# Patient Record
Sex: Male | Born: 1935
Health system: Southern US, Community
[De-identification: ages and names within clinical notes are randomized; demographics above are authoritative.]

## PROBLEM LIST (undated history)

## (undated) DIAGNOSIS — E785 Hyperlipidemia, unspecified: Secondary | ICD-10-CM

## (undated) DIAGNOSIS — I10 Essential (primary) hypertension: Secondary | ICD-10-CM

## (undated) DIAGNOSIS — N183 Chronic kidney disease, stage 3 unspecified: Secondary | ICD-10-CM

## (undated) DIAGNOSIS — N390 Urinary tract infection, site not specified: Secondary | ICD-10-CM

## (undated) DIAGNOSIS — C49A4 Gastrointestinal stromal tumor of large intestine: Secondary | ICD-10-CM

## (undated) DIAGNOSIS — R578 Other shock: Secondary | ICD-10-CM

## (undated) DIAGNOSIS — J9601 Acute respiratory failure with hypoxia: Secondary | ICD-10-CM

## (undated) DIAGNOSIS — R0602 Shortness of breath: Secondary | ICD-10-CM

## (undated) DIAGNOSIS — R1900 Intra-abdominal and pelvic swelling, mass and lump, unspecified site: Secondary | ICD-10-CM

## (undated) DIAGNOSIS — I2510 Atherosclerotic heart disease of native coronary artery without angina pectoris: Secondary | ICD-10-CM

## (undated) DIAGNOSIS — I214 Non-ST elevation (NSTEMI) myocardial infarction: Secondary | ICD-10-CM

## (undated) DIAGNOSIS — C49A0 Gastrointestinal stromal tumor, unspecified site: Secondary | ICD-10-CM

## (undated) DIAGNOSIS — K661 Hemoperitoneum: Secondary | ICD-10-CM

## (undated) DIAGNOSIS — J189 Pneumonia, unspecified organism: Secondary | ICD-10-CM

## (undated) HISTORY — DX: Gastrointestinal stromal tumor, unspecified site: C49.A0

## (undated) HISTORY — DX: Intra-abdominal and pelvic swelling, mass and lump, unspecified site: R19.00

## (undated) HISTORY — DX: Pneumonia, unspecified organism: J18.9

## (undated) HISTORY — DX: Atherosclerotic heart disease of native coronary artery without angina pectoris: I25.10

## (undated) HISTORY — DX: Non-ST elevation (NSTEMI) myocardial infarction: I21.4

## (undated) HISTORY — DX: Urinary tract infection, site not specified: N39.0

---

## 1960-12-19 HISTORY — PX: CYST EXCISION: SHX5701

## 2001-03-01 ENCOUNTER — Emergency Department (HOSPITAL_COMMUNITY): Admission: EM | Admit: 2001-03-01 | Discharge: 2001-03-01 | Payer: Self-pay | Admitting: Emergency Medicine

## 2001-03-26 ENCOUNTER — Encounter: Payer: Self-pay | Admitting: Pediatrics

## 2001-03-26 ENCOUNTER — Encounter: Payer: Self-pay | Admitting: Emergency Medicine

## 2001-03-26 ENCOUNTER — Inpatient Hospital Stay (HOSPITAL_COMMUNITY): Admission: EM | Admit: 2001-03-26 | Discharge: 2001-03-28 | Payer: Self-pay | Admitting: Emergency Medicine

## 2001-03-27 ENCOUNTER — Encounter: Payer: Self-pay | Admitting: Pediatrics

## 2012-02-15 DIAGNOSIS — Z1331 Encounter for screening for depression: Secondary | ICD-10-CM | POA: Diagnosis not present

## 2012-02-15 DIAGNOSIS — N529 Male erectile dysfunction, unspecified: Secondary | ICD-10-CM | POA: Diagnosis not present

## 2012-02-15 DIAGNOSIS — I10 Essential (primary) hypertension: Secondary | ICD-10-CM | POA: Diagnosis not present

## 2012-02-15 DIAGNOSIS — E782 Mixed hyperlipidemia: Secondary | ICD-10-CM | POA: Diagnosis not present

## 2012-05-31 DIAGNOSIS — H2510 Age-related nuclear cataract, unspecified eye: Secondary | ICD-10-CM | POA: Diagnosis not present

## 2012-06-13 DIAGNOSIS — H2510 Age-related nuclear cataract, unspecified eye: Secondary | ICD-10-CM | POA: Diagnosis not present

## 2012-06-20 DIAGNOSIS — H2510 Age-related nuclear cataract, unspecified eye: Secondary | ICD-10-CM | POA: Diagnosis not present

## 2012-06-20 DIAGNOSIS — H52209 Unspecified astigmatism, unspecified eye: Secondary | ICD-10-CM | POA: Diagnosis not present

## 2012-07-04 DIAGNOSIS — H2510 Age-related nuclear cataract, unspecified eye: Secondary | ICD-10-CM | POA: Diagnosis not present

## 2012-08-06 DIAGNOSIS — E782 Mixed hyperlipidemia: Secondary | ICD-10-CM | POA: Diagnosis not present

## 2012-08-06 DIAGNOSIS — I10 Essential (primary) hypertension: Secondary | ICD-10-CM | POA: Diagnosis not present

## 2012-11-05 DIAGNOSIS — Z961 Presence of intraocular lens: Secondary | ICD-10-CM | POA: Diagnosis not present

## 2012-11-21 DIAGNOSIS — L280 Lichen simplex chronicus: Secondary | ICD-10-CM | POA: Diagnosis not present

## 2012-11-21 DIAGNOSIS — D235 Other benign neoplasm of skin of trunk: Secondary | ICD-10-CM | POA: Diagnosis not present

## 2012-11-21 DIAGNOSIS — C44519 Basal cell carcinoma of skin of other part of trunk: Secondary | ICD-10-CM | POA: Diagnosis not present

## 2012-11-21 DIAGNOSIS — L570 Actinic keratosis: Secondary | ICD-10-CM | POA: Diagnosis not present

## 2013-01-01 DIAGNOSIS — Z85828 Personal history of other malignant neoplasm of skin: Secondary | ICD-10-CM | POA: Diagnosis not present

## 2013-02-04 DIAGNOSIS — I10 Essential (primary) hypertension: Secondary | ICD-10-CM | POA: Diagnosis not present

## 2013-02-04 DIAGNOSIS — E782 Mixed hyperlipidemia: Secondary | ICD-10-CM | POA: Diagnosis not present

## 2013-02-16 DIAGNOSIS — I214 Non-ST elevation (NSTEMI) myocardial infarction: Secondary | ICD-10-CM

## 2013-02-16 DIAGNOSIS — R1900 Intra-abdominal and pelvic swelling, mass and lump, unspecified site: Secondary | ICD-10-CM

## 2013-02-16 HISTORY — DX: Non-ST elevation (NSTEMI) myocardial infarction: I21.4

## 2013-02-16 HISTORY — DX: Intra-abdominal and pelvic swelling, mass and lump, unspecified site: R19.00

## 2013-02-27 ENCOUNTER — Emergency Department (HOSPITAL_COMMUNITY): Payer: Medicare Other

## 2013-02-27 ENCOUNTER — Inpatient Hospital Stay (HOSPITAL_COMMUNITY)
Admission: EM | Admit: 2013-02-27 | Discharge: 2013-03-06 | DRG: 280 | Disposition: A | Discharge status: Home / Self Care | Payer: Medicare Other | Attending: Internal Medicine | Admitting: Internal Medicine

## 2013-02-27 ENCOUNTER — Encounter (HOSPITAL_COMMUNITY): Payer: Self-pay | Admitting: Emergency Medicine

## 2013-02-27 DIAGNOSIS — I959 Hypotension, unspecified: Secondary | ICD-10-CM | POA: Diagnosis present

## 2013-02-27 DIAGNOSIS — Z23 Encounter for immunization: Secondary | ICD-10-CM

## 2013-02-27 DIAGNOSIS — R509 Fever, unspecified: Secondary | ICD-10-CM | POA: Diagnosis not present

## 2013-02-27 DIAGNOSIS — N289 Disorder of kidney and ureter, unspecified: Secondary | ICD-10-CM | POA: Diagnosis present

## 2013-02-27 DIAGNOSIS — N390 Urinary tract infection, site not specified: Secondary | ICD-10-CM | POA: Diagnosis not present

## 2013-02-27 DIAGNOSIS — E785 Hyperlipidemia, unspecified: Secondary | ICD-10-CM | POA: Diagnosis present

## 2013-02-27 DIAGNOSIS — R1903 Right lower quadrant abdominal swelling, mass and lump: Secondary | ICD-10-CM | POA: Diagnosis present

## 2013-02-27 DIAGNOSIS — N179 Acute kidney failure, unspecified: Secondary | ICD-10-CM | POA: Diagnosis present

## 2013-02-27 DIAGNOSIS — I214 Non-ST elevation (NSTEMI) myocardial infarction: Principal | ICD-10-CM | POA: Diagnosis present

## 2013-02-27 DIAGNOSIS — K319 Disease of stomach and duodenum, unspecified: Secondary | ICD-10-CM | POA: Diagnosis not present

## 2013-02-27 DIAGNOSIS — I2510 Atherosclerotic heart disease of native coronary artery without angina pectoris: Secondary | ICD-10-CM | POA: Diagnosis present

## 2013-02-27 DIAGNOSIS — I10 Essential (primary) hypertension: Secondary | ICD-10-CM | POA: Diagnosis present

## 2013-02-27 DIAGNOSIS — S298XXA Other specified injuries of thorax, initial encounter: Secondary | ICD-10-CM | POA: Diagnosis not present

## 2013-02-27 DIAGNOSIS — R55 Syncope and collapse: Secondary | ICD-10-CM | POA: Diagnosis not present

## 2013-02-27 DIAGNOSIS — R1909 Other intra-abdominal and pelvic swelling, mass and lump: Secondary | ICD-10-CM | POA: Diagnosis not present

## 2013-02-27 DIAGNOSIS — I517 Cardiomegaly: Secondary | ICD-10-CM | POA: Diagnosis not present

## 2013-02-27 DIAGNOSIS — R42 Dizziness and giddiness: Secondary | ICD-10-CM | POA: Diagnosis not present

## 2013-02-27 DIAGNOSIS — Z79899 Other long term (current) drug therapy: Secondary | ICD-10-CM

## 2013-02-27 DIAGNOSIS — Z7982 Long term (current) use of aspirin: Secondary | ICD-10-CM | POA: Diagnosis not present

## 2013-02-27 DIAGNOSIS — R1900 Intra-abdominal and pelvic swelling, mass and lump, unspecified site: Secondary | ICD-10-CM | POA: Diagnosis not present

## 2013-02-27 DIAGNOSIS — R404 Transient alteration of awareness: Secondary | ICD-10-CM | POA: Diagnosis not present

## 2013-02-27 DIAGNOSIS — J189 Pneumonia, unspecified organism: Secondary | ICD-10-CM | POA: Diagnosis present

## 2013-02-27 DIAGNOSIS — D649 Anemia, unspecified: Secondary | ICD-10-CM | POA: Diagnosis present

## 2013-02-27 DIAGNOSIS — R7989 Other specified abnormal findings of blood chemistry: Secondary | ICD-10-CM | POA: Diagnosis not present

## 2013-02-27 DIAGNOSIS — R5381 Other malaise: Secondary | ICD-10-CM | POA: Diagnosis not present

## 2013-02-27 DIAGNOSIS — D487 Neoplasm of uncertain behavior of other specified sites: Secondary | ICD-10-CM | POA: Diagnosis not present

## 2013-02-27 HISTORY — DX: Hyperlipidemia, unspecified: E78.5

## 2013-02-27 HISTORY — DX: Essential (primary) hypertension: I10

## 2013-02-27 LAB — CBC WITH DIFFERENTIAL/PLATELET
Eosinophils Absolute: 0 10*3/uL (ref 0.0–0.7)
Eosinophils Relative: 0 % (ref 0–5)
Hemoglobin: 8.9 g/dL — ABNORMAL LOW (ref 13.0–17.0)
Lymphocytes Relative: 3 % — ABNORMAL LOW (ref 12–46)
Lymphs Abs: 0.4 10*3/uL — ABNORMAL LOW (ref 0.7–4.0)
MCH: 30.4 pg (ref 26.0–34.0)
MCV: 90.4 fL (ref 78.0–100.0)
Monocytes Relative: 9 % (ref 3–12)
Platelets: 237 10*3/uL (ref 150–400)
RBC: 2.93 MIL/uL — ABNORMAL LOW (ref 4.22–5.81)
WBC: 14 10*3/uL — ABNORMAL HIGH (ref 4.0–10.5)

## 2013-02-27 LAB — URINE MICROSCOPIC-ADD ON

## 2013-02-27 LAB — COMPREHENSIVE METABOLIC PANEL
AST: 23 U/L (ref 0–37)
Albumin: 3.3 g/dL — ABNORMAL LOW (ref 3.5–5.2)
BUN: 25 mg/dL — ABNORMAL HIGH (ref 6–23)
Calcium: 8.6 mg/dL (ref 8.4–10.5)
Creatinine, Ser: 1.51 mg/dL — ABNORMAL HIGH (ref 0.50–1.35)
Total Bilirubin: 0.5 mg/dL (ref 0.3–1.2)
Total Protein: 6.7 g/dL (ref 6.0–8.3)

## 2013-02-27 LAB — URINALYSIS, ROUTINE W REFLEX MICROSCOPIC
Glucose, UA: 100 mg/dL — AB
Hgb urine dipstick: NEGATIVE
Ketones, ur: 15 mg/dL — AB
pH: 5 (ref 5.0–8.0)

## 2013-02-27 LAB — OCCULT BLOOD, POC DEVICE: Fecal Occult Bld: NEGATIVE

## 2013-02-27 LAB — LIPASE, BLOOD: Lipase: 26 U/L (ref 11–59)

## 2013-02-27 MED ORDER — ACETAMINOPHEN 325 MG PO TABS
650.0000 mg | ORAL_TABLET | Freq: Once | ORAL | Status: AC
  Administered 2013-02-27: 650 mg via ORAL
  Filled 2013-02-27: qty 2

## 2013-02-27 MED ORDER — IOHEXOL 300 MG/ML  SOLN
50.0000 mL | Freq: Once | INTRAMUSCULAR | Status: AC | PRN
  Administered 2013-02-27: 50 mL via ORAL

## 2013-02-27 MED ORDER — SODIUM CHLORIDE 0.9 % IV BOLUS (SEPSIS)
1000.0000 mL | Freq: Once | INTRAVENOUS | Status: AC
  Administered 2013-02-27: 1000 mL via INTRAVENOUS

## 2013-02-27 MED ORDER — IOHEXOL 300 MG/ML  SOLN
100.0000 mL | Freq: Once | INTRAMUSCULAR | Status: AC | PRN
  Administered 2013-02-27: 100 mL via INTRAVENOUS

## 2013-02-27 NOTE — ED Notes (Signed)
Radiology outside room for portable CXR

## 2013-02-27 NOTE — H&P (Signed)
PCP:   Lenora Boys, MD   Chief Complaint:  Fever/chills  HPI: 79 yo healthy male comes in today after experiencing some weakness and dizziness earlier today, went home and started having high fevers and chills.  This persisted for several hours so came to ED.  He denies any n/v/d.  No cough.  No dysuria or hematuria.  He has been having some very mild rlq abd pain which is the only thing new.  No cp.  No sob.  No rashes.  No headache.  No sick contacts.  No brbpr or melena.  Review of Systems:  Positive and negative as per HPI otherwise all other systems are negative  Past Medical History: Past Medical History  Diagnosis Date  . Hypertension   . Hyperlipidemia    History reviewed. No pertinent past surgical history.  Medications: Prior to Admission medications   Medication Sig Start Date End Date Taking? Authorizing Provider  amLODipine-benazepril (LOTREL) 10-20 MG per capsule Take 1 capsule by mouth daily.   Yes Historical Provider, MD  aspirin 81 MG chewable tablet Chew 81 mg by mouth daily.   Yes Historical Provider, MD  carvedilol (COREG) 25 MG tablet Take 12.5 mg by mouth 2 (two) times daily with a meal.   Yes Historical Provider, MD  hydrochlorothiazide (HYDRODIURIL) 25 MG tablet Take 25 mg by mouth daily.   Yes Historical Provider, MD  simvastatin (ZOCOR) 20 MG tablet Take 20 mg by mouth every evening.   Yes Historical Provider, MD    Allergies:  No Known Allergies  Social History: Neg x 3.  Lives at home with wife, who is here now along with his son.  Pt is full code but does not want prolonged extreme measures in the future.  Family History: negative  Physical Exam: Filed Vitals:   02/27/13 2215 02/27/13 2230 02/27/13 2245 02/27/13 2335  BP: 100/46 98/48 100/49 113/49  Pulse: 100 90 89 95  Temp:    98.9 F (37.2 C)  TempSrc:    Oral  Resp: 21 21 20 19   SpO2: 95% 95% 96% 96%   General appearance: alert, cooperative and no distress Head: Normocephalic,  without obvious abnormality, atraumatic Eyes: negative Nose: Nares normal. Septum midline. Mucosa normal. No drainage or sinus tenderness. Neck: no JVD and supple, symmetrical, trachea midline Lungs: clear to auscultation bilaterally Heart: regular rate and rhythm, S1, S2 normal, no murmur, click, rub or gallop Abdomen: soft, non-tender; bowel sounds normal; no masses,  no organomegaly Extremities: extremities normal, atraumatic, no cyanosis or edema Pulses: 2+ and symmetric Skin: Skin color, texture, turgor normal. No rashes or lesions Neurologic: Grossly normal    Labs on Admission:   Recent Labs  02/27/13 2022  NA 141  K 3.5  CL 104  CO2 24  GLUCOSE 135*  BUN 25*  CREATININE 1.51*  CALCIUM 8.6    Recent Labs  02/27/13 2022  AST 23  ALT 20  ALKPHOS 70  BILITOT 0.5  PROT 6.7  ALBUMIN 3.3*    Recent Labs  02/27/13 2022  LIPASE 26    Recent Labs  02/27/13 2022  WBC 14.0*  NEUTROABS 12.3*  HGB 8.9*  HCT 26.5*  MCV 90.4  PLT 237    Recent Labs  02/27/13 2022  TROPONINI 0.98*   Radiological Exams on Admission: Ct Abdomen Pelvis W Contrast  02/27/2013  *RADIOLOGY REPORT*  Clinical Data: Fever, right lower quadrant pain.  CT ABDOMEN AND PELVIS WITH CONTRAST  Technique:  Multidetector CT  imaging of the abdomen and pelvis was performed following the standard protocol during bolus administration of intravenous contrast.  Contrast: OMNIPAQUE IOHEXOL 300 MG/ML  SOLN  Comparison: None.  Findings: Minimal scarring or atelectasis within the lower lungs. Heart size upper normal to mildly enlarged.  Coronary artery calcification.  Unremarkable liver, biliary system, spleen, pancreas, adrenal glands.  Symmetric renal enhancement.  No hydronephrosis or hydroureter.  Colonic diverticulosis.  No CT evidence for colitis or diverticulitis.  Normal appendix.  Small hiatal hernia.  6.2 x 5.7 cm heterogeneous attenuation mass within the right anterior lower abdomen,  intimately associated with a proximal ileal loop of small bowel.  There is mild adjacent stranding of the mesenteric fat and thickening of the anterior peritoneum as seen on images 67 - 69 as index.  No free intraperitoneal air or fluid.  No lymphadenopathy.  There is scattered atherosclerotic calcification of the aorta and its branches. No aneurysmal dilatation.  Partially decompressed bladder.  Fat containing left inguinal hernia.  Multilevel degenerative changes of the imaged spine. No acute or aggressive appearing osseous lesion.  IMPRESSION: 6.2 x 5.7 cm heterogeneous attenuation mass within the right lower abdomen, intimately associated with a loop of proximal ileum. Internal areas of low attenuation favored to reflect necrosis. Favored differential includes a small bowel stromal tumor, metastatic disease, or lymphoma. Recommend tissue sampling.  Adjacent mesenteric fat stranding and peritoneal thickening may reflect local spread or superimposed infection.   Original Report Authenticated By: Jearld Lesch, M.D.    Dg Chest Port 1 View  02/27/2013  *RADIOLOGY REPORT*  Clinical Data: Fever, fall  PORTABLE CHEST - 1 VIEW  Comparison: Prior chest x-ray 11/09/2011  Findings: Left retrocardiac opacity.  Given portable frontal technique, cardiac and mediastinal contours remain within normal limits.  Aortic atherosclerosis noted.  No effusion, or pneumothorax.  No edema.  No acute osseous abnormality.  IMPRESSION: Left retrocardiac opacity may reflect atelectasis and / or infiltrate.   Original Report Authenticated By: Malachy Moan, M.D.     Assessment/Plan 79 yo male with fever/chills, new rlq abd pain, new mass rlq ct scan, with nstemi and microcytic anemia and mild renal failure  Principal Problem:   Fever chills Active Problems:   NSTEMI (non-ST elevated myocardial infarction)   Abdominal mass, RLQ (right lower quadrant)   Anemia   Dizzy   Renal insufficiency, mild  Source of infection  unclear.  Place on broad spectrum abx vanc/unasyn.  Anemia w/u. Pt was heme neg on EDP exam.  Blood cultures pending.  Trop elevated likely nstemi from demand ischemia, cardiology called to evaluate pt.  Will serial enzymes and ck 2 d echo in am.  Asa.  Further recs per cards.  Will ck procalcitonin level.  Will need GI call in am for eventual w/u of this bowel mass.  Full code.  DAVID,RACHAL A 02/27/2013, 11:51 PM

## 2013-02-27 NOTE — ED Notes (Signed)
RN called lab, informed to run troponin STAT

## 2013-02-27 NOTE — ED Notes (Signed)
EMT at bedside collecting labs for analysis

## 2013-02-27 NOTE — ED Notes (Signed)
Primary RN noticed EKG changes, RN shot a new EKG to compare with EKG shot upon pt arrival. EKG's given to EDP Pollina. Per Dr. Blinda Leatherwood, Pt has significant EKG changes. MD Pollina at bedside with pt. RN ordered add on troponin for lab values.

## 2013-02-27 NOTE — ED Notes (Signed)
Per EMS, Pt has been reporting chills since this morning, and a tightness to abdomen this morning. Pt denies N/V. Pt denies CP at this time. Earlier this afternoon pt was at [facility] and had a near syncopal episode. Pt reports that he got diizy and weak, and family assisted him to the ground. Pt went to UC and had more episodes of chills. Pt temp at UC was 102 and HR in 130's. Pt 12 lead showed inverted T waves at UC. UC gave pt 324 asa. Pt is 94% on RA, and denies SOB at this time. Pt's only complaint at this time is general weakness. Pt CBG 138

## 2013-02-27 NOTE — ED Notes (Signed)
Phlebotomy at bedside.

## 2013-02-27 NOTE — ED Notes (Signed)
Pt ambulated to restroom for urine specimen.

## 2013-02-27 NOTE — ED Notes (Signed)
Troponin Value 0.98. Dr. Bernette Mayers notified

## 2013-02-27 NOTE — ED Provider Notes (Signed)
History     CSN: [id number]  Arrival date & time 02/27/13  1478   First MD Initiated Contact with Patient 02/27/13 2012      Chief Complaint  Patient presents with  . Near Syncope    (Consider location/radiation/quality/duration/timing/severity/associated sxs/prior treatment) HPI Pt reports he was feeling weak earlier today while at [facility] and was helped to the floor. Denies LOC, but felt like he was going to pass out. He went home after recovering and reports he felt chilled and generally weak all day. He did not have any CP or SOB. He went to local Urgent care where he was found to be febrile and tachycardic, advised to come to the ED for eval. He denies any recent URI symptoms, no cough, SOB. No nausea vomiting or diarrhea, no dark stools, no dysuria or hematuria. He reports some moderate RLQ abdominal pain, he attributes to changing car tires a few days ago.   Past Medical History  Diagnosis Date  . Hypertension   . Hyperlipidemia     History reviewed. No pertinent past surgical history.  No family history on file.  History  Substance Use Topics  . Smoking status: Not on file  . Smokeless tobacco: Not on file  . Alcohol Use: Not on file      Review of Systems All other systems reviewed and are negative except as noted in HPI.   Allergies  Review of patient's allergies indicates no known allergies.  Home Medications   Current Outpatient Rx  Name  Route  Sig  Dispense  Refill  . amLODipine-benazepril (LOTREL) 10-20 MG per capsule   Oral   Take 1 capsule by mouth daily.         [name] aspirin 81 MG chewable tablet   Oral   Chew 81 mg by mouth daily.         . carvedilol (COREG) 25 MG tablet   Oral   Take 12.5 mg by mouth 2 (two) times daily with a meal.         . hydrochlorothiazide (HYDRODIURIL) 25 MG tablet   Oral   Take 25 mg by mouth daily.         . simvastatin (ZOCOR) 20 MG tablet   Oral   Take 20 mg by mouth every evening.            BP 104/52  Pulse 90  Temp(Src) 98.9 F (37.2 C) (Oral)  Resp 22  SpO2 95%  Physical Exam  Nursing note and vitals reviewed. Constitutional: He is oriented to person, place, and time. He appears well-developed and well-nourished.  HENT:  Head: Normocephalic and atraumatic.  Eyes: EOM are normal. Pupils are equal, round, and reactive to light.  Neck: Normal range of motion. Neck supple.  Cardiovascular: Normal rate, normal heart sounds and intact distal pulses.   Pulmonary/Chest: Effort normal and breath sounds normal.  Abdominal: Bowel sounds are normal. He exhibits no distension. There is tenderness (RLQ). There is guarding. There is no rebound.  Musculoskeletal: Normal range of motion. He exhibits no edema and no tenderness.  Neurological: He is alert and oriented to person, place, and time. He has normal strength. No cranial nerve deficit or sensory deficit.  Skin: Skin is warm and dry. No rash noted.  Psychiatric: He has a normal mood and affect.    ED Course  Procedures (including critical care time)  Labs Reviewed  COMPREHENSIVE METABOLIC PANEL - Abnormal; Notable for the following:    Glucose, Bld  135 (*)    BUN 25 (*)    Creatinine, Ser 1.51 (*)    Albumin 3.3 (*)    GFR calc non Af Amer 42 (*)    GFR calc Af Amer 48 (*)    All other components within normal limits  CBC WITH DIFFERENTIAL - Abnormal; Notable for the following:    WBC 14.0 (*)    RBC 2.93 (*)    Hemoglobin 8.9 (*)    HCT 26.5 (*)    Neutrophils Relative 88 (*)    Neutro Abs 12.3 (*)    Lymphocytes Relative 3 (*)    Lymphs Abs 0.4 (*)    Monocytes Absolute 1.2 (*)    All other components within normal limits  URINALYSIS, ROUTINE W REFLEX MICROSCOPIC - Abnormal; Notable for the following:    Glucose, UA 100 (*)    Bilirubin Urine SMALL (*)    Ketones, ur 15 (*)    Leukocytes, UA TRACE (*)    All other components within normal limits  TROPONIN I - Abnormal; Notable for the following:     Troponin I 0.98 (*)    All other components within normal limits  CULTURE, BLOOD (ROUTINE X 2)  CULTURE, BLOOD (ROUTINE X 2)  LIPASE, BLOOD  URINE MICROSCOPIC-ADD ON  CG4 I-STAT (LACTIC ACID)  OCCULT BLOOD, POC DEVICE  TYPE AND SCREEN  ABO/RH   Ct Abdomen Pelvis W Contrast  02/27/2013  *RADIOLOGY REPORT*  Clinical Data: Fever, right lower quadrant pain.  CT ABDOMEN AND PELVIS WITH CONTRAST  Technique:  Multidetector CT imaging of the abdomen and pelvis was performed following the standard protocol during bolus administration of intravenous contrast.  Contrast: OMNIPAQUE IOHEXOL 300 MG/ML  SOLN  Comparison: None.  Findings: Minimal scarring or atelectasis within the lower lungs. Heart size upper normal to mildly enlarged.  Coronary artery calcification.  Unremarkable liver, biliary system, spleen, pancreas, adrenal glands.  Symmetric renal enhancement.  No hydronephrosis or hydroureter.  Colonic diverticulosis.  No CT evidence for colitis or diverticulitis.  Normal appendix.  Small hiatal hernia.  6.2 x 5.7 cm heterogeneous attenuation mass within the right anterior lower abdomen, intimately associated with a proximal ileal loop of small bowel.  There is mild adjacent stranding of the mesenteric fat and thickening of the anterior peritoneum as seen on images 67 - 69 as index.  No free intraperitoneal air or fluid.  No lymphadenopathy.  There is scattered atherosclerotic calcification of the aorta and its branches. No aneurysmal dilatation.  Partially decompressed bladder.  Fat containing left inguinal hernia.  Multilevel degenerative changes of the imaged spine. No acute or aggressive appearing osseous lesion.  IMPRESSION: 6.2 x 5.7 cm heterogeneous attenuation mass within the right lower abdomen, intimately associated with a loop of proximal ileum. Internal areas of low attenuation favored to reflect necrosis. Favored differential includes a small bowel stromal tumor, metastatic disease, or  lymphoma. Recommend tissue sampling.  Adjacent mesenteric fat stranding and peritoneal thickening may reflect local spread or superimposed infection.   Original Report Authenticated By: Jearld Lesch, M.D.    Dg Chest Port 1 View  02/27/2013  *RADIOLOGY REPORT*  Clinical Data: Fever, fall  PORTABLE CHEST - 1 VIEW  Comparison: Prior chest x-ray 11/09/2011  Findings: Left retrocardiac opacity.  Given portable frontal technique, cardiac and mediastinal contours remain within normal limits.  Aortic atherosclerosis noted.  No effusion, or pneumothorax.  No edema.  No acute osseous abnormality.  IMPRESSION: Left retrocardiac opacity may reflect atelectasis  and / or infiltrate.   Original Report Authenticated By: Malachy Moan, M.D.      1. Fever of unknown origin   2. Non-STEMI (non-ST elevated myocardial infarction)   3. Abdominal mass       MDM   Date: 02/27/2013 1940   Rate: 104  Rhythm: sinus tachycardia  QRS Axis: normal  Intervals: normal  ST/T Wave abnormalities: nonspecific T wave changes  Conduction Disutrbances:none  Narrative Interpretation:   Old EKG Reviewed: none available  Pt febrile in the ED and tender in RLQ. Suspect infectious source for his general weakness. Concern for acute appendicitis. Labs and CT ordered.   Nursing reports a change in monitor tracing and did another EKG as below now shows inferolateral ST depressions. Troponin added and positive. Discussed with Cardiologist on call for consult pending the remainder of his workup. Will hold on heparin now until CT is done. He is also anemic with no clear reason. He is heme neg, normal color stool   Date: 02/27/2013 2125  Rate: 100  Rhythm: normal sinus rhythm  QRS Axis: normal  Intervals: normal  ST/T Wave abnormalities: ST depressions inferiorly and ST depressions laterally  Conduction Disutrbances:none  Narrative Interpretation:   Old EKG Reviewed: changes noted   Reviewed CT images with radiologist.  Mass seen of unclear etiology or relation to his fever, weakness and cardiac findings. Discussed with Dr. Onalee Hua who will admit. Vanc/Unasyn ordered to cover fever with unknown source, possibly related to abdominal mass. Cards has not yet seen the patient. He remains pain free.           Charles B. Bernette Mayers, MD 02/27/13 4540

## 2013-02-27 NOTE — ED Notes (Signed)
Pt transported to CT ?

## 2013-02-27 NOTE — ED Notes (Signed)
CT paged. 

## 2013-02-27 NOTE — ED Notes (Signed)
EDP Bernette Mayers at bedside performing rectal exam

## 2013-02-27 NOTE — ED Notes (Signed)
MD at bedside. 

## 2013-02-28 DIAGNOSIS — D649 Anemia, unspecified: Secondary | ICD-10-CM

## 2013-02-28 DIAGNOSIS — R1903 Right lower quadrant abdominal swelling, mass and lump: Secondary | ICD-10-CM

## 2013-02-28 DIAGNOSIS — N179 Acute kidney failure, unspecified: Secondary | ICD-10-CM

## 2013-02-28 DIAGNOSIS — I517 Cardiomegaly: Secondary | ICD-10-CM

## 2013-02-28 DIAGNOSIS — R7989 Other specified abnormal findings of blood chemistry: Secondary | ICD-10-CM

## 2013-02-28 DIAGNOSIS — N390 Urinary tract infection, site not specified: Secondary | ICD-10-CM | POA: Diagnosis present

## 2013-02-28 DIAGNOSIS — J189 Pneumonia, unspecified organism: Secondary | ICD-10-CM | POA: Diagnosis present

## 2013-02-28 DIAGNOSIS — R55 Syncope and collapse: Secondary | ICD-10-CM | POA: Diagnosis present

## 2013-02-28 LAB — IRON AND TIBC

## 2013-02-28 LAB — FOLATE: Folate: 12.8 ng/mL

## 2013-02-28 LAB — RETICULOCYTES: RBC.: 2.68 MIL/uL — ABNORMAL LOW (ref 4.22–5.81)

## 2013-02-28 LAB — ABO/RH: ABO/RH(D): O NEG

## 2013-02-28 LAB — COMPREHENSIVE METABOLIC PANEL
CO2: 23 mEq/L (ref 19–32)
Calcium: 8 mg/dL — ABNORMAL LOW (ref 8.4–10.5)
Creatinine, Ser: 1.49 mg/dL — ABNORMAL HIGH (ref 0.50–1.35)
GFR calc Af Amer: 49 mL/min — ABNORMAL LOW (ref >90)
GFR calc non Af Amer: 42 mL/min — ABNORMAL LOW (ref >90)
Glucose, Bld: 124 mg/dL — ABNORMAL HIGH (ref 70–99)

## 2013-02-28 LAB — PROCALCITONIN: Procalcitonin: 1.26 ng/mL

## 2013-02-28 LAB — VITAMIN B12: Vitamin B-12: 240 pg/mL (ref 211–911)

## 2013-02-28 LAB — CBC
Hemoglobin: 8.3 g/dL — ABNORMAL LOW (ref 13.0–17.0)
RBC: 2.67 MIL/uL — ABNORMAL LOW (ref 4.22–5.81)
WBC: 8.9 10*3/uL (ref 4.0–10.5)

## 2013-02-28 MED ORDER — DIPHENHYDRAMINE HCL 12.5 MG/5ML PO ELIX
12.5000 mg | ORAL_SOLUTION | Freq: Every evening | ORAL | Status: DC | PRN
  Administered 2013-02-28: 12.5 mg via ORAL
  Filled 2013-02-28: qty 5

## 2013-02-28 MED ORDER — MORPHINE SULFATE 2 MG/ML IJ SOLN: 1.0000 mg | INTRAMUSCULAR | Status: DC | PRN

## 2013-02-28 MED ORDER — SIMVASTATIN 20 MG PO TABS
20.0000 mg | ORAL_TABLET | Freq: Every evening | ORAL | Status: DC
  Administered 2013-02-28 – 2013-03-05 (×6): 20 mg via ORAL
  Filled 2013-02-28 (×7): qty 1

## 2013-02-28 MED ORDER — SODIUM CHLORIDE 0.9 % IJ SOLN
3.0000 mL | Freq: Two times a day (BID) | INTRAMUSCULAR | Status: DC
  Administered 2013-02-28 – 2013-03-05 (×7): 3 mL via INTRAVENOUS

## 2013-02-28 MED ORDER — PIPERACILLIN-TAZOBACTAM 3.375 G IVPB
3.3750 g | Freq: Three times a day (TID) | INTRAVENOUS | Status: DC
  Administered 2013-02-28 – 2013-03-02 (×6): 3.375 g via INTRAVENOUS
  Filled 2013-02-28 (×8): qty 50

## 2013-02-28 MED ORDER — ONDANSETRON HCL 4 MG/2ML IJ SOLN: 4.0000 mg | Freq: Four times a day (QID) | INTRAMUSCULAR | Status: DC | PRN

## 2013-02-28 MED ORDER — SODIUM CHLORIDE 0.9 % IV SOLN
INTRAVENOUS | Status: AC
  Administered 2013-02-28: 17:00:00 via INTRAVENOUS

## 2013-02-28 MED ORDER — ACETAMINOPHEN 325 MG PO TABS: 650.0000 mg | ORAL_TABLET | Freq: Four times a day (QID) | ORAL | Status: DC | PRN

## 2013-02-28 MED ORDER — ACETAMINOPHEN 325 MG PO TABS
650.0000 mg | ORAL_TABLET | Freq: Once | ORAL | Status: AC
  Administered 2013-02-28: 650 mg via ORAL
  Filled 2013-02-28: qty 2

## 2013-02-28 MED ORDER — ONDANSETRON HCL 4 MG PO TABS: 4.0000 mg | ORAL_TABLET | Freq: Four times a day (QID) | ORAL | Status: DC | PRN

## 2013-02-28 MED ORDER — ASPIRIN 325 MG PO TABS
325.0000 mg | ORAL_TABLET | Freq: Every day | ORAL | Status: DC
  Administered 2013-02-28 – 2013-03-06 (×5): 325 mg via ORAL
  Filled 2013-02-28 (×7): qty 1

## 2013-02-28 MED ORDER — SODIUM CHLORIDE 0.9 % IV SOLN
3.0000 g | Freq: Three times a day (TID) | INTRAVENOUS | Status: DC
  Administered 2013-02-28: 3 g via INTRAVENOUS
  Administered 2013-02-28: 01:00:00 via INTRAVENOUS
  Administered 2013-02-28: 3 g via INTRAVENOUS
  Filled 2013-02-28 (×5): qty 3

## 2013-02-28 MED ORDER — SODIUM CHLORIDE 0.9 % IV SOLN
INTRAVENOUS | Status: AC
  Administered 2013-02-28: 03:00:00 via INTRAVENOUS

## 2013-02-28 MED ORDER — SODIUM CHLORIDE 0.9 % IV SOLN
Freq: Once | INTRAVENOUS | Status: AC
  Administered 2013-02-28: 01:00:00 via INTRAVENOUS

## 2013-02-28 MED ORDER — ASPIRIN 81 MG PO CHEW: 81.0000 mg | CHEWABLE_TABLET | Freq: Every day | ORAL | Status: DC

## 2013-02-28 MED ORDER — VANCOMYCIN HCL 10 G IV SOLR
1500.0000 mg | Freq: Once | INTRAVENOUS | Status: AC
  Administered 2013-02-28: 1500 mg via INTRAVENOUS
  Filled 2013-02-28: qty 1500

## 2013-02-28 MED ORDER — ACETAMINOPHEN 650 MG RE SUPP: 650.0000 mg | Freq: Four times a day (QID) | RECTAL | Status: DC | PRN

## 2013-02-28 MED ORDER — ENOXAPARIN SODIUM 40 MG/0.4ML ~~LOC~~ SOLN
40.0000 mg | SUBCUTANEOUS | Status: DC
  Administered 2013-02-28 – 2013-03-04 (×4): 40 mg via SUBCUTANEOUS
  Filled 2013-02-28 (×6): qty 0.4

## 2013-02-28 MED ORDER — SODIUM CHLORIDE 0.9 % IV SOLN: INTRAVENOUS | Status: AC

## 2013-02-28 MED ORDER — VANCOMYCIN HCL IN DEXTROSE 1-5 GM/200ML-% IV SOLN
1000.0000 mg | INTRAVENOUS | Status: DC
  Administered 2013-03-01 – 2013-03-02 (×2): 1000 mg via INTRAVENOUS
  Filled 2013-02-28 (×2): qty 200

## 2013-02-28 NOTE — ED Notes (Signed)
Admitting physician at bedside

## 2013-02-28 NOTE — Consult Note (Signed)
Cardiology Consult Note [name], [name] Cheadle, [name] [name] No ref. Westen Dinino found  Reason for consult: positive troponin and abnormal ecg  History of Present Illness (and review of medical records): [name] [name] is a 79 y.o. male who presents for evaluation after presyncopal episode.  He has no hx of CAD or prior MI, valvular heart disease, or arrythmia.  He woke up with significant body chills that have persisted all day.  He felt weak and dizzy.  While in [place], he had episode where his family had to lay him down on floor, but had no LOC.  He had no chest pain, shortness of breath, or palpitations.  Upon arrival to ED, he continued to have chills, was febrile 102 with low blood pressures.  Cardiology was consulted due to abnormal ecg and positive troponin.    Review of Systems He reports recent constipation that cleared with OTC meds.  RLQ pain.  No weight loss, no BRBPR. A comprehensive review of systems was negative other than stated in HPI. Patient Active Problem List   Diagnosis Date Noted  . NSTEMI (non-ST elevated myocardial infarction) 02/27/2013  . Fever chills 02/27/2013  . Abdominal mass, RLQ (right lower quadrant) 02/27/2013  . Anemia 02/27/2013  . Dizzy 02/27/2013  . Renal insufficiency, mild 02/27/2013   Past Medical History  Diagnosis Date  . Hypertension   . Hyperlipidemia     History reviewed. No pertinent past surgical history.  Prescriptions prior to admission  Medication Sig Dispense Refill  . amLODipine-benazepril (LOTREL) 10-20 MG per capsule Take 1 capsule by mouth daily.      [name] aspirin 81 MG chewable tablet Chew 81 mg by mouth daily.      . carvedilol (COREG) 25 MG tablet Take 12.5 mg by mouth 2 (two) times daily with a meal.      . hydrochlorothiazide (HYDRODIURIL) 25 MG tablet Take 25 mg by mouth daily.      . simvastatin (ZOCOR) 20 MG tablet Take 20 mg by mouth every evening.       No Known Allergies  History  Substance Use Topics  . Smoking status: Not on file   . Smokeless tobacco: Not on file  . Alcohol Use: Not on file    No family history on file.   Objective: Patient Vitals for the past 8 hrs:  BP Temp Temp src Pulse Resp SpO2 Height Weight  02/28/13 0206 106/46 mmHg 99.1 F (37.3 C) Oral 98 20 94 % 5\' 7"  (1.702 m) 80.604 kg (177 lb 11.2 oz)  02/28/13 0133 103/50 mmHg 99.9 F (37.7 C) Oral 99 23 95 % - -  02/28/13 0110 - 99.6 F (37.6 C) Oral - - - - -  02/28/13 0100 79/29 mmHg - - 99 26 94 % - -  02/28/13 0030 95/36 mmHg - - 99 29 93 % - -  02/28/13 0015 128/99 mmHg - - 120 30 97 % - -  02/28/13 0005 124/107 mmHg - - 119 - - 5\' 7"  (1.702 m) 81.647 kg (180 lb)  02/27/13 2345 101/49 mmHg - - 96 20 93 % - -  02/27/13 2335 113/49 mmHg 98.9 F (37.2 C) Oral 95 19 96 % - -  02/27/13 2245 100/49 mmHg - - 89 20 96 % - -  02/27/13 2230 98/48 mmHg - - 90 21 95 % - -  02/27/13 2215 100/46 mmHg - - 100 21 95 % - -  02/27/13 2200 104/52 mmHg - - 90 22 95 % - -  02/27/13 2145 104/51 mmHg - - 92 20 95 % - -  02/27/13 2120 235/54 mmHg 98.9 F (37.2 C) Oral 108 29 95 % - -  02/27/13 2015 120/54 mmHg - - 103 21 95 % - -  02/27/13 1945 128/48 mmHg - - 104 27 92 % - -  02/27/13 1942 118/48 mmHg 102.8 F (39.3 C) Oral 108 18 98 % - -   General Appearance:    Alert, cooperative, elderly male, appears ill, shivering under several blankets  Head:    Normocephalic, without obvious abnormality, atraumatic  Eyes:     PERRL, EOMI, anicteric sclerae  Neck:   Supple, no carotid bruit or JVD  Lungs:     Clear to auscultation bilaterally, respirations unlabored  Heart:    Regular rate and rhythm, S1 and S2 normal, no murmurs  Abdomen:     Soft, non-tender, normoactive bowel sounds  Extremities:   Extremities normal, atraumatic, no cyanosis or edema  Pulses:   2+ and symmetric all extremities  Skin:   no rashes or lesions, warm  Neurologic:   No focal deficits. AAO x3   Results for orders placed during the hospital encounter of 02/27/13 (from the past 48  hour(s))  COMPREHENSIVE METABOLIC PANEL     Status: Abnormal   Collection Time    02/27/13  8:22 PM      Result Value Range   Sodium 141  135 - 145 mEq/L   Potassium 3.5  3.5 - 5.1 mEq/L   Chloride 104  96 - 112 mEq/L   CO2 24  19 - 32 mEq/L   Glucose, Bld 135 (*) 70 - 99 mg/dL   BUN 25 (*) 6 - 23 mg/dL   Creatinine, Ser 4.54 (*) 0.50 - 1.35 mg/dL   Calcium 8.6  8.4 - 09.8 mg/dL   Total Protein 6.7  6.0 - 8.3 g/dL   Albumin 3.3 (*) 3.5 - 5.2 g/dL   AST 23  0 - 37 U/L   ALT 20  0 - 53 U/L   Alkaline Phosphatase 70  39 - 117 U/L   Total Bilirubin 0.5  0.3 - 1.2 mg/dL   GFR calc non Af Amer 42 (*) >90 mL/min   GFR calc Af Amer 48 (*) >90 mL/min   Comment:            The eGFR has been calculated     using the CKD EPI equation.     This calculation has not been     validated in all clinical     situations.     eGFR's persistently     <90 mL/min signify     possible Chronic Kidney Disease.  CBC WITH DIFFERENTIAL     Status: Abnormal   Collection Time    02/27/13  8:22 PM      Result Value Range   WBC 14.0 (*) 4.0 - 10.5 K/uL   RBC 2.93 (*) 4.22 - 5.81 MIL/uL   Hemoglobin 8.9 (*) 13.0 - 17.0 g/dL   HCT 11.9 (*) 14.7 - 82.9 %   MCV 90.4  78.0 - 100.0 fL   MCH 30.4  26.0 - 34.0 pg   MCHC 33.6  30.0 - 36.0 g/dL   RDW 56.2  13.0 - 86.5 %   Platelets 237  150 - 400 K/uL   Neutrophils Relative 88 (*) 43 - 77 %   Neutro Abs 12.3 (*) 1.7 - 7.7 K/uL   Lymphocytes Relative 3 (*) 12 -  46 %   Lymphs Abs 0.4 (*) 0.7 - 4.0 K/uL   Monocytes Relative 9  3 - 12 %   Monocytes Absolute 1.2 (*) 0.1 - 1.0 K/uL   Eosinophils Relative 0  0 - 5 %   Eosinophils Absolute 0.0  0.0 - 0.7 K/uL   Basophils Relative 0  0 - 1 %   Basophils Absolute 0.0  0.0 - 0.1 K/uL  LIPASE, BLOOD     Status: None   Collection Time    02/27/13  8:22 PM      Result Value Range   Lipase 26  11 - 59 U/L  TROPONIN I     Status: Abnormal   Collection Time    02/27/13  8:22 PM      Result Value Range   Troponin I  0.98 (*) <0.30 ng/mL   Comment:            Due to the release kinetics of cTnI,     a negative result within the first hours     of the onset of symptoms does not rule out     myocardial infarction with certainty.     If myocardial infarction is still suspected,     repeat the test at appropriate intervals.     CRITICAL RESULT CALLED TO, READ BACK BY AND VERIFIED WITH:     J CAMP,RN 02/27/13 WBOND  PROCALCITONIN     Status: None   Collection Time    02/27/13  8:22 PM      Result Value Range   Procalcitonin 1.26     Comment:            Interpretation:     PCT > 0.5 ng/mL and <= 2 ng/mL:     Systemic infection (sepsis) is possible,     but other conditions are known to elevate     PCT as well.     (NOTE)             ICU PCT Algorithm               Non ICU PCT Algorithm        ----------------------------     ------------------------------             PCT < 0.25 ng/mL                 PCT < 0.1 ng/mL         Stopping of antibiotics            Stopping of antibiotics           strongly encouraged.               strongly encouraged.        ----------------------------     ------------------------------           PCT level decrease by               PCT < 0.25 ng/mL           >= 80% from peak PCT           OR PCT 0.25 - 0.5 ng/mL          Stopping of antibiotics  encouraged.         Stopping of antibiotics               encouraged.        ----------------------------     ------------------------------           PCT level decrease by              PCT >= 0.25 ng/mL           < 80% from peak PCT            AND PCT >= 0.5 ng/mL            Continuing antibiotics                                                  encouraged.           Continuing antibiotics                encouraged.        ----------------------------     ------------------------------         PCT level increase compared          PCT > 0.5 ng/mL             with peak PCT AND               PCT >= 0.5 ng/mL             Escalation of antibiotics                                              strongly encouraged.          Escalation of antibiotics            strongly encouraged.  URINALYSIS, ROUTINE W REFLEX MICROSCOPIC     Status: Abnormal   Collection Time    02/27/13  8:23 PM      Result Value Range   Color, Urine YELLOW  YELLOW   APPearance CLEAR  CLEAR   Specific Gravity, Urine 1.027  1.005 - 1.030   pH 5.0  5.0 - 8.0   Glucose, UA 100 (*) NEGATIVE mg/dL   Hgb urine dipstick NEGATIVE  NEGATIVE   Bilirubin Urine SMALL (*) NEGATIVE   Ketones, ur 15 (*) NEGATIVE mg/dL   Protein, ur NEGATIVE  NEGATIVE mg/dL   Urobilinogen, UA 1.0  0.0 - 1.0 mg/dL   Nitrite NEGATIVE  NEGATIVE   Leukocytes, UA TRACE (*) NEGATIVE  URINE MICROSCOPIC-ADD ON     Status: None   Collection Time    02/27/13  8:23 PM      Result Value Range   Squamous Epithelial / LPF RARE  RARE   WBC, UA 0-2  <3 WBC/hpf   RBC / HPF 0-2  <3 RBC/hpf   Bacteria, UA RARE  RARE   Urine-Other MUCOUS PRESENT    CG4 I-STAT (LACTIC ACID)     Status: None   Collection Time    02/27/13  9:11 PM      Result Value Range   Lactic Acid, Venous 0.89  0.5 - 2.2 mmol/L  OCCULT BLOOD, POC DEVICE     Status: None   Collection  Time    02/27/13 10:24 PM      Result Value Range   Fecal Occult Bld NEGATIVE  NEGATIVE  TYPE AND SCREEN     Status: None   Collection Time    02/27/13 10:45 PM      Result Value Range   ABO/RH(D) O NEG     Antibody Screen NEG     Sample Expiration 03/02/2013    ABO/RH     Status: None   Collection Time    02/27/13 10:45 PM      Result Value Range   ABO/RH(D) O NEG     Ct Abdomen Pelvis W Contrast  02/27/2013  *RADIOLOGY REPORT*  Clinical Data: Fever, right lower quadrant pain.  CT ABDOMEN AND PELVIS WITH CONTRAST  Technique:  Multidetector CT imaging of the abdomen and pelvis was performed following the standard protocol during bolus administration of intravenous contrast.   Contrast: OMNIPAQUE IOHEXOL 300 MG/ML  SOLN  Comparison: None.  Findings: Minimal scarring or atelectasis within the lower lungs. Heart size upper normal to mildly enlarged.  Coronary artery calcification.  Unremarkable liver, biliary system, spleen, pancreas, adrenal glands.  Symmetric renal enhancement.  No hydronephrosis or hydroureter.  Colonic diverticulosis.  No CT evidence for colitis or diverticulitis.  Normal appendix.  Small hiatal hernia.  6.2 x 5.7 cm heterogeneous attenuation mass within the right anterior lower abdomen, intimately associated with a proximal ileal loop of small bowel.  There is mild adjacent stranding of the mesenteric fat and thickening of the anterior peritoneum as seen on images 67 - 69 as index.  No free intraperitoneal air or fluid.  No lymphadenopathy.  There is scattered atherosclerotic calcification of the aorta and its branches. No aneurysmal dilatation.  Partially decompressed bladder.  Fat containing left inguinal hernia.  Multilevel degenerative changes of the imaged spine. No acute or aggressive appearing osseous lesion.  IMPRESSION: 6.2 x 5.7 cm heterogeneous attenuation mass within the right lower abdomen, intimately associated with a loop of proximal ileum. Internal areas of low attenuation favored to reflect necrosis. Favored differential includes a small bowel stromal tumor, metastatic disease, or lymphoma. Recommend tissue sampling.  Adjacent mesenteric fat stranding and peritoneal thickening may reflect local spread or superimposed infection.   Original Report Authenticated By: Jearld Lesch, M.D.    Dg Chest Port 1 View  02/27/2013  *RADIOLOGY REPORT*  Clinical Data: Fever, fall  PORTABLE CHEST - 1 VIEW  Comparison: Prior chest x-ray 11/09/2011  Findings: Left retrocardiac opacity.  Given portable frontal technique, cardiac and mediastinal contours remain within normal limits.  Aortic atherosclerosis noted.  No effusion, or pneumothorax.  No edema.  No  acute osseous abnormality.  IMPRESSION: Left retrocardiac opacity may reflect atelectasis and / or infiltrate.   Original Report Authenticated By: Malachy Moan, M.D.     ECG:  Sinus rhythm with inferolateral ST depression suggestive of ischemia, no prior to compare  Impression: 43M no known hx of CAD or prior MI presents after pre-syncopal episode likely related to underlying infection/sepsis.  He was also found to have concerning small bowel mass unclear if primary or metastatic disease.  Cardiology was consulted due to positive tropoin and abnormal ecg.  Suspect that this is like demand ischemia more so than true ACS.  Recommendations: 1.  Medical management with ASA 325mg .  Would hold on dual antiplatelet and/or full dose anticoagulation at this time. 2.  Continue home statin 3.  Check lipids, hgba1c, tsh 4.  Trend cardiac enzymes  5.  Monitor on telemetry, repeat ecg in am or prn if develops chest pain 6.  TTE in am assess LV function, wall motion abnormality. 7.  Agrressive resuscitation and supportive care per primary team. 8.  Work up of unknown new small bowel mass.  Depending on etiology whether primary and/or metastatic, would need to have risk/benefit discussion with patient/family and clinical teams regarding further interventions.  Wife and son at bedside and aware of new findings.  Thank you for this consult.  Please call should you have any questions.

## 2013-02-28 NOTE — Progress Notes (Signed)
ANTIBIOTIC CONSULT NOTE - INITIAL  Pharmacy Consult for Vanco/Zosyn Indication: sepsis  No Known Allergies  Patient Measurements: Height: 5\' 7"  (170.2 cm) Weight: 177 lb 11.2 oz (80.604 kg) IBW/kg (Calculated) : 66.1 Adjusted Body Weight:   Vital Signs: Temp: 98.3 F (36.8 C) (03/13 1335) Temp src: Oral (03/13 1335) BP: 96/57 mmHg (03/13 1335) Pulse Rate: 80 (03/13 1335) Intake/Output from previous day: 03/12 0701 - 03/13 0700 In: 1550 [P.O.:150; I.V.:1300; IV Piggyback:100] Out: 200 [Urine:200] Intake/Output from this shift: Total I/O In: 123 [P.O.:120; I.V.:3] Out: 200 [Urine:200]  Labs:  Recent Labs  02/27/13 2022 02/28/13 0212  WBC 14.0* 8.9  HGB 8.9* 8.3*  PLT 237 201  CREATININE 1.51* 1.49*   Estimated Creatinine Clearance: 39.5 ml/min (by C-G formula based on Cr of 1.49). No results found for this basename: VANCOTROUGH, VANCOPEAK, VANCORANDOM, GENTTROUGH, GENTPEAK, GENTRANDOM, TOBRATROUGH, TOBRAPEAK, TOBRARND, AMIKACINPEAK, AMIKACINTROU, AMIKACIN,  in the last 72 hours   Microbiology: No results found for this or any previous visit (from the past 720 hour(s)).  Medical History: Past Medical History  Diagnosis Date  . Hypertension   . Hyperlipidemia     Assessment: Admit Complaint: fevers, presyncope, chills, hypotension. Abnormal EKG and + troponins. Abdominal mass on CT,   Anticoagulation: Lovenox 40mg /d. Scr 1.49 with CrCl 40. Hgb 8.3, plts 201  Infectious Disease Vancomycin and now Zosyn for FUO/sepsis. BC x 2 sent. Tmax 102.8 with Tc 99.1. WBC down from 14 to 8.9 today. 3/12 CXR=Left retrocardiac opacity may reflect atelectasis and / or infiltrate.   3/13 Unasyn>>3/13 3/13 Vanco>> 3/13 Zosyn>>  Cardiovascular HTN, HLD. Now NSTEMI, 96-57, HR 80. Cards consult says medical management for now. Plan TTE. Echo pending.  Meds: ASA, Zocor  Endocrinology: Check TSH. Check A1C.  Gastrointestinal / Nutrition: Abdominal mass on CT    Neurology  Nephrology: Scr 1.49 with CrCl 40  Pulmonary  Hematology / Oncology: Baseline anemia. Hgb 8.3   PTA Medication Issues  Best Practices: Lovenox   Goal of Therapy:  Vancomycin trough level 15-20 mcg/ml  Plan:  Vanco 1g IV q24h Zosyn 3.375g IV q8h Work-up bowel mass F/u Echo, TSH, Hgb A1c  Crystal S. Merilynn Finland, PharmD, BCPS Clinical Staff Pharmacist Pager [phone]  Misty Stanley Stillinger 02/28/2013,3:23 PM

## 2013-02-28 NOTE — ED Notes (Signed)
Cardiology at bedside.

## 2013-02-28 NOTE — ED Notes (Signed)
Pt too uncomfortable to do full set of orthostatic vitals

## 2013-02-28 NOTE — Progress Notes (Signed)
Spoke to Dr. Tenny Craw regarding Aspirin dosage and to report increased troponin level. New orders given for ASA. Will monitor troponin; likely due to remaining diagnosis. Mamie Levers

## 2013-02-28 NOTE — Progress Notes (Addendum)
[facility] PROGRESS NOTE  Yassir Enis ZOX:[id number] DOB: Jun 04, 1929 DOA: 02/27/2013 PCP: Lenora Boys, MD  Brief narrative: 79 y.o. male who presented to [facility] ED 02/27/13 with complaints of feeling weak and dizzy. Patient reported no loss of consciousness, no chest pain, no palpitations. He did report having fevers and chills started 1 day prior to this admission. In ED, patient was hypotensive with BP 79/29. In addition, evaluation included CXR which showed left retrocardiac opacity. 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 Urinalysis was positive for trace leukocyte esterases. In addition, his BMET revealed creatinine of 1.49 (there was no previous value for comparison). Cardiac enzymes were elevated initially at 0.98. His CBC revealed hemoglobin of 8.3.  Assessment/Plan:  Principal Problem:   *Near syncope  Unclear etiology, possible ACS, sepsis in the setting of elevated cardiac enzymes and procalcitonin respectively.  BP continues to be low at 96/57 but patient still does not require pressor support.  Cardiac enzymes are trending up 0.98 -->2.3 --> 5.27 --> 6.16. Appreciate cardiology following. 2 D ECHO on this admission with EF 55%  Continue current IV fluids, NS @ 125 cc/hr Active Problems:   NSTEMI (non-ST elevated myocardial infarction)  Continue aspirin and simvastatin  Appreciate  cardiology following   Abdominal mass, RLQ (right lower quadrant)  Order placed for biopsy of this mass   Anemia  Unclear etiology  Hemoglobin 8.9 on admission and repeat value stable at 8.3  Appreciate GI consultation   Acute kidney failure  Dehydration from an acute infection, UTI, pneumonia  Continue IV fluids, NS @ 125 cc/hr  Follow up BMP in am   UTI (urinary tract infection)  Urinalysis with small amount of leukocyte  esterases. I will adjust antibiotic regimen to vanco and zosyn and will d/c unasyn   CAP (community acquired pneumonia)  Continue vanco and zosyn for now  Follow up blood culture results   Code Status: full code Family Communication: no family at bedside Disposition Plan: home when stable  Manson Passey, MD  [facility] Pager [phone]  If 7PM-7AM, please contact night-coverage [email or web address] Password [email or web address] 02/28/2013, 3:20 PM   LOS: 1 day   Consultants:  Cardiology (Dr. Tenny Craw, Gunnar Fusi)  Gastroenterology   Procedures:  None - order placed for biopsy of abdominal mass  Antibiotics:  Vanco 02/27/13 -->  Zosyn 02/28/2013 -->  HPI/Subjective: No acute overnight events.  Objective: Filed Vitals:   02/28/13 0110 02/28/13 0133 02/28/13 0206 02/28/13 1335  BP:  103/50 106/46 96/57  Pulse:  99 98 80  Temp: 99.6 F (37.6 C) 99.9 F (37.7 C) 99.1 F (37.3 C) 98.3 F (36.8 C)  TempSrc: Oral Oral Oral Oral  Resp:  23 20 18   Height:   5\' 7"  (1.702 m)   Weight:   80.604 kg (177 lb 11.2 oz)   SpO2:  95% 94% 98%    Intake/Output Summary (Last 24 hours) at 02/28/13 1520 Last data filed at 02/28/13 1335  Gross per 24 hour  Intake   1673 ml  Output    400 ml  Net   1273 ml    Exam:   General:  Pt is alert,  not in acute distress  Cardiovascular: Regular rate and rhythm, S1/S2 apprecaited  Respiratory: Clear to auscultation bilaterally, no wheezing, no crackles, no rhonchi  Abdomen: Soft, tender across mid abdomen on deep palpation, non distended, bowel sounds  present, no guarding  Extremities: No edema, pulses DP and PT palpable bilaterally  Neuro: Grossly nonfocal  Data Reviewed: Basic Metabolic Panel:  Recent Labs Lab 02/27/13 2022 02/28/13 0212  NA 141 138  K 3.5 3.5  CL 104 102  CO2 24 23  GLUCOSE 135* 124*  BUN 25* 24*  CREATININE 1.51* 1.49*  CALCIUM 8.6 8.0*   Liver Function Tests:  Recent Labs Lab 02/27/13 2022 02/28/13 0212  AST 23 28  ALT  20 19  ALKPHOS 70 69  BILITOT 0.5 0.7  PROT 6.7 5.9*  ALBUMIN 3.3* 2.8*    Recent Labs Lab 02/27/13 2022  LIPASE 26   No results found for this basename: AMMONIA,  in the last 168 hours CBC:  Recent Labs Lab 02/27/13 2022 02/28/13 0212  WBC 14.0* 8.9  NEUTROABS 12.3*  --   HGB 8.9* 8.3*  HCT 26.5* 24.3*  MCV 90.4 91.0  PLT 237 201   Cardiac Enzymes:  Recent Labs Lab 02/27/13 2022 02/28/13 0212 02/28/13 1010 02/28/13 1245  TROPONINI 0.98* 2.30* 5.27* 6.16*    Studies: Ct Abdomen Pelvis W Contrast 02/27/2013   IMPRESSION: 6.2 x 5.7 cm heterogeneous attenuation mass within the right lower abdomen, intimately associated with a loop of proximal ileum. Internal areas of low attenuation favored to reflect necrosis. Favored differential includes a small bowel stromal tumor, metastatic disease, or lymphoma. Recommend tissue sampling.  Adjacent mesenteric fat stranding and peritoneal thickening may reflect local spread or superimposed infection.   Original Report Authenticated By: Jearld Lesch, M.D.    Dg Chest Port 1 View 02/27/2013  *  IMPRESSION: Left retrocardiac opacity may reflect atelectasis and / or infiltrate.   Original Report Authenticated By: Malachy Moan, M.D.     Scheduled Meds: . ampicillin-sulbactam   3 g Intravenous Q8H  . aspirin  325 mg Oral Daily  . enoxaparin (LOVENOX)   40 mg Subcutaneous Q24H  . simvastatin  20 mg Oral QPM  .  vancomycin  1,000 mg Intravenous Q24H

## 2013-02-28 NOTE — Progress Notes (Signed)
Subjective: Patient denies CP  Breathing is comfortable at rest. Objective: Filed Vitals:   02/28/13 0100 02/28/13 0110 02/28/13 0133 02/28/13 0206  BP: 79/29  103/50 106/46  Pulse: 99  99 98  Temp:  99.6 F (37.6 C) 99.9 F (37.7 C) 99.1 F (37.3 C)  TempSrc:  Oral Oral Oral  Resp: 26  23 20   Height:    5\' 7"  (1.702 m)  Weight:    177 lb 11.2 oz (80.604 kg)  SpO2: 94%  95% 94%   Weight change:   Intake/Output Summary (Last 24 hours) at 02/28/13 0813 Last data filed at 02/28/13 0526  Gross per 24 hour  Intake   1550 ml  Output    200 ml  Net   1350 ml    General: Alert, awake, oriented x3, in no acute distress Neck:  JVP is normal Heart: Regular rate and rhythm, without murmurs, rubs, gallops.  Lungs: Clear to auscultation.  No rales or wheezes. Exemities:  No edema.   Neuro: Grossly intact, nonfocal. Tele: SR  Lab Results: Results for orders placed during the hospital encounter of 02/27/13 (from the past 24 hour(s))  COMPREHENSIVE METABOLIC PANEL     Status: Abnormal   Collection Time    02/27/13  8:22 PM      Result Value Range   Sodium 141  135 - 145 mEq/L   Potassium 3.5  3.5 - 5.1 mEq/L   Chloride 104  96 - 112 mEq/L   CO2 24  19 - 32 mEq/L   Glucose, Bld 135 (*) 70 - 99 mg/dL   BUN 25 (*) 6 - 23 mg/dL   Creatinine, Ser 2.13 (*) 0.50 - 1.35 mg/dL   Calcium 8.6  8.4 - 08.6 mg/dL   Total Protein 6.7  6.0 - 8.3 g/dL   Albumin 3.3 (*) 3.5 - 5.2 g/dL   AST 23  0 - 37 U/L   ALT 20  0 - 53 U/L   Alkaline Phosphatase 70  39 - 117 U/L   Total Bilirubin 0.5  0.3 - 1.2 mg/dL   GFR calc non Af Amer 42 (*) >90 mL/min   GFR calc Af Amer 48 (*) >90 mL/min  CBC WITH DIFFERENTIAL     Status: Abnormal   Collection Time    02/27/13  8:22 PM      Result Value Range   WBC 14.0 (*) 4.0 - 10.5 K/uL   RBC 2.93 (*) 4.22 - 5.81 MIL/uL   Hemoglobin 8.9 (*) 13.0 - 17.0 g/dL   HCT 57.8 (*) 46.9 - 62.9 %   MCV 90.4  78.0 - 100.0 fL   MCH 30.4  26.0 - 34.0 pg   MCHC 33.6   30.0 - 36.0 g/dL   RDW 52.8  41.3 - 24.4 %   Platelets 237  150 - 400 K/uL   Neutrophils Relative 88 (*) 43 - 77 %   Neutro Abs 12.3 (*) 1.7 - 7.7 K/uL   Lymphocytes Relative 3 (*) 12 - 46 %   Lymphs Abs 0.4 (*) 0.7 - 4.0 K/uL   Monocytes Relative 9  3 - 12 %   Monocytes Absolute 1.2 (*) 0.1 - 1.0 K/uL   Eosinophils Relative 0  0 - 5 %   Eosinophils Absolute 0.0  0.0 - 0.7 K/uL   Basophils Relative 0  0 - 1 %   Basophils Absolute 0.0  0.0 - 0.1 K/uL  LIPASE, BLOOD     Status: None  Collection Time    02/27/13  8:22 PM      Result Value Range   Lipase 26  11 - 59 U/L  TROPONIN I     Status: Abnormal   Collection Time    02/27/13  8:22 PM      Result Value Range   Troponin I 0.98 (*) <0.30 ng/mL  PROCALCITONIN     Status: None   Collection Time    02/27/13  8:22 PM      Result Value Range   Procalcitonin 1.26    URINALYSIS, ROUTINE W REFLEX MICROSCOPIC     Status: Abnormal   Collection Time    02/27/13  8:23 PM      Result Value Range   Color, Urine YELLOW  YELLOW   APPearance CLEAR  CLEAR   Specific Gravity, Urine 1.027  1.005 - 1.030   pH 5.0  5.0 - 8.0   Glucose, UA 100 (*) NEGATIVE mg/dL   Hgb urine dipstick NEGATIVE  NEGATIVE   Bilirubin Urine SMALL (*) NEGATIVE   Ketones, ur 15 (*) NEGATIVE mg/dL   Protein, ur NEGATIVE  NEGATIVE mg/dL   Urobilinogen, UA 1.0  0.0 - 1.0 mg/dL   Nitrite NEGATIVE  NEGATIVE   Leukocytes, UA TRACE (*) NEGATIVE  URINE MICROSCOPIC-ADD ON     Status: None   Collection Time    02/27/13  8:23 PM      Result Value Range   Squamous Epithelial / LPF RARE  RARE   WBC, UA 0-2  <3 WBC/hpf   RBC / HPF 0-2  <3 RBC/hpf   Bacteria, UA RARE  RARE   Urine-Other MUCOUS PRESENT    CG4 I-STAT (LACTIC ACID)     Status: None   Collection Time    02/27/13  9:11 PM      Result Value Range   Lactic Acid, Venous 0.89  0.5 - 2.2 mmol/L  OCCULT BLOOD, POC DEVICE     Status: None   Collection Time    02/27/13 10:24 PM      Result Value Range   Fecal  Occult Bld NEGATIVE  NEGATIVE  TYPE AND SCREEN     Status: None   Collection Time    02/27/13 10:45 PM      Result Value Range   ABO/RH(D) O NEG     Antibody Screen NEG     Sample Expiration 03/02/2013    ABO/RH     Status: None   Collection Time    02/27/13 10:45 PM      Result Value Range   ABO/RH(D) O NEG    TROPONIN I     Status: Abnormal   Collection Time    02/28/13  2:12 AM      Result Value Range   Troponin I 2.30 (*) <0.30 ng/mL  COMPREHENSIVE METABOLIC PANEL     Status: Abnormal   Collection Time    02/28/13  2:12 AM      Result Value Range   Sodium 138  135 - 145 mEq/L   Potassium 3.5  3.5 - 5.1 mEq/L   Chloride 102  96 - 112 mEq/L   CO2 23  19 - 32 mEq/L   Glucose, Bld 124 (*) 70 - 99 mg/dL   BUN 24 (*) 6 - 23 mg/dL   Creatinine, Ser 7.82 (*) 0.50 - 1.35 mg/dL   Calcium 8.0 (*) 8.4 - 10.5 mg/dL   Total Protein 5.9 (*) 6.0 - 8.3 g/dL  Albumin 2.8 (*) 3.5 - 5.2 g/dL   AST 28  0 - 37 U/L   ALT 19  0 - 53 U/L   Alkaline Phosphatase 69  39 - 117 U/L   Total Bilirubin 0.7  0.3 - 1.2 mg/dL   GFR calc non Af Amer 42 (*) >90 mL/min   GFR calc Af Amer 49 (*) >90 mL/min  CBC     Status: Abnormal   Collection Time    02/28/13  2:12 AM      Result Value Range   WBC 8.9  4.0 - 10.5 K/uL   RBC 2.67 (*) 4.22 - 5.81 MIL/uL   Hemoglobin 8.3 (*) 13.0 - 17.0 g/dL   HCT 65.7 (*) 84.6 - 96.2 %   MCV 91.0  78.0 - 100.0 fL   MCH 31.1  26.0 - 34.0 pg   MCHC 34.2  30.0 - 36.0 g/dL   RDW 95.2  84.1 - 32.4 %   Platelets 201  150 - 400 K/uL  RETICULOCYTES     Status: Abnormal   Collection Time    02/28/13  2:12 AM      Result Value Range   Retic Ct Pct 2.3  0.4 - 3.1 %   RBC. 2.68 (*) 4.22 - 5.81 MIL/uL   Retic Count, Manual 61.6  19.0 - 186.0 K/uL    Studies/Results: @RISRSLT24 @  Medications: Reviewed.  Echo:  Pending  Impression:  1.  Abnormal troponin  Continue to track  Probably represents demand ischemia in setting of other medical issues.  Echo pending Would  repeat CXR  W/U will depend in echo results as well as plans for work up of other medical problems.  (GI)  Dietrich Pates   LOS: 1 day   Dietrich Pates 02/28/2013, 8:13 AM

## 2013-02-28 NOTE — Progress Notes (Signed)
Aware of request for biopsy of RLQ mass concerning for SB stromal tumor. GI is to see but initial recommendation is tissue sampling.  Review of chart including Abnormal Cardiac enzymes, presently thought to be due to demand ischemia and not true AMI. Echo completed.  Reviewed images with Dr. Deanne Coffer, who feels mass is approachable for percutaneous biopsy. Risks of bleeding and bowel injury present.  Can move forward with procedure as early as tomorrow if primary and Cardiology services feel pt is stable. Will make NPO p MN and check am coags. If pt remains stable and cleared for procedure, can set up for tomorrow. Alternative would be allowing any acute issues to resolve and schedule elective biopsy on outpt basis.  Will follow up in am.

## 2013-02-28 NOTE — Progress Notes (Signed)
ANTIBIOTIC CONSULT NOTE - INITIAL  Pharmacy Consult for Vancomycin and Unasyn Indication: fevers/chills  No Known Allergies  Patient Measurements: Height: 5\' 7"  (170.2 cm) Weight: 180 lb (81.647 kg) IBW/kg (Calculated) : 66.1  Vital Signs: Temp: 98.9 F (37.2 C) (03/12 2335) Temp src: Oral (03/12 2335) BP: 124/107 mmHg (03/13 0005) Pulse Rate: 119 (03/13 0005)  Labs:  Recent Labs  02/27/13 2022  WBC 14.0*  HGB 8.9*  PLT 237  CREATININE 1.51*   Estimated Creatinine Clearance: 39.2 ml/min (by C-G formula based on Cr of 1.51). No results found for this basename: VANCOTROUGH, VANCOPEAK, VANCORANDOM, GENTTROUGH, GENTPEAK, GENTRANDOM, TOBRATROUGH, TOBRAPEAK, TOBRARND, AMIKACINPEAK, AMIKACINTROU, AMIKACIN,  in the last 72 hours   Microbiology: No results found for this or any previous visit (from the past 720 hour(s)).  Medical History: Past Medical History  Diagnosis Date  . Hypertension   . Hyperlipidemia     Medications:  Lotrel  ASA  Coreg  HCTZ  Zocor  Assessment: 79 yo male with FUO for empiric anitbiotics  Goal of Therapy:  Vancomycin trough 15-20  Plan:  Vancomycin 1500 mg IV now, then 750 mg IV q24h Unasyn 3 g IV q8h    Abbott, Gary Fleet 02/28/2013,12:12 AM

## 2013-02-28 NOTE — Progress Notes (Signed)
  Echocardiogram 2D Echocardiogram has been performed.  Cathie Beams 02/28/2013, 8:55 AM

## 2013-03-01 ENCOUNTER — Inpatient Hospital Stay (HOSPITAL_COMMUNITY): Payer: Medicare Other

## 2013-03-01 ENCOUNTER — Encounter (HOSPITAL_COMMUNITY): Payer: Self-pay | Admitting: Internal Medicine

## 2013-03-01 DIAGNOSIS — R55 Syncope and collapse: Secondary | ICD-10-CM

## 2013-03-01 DIAGNOSIS — I214 Non-ST elevation (NSTEMI) myocardial infarction: Secondary | ICD-10-CM

## 2013-03-01 DIAGNOSIS — N390 Urinary tract infection, site not specified: Secondary | ICD-10-CM

## 2013-03-01 DIAGNOSIS — I959 Hypotension, unspecified: Secondary | ICD-10-CM | POA: Diagnosis present

## 2013-03-01 DIAGNOSIS — R509 Fever, unspecified: Secondary | ICD-10-CM

## 2013-03-01 LAB — CBC
HCT: 25.1 % — ABNORMAL LOW (ref 39.0–52.0)
MCV: 91.3 fL (ref 78.0–100.0)
RDW: 13.1 % (ref 11.5–15.5)
WBC: 12.1 10*3/uL — ABNORMAL HIGH (ref 4.0–10.5)

## 2013-03-01 LAB — BASIC METABOLIC PANEL
BUN: 23 mg/dL (ref 6–23)
CO2: 24 mEq/L (ref 19–32)
Chloride: 104 mEq/L (ref 96–112)
Creatinine, Ser: 1.42 mg/dL — ABNORMAL HIGH (ref 0.50–1.35)
GFR calc Af Amer: 52 mL/min — ABNORMAL LOW (ref >90)
Glucose, Bld: 133 mg/dL — ABNORMAL HIGH (ref 70–99)

## 2013-03-01 MED ORDER — ZOLPIDEM TARTRATE 5 MG PO TABS
5.0000 mg | ORAL_TABLET | Freq: Every day | ORAL | Status: DC
  Administered 2013-03-01 – 2013-03-05 (×5): 5 mg via ORAL
  Filled 2013-03-01 (×5): qty 1

## 2013-03-01 MED ORDER — FENTANYL CITRATE 0.05 MG/ML IJ SOLN
INTRAMUSCULAR | Status: AC
  Filled 2013-03-01: qty 2

## 2013-03-01 MED ORDER — FENTANYL CITRATE 0.05 MG/ML IJ SOLN
INTRAMUSCULAR | Status: AC | PRN
  Administered 2013-03-01: 25 ug via INTRAVENOUS
  Administered 2013-03-01: 50 ug via INTRAVENOUS

## 2013-03-01 MED ORDER — MIDAZOLAM HCL 2 MG/2ML IJ SOLN
INTRAMUSCULAR | Status: AC | PRN
  Administered 2013-03-01 (×2): 1 mg via INTRAVENOUS

## 2013-03-01 MED ORDER — MIDAZOLAM HCL 2 MG/2ML IJ SOLN
INTRAMUSCULAR | Status: AC
  Filled 2013-03-01: qty 4

## 2013-03-01 NOTE — Progress Notes (Signed)
Patient ID: [name] [name], male   DOB: December 17, 1929, 79 y.o.   MRN: [id number]   I have reviewed record completely. More complete note to follow later today. The patient is cleared for percutaneous biopsy in radiology today. I feel it is most prudent to proceed with this.  Jerral Bonito, MD

## 2013-03-01 NOTE — Progress Notes (Signed)
O2 sats RA 89%; O2 2L [place] applied at this time; will cont. To monitor.

## 2013-03-01 NOTE — Progress Notes (Signed)
Pt HR got up into the 130's and had 16 beats of SVT. Pt asymptomatic, VS stable, MD notified, no new orders given. Will continue to monitor.

## 2013-03-01 NOTE — Progress Notes (Signed)
Pt back in room s/p CT guided biopsy; pt denies pain at this time; VSS; R abdomen bandai D&I; wife at bedside; will cont. To monitor.

## 2013-03-01 NOTE — Progress Notes (Signed)
[facility] PROGRESS NOTE  [name] [name] WUJ:[id number] DOB: 11-Aug-1929 DOA: 02/27/2013 PCP: Lenora Boys, MD  Brief narrative: 79 y.o. male who presented to [facility] ED 02/27/13 with complaints of feeling weak and dizzy. Patient reported no loss of consciousness, no chest pain, no palpitations. He did report having fevers and chills started 1 day prior to this admission.  In ED, patient was hypotensive with BP 79/29. Further evaluation included CXR which showed left retrocardiac opacity. 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 Urinalysis was positive for trace leukocyte esterases. In addition, his BMET revealed creatinine of 1.49 (there was no previous value for comparison). Cardiac enzymes were elevated initially at 0.98. His CBC revealed hemoglobin of 8.3.   Assessment/Plan:   Principal Problem:  *Near syncope  Unclear etiology, possible sepsis in the setting of elevated procalcitonin. BP stable at  110/53.  Cardiac enzymes  0.98 -->2.3 --> 5.27 --> 6.16. Appreciate cardiology following. 2 D ECHO on this admission with EF 55%  Continue  IV fluids, NS @ 125 cc/hr Active Problems:  Abdominal mass, RLQ (right lower quadrant)   Follow up biopsy of RLQ abdominal mass; appreciate radiology consultation. NSTEMI (non-ST elevated myocardial infarction)  Continue aspirin and simvastatin  Appreciate cardiology following Anemia  Unclear etiology  Hemoglobin 8.9 on admission and repeat value stable at 8.4 Will wait for results of biopsy before GI consulted Acute kidney failure  Dehydration from an acute infection, UTI, pneumonia  Continue IV fluids, NS @ 125 cc/hr  Creatinine trending down 1.51 --> 1.49 --> 1.42 UTI (urinary tract infection)  Urinalysis with small amount of leukocyte esterases.  Continue vanco and zosyn for now CAP  (community acquired pneumonia)  Continue vanco and zosyn. Follow up blood culture results - no growth to date   Code Status: full code  Family Communication: family (son) at bedside  Disposition Plan: home when stable   Manson Passey, MD  [facility]  Pager [phone]   Consultants:  Cardiology (Dr. Tenny Craw, Gunnar Fusi)  Radiology for biopsy Procedures:  None - order placed for biopsy of abdominal mass Antibiotics:  Vanco 02/27/13 -->  Zosyn 02/28/2013 -->   If 7PM-7AM, please contact night-coverage [email or web address] Password [facility] 03/01/2013, 11:37 AM   LOS: 2 days    HPI/Subjective: No acute overnight events. No chest pain, no shortness of breath.   Objective: Filed Vitals:   02/28/13 0206 02/28/13 1335 02/28/13 2026 03/01/13 0458  BP: 106/46 96/57 109/50 110/53  Pulse: 98 80 91 93  Temp: 99.1 F (37.3 C) 98.3 F (36.8 C) 98.9 F (37.2 C) 98.3 F (36.8 C)  TempSrc: Oral Oral Oral Oral  Resp: 20 18 18 18   Height: 5\' 7"  (1.702 m)     Weight: 80.604 kg (177 lb 11.2 oz)     SpO2: 94% 98% 99% 91%    Intake/Output Summary (Last 24 hours) at 03/01/13 1137 Last data filed at 03/01/13 0753  Gross per 24 hour  Intake    363 ml  Output    450 ml  Net    -87 ml    Exam:   General:  Pt is alert, follows commands appropriately, not in acute distress  Cardiovascular: Regular rate and rhythm, S1/S2, no murmurs, no rubs, no gallops  Respiratory: Clear to auscultation bilaterally, no wheezing, no crackles, no rhonchi  Abdomen: Soft, non tender, distended, bowel sounds present, no guarding  Extremities: No edema, pulses DP and PT palpable bilaterally  Neuro: Grossly nonfocal  Data Reviewed: Basic Metabolic Panel:  Recent Labs Lab 02/27/13 2022 02/28/13 0212 03/01/13 0512  NA 141 138 140  K 3.5 3.5 3.6  CL 104 102 104  CO2 24 23 24   GLUCOSE 135* 124* 133*  BUN 25* 24* 23  CREATININE 1.51* 1.49* 1.42*  CALCIUM 8.6 8.0* 8.1*   Liver Function Tests:  Recent Labs Lab  02/27/13 2022 02/28/13 0212  AST 23 28  ALT 20 19  ALKPHOS 70 69  BILITOT 0.5 0.7  PROT 6.7 5.9*  ALBUMIN 3.3* 2.8*    Recent Labs Lab 02/27/13 2022  LIPASE 26   No results found for this basename: AMMONIA,  in the last 168 hours CBC:  Recent Labs Lab 02/27/13 2022 02/28/13 0212 03/01/13 0512  WBC 14.0* 8.9 12.1*  NEUTROABS 12.3*  --   --   HGB 8.9* 8.3* 8.4*  HCT 26.5* 24.3* 25.1*  MCV 90.4 91.0 91.3  PLT 237 201 195   Cardiac Enzymes:  Recent Labs Lab 02/27/13 2022 02/28/13 0212 02/28/13 1010 02/28/13 1245  TROPONINI 0.98* 2.30* 5.27* 6.16*    CULTURE, BLOOD (ROUTINE X 2)     Status: None   Collection Time    02/27/13  9:30 PM      Result Value Range Status   Culture     Final   Value:        BLOOD CULTURE RECEIVED NO GROWTH TO DATE    Report Status PENDING   Incomplete  CULTURE, BLOOD (ROUTINE X 2)     Status: None   Collection Time    02/27/13  9:35 PM      Result Value Range Status   Culture     Final   Value:        BLOOD CULTURE RECEIVED NO GROWTH TO DATE    Report Status PENDING   Incomplete     Studies: Ct Abdomen Pelvis W Contrast 02/27/2013  * IMPRESSION: 6.2 x 5.7 cm heterogeneous attenuation mass within the right lower abdomen, intimately associated with a loop of proximal ileum. Internal areas of low attenuation favored to reflect necrosis. Favored differential includes a small bowel stromal tumor, metastatic disease, or lymphoma. Recommend tissue sampling.  Adjacent mesenteric fat stranding and peritoneal thickening may reflect local spread or superimposed infection.   Original Report Authenticated By: Jearld Lesch, M.D.    Dg Chest Port 1 View 02/27/2013    IMPRESSION: Left retrocardiac opacity may reflect atelectasis and / or infiltrate.   Original Report Authenticated By: Malachy Moan, M.D.     Scheduled Meds: . aspirin  325 mg Oral Daily  . enoxaparin (LOVENOX)   40 mg Subcutaneous Q24H  . piperacillin-tazobactam   3.375 g  Intravenous Q8H  . simvastatin  20 mg Oral QPM  . vancomycin  1,000 mg Intravenous Q24H  . zolpidem  5 mg Oral QHS

## 2013-03-01 NOTE — Procedures (Signed)
CT guided biopsy/aspiration of the lower abdominal lesion.  60 ml of bloody fluid was aspirated, minimal soft tissue obtained from core biopsy.  Fluid sent for gram stain, culture and cytology.  Findings are suggestive for a hematoma.  No immediate complication.

## 2013-03-01 NOTE — ED Notes (Signed)
In nurses station awaiting transport. Report was given to [place] on 2000.

## 2013-03-01 NOTE — Progress Notes (Signed)
Patient ID: [name] [name], male   DOB: 1929-03-31, 79 y.o.   MRN: [id number]   SUBJECTIVE: The patient is stable today. He's not having any chest pain or shortness of breath. I have cleared him to have his percutaneous biopsy. I spoke with the patient and his family. He is very active at home. He does significant physical activity and does not have any significant symptoms.   Filed Vitals:   02/28/13 0206 02/28/13 1335 02/28/13 2026 03/01/13 0458  BP: 106/46 96/57 109/50 110/53  Pulse: 98 80 91 93  Temp: 99.1 F (37.3 C) 98.3 F (36.8 C) 98.9 F (37.2 C) 98.3 F (36.8 C)  TempSrc: Oral Oral Oral Oral  Resp: 20 18 18 18   Height: 5\' 7"  (1.702 m)     Weight: 177 lb 11.2 oz (80.604 kg)     SpO2: 94% 98% 99% 91%    Intake/Output Summary (Last 24 hours) at 03/01/13 1119 Last data filed at 03/01/13 0753  Gross per 24 hour  Intake    363 ml  Output    450 ml  Net    -87 ml    LABS: Basic Metabolic Panel:  Recent Labs  14/78/29 0212 03/01/13 0512  NA 138 140  K 3.5 3.6  CL 102 104  CO2 23 24  GLUCOSE 124* 133*  BUN 24* 23  CREATININE 1.49* 1.42*  CALCIUM 8.0* 8.1*   Liver Function Tests:  Recent Labs  02/27/13 2022 02/28/13 0212  AST 23 28  ALT 20 19  ALKPHOS 70 69  BILITOT 0.5 0.7  PROT 6.7 5.9*  ALBUMIN 3.3* 2.8*    Recent Labs  02/27/13 2022  LIPASE 26   CBC:  Recent Labs  02/27/13 2022 02/28/13 0212 03/01/13 0512  WBC 14.0* 8.9 12.1*  NEUTROABS 12.3*  --   --   HGB 8.9* 8.3* 8.4*  HCT 26.5* 24.3* 25.1*  MCV 90.4 91.0 91.3  PLT 237 201 195   Cardiac Enzymes:  Recent Labs  02/28/13 0212 02/28/13 1010 02/28/13 1245  TROPONINI 2.30* 5.27* 6.16*   BNP: No components found with this basename: POCBNP,  D-Dimer: No results found for this basename: DDIMER,  in the last 72 hours Hemoglobin A1C: No results found for this basename: HGBA1C,  in the last 72 hours Fasting Lipid Panel: No results found for this basename: CHOL, HDL, LDLCALC, TRIG,  CHOLHDL, LDLDIRECT,  in the last 72 hours Thyroid Function Tests: No results found for this basename: TSH, T4TOTAL, FREET3, T3FREE, THYROIDAB,  in the last 72 hours  RADIOLOGY: Ct Abdomen Pelvis W Contrast  02/27/2013  *RADIOLOGY REPORT*  Clinical Data: Fever, right lower quadrant pain.  CT ABDOMEN AND PELVIS WITH CONTRAST  Technique:  Multidetector CT imaging of the abdomen and pelvis was performed following the standard protocol during bolus administration of intravenous contrast.  Contrast: OMNIPAQUE IOHEXOL 300 MG/ML  SOLN  Comparison: None.  Findings: Minimal scarring or atelectasis within the lower lungs. Heart size upper normal to mildly enlarged.  Coronary artery calcification.  Unremarkable liver, biliary system, spleen, pancreas, adrenal glands.  Symmetric renal enhancement.  No hydronephrosis or hydroureter.  Colonic diverticulosis.  No CT evidence for colitis or diverticulitis.  Normal appendix.  Small hiatal hernia.  6.2 x 5.7 cm heterogeneous attenuation mass within the right anterior lower abdomen, intimately associated with a proximal ileal loop of small bowel.  There is mild adjacent stranding of the mesenteric fat and thickening of the anterior peritoneum as seen on images 67 -  69 as index.  No free intraperitoneal air or fluid.  No lymphadenopathy.  There is scattered atherosclerotic calcification of the aorta and its branches. No aneurysmal dilatation.  Partially decompressed bladder.  Fat containing left inguinal hernia.  Multilevel degenerative changes of the imaged spine. No acute or aggressive appearing osseous lesion.  IMPRESSION: 6.2 x 5.7 cm heterogeneous attenuation mass within the right lower abdomen, intimately associated with a loop of proximal ileum. Internal areas of low attenuation favored to reflect necrosis. Favored differential includes a small bowel stromal tumor, metastatic disease, or lymphoma. Recommend tissue sampling.  Adjacent mesenteric fat stranding and  peritoneal thickening may reflect local spread or superimposed infection.   Original Report Authenticated By: Jearld Lesch, M.D.    Dg Chest Port 1 View  02/27/2013  *RADIOLOGY REPORT*  Clinical Data: Fever, fall  PORTABLE CHEST - 1 VIEW  Comparison: Prior chest x-ray 11/09/2011  Findings: Left retrocardiac opacity.  Given portable frontal technique, cardiac and mediastinal contours remain within normal limits.  Aortic atherosclerosis noted.  No effusion, or pneumothorax.  No edema.  No acute osseous abnormality.  IMPRESSION: Left retrocardiac opacity may reflect atelectasis and / or infiltrate.   Original Report Authenticated By: Malachy Moan, M.D.     PHYSICAL EXAM  Patient is oriented to person time and place. Affect is normal. 2 family members are in the room. There is no jugulovenous distention. Lungs are clear. Respiratory effort is nonlabored. Cardiac exam reveals S1 and S2. There no clicks or significant murmurs. The abdomen is soft but protuberant. There is no peripheral edema.   TELEMETRY: I have reviewed telemetry today March 01, 2013. There is normal sinus rhythm.   ASSESSMENT AND PLAN:    Near syncope     This event occurred on the morning that he was later admitted to the hospital later in the day. He was not having chest pain or shortness of breath. He did not have complete syncope. I suspect that this event was related to hypotension that was developing with his acute illness.    NSTEMI (non-ST elevated myocardial infarction)      The patient has had significant troponin elevation on admission which peaked and then came back down. His initial EKG showed bothersome diffuse ST depression. The second EKG showed some ST depression but it was less marked. As I outlined above, the patient is very active. He has not had significant symptoms when being active. However considering all of the issues, I feel that aggressive cardiac workup should be done during this admission. It is  important that we obtain information about the abnormality in his abdomen. In his case however, I would favor cardiac catheterization before any aggressive therapy is done for his abdomen if it is needed. I feel that it would be most prudent to completely no his coronary anatomy before any further steps.    Abdominal mass, RLQ (right lower quadrant)      Workup is ongoing    Anemia   Acute kidney failure   UTI (urinary tract infection)   CAP (community acquired pneumonia)   Hypotension   Willa Rough 03/01/2013 11:19 AM

## 2013-03-01 NOTE — Progress Notes (Signed)
New orders entered for regular diet.

## 2013-03-01 NOTE — Progress Notes (Signed)
Report received from CT department; pt on his way back to room s/p biopsy; no diet order at this time; MD paged regarding pt's diet; will await callback.

## 2013-03-01 NOTE — H&P (Signed)
[name] [name] is an 79 y.o. male.   Chief Complaint: admitted with weakness and dizziness NSTEMI Work up revealed abnormal CT: RLQ mass Scheduled now for biopsy in IR Dr Myrtis Ser has cleared pt for bx HPI: HTN; HLD; NSTEMI  Past Medical History  Diagnosis Date  . Hypertension   . Hyperlipidemia     History reviewed. No pertinent past surgical history.  No family history on file. Social History:  reports that he has never smoked. He has never used smokeless tobacco. His alcohol and drug histories are not on file.  Allergies: No Known Allergies  Medications Prior to Admission  Medication Sig Dispense Refill  . amLODipine-benazepril (LOTREL) 10-20 MG per capsule Take 1 capsule by mouth daily.      [name] aspirin 81 MG chewable tablet Chew 81 mg by mouth daily.      . carvedilol (COREG) 25 MG tablet Take 12.5 mg by mouth 2 (two) times daily with a meal.      . hydrochlorothiazide (HYDRODIURIL) 25 MG tablet Take 25 mg by mouth daily.      . simvastatin (ZOCOR) 20 MG tablet Take 20 mg by mouth every evening.        Results for orders placed during the hospital encounter of 02/27/13 (from the past 48 hour(s))  COMPREHENSIVE METABOLIC PANEL     Status: Abnormal   Collection Time    02/27/13  8:22 PM      Result Value Range   Sodium 141  135 - 145 mEq/L   Potassium 3.5  3.5 - 5.1 mEq/L   Chloride 104  96 - 112 mEq/L   CO2 24  19 - 32 mEq/L   Glucose, Bld 135 (*) 70 - 99 mg/dL   BUN 25 (*) 6 - 23 mg/dL   Creatinine, Ser 1.47 (*) 0.50 - 1.35 mg/dL   Calcium 8.6  8.4 - 82.9 mg/dL   Total Protein 6.7  6.0 - 8.3 g/dL   Albumin 3.3 (*) 3.5 - 5.2 g/dL   AST 23  0 - 37 U/L   ALT 20  0 - 53 U/L   Alkaline Phosphatase 70  39 - 117 U/L   Total Bilirubin 0.5  0.3 - 1.2 mg/dL   GFR calc non Af Amer 42 (*) >90 mL/min   GFR calc Af Amer 48 (*) >90 mL/min   Comment:            The eGFR has been calculated     using the CKD EPI equation.     This calculation has not been     validated in all  clinical     situations.     eGFR's persistently     <90 mL/min signify     possible Chronic Kidney Disease.  CBC WITH DIFFERENTIAL     Status: Abnormal   Collection Time    02/27/13  8:22 PM      Result Value Range   WBC 14.0 (*) 4.0 - 10.5 K/uL   RBC 2.93 (*) 4.22 - 5.81 MIL/uL   Hemoglobin 8.9 (*) 13.0 - 17.0 g/dL   HCT 56.2 (*) 13.0 - 86.5 %   MCV 90.4  78.0 - 100.0 fL   MCH 30.4  26.0 - 34.0 pg   MCHC 33.6  30.0 - 36.0 g/dL   RDW 78.4  69.6 - 29.5 %   Platelets 237  150 - 400 K/uL   Neutrophils Relative 88 (*) 43 - 77 %   Neutro Abs 12.3 (*)  1.7 - 7.7 K/uL   Lymphocytes Relative 3 (*) 12 - 46 %   Lymphs Abs 0.4 (*) 0.7 - 4.0 K/uL   Monocytes Relative 9  3 - 12 %   Monocytes Absolute 1.2 (*) 0.1 - 1.0 K/uL   Eosinophils Relative 0  0 - 5 %   Eosinophils Absolute 0.0  0.0 - 0.7 K/uL   Basophils Relative 0  0 - 1 %   Basophils Absolute 0.0  0.0 - 0.1 K/uL  LIPASE, BLOOD     Status: None   Collection Time    02/27/13  8:22 PM      Result Value Range   Lipase 26  11 - 59 U/L  TROPONIN I     Status: Abnormal   Collection Time    02/27/13  8:22 PM      Result Value Range   Troponin I 0.98 (*) <0.30 ng/mL   Comment:            Due to the release kinetics of cTnI,     a negative result within the first hours     of the onset of symptoms does not rule out     myocardial infarction with certainty.     If myocardial infarction is still suspected,     repeat the test at appropriate intervals.     CRITICAL RESULT CALLED TO, READ BACK BY AND VERIFIED WITH:     J CAMP,RN 02/27/13 WBOND  PROCALCITONIN     Status: None   Collection Time    02/27/13  8:22 PM      Result Value Range   Procalcitonin 1.26     Comment:            Interpretation:     PCT > 0.5 ng/mL and <= 2 ng/mL:     Systemic infection (sepsis) is possible,     but other conditions are known to elevate     PCT as well.     (NOTE)             ICU PCT Algorithm               Non ICU PCT Algorithm         ----------------------------     ------------------------------             PCT < 0.25 ng/mL                 PCT < 0.1 ng/mL         Stopping of antibiotics            Stopping of antibiotics           strongly encouraged.               strongly encouraged.        ----------------------------     ------------------------------           PCT level decrease by               PCT < 0.25 ng/mL           >= 80% from peak PCT           OR PCT 0.25 - 0.5 ng/mL          Stopping of antibiotics  encouraged.         Stopping of antibiotics               encouraged.        ----------------------------     ------------------------------           PCT level decrease by              PCT >= 0.25 ng/mL           < 80% from peak PCT            AND PCT >= 0.5 ng/mL            Continuing antibiotics                                                  encouraged.           Continuing antibiotics                encouraged.        ----------------------------     ------------------------------         PCT level increase compared          PCT > 0.5 ng/mL             with peak PCT AND              PCT >= 0.5 ng/mL             Escalation of antibiotics                                              strongly encouraged.          Escalation of antibiotics            strongly encouraged.  URINALYSIS, ROUTINE W REFLEX MICROSCOPIC     Status: Abnormal   Collection Time    02/27/13  8:23 PM      Result Value Range   Color, Urine YELLOW  YELLOW   APPearance CLEAR  CLEAR   Specific Gravity, Urine 1.027  1.005 - 1.030   pH 5.0  5.0 - 8.0   Glucose, UA 100 (*) NEGATIVE mg/dL   Hgb urine dipstick NEGATIVE  NEGATIVE   Bilirubin Urine SMALL (*) NEGATIVE   Ketones, ur 15 (*) NEGATIVE mg/dL   Protein, ur NEGATIVE  NEGATIVE mg/dL   Urobilinogen, UA 1.0  0.0 - 1.0 mg/dL   Nitrite NEGATIVE  NEGATIVE   Leukocytes, UA TRACE (*) NEGATIVE  URINE MICROSCOPIC-ADD ON     Status: None    Collection Time    02/27/13  8:23 PM      Result Value Range   Squamous Epithelial / LPF RARE  RARE   WBC, UA 0-2  <3 WBC/hpf   RBC / HPF 0-2  <3 RBC/hpf   Bacteria, UA RARE  RARE   Urine-Other MUCOUS PRESENT    CG4 I-STAT (LACTIC ACID)     Status: None   Collection Time    02/27/13  9:11 PM      Result Value Range   Lactic Acid, Venous 0.89  0.5 - 2.2 mmol/L  CULTURE, BLOOD (ROUTINE X 2)     Status: None  Collection Time    02/27/13  9:30 PM      Result Value Range   Specimen Description BLOOD RIGHT HAND     Special Requests BOTTLES DRAWN AEROBIC AND ANAEROBIC 10CC EACH     Culture  Setup Time 02/28/2013 02:38     Culture       Value:        BLOOD CULTURE RECEIVED NO GROWTH TO DATE CULTURE WILL BE HELD FOR 5 DAYS BEFORE ISSUING A FINAL NEGATIVE REPORT   Report Status PENDING    CULTURE, BLOOD (ROUTINE X 2)     Status: None   Collection Time    02/27/13  9:35 PM      Result Value Range   Specimen Description BLOOD RIGHT ARM     Special Requests BOTTLES DRAWN AEROBIC AND ANAEROBIC 10CC EACH     Culture  Setup Time 02/28/2013 02:41     Culture       Value:        BLOOD CULTURE RECEIVED NO GROWTH TO DATE CULTURE WILL BE HELD FOR 5 DAYS BEFORE ISSUING A FINAL NEGATIVE REPORT   Report Status PENDING    OCCULT BLOOD, POC DEVICE     Status: None   Collection Time    02/27/13 10:24 PM      Result Value Range   Fecal Occult Bld NEGATIVE  NEGATIVE  TYPE AND SCREEN     Status: None   Collection Time    02/27/13 10:45 PM      Result Value Range   ABO/RH(D) O NEG     Antibody Screen NEG     Sample Expiration 03/02/2013    ABO/RH     Status: None   Collection Time    02/27/13 10:45 PM      Result Value Range   ABO/RH(D) O NEG    TROPONIN I     Status: Abnormal   Collection Time    02/28/13  2:12 AM      Result Value Range   Troponin I 2.30 (*) <0.30 ng/mL   Comment:            Due to the release kinetics of cTnI,     a negative result within the first hours     of the  onset of symptoms does not rule out     myocardial infarction with certainty.     If myocardial infarction is still suspected,     repeat the test at appropriate intervals.     CRITICAL VALUE NOTED.  VALUE IS CONSISTENT WITH PREVIOUSLY REPORTED AND CALLED VALUE.  COMPREHENSIVE METABOLIC PANEL     Status: Abnormal   Collection Time    02/28/13  2:12 AM      Result Value Range   Sodium 138  135 - 145 mEq/L   Potassium 3.5  3.5 - 5.1 mEq/L   Chloride 102  96 - 112 mEq/L   CO2 23  19 - 32 mEq/L   Glucose, Bld 124 (*) 70 - 99 mg/dL   BUN 24 (*) 6 - 23 mg/dL   Creatinine, Ser 1.61 (*) 0.50 - 1.35 mg/dL   Calcium 8.0 (*) 8.4 - 10.5 mg/dL   Total Protein 5.9 (*) 6.0 - 8.3 g/dL   Albumin 2.8 (*) 3.5 - 5.2 g/dL   AST 28  0 - 37 U/L   ALT 19  0 - 53 U/L   Alkaline Phosphatase 69  39 - 117 U/L   Total Bilirubin  0.7  0.3 - 1.2 mg/dL   GFR calc non Af Amer 42 (*) >90 mL/min   GFR calc Af Amer 49 (*) >90 mL/min   Comment:            The eGFR has been calculated     using the CKD EPI equation.     This calculation has not been     validated in all clinical     situations.     eGFR's persistently     <90 mL/min signify     possible Chronic Kidney Disease.  CBC     Status: Abnormal   Collection Time    02/28/13  2:12 AM      Result Value Range   WBC 8.9  4.0 - 10.5 K/uL   RBC 2.67 (*) 4.22 - 5.81 MIL/uL   Hemoglobin 8.3 (*) 13.0 - 17.0 g/dL   HCT 16.1 (*) 09.6 - 04.5 %   MCV 91.0  78.0 - 100.0 fL   MCH 31.1  26.0 - 34.0 pg   MCHC 34.2  30.0 - 36.0 g/dL   RDW 40.9  81.1 - 91.4 %   Platelets 201  150 - 400 K/uL  VITAMIN B12     Status: None   Collection Time    02/28/13  2:12 AM      Result Value Range   Vitamin B-12 240  211 - 911 pg/mL  FOLATE     Status: None   Collection Time    02/28/13  2:12 AM      Result Value Range   Folate 12.8     Comment: (NOTE)     Reference Ranges            Deficient:       0.4 - 3.3 ng/mL            Indeterminate:   3.4 - 5.4 ng/mL             Normal:              > 5.4 ng/mL  IRON AND TIBC     Status: Abnormal   Collection Time    02/28/13  2:12 AM      Result Value Range   Iron <10 (*) 42 - 135 ug/dL   TIBC Not calculated due to Iron <10.  215 - 435 ug/dL   Saturation Ratios Not calculated due to Iron <10.  20 - 55 %   UIBC 340  125 - 400 ug/dL  FERRITIN     Status: None   Collection Time    02/28/13  2:12 AM      Result Value Range   Ferritin 55  22 - 322 ng/mL  RETICULOCYTES     Status: Abnormal   Collection Time    02/28/13  2:12 AM      Result Value Range   Retic Ct Pct 2.3  0.4 - 3.1 %   RBC. 2.68 (*) 4.22 - 5.81 MIL/uL   Retic Count, Manual 61.6  19.0 - 186.0 K/uL  TROPONIN I     Status: Abnormal   Collection Time    02/28/13 10:10 AM      Result Value Range   Troponin I 5.27 (*) <0.30 ng/mL   Comment:            Due to the release kinetics of cTnI,     a negative result within the first hours     of the onset  of symptoms does not rule out     myocardial infarction with certainty.     If myocardial infarction is still suspected,     repeat the test at appropriate intervals.     CRITICAL VALUE NOTED.  VALUE IS CONSISTENT WITH PREVIOUSLY REPORTED AND CALLED VALUE.  TROPONIN I     Status: Abnormal   Collection Time    02/28/13 12:45 PM      Result Value Range   Troponin I 6.16 (*) <0.30 ng/mL   Comment:            Due to the release kinetics of cTnI,     a negative result within the first hours     of the onset of symptoms does not rule out     myocardial infarction with certainty.     If myocardial infarction is still suspected,     repeat the test at appropriate intervals.     CRITICAL VALUE NOTED.  VALUE IS CONSISTENT WITH PREVIOUSLY REPORTED AND CALLED VALUE.  PROTIME-INR     Status: None   Collection Time    03/01/13  5:12 AM      Result Value Range   Prothrombin Time 14.7  11.6 - 15.2 seconds   INR 1.17  0.00 - 1.49  APTT     Status: Abnormal   Collection Time    03/01/13  5:12 AM       Result Value Range   aPTT 41 (*) 24 - 37 seconds   Comment:            IF BASELINE aPTT IS ELEVATED,     SUGGEST PATIENT RISK ASSESSMENT     BE USED TO DETERMINE APPROPRIATE     ANTICOAGULANT THERAPY.  CBC     Status: Abnormal   Collection Time    03/01/13  5:12 AM      Result Value Range   WBC 12.1 (*) 4.0 - 10.5 K/uL   RBC 2.75 (*) 4.22 - 5.81 MIL/uL   Hemoglobin 8.4 (*) 13.0 - 17.0 g/dL   HCT 19.1 (*) 47.8 - 29.5 %   MCV 91.3  78.0 - 100.0 fL   MCH 30.5  26.0 - 34.0 pg   MCHC 33.5  30.0 - 36.0 g/dL   RDW 62.1  30.8 - 65.7 %   Platelets 195  150 - 400 K/uL  BASIC METABOLIC PANEL     Status: Abnormal   Collection Time    03/01/13  5:12 AM      Result Value Range   Sodium 140  135 - 145 mEq/L   Potassium 3.6  3.5 - 5.1 mEq/L   Chloride 104  96 - 112 mEq/L   CO2 24  19 - 32 mEq/L   Glucose, Bld 133 (*) 70 - 99 mg/dL   BUN 23  6 - 23 mg/dL   Creatinine, Ser 8.46 (*) 0.50 - 1.35 mg/dL   Calcium 8.1 (*) 8.4 - 10.5 mg/dL   GFR calc non Af Amer 45 (*) >90 mL/min   GFR calc Af Amer 52 (*) >90 mL/min   Comment:            The eGFR has been calculated     using the CKD EPI equation.     This calculation has not been     validated in all clinical     situations.     eGFR's persistently     <90 mL/min signify     possible  Chronic Kidney Disease.   Ct Abdomen Pelvis W Contrast  02/27/2013  *RADIOLOGY REPORT*  Clinical Data: Fever, right lower quadrant pain.  CT ABDOMEN AND PELVIS WITH CONTRAST  Technique:  Multidetector CT imaging of the abdomen and pelvis was performed following the standard protocol during bolus administration of intravenous contrast.  Contrast: OMNIPAQUE IOHEXOL 300 MG/ML  SOLN  Comparison: None.  Findings: Minimal scarring or atelectasis within the lower lungs. Heart size upper normal to mildly enlarged.  Coronary artery calcification.  Unremarkable liver, biliary system, spleen, pancreas, adrenal glands.  Symmetric renal enhancement.  No hydronephrosis or  hydroureter.  Colonic diverticulosis.  No CT evidence for colitis or diverticulitis.  Normal appendix.  Small hiatal hernia.  6.2 x 5.7 cm heterogeneous attenuation mass within the right anterior lower abdomen, intimately associated with a proximal ileal loop of small bowel.  There is mild adjacent stranding of the mesenteric fat and thickening of the anterior peritoneum as seen on images 67 - 69 as index.  No free intraperitoneal air or fluid.  No lymphadenopathy.  There is scattered atherosclerotic calcification of the aorta and its branches. No aneurysmal dilatation.  Partially decompressed bladder.  Fat containing left inguinal hernia.  Multilevel degenerative changes of the imaged spine. No acute or aggressive appearing osseous lesion.  IMPRESSION: 6.2 x 5.7 cm heterogeneous attenuation mass within the right lower abdomen, intimately associated with a loop of proximal ileum. Internal areas of low attenuation favored to reflect necrosis. Favored differential includes a small bowel stromal tumor, metastatic disease, or lymphoma. Recommend tissue sampling.  Adjacent mesenteric fat stranding and peritoneal thickening may reflect local spread or superimposed infection.   Original Report Authenticated By: Jearld Lesch, M.D.    Dg Chest Port 1 View  02/27/2013  *RADIOLOGY REPORT*  Clinical Data: Fever, fall  PORTABLE CHEST - 1 VIEW  Comparison: Prior chest x-ray 11/09/2011  Findings: Left retrocardiac opacity.  Given portable frontal technique, cardiac and mediastinal contours remain within normal limits.  Aortic atherosclerosis noted.  No effusion, or pneumothorax.  No edema.  No acute osseous abnormality.  IMPRESSION: Left retrocardiac opacity may reflect atelectasis and / or infiltrate.   Original Report Authenticated By: Malachy Moan, M.D.     Review of Systems  Constitutional: Negative for fever and chills.  Respiratory: Negative for cough and shortness of breath.   Cardiovascular: Negative for  chest pain.  Gastrointestinal: Positive for abdominal pain. Negative for nausea and vomiting.  Neurological: Positive for dizziness and weakness. Negative for headaches.    Blood pressure 110/53, pulse 93, temperature 98.3 F (36.8 C), temperature source Oral, resp. rate 18, height 5\' 7"  (1.702 m), weight 177 lb 11.2 oz (80.604 kg), SpO2 91.00%. Physical Exam  Constitutional: He is oriented to person, place, and time.  Cardiovascular: Normal rate, regular rhythm and normal heart sounds.   No murmur heard. Respiratory: Effort normal and breath sounds normal. He has no wheezes.  GI: Soft. Bowel sounds are normal. There is no tenderness.  Musculoskeletal: Normal range of motion.  Neurological: He is alert and oriented to person, place, and time.  Psychiatric: He has a normal mood and affect. His behavior is normal. Judgment and thought content normal.     Assessment/Plan RLQ mass Scheduled for bx today Pt aware of procedure benefits and risks and agreeable to proceed Consent signed and in chart  TURPIN,PAMELA A 03/01/2013, 10:31 AM

## 2013-03-02 DIAGNOSIS — J189 Pneumonia, unspecified organism: Secondary | ICD-10-CM

## 2013-03-02 LAB — URINE CULTURE

## 2013-03-02 MED ORDER — LEVOFLOXACIN IN D5W 750 MG/150ML IV SOLN
750.0000 mg | INTRAVENOUS | Status: DC
  Administered 2013-03-02 – 2013-03-04 (×2): 750 mg via INTRAVENOUS
  Filled 2013-03-02 (×2): qty 150

## 2013-03-02 NOTE — Progress Notes (Signed)
Pt O2 sats in the upper 80's this am while resting in bed. Pt placed on 2L of oxygen. O2 sat now 91, will continue to monitor. No complaints of SOB.

## 2013-03-02 NOTE — Progress Notes (Signed)
79yo male admitted yesterday for weakness and dizziness, found with RLQ mass, now s/p Bx, this am O2 sats in 80s, to begin IV ABX for possible PNA.  Will start Levaquin 750mg  IV Q48H for CrCl ~40 ml/min.  Vernard Gambles, PharmD, BCPS 03/02/2013 6:36 AM

## 2013-03-02 NOTE — Progress Notes (Signed)
Patient ID: [name] [name], male   DOB: 1929-05-05, 79 y.o.   MRN: [id number] Chart reviewed. Status post biopsy. Echocardiogram demonstrates normal left ventricular systolic function with apical hypokinesia. No change in recommendations.

## 2013-03-02 NOTE — Progress Notes (Signed)
[facility] PROGRESS NOTE  Maico Mulvehill NGE:[id number] DOB: 1929/04/24 DOA: 02/27/2013 PCP: Lenora Boys, MD  Brief narrative: 79 y.o. male who presented to [facility] ED 02/27/13 with complaints of feeling weak and dizzy. Patient reported no loss of consciousness, no chest pain, no palpitations. He did report having fevers and chills started 1 day prior to this admission.  In ED, patient was hypotensive with BP 79/29. Further evaluation included CXR which showed left retrocardiac opacity. 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 Biopsy performed, awaiting results. Urinalysis was positive for trace leukocyte esterases. In addition, his BMET revealed creatinine of 1.49 (there was no previous value for comparison). Cardiac enzymes were elevated initially at 0.98. His CBC revealed hemoglobin of 8.3.   Assessment/Plan:   Principal Problem:  *Near syncope  Unclear etiology, possible sepsis in the setting of elevated procalcitonin.  BP stable at 110/53.  Cardiac enzymes 0.98 -->2.3 --> 5.27 --> 6.16. Appreciate cardiology following. 2 D ECHO on this admission with EF 55%  Active Problems:  Abdominal mass, RLQ (right lower quadrant)  Follow up biopsy of RLQ abdominal mass; appreciate radiology consultation. NSTEMI (non-ST elevated myocardial infarction)  Continue aspirin and simvastatin  Appreciate cardiology following Anemia  Unclear etiology  Hemoglobin 8.9 on admission and repeat value stable at 8.4  Will wait for results of biopsy before GI consulted Acute kidney failure  Dehydration from an acute infection, UTI, pneumonia  Lower the rate of IV fluids, KVO Creatinine trending down 1.51 --> 1.49 --> 1.42 UTI (urinary tract infection)  Urinalysis with small amount of leukocyte esterases.  Urine  culture no growth Switch to Levaquin  today CAP (community acquired pneumonia)  Follow up blood culture results - no growth to date Switch to Levaquin today  Code Status: full code  Family Communication: family (son) at bedside  Disposition Plan: home when stable   Manson Passey, MD  [facility]  Pager [phone]   Consultants:  Cardiology (Dr. Tenny Craw, Gunnar Fusi)  Radiology for biopsy Procedures:  Biopsy of RLQ mass done 03/01/13 Antibiotics:  Vanco 02/27/13 --> 03/02/13 Zosyn 02/28/2013 --> 03/02/13 Levaquin 03/02/13 -->   If 7PM-7AM, please contact night-coverage [email or web address] Password [email or web address] 03/02/2013, 6:15 AM   LOS: 3 days   HPI/Subjective: No acute overnight events.  Objective: Filed Vitals:   03/01/13 1637 03/01/13 1700 03/01/13 2036 03/02/13 0556  BP: 130/69 124/72 107/44 124/56  Pulse: 97  95 95  Temp:   99 F (37.2 C) 100 F (37.8 C)  TempSrc:   Oral Oral  Resp: 20  20 16   Height:      Weight:      SpO2: 96% 96% 90% 91%    Intake/Output Summary (Last 24 hours) at 03/02/13 0615 Last data filed at 03/02/13 0600  Gross per 24 hour  Intake    350 ml  Output    651 ml  Net   -301 ml    Exam:   General:  Pt is alert, follows commands appropriately, not in acute distress  Cardiovascular: Regular rate and rhythm, S1/S2, no murmurs, no rubs, no gallops  Respiratory: Clear to auscultation bilaterally, no wheezing, no crackles, no rhonchi  Abdomen: Soft, non tender,  distended, bowel sounds present, no guarding  Extremities: No edema, pulses DP and PT palpable bilaterally  Neuro: Grossly nonfocal  Data Reviewed: Basic Metabolic Panel:  Recent Labs Lab 02/27/13 2022 02/28/13  1610 03/01/13 0512  NA 141 138 140  K 3.5 3.5 3.6  CL 104 102 104  CO2 24 23 24   GLUCOSE 135* 124* 133*  BUN 25* 24* 23  CREATININE 1.51* 1.49* 1.42*  CALCIUM 8.6 8.0* 8.1*   Liver Function Tests:  Recent Labs Lab 02/27/13 2022 02/28/13 0212  AST 23 28  ALT 20 19  ALKPHOS 70 69  BILITOT 0.5 0.7  PROT 6.7 5.9*   ALBUMIN 3.3* 2.8*    Recent Labs Lab 02/27/13 2022  LIPASE 26   No results found for this basename: AMMONIA,  in the last 168 hours CBC:  Recent Labs Lab 02/27/13 2022 02/28/13 0212 03/01/13 0512  WBC 14.0* 8.9 12.1*  NEUTROABS 12.3*  --   --   HGB 8.9* 8.3* 8.4*  HCT 26.5* 24.3* 25.1*  MCV 90.4 91.0 91.3  PLT 237 201 195   Cardiac Enzymes:  Recent Labs Lab 02/27/13 2022 02/28/13 0212 02/28/13 1010 02/28/13 1245  TROPONINI 0.98* 2.30* 5.27* 6.16*   BNP: No components found with this basename: POCBNP,  CBG: No results found for this basename: GLUCAP,  in the last 168 hours  Recent Results (from the past 240 hour(s))  CULTURE, BLOOD (ROUTINE X 2)     Status: None   Collection Time    02/27/13  9:30 PM      Result Value Range Status   Specimen Description BLOOD RIGHT HAND   Final   Special Requests BOTTLES DRAWN AEROBIC AND ANAEROBIC 10CC EACH   Final   Culture  Setup Time 02/28/2013 02:38   Final   Culture     Final   Value:        BLOOD CULTURE RECEIVED NO GROWTH TO DATE CULTURE WILL BE HELD FOR 5 DAYS BEFORE ISSUING A FINAL NEGATIVE REPORT   Report Status PENDING   Incomplete  CULTURE, BLOOD (ROUTINE X 2)     Status: None   Collection Time    02/27/13  9:35 PM      Result Value Range Status   Specimen Description BLOOD RIGHT ARM   Final   Special Requests BOTTLES DRAWN AEROBIC AND ANAEROBIC 10CC EACH   Final   Culture  Setup Time 02/28/2013 02:41   Final   Culture     Final   Value:        BLOOD CULTURE RECEIVED NO GROWTH TO DATE CULTURE WILL BE HELD FOR 5 DAYS BEFORE ISSUING A FINAL NEGATIVE REPORT   Report Status PENDING   Incomplete  URINE CULTURE     Status: None   Collection Time    02/28/13  4:51 PM      Result Value Range Status   Specimen Description URINE, CLEAN CATCH   Final   Special Requests NONE   Final   Culture  Setup Time 03/01/2013 03:29   Final   Colony Count NO GROWTH   Final   Culture NO GROWTH   Final   Report Status  03/02/2013 FINAL   Final     Studies: Ct Biopsy  03/01/2013  *RADIOLOGY REPORT*  Clinical history:79 year old with a right lower quadrant abdominal lesion.  PROCEDURE(S): CT GUIDED BIOPSY/ASPIRATION OF THE RIGHT LOWER QUADRANT LESION  Physician: Rachelle Hora. Henn, MD  Medications:Versed 2 mg, Fentanyl 75 mcg. A radiology nurse monitored the patient for moderate sedation.  Moderate sedation time:35 minutes  Fluoroscopy time: 15 seconds of CT fluoroscopy  Procedure:The procedure was explained to the patient.  The risks and benefits of  the procedure were discussed and the patient's questions were addressed.  Informed consent was obtained from the patient.  The patient was placed supine.  CT images of the lower abdomen were obtained.  Percutaneous window was identified along the anterior lower abdomen.  The skin was prepped and draped in a sterile fashion.  Skin was anesthetized with 1% lidocaine.  17 gauge needle was directed into this hyperdense lesion with CT fluoroscopy.  Bloody fluid was sent immediately drained from the needle once the stylet was removed.  20 ml of bloody fluid was initially removed.  A core biopsy was performed with an 18 gauge device but minimal tissue was obtained.  Subsequently, additional bloody fluid was aspirated.  A total of 60 ml of bloody fluid was removed.  17 gauge needle was removed without complication.  Findings:Large hyperdense lesion in the anterior right lower abdomen measuring up to 6.6 cm. Needle was directed into the lesion with CT fluoroscopy.  Bloody fluid was immediately drained from the needle.  60 ml of bloody fluid was removed.  Small amount of gas was within the lesion following removal of the needle. This finding suggests that the lesion may be a multiloculated collection.  The lesion was slightly smaller in size after aspiration.  Complications: None  Impression:CT guided aspiration and biopsy of the abdominal lesion in the right lower abdomen.  A large amount of  bloody fluid was aspirated from this lesion.  The lesion is hyperdense and heterogeneous.  This could represent a complex hematoma.  Samples were sent for pathology, cytology and culture.   Original Report Authenticated By: Richarda Overlie, M.D.     Scheduled Meds: . aspirin  325 mg Oral Daily  . enoxaparin (LOVENOX) injection  40 mg Subcutaneous Q24H  . piperacillin-tazobactam (ZOSYN)  IV  3.375 g Intravenous Q8H  . simvastatin  20 mg Oral QPM  . sodium chloride  3 mL Intravenous Q12H  . vancomycin  1,000 mg Intravenous Q24H  . zolpidem  5 mg Oral QHS

## 2013-03-03 NOTE — Progress Notes (Signed)
Pt up ambulating in hallway pushing wheelchair at this time; son and wife ambulating with pt; no c/o dizziness or SOB; will cont. To monitor.

## 2013-03-03 NOTE — Progress Notes (Signed)
[facility] PROGRESS NOTE  Boen Sterbenz OZH:[id number] DOB: [birth year] DOA: 02/27/2013 PCP: Lenora Boys, MD  Brief narrative: 79 y.o. male who presented to [facility] ED 02/27/13 with complaints of feeling weak and dizzy. Patient reported no loss of consciousness, no chest pain, no palpitations. He did report having fevers and chills started 1 day prior to this admission.  In ED, patient was hypotensive with BP 79/29. Further evaluation included CXR which showed left retrocardiac opacity. 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 Biopsy performed, awaiting results. Urinalysis was positive for trace leukocyte esterases. In addition, his BMET revealed creatinine of 1.49 (there was no previous value for comparison). Cardiac enzymes were elevated initially at 0.98 and trended up 6.16. His CBC revealed hemoglobin of 8.3.  At this time we are awaiting final results of RLQ abdominal mass biopsy. The troponin level today down to 2.73.  Assessment/Plan:   Principal Problem:  *Near syncope  Unclear etiology, possible sepsis in the setting of elevated procalcitonin (5.88).  BP stable at 130/68 Cardiac enzymes 0.98 -->2.3 --> 5.27 --> 6.16 -->2.73. Appreciate cardiology following. 2 D ECHO on this admission with EF 55%. Spoke with Dr. Elease Hashimoto on call and wasn't sure if plan would be for Myoview study while inpatient. Active Problems:  Abdominal mass, RLQ (right lower quadrant)  Follow up biopsy of RLQ abdominal mass; appreciate radiology consultation. 60 cc of bloody fluid aspirated. NSTEMI (non-ST elevated myocardial infarction)  Continue aspirin and simvastatin  Appreciate cardiology following Anemia  Unclear etiology  Hemoglobin 8.9 on admission and repeat value stable at 8.4  Acute kidney failure  Dehydration from an acute infection, UTI, pneumonia   Creatinine trending down 1.51 --> 1.49 --> 1.42 Follow up BMP in am UTI (urinary tract infection)  Urinalysis with small amount of leukocyte esterases.  Urine culture no growth  Switched to Levaquin 03/02/13 CAP (community acquired pneumonia)  Follow up blood culture results - no growth to date  Switched to Levaquin 03/02/13  Code Status: full code  Family Communication: family (son) at bedside  Disposition Plan: home when stable   Manson Passey, MD  [facility]  Pager [phone]   Consultants:  Cardiology (Dr. Tenny Craw, Gunnar Fusi)  Radiology for biopsy Procedures:  Biopsy of RLQ mass done 03/01/13 Antibiotics:  Vanco 02/27/13 --> 03/02/13  Zosyn 02/28/2013 --> 03/02/13  Levaquin 03/02/13 -->     If 7PM-7AM, please contact night-coverage [email or web address] Password [facility] 03/03/2013, 6:14 AM   LOS: 4 days    HPI/Subjective: No acute overnight events.  Objective: Filed Vitals:   03/02/13 0738 03/02/13 1352 03/02/13 2048 03/03/13 0431  BP:  136/67 144/60 130/68  Pulse:  84 87 88  Temp:  97.9 F (36.6 C) 97.9 F (36.6 C) 98.5 F (36.9 C)  TempSrc:  Oral Oral Oral  Resp:  17 18 18   Height:      Weight:      SpO2: 93% 96% 98% 97%    Intake/Output Summary (Last 24 hours) at 03/03/13 1324 Last data filed at 03/03/13 0433  Gross per 24 hour  Intake    630 ml  Output    854 ml  Net   -224 ml    Exam:   General:  Pt is alert, follows commands appropriately, not in acute distress  Cardiovascular: Regular rate and rhythm, S1/S2, no murmurs, no rubs, no gallops  Respiratory: Clear to auscultation  bilaterally, no wheezing, no crackles, no rhonchi  Abdomen: Soft, non tender,  distended, bowel sounds present, no guarding  Extremities: No edema, pulses DP and PT palpable bilaterally  Neuro: Grossly nonfocal  Data Reviewed: Basic Metabolic Panel:  Recent Labs Lab 02/27/13 2022 02/28/13 0212 03/01/13 0512  NA 141 138 140  K 3.5 3.5 3.6  CL 104 102 104  CO2 24 23 24   GLUCOSE  135* 124* 133*  BUN 25* 24* 23  CREATININE 1.51* 1.49* 1.42*  CALCIUM 8.6 8.0* 8.1*   Liver Function Tests:  Recent Labs Lab 02/27/13 2022 02/28/13 0212  AST 23 28  ALT 20 19  ALKPHOS 70 69  BILITOT 0.5 0.7  PROT 6.7 5.9*  ALBUMIN 3.3* 2.8*    Recent Labs Lab 02/27/13 2022  LIPASE 26   No results found for this basename: AMMONIA,  in the last 168 hours CBC:  Recent Labs Lab 02/27/13 2022 02/28/13 0212 03/01/13 0512  WBC 14.0* 8.9 12.1*  NEUTROABS 12.3*  --   --   HGB 8.9* 8.3* 8.4*  HCT 26.5* 24.3* 25.1*  MCV 90.4 91.0 91.3  PLT 237 201 195   Cardiac Enzymes:  Recent Labs Lab 02/27/13 2022 02/28/13 0212 02/28/13 1010 02/28/13 1245  TROPONINI 0.98* 2.30* 5.27* 6.16*   BNP: No components found with this basename: POCBNP,  CBG: No results found for this basename: GLUCAP,  in the last 168 hours  Recent Results (from the past 240 hour(s))  CULTURE, BLOOD (ROUTINE X 2)     Status: None   Collection Time    02/27/13  9:30 PM      Result Value Range Status   Specimen Description BLOOD RIGHT HAND   Final   Special Requests BOTTLES DRAWN AEROBIC AND ANAEROBIC 10CC EACH   Final   Culture  Setup Time 02/28/2013 02:38   Final   Culture     Final   Value:        BLOOD CULTURE RECEIVED NO GROWTH TO DATE CULTURE WILL BE HELD FOR 5 DAYS BEFORE ISSUING A FINAL NEGATIVE REPORT   Report Status PENDING   Incomplete  CULTURE, BLOOD (ROUTINE X 2)     Status: None   Collection Time    02/27/13  9:35 PM      Result Value Range Status   Specimen Description BLOOD RIGHT ARM   Final   Special Requests BOTTLES DRAWN AEROBIC AND ANAEROBIC 10CC EACH   Final   Culture  Setup Time 02/28/2013 02:41   Final   Culture     Final   Value:        BLOOD CULTURE RECEIVED NO GROWTH TO DATE CULTURE WILL BE HELD FOR 5 DAYS BEFORE ISSUING A FINAL NEGATIVE REPORT   Report Status PENDING   Incomplete  URINE CULTURE     Status: None   Collection Time    02/28/13  4:51 PM      Result  Value Range Status   Specimen Description URINE, CLEAN CATCH   Final   Special Requests NONE   Final   Culture  Setup Time 03/01/2013 03:29   Final   Colony Count NO GROWTH   Final   Culture NO GROWTH   Final   Report Status 03/02/2013 FINAL   Final  CULTURE, ROUTINE-ABSCESS     Status: None   Collection Time    03/01/13  2:32 PM      Result Value Range Status   Specimen Description ABSCESS ABDOMEN  Final   Special Requests ABDOMEN MASS   Final   Gram Stain     Final   Value: ABUNDANT WBC PRESENT,BOTH PMN AND MONONUCLEAR     NO SQUAMOUS EPITHELIAL CELLS SEEN     NO ORGANISMS SEEN   Culture NO GROWTH 1 DAY   Final   Report Status PENDING   Incomplete     Studies: Ct Biopsy  03/01/2013  *RADIOLOGY REPORT*  Clinical history:79 year old with a right lower quadrant abdominal lesion.  PROCEDURE(S): CT GUIDED BIOPSY/ASPIRATION OF THE RIGHT LOWER QUADRANT LESION  Physician: Rachelle Hora. Henn, MD  Medications:Versed 2 mg, Fentanyl 75 mcg. A radiology nurse monitored the patient for moderate sedation.  Moderate sedation time:35 minutes  Fluoroscopy time: 15 seconds of CT fluoroscopy  Procedure:The procedure was explained to the patient.  The risks and benefits of the procedure were discussed and the patient's questions were addressed.  Informed consent was obtained from the patient.  The patient was placed supine.  CT images of the lower abdomen were obtained.  Percutaneous window was identified along the anterior lower abdomen.  The skin was prepped and draped in a sterile fashion.  Skin was anesthetized with 1% lidocaine.  17 gauge needle was directed into this hyperdense lesion with CT fluoroscopy.  Bloody fluid was sent immediately drained from the needle once the stylet was removed.  20 ml of bloody fluid was initially removed.  A core biopsy was performed with an 18 gauge device but minimal tissue was obtained.  Subsequently, additional bloody fluid was aspirated.  A total of 60 ml of bloody fluid was  removed.  17 gauge needle was removed without complication.  Findings:Large hyperdense lesion in the anterior right lower abdomen measuring up to 6.6 cm. Needle was directed into the lesion with CT fluoroscopy.  Bloody fluid was immediately drained from the needle.  60 ml of bloody fluid was removed.  Small amount of gas was within the lesion following removal of the needle. This finding suggests that the lesion may be a multiloculated collection.  The lesion was slightly smaller in size after aspiration.  Complications: None  Impression:CT guided aspiration and biopsy of the abdominal lesion in the right lower abdomen.  A large amount of bloody fluid was aspirated from this lesion.  The lesion is hyperdense and heterogeneous.  This could represent a complex hematoma.  Samples were sent for pathology, cytology and culture.   Original Report Authenticated By: Richarda Overlie, M.D.     Scheduled Meds: . aspirin  325 mg Oral Daily  . enoxaparin (LOVENOX) injection  40 mg Subcutaneous Q24H  . levofloxacin (LEVAQUIN) IV  750 mg Intravenous Q48H  . simvastatin  20 mg Oral QPM  . sodium chloride  3 mL Intravenous Q12H  . zolpidem  5 mg Oral QHS   Continuous Infusions:

## 2013-03-03 NOTE — Progress Notes (Signed)
Pt sitting up in chair at this time; wife in room; will cont. To monitor.

## 2013-03-04 LAB — CBC
HCT: 26 % — ABNORMAL LOW (ref 39.0–52.0)
Hemoglobin: 8.7 g/dL — ABNORMAL LOW (ref 13.0–17.0)
MCHC: 33.5 g/dL (ref 30.0–36.0)
MCV: 89 fL (ref 78.0–100.0)
RDW: 13.2 % (ref 11.5–15.5)
WBC: 5.8 10*3/uL (ref 4.0–10.5)

## 2013-03-04 LAB — CULTURE, ROUTINE-ABSCESS: Culture: NO GROWTH

## 2013-03-04 LAB — BASIC METABOLIC PANEL
BUN: 18 mg/dL (ref 6–23)
BUN: 18 mg/dL (ref 6–23)
CO2: 25 mEq/L (ref 19–32)
Calcium: 8.6 mg/dL (ref 8.4–10.5)
Chloride: 109 mEq/L (ref 96–112)
Creatinine, Ser: 0.97 mg/dL (ref 0.50–1.35)
Creatinine, Ser: 0.98 mg/dL (ref 0.50–1.35)
Glucose, Bld: 191 mg/dL — ABNORMAL HIGH (ref 70–99)
Glucose, Bld: 170 mg/dL — ABNORMAL HIGH (ref 70–99)
Potassium: 3 mEq/L — ABNORMAL LOW (ref 3.5–5.1)
Sodium: 143 mEq/L (ref 135–145)

## 2013-03-04 LAB — PROCALCITONIN: Procalcitonin: 1.12 ng/mL

## 2013-03-04 MED ORDER — POTASSIUM CHLORIDE CRYS ER 20 MEQ PO TBCR
40.0000 meq | EXTENDED_RELEASE_TABLET | Freq: Once | ORAL | Status: AC
  Administered 2013-03-04: 40 meq via ORAL
  Filled 2013-03-04: qty 2

## 2013-03-04 MED ORDER — SODIUM CHLORIDE 0.9 % IJ SOLN: 3.0000 mL | INTRAMUSCULAR | Status: DC | PRN

## 2013-03-04 MED ORDER — SODIUM CHLORIDE 0.9 % IV SOLN
1.0000 mL/kg/h | INTRAVENOUS | Status: DC
  Administered 2013-03-05: 1 mL/kg/h via INTRAVENOUS

## 2013-03-04 MED ORDER — PNEUMOCOCCAL VAC POLYVALENT 25 MCG/0.5ML IJ INJ
0.5000 mL | INJECTION | INTRAMUSCULAR | Status: AC
  Filled 2013-03-04: qty 0.5

## 2013-03-04 MED ORDER — LEVOFLOXACIN 750 MG PO TABS
750.0000 mg | ORAL_TABLET | Freq: Every day | ORAL | Status: AC
  Administered 2013-03-05: 750 mg via ORAL
  Filled 2013-03-04: qty 1

## 2013-03-04 MED ORDER — ASPIRIN 81 MG PO CHEW
324.0000 mg | CHEWABLE_TABLET | ORAL | Status: AC
  Administered 2013-03-05: 324 mg via ORAL
  Filled 2013-03-04: qty 4

## 2013-03-04 MED ORDER — SODIUM CHLORIDE 0.9 % IV SOLN: 250.0000 mL | INTRAVENOUS | Status: DC | PRN

## 2013-03-04 MED ORDER — SODIUM CHLORIDE 0.9 % IJ SOLN
3.0000 mL | Freq: Two times a day (BID) | INTRAMUSCULAR | Status: DC
  Administered 2013-03-04: 3 mL via INTRAVENOUS

## 2013-03-04 NOTE — Progress Notes (Signed)
Subjective: Patient denies CP  No SOB Objective: Filed Vitals:   03/03/13 0431 03/03/13 1348 03/03/13 2019 03/04/13 0452  BP: 130/68 136/69 133/66 113/57  Pulse: 88 83 82 74  Temp: 98.5 F (36.9 C) 98.7 F (37.1 C) 99.2 F (37.3 C) 98.6 F (37 C)  TempSrc: Oral Oral Oral Oral  Resp: 18 18 18 18   Height:      Weight:      SpO2: 97% 95% 96% 93%   Weight change:   Intake/Output Summary (Last 24 hours) at 03/04/13 0747 Last data filed at 03/03/13 1817  Gross per 24 hour  Intake    360 ml  Output      0 ml  Net    360 ml    General: Alert, awake, oriented x3, in no acute distress Neck:  JVP is normal Heart: Regular rate and rhythm, without murmurs, rubs, gallops.  Lungs: Clear to auscultation.  No rales or wheezes. Exemities:  No edema.   Neuro: Grossly intact, nonfocal.  Tele:  SR. Lab Results: Results for orders placed during the hospital encounter of 02/27/13 (from the past 24 hour(s))  TROPONIN I     Status: Abnormal   Collection Time    03/03/13 11:43 AM      Result Value Range   Troponin I 2.73 (*) <0.30 ng/mL  PROCALCITONIN     Status: None   Collection Time    03/04/13  5:57 AM      Result Value Range   Procalcitonin 1.12      Studies/Results: @[id number] @  Medications: REviewed   Imp 1.  NSTEMI  I have discussed with patient troponin elevation  Echo shows overall normal LVEF with mild anteroapical hypokinesis The patient will need a L heart cath before any surgery or before D/C  QUestion is timing He remains asymptomatic.  Bx pending Will check labs and hydrate today.    2.  Abdomenal mass  Awating Bx results.  LOS: 5 days   Dietrich Pates 03/04/2013, 7:47 AM

## 2013-03-04 NOTE — Progress Notes (Addendum)
[facility] PROGRESS NOTE  [name] [name] UEA:[id number] DOB: Feb 08, 1929 DOA: 02/27/2013 PCP: Lenora Boys, MD  Brief narrative: 79 y.o. male who presented to [facility] ED 02/27/13 with complaints of feeling weak and dizzy. Patient reported no loss of consciousness, no chest pain, no palpitations. He did report having fevers and chills started 1 day prior to this admission.  In ED, patient was hypotensive with BP 79/29. Further evaluation included CXR which showed left retrocardiac opacity. 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 Biopsy performed, awaiting results. Urinalysis was positive for trace leukocyte esterases. In addition, his BMET revealed creatinine of 1.49 (there was no previous value for comparison). Cardiac enzymes were elevated initially at 0.98 and trended up 6.16. His CBC revealed hemoglobin of 8.3.  At this time we are awaiting final results of RLQ abdominal mass biopsy. The troponin level down to 2.73 (03/03/2013).  Assessment/Plan:   Principal Problem:  *Near syncope  Unclear etiology, possible sepsis in the setting of elevated procalcitonin (5.88). All cultures neg to date. No influenza panel sent. Improved BP stable- was hypotensive on admission Cardiac enzymes 0.98 -->2.3 --> 5.27 --> 6.16 -->2.73. Appreciate cardiology following. 2 D ECHO on this admission with EF 55%. Cardiology is considering diagnostic cath.    Active Problems:  Abdominal mass, RLQ (right lower quadrant)  Follow up biopsy of RLQ abdominal mass; appreciate radiology consultation. 60 cc of bloody fluid aspirated. Discussed with pathology, Dr. Raynald Blend on 3/17: indicated that the specimen was non diagnostic- ? Inadequate sample & recommended re biopsy- discussed with IR. Cytology results: pending. Discussed with Dr. Lowella Dandy, IR who indicated that previous  biopsy only yielded bloody fluid suggestive of hematoma. He recommends repeat CT Abdomen in a couple of weeks to follow up of possible hematoma. Also follow up cytology results.  NSTEMI (non-ST elevated myocardial infarction)  Continue aspirin and simvastatin  Appreciate cardiology following  Echo: EF 55%. Mild apical anterior hypokinesis, grade 1 diastolic dysfunction. Cards planning hydration and possible cath  Anemia  Unclear etiology  Hemoglobin 8.9 on admission and repeat value stable at 8.4 (03/01/13) Repeat CBC today. FOBT neg on 02/27/13 Anemia panel suggestive of Iron deficiency & ? B12 deficiency. Consider starting Iron supplements on d/c. Will need GI work up at some point, if not already done.  Acute kidney failure  Dehydration from an acute infection, UTI, pneumonia  Creatinine trending down 1.51 --> 1.49 --> 1.42 Resolved (creat 0.97 on 3/17)   UTI (urinary tract infection)  Urinalysis with small amount of leukocyte esterases.  Urine culture no growth  UA quite unimpressive. D/C Abx for this indication.  CAP (community acquired pneumonia)  Follow up blood culture results - no growth to date  Switched to Levaquin 03/02/13 - would d/c after 3/18 doses.  Code Status: Full code  Family Communication: Discussed with patient. Disposition Plan: home when stable    Consultants:  Cardiology (Dr. Tenny Craw, [facility])  Radiology for biopsy  Procedures:  Biopsy of RLQ mass done 03/01/13  Antibiotics:  Vanco 02/27/13 --> 03/02/13  Zosyn 02/28/2013 --> 03/02/13  Levaquin 03/02/13 -->     HPI/Subjective: No acute overnight events. Ambulating and eating well without pain, dyspnea, dizziness or lightheadedness.  Objective: Filed Vitals:   03/03/13 0431 03/03/13 1348 03/03/13 2019 03/04/13 0452  BP: 130/68 136/69 133/66 113/57  Pulse: 88 83 82 74  Temp: 98.5 F (36.9 C)  98.7 F (37.1 C) 99.2 F (37.3 C) 98.6 F (37 C)  TempSrc: Oral Oral Oral Oral  Resp: 18 18 18 18    Height:      Weight:      SpO2: 97% 95% 96% 93%    Intake/Output Summary (Last 24 hours) at 03/04/13 0811 Last data filed at 03/03/13 1817  Gross per 24 hour  Intake    360 ml  Output      0 ml  Net    360 ml    Exam:   General:  Lying comfortably supine in bed.  Cardiovascular: Regular rate and rhythm, S1/S2, no murmurs, no rubs, no gallops. No pedal edema.  Respiratory: Clear to auscultation bilaterally, no wheezing, no crackles, no rhonchi. No increased work of breathing.  Abdomen: Soft, non tender,  distended, bowel sounds present, no guarding  Extremities: No edema, pulses DP and PT palpable bilaterally  Neuro: Grossly non focal. Alert and oriented.  Data Reviewed: Basic Metabolic Panel:  Recent Labs Lab 02/27/13 2022 02/28/13 0212 03/01/13 0512  NA 141 138 140  K 3.5 3.5 3.6  CL 104 102 104  CO2 24 23 24   GLUCOSE 135* 124* 133*  BUN 25* 24* 23  CREATININE 1.51* 1.49* 1.42*  CALCIUM 8.6 8.0* 8.1*   Liver Function Tests:  Recent Labs Lab 02/27/13 2022 02/28/13 0212  AST 23 28  ALT 20 19  ALKPHOS 70 69  BILITOT 0.5 0.7  PROT 6.7 5.9*  ALBUMIN 3.3* 2.8*    Recent Labs Lab 02/27/13 2022  LIPASE 26   No results found for this basename: AMMONIA,  in the last 168 hours CBC:  Recent Labs Lab 02/27/13 2022 02/28/13 0212 03/01/13 0512  WBC 14.0* 8.9 12.1*  NEUTROABS 12.3*  --   --   HGB 8.9* 8.3* 8.4*  HCT 26.5* 24.3* 25.1*  MCV 90.4 91.0 91.3  PLT 237 201 195   Cardiac Enzymes:  Recent Labs Lab 02/27/13 2022 02/28/13 0212 02/28/13 1010 02/28/13 1245 03/03/13 1143  TROPONINI 0.98* 2.30* 5.27* 6.16* 2.73*   BNP: No components found with this basename: POCBNP,  CBG: No results found for this basename: GLUCAP,  in the last 168 hours  Recent Results (from the past 240 hour(s))  CULTURE, BLOOD (ROUTINE X 2)     Status: None   Collection Time    02/27/13  9:30 PM      Result Value Range Status   Specimen Description BLOOD  RIGHT HAND   Final   Special Requests BOTTLES DRAWN AEROBIC AND ANAEROBIC 10CC EACH   Final   Culture  Setup Time 02/28/2013 02:38   Final   Culture     Final   Value:        BLOOD CULTURE RECEIVED NO GROWTH TO DATE CULTURE WILL BE HELD FOR 5 DAYS BEFORE ISSUING A FINAL NEGATIVE REPORT   Report Status PENDING   Incomplete  CULTURE, BLOOD (ROUTINE X 2)     Status: None   Collection Time    02/27/13  9:35 PM      Result Value Range Status   Specimen Description BLOOD RIGHT ARM   Final   Special Requests BOTTLES DRAWN AEROBIC AND ANAEROBIC 10CC EACH   Final   Culture  Setup Time 02/28/2013 02:41   Final   Culture     Final   Value:        BLOOD CULTURE RECEIVED NO GROWTH TO DATE CULTURE WILL BE HELD FOR 5 DAYS BEFORE  ISSUING A FINAL NEGATIVE REPORT   Report Status PENDING   Incomplete  URINE CULTURE     Status: None   Collection Time    02/28/13  4:51 PM      Result Value Range Status   Specimen Description URINE, CLEAN CATCH   Final   Special Requests NONE   Final   Culture  Setup Time 03/01/2013 03:29   Final   Colony Count NO GROWTH   Final   Culture NO GROWTH   Final   Report Status 03/02/2013 FINAL   Final  CULTURE, ROUTINE-ABSCESS     Status: None   Collection Time    03/01/13  2:32 PM      Result Value Range Status   Specimen Description ABSCESS ABDOMEN   Final   Special Requests ABDOMEN MASS   Final   Gram Stain     Final   Value: ABUNDANT WBC PRESENT,BOTH PMN AND MONONUCLEAR     NO SQUAMOUS EPITHELIAL CELLS SEEN     NO ORGANISMS SEEN   Culture NO GROWTH 2 DAYS   Final   Report Status PENDING   Incomplete     Studies: No results found.  Scheduled Meds: . aspirin  325 mg Oral Daily  . enoxaparin (LOVENOX) injection  40 mg Subcutaneous Q24H  . levofloxacin (LEVAQUIN) IV  750 mg Intravenous Q48H  . simvastatin  20 mg Oral QPM  . sodium chloride  3 mL Intravenous Q12H  . zolpidem  5 mg Oral QHS   Continuous Infusions:    Lejla Moeser Pager: [phone]  If  7PM-7AM, please contact night-coverage [email or web address] Password [email or web address] 03/04/2013, 8:11 AM   LOS: 5 days

## 2013-03-04 NOTE — Progress Notes (Signed)
Pharmacy: [name] was previously on vanc/zosyn/levaquin and is now on levaquin monotherapy for CAP.  Abx total day # 5.  AF.  WBC down to 5.8 from 12.1.  Per MD notes, levaquin to stop after 3/18 dose. Creat has improved to 0.97 from 1.42 and creat cl ~ 60 ml/min.  3/12 BC x 2 NG to date 3/13 urine NG F 3/14 abd mass NG F  Plan: change levaquin 750 IV q48 to levaquin 750 PO q24 with next and last dose 3/18.  If longer course desired, please order Thanks Herby Abraham, Pharm.D. [phone] 03/04/2013 2:24 PM

## 2013-03-05 ENCOUNTER — Encounter (HOSPITAL_COMMUNITY)
Admission: EM | Disposition: A | Discharge status: Home / Self Care | Payer: Self-pay | Source: Home / Self Care | Attending: Internal Medicine

## 2013-03-05 DIAGNOSIS — I2510 Atherosclerotic heart disease of native coronary artery without angina pectoris: Secondary | ICD-10-CM

## 2013-03-05 HISTORY — PX: LEFT HEART CATHETERIZATION WITH CORONARY ANGIOGRAM: SHX5451

## 2013-03-05 HISTORY — PX: CARDIAC CATHETERIZATION: SHX172

## 2013-03-05 LAB — CBC
MCH: 29.5 pg (ref 26.0–34.0)
MCV: 90.5 fL (ref 78.0–100.0)
Platelets: 298 10*3/uL (ref 150–400)
RBC: 2.85 MIL/uL — ABNORMAL LOW (ref 4.22–5.81)

## 2013-03-05 LAB — BASIC METABOLIC PANEL
CO2: 24 mEq/L (ref 19–32)
Calcium: 8.5 mg/dL (ref 8.4–10.5)
Creatinine, Ser: 1.08 mg/dL (ref 0.50–1.35)
Glucose, Bld: 100 mg/dL — ABNORMAL HIGH (ref 70–99)

## 2013-03-05 SURGERY — LEFT HEART CATHETERIZATION WITH CORONARY ANGIOGRAM
Anesthesia: LOCAL

## 2013-03-05 MED ORDER — HEPARIN SODIUM (PORCINE) 1000 UNIT/ML IJ SOLN
INTRAMUSCULAR | Status: AC
  Filled 2013-03-05: qty 1

## 2013-03-05 MED ORDER — CIPROFLOXACIN HCL 500 MG PO TABS
500.0000 mg | ORAL_TABLET | Freq: Two times a day (BID) | ORAL | Status: DC
  Administered 2013-03-05 – 2013-03-06 (×2): 500 mg via ORAL
  Filled 2013-03-05 (×5): qty 1

## 2013-03-05 MED ORDER — SODIUM CHLORIDE 0.9 % IV SOLN
1.0000 mL/kg/h | INTRAVENOUS | Status: AC
  Administered 2013-03-05 (×2): 1 mL/kg/h via INTRAVENOUS

## 2013-03-05 MED ORDER — LIDOCAINE HCL (PF) 1 % IJ SOLN
INTRAMUSCULAR | Status: AC
  Filled 2013-03-05: qty 30

## 2013-03-05 MED ORDER — FENTANYL CITRATE 0.05 MG/ML IJ SOLN
INTRAMUSCULAR | Status: AC
  Filled 2013-03-05: qty 2

## 2013-03-05 MED ORDER — METRONIDAZOLE 500 MG PO TABS
500.0000 mg | ORAL_TABLET | Freq: Three times a day (TID) | ORAL | Status: DC
  Administered 2013-03-05 – 2013-03-06 (×2): 500 mg via ORAL
  Filled 2013-03-05 (×7): qty 1

## 2013-03-05 MED ORDER — HEPARIN (PORCINE) IN NACL 2-0.9 UNIT/ML-% IJ SOLN
INTRAMUSCULAR | Status: AC
  Filled 2013-03-05: qty 1000

## 2013-03-05 MED ORDER — VERAPAMIL HCL 2.5 MG/ML IV SOLN
INTRAVENOUS | Status: AC
  Filled 2013-03-05: qty 2

## 2013-03-05 MED ORDER — METOPROLOL TARTRATE 25 MG PO TABS
25.0000 mg | ORAL_TABLET | Freq: Two times a day (BID) | ORAL | Status: DC
  Administered 2013-03-05 – 2013-03-06 (×3): 25 mg via ORAL
  Filled 2013-03-05 (×4): qty 1

## 2013-03-05 MED ORDER — POTASSIUM CHLORIDE CRYS ER 20 MEQ PO TBCR
20.0000 meq | EXTENDED_RELEASE_TABLET | Freq: Once | ORAL | Status: AC
  Administered 2013-03-05: 20 meq via ORAL
  Filled 2013-03-05: qty 1

## 2013-03-05 MED ORDER — MIDAZOLAM HCL 2 MG/2ML IJ SOLN
INTRAMUSCULAR | Status: AC
  Filled 2013-03-05: qty 2

## 2013-03-05 NOTE — CV Procedure (Signed)
   Cardiac Catheterization Procedure Note  Name: [name] [name] MRN: [id number] DOB: 08-30-1929  Procedure: Left Heart Cath, Selective Coronary Angiography, LV angiography  Indication: 79 year old white male who presents with near-syncope and a non-ST elevation myocardial infarction. He also was found to have a large abdominal mass measuring 6 cm. Aspiration was performed of this lesion and pathology was nondiagnostic. Cytology is pending. There was concern that this was a complex hematoma.   Procedural Details: The right wrist was prepped, draped, and anesthetized with 1% lidocaine. Using the modified Seldinger technique, a 5 French sheath was introduced into the right radial artery. 3 mg of verapamil was administered through the sheath, weight-based unfractionated heparin was administered intravenously. Standard Judkins catheters were used for selective coronary angiography and left ventriculography. Catheter exchanges were performed over an exchange length guidewire. There were no immediate procedural complications. A TR band was used for radial hemostasis at the completion of the procedure.  The patient was transferred to the post catheterization recovery area for further monitoring.  Procedural Findings: Hemodynamics: AO 134/66 with a mean of 96 mmHg LV 131/21 mmHg  Coronary angiography: Coronary dominance: right  Left mainstem: The left main coronary is short with 30% stenosis.  Left anterior descending (LAD): The LAD is a large vessel that is occluded after the takeoff of a large first diagonal branch. There are excellent right to left collaterals. The first diagonal is without significant disease.  Left circumflex (LCx): The left circumflex coronary gives rise to 2 marginal branches before terminating in the AV groove. It has mild irregularities less than 20% proximally.  Right coronary artery (RCA): The right coronary is a large dominant vessel. It has 30-40% disease in the mid vessel.  It gives excellent collaterals through septal perforators to the LAD.  Left ventriculography: Left ventricular systolic function is overall well preserved. Ejection fraction is estimated at 50%. There is mild to moderate hypokinesis of the anterior wall. There is no significant mitral insufficiency.  Final Conclusions:   1. Single vessel obstructive coronary disease. 2. Well-preserved LV function with anterior wall motion abnormality.  Recommendations: The LAD supplies a large territory. There is clearly viable myocardium in this distribution. There are good collaterals from the RCA. The LAD has the appearance of a recent occlusion. The LAD appears suitable for PCI. Given the uncertainty regarding his abdominal mass I would recommend medical therapy initially with consideration of PCI in the near future once the issue of his abdominal mass has been resolved and the patient can safely be anticoagulated and placed on dual antiplatelet therapy.  [name] [facility] 03/05/2013, 8:49 AM

## 2013-03-05 NOTE — Progress Notes (Signed)
Pt to cath lab at this time

## 2013-03-05 NOTE — Interval H&P Note (Signed)
History and Physical Interval Note:  03/05/2013 8:03 AM  [name] [name]  has presented today for surgery, with the diagnosis of cp  The various methods of treatment have been discussed with the patient and family. After consideration of risks, benefits and other options for treatment, the patient has consented to  Procedure(s): LEFT HEART CATHETERIZATION WITH CORONARY ANGIOGRAM (N/A) as a surgical intervention .  The patient's history has been reviewed, patient examined, no change in status, stable for surgery.  I have reviewed the patient's chart and labs.  Questions were answered to the patient's satisfaction.     [name] [facility] 03/05/2013 8:03 AM

## 2013-03-05 NOTE — Progress Notes (Signed)
[facility] PROGRESS NOTE  [name] [name] WUJ:[id number] DOB: 12/21/1928 DOA: 02/27/2013 PCP: Lenora Boys, MD  Brief narrative: 79 y.o. male who presented to [facility] ED 02/27/13 with complaints of feeling weak and dizzy. Patient reported no loss of consciousness, no chest pain, no palpitations. He did report having fevers and chills started 1 day prior to this admission. In ED, patient was hypotensive with BP 79/29. Further evaluation included CXR which showed left retrocardiac opacity. 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 Biopsy performed, awaiting results. Urinalysis was positive for trace leukocyte esterases. In addition, his BMET revealed creatinine of 1.49 (there was no previous value for comparison). Cardiac enzymes were elevated initially at 0.98 and trended up 6.16. His CBC revealed hemoglobin of 8.3.   Assessment/Plan:   NSTEMI (non-ST elevated myocardial infarction)  Continue aspirin and simvastatin  Appreciate cardiology following  Echo: EF 55%. Mild apical anterior hypokinesis, grade 1 diastolic dysfunction. Cath 3/18, LAD lesion, but no PCI due to abdominal mass / hematoma  Near syncope  Likely due to #1 Cardiac enzymes 0.98 -->2.3 --> 5.27 --> 6.16 -->2.73. Appreciate cardiology following. 2 D ECHO on this admission with EF 55%. Cardiology is considering diagnostic cath.   Abdominal mass, RLQ (right lower quadrant)  Follow up biopsy of RLQ abdominal mass; appreciate radiology consultation. 60 cc of bloody fluid aspirated. Discussed with pathology, Dr. Raynald Blend on 3/17: indicated that the specimen was non diagnostic- ? Inadequate sample & recommended re biopsy- discussed with IR. Cytology results: pending. Discussed with Dr. Lowella Dandy, IR who indicated that previous biopsy only yielded bloody fluid suggestive of hematoma. He  recommends repeat CT Abdomen in a couple of weeks to follow up of possible hematoma. Also follow up cytology results. Patient will need close follow up for this as an outpatient for repeat imaging in 2 weeks and further decisions at that time. He is being followed by [facility].   Anemia  Unclear etiology  Hemoglobin 8.9 on admission and repeat value stable at 8.4 (03/01/13) Repeat CBC today. FOBT neg on 02/27/13 Anemia panel suggestive of Iron deficiency & ? B12 deficiency. Consider starting Iron supplements on d/c. Will need GI work up at some point, if not already done.  Acute kidney failure  Dehydration from an acute infection, UTI, pneumonia  Creatinine trending down 1.51 --> 1.49 --> 1.42 Resolved  Monitor post cardiac cath  UTI (urinary tract infection)  Urinalysis with small amount of leukocyte esterases.  Urine culture no growth  UA quite unimpressive. D/C Abx for this indication.  CAP (community acquired pneumonia)  Follow up blood culture results - no growth to date  Switched to Levaquin 03/02/13 - would d/c after 3/18 doses.  Code Status: Full code  Family Communication: Discussed with patient and wife at bedside. Disposition Plan: home when stable   Consultants:  Cardiology (Dr. Tenny Craw, [facility])  Radiology for biopsy  Procedures:  Biopsy of RLQ mass done 03/01/13  Antibiotics:  Vanco 02/27/13 --> 03/02/13  Zosyn 02/28/2013 --> 03/02/13  Levaquin 03/02/13 -->03/05/13  HPI/Subjective: - feels well after cath, no chest pain  Objective: Filed Vitals:   03/04/13 2040 03/05/13 0455 03/05/13 0748 03/05/13 1059  BP: 151/68 129/55  152/68  Pulse: 86 83 87 78  Temp: 97.4 F (36.3 C) 98.4 F (36.9 C)  98.4 F (36.9 C)  TempSrc: Oral Oral  Oral  Resp: 18  18  18  Height:      Weight:      SpO2: 99% 95%  99%    Intake/Output Summary (Last 24 hours) at 03/05/13 1104 Last data filed at 03/05/13 0900  Gross per 24 hour  Intake  841.8 ml  Output      0 ml  Net   841.8 ml   Exam:  General:  Lying comfortably supine in bed.  Cardiovascular: Regular rate and rhythm, S1/S2, no murmurs, no rubs, no gallops. No pedal edema.  Respiratory: Clear to auscultation bilaterally, no wheezing, no crackles, no rhonchi. No increased work of breathing.  Abdomen: Soft, non tender,  distended, bowel sounds present, no guarding  Extremities: No edema, pulses DP and PT palpable bilaterally  Neuro: Grossly non focal. Alert and oriented.  Data Reviewed: Basic Metabolic Panel:  Recent Labs Lab 02/28/13 0212 03/01/13 0512 03/04/13 0806 03/04/13 1413 03/05/13 0531  NA 138 140 144 143 144  K 3.5 3.6 3.0* 3.5 3.4*  CL 102 104 109 108 111  CO2 23 24 26 25 24   GLUCOSE 124* 133* 191* 170* 100*  BUN 24* 23 18 18 18   CREATININE 1.49* 1.42* 0.97 0.98 1.08  CALCIUM 8.0* 8.1* 8.6 8.6 8.5   Liver Function Tests:  Recent Labs Lab 02/27/13 2022 02/28/13 0212  AST 23 28  ALT 20 19  ALKPHOS 70 69  BILITOT 0.5 0.7  PROT 6.7 5.9*  ALBUMIN 3.3* 2.8*    Recent Labs Lab 02/27/13 2022  LIPASE 26   CBC:  Recent Labs Lab 02/27/13 2022 02/28/13 0212 03/01/13 0512 03/04/13 0806 03/05/13 0531  WBC 14.0* 8.9 12.1* 5.8 7.4  NEUTROABS 12.3*  --   --   --   --   HGB 8.9* 8.3* 8.4* 8.7* 8.4*  HCT 26.5* 24.3* 25.1* 26.0* 25.8*  MCV 90.4 91.0 91.3 89.0 90.5  PLT 237 201 195 270 298   Cardiac Enzymes:  Recent Labs Lab 02/27/13 2022 02/28/13 0212 02/28/13 1010 02/28/13 1245 03/03/13 1143  TROPONINI 0.98* 2.30* 5.27* 6.16* 2.73*    Recent Results (from the past 240 hour(s))  CULTURE, BLOOD (ROUTINE X 2)     Status: None   Collection Time    02/27/13  9:30 PM      Result Value Range Status   Specimen Description BLOOD RIGHT HAND   Final   Special Requests BOTTLES DRAWN AEROBIC AND ANAEROBIC 10CC EACH   Final   Culture  Setup Time 02/28/2013 02:38   Final   Culture     Final   Value:        BLOOD CULTURE RECEIVED NO GROWTH TO DATE CULTURE WILL BE  HELD FOR 5 DAYS BEFORE ISSUING A FINAL NEGATIVE REPORT   Report Status PENDING   Incomplete  CULTURE, BLOOD (ROUTINE X 2)     Status: None   Collection Time    02/27/13  9:35 PM      Result Value Range Status   Specimen Description BLOOD RIGHT ARM   Final   Special Requests BOTTLES DRAWN AEROBIC AND ANAEROBIC 10CC EACH   Final   Culture  Setup Time 02/28/2013 02:41   Final   Culture     Final   Value:        BLOOD CULTURE RECEIVED NO GROWTH TO DATE CULTURE WILL BE HELD FOR 5 DAYS BEFORE ISSUING A FINAL NEGATIVE REPORT   Report Status PENDING   Incomplete  URINE CULTURE     Status: None  Collection Time    02/28/13  4:51 PM      Result Value Range Status   Specimen Description URINE, CLEAN CATCH   Final   Special Requests NONE   Final   Culture  Setup Time 03/01/2013 03:29   Final   Colony Count NO GROWTH   Final   Culture NO GROWTH   Final   Report Status 03/02/2013 FINAL   Final  CULTURE, ROUTINE-ABSCESS     Status: None   Collection Time    03/01/13  2:32 PM      Result Value Range Status   Specimen Description ABSCESS ABDOMEN   Final   Special Requests ABDOMEN MASS   Final   Gram Stain     Final   Value: ABUNDANT WBC PRESENT,BOTH PMN AND MONONUCLEAR     NO SQUAMOUS EPITHELIAL CELLS SEEN     NO ORGANISMS SEEN   Culture NO GROWTH 3 DAYS   Final   Report Status 03/04/2013 FINAL   Final     Studies: No results found.  Scheduled Meds: . aspirin  325 mg Oral Daily  . levofloxacin  750 mg Oral Daily  . metoprolol tartrate  25 mg Oral BID  . pneumococcal 23 valent vaccine  0.5 mL Intramuscular Tomorrow-1000  . potassium chloride  20 mEq Oral Once  . simvastatin  20 mg Oral QPM  . sodium chloride  3 mL Intravenous Q12H  . zolpidem  5 mg Oral QHS   Continuous Infusions: . sodium chloride 1 mL/kg/hr (03/05/13 1101)     [name] [name] Pager: [phone]  If 7PM-7AM, please contact night-coverage [email or web address] Password [email or web address] 03/04/2013, 8:11 AM   LOS: 6 days

## 2013-03-05 NOTE — H&P (View-Only) (Signed)
Subjective: Patient denies CP  No SOB Objective: Filed Vitals:   03/03/13 0431 03/03/13 1348 03/03/13 2019 03/04/13 0452  BP: 130/68 136/69 133/66 113/57  Pulse: 88 83 82 74  Temp: 98.5 F (36.9 C) 98.7 F (37.1 C) 99.2 F (37.3 C) 98.6 F (37 C)  TempSrc: Oral Oral Oral Oral  Resp: 18 18 18 18  Height:      Weight:      SpO2: 97% 95% 96% 93%   Weight change:   Intake/Output Summary (Last 24 hours) at 03/04/13 0747 Last data filed at 03/03/13 1817  Gross per 24 hour  Intake    360 ml  Output      0 ml  Net    360 ml    General: Alert, awake, oriented x3, in no acute distress Neck:  JVP is normal Heart: Regular rate and rhythm, without murmurs, rubs, gallops.  Lungs: Clear to auscultation.  No rales or wheezes. Exemities:  No edema.   Neuro: Grossly intact, nonfocal.  Tele:  SR. Lab Results: Results for orders placed during the hospital encounter of 02/27/13 (from the past 24 hour(s))  TROPONIN I     Status: Abnormal   Collection Time    03/03/13 11:43 AM      Result Value Range   Troponin I 2.73 (*) <0.30 ng/mL  PROCALCITONIN     Status: None   Collection Time    03/04/13  5:57 AM      Result Value Range   Procalcitonin 1.12      Studies/Results: @[id number]@  Medications: REviewed   Imp 1.  NSTEMI  I have discussed with patient troponin elevation  Echo shows overall normal LVEF with mild anteroapical hypokinesis The patient will need a L heart cath before any surgery or before D/C  QUestion is timing He remains asymptomatic.  Bx pending Will check labs and hydrate today.    2.  Abdomenal mass  Awating Bx results.  LOS: 5 days   Azaylia Fong 03/04/2013, 7:47 AM   

## 2013-03-05 NOTE — Progress Notes (Signed)
Pt back to room s/p cardiac cath via R radial; dressing D&I; no bleeding noted;R arm elevated on pillow; pt given education regarding R radial site; wife at bedside; no c/o pain; IV fluids to run X8 more hours; pt sitting up eating lunch at this time; will cont. To monitor.

## 2013-03-06 DIAGNOSIS — R1900 Intra-abdominal and pelvic swelling, mass and lump, unspecified site: Secondary | ICD-10-CM

## 2013-03-06 LAB — CBC
MCH: 29.3 pg (ref 26.0–34.0)
Platelets: 304 10*3/uL (ref 150–400)
RBC: 2.7 MIL/uL — ABNORMAL LOW (ref 4.22–5.81)
RDW: 13.2 % (ref 11.5–15.5)
WBC: 7.9 10*3/uL (ref 4.0–10.5)

## 2013-03-06 LAB — BASIC METABOLIC PANEL
CO2: 25 mEq/L (ref 19–32)
Calcium: 8.5 mg/dL (ref 8.4–10.5)
Chloride: 112 mEq/L (ref 96–112)
GFR calc Af Amer: 74 mL/min — ABNORMAL LOW (ref >90)
Sodium: 146 mEq/L — ABNORMAL HIGH (ref 135–145)

## 2013-03-06 LAB — CULTURE, BLOOD (ROUTINE X 2): Culture: NO GROWTH

## 2013-03-06 MED ORDER — METRONIDAZOLE 500 MG PO TABS: 500.0000 mg | ORAL_TABLET | Freq: Three times a day (TID) | ORAL | Status: DC

## 2013-03-06 MED ORDER — CIPROFLOXACIN HCL 500 MG PO TABS: 500.0000 mg | ORAL_TABLET | Freq: Two times a day (BID) | ORAL | Status: DC

## 2013-03-06 MED ORDER — FERROUS SULFATE 325 (65 FE) MG PO TABS: 325.0000 mg | ORAL_TABLET | Freq: Every day | ORAL | Status: DC

## 2013-03-06 MED ORDER — METOPROLOL TARTRATE 25 MG PO TABS: 25.0000 mg | ORAL_TABLET | Freq: Two times a day (BID) | ORAL | Status: DC

## 2013-03-06 NOTE — Progress Notes (Signed)
Pt and wife educated and informed of DC information. IV's removed, pt verbalizes understanding of medications and f/u appts. Pt has no further questions or concerns. Ready for DC with wife.

## 2013-03-06 NOTE — Progress Notes (Signed)
Pt refused pneumonia vaccine, states he wants to receive it from his PCP.

## 2013-03-06 NOTE — Progress Notes (Signed)
[facility] PROGRESS NOTE  [name] [name] NWG:[id number] DOB: Aug 14, 1929 DOA: 02/27/2013 PCP: Lenora Boys, MD  Brief narrative: 79 y.o. male who presented to [facility] ED 02/27/13 with complaints of feeling weak and dizzy. Patient reported no loss of consciousness, no chest pain, no palpitations. He did report having fevers and chills started 1 day prior to this admission. In ED, patient was hypotensive with BP 79/29. Further evaluation included CXR which showed left retrocardiac opacity. 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 Biopsy performed, awaiting results. Urinalysis was positive for trace leukocyte esterases. In addition, his BMET revealed creatinine of 1.49 (there was no previous value for comparison). Cardiac enzymes were elevated initially at 0.98 and trended up 6.16. His CBC revealed hemoglobin of 8.3.   Assessment/Plan:   NSTEMI (non-ST elevated myocardial infarction)  Continue aspirin and simvastatin  Appreciate cardiology following  Echo: EF 55%. Mild apical anterior hypokinesis, grade 1 diastolic dysfunction. Cath 3/18, LAD lesion, but no PCI due to abdominal mass / hematoma  Near syncope  Likely due to #1 Cardiac enzymes 0.98 -->2.3 --> 5.27 --> 6.16 -->2.73. Appreciate cardiology following. 2 D ECHO on this admission with EF 55%. S/p cath  Abdominal mass, RLQ (right lower quadrant)  Follow up biopsy of RLQ abdominal mass; appreciate radiology consultation. 60 cc of bloody fluid aspirated. Pathology is inconclusive Discussed with pathology, Dr. Raynald Blend on 3/17: indicated that the specimen was non diagnostic- ? Inadequate sample & recommended re biopsy- discussed with IR Discussed with Dr. Lowella Dandy, IR who indicated that previous biopsy only yielded bloody fluid suggestive of hematoma. He recommends repeat CT Abdomen in a couple  of weeks to follow up of possible hematoma. Also follow up cytology results. Patient will need close follow up for this as an outpatient for repeat imaging in 2 weeks and further decisions at that time. He is being followed by [facility] primary care.    Patient had fevers (103 at home)/chills/RLQ pain for few days before admission. He was febrile to 100 here and had leukocytosis on presentation. He was treated for UTI and possible PNA. Urinalysis was not impressive and patient had no cough or breathing difficulties, however CXR showed atelectasis or infiltrate. He had the hematoma aspirated after several doses of antibiotics which can sterilize any previous infection. I discussed the case with ID over the phone last night and at this point it is reasonable to believe that the RLQ mass was infected given pain/fever/WBC. Aspirate did show "abundant WBC present" but no organisms. I will treat with po Ciprofloxacin and Metronidazole for 10 days and after that he needs a repeat CT scan to evaluate for recurrence. Will talk to Dr. Verdis Frederickson (PCP) today.   Anemia  Unclear etiology  Hemoglobin 8.9 on admission and repeat value stable at 8.4 (03/01/13) Repeat CBC today. FOBT neg on 02/27/13 Anemia panel suggestive of Iron deficiency & ? B12 deficiency.  Will start iron supplements on discharge  Acute kidney failure  Dehydration from an acute infection, UTI, pneumonia  Creatinine trending down 1.51 --> 1.49 --> 1.42 Resolved   UTI (urinary tract infection)  Urinalysis with small amount of leukocyte esterases.  Urine culture no growth  UA quite unimpressive. D/C Abx for this indication.  CAP (community acquired pneumonia)  Follow up blood culture results - no growth to date  S/p Levaquin  Code Status: Full code  Family Communication:  Discussed with patient and wife at bedside. Disposition Plan: home when stable   Consultants:  Cardiology Radiology for biopsy ID phone only  Procedures:  Biopsy of RLQ  mass done 03/01/13  Antibiotics:  Vanco 02/27/13 --> 03/02/13  Zosyn 02/28/2013 --> 03/02/13  Levaquin 03/02/13 -->03/05/13 Cipro/Flagyl 3/18 >>  HPI/Subjective: - no complaints this morning, he is eager to return home. Walked around the unit last night, denies any chest pain, dyspnea, lightheadedness.   Objective: Filed Vitals:   03/05/13 1059 03/05/13 1330 03/05/13 1959 03/06/13 0443  BP: 152/68 131/55 142/56 134/61  Pulse: 78 69 81 73  Temp: 98.4 F (36.9 C) 98.6 F (37 C) 99 F (37.2 C) 98.2 F (36.8 C)  TempSrc: Oral Oral Oral Oral  Resp: 18 18 18 18   Height:      Weight:      SpO2: 99% 98% 99% 97%    Intake/Output Summary (Last 24 hours) at 03/06/13 0529 Last data filed at 03/06/13 0444  Gross per 24 hour  Intake 1606.6 ml  Output    675 ml  Net  931.6 ml   Exam:  General:  Lying comfortably supine in bed.  Cardiovascular: Regular rate and rhythm, S1/S2, no murmurs, no rubs, no gallops. No pedal edema.  Respiratory: Clear to auscultation bilaterally, no wheezing, no crackles, no rhonchi. No increased work of breathing.  Abdomen: Soft, non tender,  distended, bowel sounds present, no guarding  Extremities: No edema, pulses DP and PT palpable bilaterally  Neuro: Grossly non focal. Alert and oriented.  Data Reviewed: Basic Metabolic Panel:  Recent Labs Lab 02/28/13 0212 03/01/13 0512 03/04/13 0806 03/04/13 1413 03/05/13 0531  NA 138 140 144 143 144  K 3.5 3.6 3.0* 3.5 3.4*  CL 102 104 109 108 111  CO2 23 24 26 25 24   GLUCOSE 124* 133* 191* 170* 100*  BUN 24* 23 18 18 18   CREATININE 1.49* 1.42* 0.97 0.98 1.08  CALCIUM 8.0* 8.1* 8.6 8.6 8.5   Liver Function Tests:  Recent Labs Lab 02/27/13 2022 02/28/13 0212  AST 23 28  ALT 20 19  ALKPHOS 70 69  BILITOT 0.5 0.7  PROT 6.7 5.9*  ALBUMIN 3.3* 2.8*    Recent Labs Lab 02/27/13 2022  LIPASE 26   CBC:  Recent Labs Lab 02/27/13 2022 02/28/13 0212 03/01/13 0512 03/04/13 0806  03/05/13 0531 03/06/13 0436  WBC 14.0* 8.9 12.1* 5.8 7.4 7.9  NEUTROABS 12.3*  --   --   --   --   --   HGB 8.9* 8.3* 8.4* 8.7* 8.4* 7.9*  HCT 26.5* 24.3* 25.1* 26.0* 25.8* 24.5*  MCV 90.4 91.0 91.3 89.0 90.5 90.7  PLT 237 201 195 270 298 304   Cardiac Enzymes:  Recent Labs Lab 02/27/13 2022 02/28/13 0212 02/28/13 1010 02/28/13 1245 03/03/13 1143  TROPONINI 0.98* 2.30* 5.27* 6.16* 2.73*    Recent Results (from the past 240 hour(s))  CULTURE, BLOOD (ROUTINE X 2)     Status: None   Collection Time    02/27/13  9:30 PM      Result Value Range Status   Specimen Description BLOOD RIGHT HAND   Final   Special Requests BOTTLES DRAWN AEROBIC AND ANAEROBIC 10CC EACH   Final   Culture  Setup Time 02/28/2013 02:38   Final   Culture     Final   Value:        BLOOD CULTURE RECEIVED NO GROWTH TO DATE CULTURE WILL BE HELD FOR 5 DAYS  BEFORE ISSUING A FINAL NEGATIVE REPORT   Report Status PENDING   Incomplete  CULTURE, BLOOD (ROUTINE X 2)     Status: None   Collection Time    02/27/13  9:35 PM      Result Value Range Status   Specimen Description BLOOD RIGHT ARM   Final   Special Requests BOTTLES DRAWN AEROBIC AND ANAEROBIC 10CC EACH   Final   Culture  Setup Time 02/28/2013 02:41   Final   Culture     Final   Value:        BLOOD CULTURE RECEIVED NO GROWTH TO DATE CULTURE WILL BE HELD FOR 5 DAYS BEFORE ISSUING A FINAL NEGATIVE REPORT   Report Status PENDING   Incomplete  URINE CULTURE     Status: None   Collection Time    02/28/13  4:51 PM      Result Value Range Status   Specimen Description URINE, CLEAN CATCH   Final   Special Requests NONE   Final   Culture  Setup Time 03/01/2013 03:29   Final   Colony Count NO GROWTH   Final   Culture NO GROWTH   Final   Report Status 03/02/2013 FINAL   Final  CULTURE, ROUTINE-ABSCESS     Status: None   Collection Time    03/01/13  2:32 PM      Result Value Range Status   Specimen Description ABSCESS ABDOMEN   Final   Special Requests  ABDOMEN MASS   Final   Gram Stain     Final   Value: ABUNDANT WBC PRESENT,BOTH PMN AND MONONUCLEAR     NO SQUAMOUS EPITHELIAL CELLS SEEN     NO ORGANISMS SEEN   Culture NO GROWTH 3 DAYS   Final   Report Status 03/04/2013 FINAL   Final     Studies: No results found.  Scheduled Meds: . aspirin  325 mg Oral Daily  . ciprofloxacin  500 mg Oral BID  . metoprolol tartrate  25 mg Oral BID  . metroNIDAZOLE  500 mg Oral Q8H  . pneumococcal 23 valent vaccine  0.5 mL Intramuscular Tomorrow-1000  . simvastatin  20 mg Oral QPM  . sodium chloride  3 mL Intravenous Q12H  . zolpidem  5 mg Oral QHS   Continuous Infusions:     GHERGHE, COSTIN Pager: [phone]  If 7PM-7AM, please contact night-coverage [email or web address] Password [email or web address] 03/04/2013, 8:11 AM   LOS: 7 days

## 2013-03-06 NOTE — Discharge Summary (Addendum)
Physician Discharge Summary  [name] [name] WUJ:[id number] DOB: 1929/10/27 DOA: 02/27/2013  PCP: Lenora Boys, MD  Admit date: 02/27/2013 Discharge date: 03/06/2013  Time spent: 40 minutes  Recommendations for Outpatient Follow-up:  1. Please follow up with your PCP in 10 days for repeat imaging 2. Please follow up with Cardiology for re-evaluation for stent placement  Discharge Diagnoses:  Principal Problem:   Near syncope Active Problems:   NSTEMI (non-ST elevated myocardial infarction)   Abdominal mass, RLQ (right lower quadrant)   Anemia   Acute kidney failure   UTI (urinary tract infection)   CAP (community acquired pneumonia)   Hypotension  Discharge Condition: stable  Diet recommendation: heart healthy, low salt  Filed Weights   02/28/13 0005 02/28/13 0206  Weight: 81.647 kg (180 lb) 80.604 kg (177 lb 11.2 oz)    History of present illness:  79 y.o. male who presented to [facility] ED 02/27/13 with complaints of feeling weak and dizzy. Patient reported no loss of consciousness, no chest pain, no palpitations. He did report having fevers and chills started 1 day prior to this admission. In ED, patient was hypotensive with BP 79/29. Further evaluation included CXR which showed left retrocardiac opacity. 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 Biopsy performed, awaiting results. Urinalysis was positive for trace leukocyte esterases. In addition, his BMET revealed creatinine of 1.49 (there was no previous value for comparison). Cardiac enzymes were elevated initially at 0.98 and trended up 6.16. His CBC revealed hemoglobin of 8.3.    Hospital Course:  NSTEMI (non-ST elevated myocardial infarction)  Cardiology has been consulted while patient in the hospital. 2D echo showed EF 55% with mild apical anterior hypokinesis, grade 1  diastolic dysfunction (full report below).  Cath 3/18, LAD lesion, but no PCI due to abdominal mass / hematoma. Will need close follow up with cardiology in ~2 weeks.  Near syncope  Likely due to #1  Cardiac enzymes 0.98 -->2.3 --> 5.27 --> 6.16 -->2.73.  Abdominal mass, RLQ (right lower quadrant)  RLQ abdominal mass drained by IR 60 cc of bloody fluid aspirated. Pathology is inconclusive. Discussed with pathology, Dr. Raynald Blend on 3/17: indicated that the specimen was non diagnostic- ? Inadequate sample & recommended re biopsy- discussed with IR  Discussed with Dr. Lowella Dandy, IR who indicated that previous biopsy only yielded bloody fluid suggestive of hematoma. He recommends repeat CT Abdomen in a couple of weeks to follow up of possible hematoma.  Patient will need close follow up for this as an outpatient for repeat imaging in 2 weeks and further decisions at that time. He is being followed by [facility] primary care.  Patient had fevers (103 at home)/chills/RLQ pain for few days before admission. He was febrile to 100 here and had leukocytosis on presentation. He was treated for UTI and possible PNA. Urinalysis was not impressive and patient had no cough or breathing difficulties, however CXR showed atelectasis or infiltrate. He had the hematoma aspirated after several doses of antibiotics which can sterilize any previous infection. I discussed the case with ID over the phone 03/05/13 and at this point it is reasonable to believe that the RLQ mass was infected given pain/fever/WBC. Aspirate did show "abundant WBC present" but no organisms. I will treat with po Ciprofloxacin and Metronidazole for 10 days and after that he needs a repeat CT scan to evaluate for recurrence. I have  personally communicated my impressions and discussed the case with Dr. Verdis Frederickson.  Anemia  Unclear etiology, anemia panel suggestive of Iron deficiency.  Will start iron supplements on discharge Acute kidney failure  Dehydration from an  acute infection, UTI, pneumonia  Creatinine trending down to 1.06 on discharge Resolved. Patient had a contrasted CT and a cardiac cath while hospitalized and acute kidney injury.  UTI (urinary tract infection)  Urinalysis with small amount of leukocyte esterases.  Urine culture no growth  UA quite unimpressive. D/C Abx for this indication. CAP (community acquired pneumonia)  Follow up blood culture results - no growth to date  S/p Levaquin  Procedures:  2D echo Study Conclusions  - Left ventricle: The cavity size was normal. Wall thickness was normal. The estimated ejection fraction was 55%. Possible mild apical anterior hypokinesis. Doppler parameters are consistent with abnormal left ventricular relaxation (grade 1 diastolic dysfunction). - Aortic valve: There was no stenosis. - Mitral valve: Trivial regurgitation. - Left atrium: The atrium was mildly dilated. - Right ventricle: The cavity size was normal. Systolic function was normal. - Right atrium: The atrium was mildly dilated. - Tricuspid valve: Peak RV-RA gradient:63mm Hg (S). - Pulmonary arteries: PA peak pressure: 45mm Hg (S). - Inferior vena cava: The vessel was normal in size; the respirophasic diameter changes were in the normal range (= 50%); findings are consistent with normal central venous pressure.  Cardiac cath 3/18.  1. Single vessel obstructive coronary disease.  2. Well-preserved LV function with anterior wall motion abnormality.  Recommendations: The LAD supplies a large territory. There is clearly viable myocardium in this distribution. There are good collaterals from the RCA. The LAD has the appearance of a recent occlusion. The LAD appears suitable for PCI. Given the uncertainty regarding his abdominal mass I would recommend medical therapy initially with consideration of PCI in the near future once the issue of his abdominal mass has been resolved and the patient can safely be anticoagulated and  placed on dual antiplatelet therapy.  Consultations:  Cardiology  IR  Discharge Exam: Filed Vitals:   03/05/13 1330 03/05/13 1959 03/06/13 0443 03/06/13 1048  BP: 131/55 142/56 134/61 136/60  Pulse: 69 81 73 71  Temp: 98.6 F (37 C) 99 F (37.2 C) 98.2 F (36.8 C)   TempSrc: Oral Oral Oral   Resp: 18 18 18    Height:      Weight:      SpO2: 98% 99% 97%    General: NAD Cardiovascular: RRR without MRG Respiratory: CTA biL  Discharge Instructions   Future Appointments Provider Department Dept Phone   03/20/2013 9:30 AM Rosalio Macadamia, NP [facility] Main Office [place]) [phone]       Medication List    STOP taking these medications       carvedilol 25 MG tablet  Commonly known as:  COREG     hydrochlorothiazide 25 MG tablet  Commonly known as:  HYDRODIURIL      TAKE these medications       amLODipine-benazepril 10-20 MG per capsule  Commonly known as:  LOTREL  Take 1 capsule by mouth daily.     aspirin 81 MG chewable tablet  Chew 81 mg by mouth daily.     ciprofloxacin 500 MG tablet  Commonly known as:  CIPRO  Take 1 tablet (500 mg total) by mouth 2 (two) times daily.     ferrous sulfate 325 (65 FE) MG tablet  Commonly known as:  FERROUSUL  Take 1 tablet (  325 mg total) by mouth daily with breakfast.     metoprolol tartrate 25 MG tablet  Commonly known as:  LOPRESSOR  Take 1 tablet (25 mg total) by mouth 2 (two) times daily.     metroNIDAZOLE 500 MG tablet  Commonly known as:  FLAGYL  Take 1 tablet (500 mg total) by mouth every 8 (eight) hours.     simvastatin 20 MG tablet  Commonly known as:  ZOCOR  Take 20 mg by mouth every evening.           Follow-up Information   Follow up with FRIED, ROBERT L, MD In 10 days.   Contact information:   [address] [place] [address] [phone]       The results of significant diagnostics from this hospitalization (including imaging, microbiology, ancillary and laboratory) are listed below for  reference.    Significant Diagnostic Studies: Ct Abdomen Pelvis W Contrast  02/27/2013  *RADIOLOGY REPORT*  Clinical Data: Fever, right lower quadrant pain.  CT ABDOMEN AND PELVIS WITH CONTRAST  Technique:  Multidetector CT imaging of the abdomen and pelvis was performed following the standard protocol during bolus administration of intravenous contrast.  Contrast: OMNIPAQUE IOHEXOL 300 MG/ML  SOLN  Comparison: None.  Findings: Minimal scarring or atelectasis within the lower lungs. Heart size upper normal to mildly enlarged.  Coronary artery calcification.  Unremarkable liver, biliary system, spleen, pancreas, adrenal glands.  Symmetric renal enhancement.  No hydronephrosis or hydroureter.  Colonic diverticulosis.  No CT evidence for colitis or diverticulitis.  Normal appendix.  Small hiatal hernia.  6.2 x 5.7 cm heterogeneous attenuation mass within the right anterior lower abdomen, intimately associated with a proximal ileal loop of small bowel.  There is mild adjacent stranding of the mesenteric fat and thickening of the anterior peritoneum as seen on images 67 - 69 as index.  No free intraperitoneal air or fluid.  No lymphadenopathy.  There is scattered atherosclerotic calcification of the aorta and its branches. No aneurysmal dilatation.  Partially decompressed bladder.  Fat containing left inguinal hernia.  Multilevel degenerative changes of the imaged spine. No acute or aggressive appearing osseous lesion.  IMPRESSION: 6.2 x 5.7 cm heterogeneous attenuation mass within the right lower abdomen, intimately associated with a loop of proximal ileum. Internal areas of low attenuation favored to reflect necrosis. Favored differential includes a small bowel stromal tumor, metastatic disease, or lymphoma. Recommend tissue sampling.  Adjacent mesenteric fat stranding and peritoneal thickening may reflect local spread or superimposed infection.   Original Report Authenticated By: Jearld Lesch, M.D.     Ct Biopsy  03/01/2013  *RADIOLOGY REPORT*  Clinical history:79 year old with a right lower quadrant abdominal lesion.  PROCEDURE(S): CT GUIDED BIOPSY/ASPIRATION OF THE RIGHT LOWER QUADRANT LESION  Physician: Rachelle Hora. Henn, MD  Medications:Versed 2 mg, Fentanyl 75 mcg. A radiology nurse monitored the patient for moderate sedation.  Moderate sedation time:35 minutes  Fluoroscopy time: 15 seconds of CT fluoroscopy  Procedure:The procedure was explained to the patient.  The risks and benefits of the procedure were discussed and the patient's questions were addressed.  Informed consent was obtained from the patient.  The patient was placed supine.  CT images of the lower abdomen were obtained.  Percutaneous window was identified along the anterior lower abdomen.  The skin was prepped and draped in a sterile fashion.  Skin was anesthetized with 1% lidocaine.  17 gauge needle was directed into this hyperdense lesion with CT fluoroscopy.  Bloody fluid was sent  immediately drained from the needle once the stylet was removed.  20 ml of bloody fluid was initially removed.  A core biopsy was performed with an 18 gauge device but minimal tissue was obtained.  Subsequently, additional bloody fluid was aspirated.  A total of 60 ml of bloody fluid was removed.  17 gauge needle was removed without complication.  Findings:Large hyperdense lesion in the anterior right lower abdomen measuring up to 6.6 cm. Needle was directed into the lesion with CT fluoroscopy.  Bloody fluid was immediately drained from the needle.  60 ml of bloody fluid was removed.  Small amount of gas was within the lesion following removal of the needle. This finding suggests that the lesion may be a multiloculated collection.  The lesion was slightly smaller in size after aspiration.  Complications: None  Impression:CT guided aspiration and biopsy of the abdominal lesion in the right lower abdomen.  A large amount of bloody fluid was aspirated from this  lesion.  The lesion is hyperdense and heterogeneous.  This could represent a complex hematoma.  Samples were sent for pathology, cytology and culture.   Original Report Authenticated By: Richarda Overlie, M.D.    Dg Chest Port 1 View  02/27/2013  *RADIOLOGY REPORT*  Clinical Data: Fever, fall  PORTABLE CHEST - 1 VIEW  Comparison: Prior chest x-ray 11/09/2011  Findings: Left retrocardiac opacity.  Given portable frontal technique, cardiac and mediastinal contours remain within normal limits.  Aortic atherosclerosis noted.  No effusion, or pneumothorax.  No edema.  No acute osseous abnormality.  IMPRESSION: Left retrocardiac opacity may reflect atelectasis and / or infiltrate.   Original Report Authenticated By: Malachy Moan, M.D.     Microbiology: Recent Results (from the past 240 hour(s))  CULTURE, BLOOD (ROUTINE X 2)     Status: None   Collection Time    02/27/13  9:30 PM      Result Value Range Status   Specimen Description BLOOD RIGHT HAND   Final   Special Requests BOTTLES DRAWN AEROBIC AND ANAEROBIC 10CC EACH   Final   Culture  Setup Time 02/28/2013 02:38   Final   Culture NO GROWTH 5 DAYS   Final   Report Status 03/06/2013 FINAL   Final  CULTURE, BLOOD (ROUTINE X 2)     Status: None   Collection Time    02/27/13  9:35 PM      Result Value Range Status   Specimen Description BLOOD RIGHT ARM   Final   Special Requests BOTTLES DRAWN AEROBIC AND ANAEROBIC 10CC EACH   Final   Culture  Setup Time 02/28/2013 02:41   Final   Culture NO GROWTH 5 DAYS   Final   Report Status 03/06/2013 FINAL   Final  URINE CULTURE     Status: None   Collection Time    02/28/13  4:51 PM      Result Value Range Status   Specimen Description URINE, CLEAN CATCH   Final   Special Requests NONE   Final   Culture  Setup Time 03/01/2013 03:29   Final   Colony Count NO GROWTH   Final   Culture NO GROWTH   Final   Report Status 03/02/2013 FINAL   Final  CULTURE, ROUTINE-ABSCESS     Status: None   Collection Time     03/01/13  2:32 PM      Result Value Range Status   Specimen Description ABSCESS ABDOMEN   Final   Special Requests ABDOMEN MASS  Final   Gram Stain     Final   Value: ABUNDANT WBC PRESENT,BOTH PMN AND MONONUCLEAR     NO SQUAMOUS EPITHELIAL CELLS SEEN     NO ORGANISMS SEEN   Culture NO GROWTH 3 DAYS   Final   Report Status 03/04/2013 FINAL   Final     Labs: Basic Metabolic Panel:  Recent Labs Lab 03/01/13 0512 03/04/13 0806 03/04/13 1413 03/05/13 0531 03/06/13 0436  NA 140 144 143 144 146*  K 3.6 3.0* 3.5 3.4* 3.8  CL 104 109 108 111 112  CO2 24 26 25 24 25   GLUCOSE 133* 191* 170* 100* 100*  BUN 23 18 18 18 18   CREATININE 1.42* 0.97 0.98 1.08 1.06  CALCIUM 8.1* 8.6 8.6 8.5 8.5   Liver Function Tests:  Recent Labs Lab 02/27/13 2022 02/28/13 0212  AST 23 28  ALT 20 19  ALKPHOS 70 69  BILITOT 0.5 0.7  PROT 6.7 5.9*  ALBUMIN 3.3* 2.8*    Recent Labs Lab 02/27/13 2022  LIPASE 26   CBC:  Recent Labs Lab 02/27/13 2022 02/28/13 0212 03/01/13 0512 03/04/13 0806 03/05/13 0531 03/06/13 0436  WBC 14.0* 8.9 12.1* 5.8 7.4 7.9  NEUTROABS 12.3*  --   --   --   --   --   HGB 8.9* 8.3* 8.4* 8.7* 8.4* 7.9*  HCT 26.5* 24.3* 25.1* 26.0* 25.8* 24.5*  MCV 90.4 91.0 91.3 89.0 90.5 90.7  PLT 237 201 195 270 298 304   Cardiac Enzymes:  Recent Labs Lab 02/27/13 2022 02/28/13 0212 02/28/13 1010 02/28/13 1245 03/03/13 1143  TROPONINI 0.98* 2.30* 5.27* 6.16* 2.73*    Signed:  Guilford Shannahan  [facility] 03/06/2013, 11:40 AM

## 2013-03-06 NOTE — Progress Notes (Signed)
TELEMETRY: Reviewed telemetry pt in NSR rate 70: Filed Vitals:   03/05/13 1059 03/05/13 1330 03/05/13 1959 03/06/13 0443  BP: 152/68 131/55 142/56 134/61  Pulse: 78 69 81 73  Temp: 98.4 F (36.9 C) 98.6 F (37 C) 99 F (37.2 C) 98.2 F (36.8 C)  TempSrc: Oral Oral Oral Oral  Resp: 18 18 18 18   Height:      Weight:      SpO2: 99% 98% 99% 97%    Intake/Output Summary (Last 24 hours) at 03/06/13 0849 Last data filed at 03/06/13 0444  Gross per 24 hour  Intake 1364.8 ml  Output    675 ml  Net  689.8 ml    SUBJECTIVE No chest pain or SOB. Feels well. No fever.  LABS: Basic Metabolic Panel:  Recent Labs  40/98/11 0531 03/06/13 0436  NA 144 146*  K 3.4* 3.8  CL 111 112  CO2 24 25  GLUCOSE 100* 100*  BUN 18 18  CREATININE 1.08 1.06  CALCIUM 8.5 8.5   CBC:  Recent Labs  03/05/13 0531 03/06/13 0436  WBC 7.4 7.9  HGB 8.4* 7.9*  HCT 25.8* 24.5*  MCV 90.5 90.7  PLT 298 304   Cardiac Enzymes:  Recent Labs  03/03/13 1143  TROPONINI 2.73*   Radiology/Studies:  Ct Abdomen Pelvis W Contrast  02/27/2013  *RADIOLOGY REPORT*  Clinical Data: Fever, right lower quadrant pain.  CT ABDOMEN AND PELVIS WITH CONTRAST  Technique:  Multidetector CT imaging of the abdomen and pelvis was performed following the standard protocol during bolus administration of intravenous contrast.  Contrast: OMNIPAQUE IOHEXOL 300 MG/ML  SOLN  Comparison: None.  Findings: Minimal scarring or atelectasis within the lower lungs. Heart size upper normal to mildly enlarged.  Coronary artery calcification.  Unremarkable liver, biliary system, spleen, pancreas, adrenal glands.  Symmetric renal enhancement.  No hydronephrosis or hydroureter.  Colonic diverticulosis.  No CT evidence for colitis or diverticulitis.  Normal appendix.  Small hiatal hernia.  6.2 x 5.7 cm heterogeneous attenuation mass within the right anterior lower abdomen, intimately associated with a proximal ileal loop of small  bowel.  There is mild adjacent stranding of the mesenteric fat and thickening of the anterior peritoneum as seen on images 67 - 69 as index.  No free intraperitoneal air or fluid.  No lymphadenopathy.  There is scattered atherosclerotic calcification of the aorta and its branches. No aneurysmal dilatation.  Partially decompressed bladder.  Fat containing left inguinal hernia.  Multilevel degenerative changes of the imaged spine. No acute or aggressive appearing osseous lesion.  IMPRESSION: 6.2 x 5.7 cm heterogeneous attenuation mass within the right lower abdomen, intimately associated with a loop of proximal ileum. Internal areas of low attenuation favored to reflect necrosis. Favored differential includes a small bowel stromal tumor, metastatic disease, or lymphoma. Recommend tissue sampling.  Adjacent mesenteric fat stranding and peritoneal thickening may reflect local spread or superimposed infection.   Original Report Authenticated By: Jearld Lesch, M.D.    Ct Biopsy  03/01/2013  *RADIOLOGY REPORT*  Clinical history:79 year old with a right lower quadrant abdominal lesion.  PROCEDURE(S): CT GUIDED BIOPSY/ASPIRATION OF THE RIGHT LOWER QUADRANT LESION  Physician: Rachelle Hora. Henn, MD  Medications:Versed 2 mg, Fentanyl 75 mcg. A radiology nurse monitored the patient for moderate sedation.  Moderate sedation time:35 minutes  Fluoroscopy time: 15 seconds of CT fluoroscopy  Procedure:The procedure was explained to the patient.  The risks and benefits of the procedure were discussed and the patient's  questions were addressed.  Informed consent was obtained from the patient.  The patient was placed supine.  CT images of the lower abdomen were obtained.  Percutaneous window was identified along the anterior lower abdomen.  The skin was prepped and draped in a sterile fashion.  Skin was anesthetized with 1% lidocaine.  17 gauge needle was directed into this hyperdense lesion with CT fluoroscopy.  Bloody fluid was sent  immediately drained from the needle once the stylet was removed.  20 ml of bloody fluid was initially removed.  A core biopsy was performed with an 18 gauge device but minimal tissue was obtained.  Subsequently, additional bloody fluid was aspirated.  A total of 60 ml of bloody fluid was removed.  17 gauge needle was removed without complication.  Findings:Large hyperdense lesion in the anterior right lower abdomen measuring up to 6.6 cm. Needle was directed into the lesion with CT fluoroscopy.  Bloody fluid was immediately drained from the needle.  60 ml of bloody fluid was removed.  Small amount of gas was within the lesion following removal of the needle. This finding suggests that the lesion may be a multiloculated collection.  The lesion was slightly smaller in size after aspiration.  Complications: None  Impression:CT guided aspiration and biopsy of the abdominal lesion in the right lower abdomen.  A large amount of bloody fluid was aspirated from this lesion.  The lesion is hyperdense and heterogeneous.  This could represent a complex hematoma.  Samples were sent for pathology, cytology and culture.   Original Report Authenticated By: Richarda Overlie, M.D.    Dg Chest Port 1 View  02/27/2013  *RADIOLOGY REPORT*  Clinical Data: Fever, fall  PORTABLE CHEST - 1 VIEW  Comparison: Prior chest x-ray 11/09/2011  Findings: Left retrocardiac opacity.  Given portable frontal technique, cardiac and mediastinal contours remain within normal limits.  Aortic atherosclerosis noted.  No effusion, or pneumothorax.  No edema.  No acute osseous abnormality.  IMPRESSION: Left retrocardiac opacity may reflect atelectasis and / or infiltrate.   Original Report Authenticated By: Malachy Moan, M.D.     PHYSICAL EXAM General: Well developed, well nourished, in no acute distress. Head: Normocephalic, atraumatic, sclera non-icteric, no xanthomas, nares are without discharge. Neck: Negative for carotid bruits. JVD not  elevated. Lungs: Clear bilaterally to auscultation without wheezes, rales, or rhonchi. Breathing is unlabored. Heart: RRR S1 S2 without murmurs, rubs, or gallops.  Abdomen: Soft, non-tender, non-distended with normoactive bowel sounds. No hepatomegaly. No rebound/guarding. No obvious abdominal masses. Msk:  Strength and tone appears normal for age. Extremities: No clubbing, cyanosis or edema.  Distal pedal pulses are 2+ and equal bilaterally. No radial hematoma. Neuro: Alert and oriented X 3. Moves all extremities spontaneously. Psych:  Responds to questions appropriately with a normal affect.  ASSESSMENT AND PLAN: 1. NSTEMI due to recent LAD occlusion. Infarct limited by collaterals from RCA. LAD supplies a large territory and there is clearly viable myocardium in this area. Currently asymptomatic on medication. I think he will need PCI of the LAD as soon as issue with abdominal mass is resolved.  Continue ASA, metoprolol, and statin for now. OK for discharge and we will follow up in 2 weeks after repeat abdominal CT to discuss timing of PCI. 2. Abdominal mass. Cytology shows atypical cells with inflammatory cells. This suggest possible abscess. Patient may have hemorrhaged into mass. Plan antibiotics for 10 days then repeat CT. If mass resolves could safely proceed with PCI. 3. Anemia- replete iron.  4. 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   Principal Problem:   Near syncope Active Problems:   NSTEMI (non-ST elevated myocardial infarction)   Abdominal mass, RLQ (right lower quadrant)   Anemia   Acute kidney failure   UTI (urinary tract infection)   CAP (community acquired pneumonia)   Hypotension    Signed, Peter Swaziland MD,FACC 03/06/2013 8:56 AM

## 2013-03-11 DIAGNOSIS — G4701 Insomnia due to medical condition: Secondary | ICD-10-CM | POA: Diagnosis not present

## 2013-03-11 DIAGNOSIS — N179 Acute kidney failure, unspecified: Secondary | ICD-10-CM | POA: Diagnosis not present

## 2013-03-11 DIAGNOSIS — I10 Essential (primary) hypertension: Secondary | ICD-10-CM | POA: Diagnosis not present

## 2013-03-11 DIAGNOSIS — D649 Anemia, unspecified: Secondary | ICD-10-CM | POA: Diagnosis not present

## 2013-03-11 DIAGNOSIS — I2510 Atherosclerotic heart disease of native coronary artery without angina pectoris: Secondary | ICD-10-CM | POA: Diagnosis not present

## 2013-03-11 DIAGNOSIS — S3690XA Unspecified injury of unspecified intra-abdominal organ, initial encounter: Secondary | ICD-10-CM | POA: Diagnosis not present

## 2013-03-11 DIAGNOSIS — D539 Nutritional anemia, unspecified: Secondary | ICD-10-CM | POA: Diagnosis not present

## 2013-03-19 ENCOUNTER — Other Ambulatory Visit: Payer: Self-pay | Admitting: Family Medicine

## 2013-03-19 DIAGNOSIS — S3690XA Unspecified injury of unspecified intra-abdominal organ, initial encounter: Secondary | ICD-10-CM

## 2013-03-20 ENCOUNTER — Encounter: Payer: Self-pay | Admitting: Nurse Practitioner

## 2013-03-20 ENCOUNTER — Ambulatory Visit (INDEPENDENT_AMBULATORY_CARE_PROVIDER_SITE_OTHER): Payer: Medicare Other | Admitting: Nurse Practitioner

## 2013-03-20 VITALS — BP 130/58 | HR 84 | Ht 67.0 in | Wt 174.4 lb

## 2013-03-20 DIAGNOSIS — I214 Non-ST elevation (NSTEMI) myocardial infarction: Secondary | ICD-10-CM | POA: Diagnosis not present

## 2013-03-20 NOTE — Patient Instructions (Addendum)
Stay on your current medicines  I will see you in 3 weeks - hope to schedule your angioplasty at that visit  Please ask Dr. Foy Guadalajara to send [place] a copy of your CT scan and your labs  Call the [facility] office at [phone] if you have any questions, problems or concerns.

## 2013-03-20 NOTE — Progress Notes (Signed)
Randel Pigg Date of Birth: 08/29/1929 Medical Record #[id number]  History of Present Illness: Mr. Woessner is seen back today for a post hospital visit. He is seen for Dr. Swaziland. He presented with a near syncopal spell and NSTEMI almost 3 weeks ago. Also found to have a large abdominal mass measuring 6 cm. Pathology non diagnostic. Possible complex hematoma. Cardiac cath was performed - those results are as below. Has an occluded LAD with excellent right to left collaterals. EF was 50%. The LAD appeared suitable for PCI but given the possible hematoma, medical therapy was recommended initially due to the need for DAPT.   His other issues include HLD, HTN and anemia.   He comes in today. He is here with his wife. He is doing much better. Gaining energy. No chest pain or shortness of breath (but never had). No more dizzy spells or severe weakness. Belly is feeling much better. Has finished his Cipro and Flagyl. For repeat CT this Friday per Dr. Foy Guadalajara. Tolerating his medicines.   Current Outpatient Prescriptions on File Prior to Visit  Medication Sig Dispense Refill  . amLODipine-benazepril (LOTREL) 10-20 MG per capsule Take 1 capsule by mouth daily.      [name] aspirin 81 MG chewable tablet Chew 81 mg by mouth daily.      . ferrous sulfate (FERROUSUL) 325 (65 FE) MG tablet Take 1 tablet (325 mg total) by mouth daily with breakfast.  30 tablet  3  . metoprolol tartrate (LOPRESSOR) 25 MG tablet Take 1 tablet (25 mg total) by mouth 2 (two) times daily.  60 tablet  2  . simvastatin (ZOCOR) 20 MG tablet Take 20 mg by mouth every evening.       No current facility-administered medications on file prior to visit.    No Known Allergies  Past Medical History  Diagnosis Date  . Hypertension   . Hyperlipidemia   . NSTEMI (non-ST elevated myocardial infarction) 02/2013  . Abdominal mass 02/2013    History reviewed. No pertinent past surgical history.  History  Smoking status  . Never Smoker   Smokeless  tobacco  . Never Used    History  Alcohol Use No    History reviewed. No pertinent family history.  Review of Systems: The review of systems is per the HPI.  All other systems were reviewed and are negative.  Physical Exam: BP 130/58  Pulse 84  Ht 5\' 7"  (1.702 m)  Wt 174 lb 6.4 oz (79.107 kg)  BMI 27.31 kg/m2 Patient is very pleasant and in no acute distress. Skin is warm and dry. Color is normal.  HEENT is unremarkable. Normocephalic/atraumatic. PERRL. Sclera are nonicteric. Neck is supple. No masses. No JVD. Lungs are clear. Cardiac exam shows a regular rate and rhythm. Abdomen is soft. Extremities are without edema. Gait and ROM are intact. No gross neurologic deficits noted.  LABORATORY DATA:  Coronary angiography:   Left mainstem: The left main coronary is short with 30% stenosis.  Left anterior descending (LAD): The LAD is a large vessel that is occluded after the takeoff of a large first diagonal branch. There are excellent right to left collaterals. The first diagonal is without significant disease.  Left circumflex (LCx): The left circumflex coronary gives rise to 2 marginal branches before terminating in the AV groove. It has mild irregularities less than 20% proximally.  Right coronary artery (RCA): The right coronary is a large dominant vessel. It has 30-40% disease in the mid vessel. It gives excellent  collaterals through septal perforators to the LAD.  Left ventriculography: Left ventricular systolic function is overall well preserved. Ejection fraction is estimated at 50%. There is mild to moderate hypokinesis of the anterior wall. There is no significant mitral insufficiency.   Final Conclusions:  1. Single vessel obstructive coronary disease.  2. Well-preserved LV function with anterior wall motion abnormality.   Recommendations: The LAD supplies a large territory. There is clearly viable myocardium in this distribution. There are good collaterals from the RCA. The  LAD has the appearance of a recent occlusion. The LAD appears suitable for PCI. Given the uncertainty regarding his abdominal mass I would recommend medical therapy initially with consideration of PCI in the near future once the issue of his abdominal mass has been resolved and the patient can safely be anticoagulated and placed on dual antiplatelet therapy.  [name] [facility]  03/05/2013, 8:49 AM   Lab Results  Component Value Date   WBC 7.9 03/06/2013   HGB 7.9* 03/06/2013   HCT 24.5* 03/06/2013   PLT 304 03/06/2013   GLUCOSE 100* 03/06/2013   ALT 19 02/28/2013   AST 28 02/28/2013   NA 146* 03/06/2013   K 3.8 03/06/2013   CL 112 03/06/2013   CREATININE 1.06 03/06/2013   BUN 18 03/06/2013   CO2 25 03/06/2013   INR 1.17 03/01/2013    Echo Study Conclusions  - Left ventricle: The cavity size was normal. Wall thickness was normal. The estimated ejection fraction was 55%. Possible mild apical anterior hypokinesis. Doppler parameters are consistent with abnormal left ventricular relaxation (grade 1 diastolic dysfunction). - Aortic valve: There was no stenosis. - Mitral valve: Trivial regurgitation. - Left atrium: The atrium was mildly dilated. - Right ventricle: The cavity size was normal. Systolic function was normal. - Right atrium: The atrium was mildly dilated. - Tricuspid valve: Peak RV-RA gradient:43mm Hg (S). - Pulmonary arteries: PA peak pressure: 45mm Hg (S). - Inferior vena cava: The vessel was normal in size; the respirophasic diameter changes were in the normal range (= 50%); findings are consistent with normal central venous pressure.  Assessment / Plan: 1. Recent NSTEMI - with cath results as noted above - doing well clinically. With the uncertain etiology of the abdominal mass will need to hold on PCI until further defined due to the need for DAPT. May need surgery referral if not resolved.  I will see him back in 3 weeks.   2. Abdominal mass - for repeat CT scan later this  week. Has already had labs rechecked per his PCP which were reportedly ok.   3. Anemia - rechecked by Dr. Foy Guadalajara.   4. HLD - on statin  Patient is agreeable to this plan and will call if any problems develop in the interim.   Rosalio Macadamia, RN, ANP-C [facility] [address] [place], [place]  [address]

## 2013-03-22 ENCOUNTER — Ambulatory Visit
Admission: RE | Admit: 2013-03-22 | Discharge: 2013-03-22 | Disposition: A | Discharge status: Home / Self Care | Payer: Medicare Other | Source: Ambulatory Visit | Attending: Family Medicine | Admitting: Family Medicine

## 2013-03-22 DIAGNOSIS — R1903 Right lower quadrant abdominal swelling, mass and lump: Secondary | ICD-10-CM | POA: Diagnosis not present

## 2013-03-22 DIAGNOSIS — S3690XA Unspecified injury of unspecified intra-abdominal organ, initial encounter: Secondary | ICD-10-CM

## 2013-03-22 MED ORDER — IOHEXOL 300 MG/ML  SOLN
100.0000 mL | Freq: Once | INTRAMUSCULAR | Status: AC | PRN
  Administered 2013-03-22: 100 mL via INTRAVENOUS

## 2013-04-03 DIAGNOSIS — I2510 Atherosclerotic heart disease of native coronary artery without angina pectoris: Secondary | ICD-10-CM | POA: Diagnosis not present

## 2013-04-03 DIAGNOSIS — S3690XA Unspecified injury of unspecified intra-abdominal organ, initial encounter: Secondary | ICD-10-CM | POA: Diagnosis not present

## 2013-04-03 DIAGNOSIS — D649 Anemia, unspecified: Secondary | ICD-10-CM | POA: Diagnosis not present

## 2013-04-03 DIAGNOSIS — I10 Essential (primary) hypertension: Secondary | ICD-10-CM | POA: Diagnosis not present

## 2013-04-09 ENCOUNTER — Ambulatory Visit (INDEPENDENT_AMBULATORY_CARE_PROVIDER_SITE_OTHER): Payer: Medicare Other | Admitting: General Surgery

## 2013-04-09 ENCOUNTER — Encounter (INDEPENDENT_AMBULATORY_CARE_PROVIDER_SITE_OTHER): Payer: Self-pay | Admitting: General Surgery

## 2013-04-09 VITALS — BP 140/70 | HR 90 | Temp 98.7°F | Resp 18 | Ht 67.0 in | Wt 176.4 lb

## 2013-04-09 DIAGNOSIS — R1903 Right lower quadrant abdominal swelling, mass and lump: Secondary | ICD-10-CM

## 2013-04-09 NOTE — Progress Notes (Signed)
Patient ID: [name] [name], male   DOB: [birth year], 79 y.o.   MRN: [id number]  Chief Complaint  Patient presents with  . Other    Hematoma    HPI [name] [name] is a 79 y.o. male.  He is referred by Dr. Marinda Elk for evaluation of right lower quadrant intra-abdominal mass.   The patient states that last month he rotated the tarsal his car but really did not have any trauma or fall or injury. The next day he started having pain in his right lower quadrant. The following day he had a syncopal episode and had some chills and riders and still had tenderness in his right lower quadrant. He was taken to the emergency room and was admitted to the hospital. Mild anemia and hypotension  was noted that  did not require transfusion. Denies nausea or vomiting or change in his bowel habits.  Shortly after admission he had a CT scan of the abdomen and pelvis which showed a heterogeneous density, 6.2 x 5.7 cm mass within the right lower abdomen, intimately associated with a loop of proximal ileum. Some internal necrosis was suggested. Differential diagnosis included stromal tumor, metastatic disease, or lymphoma. 2 days later, March 14, he underwent CT-guided biopsy and aspiration of this mass. Again a total of 80 cc of bloody fluid out. Pathology showed minimal cellular material, blood elements. No neoplasia. Followup CT scan on April 4 showed this mass to be present but now down to 4.8 x 4.8 cm with some air, probably related to the biopsy. The patient was told he had a hematoma.  During his hospital stay he underwent cardiac catheterization on 03/05/2013 by Dr. Peter Swaziland. He was told he may have had NSTEMI. He was told that he probably needed a stent at some point but did not think that can be done because he might have had bleeding his abdomen and he could not be anticoagulated. He has a followup with Dr. Swaziland this Friday.  Last blood work showed a hemoglobin of 10.1 on April 16.  Last colonoscopy was 2 years  ago, Dr. Bosie Clos, [facility].[name] Nothing unusual according to the patient.  The patient states that his right lower quadrant pain has resolved. He is feeling fine. He is here with his wife. He is very alert and oriented and cooperative. He looks very fit for his age. HPI  Past Medical History  Diagnosis Date  . Hypertension   . Hyperlipidemia   . NSTEMI (non-ST elevated myocardial infarction) 02/2013  . Abdominal mass 02/2013  . Pneumonia   . UTI (urinary tract infection)     Past Surgical History  Procedure Laterality Date  . Cyst excision  1962    wrist    History reviewed. No pertinent family history.  Social History History  Substance Use Topics  . Smoking status: Never Smoker   . Smokeless tobacco: Never Used  . Alcohol Use: No    No Known Allergies  Current Outpatient Prescriptions  Medication Sig Dispense Refill  . amLODipine-benazepril (LOTREL) 10-20 MG per capsule Take 1 capsule by mouth daily.      [name] aspirin 81 MG chewable tablet Chew 81 mg by mouth daily.      . ferrous sulfate (FERROUSUL) 325 (65 FE) MG tablet Take 1 tablet (325 mg total) by mouth daily with breakfast.  30 tablet  3  . metoprolol tartrate (LOPRESSOR) 25 MG tablet Take 1 tablet (25 mg total) by mouth 2 (two) times daily.  60 tablet  2  . simvastatin (ZOCOR) 20 MG tablet Take 20 mg by mouth every evening.      . zolpidem (AMBIEN) 5 MG tablet Take 5 mg by mouth at bedtime as needed.        No current facility-administered medications for this visit.    Review of Systems Review of Systems  Constitutional: Negative for fever, chills and unexpected weight change.  HENT: Negative for hearing loss, congestion, sore throat, trouble swallowing and voice change.   Eyes: Negative for visual disturbance.  Respiratory: Negative for cough and wheezing.   Cardiovascular: Negative for chest pain, palpitations and leg swelling.  Gastrointestinal: Positive for abdominal pain. Negative for nausea, vomiting,  diarrhea, constipation, blood in stool, abdominal distention, anal bleeding and rectal pain.  Genitourinary: Negative for hematuria and difficulty urinating.  Musculoskeletal: Negative for arthralgias.  Skin: Negative for rash and wound.  Neurological: Negative for seizures, syncope, weakness and headaches.  Hematological: Negative for adenopathy. Does not bruise/bleed easily.  Psychiatric/Behavioral: Negative for confusion.    Blood pressure 140/70, pulse 90, temperature 98.7 F (37.1 C), temperature source Oral, resp. rate 18, height 5\' 7"  (1.702 m), weight 176 lb 6 oz (80.003 kg).  Physical Exam Physical Exam  Constitutional: He is oriented to person, place, and time. He appears well-developed and well-nourished. No distress.  HENT:  Head: Normocephalic.  Nose: Nose normal.  Mouth/Throat: No oropharyngeal exudate.  Eyes: Conjunctivae and EOM are normal. Pupils are equal, round, and reactive to light. Right eye exhibits no discharge. Left eye exhibits no discharge. No scleral icterus.  Neck: Normal range of motion. Neck supple. No JVD present. No tracheal deviation present. No thyromegaly present.  Cardiovascular: Normal rate, regular rhythm, normal heart sounds and intact distal pulses.   No murmur heard. Pulmonary/Chest: Effort normal and breath sounds normal. No stridor. No respiratory distress. He has no wheezes. He has no rales. He exhibits no tenderness.  Abdominal: Soft. Bowel sounds are normal. He exhibits no distension and no mass. There is no tenderness. There is no rebound and no guarding.  No tenderness, specifically no right lower quadrant tenderness. No palpable mass. No hernia.  Musculoskeletal: Normal range of motion. He exhibits no edema and no tenderness.  Lymphadenopathy:    He has no cervical adenopathy.  Neurological: He is alert and oriented to person, place, and time. He has normal reflexes. Coordination normal.  Skin: Skin is warm and dry. No rash noted. He is  not diaphoretic. No erythema. No pallor.  Psychiatric: He has a normal mood and affect. His behavior is normal. Judgment and thought content normal.    Data Reviewed Extensive hospital records, imaging studies, cytopathology report, [facility] physician's records. I have reviewed his CT scans with Dr. Hulan Amato of radiology.  Assessment    Right lower quadrant abdominal mass. This may be a mesenteric hematoma in the small bowel mesentery. It is also possible that this is a neoplastic process. The reduction in size and the resolution of symptoms suggest the possibility of a self-limited process.  If this was a spontaneous hemorrhage, the etiology is unclear. Mesenteric aneurysm is a rare but possible problem that could cause this.  There is no immediate need for surgical intervention. If the mass resolves and is a self-limited process that is almost certainly was a hematoma. If the mass enlarges again, then he will need to have an operation to define the diagnosis and to rule out neoplasia   Hypertension  Probable recent NSTEMI.    Plan  No immediate plans for surgical intervention  Schedule CT angiogram of abdomen and pelvis in May, and followup with me after that is done. This will let us know whether the hematoma is resolving and it will help screening for mesenteric aneurysm.  He will see Dr. Peter Swaziland later this week for cardiac followup.  Dr. Foy Guadalajara  his primary care physician.        Angelia Mould. Derrell Lolling, M.D., [facility] General and Minimally invasive Surgery Breast and Colorectal Surgery Office:   [phone] Pager:   [phone]  04/09/2013, 5:21 PM

## 2013-04-09 NOTE — Patient Instructions (Addendum)
I suspect that the pain in your abdomen and the lump that was seen on the CT scan is due to a spontaneous bleeding. It is possible that there is a malignancy, but this seems less likely considering the biopsy results.  For now, I do not think that you need an operation. Hopefully this will resolved without any further intervention.  It is important that you followup to be sure that this completely resolves. If this lump in your abdomen were to enlarge, then we would have to consider an operation.  Avoid sports or heavy lifting for now.  You will be scheduled for a CT angiogram of the abdomen and pelvis sometime in May, and you'll be scheduled to see Dr. Derrell Lolling after that x-ray is done.

## 2013-04-12 ENCOUNTER — Ambulatory Visit (INDEPENDENT_AMBULATORY_CARE_PROVIDER_SITE_OTHER): Payer: Medicare Other | Admitting: Nurse Practitioner

## 2013-04-12 ENCOUNTER — Encounter: Payer: Self-pay | Admitting: Nurse Practitioner

## 2013-04-12 ENCOUNTER — Telehealth: Payer: Self-pay

## 2013-04-12 VITALS — BP 118/72 | HR 66 | Ht 67.0 in | Wt 174.0 lb

## 2013-04-12 DIAGNOSIS — I214 Non-ST elevation (NSTEMI) myocardial infarction: Secondary | ICD-10-CM | POA: Diagnosis not present

## 2013-04-12 NOTE — Progress Notes (Signed)
[name] [name] Date of Birth: 29-Dec-1928 Medical Record #[id number]  History of Present Illness: [name] [name] is seen back today for a 3 week check. He is seen for Dr. Swaziland. He had a near syncopal spell with an NSTEMI in [place]. Also found to have a large abdominal mass at that time with non diagnostic pathology (possible complex hematoma). He was cathed and has an occluded LAD with excellent right to left collaterals. EF is 50%. LAD appears suitable for PCI but given the possible abdominal hematoma, medical therapy was instituted.   His other issues include HTN, HLD and anemia.  Seen 3 weeks ago and was doing well.   He comes back today. He is here alone. Doing well. No complaints. He had his repeat CT scan and was referred to Dr. Derrell Lolling. His note was reviewed. Still unclear as to the etiology. His symptoms have improved and the size had decreased apparently. Dr. Derrell Lolling is planning CT angio next month with follow up. From our standpoint, he is doing ok. No chest pain. Not short of breath. Not dizzy or lightheaded. Did have some swelling in his feet and called Dr. Foy Guadalajara who put him back on HCTZ with resolution. Dr. Foy Guadalajara is following all his labs. Very minimal right sided abdominal pain.  Current Outpatient Prescriptions on File Prior to Visit  Medication Sig Dispense Refill  . amLODipine-benazepril (LOTREL) 10-20 MG per capsule Take 1 capsule by mouth daily.      [name] aspirin 81 MG chewable tablet Chew 81 mg by mouth daily.      . ferrous sulfate (FERROUSUL) 325 (65 FE) MG tablet Take 1 tablet (325 mg total) by mouth daily with breakfast.  30 tablet  3  . metoprolol tartrate (LOPRESSOR) 25 MG tablet Take 1 tablet (25 mg total) by mouth 2 (two) times daily.  60 tablet  2  . simvastatin (ZOCOR) 20 MG tablet Take 20 mg by mouth every evening.      . zolpidem (AMBIEN) 5 MG tablet Take 5 mg by mouth at bedtime as needed.        No current facility-administered medications on file prior to visit.    No  Known Allergies  Past Medical History  Diagnosis Date  . Hypertension   . Hyperlipidemia   . NSTEMI (non-ST elevated myocardial infarction) 02/2013  . Abdominal mass 02/2013  . Pneumonia   . UTI (urinary tract infection)     Past Surgical History  Procedure Laterality Date  . Cyst excision  1962    wrist    History  Smoking status  . Never Smoker   Smokeless tobacco  . Never Used    History  Alcohol Use No    History reviewed. No pertinent family history.  Review of Systems: The review of systems is per the HPI.  All other systems were reviewed and are negative.  Physical Exam: BP 118/72  Pulse 66  Ht 5\' 7"  (1.702 m)  Wt 174 lb (78.926 kg)  BMI 27.25 kg/m2 Patient is very pleasant and in no acute distress. Skin is warm and dry. Color is normal.  HEENT is unremarkable. Normocephalic/atraumatic. PERRL. Sclera are nonicteric. Neck is supple. No masses. No JVD. Lungs are clear. Cardiac exam shows a regular rate and rhythm. Abdomen is soft. Extremities are without edema. Gait and ROM are intact. No gross neurologic deficits noted.  LABORATORY DATA: Lab Results  Component Value Date   WBC 7.9 03/06/2013   HGB 7.9* 03/06/2013   HCT 24.5*  03/06/2013   PLT 304 03/06/2013   GLUCOSE 100* 03/06/2013   ALT 19 02/28/2013   AST 28 02/28/2013   NA 146* 03/06/2013   K 3.8 03/06/2013   CL 112 03/06/2013   CREATININE 1.06 03/06/2013   BUN 18 03/06/2013   CO2 25 03/06/2013   INR 1.17 03/01/2013    Assessment / Plan: 1. Recent NSTEMI with single vessel CAD of the LAD - EF at 50% - with good collaterals from the RCA - will continue with medical management in light of the abdominal abnormality. He is currently without symptoms and doing well.   2. HTN - good BP control.  3. Pedal edema - resolved with restart of his HCTZ - he has his labs with Dr. Foy Guadalajara.  4. Abdominal mass - to see Dr. Derrell Lolling again in May following CTA  Patient is agreeable to this plan and will call if any problems  develop in the interim.   Rosalio Macadamia, RN, ANP-C  [facility] [address] [place], [place]  [address]

## 2013-04-12 NOTE — Telephone Encounter (Signed)
Per pt request, I left a detailed message on pts VM that his f/u appt with Dr Swaziland is scheduled for June 13 at 3:30.

## 2013-04-12 NOTE — Patient Instructions (Addendum)
Stay on your current medicines  Dr. Swaziland will see you back in 6 weeks for further discussion and hope to set up your stent procedure at that time  Call the [facility] office at [phone] if you have any questions, problems or concerns.

## 2013-04-25 ENCOUNTER — Telehealth (INDEPENDENT_AMBULATORY_CARE_PROVIDER_SITE_OTHER): Payer: Self-pay

## 2013-04-25 DIAGNOSIS — R1900 Intra-abdominal and pelvic swelling, mass and lump, unspecified site: Secondary | ICD-10-CM | POA: Diagnosis not present

## 2013-04-25 LAB — CREATININE, SERUM: Creat: 1.18 mg/dL (ref 0.50–1.35)

## 2013-04-25 LAB — BUN: BUN: 18 mg/dL (ref 6–23)

## 2013-04-25 NOTE — Telephone Encounter (Signed)
[facility] labs calling b/c pt there for labwork. I placed the order for BUN/Creatinine b/c pt sch. For CT angio ABD/Pelvis on 04/26/13.

## 2013-04-26 ENCOUNTER — Ambulatory Visit
Admission: RE | Admit: 2013-04-26 | Discharge: 2013-04-26 | Disposition: A | Discharge status: Home / Self Care | Payer: Medicare Other | Source: Ambulatory Visit | Attending: General Surgery | Admitting: General Surgery

## 2013-04-26 DIAGNOSIS — S36899A Unspecified injury of other intra-abdominal organs, initial encounter: Secondary | ICD-10-CM | POA: Diagnosis not present

## 2013-04-26 DIAGNOSIS — R1903 Right lower quadrant abdominal swelling, mass and lump: Secondary | ICD-10-CM

## 2013-04-26 MED ORDER — IOHEXOL 350 MG/ML SOLN
80.0000 mL | Freq: Once | INTRAVENOUS | Status: AC | PRN
  Administered 2013-04-26: 80 mL via INTRAVENOUS

## 2013-05-01 DIAGNOSIS — R1903 Right lower quadrant abdominal swelling, mass and lump: Secondary | ICD-10-CM | POA: Diagnosis not present

## 2013-05-01 DIAGNOSIS — I10 Essential (primary) hypertension: Secondary | ICD-10-CM | POA: Diagnosis not present

## 2013-05-01 DIAGNOSIS — D649 Anemia, unspecified: Secondary | ICD-10-CM | POA: Diagnosis not present

## 2013-05-01 DIAGNOSIS — I2510 Atherosclerotic heart disease of native coronary artery without angina pectoris: Secondary | ICD-10-CM | POA: Diagnosis not present

## 2013-05-07 ENCOUNTER — Encounter (INDEPENDENT_AMBULATORY_CARE_PROVIDER_SITE_OTHER): Payer: Self-pay | Admitting: General Surgery

## 2013-05-07 ENCOUNTER — Ambulatory Visit (INDEPENDENT_AMBULATORY_CARE_PROVIDER_SITE_OTHER): Payer: Medicare Other | Admitting: General Surgery

## 2013-05-07 VITALS — BP 150/70 | HR 76 | Temp 98.9°F | Resp 18 | Ht 67.0 in | Wt 172.0 lb

## 2013-05-07 DIAGNOSIS — R1903 Right lower quadrant abdominal swelling, mass and lump: Secondary | ICD-10-CM | POA: Diagnosis not present

## 2013-05-07 NOTE — Patient Instructions (Addendum)
Your recent CT scan suggests that the mass in the right lower abdomen is a neoplastic growth, possibly a lymphoma. He will need to have an operation to remove this mass and any intestine that is attached to it.  We will discuss this with Dr. Peter Swaziland before doing the surgery to make sure that we do whatever is necessary to protect your heart.  You will undergo a bowel prep the day before which will include a a strong laxative as well as antibiotic pills.  You will probably be hospitalized for 5 days or so.

## 2013-05-07 NOTE — Progress Notes (Signed)
Patient ID: [name] [name], male   DOB: [birth year], 79 y.o.   MRN: [id number]  Chief Complaint  Patient presents with  . Follow-up    f/u post ct angio    HPI [name] [name] is a 79 y.o. male.  He returns with his wife for further discussion regarding management of the mesenteric mass noted in his right lower quadrant.  CT angiogram was performed I have discussed this with Dr. Fredia Sorrow. There is no aneurysm. This was not typical for a resolving hematoma. There does appear to be some blood vessels entering the soft tissue abnormality. Dr. 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 There may also prior small bowel loops and a fistula cannot be excluded.  The patient is essentially asymptomatic now, although back in March around the time he had his NSTEMI  he was having right lower quadrant pain. His appetite is normal. His bowel function is normal.  He has been seen by [facility] heart care on May 5. They are holding off on cardiac catheter with stent until we decide what to do about his abdominal mass. I will need to discuss his care with Dr. Swaziland before going ahead with surgery.  I told the patient that I did not think that any further testing needed to be done. He had a colonoscopy 2 years ago with Dr. Bosie Clos which was unremarkable, according to the patient. We have requested this report. Biopsy of this mass does reveal some bloody fluid and minimal cellular material. I did not think that repeating that percutaneous biopsy would be helpful.  I discussed the differential diagnosis with the patient and his wife. He is strongly motivated to have an operation to remove this and would like to go ahead if Dr. Swaziland and I feel this is safe.  HPI  Past Medical History  Diagnosis Date  . Hypertension   . Hyperlipidemia   . NSTEMI (non-ST elevated myocardial infarction) 02/2013  . Abdominal mass 02/2013  . Pneumonia   . UTI (urinary tract  infection)     Past Surgical History  Procedure Laterality Date  . Cyst excision  1962    wrist    History reviewed. No pertinent family history.  Social History History  Substance Use Topics  . Smoking status: Never Smoker   . Smokeless tobacco: Never Used  . Alcohol Use: No    No Known Allergies  Current Outpatient Prescriptions  Medication Sig Dispense Refill  . amLODipine-benazepril (LOTREL) 10-20 MG per capsule Take 1 capsule by mouth daily.      [name] aspirin 81 MG chewable tablet Chew 81 mg by mouth daily.      . ferrous sulfate (FERROUSUL) 325 (65 FE) MG tablet Take 1 tablet (325 mg total) by mouth daily with breakfast.  30 tablet  3  . hydrochlorothiazide (HYDRODIURIL) 25 MG tablet Take 25 mg by mouth daily.      . metoprolol tartrate (LOPRESSOR) 25 MG tablet Take 1 tablet (25 mg total) by mouth 2 (two) times daily.  60 tablet  2  . simvastatin (ZOCOR) 20 MG tablet Take 20 mg by mouth every evening.       No current facility-administered medications for this visit.    Review of Systems Review of Systems  Constitutional: Negative for fever, chills and unexpected weight change.  HENT: Negative for hearing loss, congestion, sore throat, trouble swallowing and voice change.   Eyes: Negative for visual disturbance.  Respiratory: Negative  for cough and wheezing.   Cardiovascular: Negative for chest pain, palpitations and leg swelling.  Gastrointestinal: Negative for nausea, vomiting, abdominal pain, diarrhea, constipation, blood in stool, abdominal distention, anal bleeding and rectal pain.  Genitourinary: Negative for hematuria and difficulty urinating.  Musculoskeletal: Negative for arthralgias.  Skin: Negative for rash and wound.  Neurological: Negative for seizures, syncope, weakness and headaches.  Hematological: Negative for adenopathy. Does not bruise/bleed easily.  Psychiatric/Behavioral: Negative for confusion.    Blood pressure 150/70, pulse 76, temperature  98.9 F (37.2 C), temperature source Temporal, resp. rate 18, height 5\' 7"  (1.702 m), weight 172 lb (78.019 kg).  Physical Exam Physical Exam  Constitutional: He is oriented to person, place, and time. He appears well-developed and well-nourished. No distress.  HENT:  Head: Normocephalic.  Nose: Nose normal.  Mouth/Throat: No oropharyngeal exudate.  Eyes: Conjunctivae and EOM are normal. Pupils are equal, round, and reactive to light. Right eye exhibits no discharge. Left eye exhibits no discharge. No scleral icterus.  Neck: Normal range of motion. Neck supple. No JVD present. No tracheal deviation present. No thyromegaly present.  Cardiovascular: Normal rate, regular rhythm, normal heart sounds and intact distal pulses.   No murmur heard. Pulmonary/Chest: Effort normal and breath sounds normal. No stridor. No respiratory distress. He has no wheezes. He has no rales. He exhibits no tenderness.  Abdominal: Soft. Bowel sounds are normal. He exhibits no distension and no mass. There is no tenderness. There is no rebound and no guarding.  No mass. No tenderness. No bruit. No hernia.  Musculoskeletal: Normal range of motion. He exhibits no edema and no tenderness.  Lymphadenopathy:    He has no cervical adenopathy.  Neurological: He is alert and oriented to person, place, and time. He has normal reflexes. Coordination normal.  Skin: Skin is warm and dry. No rash noted. He is not diaphoretic. No erythema. No pallor.  Psychiatric: He has a normal mood and affect. His behavior is normal. Judgment and thought content normal.    Data Reviewed [place] cardiology notes. CT angiogram. My old records.  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 Imaging findings worrisome for a neoplastic process and possible lymphoma. Exploration and resection is indicated, and the patient strongly agrees.  Hypertension  Recent NSTEMI  Very good  performance status     Plan    Scheduled for exploraatory laparotomy, resection of mesenteric mass and involved intestine. This may require a modified right colectomy.  One-day bowel prep preop  Cardiac clearance with Dr. Peter Swaziland. We will discuss logistics of his coronary disease preop  I discussed the indications, details, techniques, and numerous risks of this with the patient. He is aware there is a risk of bleeding, infection, wound healing problems, hernia, injury to adjacent organs, colostomy, cardiac, pulmonary, thromboembolic problems, as well as other unanticipated problems. He understands all these issues and all his questions were answered. Both he and his wife agree with this plan.        Angelia Mould. Derrell Lolling, M.D., [facility] General and Minimally invasive Surgery Breast and Colorectal Surgery Office:   [phone] Pager:   [phone]  05/07/2013, 5:17 PM

## 2013-05-10 ENCOUNTER — Telehealth: Payer: Self-pay

## 2013-05-10 NOTE — Telephone Encounter (Signed)
Received fax form for surgical clearance from Dr.Haywood Ingram's office.Dr.Jordan advised ok to hold aspirin 5 days prior to surgery.Form faxed back to fax # [phone].

## 2013-05-16 ENCOUNTER — Encounter (INDEPENDENT_AMBULATORY_CARE_PROVIDER_SITE_OTHER): Payer: Self-pay

## 2013-05-28 ENCOUNTER — Ambulatory Visit (HOSPITAL_COMMUNITY)
Admission: RE | Admit: 2013-05-28 | Discharge: 2013-05-28 | Disposition: A | Discharge status: Home / Self Care | Payer: Medicare Other | Source: Ambulatory Visit | Attending: General Surgery | Admitting: General Surgery

## 2013-05-28 ENCOUNTER — Encounter (HOSPITAL_COMMUNITY): Payer: Self-pay

## 2013-05-28 ENCOUNTER — Encounter (HOSPITAL_COMMUNITY)
Admission: RE | Admit: 2013-05-28 | Discharge: 2013-05-28 | Disposition: A | Discharge status: Home / Self Care | Payer: Medicare Other | Source: Ambulatory Visit | Attending: General Surgery | Admitting: General Surgery

## 2013-05-28 ENCOUNTER — Telehealth (INDEPENDENT_AMBULATORY_CARE_PROVIDER_SITE_OTHER): Payer: Self-pay

## 2013-05-28 ENCOUNTER — Telehealth: Payer: Self-pay

## 2013-05-28 DIAGNOSIS — Z01818 Encounter for other preprocedural examination: Secondary | ICD-10-CM | POA: Insufficient documentation

## 2013-05-28 DIAGNOSIS — I517 Cardiomegaly: Secondary | ICD-10-CM | POA: Diagnosis not present

## 2013-05-28 DIAGNOSIS — Z01812 Encounter for preprocedural laboratory examination: Secondary | ICD-10-CM | POA: Insufficient documentation

## 2013-05-28 LAB — URINALYSIS, ROUTINE W REFLEX MICROSCOPIC
Glucose, UA: NEGATIVE mg/dL
Leukocytes, UA: NEGATIVE
Protein, ur: NEGATIVE mg/dL
pH: 6.5 (ref 5.0–8.0)

## 2013-05-28 LAB — CBC WITH DIFFERENTIAL/PLATELET
Basophils Absolute: 0 10*3/uL (ref 0.0–0.1)
Basophils Relative: 0 % (ref 0–1)
Eosinophils Absolute: 0.2 10*3/uL (ref 0.0–0.7)
Eosinophils Relative: 3 % (ref 0–5)
MCH: 28.9 pg (ref 26.0–34.0)
MCV: 89.3 fL (ref 78.0–100.0)
Platelets: 243 10*3/uL (ref 150–400)
RDW: 16.5 % — ABNORMAL HIGH (ref 11.5–15.5)
WBC: 6 10*3/uL (ref 4.0–10.5)

## 2013-05-28 LAB — COMPREHENSIVE METABOLIC PANEL
ALT: 22 U/L (ref 0–53)
AST: 20 U/L (ref 0–37)
Albumin: 3.6 g/dL (ref 3.5–5.2)
Calcium: 9 mg/dL (ref 8.4–10.5)
GFR calc Af Amer: 79 mL/min — ABNORMAL LOW (ref >90)
Sodium: 143 mEq/L (ref 135–145)
Total Protein: 7 g/dL (ref 6.0–8.3)

## 2013-05-28 LAB — SURGICAL PCR SCREEN
MRSA, PCR: NEGATIVE
Staphylococcus aureus: NEGATIVE

## 2013-05-28 MED ORDER — CHLORHEXIDINE GLUCONATE 4 % EX LIQD: 1.0000 "application " | Freq: Once | CUTANEOUS | Status: DC

## 2013-05-28 NOTE — Pre-Procedure Instructions (Signed)
Guillermo Nehring  05/28/2013   Your procedure is scheduled on:  Tues, June 17 @ 1:00 PM  Report to [facility] at 11:00 AM.  Call this number if you have problems the morning of surgery: [phone]   Remember:   Do not eat food or drink liquids after midnight.   Take these medicines the morning of surgery with A SIP OF WATER: Amlodipine(Benazepril(Lotrel) and Metoprolol(Lopressor)              Stop taking your Aspirin 5days before surgery per Dr.Jordan.No Goody's,Aleve,Ibuprofen,Fish Oil,or any Herbal Medications    Do not wear jewelry.  Do not wear lotions, powders, or colognes. You may wear deodorant.  Men may shave face and neck.  Do not bring valuables to the hospital.  [facility] is not responsible                   for any belongings or valuables.  Contacts, dentures or bridgework may not be worn into surgery.  Leave suitcase in the car. After surgery it may be brought to your room.  For patients admitted to the hospital, checkout time is 11:00 AM the day of  discharge.   Patients discharged the day of surgery will not be allowed to drive  home.    Special Instructions: Shower using CHG 2 nights before surgery and the night before surgery.  If you shower the day of surgery use CHG.  Use special wash - you have one bottle of CHG for all showers.  You should use approximately 1/3 of the bottle for each shower.   Please read over the following fact sheets that you were given: Pain Booklet, Coughing and Deep Breathing, Blood Transfusion Information, MRSA Information and Surgical Site Infection Prevention

## 2013-05-28 NOTE — Telephone Encounter (Signed)
Faxed note from Dr. Elvis Coil office that states pre-op clearance has already been sent to Dr. Derrell Lolling.

## 2013-05-28 NOTE — Telephone Encounter (Signed)
I already cleared from surgery before and messaged Dr. Derrell Lolling.  Peter Swaziland MD, [facility]

## 2013-05-28 NOTE — Progress Notes (Signed)
DR.PETER Swaziland IS CARDIOLOGIST  ECHO REPORT IN EPIC FROM 02-28-13  STRESS TEST DONE AROUND 1985  HEART CATH IN EPIC FROM 03-05-13  DR.ROBERT FRIED  EKG REPORT IN EPIC FROM 02-28-13

## 2013-05-28 NOTE — Progress Notes (Signed)
REQUESTED CARDIAC CLEARANCE NOTE FROM DR.INGRAM--TO FAX NOW

## 2013-05-28 NOTE — Telephone Encounter (Signed)
Received a call from [place] at [facility] office, she stated surgery center needs cardiac clearance from Dr.Jordan before surgery.Message sent to Dr.Jordan.

## 2013-05-28 NOTE — Telephone Encounter (Signed)
Kristi from pre-admission called requesting a copy of patient's pre-op cardiac clearance.  I printed a copy of the clearance that we rec'd from Dr. Bethanie Dicker, NP.  However, this is not acceptable per pre-admission testing.  I called Dr. Elvis Coil office to request a more detailed pre-op cardiac clearance to send to pre-admit.  Dr. Swaziland is working out of the [employer] today and will address this request when convenient.  Please fax clearance to [facility] @ Pre-Admission [phone] once rec'd.

## 2013-05-29 NOTE — Telephone Encounter (Signed)
Spoke to [place] at [facility] office. Dr.Jordan cleared patient for upcoming surgery.

## 2013-05-31 ENCOUNTER — Ambulatory Visit: Payer: Medicare Other | Admitting: Cardiology

## 2013-06-02 NOTE — H&P (Signed)
[name] [name]   MRN:  [id number]   Description: 79 year old male  Provider: Ernestene Mention, MD  Department: [facility]       Diagnoses    Abdominal mass, RLQ (right lower quadrant)    -  Primary    789.33      Reason for Visit    Follow-up    f/u post ct angio        Current Vitals - Last Recorded    BP Pulse Temp(Src) Resp Ht Wt    150/70 76 98.9 F (37.2 C) (Temporal) 18 5\' 7"  (1.702 m) 172 lb (78.019 kg)     BMI - 26.93 kg/m2                History and Physical    Ernestene Mention, MD    Status: Signed                          HPI [name] [name] is a 79 y.o. male.  He returns with his wife for further discussion regarding management of the mesenteric mass noted in his right lower quadrant. (see my office note dated 04/09/2013).   CT angiogram was performed I have discussed this with Dr. Fredia Sorrow. There is no aneurysm. This was not typical for a resolving hematoma. There does appear to be some blood vessels entering the soft tissue abnormality. Dr. 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 There may also prior small bowel loops and a fistula cannot be excluded.   The patient is essentially asymptomatic now, although back in March around the time he had his NSTEMI  he was having right lower quadrant pain. His appetite is normal. His bowel function is normal.   He has been seen by [facility] heart care on May 5. They are holding off on cardiac catheter with stent until we decide what to do about his abdominal mass. I will need to discuss his care with Dr. Swaziland before going ahead with surgery.   I told the patient that I did not think that any further testing needed to be done. He had a colonoscopy 2 years ago with Dr. Bosie Clos which was unremarkable, according to the patient. We have requested this report. Biopsy of this mass does reveal some bloody fluid and minimal cellular material. I  did not think that repeating that percutaneous biopsy would be helpful.   I discussed the differential diagnosis with the patient and his wife. He is strongly motivated to have an operation to remove this and would like to go ahead if Dr. Swaziland and I feel this is safe.        Past Medical History   Diagnosis  Date   .  Hypertension     .  Hyperlipidemia     .  NSTEMI (non-ST elevated myocardial infarction)  02/2013   .  Abdominal mass  02/2013   .  Pneumonia     .  UTI (urinary tract infection)           Past Surgical History   Procedure  Laterality  Date   .  Cyst excision    1962       wrist        History reviewed. No pertinent family history.   Social History History   Substance Use Topics   .  Smoking status:  Never Smoker    .  Smokeless tobacco:  Never Used   .  Alcohol Use:  No        No Known Allergies    Current Outpatient Prescriptions   Medication  Sig  Dispense  Refill   .  amLODipine-benazepril (LOTREL) 10-20 MG per capsule  Take 1 capsule by mouth daily.         [name]  aspirin 81 MG chewable tablet  Chew 81 mg by mouth daily.         .  ferrous sulfate (FERROUSUL) 325 (65 FE) MG tablet  Take 1 tablet (325 mg total) by mouth daily with breakfast.   30 tablet   3   .  hydrochlorothiazide (HYDRODIURIL) 25 MG tablet  Take 25 mg by mouth daily.         .  metoprolol tartrate (LOPRESSOR) 25 MG tablet  Take 1 tablet (25 mg total) by mouth 2 (two) times daily.   60 tablet   2   .  simvastatin (ZOCOR) 20 MG tablet  Take 20 mg by mouth every evening.             No current facility-administered medications for this visit.        Review of Systems  Constitutional: Negative for fever, chills and unexpected weight change.  HENT: Negative for hearing loss, congestion, sore throat, trouble swallowing and voice change.   Eyes: Negative for visual disturbance.  Respiratory: Negative for cough and wheezing.   Cardiovascular: Negative for chest pain,  palpitations and leg swelling.  Gastrointestinal: Negative for nausea, vomiting, abdominal pain, diarrhea, constipation, blood in stool, abdominal distention, anal bleeding and rectal pain.  Genitourinary: Negative for hematuria and difficulty urinating.  Musculoskeletal: Negative for arthralgias.  Skin: Negative for rash and wound.  Neurological: Negative for seizures, syncope, weakness and headaches.  Hematological: Negative for adenopathy. Does not bruise/bleed easily.  Psychiatric/Behavioral: Negative for confusion.      Blood pressure 150/70, pulse 76, temperature 98.9 F (37.2 C), temperature source Temporal, resp. rate 18, height 5\' 7"  (1.702 m), weight 172 lb (78.019 kg).   Physical Exam   Constitutional: He is oriented to person, place, and time. He appears well-developed and well-nourished. No distress.  HENT:   Head: Normocephalic.   Nose: Nose normal.   Mouth/Throat: No oropharyngeal exudate.  Eyes: Conjunctivae and EOM are normal. Pupils are equal, round, and reactive to light. Right eye exhibits no discharge. Left eye exhibits no discharge. No scleral icterus.  Neck: Normal range of motion. Neck supple. No JVD present. No tracheal deviation present. No thyromegaly present.  Cardiovascular: Normal rate, regular rhythm, normal heart sounds and intact distal pulses.    No murmur heard. Pulmonary/Chest: Effort normal and breath sounds normal. No stridor. No respiratory distress. He has no wheezes. He has no rales. He exhibits no tenderness.  Abdominal: Soft. Bowel sounds are normal. He exhibits no distension and no mass. There is no tenderness. There is no rebound and no guarding.  No mass. No tenderness. No bruit. No hernia.  Musculoskeletal: Normal range of motion. He exhibits no edema and no tenderness.  Lymphadenopathy:    He has no cervical adenopathy.  Neurological: He is alert and oriented to person, place, and time. He has normal reflexes. Coordination normal.   Skin: Skin is warm and dry. No rash noted. He is not diaphoretic. No erythema. No pallor.  Psychiatric: He has a normal mood and affect. His behavior is normal. Judgment and thought content normal.  Data Reviewed [place] cardiology notes. CT angiogram. My old records.   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 Imaging findings worrisome for a neoplastic process and possible lymphoma. Exploration and resection is indicated, and the patient strongly agrees.   Hypertension   Recent NSTEMI   Very good performance status      Plan    Scheduled for exploratory laparotomy, resection of mesenteric mass and involved intestine. This may require a modified right colectomy.   One-day bowel prep preop   Cardiac clearance has been completed with. Dr. Peter Swaziland.  OK to hold ASA 5 days preop.  We will discuss logistics of his coronary disease preop   I discussed the indications, details, techniques, and numerous risks of this with the patient. He is aware there is a risk of bleeding, infection, wound healing problems, hernia, injury to adjacent organs, colostomy, cardiac, pulmonary, thromboembolic problems, as well as other unanticipated problems. He understands all these issues and all his questions were answered. Both he and his wife agree with this plan.           Angelia Mould. Derrell Lolling, M.D., [facility] General and Minimally invasive Surgery Breast and Colorectal Surgery Office:   [phone] Pager:   [phone]

## 2013-06-03 MED ORDER — DEXTROSE 5 % IV SOLN
2.0000 g | INTRAVENOUS | Status: AC
  Administered 2013-06-04: 2 g via INTRAVENOUS
  Filled 2013-06-03: qty 2

## 2013-06-03 NOTE — Progress Notes (Signed)
Pt called to arrive at 1030.spoke with pt

## 2013-06-04 ENCOUNTER — Inpatient Hospital Stay (HOSPITAL_COMMUNITY)
Admission: RE | Admit: 2013-06-04 | Discharge: 2013-06-08 | DRG: 983 | Disposition: A | Discharge status: Home / Self Care | Payer: Medicare Other | Source: Ambulatory Visit | Attending: General Surgery | Admitting: General Surgery

## 2013-06-04 ENCOUNTER — Encounter (HOSPITAL_COMMUNITY): Payer: Self-pay | Admitting: Anesthesiology

## 2013-06-04 ENCOUNTER — Encounter (HOSPITAL_COMMUNITY): Payer: Self-pay | Admitting: *Deleted

## 2013-06-04 ENCOUNTER — Inpatient Hospital Stay (HOSPITAL_COMMUNITY): Payer: Medicare Other | Admitting: Anesthesiology

## 2013-06-04 ENCOUNTER — Encounter (HOSPITAL_COMMUNITY)
Admission: RE | Disposition: A | Discharge status: Home / Self Care | Payer: Self-pay | Source: Ambulatory Visit | Attending: General Surgery

## 2013-06-04 DIAGNOSIS — I10 Essential (primary) hypertension: Secondary | ICD-10-CM | POA: Diagnosis present

## 2013-06-04 DIAGNOSIS — Z7982 Long term (current) use of aspirin: Secondary | ICD-10-CM | POA: Diagnosis not present

## 2013-06-04 DIAGNOSIS — H1089 Other conjunctivitis: Secondary | ICD-10-CM | POA: Diagnosis not present

## 2013-06-04 DIAGNOSIS — D481 Neoplasm of uncertain behavior of connective and other soft tissue: Principal | ICD-10-CM | POA: Diagnosis present

## 2013-06-04 DIAGNOSIS — I252 Old myocardial infarction: Secondary | ICD-10-CM

## 2013-06-04 DIAGNOSIS — D4819 Other specified neoplasm of uncertain behavior of connective and other soft tissue: Secondary | ICD-10-CM | POA: Diagnosis not present

## 2013-06-04 DIAGNOSIS — Z79899 Other long term (current) drug therapy: Secondary | ICD-10-CM | POA: Diagnosis not present

## 2013-06-04 DIAGNOSIS — C494 Malignant neoplasm of connective and soft tissue of abdomen: Secondary | ICD-10-CM | POA: Diagnosis not present

## 2013-06-04 DIAGNOSIS — E785 Hyperlipidemia, unspecified: Secondary | ICD-10-CM | POA: Diagnosis present

## 2013-06-04 DIAGNOSIS — R339 Retention of urine, unspecified: Secondary | ICD-10-CM | POA: Diagnosis not present

## 2013-06-04 DIAGNOSIS — R1909 Other intra-abdominal and pelvic swelling, mass and lump: Secondary | ICD-10-CM | POA: Diagnosis not present

## 2013-06-04 DIAGNOSIS — D483 Neoplasm of uncertain behavior of retroperitoneum: Secondary | ICD-10-CM | POA: Diagnosis not present

## 2013-06-04 DIAGNOSIS — B9689 Other specified bacterial agents as the cause of diseases classified elsewhere: Secondary | ICD-10-CM | POA: Diagnosis not present

## 2013-06-04 DIAGNOSIS — A499 Bacterial infection, unspecified: Secondary | ICD-10-CM | POA: Diagnosis not present

## 2013-06-04 DIAGNOSIS — I2510 Atherosclerotic heart disease of native coronary artery without angina pectoris: Secondary | ICD-10-CM | POA: Diagnosis present

## 2013-06-04 HISTORY — PX: LAPAROTOMY: SHX154

## 2013-06-04 SURGERY — LAPAROTOMY, EXPLORATORY
Anesthesia: General | Site: Abdomen | Wound class: Contaminated

## 2013-06-04 MED ORDER — NEOSTIGMINE METHYLSULFATE 1 MG/ML IJ SOLN
INTRAMUSCULAR | Status: DC | PRN
  Administered 2013-06-04: 4 mg via INTRAVENOUS

## 2013-06-04 MED ORDER — OXYCODONE-ACETAMINOPHEN 5-325 MG PO TABS
1.0000 | ORAL_TABLET | ORAL | Status: DC | PRN
  Administered 2013-06-04: 1 via ORAL
  Filled 2013-06-04: qty 1

## 2013-06-04 MED ORDER — 0.9 % SODIUM CHLORIDE (POUR BTL) OPTIME
TOPICAL | Status: DC | PRN
  Administered 2013-06-04 (×4): 1000 mL

## 2013-06-04 MED ORDER — HEPARIN SODIUM (PORCINE) 5000 UNIT/ML IJ SOLN
5000.0000 [IU] | Freq: Once | INTRAMUSCULAR | Status: AC
  Administered 2013-06-04: 5000 [IU] via SUBCUTANEOUS

## 2013-06-04 MED ORDER — HEPARIN SODIUM (PORCINE) 5000 UNIT/ML IJ SOLN
INTRAMUSCULAR | Status: AC
  Filled 2013-06-04: qty 1

## 2013-06-04 MED ORDER — ALVIMOPAN 12 MG PO CAPS: 12.0000 mg | ORAL_CAPSULE | Freq: Two times a day (BID) | ORAL | Status: DC

## 2013-06-04 MED ORDER — AMLODIPINE BESY-BENAZEPRIL HCL 10-20 MG PO CAPS
1.0000 | ORAL_CAPSULE | Freq: Every day | ORAL | Status: DC
  Filled 2013-06-04: qty 1

## 2013-06-04 MED ORDER — HEPARIN SODIUM (PORCINE) 5000 UNIT/ML IJ SOLN
5000.0000 [IU] | Freq: Three times a day (TID) | INTRAMUSCULAR | Status: DC
  Administered 2013-06-05 – 2013-06-08 (×9): 5000 [IU] via SUBCUTANEOUS
  Filled 2013-06-04 (×17): qty 1

## 2013-06-04 MED ORDER — ONDANSETRON HCL 4 MG PO TABS: 4.0000 mg | ORAL_TABLET | Freq: Four times a day (QID) | ORAL | Status: DC | PRN

## 2013-06-04 MED ORDER — LACTATED RINGERS IV SOLN
INTRAVENOUS | Status: DC
  Administered 2013-06-04 (×2): via INTRAVENOUS

## 2013-06-04 MED ORDER — LIDOCAINE HCL (CARDIAC) 20 MG/ML IV SOLN
INTRAVENOUS | Status: DC | PRN
  Administered 2013-06-04: 40 mg via INTRAVENOUS

## 2013-06-04 MED ORDER — DEXTROSE 5 % IV SOLN
2.0000 g | Freq: Three times a day (TID) | INTRAVENOUS | Status: AC
  Administered 2013-06-04 – 2013-06-05 (×3): 2 g via INTRAVENOUS
  Filled 2013-06-04 (×3): qty 2

## 2013-06-04 MED ORDER — VECURONIUM BROMIDE 10 MG IV SOLR
INTRAVENOUS | Status: DC | PRN
  Administered 2013-06-04: 1 mg via INTRAVENOUS

## 2013-06-04 MED ORDER — ONDANSETRON HCL 4 MG/2ML IJ SOLN: 4.0000 mg | Freq: Once | INTRAMUSCULAR | Status: DC | PRN

## 2013-06-04 MED ORDER — FENTANYL CITRATE 0.05 MG/ML IJ SOLN
INTRAMUSCULAR | Status: DC | PRN
  Administered 2013-06-04: 100 ug via INTRAVENOUS
  Administered 2013-06-04: 50 ug via INTRAVENOUS
  Administered 2013-06-04: 100 ug via INTRAVENOUS

## 2013-06-04 MED ORDER — HYDROCHLOROTHIAZIDE 25 MG PO TABS
25.0000 mg | ORAL_TABLET | Freq: Every day | ORAL | Status: DC
  Administered 2013-06-04 – 2013-06-08 (×5): 25 mg via ORAL
  Filled 2013-06-04 (×5): qty 1

## 2013-06-04 MED ORDER — POTASSIUM CHLORIDE IN NACL 20-0.9 MEQ/L-% IV SOLN
INTRAVENOUS | Status: DC
  Administered 2013-06-04 – 2013-06-08 (×8): via INTRAVENOUS
  Filled 2013-06-04 (×11): qty 1000

## 2013-06-04 MED ORDER — HYDROMORPHONE HCL PF 1 MG/ML IJ SOLN
0.5000 mg | INTRAMUSCULAR | Status: DC | PRN
Start: 2013-06-04 — End: 2013-06-08
  Administered 2013-06-05 – 2013-06-06 (×4): 1 mg via INTRAVENOUS
  Filled 2013-06-04 (×5): qty 1

## 2013-06-04 MED ORDER — HYDROMORPHONE HCL PF 1 MG/ML IJ SOLN
INTRAMUSCULAR | Status: AC
  Administered 2013-06-05: 1 mg via INTRAVENOUS
  Filled 2013-06-04: qty 1

## 2013-06-04 MED ORDER — ROCURONIUM BROMIDE 100 MG/10ML IV SOLN
INTRAVENOUS | Status: DC | PRN
  Administered 2013-06-04: 50 mg via INTRAVENOUS

## 2013-06-04 MED ORDER — ONDANSETRON HCL 4 MG/2ML IJ SOLN
4.0000 mg | Freq: Four times a day (QID) | INTRAMUSCULAR | Status: DC | PRN
  Administered 2013-06-04 – 2013-06-07 (×2): 4 mg via INTRAVENOUS
  Filled 2013-06-04 (×2): qty 2

## 2013-06-04 MED ORDER — ONDANSETRON HCL 4 MG/2ML IJ SOLN
INTRAMUSCULAR | Status: DC | PRN
  Administered 2013-06-04: 4 mg via INTRAVENOUS

## 2013-06-04 MED ORDER — GLYCOPYRROLATE 0.2 MG/ML IJ SOLN
INTRAMUSCULAR | Status: DC | PRN
  Administered 2013-06-04: 0.6 mg via INTRAVENOUS

## 2013-06-04 MED ORDER — PROPOFOL 10 MG/ML IV BOLUS
INTRAVENOUS | Status: DC | PRN
  Administered 2013-06-04: 130 mg via INTRAVENOUS

## 2013-06-04 MED ORDER — HYDROMORPHONE HCL PF 1 MG/ML IJ SOLN
0.2500 mg | INTRAMUSCULAR | Status: DC | PRN
  Administered 2013-06-04 (×2): 0.25 mg via INTRAVENOUS
  Administered 2013-06-04: 0.5 mg via INTRAVENOUS

## 2013-06-04 MED ORDER — SIMVASTATIN 20 MG PO TABS
20.0000 mg | ORAL_TABLET | Freq: Every evening | ORAL | Status: DC
  Administered 2013-06-04 – 2013-06-07 (×4): 20 mg via ORAL
  Filled 2013-06-04 (×5): qty 1

## 2013-06-04 MED ORDER — METOPROLOL TARTRATE 25 MG PO TABS
25.0000 mg | ORAL_TABLET | Freq: Two times a day (BID) | ORAL | Status: DC
  Administered 2013-06-04 – 2013-06-08 (×8): 25 mg via ORAL
  Filled 2013-06-04 (×11): qty 1

## 2013-06-04 SURGICAL SUPPLY — 49 items
BLADE SURG ROTATE 9660 (MISCELLANEOUS) ×1 IMPLANT
CANISTER SUCTION 2500CC (MISCELLANEOUS) ×2 IMPLANT
CHLORAPREP W/TINT 26ML (MISCELLANEOUS) ×2 IMPLANT
CLOTH BEACON ORANGE TIMEOUT ST (SAFETY) ×2 IMPLANT
COVER SURGICAL LIGHT HANDLE (MISCELLANEOUS) ×2 IMPLANT
DRAPE LAPAROSCOPIC ABDOMINAL (DRAPES) ×2 IMPLANT
DRAPE UTILITY 15X26 W/TAPE STR (DRAPE) ×4 IMPLANT
DRAPE WARM FLUID 44X44 (DRAPE) ×2 IMPLANT
ELECT BLADE 6.5 EXT (BLADE) ×2 IMPLANT
ELECT CAUTERY BLADE 6.4 (BLADE) ×2 IMPLANT
ELECT REM PT RETURN 9FT ADLT (ELECTROSURGICAL) ×2
ELECTRODE REM PT RTRN 9FT ADLT (ELECTROSURGICAL) ×1 IMPLANT
GLOVE BIOGEL PI IND STRL 6.5 (GLOVE) IMPLANT
GLOVE BIOGEL PI IND STRL 7.0 (GLOVE) IMPLANT
GLOVE BIOGEL PI INDICATOR 6.5 (GLOVE) ×1
GLOVE BIOGEL PI INDICATOR 7.0 (GLOVE) ×2
GLOVE EUDERMIC 7 POWDERFREE (GLOVE) ×3 IMPLANT
GLOVE SKINSENSE NS SZ7.0 (GLOVE) ×1
GLOVE SKINSENSE STRL SZ7.0 (GLOVE) IMPLANT
GLOVE SURG SS PI 7.0 STRL IVOR (GLOVE) ×2 IMPLANT
GOWN STRL NON-REIN LRG LVL3 (GOWN DISPOSABLE) ×4 IMPLANT
GOWN STRL REIN XL XLG (GOWN DISPOSABLE) ×4 IMPLANT
KIT BASIN OR (CUSTOM PROCEDURE TRAY) ×2 IMPLANT
KIT ROOM TURNOVER OR (KITS) ×2 IMPLANT
LIGASURE IMPACT 36 18CM CVD LR (INSTRUMENTS) ×1 IMPLANT
NS IRRIG 1000ML POUR BTL (IV SOLUTION) ×4 IMPLANT
PACK GENERAL/GYN (CUSTOM PROCEDURE TRAY) ×2 IMPLANT
PAD ARMBOARD 7.5X6 YLW CONV (MISCELLANEOUS) ×2 IMPLANT
RELOAD PROXIMATE 75MM BLUE (ENDOMECHANICALS) ×4 IMPLANT
RELOAD STAPLE 75 3.8 BLU REG (ENDOMECHANICALS) IMPLANT
SPECIMEN JAR LARGE (MISCELLANEOUS) IMPLANT
SPONGE GAUZE 4X4 12PLY (GAUZE/BANDAGES/DRESSINGS) ×1 IMPLANT
SPONGE LAP 18X18 X RAY DECT (DISPOSABLE) ×2 IMPLANT
STAPLER GUN LINEAR PROX 60 (STAPLE) ×1 IMPLANT
STAPLER PROXIMATE 75MM BLUE (STAPLE) ×1 IMPLANT
STAPLER VISISTAT 35W (STAPLE) ×2 IMPLANT
SUCTION POOLE TIP (SUCTIONS) ×2 IMPLANT
SUT PDS AB 1 TP1 96 (SUTURE) ×4 IMPLANT
SUT SILK 2 0 SH CR/8 (SUTURE) ×2 IMPLANT
SUT SILK 2 0 TIES 10X30 (SUTURE) ×2 IMPLANT
SUT SILK 3 0 SH CR/8 (SUTURE) ×2 IMPLANT
SUT SILK 3 0 TIES 10X30 (SUTURE) ×2 IMPLANT
SUT VIC AB 2-0 SH 18 (SUTURE) ×1 IMPLANT
TAPE CLOTH SURG 4X10 WHT LF (GAUZE/BANDAGES/DRESSINGS) ×1 IMPLANT
TOWEL OR 17X24 6PK STRL BLUE (TOWEL DISPOSABLE) ×2 IMPLANT
TOWEL OR 17X26 10 PK STRL BLUE (TOWEL DISPOSABLE) ×2 IMPLANT
TRAY FOLEY CATH 14FRSI W/METER (CATHETERS) ×2 IMPLANT
WATER STERILE IRR 1000ML POUR (IV SOLUTION) IMPLANT
YANKAUER SUCT BULB TIP NO VENT (SUCTIONS) ×1 IMPLANT

## 2013-06-04 NOTE — Transfer of Care (Signed)
Immediate Anesthesia Transfer of Care Note  Patient: [name] [name]  Procedure(s) Performed: Procedure(s) with comments: EXPLORATORY LAPAROTOMY, OPEN RESECTION OF MESENTERIC AND INTESTINAL MASS (N/A) - Small Bowel Resection  Patient Location: PACU  Anesthesia Type:General  Level of Consciousness: awake, alert  and oriented  Airway & Oxygen Therapy: Patient Spontanous Breathing and Patient connected to nasal cannula oxygen  Post-op Assessment: Report given to PACU RN  Post vital signs: Reviewed and stable  Complications: No apparent anesthesia complications

## 2013-06-04 NOTE — Anesthesia Preprocedure Evaluation (Signed)
Anesthesia Evaluation  Patient identified by MRN, date of birth, ID band Patient awake    Reviewed: Allergy & Precautions, H&P , NPO status , Patient's Chart, lab work & pertinent test results  Airway Mallampati: I TM Distance: >3 FB Neck ROM: full    Dental   Pulmonary          Cardiovascular hypertension, + CAD and + Past MI Rhythm:regular Rate:Normal     Neuro/Psych    GI/Hepatic   Endo/Other    Renal/GU Renal disease     Musculoskeletal   Abdominal   Peds  Hematology  (+) anemia ,   Anesthesia Other Findings   Reproductive/Obstetrics                           Anesthesia Physical Anesthesia Plan  ASA: III  Anesthesia Plan: General   Post-op Pain Management:    Induction: Intravenous  Airway Management Planned: Oral ETT  Additional Equipment:   Intra-op Plan:   Post-operative Plan: Extubation in OR  Informed Consent: I have reviewed the patients History and Physical, chart, labs and discussed the procedure including the risks, benefits and alternatives for the proposed anesthesia with the patient or authorized representative who has indicated his/her understanding and acceptance.     Plan Discussed with: CRNA, Surgeon and Anesthesiologist  Anesthesia Plan Comments:         Anesthesia Quick Evaluation

## 2013-06-04 NOTE — Anesthesia Postprocedure Evaluation (Signed)
  Anesthesia Post-op Note  Patient: [name] [name]  Procedure(s) Performed: Procedure(s) with comments: EXPLORATORY LAPAROTOMY, OPEN RESECTION OF MESENTERIC AND INTESTINAL MASS (N/A) - Small Bowel Resection  Patient Location: PACU  Anesthesia Type:General  Level of Consciousness: awake  Airway and Oxygen Therapy: Patient Spontanous Breathing  Post-op Pain: mild  Post-op Assessment: Post-op Vital signs reviewed, Patient's Cardiovascular Status Stable, Respiratory Function Stable, Patent Airway, No signs of Nausea or vomiting and Pain level controlled  Post-op Vital Signs: stable  Complications: No apparent anesthesia complications

## 2013-06-04 NOTE — Preoperative (Signed)
Beta Blockers   Reason not to administer Beta Blockers:Not Applicable 

## 2013-06-04 NOTE — Interval H&P Note (Signed)
History and Physical Interval Note:  06/04/2013 1:59 PM  [name] [name]  has presented today for surgery, with the diagnosis of mesenteric mass  The goals and the various methods of treatment have been discussed with the patient and family. After consideration of risks, benefits and other options for treatment, the patient has consented to  Procedure(s): EXPLORATORY LAPAROTOMY, OPEN RESECTION OF MESENTERIC AND INTESTINAL MASS (N/A) as a surgical intervention .  The patient's history has been reviewed, patient examined, no change in status, stable for surgery.  I have reviewed the patient's chart and labs.  Questions were answered to the patient's satisfaction.     Ernestene Mention

## 2013-06-04 NOTE — Op Note (Signed)
Patient Name:           [name] [name]   Date of Surgery:        06/04/2013  Pre op Diagnosis:      Small bowel mesenteric mass  Post op Diagnosis:    Small bowel mesenteric mass  Procedure:                 Exploratory laparotomy, small bowel resection  Surgeon:                     Angelia Mould. Derrell Lolling, M.D., FACS  Assistant:                      Glenna Fellows, M.D., FACS  Operative Indications:   [name] [name] is a 79 y.o. male. He was referred by Dr. Marinda Elk for evaluation of right lower quadrant intra-abdominal mass.  The patient states that in March, he twisted his torso and was uncomfortable, but really did not have any trauma or fall or injury. The next day he started having pain in his right lower quadrant. The following day he had a syncopal episode and had some chills and riders and still had tenderness in his right lower quadrant. He was taken to the emergency room and was admitted to the hospital. Mild anemia and hypotension was noted that did not require transfusion. Denies nausea or vomiting or change in his bowel habits.  Shortly after admission he had a CT scan of the abdomen and pelvis which showed a heterogeneous density, 6.2 x 5.7 cm mass within the right lower abdomen, intimately associated with a loop of proximal ileum. Some internal necrosis was suggested. Differential diagnosis included stromal tumor, metastatic disease, or lymphoma. 2 days later, March 14, he underwent CT-guided biopsy and aspiration of this mass. Again a total of 80 cc of bloody fluid out. Pathology showed minimal cellular material, blood elements. No neoplasia. Followup CT scan on April 4 showed this mass to be present but now down to 4.8 x 4.8 cm with some air, probably related to the biopsy. The patient was told he had a hematoma.  During his hospital stay he underwent cardiac catheterization on 03/05/2013 by Dr. Peter [place]. He was told he may have had NSTEMI. He was told that he probably needed a stent at  some point but did not think that can be done because he might have had bleeding his abdomen and he could not be anticoagulated. . . He has a mild anemia with a hemoglobin of 11.1 which has remained stable. CT angiogram shows no mesenteric aneurysm it was not felt to be typical for a resolving hematoma. There were some blood vessels entering the soft tissue abnormality and was this was now felt to be more concerning for neoplasm. I advised the patient undergo expiratory laparotomy and resection of this area and he agreed. This care was discussed with Dr. Peter Sw[place] preop. Last colonoscopy was 2 years ago, Dr. Bosie Clos, [facility][name] Nothing unusual according to the patient.  The patient states that his right lower quadrant pain has resolved. He is feeling fine.    Operative Findings:       There was a baseball-sized mass attached to the mid small bowel, midway between the jejunum and ileum. This was hemorrhagic. It was friable and fractured easily. It bled easily. It had not obstructed the small bowel. It was also adherent to the peritoneum in the lower midline above the symphysis pubis  but was easily resected from that area. There was no other abnormality of the small bowel, small bowel mesentery, or colon. The liver, stomach, pancreas, and spleen felt normal. There was no retroperitoneal mass.  Procedure in Detail:          Following the induction of general endotracheal anesthesia a Foley catheter was inserted, the abdomen was prepped and draped in sterile fashion, intravenous antibiotics were given, and a surgical time out was performed.  A midline laparotomy incision was made, partly above and part of below the umbilicus. The fascia was incised in the midline and the abdominal cavity entered and explored with findings as described above. I mobilized the mass up out of the abdominal cavity. I had to resect some of the peritoneum in the lower midline this was resected cleanly. I oversewed the peritoneum  in the lower midline just above the symphysis pubis with interrupted sutures of 3-0 Vicryl. I did not think that this was involving the dome of the bladder. I divided the small bowel about 6 inches proximal, and 6 inches distal to the hemorrhagic mass using a GIA stapling device. Small bowel mesentery felt normal, there was no adenopathy. I divided the small bowel mesentery with the LigaSure device and sent the specimen to the lab with the history attached. Anastomosis was created between the proximal and distal limbs of the small bowel with a GIA stapling device, and the common defect was closed with a TA 60 stapling device. A few silk sutures were placed to reinforce the staple line at critical points. The mesentery was closed with interrupted sutures of 3-0 silk. The abdomen was and pelvis were thoroughly irrigated with saline until the irrigation fluid became clear. There is no signs of any bleeding anywhere in the operative field. The abdomen was thoroughly explored as described above. The midline fascia was closed with a running suture of #1, double-stranded PDS and skin closed with skin staples. Bandages were placed and the patient taken to recovery in stable condition. EBL 200 cc, counts correct, complications none.     Angelia Mould. Derrell Lolling, M.D., FACS General and Minimally Invasive Surgery Breast and Colorectal Surgery  06/04/2013 3:44 PM

## 2013-06-04 NOTE — Anesthesia Procedure Notes (Signed)
Procedure Name: Intubation Date/Time: 06/04/2013 2:27 PM Performed by: Jerilee Hoh Pre-anesthesia Checklist: Patient identified, Emergency Drugs available, Suction available and Patient being monitored Patient Re-evaluated:Patient Re-evaluated prior to inductionOxygen Delivery Method: Circle system utilized Preoxygenation: Pre-oxygenation with 100% oxygen Intubation Type: IV induction Ventilation: Mask ventilation without difficulty and Oral airway inserted - appropriate to patient size Laryngoscope Size: Mac and 4 Grade View: Grade I Tube type: Oral Tube size: 7.5 mm Number of attempts: 1 Airway Equipment and Method: Stylet Placement Confirmation: ETT inserted through vocal cords under direct vision,  positive ETCO2 and breath sounds checked- equal and bilateral Secured at: 22 cm Tube secured with: Tape Dental Injury: Teeth and Oropharynx as per pre-operative assessment

## 2013-06-05 LAB — BASIC METABOLIC PANEL
BUN: 11 mg/dL (ref 6–23)
Calcium: 8.4 mg/dL (ref 8.4–10.5)
Chloride: 104 mEq/L (ref 96–112)
Creatinine, Ser: 0.98 mg/dL (ref 0.50–1.35)
GFR calc Af Amer: 86 mL/min — ABNORMAL LOW (ref >90)

## 2013-06-05 LAB — CBC
HCT: 31.4 % — ABNORMAL LOW (ref 39.0–52.0)
MCH: 29.9 pg (ref 26.0–34.0)
MCHC: 33.1 g/dL (ref 30.0–36.0)
MCV: 90.2 fL (ref 78.0–100.0)
Platelets: 258 10*3/uL (ref 150–400)
RDW: 16.8 % — ABNORMAL HIGH (ref 11.5–15.5)
WBC: 10.1 10*3/uL (ref 4.0–10.5)

## 2013-06-05 MED ORDER — BENAZEPRIL HCL 20 MG PO TABS
20.0000 mg | ORAL_TABLET | Freq: Every day | ORAL | Status: DC
  Administered 2013-06-05 – 2013-06-08 (×4): 20 mg via ORAL
  Filled 2013-06-05 (×3): qty 1

## 2013-06-05 MED ORDER — TAMSULOSIN HCL 0.4 MG PO CAPS
0.4000 mg | ORAL_CAPSULE | Freq: Every day | ORAL | Status: DC
  Administered 2013-06-05 – 2013-06-08 (×4): 0.4 mg via ORAL
  Filled 2013-06-05 (×4): qty 1

## 2013-06-05 MED ORDER — AMLODIPINE BESYLATE 5 MG PO TABS
5.0000 mg | ORAL_TABLET | Freq: Every day | ORAL | Status: DC
  Administered 2013-06-05 – 2013-06-08 (×4): 5 mg via ORAL
  Filled 2013-06-05 (×3): qty 1

## 2013-06-05 NOTE — Progress Notes (Signed)
Pt's foley catheter was dc'd at 0700. Pt unable to void and feels urge to urinate. Bladder scanner showed in bladder. Notified MD. Orders received for I/O cath now and 0.4mg  Flomax started daily with first dose today. Per MD, if pt requires 2nd catheterization, foley catheter is to be inserted. Will continue to monitor pt closely.  Juliane Lack, RN

## 2013-06-05 NOTE — Progress Notes (Signed)
1 Day Post-Op  Subjective: Stable and alert. Good urine output. Comfortable with minimal pain. Denies nausea. Denies chest pain or dyspnea.  Operative findings discussed with patient. Hopefully pathology out by tomorrow.  Morning lab work pending.`  Objective: Vital signs in last 24 hours: Temp:  [98.1 F (36.7 C)-98.6 F (37 C)] 98.4 F (36.9 C) (06/18 0532) Pulse Rate:  [58-95] 95 (06/17 2156) Resp:  [13-20] 16 (06/18 0532) BP: (120-153)/(53-78) 137/56 mmHg (06/18 0532) SpO2:  [93 %-98 %] 96 % (06/18 0532) Weight:  [184 lb 1.4 oz (83.5 kg)] 184 lb 1.4 oz (83.5 kg) (06/17 1708) Last BM Date: 06/04/13  Intake/Output from previous day: 06/17 0701 - 06/18 0700 In: 1800 [I.V.:1800] Out: 800 [Urine:600; Blood:200] Intake/Output this shift: Total I/O In: -  Out: 400 [Urine:400]  General appearance: alert. Cooperative. Oriented. In no distress. Resp: clear to auscultation bilaterally GI: abdomen soft. Slightly distended. Minimal bowel sounds. Wound looks good.  Lab Results:  Results for orders placed during the hospital encounter of 06/04/13 (from the past 24 hour(s))  PREPARE RBC (CROSSMATCH)     Status: None   Collection Time    06/04/13  2:50 PM      Result Value Range   Order Confirmation ORDER PROCESSED BY BLOOD BANK       Studies/Results: @RISRSLT24 @  . amLODipine-benazepril  1 capsule Oral Daily  . cefOXitin  2 g Intravenous Q8H  . heparin  5,000 Units Subcutaneous Q8H  . hydrochlorothiazide  25 mg Oral Daily  . metoprolol tartrate  25 mg Oral BID  . simvastatin  20 mg Oral QPM     Assessment/Plan: s/p Procedure(s): EXPLORATORY LAPAROTOMY, OPEN RESECTION OF MESENTERIC AND INTESTINAL MASS  POD #1. Small bowel resection for small bowel and mesenteric mass. Stable. Resume oral medications, including beta blocker, Lotrel, HCTZ. N.p.o. Except ice chips. Entereg Discontinue Foley Advance activities to ambulation.  VTE prophylaxis. Again [place] heparin  today.  Coronary artery disease. Presumed NSTEMI in March, followed by Dr. Swaziland.  Stable. Asymptomatic.  @PROBHOSP @  LOS: 1 day    Welda Azzarello M. Derrell Lolling, M.D., [facility] General and Minimally invasive Surgery Breast and Colorectal Surgery Office:   [phone] Pager:   [phone]  06/05/2013  . .prob

## 2013-06-06 ENCOUNTER — Encounter (INDEPENDENT_AMBULATORY_CARE_PROVIDER_SITE_OTHER): Payer: Self-pay

## 2013-06-06 ENCOUNTER — Encounter (HOSPITAL_COMMUNITY): Payer: Self-pay | Admitting: General Surgery

## 2013-06-06 LAB — CBC
HCT: 28.1 % — ABNORMAL LOW (ref 39.0–52.0)
MCHC: 33.1 g/dL (ref 30.0–36.0)
Platelets: 204 10*3/uL (ref 150–400)
RDW: 16.4 % — ABNORMAL HIGH (ref 11.5–15.5)
WBC: 8.8 10*3/uL (ref 4.0–10.5)

## 2013-06-06 LAB — BASIC METABOLIC PANEL
BUN: 9 mg/dL (ref 6–23)
Chloride: 106 mEq/L (ref 96–112)
Creatinine, Ser: 0.79 mg/dL (ref 0.50–1.35)
GFR calc Af Amer: >90 mL/min (ref >90)
GFR calc non Af Amer: 81 mL/min — ABNORMAL LOW (ref >90)
Potassium: 3.9 mEq/L (ref 3.5–5.1)

## 2013-06-06 MED ORDER — BISACODYL 10 MG RE SUPP
10.0000 mg | Freq: Once | RECTAL | Status: AC
  Administered 2013-06-06: 10 mg via RECTAL
  Filled 2013-06-06: qty 1

## 2013-06-06 NOTE — Progress Notes (Signed)
2 Days Post-Op  Subjective: Satisfactory progress. Pain control good. Denies nausea. States he is hungry. No stool or flatus.  Required in and out catheter once yesterday, but able to void since. Started on Flomax.  Hemoglobin 10.4, WBC 10,100, potassium 4.4, creatinine 0.98, BUN 11, glucose 127  Objective: Vital signs in last 24 hours: Temp:  [97.9 F (36.6 C)-99.2 F (37.3 C)] 98.4 F (36.9 C) (06/19 0604) Pulse Rate:  [80-98] 94 (06/19 0604) Resp:  [16-18] 18 (06/19 0604) BP: (134-145)/(57-68) 134/57 mmHg (06/19 0604) SpO2:  [92 %-96 %] 92 % (06/19 0604) Last BM Date: 06/04/13  Intake/Output from previous day: 06/18 0701 - 06/19 0700 In: 4329.2 [I.V.:4329.2] Out: 1150 [Urine:1150] Intake/Output this shift:    General appearance: alert. No distress. Cooperative. Mental status normal. Resp: clear to auscultation bilaterally GI: soft. Minimal tenderness. Bowel sounds present. Wound okay, clean and dry.  Lab Results:  Results for orders placed during the hospital encounter of 06/04/13 (from the past 24 hour(s))  CBC     Status: Abnormal   Collection Time    06/06/13  5:45 AM      Result Value Range   WBC 8.8  4.0 - 10.5 K/uL   RBC 3.12 (*) 4.22 - 5.81 MIL/uL   Hemoglobin 9.3 (*) 13.0 - 17.0 g/dL   HCT 16.1 (*) 09.6 - 04.5 %   MCV 90.1  78.0 - 100.0 fL   MCH 29.8  26.0 - 34.0 pg   MCHC 33.1  30.0 - 36.0 g/dL   RDW 40.9 (*) 81.1 - 91.4 %   Platelets 204  150 - 400 K/uL  BASIC METABOLIC PANEL     Status: Abnormal   Collection Time    06/06/13  5:45 AM      Result Value Range   Sodium 140  135 - 145 mEq/L   Potassium 3.9  3.5 - 5.1 mEq/L   Chloride 106  96 - 112 mEq/L   CO2 27  19 - 32 mEq/L   Glucose, Bld 117 (*) 70 - 99 mg/dL   BUN 9  6 - 23 mg/dL   Creatinine, Ser 7.82  0.50 - 1.35 mg/dL   Calcium 8.7  8.4 - 95.6 mg/dL   GFR calc non Af Amer 81 (*) >90 mL/min   GFR calc Af Amer >90  >90 mL/min     Studies/Results: @RISRSLT24 @  . amLODipine  5 mg Oral  Daily   And  . benazepril  20 mg Oral Daily  . heparin  5,000 Units Subcutaneous Q8H  . hydrochlorothiazide  25 mg Oral Daily  . metoprolol tartrate  25 mg Oral BID  . simvastatin  20 mg Oral QPM  . tamsulosin  0.4 mg Oral Daily     Assessment/Plan: s/p Procedure(s): EXPLORATORY LAPAROTOMY, OPEN RESECTION OF MESENTERIC AND INTESTINAL MASS  POD #2. Small bowel resection for small bowel and mesenteric mass. Stable.  Preliminary pathology suggests GIST, spindle cell tumor. Not finalized. Discussed with patient. Resume oral medications, including beta blocker, Lotrel, HCTZ.  Clear liquid diet, advance to full liquids as tolerated   Urinary retention, transient, seems to be improving, started on Flomax.  Continue  ambulation.   VTE prophylaxis. Again [place] heparin today.   Coronary artery disease. Presumed NSTEMI in March, followed by Dr. Swaziland. Stable. Asymptomatic.Continue Norvasc, HCTZ, Lopressor.   @PROBHOSP @  LOS: 2 days    Izik Bingman M. Derrell Lolling, M.D., [facility] General and Minimally invasive Surgery Breast and Colorectal Surgery  Office:   [phone] Pager:   [phone]  06/06/2013  . .prob

## 2013-06-07 NOTE — Progress Notes (Signed)
3 Days Post-Op  Subjective: Feels a little nauseated this morning but has not vomited. He tolerated the liquid diet well yesterday. Denies pain it has not taken any pain medicine over 12 hours. Passing lots of flatus but no stool. Voiding normally. Ambulating in halls.  Final pathology shows gastrointestinal stromal tumor. Margins negative.Immunophenotype and mitotic rate  Place this in  category 3A, which is associated with a 24% risk of malignant behavior. I spent a very long time discussing this with the patient and his son this morning. I advised medical oncology consultation as outpatient.  Objective: Vital signs in last 24 hours: Temp:  [98.4 F (36.9 C)-98.9 F (37.2 C)] 98.9 F (37.2 C) (06/19 2139) Pulse Rate:  [91-105] 105 (06/19 2139) Resp:  [18-20] 20 (06/19 2139) BP: (134-151)/(57-68) 139/62 mmHg (06/19 2139) SpO2:  [92 %-95 %] 94 % (06/19 2139) Last BM Date: 06/04/13  Intake/Output from previous day: 06/19 0701 - 06/20 0700 In: 1031.3 [I.V.:1031.3] Out: 950 [Urine:950] Intake/Output this shift: Total I/O In: 400 [I.V.:400] Out: 350 [Urine:350]  General appearance: alert. Mental status normal. Stable. In no distress. Resp: clear to auscultation bilaterally GI: abdomen soft. Slightly distended. Excellent bowel sounds. Wound clean.  Lab Results:  No results found for this or any previous visit (from the past 24 hour(s)).   Studies/Results: @RISRSLT24 @  . amLODipine  5 mg Oral Daily   And  . benazepril  20 mg Oral Daily  . heparin  5,000 Units Subcutaneous Q8H  . hydrochlorothiazide  25 mg Oral Daily  . metoprolol tartrate  25 mg Oral BID  . simvastatin  20 mg Oral QPM  . tamsulosin  0.4 mg Oral Daily     Assessment/Plan: s/p Procedure(s): EXPLORATORY LAPAROTOMY, OPEN RESECTION OF MESENTERIC AND INTESTINAL MASS  POD #3. Small bowel resection for small bowel and mesenteric mass. Stable.   pathology confirms GIST, spindle cell tumor, 24% risk of malignant  behavior. Plan medical oncology referral as an outpatient. Resume oral medications, including beta blocker, Lotrel, HCTZ.   Nauseated, but otherwise seems to be doing well. Will stay on clear liquid diet, advance to full liquids as tolerated   Urinary retention, transient, seems to be improving, started on Flomax.  Continue ambulation.   VTE prophylaxis. on [place] heparin    Coronary artery disease. Presumed NSTEMI in March, followed by Dr. Sw[place]iland. Stable. Asymptomatic.Continue Norvasc, HCTZ, Lopressor.   @PROBHOSP @  LOS: 3 days    Lyndi Holbein M. Derrell Lolling, M.D., [facility] General and Minimally invasive Surgery Breast and Colorectal Surgery Office:   [phone] Pager:   [phone]  06/07/2013  . .prob

## 2013-06-08 LAB — TYPE AND SCREEN
ABO/RH(D): O NEG
Unit division: 0

## 2013-06-08 MED ORDER — POLYMYXIN B-TRIMETHOPRIM 10000-0.1 UNIT/ML-% OP SOLN
1.0000 [drp] | OPHTHALMIC | Status: DC
  Administered 2013-06-08: 1 [drp] via OPHTHALMIC
  Filled 2013-06-08: qty 10

## 2013-06-08 MED ORDER — OXYCODONE-ACETAMINOPHEN 5-325 MG PO TABS: 1.0000 | ORAL_TABLET | ORAL | Status: DC | PRN

## 2013-06-08 MED ORDER — POLYMYXIN B-TRIMETHOPRIM 10000-0.1 UNIT/ML-% OP SOLN: 1.0000 [drp] | OPHTHALMIC | Status: AC

## 2013-06-08 NOTE — Discharge Summary (Signed)
Physician Discharge Summary  [name] [name] ZOX:[id number] DOB: October 30, 1929 DOA: 06/04/2013  PCP: [name] Boys, MD  Consultation: none  Admit date: 06/04/2013 Discharge date: 06/08/2013  Recommendations for Outpatient Follow-up:    Follow-up Information   Follow up with [facility] [place] On 06/14/2013.   Contact information:   [address] [place] [address] [phone]      Follow up with [name], [name] Cheadle, MD In 1 week. (be sure to follow up to ensure your pink eye has resolved.)    Contact information:   [address] [facility] [address] [phone]       Follow up with [name] Mention, MD. Schedule an appointment as soon as possible for a visit in 2 weeks. (be sure to call on Monday and schedule a follow up appointment.  I have let Dr. Aura [name] nurse know that you will need 2 follow ups, 1 to have your staples removed on the 27th and then a follow up with Dr. Derrell [name])    Contact information:   [address] [place] [address] [phone]      Discharge Diagnoses:  Abdominal mass, gastrointestinal stromal tumor Urinary retention Bacterial conjunctivitis Coronary Artery Disease Hypertension   Surgical Procedure: Exploratory laparotomy, small bowel resection   Discharge Condition: stable Disposition: home  Diet recommendation: high fiber  Filed Weights   06/04/13 1708  Weight: 184 lb 1.4 oz (83.5 kg)    Filed Vitals:   06/08/13 1121  BP: 141/58  Pulse: 80  Temp:   Resp:     Hospital Course:  [name] [name] is a 79 year old male with a history of CAD, NSTEMI, hypertension who underwent a small bowel resection of mass.  The pathology report revealed it is a gastrointestinal stromal tumor.  No complications during the surgery.  On POD #2 he developed urinary retention which resolved with flomax.  His diet was advanced slowly and the patient was ambulated.  His vital signs remained stable.  He was monitored for acute blood loss, which remained  stable.  His pain was treated with oral medication, but did not require much medication at all.  On POD #4 he was tolerating a regular diet, having regular bowel movement, ambulating in the hallways and had stable vital signs.  He was therefore felt safe for discharge.  I noted bilateral ophthalmic discharge and icterus consistent with bacterial conjunctivitis, he was started on polytrim which he was advised to take for a whole week.  He will follow up with his primary care provider in 1 week to ensure resolution. He will need to follow up with Dr. Jacinto [name] nurse to have the staples removed and then a close follow up with Dr. Derrell [name] and oncology.  He was provided with a prescription for pain medication, alternative therapeutics were reviewed as well as self care measures and activity limitations.  His wife was at bedside.  Verbalized understanding.  They were encouraged to call with any questions or concerns.    Discharge Instructions   Future Appointments Provider Department Dept Phone   06/20/2013 8:45 AM [name] Mention, MD [facility] Surgery, [place] [phone]       Medication List    TAKE these medications       amLODipine-benazepril 10-20 MG per capsule  Commonly known as:  LOTREL  Take 1 capsule by mouth daily.     aspirin 81 MG chewable tablet  Chew 81 mg by mouth daily.     ferrous sulfate  325 (65 FE) MG tablet  Commonly known as:  FERROUSUL  Take 1 tablet (325 mg total) by mouth daily with breakfast.     hydrochlorothiazide 25 MG tablet  Commonly known as:  HYDRODIURIL  Take 25 mg by mouth daily.     metoprolol tartrate 25 MG tablet  Commonly known as:  LOPRESSOR  Take 1 tablet (25 mg total) by mouth 2 (two) times daily.     oxyCODONE-acetaminophen 5-325 MG per tablet  Commonly known as:  PERCOCET/ROXICET  Take 1 tablet by mouth every 4 (four) hours as needed.     simvastatin 20 MG tablet  Commonly known as:  ZOCOR  Take 20 mg by mouth every evening.      trimethoprim-polymyxin b ophthalmic solution  Commonly known as:  POLYTRIM  Place 1 drop into both eyes every 4 (four) hours.           Follow-up Information   Follow up with [facility] [place] On 06/14/2013.   Contact information:   [address] [place] [address] [phone]      Follow up with [name], [name] Cheadle, MD In 1 week. (be sure to follow up to ensure your pink eye has resolved.)    Contact information:   [address] [facility] [address] [phone]       Follow up with [name] Mention, MD. Schedule an appointment as soon as possible for a visit in 2 weeks. (be sure to call on Monday and schedule a follow up appointment.  I have let Dr. Aura [name] nurse know that you will need 2 follow ups, 1 to have your staples removed on the 27th and then a follow up with Dr. Derrell [name])    Contact information:   [address] [place] [address] [phone]        The results of significant diagnostics from this hospitalization (including imaging, microbiology, ancillary and laboratory) are listed below for reference.    Significant Diagnostic Studies: Chest 2 View  05/28/2013   *RADIOLOGY REPORT*  Clinical Data: Preoperative evaluation for exploratory laparotomy, resection of the mesenteric and intestinal mass, history pneumonia, hypertension  CHEST - 2 VIEW  Comparison: 02/27/2013  Findings: Enlargement of cardiac silhouette. Mediastinal contours and pulmonary vascularity normal. Lungs clear. No pleural effusion or pneumothorax. Scattered endplate spur formation thoracic spine.  IMPRESSION: No acute abnormalities. Minimal enlargement of cardiac silhouette.   Original Report Authenticated By: [name] [name], M.D.    Microbiology: No results found for this or any previous visit (from the past 240 hour(s)).   Labs: Basic Metabolic Panel:  Recent Labs Lab 06/05/13 0640 06/06/13 0545  NA 139 140  K 4.4 3.9  CL 104 106  CO2 28 27  GLUCOSE 127* 117*  BUN  11 9  CREATININE 0.98 0.79  CALCIUM 8.4 8.7   Liver Function Tests: No results found for this basename: AST, ALT, ALKPHOS, BILITOT, PROT, ALBUMIN,  in the last 168 hours No results found for this basename: LIPASE, AMYLASE,  in the last 168 hours No results found for this basename: AMMONIA,  in the last 168 hours CBC:  Recent Labs Lab 06/05/13 0640 06/06/13 0545  WBC 10.1 8.8  HGB 10.4* 9.3*  HCT 31.4* 28.1*  MCV 90.2 90.1  PLT 258 204    Time coordinating discharge: 30 mins  Signed:  Jemiah Ellenburg, ANP-BC

## 2013-06-08 NOTE — Progress Notes (Signed)
Patient interviewed and examined, agree with PA note above.  Mariella Saa MD, FACS  06/08/2013 9:56 AM

## 2013-06-08 NOTE — Progress Notes (Signed)
4 Days Post-Op  Subjective: Pt is feeling very good today.  He is sitting up at the side of the bed and eating breakfast, on full liquids now. He had a bm and is urinating without any difficulties.  He is ambulating in the hallways.  He is taking little pain medication.  His vital signs are stable and he is afebrile.  He is complaining of ophthalmic discharge which began yesterday. He believes it began on both sides, denies photophobia or sudden vision changes.    Objective: Vital signs in last 24 hours: Temp:  [98.4 F (36.9 C)-99.4 F (37.4 C)] 98.6 F (37 C) (06/21 0540) Pulse Rate:  [82-89] 82 (06/21 0540) Resp:  [16] 16 (06/21 0540) BP: (135-142)/(55-56) 142/55 mmHg (06/21 0540) SpO2:  [92 %-94 %] 92 % (06/21 0540) Last BM Date: 06/04/13  Intake/Output from previous day: 06/20 0701 - 06/21 0700 In: 240 [P.O.:240] Out: 700 [Urine:700] Intake/Output this shift:    General appearance: alert, cooperative, appears stated age and no distress Eyes: conjunctivae/corneas clear. PERRL, EOM's intact. Fundi benign., bilateral injection, purulent drainage without evidence of foreign body Resp: clear to auscultation bilaterally Cardio: regular rate and rhythm, S1, S2 normal, no murmur, click, rub or gallop GI: soft, non-tender; bowel sounds normal; no masses,  no organomegaly.  Midline incision is intact with approximated edges and staples, no erythema or drainage.  Lab Results:   Recent Labs  06/06/13 0545  WBC 8.8  HGB 9.3*  HCT 28.1*  PLT 204   BMET  Recent Labs  06/06/13 0545  NA 140  K 3.9  CL 106  CO2 27  GLUCOSE 117*  BUN 9  CREATININE 0.79  CALCIUM 8.7   Anti-infectives: Anti-infectives   Start     Dose/Rate Route Frequency Ordered Stop   06/04/13 2000  cefOXitin (MEFOXIN) 2 g in dextrose 5 % 50 mL IVPB     2 g 100 mL/hr over 30 Minutes Intravenous Every 8 hours 06/04/13 1720 06/05/13 1302   06/04/13 0600  cefOXitin (MEFOXIN) 2 g in dextrose 5 % 50 mL IVPB      2 g 100 mL/hr over 30 Minutes Intravenous On call to O.R. 06/03/13 1411 06/04/13 1433      Assessment/Plan: EXPLORATORY LAPAROTOMY, OPEN RESECTION OF MESENTERIC AND INTESTINAL MASS  POD #4. Small bowel resection for small bowel and mesenteric mass. Stable.  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 Plan medical oncology referral as an outpatient.  -Nausea has resolved, advance to regular diet -Possible discharge later today or tomorrow Urinary retention -resolved Bacterial conjunctivitis -start polytrim x7 days -self care measures discussed VTE prophylaxis. on [place] heparin  Coronary artery disease. Presumed NSTEMI    LOS: 4 days   Graison Leinberger ANP-BC  06/08/2013

## 2013-06-10 ENCOUNTER — Telehealth (INDEPENDENT_AMBULATORY_CARE_PROVIDER_SITE_OTHER): Payer: Self-pay

## 2013-06-10 DIAGNOSIS — D214 Benign neoplasm of connective and other soft tissue of abdomen: Secondary | ICD-10-CM

## 2013-06-10 NOTE — Telephone Encounter (Signed)
I called and set a follow up for staple removal on Friday with the nurse.  I confirmed his postop with Dr Derrell Lolling on 7/3.  I will also arrange his consultation with Dr Truett Perna as an outpatient.

## 2013-06-14 ENCOUNTER — Ambulatory Visit (INDEPENDENT_AMBULATORY_CARE_PROVIDER_SITE_OTHER): Payer: Medicare Other | Admitting: General Surgery

## 2013-06-14 ENCOUNTER — Encounter (INDEPENDENT_AMBULATORY_CARE_PROVIDER_SITE_OTHER): Payer: Self-pay | Admitting: General Surgery

## 2013-06-14 VITALS — BP 142/62 | HR 71 | Temp 97.6°F | Ht 67.5 in | Wt 164.0 lb

## 2013-06-14 DIAGNOSIS — Z4802 Encounter for removal of sutures: Secondary | ICD-10-CM

## 2013-06-14 NOTE — Progress Notes (Signed)
Patient comes in today s/p exp lap by Dr.Ingram on 06/04/13 for staple removal..pts incision looked very well will zero redness, drainage, or infection.Marland KitchenMarland KitchenAll staples were removed intact and benzoin and 1/2 steri-strips were placed over incision...patient tolerated very well and was giving instructions on bathing and to call the office anytime should any questions or concerns arise...patient verbalized agreement

## 2013-06-18 DIAGNOSIS — H109 Unspecified conjunctivitis: Secondary | ICD-10-CM | POA: Diagnosis not present

## 2013-06-20 ENCOUNTER — Encounter (INDEPENDENT_AMBULATORY_CARE_PROVIDER_SITE_OTHER): Payer: Self-pay | Admitting: General Surgery

## 2013-06-20 ENCOUNTER — Ambulatory Visit (INDEPENDENT_AMBULATORY_CARE_PROVIDER_SITE_OTHER): Payer: Medicare Other | Admitting: General Surgery

## 2013-06-20 VITALS — BP 128/76 | HR 62 | Temp 97.8°F | Resp 11 | Ht 67.5 in | Wt 168.2 lb

## 2013-06-20 DIAGNOSIS — C49A3 Gastrointestinal stromal tumor of small intestine: Secondary | ICD-10-CM | POA: Insufficient documentation

## 2013-06-20 DIAGNOSIS — C179 Malignant neoplasm of small intestine, unspecified: Secondary | ICD-10-CM

## 2013-06-20 NOTE — Patient Instructions (Signed)
You seem to be recovering from your small bowel resection without any obvious surgical complications.  The final pathology showed a gastrointestinal stromal tumor. It has a 24% chance of being malignant. There is no evidence of cancer anywhere else in your body.  You have an appointment to see Dr. Mancel Bale this coming Monday to discuss whether any further treatment would be indicated.  Go ahead and make an appointment with Dr. Peter Swaziland to discuss your plans for cardiac catheterization.  You are restricted to 20 pounds lifting and no sports or strenuous work until after August 1. After August 1 you may resume normal activities  Return to see Dr. Derrell Lolling in 6 months.

## 2013-06-20 NOTE — Progress Notes (Signed)
Patient ID: [name] [name], male   DOB: 1929/10/05, 79 y.o.   MRN: [id number] History: This patient underwent a small bowel resection for a small bowel mass on 06/04/2013. Final pathology showed a gastrointestinal stromal tumor.Given the size of the tumor and the mitotic rate, it was assigned a 24% risk of being malignant. This area was completely resected with negative margins and there was no other disease within the abdomen. He is recovering uneventfully. No pain. Normal appetite. Normal bowel movements. No wound problems. No fever.  Exam: Patient looks well. Wife is with him. Abdomen soft. Nontender. Midline incision healing uneventfully.  Assessment: GIST tumor of mid small bowel. 24% risk of being malignant by size and mitotic rate. Completely resected recovering uneventfully in the early postop period. Coronary artery disease. Needs cardiac catheterization and stent placement by Dr. Peter Swaziland.  Plan: Diet activities discussed Okay to resume normal physical activities without restriction on August 1 of this year He has an appointment to see Dr. Mancel Bale this coming Monday I told him to go ahead and make an appointment to see Dr. Peter Swaziland. I think it would be reasonable for him to consider cardiac catheterization sometime later in July. Return to see me in 6 months.   Angelia Mould. Derrell Lolling, M.D., [place] [facility] General and Minimally invasive Surgery Breast and Colorectal Surgery Office:   [phone] Pager:   [phone]

## 2013-06-24 ENCOUNTER — Ambulatory Visit (HOSPITAL_BASED_OUTPATIENT_CLINIC_OR_DEPARTMENT_OTHER): Payer: Medicare Other | Admitting: Oncology

## 2013-06-24 ENCOUNTER — Ambulatory Visit: Payer: Medicare Other

## 2013-06-24 ENCOUNTER — Encounter: Payer: Self-pay | Admitting: Oncology

## 2013-06-24 ENCOUNTER — Telehealth: Payer: Self-pay | Admitting: Oncology

## 2013-06-24 VITALS — BP 154/71 | HR 81 | Temp 97.4°F | Resp 18 | Ht 67.5 in | Wt 169.9 lb

## 2013-06-24 DIAGNOSIS — D649 Anemia, unspecified: Secondary | ICD-10-CM

## 2013-06-24 DIAGNOSIS — C49A3 Gastrointestinal stromal tumor of small intestine: Secondary | ICD-10-CM

## 2013-06-24 DIAGNOSIS — C179 Malignant neoplasm of small intestine, unspecified: Secondary | ICD-10-CM | POA: Diagnosis not present

## 2013-06-24 NOTE — Progress Notes (Signed)
Checked in new patient. No financial issues. He has no POA/living will. Wants communication via phone/mail.

## 2013-06-24 NOTE — Progress Notes (Signed)
[facility] New Patient Consult   Referring MD: Tjay Velazquez 79 y.o.  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 The following day he developed pain in the right abdomen and a few days later he had a syncopal event. [facility] CT of the abdomen and pelvis revealed a heterogenous mass in the right lower abdomen associated with a loop of proximal ileum. A biopsy on 03/01/2013 was not diagnostic of malignancy. A repeat CT of the abdomen on 03/22/2013 revealed a slight decrease in the size of the right lower quadrant mass. He reports being told the mass likely represented hematoma.  While hospitalized he underwent a cardiac catheterization on 03/05/2013 by Dr. Swaziland. He was felt to have had a myocardial infarction related to an LAD occlusion. He is planned to undergo a percutaneous intervention of the LAD.  He was referred to Dr. Derrell Lolling. A repeat CT of the abdomen and pelvis on 04/26/2013 confirmed a 5.3 cm mass in the right lower mesentery. The liver, pancreas, spleen, adrenal glands, and kidneys appeared unremarkable. No enlarged lymph nodes. No free fluid. The mass was felt to represent a neoplasm or possibly an inflammatory mass related to a contained bowel perforation or fistula.  He was taken the operating room by Dr. Derrell Lolling on 06/04/2013. A mass was attached to the mid small bowel midway between the jejunum and ileum. The mass was hemorrhagic. The mass was friable and adherent to the peritoneum in the lower midline. No other abnormality of the small bowel, and small bowel mesentery, or colon. The liver felt normal. No retroperitoneal mass. The mass and surrounding small bowel were resected.  The pathology ([id number]) confirmed a spindle cell neoplasm with abundant vasculature and up to 4 mitoses per  50 high-powered fields. The tumor cells were positive for CD117 and CD34. The morphology and immunophenotype were diagnostic of a gastrointestinal stromal tumor.  He reports an uneventful operative recovery.  Past Medical History  Diagnosis Date  . Hyperlipidemia     TAKES zOCOR DAILY  . Abdominal mass-small bowel gastrointestinal stromal tumor  02/2013  . UTI (urinary tract infection) 02/2013  . Hypertension     TAKES LOTREL,HCTZ,AND METOPROLOL DAILY  . NSTEMI (non-ST elevated myocardial infarction) 02/2013    "LIGHT: ONE MD SAID HE DID AND ONE SAID HE DIDN'T  . Pneumonia     MAR 2014    Past Surgical History  Procedure Laterality Date  . Cyst excision  1962    wrist  . Cardiac catheterization  03-05-13  . Laparotomy N/A 06/04/2013    Procedure: EXPLORATORY LAPAROTOMY, OPEN RESECTION OF MESENTERIC AND INTESTINAL MASS;  Surgeon: Ernestene Mention, MD;  Location: [facility] OR;  Service: General;  Laterality: N/A;  Small Bowel Resection    Family history: He had 6 sisters and 2 brothers. One sister had breast cancer, another sister had a "gynecologic cancer ". A brother died at age 63 with melanoma. No other family history of cancer.   Current outpatient prescriptions:amLODipine-benazepril (LOTREL) 10-20 MG per capsule, Take 1 capsule by mouth daily., Disp: , Rfl: ;  aspirin 81 MG tablet, Take 81 mg by mouth daily., Disp: , Rfl: ;  hydrochlorothiazide (HYDRODIURIL) 25 MG tablet, Take 25 mg by mouth daily., Disp: , Rfl: ;  metoprolol tartrate (  LOPRESSOR) 25 MG tablet, Take 1 tablet (25 mg total) by mouth 2 (two) times daily., Disp: 60 tablet, Rfl: 2 simvastatin (ZOCOR) 20 MG tablet, Take 20 mg by mouth every evening., Disp: , Rfl:   Allergies: No Known Allergies  Social History: He is retired from a Insurance claims handler. He does not use cigarettes. No alcohol use. No transfusion history. No risk factor for HIV or hepatitis.  ROS:   Positives include: Right abdominal pain prior to surgery  A complete  ROS was otherwise negative.  Physical Exam:  Blood pressure 154/71, pulse 81, temperature 97.4 F (36.3 C), temperature source Oral, resp. rate 18, height 5' 7.5" (1.715 m), weight 169 lb 14.4 oz (77.066 kg).  HEENT: Upper and lower denture plate, oropharynx without visible mass, neck without mass Lungs: Clear bilaterally Cardiac: Regular rate and rhythm, 2/6 systolic murmur Abdomen: Healed midline incision, no hepatomegaly, no mass GU: Testes without mass Vascular: No leg edema Lymph nodes: No cervical, supra-clavicular, axillary, or inguinal nodes Neurologic: Alert and oriented, the motor exam appears intact in the upper and lower extremities Skin: Faint erythema over the anterior chest and upper abdomen Musculoskeletal: No spine tenderness   LAB:  CBC  Lab Results  Component Value Date   WBC 8.8 06/06/2013   HGB 9.3* 06/06/2013   HCT 28.1* 06/06/2013   MCV 90.1 06/06/2013   PLT 204 06/06/2013     CMP      Component Value Date/Time   NA 140 06/06/2013 0545   K 3.9 06/06/2013 0545   CL 106 06/06/2013 0545   CO2 27 06/06/2013 0545   GLUCOSE 117* 06/06/2013 0545   BUN 9 06/06/2013 0545   CREATININE 0.79 06/06/2013 0545   CREATININE 1.18 04/25/2013 1041   CALCIUM 8.7 06/06/2013 0545   PROT 7.0 05/28/2013 1143   ALBUMIN 3.6 05/28/2013 1143   AST 20 05/28/2013 1143   ALT 22 05/28/2013 1143   ALKPHOS 78 05/28/2013 1143   BILITOT 0.2* 05/28/2013 1143   GFRNONAA 81* 06/06/2013 0545   GFRAA >90 06/06/2013 0545   Hemoglobin 8.9, MCV 90.4 on 02/27/2013  Radiology: As per history of present illness    Assessment/Plan:   1. 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 Anemia-? Related to bleeding from the small bowel tumor  3. NSTEMI-March 2014, cardiac catheterization 03/05/2013 confirmed an LAD occlusion  4. Presyncope event 02/27/2013   Disposition:   He has been diagnosed with a gastrointestinal  stromal tumor of small bowel. I discussed the prognosis and adjuvant treatment options with Mr. Stuck and his family. We reviewed the estimated disease-free survival of approximate 75% following surgery alone. This is based on the tumor location, size, and mitotic rate. I reviewed data that have confirmed a disease-free survival benefit with adjuvant Gleevec.  We discussed the potential toxicities associated with Gleevec including the chance for hematologic toxicity, skin rash, diarrhea, fluid retention, and heart failure.  He would like to discuss adjuvant Gleevec therapy with his family. Mr. Theisen will return for an office visit in approximately 2 weeks.   We will check a CBC when he returns in 2 weeks.  Dalten Ambrosino 06/24/2013, 6:22 PM

## 2013-06-24 NOTE — Telephone Encounter (Signed)
gv and printed appt sched and avs forl pt. °

## 2013-06-24 NOTE — Progress Notes (Signed)
Provided patient with handout on Gleevec to review at home.

## 2013-07-09 ENCOUNTER — Ambulatory Visit (HOSPITAL_BASED_OUTPATIENT_CLINIC_OR_DEPARTMENT_OTHER): Payer: Medicare Other | Admitting: Oncology

## 2013-07-09 VITALS — BP 164/64 | HR 84 | Temp 98.4°F | Resp 18 | Ht 67.0 in | Wt 169.4 lb

## 2013-07-09 DIAGNOSIS — C179 Malignant neoplasm of small intestine, unspecified: Secondary | ICD-10-CM | POA: Diagnosis not present

## 2013-07-09 DIAGNOSIS — C49A3 Gastrointestinal stromal tumor of small intestine: Secondary | ICD-10-CM

## 2013-07-09 NOTE — Progress Notes (Signed)
   [facility]    OFFICE PROGRESS NOTE   INTERVAL HISTORY:   He returns as scheduled. He feels well. No complaint.  Objective: Physical exam-not performed today  Vital signs in last 24 hours:  Blood pressure 164/64, pulse 84, temperature 98.4 F (36.9 C), temperature source Oral, resp. rate 18, height 5\' 7"  (1.702 m), weight 169 lb 6.4 oz (76.839 kg).    Lab Results:  Lab Results  Component Value Date   WBC 8.8 06/06/2013   HGB 9.3* 06/06/2013   HCT 28.1* 06/06/2013   MCV 90.1 06/06/2013   PLT 204 06/06/2013      Medications: I have reviewed the patient's current medications.  Assessment/Plan: 1. 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 Anemia-? Related to bleeding from the small bowel tumor  3. NSTEMI-March 2014, cardiac catheterization 03/05/2013 confirmed an LAD occlusion  4. Presyncope event 02/27/2013   Disposition:  I discussed the prognosis and adjuvant Gleevec therapy with Mr. Rapaport and his family again today. We reviewed the potential toxicities associated with Gleevec. He understands no therapy will be curative if he develops recurrent disease, but there is a chance of achieving a clinical remission with systemic therapy.  Mr. Salamone decided against adjuvant Gleevec therapy.  He plans to proceed with a cardiac evaluation by Dr. Swaziland.  A CBC was not checked today. He will see Dr. Foy Guadalajara next week for followup of the anemia. I will be glad to see him if the anemia does not resolve.  Mr. Escamilla will return for an office visit in 4 months.   Thornton Papas, MD  07/09/2013  4:53 PM

## 2013-07-12 ENCOUNTER — Telehealth: Payer: Self-pay | Admitting: Oncology

## 2013-07-12 NOTE — Telephone Encounter (Signed)
pt called and i gv Nov appt....pt ok and aware

## 2013-07-25 DIAGNOSIS — D62 Acute posthemorrhagic anemia: Secondary | ICD-10-CM | POA: Diagnosis not present

## 2013-07-25 DIAGNOSIS — D214 Benign neoplasm of connective and other soft tissue of abdomen: Secondary | ICD-10-CM | POA: Diagnosis not present

## 2013-07-25 DIAGNOSIS — E782 Mixed hyperlipidemia: Secondary | ICD-10-CM | POA: Diagnosis not present

## 2013-07-25 DIAGNOSIS — I10 Essential (primary) hypertension: Secondary | ICD-10-CM | POA: Diagnosis not present

## 2013-07-25 DIAGNOSIS — I2510 Atherosclerotic heart disease of native coronary artery without angina pectoris: Secondary | ICD-10-CM | POA: Diagnosis not present

## 2013-07-26 ENCOUNTER — Encounter: Payer: Self-pay | Admitting: Nurse Practitioner

## 2013-07-26 ENCOUNTER — Ambulatory Visit (INDEPENDENT_AMBULATORY_CARE_PROVIDER_SITE_OTHER): Payer: Medicare Other | Admitting: Nurse Practitioner

## 2013-07-26 VITALS — BP 160/64 | HR 76 | Ht 67.0 in | Wt 170.4 lb

## 2013-07-26 DIAGNOSIS — I259 Chronic ischemic heart disease, unspecified: Secondary | ICD-10-CM

## 2013-07-26 NOTE — Patient Instructions (Addendum)
Continue with your current medicines  We are going to set up a stress test to look at this blockage  If this turns out ok, we will stay with our current plan of care  If this does not turn out ok, we will proceed on with the stent  Let us know if you have any symptoms of chest pain  Keep a check on your blood pressure for [place]  See Dr. Swaziland in 6 months  Call the [facility] office at [phone] if you have any questions, problems or concerns.

## 2013-07-26 NOTE — Progress Notes (Signed)
[name] [name] Date of Birth: Oct 29, 1929 Medical Record #[id number]  History of Present Illness: [name] [name] is seen back today for a follow up visit. Seen for Dr. Swaziland. Had NSTEMI back in March of 2014. Found to have a large abdominal mass at that time with non diagnostic pathology (possible complex hematoma). He was cathed and had an occluded LAD with excellent right to left collaterals. EF was 50%. LAD appeared suitable for PCI but given the possible abdominal hematoma, medical therapy was instituted. His other issues include HTN, HLD and anemia.   Seen back in April. Was doing well. Had had repeat CT and has since ended up having resection - pathology with a stromal tumor - stage 2. Has seen Dr. Truett Perna. He has decided against chemo.   Comes back today. Here alone. He feels well. No chest pain. Not short of breath. Just getting back to his regular activities. Very little belly discomfort. Basically doing everything he wants to be doing. BP was 136/80 at Dr. Pablo Lawrence office yesterday. He reports good BP control at home.   Current Outpatient Prescriptions  Medication Sig Dispense Refill  . amLODipine-benazepril (LOTREL) 10-20 MG per capsule Take 1 capsule by mouth daily.      [name] aspirin 81 MG tablet Take 81 mg by mouth daily.      . hydrochlorothiazide (HYDRODIURIL) 25 MG tablet Take 25 mg by mouth daily.      . metoprolol tartrate (LOPRESSOR) 25 MG tablet Take 1 tablet (25 mg total) by mouth 2 (two) times daily.  60 tablet  2  . simvastatin (ZOCOR) 20 MG tablet Take 20 mg by mouth every evening.       No current facility-administered medications for this visit.    No Known Allergies  Past Medical History  Diagnosis Date  . Hyperlipidemia     TAKES zOCOR DAILY  . Abdominal mass 02/2013  . UTI (urinary tract infection)   . Hypertension     TAKES LOTREL,HCTZ,AND METOPROLOL DAILY  . NSTEMI (non-ST elevated myocardial infarction) 02/2013    "LIGHT: ONE MD SAID HE DID AND ONE SAID HE DIDN'T    . Pneumonia     MAR 2014    Past Surgical History  Procedure Laterality Date  . Cyst excision  1962    wrist  . Cardiac catheterization  03-05-13  . Laparotomy N/A 06/04/2013    Procedure: EXPLORATORY LAPAROTOMY, OPEN RESECTION OF MESENTERIC AND INTESTINAL MASS;  Surgeon: Ernestene Mention, MD;  Location: [facility];  Service: General;  Laterality: N/A;  Small Bowel Resection    History  Smoking status  . Never Smoker   Smokeless tobacco  . Never Used    History  Alcohol Use No    History reviewed. No pertinent family history.  Review of Systems: The review of systems is per the HPI.  All other systems were reviewed and are negative.  Physical Exam: BP 160/64  Pulse 76  Ht 5\' 7"  (1.702 m)  Wt 170 lb 6.4 oz (77.293 kg)  BMI 26.68 kg/m2 Patient is very pleasant and in no acute distress. Skin is warm and dry. Color is normal.  HEENT is unremarkable. Normocephalic/atraumatic. PERRL. Sclera are nonicteric. Neck is supple. No masses. No JVD. Lungs are clear. Cardiac exam shows a regular rate and rhythm. Abdomen is soft. Extremities are without edema. Gait and ROM are intact. No gross neurologic deficits noted.  LABORATORY DATA:  Lab Results  Component Value Date   WBC 8.8 06/06/2013  HGB 9.3* 06/06/2013   HCT 28.1* 06/06/2013   PLT 204 06/06/2013   GLUCOSE 117* 06/06/2013   ALT 22 05/28/2013   AST 20 05/28/2013   NA 140 06/06/2013   K 3.9 06/06/2013   CL 106 06/06/2013   CREATININE 0.79 06/06/2013   BUN 9 06/06/2013   CO2 27 06/06/2013   INR 0.97 05/28/2013    Assessment / Plan: 1. CAD with NSTEMI back in March of 2014 - EF is 50% - has an occluded LAD with excellent right to left collaterals - he is Class I in regards to symptoms. According to the guidelines - really not feasible to proceed with PCI. We will undertake Myoview testing to make sure he does not have a high risk study. He is already on 2 medicines (CCB and BB) therapy along with his statin. We will tentatively see him  back in 6 months unless the Myoview would dictate otherwise.   2. Stromal tumor - followed by Dr. Derrell Lolling and Dr. Truett Perna.   3. HTN - reports better BP control at home.   Patient is agreeable to this plan and will call if any problems develop in the interim.   Rosalio Macadamia, RN, ANP-C [facility] [address] [place], [place]  [address]

## 2013-07-31 ENCOUNTER — Ambulatory Visit (HOSPITAL_COMMUNITY): Payer: Medicare Other | Attending: Cardiovascular Disease | Admitting: Radiology

## 2013-07-31 VITALS — BP 142/60 | HR 60 | Ht 67.0 in | Wt 170.0 lb

## 2013-07-31 DIAGNOSIS — I2510 Atherosclerotic heart disease of native coronary artery without angina pectoris: Secondary | ICD-10-CM | POA: Diagnosis not present

## 2013-07-31 DIAGNOSIS — R9431 Abnormal electrocardiogram [ECG] [EKG]: Secondary | ICD-10-CM | POA: Diagnosis not present

## 2013-07-31 DIAGNOSIS — I10 Essential (primary) hypertension: Secondary | ICD-10-CM | POA: Insufficient documentation

## 2013-07-31 DIAGNOSIS — I259 Chronic ischemic heart disease, unspecified: Secondary | ICD-10-CM

## 2013-07-31 DIAGNOSIS — R55 Syncope and collapse: Secondary | ICD-10-CM

## 2013-07-31 DIAGNOSIS — I214 Non-ST elevation (NSTEMI) myocardial infarction: Secondary | ICD-10-CM

## 2013-07-31 MED ORDER — TECHNETIUM TC 99M SESTAMIBI GENERIC - CARDIOLITE
11.0000 | Freq: Once | INTRAVENOUS | Status: AC | PRN
  Administered 2013-07-31: 11 via INTRAVENOUS

## 2013-07-31 MED ORDER — TECHNETIUM TC 99M SESTAMIBI GENERIC - CARDIOLITE
33.0000 | Freq: Once | INTRAVENOUS | Status: AC | PRN
  Administered 2013-07-31: 33 via INTRAVENOUS

## 2013-07-31 MED ORDER — REGADENOSON 0.4 MG/5ML IV SOLN
0.4000 mg | Freq: Once | INTRAVENOUS | Status: AC
  Administered 2013-07-31: 0.4 mg via INTRAVENOUS

## 2013-07-31 NOTE — Progress Notes (Signed)
[facility] 3 NUCLEAR MED [address] [place], [place] [address] [phone]    Cardiology Nuclear Med Study  [name] [name] is a 79 y.o. male     MRN : [id number]     DOB: Feb 21, 1929  Procedure Date: 07/31/2013  Nuclear Med Background Indication for Stress Test:  Evaluation for Ischemia and Assess the LAD History:  1960's KGM:WNUUVO, per patient; 3/14 NSTEMI>Cath:occluded LAD with collaterals, EF=50%.  medical tx. Cardiac Risk Factors: Hypertension and Lipids  Symptoms:  No cardiac symptoms at present time.   Nuclear Pre-Procedure Caffeine/Decaff Intake:  None NPO After: 8:00pm   Lungs:  Clear. O2 Sat: 96% on room air. IV 0.9% NS with Angio Cath:  22g  IV Site: R Antecubital  IV Started by:  Bonnita Levan, RN  Chest Size (in):  44 Cup Size: n/a  Height: 5\' 7"  (1.702 m)  Weight:  170 lb (77.111 kg)  BMI:  Body mass index is 26.62 kg/(m^2). Tech Comments:  Patient took Lopressor this AM    Nuclear Med Study 1 or 2 day study: 1 day  Stress Test Type:  Lexiscan  Reading MD: Charlton Haws, MD  Order Authorizing Provider:  Jaeshawn Silvio [place], MD  Resting Radionuclide: Technetium 16m Sestamibi  Resting Radionuclide Dose: 11.0 mCi   Stress Radionuclide:  Technetium 22m Sestamibi  Stress Radionuclide Dose: 33.0 mCi           Stress Protocol Rest HR: 60 Stress HR: 116  Rest BP: 142/60 Stress BP: 150/53  Exercise Time (min): 2:00 METS: n/a   Predicted Max HR: 138 bpm % Max HR: 84.06 bpm Rate Pressure Product: 53664   Dose of Adenosine (mg):  n/a Dose of Lexiscan: 0.4 mg  Dose of Atropine (mg): n/a Dose of Dobutamine: n/a mcg/kg/min (at max HR)  Stress Test Technologist: Smiley Houseman, CMA-N  Nuclear Technologist:  Doyne Keel, CNMT     Rest Procedure:  Myocardial perfusion imaging was performed at rest 45 minutes following the intravenous administration of Technetium 28m Sestamibi.  Rest ECG: NSR - Normal EKG  Stress Procedure:  The patient received IV Lexiscan  0.4 mg over 15-seconds.  Technetium 98m Sestamibi injected at 30-seconds.  Quantitative spect images were obtained after a 45 minute delay.  Stress ECG: Significant ST abnormalities consistent with ischemia.  QPS Raw Data Images:  Normal; no motion artifact; normal heart/lung ratio. Stress Images:  There is decreased uptake in the anterior wall. Rest Images:  Normal homogeneous uptake in all areas of the myocardium. Subtraction (SDS):  These findings are consistent with ischemia. Transient Ischemic Dilatation (Normal <1.22):  n/a Lung/Heart Ratio (Normal <0.45):  0.40  Quantitative Gated Spect Images QGS EDV:  114 ml QGS ESV:  58 ml  Impression Exercise Capacity:  Lexiscan with no exercise. BP Response:  Normal blood pressure response. Clinical Symptoms:  No significant symptoms noted. ECG Impression:  Inferolateral ST segment 1mm with lexiscan and low level exercise Comparison with Prior Nuclear Study: No previous nuclear study performed  Overall Impression:  Low risk stress nuclear study Distal anterior wall apical and septal ischemia.  Low risk scan only because of known LAD occlusion with collaterals.  LV Ejection Fraction: 49%.  LV Wall Motion:  Diffuse hypokinesis with mild reduction in EF  [employer]

## 2013-10-30 ENCOUNTER — Encounter: Payer: Self-pay | Admitting: Cardiology

## 2013-10-30 ENCOUNTER — Ambulatory Visit (INDEPENDENT_AMBULATORY_CARE_PROVIDER_SITE_OTHER): Payer: Medicare Other | Admitting: Cardiology

## 2013-10-30 VITALS — BP 166/84 | HR 89 | Ht 67.0 in | Wt 171.0 lb

## 2013-10-30 DIAGNOSIS — E785 Hyperlipidemia, unspecified: Secondary | ICD-10-CM | POA: Diagnosis not present

## 2013-10-30 DIAGNOSIS — I10 Essential (primary) hypertension: Secondary | ICD-10-CM | POA: Diagnosis not present

## 2013-10-30 DIAGNOSIS — I2510 Atherosclerotic heart disease of native coronary artery without angina pectoris: Secondary | ICD-10-CM

## 2013-10-30 NOTE — Patient Instructions (Signed)
Continue your current therapy  I will see you again in 6 months.   

## 2013-10-30 NOTE — Progress Notes (Signed)
[name] [name] Date of Birth: 1928-12-24 Medical Record #[id number]  History of Present Illness: [name] is seen back today for a follow up visit. He is status post NSTEMI  in March of 2014. Found to have a large abdominal mass at that time with non diagnostic pathology (possible complex hematoma). He was cathed and had an occluded LAD with excellent right to left collaterals. EF was 50%. LAD appeared suitable for PCI but given the possible abdominal hematoma, medical therapy was instituted. His other issues include HTN, HLD and anemia.   He socially underwent resection of his abdominal mass which was a stromal tumor. This was treated with surgery only. 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 He denies any symptoms of chest pain or shortness of breath. His blood pressure has been well-controlled at home. He notices no limitations now. He did have a Myoview study on medical therapy which showed only a small area of apical ischemia. This was felt to be at low risk and.  Current Outpatient Prescriptions  Medication Sig Dispense Refill  . amLODipine-benazepril (LOTREL) 10-20 MG per capsule Take 1 capsule by mouth daily.      [name] aspirin 81 MG tablet Take 81 mg by mouth daily.      . hydrochlorothiazide (HYDRODIURIL) 25 MG tablet Take 25 mg by mouth daily.      . metoprolol tartrate (LOPRESSOR) 25 MG tablet Take 1 tablet (25 mg total) by mouth 2 (two) times daily.  60 tablet  2  . simvastatin (ZOCOR) 20 MG tablet Take 20 mg by mouth every evening.       No current facility-administered medications for this visit.    No Known Allergies  Past Medical History  Diagnosis Date  . Hyperlipidemia     TAKES zOCOR DAILY  . Abdominal mass 02/2013  . UTI (urinary tract infection)   . Hypertension     TAKES LOTREL,HCTZ,AND METOPROLOL DAILY  . NSTEMI (non-ST elevated myocardial infarction) 02/2013    "LIGHT: ONE MD SAID HE DID AND ONE SAID HE DIDN'T  . Pneumonia     MAR  2014  . Stromal tumor of digestive system   . CAD (coronary artery disease)     Past Surgical History  Procedure Laterality Date  . Cyst excision  1962    wrist  . Cardiac catheterization  03-05-13  . Laparotomy N/A 06/04/2013    Procedure: EXPLORATORY LAPAROTOMY, OPEN RESECTION OF MESENTERIC AND INTESTINAL MASS;  Surgeon: Ernestene Mention, MD;  Location: [facility] OR;  Service: General;  Laterality: N/A;  Small Bowel Resection    History  Smoking status  . Never Smoker   Smokeless tobacco  . Never Used    History  Alcohol Use No    History reviewed. No pertinent family history.  Review of Systems: The review of systems is per the HPI.  All other systems were reviewed and are negative.  Physical Exam: BP 166/84  Pulse 89  Ht 5\' 7"  (1.702 m)  Wt 171 lb (77.565 kg)  BMI 26.78 kg/m2 Patient is very pleasant and in no acute distress. Skin is warm and dry. Color is normal.  HEENT is unremarkable. Normocephalic/atraumatic. PERRL. Sclera are nonicteric. Neck is supple. No masses. No JVD. Lungs are clear. Cardiac exam shows a regular rate and rhythm. Abdomen is soft. Extremities are without edema. Gait and ROM are intact. No gross neurologic deficits noted.  LABORATORY DATA:  Lab Results  Component Value Date  WBC 8.8 06/06/2013   HGB 9.3* 06/06/2013   HCT 28.1* 06/06/2013   PLT 204 06/06/2013   GLUCOSE 117* 06/06/2013   ALT 22 05/28/2013   AST 20 05/28/2013   NA 140 06/06/2013   K 3.9 06/06/2013   CL 106 06/06/2013   CREATININE 0.79 06/06/2013   BUN 9 06/06/2013   CO2 27 06/06/2013   INR 0.97 05/28/2013    Assessment / Plan: 1. CAD with NSTEMI back in March of 2014 - EF is 50% - has an occluded LAD with excellent right to left collaterals - he is Class I in regards to symptoms. He had a low risk Myoview study on medical therapy. At this point there is no indication for intervention. We will continue medical management.  2. Stromal tumor - followed by  Dr. Truett Perna.   3. HTN -  reports better BP control at home.

## 2013-10-31 ENCOUNTER — Ambulatory Visit (HOSPITAL_BASED_OUTPATIENT_CLINIC_OR_DEPARTMENT_OTHER): Payer: Medicare Other | Admitting: Oncology

## 2013-10-31 ENCOUNTER — Telehealth: Payer: Self-pay | Admitting: Oncology

## 2013-10-31 VITALS — BP 158/53 | HR 92 | Temp 98.3°F | Resp 18 | Ht 67.0 in | Wt 172.2 lb

## 2013-10-31 DIAGNOSIS — C179 Malignant neoplasm of small intestine, unspecified: Secondary | ICD-10-CM

## 2013-10-31 DIAGNOSIS — C49A3 Gastrointestinal stromal tumor of small intestine: Secondary | ICD-10-CM

## 2013-10-31 DIAGNOSIS — D649 Anemia, unspecified: Secondary | ICD-10-CM

## 2013-10-31 NOTE — Telephone Encounter (Signed)
Called pt and left message regarding appt for MD on May 2015

## 2013-10-31 NOTE — Progress Notes (Signed)
   [facility]    OFFICE PROGRESS NOTE   INTERVAL HISTORY:   He returns for a scheduled visit. No complaint. No abdominal pain. Good appetite. No difficulty with bowel function. He reports having a repeat CBC with Dr. Foy Guadalajara and the hemoglobin was adequate.  Objective:  Vital signs in last 24 hours:  Blood pressure 158/53, pulse 92, temperature 98.3 F (36.8 C), temperature source Oral, resp. rate 18, height 5\' 7"  (1.702 m), weight 172 lb 3.2 oz (78.109 kg), SpO2 98.00%.    HEENT: Neck without mass Lymphatics: No cervical, supraclavicular, axillary, or inguinal nodes Resp: Lungs clear bilaterally Cardio: Regular rate and rhythm GI: No hepatomegaly, nontender, no mass, no apparent ascites Vascular: No leg edema   Lab Results:  Lab Results  Component Value Date   WBC 8.8 06/06/2013   HGB 9.3* 06/06/2013   HCT 28.1* 06/06/2013   MCV 90.1 06/06/2013   PLT 204 06/06/2013      Medications: I have reviewed the patient's current medications.  Assessment/Plan: 1. 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 Anemia-? Related to bleeding from the small bowel tumor , he reports having a followup CBC with Dr. Foy Guadalajara 3. NSTEMI-March 2014, cardiac catheterization 03/05/2013 confirmed an LAD occlusion  4. Presyncope event 02/27/2013     Disposition:  Mr. Gruenhagen remains in clinical remission from the gastrointestinal stromal tumor. He declined adjuvant Gleevec therapy. He will return for an office visit in 4 months.   Thornton Papas, MD  10/31/2013  5:29 PM

## 2013-12-05 DIAGNOSIS — Z961 Presence of intraocular lens: Secondary | ICD-10-CM | POA: Diagnosis not present

## 2013-12-30 ENCOUNTER — Encounter (INDEPENDENT_AMBULATORY_CARE_PROVIDER_SITE_OTHER): Payer: Self-pay

## 2013-12-30 ENCOUNTER — Encounter (INDEPENDENT_AMBULATORY_CARE_PROVIDER_SITE_OTHER): Payer: Self-pay | Admitting: General Surgery

## 2013-12-30 ENCOUNTER — Ambulatory Visit (INDEPENDENT_AMBULATORY_CARE_PROVIDER_SITE_OTHER): Payer: Medicare Other | Admitting: General Surgery

## 2013-12-30 VITALS — BP 140/78 | HR 90 | Temp 97.3°F | Resp 20 | Ht 67.5 in | Wt 175.5 lb

## 2013-12-30 DIAGNOSIS — C179 Malignant neoplasm of small intestine, unspecified: Secondary | ICD-10-CM | POA: Diagnosis not present

## 2013-12-30 DIAGNOSIS — C49A3 Gastrointestinal stromal tumor of small intestine: Secondary | ICD-10-CM

## 2013-12-30 NOTE — Patient Instructions (Signed)
Your physical exam today is normal. There is no evidence of cancer.  Keep your appointment with Dr. [name] in May of this year. It is likely that he will order a CT scan of the abdomen and pelvis at that time  I recommend that you have a colonoscopy and possibly an upper endoscopy with Dr. Michail Sermon in in 2017  Return to see Dr. [name] in 6 months.

## 2013-12-30 NOTE — Progress Notes (Signed)
Patient ID: [name] [name], male   DOB: 29-May-1929, 80 y.o.   MRN: [id number] History:  This patient underwent a small bowel resection for a small bowel mass on 06/04/2013. Final pathology showed a gastrointestinal stromal tumor.Given the size of the tumor and the mitotic rate, it was assigned a 24% risk of being malignant. This area was completely resected with negative margins and there was no other disease within the abdomen.  He has recovered uneventfully. He occasionally has some right-sided abdominal pain when he exerts himself but no other time. This is very intermittent.. Normal appetite. Normal bowel movements. No wound problems. No fever. Weight stable. He was evaluated by Dr. Julieanne Manson. They discussed adjuvant Gleevec but together decided against that. He has also been evaluated and is followed by Dr. Peter Martinique because of his coronary artery disease. He states that he passed a stress test and did not need a stent. He will he doesn't have any chest pain or dyspnea on exertion. Last colonoscopy was in 2012 by Dr. Michail Sermon.   Past history, family history, social history, and risk review of systems are documented on the chart, unchanged, and noncontributory except as described above.  Exam:  Patient looks well. Wife is with him. Neck reveals no adenopathy or mass Lungs clear to auscultation bilaterally Heart regular rate and rhythm. No ectopy. No murmur.  Abdomen soft. Nontender. Midline incision healing uneventfully. No hernia. No organomegaly. No mass no inguinal adenopathy. Good femoral pulses.  Assessment:  GIST tumor of mid small bowel. 24% risk of being malignant by size and mitotic rate. Completely resected. Uneventful recovery Currently being treated by observation only. Coronary artery disease. followed by Dr. Peter Martinique.   Plan: Diet activities discussed  He was reassured that he appears to be doing well clinically. He has an appointment to see Dr. Julieanne Manson In May. I  think it would be reasonable to get a CT scan of the abdomen and pelvis this summer, at the one year mark I told him it would be reasonable to get another colonoscopy, and possibly upper endoscopy in 2017. Return to see me in 6 months.    [name]. [name], M.D., [facility]  General and Minimally invasive Surgery  Breast and Colorectal Surgery  Office: [phone]  Pager: [phone]

## 2014-01-21 DIAGNOSIS — E782 Mixed hyperlipidemia: Secondary | ICD-10-CM | POA: Diagnosis not present

## 2014-01-21 DIAGNOSIS — D62 Acute posthemorrhagic anemia: Secondary | ICD-10-CM | POA: Diagnosis not present

## 2014-01-21 DIAGNOSIS — I10 Essential (primary) hypertension: Secondary | ICD-10-CM | POA: Diagnosis not present

## 2014-01-21 DIAGNOSIS — D214 Benign neoplasm of connective and other soft tissue of abdomen: Secondary | ICD-10-CM | POA: Diagnosis not present

## 2014-04-09 DIAGNOSIS — K3189 Other diseases of stomach and duodenum: Secondary | ICD-10-CM | POA: Diagnosis not present

## 2014-04-09 DIAGNOSIS — R1013 Epigastric pain: Secondary | ICD-10-CM | POA: Diagnosis not present

## 2014-04-29 ENCOUNTER — Ambulatory Visit (HOSPITAL_BASED_OUTPATIENT_CLINIC_OR_DEPARTMENT_OTHER): Payer: Medicare Other | Admitting: Oncology

## 2014-04-29 ENCOUNTER — Telehealth: Payer: Self-pay | Admitting: Oncology

## 2014-04-29 VITALS — BP 168/56 | HR 90 | Temp 99.0°F | Resp 18 | Ht 67.5 in | Wt 172.6 lb

## 2014-04-29 DIAGNOSIS — C49A3 Gastrointestinal stromal tumor of small intestine: Secondary | ICD-10-CM

## 2014-04-29 DIAGNOSIS — C179 Malignant neoplasm of small intestine, unspecified: Secondary | ICD-10-CM

## 2014-04-29 NOTE — Progress Notes (Signed)
  [facility] OFFICE PROGRESS NOTE   Diagnosis: Gastrointestinal stromal tumor  INTERVAL HISTORY:   [name] [name] returns as scheduled. He feels well. Good appetite. No abdominal pain. No difficulty with bowel function.  Objective:  Vital signs in last 24 hours:  Blood pressure 168/56, pulse 90, temperature 99 F (37.2 C), temperature source Oral, resp. rate 18, height 5' 7.5" (1.715 m), weight 172 lb 9.6 oz (78.291 kg).    HEENT: Neck without mass Lymphatics: No cervical, supraclavicular, axillary, or inguinal nodes Resp: Lungs clear bilaterally Cardio: Regular rate and rhythm GI: No hepatosplenomegaly, nontender, no mass, no apparent ascites Vascular: No leg edema  Lab Results:  Lab Results  Component Value Date   WBC 8.8 06/06/2013   HGB 9.3* 06/06/2013   HCT 28.1* 06/06/2013   MCV 90.1 06/06/2013   PLT 204 06/06/2013   NEUTROABS 4.1 05/28/2013     Medications: I have reviewed the patient's current medications.  Assessment/Plan: 1. 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 Anemia-? Related to bleeding from the small bowel tumor , he reports repeat CBCs with Dr. Maceo Pro have been "okay " 3. NSTEMI-March 2014, cardiac catheterization 03/05/2013 confirmed an LAD occlusion  4. Presyncope event 02/27/2013     Disposition:  There is no clinical evidence for progression of the gastrointestinal stromal tumor. [name] [name] will return for an office visit in 6 months. He will see Dr. [name] in the interim.  Ladell Pier, MD  04/29/2014  3:28 PM

## 2014-04-29 NOTE — Telephone Encounter (Signed)
gv adn prnted appt sched and avs for pt for NOV °

## 2014-05-02 ENCOUNTER — Encounter: Payer: Self-pay | Admitting: Cardiology

## 2014-05-02 ENCOUNTER — Ambulatory Visit (INDEPENDENT_AMBULATORY_CARE_PROVIDER_SITE_OTHER): Payer: Medicare Other | Admitting: Cardiology

## 2014-05-02 VITALS — BP 158/68 | HR 83 | Ht 67.0 in | Wt 173.0 lb

## 2014-05-02 DIAGNOSIS — I10 Essential (primary) hypertension: Secondary | ICD-10-CM

## 2014-05-02 DIAGNOSIS — E785 Hyperlipidemia, unspecified: Secondary | ICD-10-CM

## 2014-05-02 DIAGNOSIS — I2510 Atherosclerotic heart disease of native coronary artery without angina pectoris: Secondary | ICD-10-CM | POA: Diagnosis not present

## 2014-05-02 NOTE — Progress Notes (Signed)
[name] [name] Date of Birth: [birth year] Medical Record #[id number]  History of Present Illness: Mr. Biel is seen back today for a follow up visit. He is status post NSTEMI  in March of 2014. Found to have a large abdominal mass at that time with non diagnostic pathology (possible complex hematoma). He was cathed and had an occluded LAD with excellent right to left collaterals. EF was 50%. LAD appeared suitable for PCI but given the possible abdominal hematoma, medical therapy was instituted. His other issues include HTN, HLD and anemia. Subsequent surgery revealed the abdominal mass to be a stromal tumor and it was resected.  He denies any symptoms of chest pain or shortness of breath. His blood pressure has been well-controlled at home. He notices no limitations now. He did have a Myoview study on medical therapy which showed only a small area of apical ischemia. This was felt to be at low risk.  Current Outpatient Prescriptions  Medication Sig Dispense Refill  . amLODipine (NORVASC) 10 MG tablet Take 10 mg by mouth daily.      [name] aspirin 81 MG tablet Take 81 mg by mouth daily.      . benazepril (LOTENSIN) 20 MG tablet Take 20 mg by mouth daily.      . hydrochlorothiazide (HYDRODIURIL) 25 MG tablet Take 25 mg by mouth daily.      . metoprolol tartrate (LOPRESSOR) 25 MG tablet Take 1 tablet (25 mg total) by mouth 2 (two) times daily.  60 tablet  2  . simvastatin (ZOCOR) 20 MG tablet Take 20 mg by mouth every evening.       No current facility-administered medications for this visit.    No Known Allergies  Past Medical History  Diagnosis Date  . Hyperlipidemia     TAKES zOCOR DAILY  . Abdominal mass 02/2013  . UTI (urinary tract infection)   . Hypertension     TAKES LOTREL,HCTZ,AND METOPROLOL DAILY  . NSTEMI (non-ST elevated myocardial infarction) 02/2013    "LIGHT: ONE MD SAID HE DID AND ONE SAID HE DIDN'T  . Pneumonia     MAR 2014  . Stromal tumor of digestive system   . CAD (coronary  artery disease)     Past Surgical History  Procedure Laterality Date  . Cyst excision  1962    wrist  . Cardiac catheterization  03-05-13  . Laparotomy N/A 06/04/2013    Procedure: EXPLORATORY LAPAROTOMY, OPEN RESECTION OF MESENTERIC AND INTESTINAL MASS;  Surgeon: [name], MD;  Location: [facility];  Service: General;  Laterality: N/A;  Small Bowel Resection    History  Smoking status  . Never Smoker   Smokeless tobacco  . Never Used    History  Alcohol Use No    History reviewed. No pertinent family history.  Review of Systems: The review of systems is per the HPI.  All other systems were reviewed and are negative.  Physical Exam: BP 158/68  Pulse 83  Ht 5\' 7"  (1.702 m)  Wt 173 lb (78.472 kg)  BMI 27.09 kg/m2 Patient is very pleasant and in no acute distress. Skin is warm and dry. Color is normal.  HEENT is unremarkable. Normocephalic/atraumatic. PERRL. Sclera are nonicteric. Neck is supple. No masses. No JVD. Lungs are clear. Cardiac exam shows a regular rate and rhythm. Abdomen is soft. Extremities are without edema. Gait and ROM are intact. No gross neurologic deficits noted.  LABORATORY DATA:  Lab Results  Component Value Date   WBC 8.8  06/06/2013   HGB 9.3* 06/06/2013   HCT 28.1* 06/06/2013   PLT 204 06/06/2013   GLUCOSE 117* 06/06/2013   ALT 22 05/28/2013   AST 20 05/28/2013   NA 140 06/06/2013   K 3.9 06/06/2013   CL 106 06/06/2013   CREATININE 0.79 06/06/2013   BUN 9 06/06/2013   CO2 27 06/06/2013   INR 0.97 05/28/2013    Assessment / Plan: 1. CAD with NSTEMI back in March of 2014 - EF is 50% - has an occluded LAD with excellent right to left collaterals - he is Class I in regards to symptoms. He had a low risk Myoview study on medical therapy. At this point there is no indication for intervention. We will continue medical management. I will follow up in one year.  2. Stromal tumor - followed by  Dr. [name].   3. HTN - reports better BP control at home.

## 2014-05-02 NOTE — Patient Instructions (Signed)
Continue your current therapy  I will see you again in one year.   

## 2014-06-30 DIAGNOSIS — H6120 Impacted cerumen, unspecified ear: Secondary | ICD-10-CM | POA: Diagnosis not present

## 2014-07-11 DIAGNOSIS — E782 Mixed hyperlipidemia: Secondary | ICD-10-CM | POA: Diagnosis not present

## 2014-07-11 DIAGNOSIS — D649 Anemia, unspecified: Secondary | ICD-10-CM | POA: Diagnosis not present

## 2014-07-11 DIAGNOSIS — R5383 Other fatigue: Secondary | ICD-10-CM | POA: Diagnosis not present

## 2014-07-11 DIAGNOSIS — R630 Anorexia: Secondary | ICD-10-CM | POA: Diagnosis not present

## 2014-07-11 DIAGNOSIS — R5381 Other malaise: Secondary | ICD-10-CM | POA: Diagnosis not present

## 2014-07-11 DIAGNOSIS — R822 Biliuria: Secondary | ICD-10-CM | POA: Diagnosis not present

## 2014-07-21 DIAGNOSIS — Z1211 Encounter for screening for malignant neoplasm of colon: Secondary | ICD-10-CM | POA: Diagnosis not present

## 2014-07-22 ENCOUNTER — Encounter (INDEPENDENT_AMBULATORY_CARE_PROVIDER_SITE_OTHER): Payer: Self-pay | Admitting: General Surgery

## 2014-07-22 ENCOUNTER — Ambulatory Visit (INDEPENDENT_AMBULATORY_CARE_PROVIDER_SITE_OTHER): Payer: Medicare Other | Admitting: General Surgery

## 2014-07-22 VITALS — BP 130/70 | HR 71 | Temp 97.0°F | Ht 67.0 in | Wt 170.0 lb

## 2014-07-22 DIAGNOSIS — C49A3 Gastrointestinal stromal tumor of small intestine: Secondary | ICD-10-CM

## 2014-07-22 DIAGNOSIS — C179 Malignant neoplasm of small intestine, unspecified: Secondary | ICD-10-CM

## 2014-07-22 NOTE — Patient Instructions (Signed)
Your physical exam is normal, and your weight remains stable.  You stated that your lab work at Dr. Delman Kitten office  has returned to normal and that is good news.  There is no evidence of cancer  Be sure to get a CT scan of your abdomen and pelvis in November of this year and see Dr. [name] at that time  Return to see Dr. [name] in one year  We will consider repeating her upper endoscopy and colonoscopy in 2 years

## 2014-07-22 NOTE — Progress Notes (Signed)
Patient ID: [name] [name], male   DOB: 1929-07-08, 80 y.o.   MRN: [id number]   History:  This patient underwent a small bowel resection for a small bowel mass on 06/04/2013. Final pathology showed a gastrointestinal stromal tumor.Given the size of the tumor and the mitotic rate, it was assigned a 24% risk of being malignant. This area was completely resected with negative margins and there was no other disease within the abdomen.  He has recovered uneventfully. He occasionally has some right-sided abdominal pain when he exerts himself but no other time. This is very intermittent.. Normal appetite. Normal bowel movements. No wound problems. No fever. Weight stable.  He was initially evaluated by Dr. Julieanne Manson. They discussed adjuvant Gleevec but together decided against that.  He has also been evaluated and is followed by Dr. Peter Martinique because of his coronary artery disease. He states that he passed a stress test and did not need a stent. He will he doesn't have any chest pain or dyspnea on exertion.  Last colonoscopy was in 2012 by Dr. Michail Sermon.  Recent lab work with Dr. Maceo Pro was reportedly normal. He last saw Dr. [name] in May of this year.  He plans CT scan in November of this year in followup at that time.  Past history, family history, social history, and risk review of systems are documented on the chart, unchanged, and noncontributory except as described above.   Exam:  Patient looks well. Weight 170  Neck reveals no adenopathy or mass  Lungs clear to auscultation bilaterally  Heart regular rate and rhythm. No ectopy. No murmur.  Abdomen soft. Nontender. Midline incision healing uneventfully. No hernia. No organomegaly. No mass no inguinal adenopathy. Good femoral pulses.   Assessment:  GIST tumor of mid small bowel. 24% risk of being malignant by size and mitotic rate. Completely resected. Uneventful recovery.  No clinical evidence of recurrent cancer. Currently being treated by  observation only.  Coronary artery disease. followed by Dr. Peter Martinique.   Plan: He was reassured that he appears to be doing well clinically.  He has an appointment to see Dr. Julieanne Manson In Nov.,  2015 Plan CT scan of abdomen and pelvis in November 2015  I told him it would be reasonable to get another colonoscopy, and possibly upper endoscopy in 2017.  Return to  see me in one year.    [name]. [name], M.D., [facility]  General and Minimally invasive Surgery  Breast and Colorectal Surgery  Office: [phone]  Pager: [phone]

## 2014-08-11 DIAGNOSIS — D500 Iron deficiency anemia secondary to blood loss (chronic): Secondary | ICD-10-CM | POA: Diagnosis not present

## 2014-08-11 DIAGNOSIS — D214 Benign neoplasm of connective and other soft tissue of abdomen: Secondary | ICD-10-CM | POA: Diagnosis not present

## 2014-08-11 DIAGNOSIS — I10 Essential (primary) hypertension: Secondary | ICD-10-CM | POA: Diagnosis not present

## 2014-08-11 DIAGNOSIS — D539 Nutritional anemia, unspecified: Secondary | ICD-10-CM | POA: Diagnosis not present

## 2014-08-11 DIAGNOSIS — L989 Disorder of the skin and subcutaneous tissue, unspecified: Secondary | ICD-10-CM | POA: Diagnosis not present

## 2014-08-11 DIAGNOSIS — E782 Mixed hyperlipidemia: Secondary | ICD-10-CM | POA: Diagnosis not present

## 2014-08-26 DIAGNOSIS — L570 Actinic keratosis: Secondary | ICD-10-CM | POA: Diagnosis not present

## 2014-09-01 DIAGNOSIS — D509 Iron deficiency anemia, unspecified: Secondary | ICD-10-CM | POA: Diagnosis not present

## 2014-09-08 DIAGNOSIS — K259 Gastric ulcer, unspecified as acute or chronic, without hemorrhage or perforation: Secondary | ICD-10-CM | POA: Diagnosis not present

## 2014-09-08 DIAGNOSIS — K5730 Diverticulosis of large intestine without perforation or abscess without bleeding: Secondary | ICD-10-CM | POA: Diagnosis not present

## 2014-09-08 DIAGNOSIS — D1330 Benign neoplasm of unspecified part of small intestine: Secondary | ICD-10-CM | POA: Diagnosis not present

## 2014-09-08 DIAGNOSIS — K648 Other hemorrhoids: Secondary | ICD-10-CM | POA: Diagnosis not present

## 2014-09-08 DIAGNOSIS — K633 Ulcer of intestine: Secondary | ICD-10-CM | POA: Diagnosis not present

## 2014-09-08 DIAGNOSIS — D126 Benign neoplasm of colon, unspecified: Secondary | ICD-10-CM | POA: Diagnosis not present

## 2014-09-08 DIAGNOSIS — K319 Disease of stomach and duodenum, unspecified: Secondary | ICD-10-CM | POA: Diagnosis not present

## 2014-09-08 DIAGNOSIS — D509 Iron deficiency anemia, unspecified: Secondary | ICD-10-CM | POA: Diagnosis not present

## 2014-09-08 DIAGNOSIS — R195 Other fecal abnormalities: Secondary | ICD-10-CM | POA: Diagnosis not present

## 2014-09-08 HISTORY — PX: COLONOSCOPY: SHX174

## 2014-09-09 DIAGNOSIS — K4090 Unilateral inguinal hernia, without obstruction or gangrene, not specified as recurrent: Secondary | ICD-10-CM | POA: Diagnosis not present

## 2014-09-09 DIAGNOSIS — K439 Ventral hernia without obstruction or gangrene: Secondary | ICD-10-CM | POA: Diagnosis not present

## 2014-09-09 DIAGNOSIS — D509 Iron deficiency anemia, unspecified: Secondary | ICD-10-CM | POA: Diagnosis not present

## 2014-09-09 DIAGNOSIS — R3911 Hesitancy of micturition: Secondary | ICD-10-CM | POA: Diagnosis not present

## 2014-09-09 DIAGNOSIS — R609 Edema, unspecified: Secondary | ICD-10-CM | POA: Diagnosis not present

## 2014-09-12 ENCOUNTER — Inpatient Hospital Stay (HOSPITAL_COMMUNITY)
Admission: EM | Admit: 2014-09-12 | Discharge: 2014-09-20 | DRG: 291 | Disposition: A | Discharge status: Home Health | Payer: Medicare Other | Attending: Internal Medicine | Admitting: Internal Medicine

## 2014-09-12 ENCOUNTER — Encounter (HOSPITAL_COMMUNITY): Payer: Self-pay | Admitting: Emergency Medicine

## 2014-09-12 ENCOUNTER — Emergency Department (HOSPITAL_COMMUNITY): Payer: Medicare Other

## 2014-09-12 DIAGNOSIS — Z9049 Acquired absence of other specified parts of digestive tract: Secondary | ICD-10-CM | POA: Diagnosis present

## 2014-09-12 DIAGNOSIS — R7989 Other specified abnormal findings of blood chemistry: Secondary | ICD-10-CM

## 2014-09-12 DIAGNOSIS — I214 Non-ST elevation (NSTEMI) myocardial infarction: Secondary | ICD-10-CM | POA: Diagnosis not present

## 2014-09-12 DIAGNOSIS — K661 Hemoperitoneum: Secondary | ICD-10-CM | POA: Diagnosis present

## 2014-09-12 DIAGNOSIS — E785 Hyperlipidemia, unspecified: Secondary | ICD-10-CM | POA: Diagnosis present

## 2014-09-12 DIAGNOSIS — D6489 Other specified anemias: Secondary | ICD-10-CM

## 2014-09-12 DIAGNOSIS — J449 Chronic obstructive pulmonary disease, unspecified: Secondary | ICD-10-CM | POA: Diagnosis present

## 2014-09-12 DIAGNOSIS — R109 Unspecified abdominal pain: Secondary | ICD-10-CM | POA: Diagnosis not present

## 2014-09-12 DIAGNOSIS — J810 Acute pulmonary edema: Secondary | ICD-10-CM

## 2014-09-12 DIAGNOSIS — Z87440 Personal history of urinary (tract) infections: Secondary | ICD-10-CM

## 2014-09-12 DIAGNOSIS — I9589 Other hypotension: Secondary | ICD-10-CM

## 2014-09-12 DIAGNOSIS — R7309 Other abnormal glucose: Secondary | ICD-10-CM | POA: Diagnosis not present

## 2014-09-12 DIAGNOSIS — I2589 Other forms of chronic ischemic heart disease: Secondary | ICD-10-CM | POA: Diagnosis not present

## 2014-09-12 DIAGNOSIS — R0602 Shortness of breath: Secondary | ICD-10-CM | POA: Diagnosis not present

## 2014-09-12 DIAGNOSIS — I2582 Chronic total occlusion of coronary artery: Secondary | ICD-10-CM | POA: Diagnosis present

## 2014-09-12 DIAGNOSIS — D68318 Other hemorrhagic disorder due to intrinsic circulating anticoagulants, antibodies, or inhibitors: Secondary | ICD-10-CM | POA: Diagnosis not present

## 2014-09-12 DIAGNOSIS — N179 Acute kidney failure, unspecified: Secondary | ICD-10-CM | POA: Diagnosis present

## 2014-09-12 DIAGNOSIS — D62 Acute posthemorrhagic anemia: Secondary | ICD-10-CM | POA: Diagnosis present

## 2014-09-12 DIAGNOSIS — R578 Other shock: Secondary | ICD-10-CM | POA: Diagnosis present

## 2014-09-12 DIAGNOSIS — J90 Pleural effusion, not elsewhere classified: Secondary | ICD-10-CM | POA: Diagnosis not present

## 2014-09-12 DIAGNOSIS — R710 Precipitous drop in hematocrit: Secondary | ICD-10-CM | POA: Diagnosis not present

## 2014-09-12 DIAGNOSIS — J9600 Acute respiratory failure, unspecified whether with hypoxia or hypercapnia: Secondary | ICD-10-CM | POA: Diagnosis present

## 2014-09-12 DIAGNOSIS — R918 Other nonspecific abnormal finding of lung field: Secondary | ICD-10-CM | POA: Diagnosis not present

## 2014-09-12 DIAGNOSIS — J9601 Acute respiratory failure with hypoxia: Secondary | ICD-10-CM | POA: Diagnosis present

## 2014-09-12 DIAGNOSIS — I2510 Atherosclerotic heart disease of native coronary artery without angina pectoris: Secondary | ICD-10-CM | POA: Diagnosis not present

## 2014-09-12 DIAGNOSIS — Z8701 Personal history of pneumonia (recurrent): Secondary | ICD-10-CM

## 2014-09-12 DIAGNOSIS — J189 Pneumonia, unspecified organism: Secondary | ICD-10-CM | POA: Diagnosis present

## 2014-09-12 DIAGNOSIS — J9819 Other pulmonary collapse: Secondary | ICD-10-CM | POA: Diagnosis not present

## 2014-09-12 DIAGNOSIS — R778 Other specified abnormalities of plasma proteins: Secondary | ICD-10-CM

## 2014-09-12 DIAGNOSIS — R61 Generalized hyperhidrosis: Secondary | ICD-10-CM | POA: Diagnosis not present

## 2014-09-12 DIAGNOSIS — G9340 Encephalopathy, unspecified: Secondary | ICD-10-CM | POA: Diagnosis present

## 2014-09-12 DIAGNOSIS — R0902 Hypoxemia: Secondary | ICD-10-CM | POA: Diagnosis not present

## 2014-09-12 DIAGNOSIS — I248 Other forms of acute ischemic heart disease: Secondary | ICD-10-CM | POA: Diagnosis present

## 2014-09-12 DIAGNOSIS — Z6826 Body mass index (BMI) 26.0-26.9, adult: Secondary | ICD-10-CM

## 2014-09-12 DIAGNOSIS — I5033 Acute on chronic diastolic (congestive) heart failure: Secondary | ICD-10-CM | POA: Diagnosis present

## 2014-09-12 DIAGNOSIS — Z4682 Encounter for fitting and adjustment of non-vascular catheter: Secondary | ICD-10-CM | POA: Diagnosis not present

## 2014-09-12 DIAGNOSIS — R141 Gas pain: Secondary | ICD-10-CM | POA: Diagnosis not present

## 2014-09-12 DIAGNOSIS — I10 Essential (primary) hypertension: Secondary | ICD-10-CM | POA: Diagnosis present

## 2014-09-12 DIAGNOSIS — I509 Heart failure, unspecified: Secondary | ICD-10-CM | POA: Diagnosis not present

## 2014-09-12 DIAGNOSIS — N170 Acute kidney failure with tubular necrosis: Secondary | ICD-10-CM | POA: Diagnosis not present

## 2014-09-12 DIAGNOSIS — E861 Hypovolemia: Secondary | ICD-10-CM | POA: Diagnosis present

## 2014-09-12 DIAGNOSIS — I369 Nonrheumatic tricuspid valve disorder, unspecified: Secondary | ICD-10-CM | POA: Diagnosis not present

## 2014-09-12 DIAGNOSIS — R739 Hyperglycemia, unspecified: Secondary | ICD-10-CM | POA: Diagnosis not present

## 2014-09-12 DIAGNOSIS — J811 Chronic pulmonary edema: Secondary | ICD-10-CM | POA: Diagnosis not present

## 2014-09-12 DIAGNOSIS — K4090 Unilateral inguinal hernia, without obstruction or gangrene, not specified as recurrent: Secondary | ICD-10-CM | POA: Diagnosis not present

## 2014-09-12 HISTORY — DX: Shortness of breath: R06.02

## 2014-09-12 HISTORY — DX: Acute respiratory failure with hypoxia: J96.01

## 2014-09-12 LAB — CBC WITH DIFFERENTIAL/PLATELET
BASOS ABS: 0 10*3/uL (ref 0.0–0.1)
Basophils Absolute: 0 10*3/uL (ref 0.0–0.1)
Basophils Relative: 0 % (ref 0–1)
Basophils Relative: 0 % (ref 0–1)
EOS PCT: 2 % (ref 0–5)
Eosinophils Absolute: 0.2 10*3/uL (ref 0.0–0.7)
Eosinophils Absolute: 0 10*3/uL (ref 0.0–0.7)
Eosinophils Relative: 0 % (ref 0–5)
HCT: 31.9 % — ABNORMAL LOW (ref 39.0–52.0)
HEMATOCRIT: 27.2 % — AB (ref 39.0–52.0)
Hemoglobin: 10.1 g/dL — ABNORMAL LOW (ref 13.0–17.0)
Hemoglobin: 8.8 g/dL — ABNORMAL LOW (ref 13.0–17.0)
LYMPHS ABS: 3.3 10*3/uL (ref 0.7–4.0)
LYMPHS PCT: 25 % (ref 12–46)
LYMPHS PCT: 2 % — AB (ref 12–46)
Lymphs Abs: 0.2 10*3/uL — ABNORMAL LOW (ref 0.7–4.0)
MCH: 33.2 pg (ref 26.0–34.0)
MCH: 32.4 pg (ref 26.0–34.0)
MCHC: 31.7 g/dL (ref 30.0–36.0)
MCHC: 32.4 g/dL (ref 30.0–36.0)
MCV: 104.9 fL — AB (ref 78.0–100.0)
MCV: 100 fL (ref 78.0–100.0)
MONO ABS: 0.1 10*3/uL (ref 0.1–1.0)
Monocytes Absolute: 1.3 10*3/uL — ABNORMAL HIGH (ref 0.1–1.0)
Monocytes Relative: 10 % (ref 3–12)
Monocytes Relative: 1 % — ABNORMAL LOW (ref 3–12)
NEUTROS ABS: 7.3 10*3/uL (ref 1.7–7.7)
NEUTROS PCT: 63 % (ref 43–77)
Neutro Abs: 8.2 10*3/uL — ABNORMAL HIGH (ref 1.7–7.7)
Neutrophils Relative %: 97 % — ABNORMAL HIGH (ref 43–77)
PLATELETS: 406 10*3/uL — AB (ref 150–400)
Platelets: 278 10*3/uL (ref 150–400)
RBC: 3.04 MIL/uL — ABNORMAL LOW (ref 4.22–5.81)
RBC: 2.72 MIL/uL — ABNORMAL LOW (ref 4.22–5.81)
RDW: 15.8 % — ABNORMAL HIGH (ref 11.5–15.5)
RDW: 15.6 % — ABNORMAL HIGH (ref 11.5–15.5)
WBC: 13 10*3/uL — ABNORMAL HIGH (ref 4.0–10.5)
WBC: 7.6 10*3/uL (ref 4.0–10.5)

## 2014-09-12 LAB — I-STAT ARTERIAL BLOOD GAS, ED
Acid-base deficit: 1 mmol/L (ref 0.0–2.0)
BICARBONATE: 26.1 meq/L — AB (ref 20.0–24.0)
BICARBONATE: 25.1 meq/L — AB (ref 20.0–24.0)
O2 Saturation: 100 %
O2 Saturation: 94 %
PCO2 ART: 51.7 mmHg — AB (ref 35.0–45.0)
PO2 ART: 76 mmHg — AB (ref 80.0–100.0)
Patient temperature: 98.6
TCO2: 28 mmol/L (ref 0–100)
TCO2: 26 mmol/L (ref 0–100)
pCO2 arterial: 45.7 mmHg — ABNORMAL HIGH (ref 35.0–45.0)
pH, Arterial: 7.312 — ABNORMAL LOW (ref 7.350–7.450)
pH, Arterial: 7.348 — ABNORMAL LOW (ref 7.350–7.450)
pO2, Arterial: 273 mmHg — ABNORMAL HIGH (ref 80.0–100.0)

## 2014-09-12 LAB — I-STAT TROPONIN, ED: Troponin i, poc: 0.07 ng/mL (ref 0.00–0.08)

## 2014-09-12 LAB — COMPREHENSIVE METABOLIC PANEL
ALK PHOS: 138 U/L — AB (ref 39–117)
ALT: 22 U/L (ref 0–53)
AST: 40 U/L — ABNORMAL HIGH (ref 0–37)
Albumin: 3.5 g/dL (ref 3.5–5.2)
Anion gap: 17 — ABNORMAL HIGH (ref 5–15)
BILIRUBIN TOTAL: 1.3 mg/dL — AB (ref 0.3–1.2)
BUN: 32 mg/dL — ABNORMAL HIGH (ref 6–23)
CHLORIDE: 106 meq/L (ref 96–112)
CO2: 24 mEq/L (ref 19–32)
Calcium: 8.8 mg/dL (ref 8.4–10.5)
Creatinine, Ser: 1.3 mg/dL (ref 0.50–1.35)
GFR calc Af Amer: 57 mL/min — ABNORMAL LOW (ref >90)
GFR, EST NON AFRICAN AMERICAN: 49 mL/min — AB (ref >90)
GLUCOSE: 156 mg/dL — AB (ref 70–99)
POTASSIUM: 4.4 meq/L (ref 3.7–5.3)
SODIUM: 147 meq/L (ref 137–147)
Total Protein: 7.5 g/dL (ref 6.0–8.3)

## 2014-09-12 LAB — I-STAT CG4 LACTIC ACID, ED: Lactic Acid, Venous: 2.69 mmol/L — ABNORMAL HIGH (ref 0.5–2.2)

## 2014-09-12 LAB — TROPONIN I
Troponin I: 2.16 ng/mL (ref <0.30)
Troponin I: 3.74 ng/mL (ref <0.30)
Troponin I: 3.11 ng/mL (ref <0.30)

## 2014-09-12 LAB — URINALYSIS, ROUTINE W REFLEX MICROSCOPIC
GLUCOSE, UA: NEGATIVE mg/dL
Hgb urine dipstick: NEGATIVE
KETONES UR: 15 mg/dL — AB
LEUKOCYTES UA: NEGATIVE
Nitrite: NEGATIVE
PH: 5 (ref 5.0–8.0)
Protein, ur: 30 mg/dL — AB
SPECIFIC GRAVITY, URINE: 1.027 (ref 1.005–1.030)
Urobilinogen, UA: 1 mg/dL (ref 0.0–1.0)

## 2014-09-12 LAB — URINE MICROSCOPIC-ADD ON

## 2014-09-12 LAB — LACTIC ACID, PLASMA
Lactic Acid, Venous: 1.1 mmol/L (ref 0.5–2.2)
Lactic Acid, Venous: 1.3 mmol/L (ref 0.5–2.2)
Lactic Acid, Venous: 1.1 mmol/L (ref 0.5–2.2)

## 2014-09-12 LAB — TRIGLYCERIDES: TRIGLYCERIDES: 102 mg/dL (ref <150)

## 2014-09-12 LAB — HEPARIN LEVEL (UNFRACTIONATED): Heparin Unfractionated: 0.33 IU/mL (ref 0.30–0.70)

## 2014-09-12 LAB — PRO B NATRIURETIC PEPTIDE: Pro B Natriuretic peptide (BNP): 5726 pg/mL — ABNORMAL HIGH (ref 0–450)

## 2014-09-12 LAB — PROTIME-INR
INR: 1.11 (ref 0.00–1.49)
Prothrombin Time: 14.3 seconds (ref 11.6–15.2)

## 2014-09-12 LAB — MRSA PCR SCREENING: MRSA by PCR: NEGATIVE

## 2014-09-12 MED ORDER — PRO-STAT SUGAR FREE PO LIQD
30.0000 mL | Freq: Every day | ORAL | Status: AC
  Administered 2014-09-12: 30 mL via ORAL
  Filled 2014-09-12: qty 30

## 2014-09-12 MED ORDER — PROPOFOL 10 MG/ML IV EMUL
INTRAVENOUS | Status: AC
  Filled 2014-09-12: qty 100

## 2014-09-12 MED ORDER — HEPARIN BOLUS VIA INFUSION
4000.0000 [IU] | Freq: Once | INTRAVENOUS | Status: AC
  Administered 2014-09-12: 4000 [IU] via INTRAVENOUS
  Filled 2014-09-12: qty 4000

## 2014-09-12 MED ORDER — CETYLPYRIDINIUM CHLORIDE 0.05 % MT LIQD
7.0000 mL | Freq: Four times a day (QID) | OROMUCOSAL | Status: DC
  Administered 2014-09-12 – 2014-09-15 (×13): 7 mL via OROMUCOSAL

## 2014-09-12 MED ORDER — FUROSEMIDE 10 MG/ML IJ SOLN
80.0000 mg | Freq: Two times a day (BID) | INTRAMUSCULAR | Status: DC
  Administered 2014-09-12: 80 mg via INTRAVENOUS
  Filled 2014-09-12 (×3): qty 8

## 2014-09-12 MED ORDER — LIDOCAINE HCL (CARDIAC) 20 MG/ML IV SOLN
INTRAVENOUS | Status: AC
  Filled 2014-09-12: qty 5

## 2014-09-12 MED ORDER — ASPIRIN 325 MG PO TABS
325.0000 mg | ORAL_TABLET | Freq: Every day | ORAL | Status: DC
  Administered 2014-09-13: 325 mg via ORAL
  Filled 2014-09-12 (×2): qty 1

## 2014-09-12 MED ORDER — PNEUMOCOCCAL VAC POLYVALENT 25 MCG/0.5ML IJ INJ
0.5000 mL | INJECTION | INTRAMUSCULAR | Status: AC
  Administered 2014-09-13: 0.5 mL via INTRAMUSCULAR
  Filled 2014-09-12: qty 0.5

## 2014-09-12 MED ORDER — ETOMIDATE 2 MG/ML IV SOLN
INTRAVENOUS | Status: AC
  Administered 2014-09-12: 20 mg via INTRAVENOUS
  Filled 2014-09-12: qty 20

## 2014-09-12 MED ORDER — SODIUM CHLORIDE 0.9 % IV SOLN
50.0000 ug/h | INTRAVENOUS | Status: DC
  Administered 2014-09-12 – 2014-09-14 (×2): 50 ug/h via INTRAVENOUS
  Filled 2014-09-12 (×2): qty 50

## 2014-09-12 MED ORDER — PROPOFOL 10 MG/ML IV EMUL
5.0000 ug/kg/min | INTRAVENOUS | Status: DC
  Administered 2014-09-12: 30 ug/kg/min via INTRAVENOUS

## 2014-09-12 MED ORDER — POTASSIUM CHLORIDE ER 10 MEQ PO TBCR
20.0000 meq | EXTENDED_RELEASE_TABLET | Freq: Two times a day (BID) | ORAL | Status: DC
  Administered 2014-09-12 (×2): 20 meq via ORAL
  Filled 2014-09-12 (×5): qty 2

## 2014-09-12 MED ORDER — SODIUM CHLORIDE 0.9 % IJ SOLN: 3.0000 mL | INTRAMUSCULAR | Status: DC | PRN

## 2014-09-12 MED ORDER — SIMVASTATIN 20 MG PO TABS
20.0000 mg | ORAL_TABLET | Freq: Every evening | ORAL | Status: DC
  Administered 2014-09-12 – 2014-09-19 (×7): 20 mg
  Filled 2014-09-12 (×10): qty 1

## 2014-09-12 MED ORDER — HEPARIN SODIUM (PORCINE) 5000 UNIT/ML IJ SOLN: 5000.0000 [IU] | Freq: Three times a day (TID) | INTRAMUSCULAR | Status: DC

## 2014-09-12 MED ORDER — ASPIRIN 81 MG PO CHEW
324.0000 mg | CHEWABLE_TABLET | ORAL | Status: DC
  Administered 2014-09-12: 324 mg via ORAL
  Filled 2014-09-12: qty 4

## 2014-09-12 MED ORDER — SODIUM CHLORIDE 0.9 % IJ SOLN
3.0000 mL | Freq: Two times a day (BID) | INTRAMUSCULAR | Status: DC
  Administered 2014-09-12 (×2): 3 mL via INTRAVENOUS

## 2014-09-12 MED ORDER — CHLORHEXIDINE GLUCONATE 0.12 % MT SOLN
15.0000 mL | Freq: Two times a day (BID) | OROMUCOSAL | Status: DC
  Administered 2014-09-12 – 2014-09-15 (×8): 15 mL via OROMUCOSAL
  Filled 2014-09-12 (×7): qty 15

## 2014-09-12 MED ORDER — FUROSEMIDE 10 MG/ML IJ SOLN
40.0000 mg | Freq: Once | INTRAMUSCULAR | Status: AC
  Administered 2014-09-12: 40 mg via INTRAVENOUS
  Filled 2014-09-12: qty 4

## 2014-09-12 MED ORDER — ASPIRIN 81 MG PO TABS: 81.0000 mg | ORAL_TABLET | Freq: Every day | ORAL | Status: DC

## 2014-09-12 MED ORDER — SUCCINYLCHOLINE CHLORIDE 20 MG/ML IJ SOLN
INTRAMUSCULAR | Status: AC
  Administered 2014-09-12: 100 mg via INTRAVENOUS
  Filled 2014-09-12: qty 1

## 2014-09-12 MED ORDER — VITAL AF 1.2 CAL PO LIQD
1000.0000 mL | ORAL | Status: DC
  Administered 2014-09-12: 17:00:00
  Filled 2014-09-12 (×3): qty 1000

## 2014-09-12 MED ORDER — ASPIRIN 300 MG RE SUPP: 300.0000 mg | RECTAL | Status: DC

## 2014-09-12 MED ORDER — ASPIRIN 81 MG PO CHEW: 81.0000 mg | CHEWABLE_TABLET | Freq: Every day | ORAL | Status: DC

## 2014-09-12 MED ORDER — INFLUENZA VAC SPLIT QUAD 0.5 ML IM SUSY
0.5000 mL | PREFILLED_SYRINGE | INTRAMUSCULAR | Status: AC
  Administered 2014-09-13: 0.5 mL via INTRAMUSCULAR
  Filled 2014-09-12: qty 0.5

## 2014-09-12 MED ORDER — HEPARIN (PORCINE) IN NACL 100-0.45 UNIT/ML-% IJ SOLN
950.0000 [IU]/h | INTRAMUSCULAR | Status: DC
  Administered 2014-09-12: 950 [IU]/h via INTRAVENOUS
  Filled 2014-09-12: qty 250

## 2014-09-12 MED ORDER — SODIUM CHLORIDE 0.9 % IV SOLN: 250.0000 mL | INTRAVENOUS | Status: DC | PRN

## 2014-09-12 MED ORDER — PANTOPRAZOLE SODIUM 40 MG IV SOLR
40.0000 mg | Freq: Every day | INTRAVENOUS | Status: DC
  Administered 2014-09-12 – 2014-09-15 (×4): 40 mg via INTRAVENOUS
  Filled 2014-09-12 (×4): qty 40

## 2014-09-12 MED ORDER — FENTANYL BOLUS VIA INFUSION
25.0000 ug | INTRAVENOUS | Status: DC | PRN
  Filled 2014-09-12: qty 50

## 2014-09-12 MED ORDER — ROCURONIUM BROMIDE 50 MG/5ML IV SOLN
INTRAVENOUS | Status: AC
  Filled 2014-09-12: qty 2

## 2014-09-12 NOTE — Consult Note (Addendum)
Primary Cardiologist: Martinique Reason for Consultation: NSTEMI, respiratory failure   HPI:  [name] is an 80 y/o male with  H/o CAD s/p NSTEMI in March of 2014. Found to have a large abdominal mass at that time with non diagnostic pathology (possible complex hematoma).  He was cathed and had an occluded LAD with excellent right to left collaterals. EF was 50%. LAD appeared suitable for PCI but given the possible abdominal hematoma,medical therapy was instituted. His other issues include HTN, HLD and anemia. 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   In 8/14: Myoview Low risk stress nuclear study Distal anterior wall apical and septal ischemia. Low risk scan only because of known LAD occlusion with collaterals. LV Ejection Fraction: 49%. LV Wall Motion: Diffuse hypokinesis with mild reduction in EF  His wife states that he otherwise been well and had undergone a colonoscopy and endoscopy earlier in the week which was unremarkable. Over past few days he has had some new peripheral edema. Today developed acute respiratory distress. Brought to ER.  On arrival the patient was alert on BiPAP. He had gotten Solu-Medrol, magnesium, DuoNeb in route. He denied any chest pain. His oxygen saturation was in the 50s and he was intubated emergently. CXR with CHF. ECG diffuse ST depression with ST elevation in AVR. Trop 2.1  Echo today: reviewed personally. EF 50% anteroseptal HK. RV ok. RVSP 70.    Cath 03/05/13: Left mainstem: The left main coronary is short with 30% stenosis.  Left anterior descending (LAD): The LAD is a large vessel that is occluded after the takeoff of a large first diagonal branch. There are excellent right to left collaterals. The first diagonal is without significant disease.  Left circumflex (LCx): The left circumflex coronary gives rise to 2 marginal branches before terminating in the AV groove. It has mild irregularities less  than 20% proximally.  Right coronary artery (RCA): The right coronary is a large dominant vessel. It has 30-40% disease in the mid vessel. It gives excellent collaterals through septal perforators to the LAD.  Left ventriculography: Left ventricular systolic function is overall well preserved. Ejection fraction is estimated at 50%. There is mild to moderate hypokinesis of the anterior wall. There is no significant mitral insufficiency.    Review of Systems:     Cardiac Review of Systems: {Y] = yes [ ]  = no  Chest Pain [    ]  Resting SOB [ y  ] Exertional SOB  [  ]  Orthopnea [  ]   Pedal Edema Blue.Reese   ]    Palpitations [  ] Syncope  [  ]   Presyncope [   ]  General Review of Systems: [Y] = yes [  ]=no Constitional: recent weight change [  ]; anorexia [  ]; fatigue [  ]; nausea [  ]; night sweats [  ]; fever [  ]; or chills [  ];                                                                      Eyes : blurred vision [  ]; diplopia [   ]; vision changes [  ];  Amaurosis fugax[  ]; Resp: cough [ y ];  wheezing[ y ];  hemoptysis[  ];  PND [  ];  GI:  gallstones[  ], vomiting[  ];  dysphagia[  ]; melena[  ];  hematochezia [  ]; heartburn[  ];   GU: kidney stones [  ]; hematuria[  ];   dysuria [  ];  nocturia[  ]; incontinence [  ];             Skin: rash, swelling[  ];, hair loss[  ];  peripheral edema[  ];  or itching[  ]; Musculosketetal: myalgias[  ];  joint swelling[  ];  joint erythema[  ];  joint pain[ y ];  back pain[  ];  Heme/Lymph: bruising[  ];  bleeding[  ];  anemia[  ];  Neuro: TIA[  ];  headaches[  ];  stroke[  ];  vertigo[  ];  seizures[  ];   paresthesias[  ];  difficulty walking[  ];  Psych:depression[  ]; anxiety[  ];  Endocrine: diabetes[  ];  thyroid dysfunction[  ];  Other:  Past Medical History  Diagnosis Date  . Hyperlipidemia     TAKES zOCOR DAILY  . Abdominal mass 02/2013  . UTI (urinary tract infection)   . Hypertension     TAKES LOTREL,HCTZ,AND METOPROLOL DAILY    . NSTEMI (non-ST elevated myocardial infarction) 02/2013    "LIGHT: ONE MD SAID HE DID AND ONE SAID HE DIDN'T  . Pneumonia     MAR 2014  . Stromal tumor of digestive system   . CAD (coronary artery disease)     Medications Prior to Admission  Medication Sig Dispense Refill  . amLODipine (NORVASC) 10 MG tablet Take 10 mg by mouth daily.      [name] aspirin 81 MG tablet Take 81 mg by mouth daily.      . benazepril (LOTENSIN) 20 MG tablet Take 20 mg by mouth daily.      . hydrochlorothiazide (HYDRODIURIL) 25 MG tablet Take 25 mg by mouth daily.      . metoprolol tartrate (LOPRESSOR) 25 MG tablet Take 1 tablet (25 mg total) by mouth 2 (two) times daily.  60 tablet  2  . simvastatin (ZOCOR) 20 MG tablet Take 20 mg by mouth every evening.         [name] antiseptic oral rinse  7 mL Mouth Rinse QID  . aspirin  325 mg Oral Daily  . chlorhexidine  15 mL Mouth Rinse BID  . heparin  4,000 Units Intravenous Once  . lidocaine (cardiac) 100 mg/84ml      . pantoprazole (PROTONIX) IV  40 mg Intravenous QHS  . rocuronium      . simvastatin  20 mg Per Tube QPM  . sodium chloride  3 mL Intravenous Q12H    Infusions: . fentaNYL infusion INTRAVENOUS 50 mcg/hr (09/12/14 0549)  . heparin    . propofol Stopped (09/12/14 1006)    No Known Allergies  History   Social History  . Marital Status: Married    Spouse Name: [name]    Number of Children: 2  . Years of Education: N/A   Occupational History  . Retired     Writer   Social History Main Topics  . Smoking status: Never Smoker   . Smokeless tobacco: Never Used  . Alcohol Use: No  . Drug Use: No  . Sexual Activity: No   Other Topics Concern  . Not on file  Social History Narrative   Married, wife [name]   Retired Environmental manager for 41 years   Independent in [place]   #2 grown sons    History reviewed. No pertinent family history.  PHYSICAL EXAM: Filed Vitals:   09/12/14 1214  BP:   Pulse:   Temp: 98.8 F  (37.1 C)  Resp:      Intake/Output Summary (Last 24 hours) at 09/12/14 1305 Last data filed at 09/12/14 1006  Gross per 24 hour  Intake  93.72 ml  Output    126 ml  Net -32.28 ml    General:  Awake on vent HEENT: normal x for ETT Neck: supple. JVP to jaw Carotids 2+ bilat; no bruits. No lymphadenopathy or thryomegaly appreciated. Cor: PMI nondisplaced. Regular rate & rhythm. No rubs, gallops or murmurs. Lungs: + crackles Abdomen: soft, nontender, ++ distended. No hepatosplenomegaly. No bruits or masses. Good bowel sounds. Extremities: no cyanosis, clubbing, rash, 2+ edema Neuro: alert. Follows commands, cranial nerves grossly intact. moves all 4 extremities w/o difficulty. Affect pleasant.  ECG: as per HPI  Results for orders placed during the hospital encounter of 09/12/14 (from the past 24 hour(s))  CBC WITH DIFFERENTIAL     Status: Abnormal   Collection Time    09/12/14  4:45 AM      Result Value Ref Range   WBC 13.0 (*) 4.0 - 10.5 K/uL   RBC 3.04 (*) 4.22 - 5.81 MIL/uL   Hemoglobin 10.1 (*) 13.0 - 17.0 g/dL   HCT 31.9 (*) 39.0 - 52.0 %   MCV 104.9 (*) 78.0 - 100.0 fL   MCH 33.2  26.0 - 34.0 pg   MCHC 31.7  30.0 - 36.0 g/dL   RDW 15.8 (*) 11.5 - 15.5 %   Platelets 406 (*) 150 - 400 K/uL   Neutrophils Relative % 63  43 - 77 %   Neutro Abs 8.2 (*) 1.7 - 7.7 K/uL   Lymphocytes Relative 25  12 - 46 %   Lymphs Abs 3.3  0.7 - 4.0 K/uL   Monocytes Relative 10  3 - 12 %   Monocytes Absolute 1.3 (*) 0.1 - 1.0 K/uL   Eosinophils Relative 2  0 - 5 %   Eosinophils Absolute 0.2  0.0 - 0.7 K/uL   Basophils Relative 0  0 - 1 %   Basophils Absolute 0.0  0.0 - 0.1 K/uL  COMPREHENSIVE METABOLIC PANEL     Status: Abnormal   Collection Time    09/12/14  4:45 AM      Result Value Ref Range   Sodium 147  137 - 147 mEq/L   Potassium 4.4  3.7 - 5.3 mEq/L   Chloride 106  96 - 112 mEq/L   CO2 24  19 - 32 mEq/L   Glucose, Bld 156 (*) 70 - 99 mg/dL   BUN 32 (*) 6 - 23 mg/dL    Creatinine, Ser 1.30  0.50 - 1.35 mg/dL   Calcium 8.8  8.4 - 10.5 mg/dL   Total Protein 7.5  6.0 - 8.3 g/dL   Albumin 3.5  3.5 - 5.2 g/dL   AST 40 (*) 0 - 37 U/L   ALT 22  0 - 53 U/L   Alkaline Phosphatase 138 (*) 39 - 117 U/L   Total Bilirubin 1.3 (*) 0.3 - 1.2 mg/dL   GFR calc non Af Amer 49 (*) >90 mL/min   GFR calc Af Amer 57 (*) >90 mL/min  Anion gap 17 (*) 5 - 15  PROTIME-INR     Status: None   Collection Time    09/12/14  4:45 AM      Result Value Ref Range   Prothrombin Time 14.3  11.6 - 15.2 seconds   INR 1.11  0.00 - 1.49  PRO B NATRIURETIC PEPTIDE     Status: Abnormal   Collection Time    09/12/14  4:45 AM      Result Value Ref Range   Pro B Natriuretic peptide (BNP) 5726.0 (*) 0 - 450 pg/mL  I-STAT TROPOININ, ED     Status: None   Collection Time    09/12/14  5:06 AM      Result Value Ref Range   Troponin i, poc 0.07  0.00 - 0.08 ng/mL   Comment 3           I-STAT CG4 LACTIC ACID, ED     Status: Abnormal   Collection Time    09/12/14  5:08 AM      Result Value Ref Range   Lactic Acid, Venous 2.69 (*) 0.5 - 2.2 mmol/L  URINALYSIS, ROUTINE W REFLEX MICROSCOPIC     Status: Abnormal   Collection Time    09/12/14  5:16 AM      Result Value Ref Range   Color, Urine AMBER (*) YELLOW   APPearance CLOUDY (*) CLEAR   Specific Gravity, Urine 1.027  1.005 - 1.030   pH 5.0  5.0 - 8.0   Glucose, UA NEGATIVE  NEGATIVE mg/dL   Hgb urine dipstick NEGATIVE  NEGATIVE   Bilirubin Urine SMALL (*) NEGATIVE   Ketones, ur 15 (*) NEGATIVE mg/dL   Protein, ur 30 (*) NEGATIVE mg/dL   Urobilinogen, UA 1.0  0.0 - 1.0 mg/dL   Nitrite NEGATIVE  NEGATIVE   Leukocytes, UA NEGATIVE  NEGATIVE  URINE MICROSCOPIC-ADD ON     Status: Abnormal   Collection Time    09/12/14  5:16 AM      Result Value Ref Range   WBC, UA 0-2  <3 WBC/hpf   RBC / HPF 0-2  <3 RBC/hpf   Bacteria, UA RARE  RARE   Casts HYALINE CASTS (*) NEGATIVE  I-STAT ARTERIAL BLOOD GAS, ED     Status: Abnormal   Collection  Time    09/12/14  5:29 AM      Result Value Ref Range   pH, Arterial 7.312 (*) 7.350 - 7.450   pCO2 arterial 51.7 (*) 35.0 - 45.0 mmHg   pO2, Arterial 273.0 (*) 80.0 - 100.0 mmHg   Bicarbonate 26.1 (*) 20.0 - 24.0 mEq/L   TCO2 28  0 - 100 mmol/L   O2 Saturation 100.0     Patient temperature 98.6 F     Sample type ARTERIAL    I-STAT ARTERIAL BLOOD GAS, ED     Status: Abnormal   Collection Time    09/12/14  8:02 AM      Result Value Ref Range   pH, Arterial 7.348 (*) 7.350 - 7.450   pCO2 arterial 45.7 (*) 35.0 - 45.0 mmHg   pO2, Arterial 76.0 (*) 80.0 - 100.0 mmHg   Bicarbonate 25.1 (*) 20.0 - 24.0 mEq/L   TCO2 26  0 - 100 mmol/L   O2 Saturation 94.0     Acid-base deficit 1.0  0.0 - 2.0 mmol/L   Collection site RADIAL, ALLEN'S TEST ACCEPTABLE     Drawn by RT     Sample type ARTERIAL  MRSA PCR SCREENING     Status: None   Collection Time    09/12/14  9:02 AM      Result Value Ref Range   MRSA by PCR NEGATIVE  NEGATIVE  LACTIC ACID, PLASMA     Status: None   Collection Time    09/12/14 10:04 AM      Result Value Ref Range   Lactic Acid, Venous 1.1  0.5 - 2.2 mmol/L  TROPONIN I     Status: Abnormal   Collection Time    09/12/14 10:04 AM      Result Value Ref Range   Troponin I 2.16 (*) <0.30 ng/mL  TRIGLYCERIDES     Status: None   Collection Time    09/12/14 10:04 AM      Result Value Ref Range   Triglycerides 102  <150 mg/dL   Dg Chest Port 1 View  09/12/2014   CLINICAL DATA:  Post intubation.  EXAM: PORTABLE CHEST - 1 VIEW  COMPARISON:  Chest radiograph May 28, 2013  FINDINGS: Endotracheal tube tip projects 2.2 cm above the carina. Nasogastric tube side port projecting at GE junction, distal tip not imaged.  The cardiac silhouette appears upper limits of normal in size, mediastinal silhouette is nonsuspicious, mildly calcified aortic knob. Central interstitial and alveolar airspace opacities. Small LEFT pleural effusion. No pneumothorax. Soft tissue planes and included  osseous structures are nonsuspicious, mild degenerative change of thoracic spine.  IMPRESSION: Endotracheal tube tip projects 2.2 cm above the carina.  Borderline cardiomegaly. Central interstitial and alveolar prominence with small LEFT pleural effusion suggests acute pulmonary edema, less likely infectious process. Nasogastric tube side port projecting at GE junction, distal tip not imaged.   Electronically Signed   By: Elon Alas   On: 09/12/2014 05:50     ASSESSMENT: 1. NSTEMI 2. Acute respiratory failure due to acute pulmonary edema.  3. CAD with known occluded LAD with R->L collaterals by cath 2014 4. Abdominal stromal tumor s/p resection 5. Acute diastolic HF  PLAN/DISCUSSION:  I think what happened is that he developed volume overload in setting of recent prep for colonoscopy. This progressed to acute pulmonary edema and respiratory failure. NSTEMI appears secondary to respiratory failure and is reflective of demand ischemia in setting of known chronic occlusion of  LAD. I do not think NSTEMI is primary event here.   He continues with marked fluid overload on exam. Continue with aggressive diuresis. Once extubated he can review with Dr. Martinique regarding consideration of LAD PCI versus ongoing medical therapy.   Treat with lasix 80 IV bid, ASA, heparin,statin. Careful with b-blocker in setting of acute HF decompensation.   Eniyah Eastmond,MD 1:38 PM

## 2014-09-12 NOTE — ED Notes (Signed)
Patient arrived via EMS on C PAP.  Skin mottled, diaphoretic  Patient states that he is tired of breathing.  Dr. Aline Brochure with patient

## 2014-09-12 NOTE — Progress Notes (Signed)
Utilization review completed. Manuella Blackson, RN, BSN. 

## 2014-09-12 NOTE — Progress Notes (Signed)
ANTICOAGULATION CONSULT NOTE - Initial Consult  Pharmacy Consult for Heparin Indication: chest pain/ACS  No Known Allergies  Patient Measurements: Height: 5' 6.93" (170 cm) Weight: 170 lb 13.7 oz (77.5 kg) IBW/kg (Calculated) : 65.94 Heparin Dosing Weight: 77.5  Vital Signs: Temp: 98.1 F (36.7 C) (09/25 0800) Temp src: Rectal (09/25 0450) BP: 122/56 mmHg (09/25 1140) Pulse Rate: 89 (09/25 1140)  Labs:  Recent Labs  09/12/14 0445 09/12/14 1004  HGB 10.1*  --   HCT 31.9*  --   PLT 406*  --   LABPROT 14.3  --   INR 1.11  --   CREATININE 1.30  --   TROPONINI  --  2.16*    Estimated Creatinine Clearance: 40.1 ml/min (by C-G formula based on Cr of 1.3).   Medical History: Past Medical History  Diagnosis Date  . Hyperlipidemia     TAKES zOCOR DAILY  . Abdominal mass 02/2013  . UTI (urinary tract infection)   . Hypertension     TAKES LOTREL,HCTZ,AND METOPROLOL DAILY  . NSTEMI (non-ST elevated myocardial infarction) 02/2013    "LIGHT: ONE MD SAID HE DID AND ONE SAID HE DIDN'T  . Pneumonia     MAR 2014  . Stromal tumor of digestive system   . CAD (coronary artery disease)     Medications:  Prescriptions prior to admission  Medication Sig Dispense Refill  . amLODipine (NORVASC) 10 MG tablet Take 10 mg by mouth daily.      [name] aspirin 81 MG tablet Take 81 mg by mouth daily.      . benazepril (LOTENSIN) 20 MG tablet Take 20 mg by mouth daily.      . hydrochlorothiazide (HYDRODIURIL) 25 MG tablet Take 25 mg by mouth daily.      . metoprolol tartrate (LOPRESSOR) 25 MG tablet Take 1 tablet (25 mg total) by mouth 2 (two) times daily.  60 tablet  2  . simvastatin (ZOCOR) 20 MG tablet Take 20 mg by mouth every evening.        Assessment: 72 YOM with recent NSTEMI admitted for acute SOB with elevated troponins and concern for ST elevation on EKG to start IV heparin with cardiology consult. Baseline INR is 1.11. H/H 10.1/31.9, Platelets 406. No bleeding reported. Patient  only reports ASA prior to admission for anticoagulation.  Goal of Therapy:  Heparin level 0.3-0.7 units/ml Monitor platelets by anticoagulation protocol: Yes   Plan:  1. Heparin bolus 4000 units x1. 2. Heparin drip 950 units/hr.  3. Heparin level in 8 hours. 4. Daily heparin level and CBC while on therapy.  Sloan Leiter, PharmD, BCPS Clinical Pharmacist [phone] 09/12/2014,12:02 PM

## 2014-09-12 NOTE — Progress Notes (Signed)
  Echocardiogram 2D Echocardiogram has been performed.  Cloyd Ragas FRANCES 09/12/2014, 9:38 AM

## 2014-09-12 NOTE — Progress Notes (Signed)
[facility] PM rounds  Cardiology note seen, appreciated  Plan diuresis overnight Start tube feedings Hopeful extubation in AM  Roselie Awkward, MD [facility] Pager: [phone] Cell: [phone] If no response, call [phone]

## 2014-09-12 NOTE — ED Notes (Signed)
EMS administered Albuterol 15mg , Atrovent 1 mg, Mag 2 grams, Solumedrol 125mg , Zofran 4 mg.

## 2014-09-12 NOTE — ED Provider Notes (Signed)
CSN: [id number]     Arrival date & time 09/12/14  3016 History   First MD Initiated Contact with Patient 09/12/14 [phone]     Chief Complaint  Patient presents with  . Respiratory Distress     (Consider location/radiation/quality/duration/timing/severity/associated sxs/prior Treatment) Patient is a 80 y.o. male presenting with shortness of breath. The history is provided by the patient.  Shortness of Breath Severity:  Severe Onset quality:  Sudden Timing:  Constant Progression:  Worsening Chronicity:  New Context comment:  Awoke from sleep Relieved by:  Nothing Worsened by:  Nothing tried Ineffective treatments:  None tried Associated symptoms: no abdominal pain, no chest pain, no cough, no fever, no headaches, no neck pain and no vomiting     Past Medical History  Diagnosis Date  . Hyperlipidemia     TAKES zOCOR DAILY  . Abdominal mass 02/2013  . UTI (urinary tract infection)   . Hypertension     TAKES LOTREL,HCTZ,AND METOPROLOL DAILY  . NSTEMI (non-ST elevated myocardial infarction) 02/2013    "LIGHT: ONE MD SAID HE DID AND ONE SAID HE DIDN'T  . Pneumonia     MAR 2014  . Stromal tumor of digestive system   . CAD (coronary artery disease)    Past Surgical History  Procedure Laterality Date  . Cyst excision  1962    wrist  . Cardiac catheterization  03-05-13  . Laparotomy N/A 06/04/2013    Procedure: EXPLORATORY LAPAROTOMY, OPEN RESECTION OF MESENTERIC AND INTESTINAL MASS;  Surgeon: [name], MD;  Location: [facility];  Service: General;  Laterality: N/A;  Small Bowel Resection   No family history on file. History  Substance Use Topics  . Smoking status: Never Smoker   . Smokeless tobacco: Never Used  . Alcohol Use: No    Review of Systems  Constitutional: Negative for fever.  HENT: Negative for drooling and rhinorrhea.   Eyes: Negative for pain.  Respiratory: Positive for shortness of breath. Negative for cough.   Cardiovascular: Negative for chest pain and  leg swelling.  Gastrointestinal: Negative for nausea, vomiting, abdominal pain and diarrhea.  Genitourinary: Negative for dysuria and hematuria.  Musculoskeletal: Negative for gait problem and neck pain.  Skin: Negative for color change.  Neurological: Negative for numbness and headaches.  Hematological: Negative for adenopathy.  Psychiatric/Behavioral: Negative for behavioral problems.  All other systems reviewed and are negative.     Allergies  Review of patient's allergies indicates no known allergies.  Home Medications   Prior to Admission medications   Medication Sig Start Date End Date Taking? Authorizing Provider  amLODipine (NORVASC) 10 MG tablet Take 10 mg by mouth daily. 04/08/14   Historical Provider, MD  aspirin 81 MG tablet Take 81 mg by mouth daily.    Historical Provider, MD  benazepril (LOTENSIN) 20 MG tablet Take 20 mg by mouth daily. 04/08/14   Historical Provider, MD  hydrochlorothiazide (HYDRODIURIL) 25 MG tablet Take 25 mg by mouth daily.    Historical Provider, MD  metoprolol tartrate (LOPRESSOR) 25 MG tablet Take 1 tablet (25 mg total) by mouth 2 (two) times daily. 03/06/13   Costin Karlyne Greenspan, MD  simvastatin (ZOCOR) 20 MG tablet Take 20 mg by mouth every evening.    Historical Provider, MD   There were no vitals taken for this visit. Physical Exam  Nursing note and vitals reviewed. Constitutional: He is oriented to person, place, and time. He appears well-developed and well-nourished.  HENT:  Head: Normocephalic and atraumatic.  Right  Ear: External ear normal.  Left Ear: External ear normal.  Nose: Nose normal.  Mouth/Throat: Oropharynx is clear and moist. No oropharyngeal exudate.  Eyes: Conjunctivae and EOM are normal. Pupils are equal, round, and reactive to light.  Neck: Normal range of motion. Neck supple.  Cardiovascular: Regular rhythm, normal heart sounds and intact distal pulses.  Exam reveals no gallop and no friction rub.   No murmur heard. HR  104   Pulmonary/Chest: He is in respiratory distress.   decreased breath sounds bilaterally.  Abdominal: Soft. Bowel sounds are normal. He exhibits no distension. There is no tenderness. There is no rebound and no guarding.  Mild soft periumbilical hernia.  Musculoskeletal: Normal range of motion. He exhibits edema (mild to moderate pitting edema in bilateral lower extremities.). He exhibits no tenderness.  Neurological: He is alert and oriented to person, place, and time.  Skin: Skin is dry.  The patient is cool, diaphoretic, and dusky in appearance.  Psychiatric: He has a normal mood and affect. His behavior is normal.    ED Course  INTUBATION Date/Time: 09/12/2014 7:38 AM Performed by: Pamella Pert Authorized by: Pamella Pert Consent: Verbal consent obtained. written consent not obtained. The procedure was performed in an emergent situation. Consent given by: patient Patient understanding: patient states understanding of the procedure being performed Required items: required blood products, implants, devices, and special equipment available Patient identity confirmed: verbally with patient, arm band, provided demographic data and hospital-assigned identification number Time out: Immediately prior to procedure a "time out" was called to verify the correct patient, procedure, equipment, support staff and site/side marked as required. Indications: respiratory failure,  respiratory distress and  hypoxemia Intubation method: glidescope. Patient status: paralyzed (RSI) Preoxygenation: bipap. Sedatives: etomidate Paralytic: succinylcholine Laryngoscope size: Mac 3 and Mac 4 Tube size: 7.5 mm Tube type: cuffed Number of attempts: 1 Cricoid pressure: no Cords visualized: yes Post-procedure assessment: chest rise,  ETCO2 monitor and CO2 detector Breath sounds: equal Cuff inflated: yes ETT to lip: 23 cm Tube secured with: ETT holder Chest x-ray interpreted by me. Chest  x-ray findings: endotracheal tube in appropriate position Patient tolerance: Patient tolerated the procedure well with no immediate complications. Comments: Copious secretions noted in oropharynx c/w pulmonary edema.    (including critical care time) Labs Review Labs Reviewed  CBC WITH DIFFERENTIAL - Abnormal; Notable for the following:    WBC 13.0 (*)    RBC 3.04 (*)    Hemoglobin 10.1 (*)    HCT 31.9 (*)    MCV 104.9 (*)    RDW 15.8 (*)    Platelets 406 (*)    Neutro Abs 8.2 (*)    Monocytes Absolute 1.3 (*)    All other components within normal limits  COMPREHENSIVE METABOLIC PANEL - Abnormal; Notable for the following:    Glucose, Bld 156 (*)    BUN 32 (*)    AST 40 (*)    Alkaline Phosphatase 138 (*)    Total Bilirubin 1.3 (*)    GFR calc non Af Amer 49 (*)    GFR calc Af Amer 57 (*)    Anion gap 17 (*)    All other components within normal limits  PRO B NATRIURETIC PEPTIDE - Abnormal; Notable for the following:    Pro B Natriuretic peptide (BNP) 5726.0 (*)    All other components within normal limits  URINALYSIS, ROUTINE W REFLEX MICROSCOPIC - Abnormal; Notable for the following:    Color, Urine AMBER (*)  APPearance CLOUDY (*)    Bilirubin Urine SMALL (*)    Ketones, ur 15 (*)    Protein, ur 30 (*)    All other components within normal limits  URINE MICROSCOPIC-ADD ON - Abnormal; Notable for the following:    Casts HYALINE CASTS (*)    All other components within normal limits  I-STAT CG4 LACTIC ACID, ED - Abnormal; Notable for the following:    Lactic Acid, Venous 2.69 (*)    All other components within normal limits  I-STAT ARTERIAL BLOOD GAS, ED - Abnormal; Notable for the following:    pH, Arterial 7.312 (*)    pCO2 arterial 51.7 (*)    pO2, Arterial 273.0 (*)    Bicarbonate 26.1 (*)    All other components within normal limits  CULTURE, BLOOD (ROUTINE X 2)  CULTURE, BLOOD (ROUTINE X 2)  URINE CULTURE  PROTIME-INR  LACTIC ACID, PLASMA  LACTIC ACID,  PLASMA  LACTIC ACID, PLASMA  TROPONIN I  TROPONIN I  TROPONIN I  BLOOD GAS, ARTERIAL  TRIGLYCERIDES  I-STAT TROPOININ, ED    Imaging Review Dg Chest Port 1 View  09/12/2014   CLINICAL DATA:  Post intubation.  EXAM: PORTABLE CHEST - 1 VIEW  COMPARISON:  Chest radiograph May 28, 2013  FINDINGS: Endotracheal tube tip projects 2.2 cm above the carina. Nasogastric tube side port projecting at GE junction, distal tip not imaged.  The cardiac silhouette appears upper limits of normal in size, mediastinal silhouette is nonsuspicious, mildly calcified aortic knob. Central interstitial and alveolar airspace opacities. Small LEFT pleural effusion. No pneumothorax. Soft tissue planes and included osseous structures are nonsuspicious, mild degenerative change of thoracic spine.  IMPRESSION: Endotracheal tube tip projects 2.2 cm above the carina.  Borderline cardiomegaly. Central interstitial and alveolar prominence with small LEFT pleural effusion suggests acute pulmonary edema, less likely infectious process. Nasogastric tube side port projecting at GE junction, distal tip not imaged.   Electronically Signed   By: Elon Alas   On: 09/12/2014 05:50     EKG Interpretation   Date/Time:  Friday September 12 2014 04:36:10 EDT Ventricular Rate:  119 PR Interval:  138 QRS Duration: 100 QT Interval:  298 QTC Calculation: 419 R Axis:   110 Text Interpretation:  Age not entered, assumed to be  80 years old for  purpose of ECG interpretation Sinus tachycardia Multiple premature  complexes, vent  Right axis deviation Low voltage, precordial leads  Anteroseptal infarct, old Repol abnrm suggests ischemia, diffuse leads  Confirmed by Tiaunna Buford  MD, Ansel Ferrall ([id number]) on 09/12/2014 5:56:24 AM     CRITICAL CARE Performed by: Pamella Pert, S Total critical care time: 30 min Critical care time was exclusive of separately billable procedures and treating other patients. Critical care was necessary to  treat or prevent imminent or life-threatening deterioration. Critical care was time spent personally by me on the following activities: development of treatment plan with patient and/or surrogate as well as nursing, discussions with consultants, evaluation of patient's response to treatment, examination of patient, obtaining history from patient or surrogate, ordering and performing treatments and interventions, ordering and review of laboratory studies, ordering and review of radiographic studies, pulse oximetry and re-evaluation of patient's condition.  MDM   Final diagnoses:  Acute respiratory failure with hypoxia  Acute pulmonary edema    4:54 AM 80 y.o. male w hx of UTI, HTN, CAD who presents with sudden onset shortness of breath which occurred prior to arrival. His wife states that he  otherwise been well and had undergone a colonoscopy and endoscopy earlier in the week which was unremarkable. On arrival the patient was alert on BiPAP. He had gotten Solu-Medrol, magnesium, DuoNeb in route. He denied any chest pain or pain anywhere on my initial exam. His oxygen saturation was in the 50s and he agreed to intubation. He was emergently intubated. Afterwards I discussed his EKG with Dr. Burt Knack (on call [facility] doc) who recommended continuing current therapy. Will get labs and imaging.  Consulted CC for admission.       Pamella Pert, MD 09/12/14 1723

## 2014-09-12 NOTE — ED Notes (Signed)
Patient with pitting edema to the lower ext

## 2014-09-12 NOTE — Progress Notes (Signed)
ANTICOAGULATION CONSULT NOTE - Follow Up Consult  Pharmacy Consult for heparin Indication: chest pain/ACS  No Known Allergies  Patient Measurements: Height: 5' 6.93" (170 cm) Weight: 170 lb 13.7 oz (77.5 kg) IBW/kg (Calculated) : 65.94 Heparin Dosing Weight: 77.5 kg  Vital Signs: Temp: 98.8 F (37.1 C) (09/25 1900) Temp src: Oral (09/25 1900) BP: 103/49 mmHg (09/25 2100) Pulse Rate: 84 (09/25 2100)  Labs:  Recent Labs  09/12/14 0445 09/12/14 1004 09/12/14 1340 09/12/14 1618 09/12/14 1915 09/12/14 1956  HGB 10.1*  --  8.8*  --   --   --   HCT 31.9*  --  27.2*  --   --   --   PLT 406*  --  278  --   --   --   LABPROT 14.3  --   --   --   --   --   INR 1.11  --   --   --   --   --   HEPARINUNFRC  --   --   --   --   --  0.33  CREATININE 1.30  --   --   --   --   --   TROPONINI  --  2.16*  --  3.74* 3.11*  --     Estimated Creatinine Clearance: 40.1 ml/min (by C-G formula based on Cr of 1.3).   Medications:  Infusions:  . feeding supplement (VITAL AF 1.2 CAL) 25 mL/hr at 09/12/14 1700  . fentaNYL infusion INTRAVENOUS 50 mcg/hr (09/12/14 0549)  . heparin 950 Units/hr (09/12/14 1422)  . propofol Stopped (09/12/14 1006)    Assessment: 80 y/o male with recent NSTEMI admitted for acute SOB with elevated troponins and concern for ST elevation on EKG to start IV heparin with cardiology consult.  Heparin level is therapeutic at 0.33 on 950 units/hr. No bleeding noted.  Goal of Therapy:  Heparin level 0.3-0.7 units/ml Monitor platelets by anticoagulation protocol: Yes   Plan:  - Continue heparin drip at 950 units/hr - Heparin level and CBC in the morning  [facility], [place].D., BCPS Clinical Pharmacist Pager: [phone] 09/12/2014 9:21 PM

## 2014-09-12 NOTE — Consult Note (Signed)
PULMONARY / CRITICAL CARE MEDICINE   Name: [name] [name] MRN: [id number] DOB: 03/27/1929    ADMISSION DATE:  09/12/2014 CONSULTATION DATE:  09/12/2014  REFERRING MD :  EDP  CHIEF COMPLAINT:  SOB  INITIAL PRESENTATION: 80 year old male presented to [facility] ED 9/25 with acute onset SOB. Profoundly hypoxic in ED with sat 50%. Was intubated in ED. CXR consistent with edema.  PCCM to admit.   STUDIES:  n/a  SIGNIFICANT EVENTS: 9/25 admitted  HISTORY OF PRESENT ILLNESS: 80 y.o. male w hx of HTN, CAD who presents with sudden onset shortness of breath. Recent history of NSTEMI and abdominal mass resection. His wife states that he otherwise been well and had undergone a colonoscopy and endoscopy earlier in the week which was unremarkable. He has noted some new peripheral edema over the past few days. On arrival the patient was alert on BiPAP. He had gotten Solu-Medrol, magnesium, DuoNeb in route. He denied any chest pain. His oxygen saturation was in the 50s and he agreed to intubation. He was emergently intubated. PCCM to see for admission.   PAST MEDICAL HISTORY :  Past Medical History  Diagnosis Date  . Hyperlipidemia     TAKES zOCOR DAILY  . Abdominal mass 02/2013  . UTI (urinary tract infection)   . Hypertension     TAKES LOTREL,HCTZ,AND METOPROLOL DAILY  . NSTEMI (non-ST elevated myocardial infarction) 02/2013    "LIGHT: ONE MD SAID HE DID AND ONE SAID HE DIDN'T  . Pneumonia     MAR 2014  . Stromal tumor of digestive system   . CAD (coronary artery disease)    Past Surgical History  Procedure Laterality Date  . Cyst excision  1962    wrist  . Cardiac catheterization  03-05-13  . Laparotomy N/A 06/04/2013    Procedure: EXPLORATORY LAPAROTOMY, OPEN RESECTION OF MESENTERIC AND INTESTINAL MASS;  Surgeon: [name], MD;  Location: [facility];  Service: General;  Laterality: N/A;  Small Bowel Resection   Prior to Admission medications   Medication Sig Start Date End Date Taking? Authorizing  Provider  amLODipine (NORVASC) 10 MG tablet Take 10 mg by mouth daily. 04/08/14   Historical Provider, MD  aspirin 81 MG tablet Take 81 mg by mouth daily.    Historical Provider, MD  benazepril (LOTENSIN) 20 MG tablet Take 20 mg by mouth daily. 04/08/14   Historical Provider, MD  hydrochlorothiazide (HYDRODIURIL) 25 MG tablet Take 25 mg by mouth daily.    Historical Provider, MD  metoprolol tartrate (LOPRESSOR) 25 MG tablet Take 1 tablet (25 mg total) by mouth 2 (two) times daily. 03/06/13   Costin Karlyne Greenspan, MD  simvastatin (ZOCOR) 20 MG tablet Take 20 mg by mouth every evening.    Historical Provider, MD   No Known Allergies  FAMILY HISTORY:  History reviewed. No pertinent family history. SOCIAL HISTORY:  reports that he has never smoked. He has never used smokeless tobacco. He reports that he does not drink alcohol or use illicit drugs.  REVIEW OF SYSTEMS:  Unable, intubated  SUBJECTIVE:   VITAL SIGNS: Temp:  [97.5 F (36.4 C)-98.1 F (36.7 C)] 97.5 F (36.4 C) (09/25 0515) Pulse Rate:  [98-115] 98 (09/25 0515) Resp:  [20-31] 21 (09/25 0515) BP: (104-135)/(50-65) 104/50 mmHg (09/25 0515) SpO2:  [9 %-100 %] 100 % (09/25 0515) FiO2 (%):  [50 %-100 %] 50 % (09/25 0535) Weight:  [77.1 kg (169 lb 15.6 oz)] 77.1 kg (169 lb 15.6 oz) (09/25 0500) HEMODYNAMICS:  VENTILATOR SETTINGS: Vent Mode:  [-] PRVC FiO2 (%):  [50 %-100 %] 50 % Set Rate:  [14 bmp-16 bmp] 16 bmp Vt Set:  [500 mL] 500 mL PEEP:  [5 cmH20] 5 cmH20 Plateau Pressure:  [21 cmH20] 21 cmH20 INTAKE / OUTPUT:  Intake/Output Summary (Last 24 hours) at 09/12/14 0550 Last data filed at 09/12/14 0520  Gross per 24 hour  Intake      0 ml  Output      1 ml  Net     -1 ml    PHYSICAL EXAMINATION: General:  Elderly male on vent in NAD Neuro:  Sedated on vent HEENT:  [place]/AT, PERRL Cardiovascular:  RRR, 3/6 SEM Lungs:  Coarse rhonchi throughout Abdomen:  Distended, normal appearance per wife, 2 hernias.   Musculoskeletal:  No acute deformity, +2 pitting edema to mid shin  Skin:  Intact  LABS:  CBC  Recent Labs Lab 09/12/14 0445  WBC 13.0*  HGB 10.1*  HCT 31.9*  PLT 406*   Coag's  Recent Labs Lab 09/12/14 0445  INR 1.11   BMET No results found for this basename: NA, K, CL, CO2, BUN, CREATININE, GLUCOSE,  in the last 168 hours Electrolytes No results found for this basename: CALCIUM, MG, PHOS,  in the last 168 hours Sepsis Markers  Recent Labs Lab 09/12/14 0508  LATICACIDVEN 2.69*   ABG  Recent Labs Lab 09/12/14 0529  PHART 7.312*  PCO2ART 51.7*  PO2ART 273.0*   Liver Enzymes No results found for this basename: AST, ALT, ALKPHOS, BILITOT, ALBUMIN,  in the last 168 hours Cardiac Enzymes No results found for this basename: TROPONINI, PROBNP,  in the last 168 hours Glucose No results found for this basename: GLUCAP,  in the last 168 hours  Imaging No results found.   ASSESSMENT / PLAN:  PULMONARY OETT 9/25 >>> A: Acute hypoxemic/hypercarbic respiratory failure in setting of edema Pulmonary edema H/o COPD  P:   Full vent support Follow CXR, ABG VAP prevention Diuresis when BP can tolerate (improving with reduction of propofol) Daily SBT  CARDIOVASCULAR CVL A:  Hypotension - ?propofol related Acute CHF (LVEF 55% with grade 1 DD 02/2013) NSTEMI P:  Titrate down propofol KVO IVF in setting of edema. ? CHF May need CVL and pressors If so, trend CVP Trend lactic acid, troponin Check Echo Hold amlodipine, benazepril, HCTZ, metoprolol Continue preadmission statin, asa  RENAL A:   No acute issues  P:   Follow Bmet KVO IVF  GASTROINTESTINAL A:   No acute issues  P:   SUP ppx: IV protonix NPO  HEMATOLOGIC A:   Anemia  P:  Follow CBC Heparin for VTE ppx  INFECTIOUS A:   Leukocytosis  P:   BCx2 9/25 >>> UC 9/25 >>> Monitor off ABX  ENDOCRINE A:   No acute issues   P:   Follow glucose on  chemistry  NEUROLOGIC A:   Acute encephalopathy   P:   RASS goal: -1 Propofol gtt Fentanyl gtt PAD protocol Daily WUA   Family/patient updated last on: 9/25 By day 3  Interdisciplinary Family Meeting v Palliative Care Meeting on: By day [place], [place] Pulmonology/Critical Care Pager [phone] or [phone]  Attending:  I have seen and examined the patient with nurse practitioner/resident and agree with the note above.   I am concerned about the EKG changes seen this morning, worrisome for ischemia.  Start ASA, Heparin, f/u echo, f/u echocardiogram. Sons updated at bedside Continue  diuresis Plan extubation depending on cardiology consult.  I have personally obtained a history, examined the patient, evaluated laboratory and imaging results, formulated the assessment and plan and placed orders. CRITICAL CARE: The patient is critically ill with multiple organ systems failure and requires high complexity decision making for assessment and support, frequent evaluation and titration of therapies, application of advanced monitoring technologies and extensive interpretation of multiple databases. Critical Care Time devoted to patient care services described in this note is 45 minutes.   Roselie Awkward, MD [place] PCCM Pager: [phone] Cell: [phone] If no response, call [phone]    09/12/2014, 5:50 AM

## 2014-09-12 NOTE — Progress Notes (Signed)
INITIAL NUTRITION ASSESSMENT  DOCUMENTATION CODES Per approved criteria  -Not Applicable   INTERVENTION:  If unable to extubate patient today, recommend initiate TF via OGT with Vital AF 1.2 at 25 ml/h and Prostat 30 ml once daily on day 1; on day 2, increase to goal rate of 50 ml/h (1200 ml per day) to provide 1540 kcals, 105 gm protein, 973 ml free water daily.  NUTRITION DIAGNOSIS: Inadequate oral intake related to inability to eat as evidenced by NPO status.   Goal: Intake to meet >90% of estimated nutrition needs.  Monitor:  TF tolerance/adequacy, weight trend, labs, vent status.  Reason for Assessment: MD Consult for TF initiation and management.  80 y.o. male  Admitting Dx: SOB  ASSESSMENT: 80 year old male presented to [facility] ED 9/25 with acute onset SOB. Profoundly hypoxic in ED with sat 50%. Was intubated in ED. CXR consistent with edema.   Patient's sons report that patient has had a poor appetite for the past month, but weight has been stable. No nutrition issues identified PTA.  Nutrition Focused Physical Exam:  Subcutaneous Fat:  Orbital Region: WNL Upper Arm Region: WNL Thoracic and Lumbar Region: NA  Muscle:  Temple Region: mild depletion Clavicle Bone Region: mild depletion Clavicle and Acromion Bone Region: mild depletion Scapular Bone Region: NA Dorsal Hand: NA Patellar Region: WNL Anterior Thigh Region: WNL Posterior Calf Region: WNL  Edema: pitting edema to LE   Patient is currently intubated on ventilator support MV: 8.3 L/min Temp (24hrs), Avg:97.7 F (36.5 C), Min:97 F (36.1 C), Max:98.1 F (36.7 C)  Propofol: none  Height: Ht Readings from Last 1 Encounters:  09/12/14 5' 6.93" (1.7 m)    Weight: Wt Readings from Last 1 Encounters:  09/12/14 170 lb 13.7 oz (77.5 kg)    Ideal Body Weight: 67.3 kg  % Ideal Body Weight: 115%  Wt Readings from Last 10 Encounters:  09/12/14 170 lb 13.7 oz (77.5 kg)  07/22/14 170 lb (77.111 kg)   05/02/14 173 lb (78.472 kg)  04/29/14 172 lb 9.6 oz (78.291 kg)  12/30/13 175 lb 8 oz (79.606 kg)  10/31/13 172 lb 3.2 oz (78.109 kg)  10/30/13 171 lb (77.565 kg)  07/31/13 170 lb (77.111 kg)  07/26/13 170 lb 6.4 oz (77.293 kg)  07/09/13 169 lb 6.4 oz (76.839 kg)    Usual Body Weight: 170-175 lb  % Usual Body Weight: 100%  BMI:  Body mass index is 26.82 kg/(m^2).  Estimated Nutritional Needs: Kcal: 1550 Protein: 100-120 gm Fluid: 1.6 L  Skin: no issues  Diet Order: NPO  EDUCATION NEEDS: -Education not appropriate at this time   Intake/Output Summary (Last 24 hours) at 09/12/14 1121 Last data filed at 09/12/14 1006  Gross per 24 hour  Intake  93.72 ml  Output    126 ml  Net -32.28 ml    Last BM: None documented since admission   Labs:   Recent Labs Lab 09/12/14 0445  NA 147  K 4.4  CL 106  CO2 24  BUN 32*  CREATININE 1.30  CALCIUM 8.8  GLUCOSE 156*    CBG (last 3)  No results found for this basename: GLUCAP,  in the last 72 hours  Scheduled Meds: . antiseptic oral rinse  7 mL Mouth Rinse QID  . aspirin  324 mg Oral NOW   Or  . aspirin  300 mg Rectal NOW  . [START ON 09/13/2014] aspirin  81 mg Oral Daily  . chlorhexidine  15  mL Mouth Rinse BID  . heparin  5,000 Units Subcutaneous 3 times per day  . lidocaine (cardiac) 100 mg/1ml      . pantoprazole (PROTONIX) IV  40 mg Intravenous QHS  . rocuronium      . simvastatin  20 mg Per Tube QPM  . sodium chloride  3 mL Intravenous Q12H    Continuous Infusions: . fentaNYL infusion INTRAVENOUS 50 mcg/hr (09/12/14 0549)  . propofol Stopped (09/12/14 1006)    Past Medical History  Diagnosis Date  . Hyperlipidemia     TAKES zOCOR DAILY  . Abdominal mass 02/2013  . UTI (urinary tract infection)   . Hypertension     TAKES LOTREL,HCTZ,AND METOPROLOL DAILY  . NSTEMI (non-ST elevated myocardial infarction) 02/2013    "LIGHT: ONE MD SAID HE DID AND ONE SAID HE DIDN'T  . Pneumonia     MAR 2014  .  Stromal tumor of digestive system   . CAD (coronary artery disease)     Past Surgical History  Procedure Laterality Date  . Cyst excision  1962    wrist  . Cardiac catheterization  03-05-13  . Laparotomy N/A 06/04/2013    Procedure: EXPLORATORY LAPAROTOMY, OPEN RESECTION OF MESENTERIC AND INTESTINAL MASS;  Surgeon: [name], MD;  Location: [facility];  Service: General;  Laterality: N/A;  Small Bowel Resection    Molli Barrows, RD, LDN, [place] Pager [phone] After Hours Pager [phone]

## 2014-09-12 NOTE — ED Notes (Signed)
Unable to take report.

## 2014-09-12 NOTE — ED Notes (Signed)
Results of lactic acid given to the primary nurse, Sherryl Manges

## 2014-09-13 ENCOUNTER — Encounter (HOSPITAL_COMMUNITY): Payer: Self-pay | Admitting: Radiology

## 2014-09-13 ENCOUNTER — Inpatient Hospital Stay (HOSPITAL_COMMUNITY): Payer: Medicare Other

## 2014-09-13 DIAGNOSIS — R578 Other shock: Secondary | ICD-10-CM

## 2014-09-13 DIAGNOSIS — J9600 Acute respiratory failure, unspecified whether with hypoxia or hypercapnia: Secondary | ICD-10-CM

## 2014-09-13 DIAGNOSIS — I5033 Acute on chronic diastolic (congestive) heart failure: Secondary | ICD-10-CM | POA: Diagnosis not present

## 2014-09-13 HISTORY — DX: Other shock: R57.8

## 2014-09-13 LAB — CBC
HCT: 18.6 % — ABNORMAL LOW (ref 39.0–52.0)
HEMATOCRIT: 19.9 % — AB (ref 39.0–52.0)
HEMATOCRIT: 26.8 % — AB (ref 39.0–52.0)
HEMOGLOBIN: 6 g/dL — AB (ref 13.0–17.0)
Hemoglobin: 6.3 g/dL — CL (ref 13.0–17.0)
Hemoglobin: 9.1 g/dL — ABNORMAL LOW (ref 13.0–17.0)
MCH: 31.7 pg (ref 26.0–34.0)
MCH: 32.3 pg (ref 26.0–34.0)
MCH: 31.6 pg (ref 26.0–34.0)
MCHC: 31.7 g/dL (ref 30.0–36.0)
MCHC: 32.3 g/dL (ref 30.0–36.0)
MCHC: 34 g/dL (ref 30.0–36.0)
MCV: 100 fL (ref 78.0–100.0)
MCV: 100 fL (ref 78.0–100.0)
MCV: 93.1 fL (ref 78.0–100.0)
PLATELETS: 376 10*3/uL (ref 150–400)
Platelets: 374 10*3/uL (ref 150–400)
Platelets: 361 10*3/uL (ref 150–400)
RBC: 1.99 MIL/uL — ABNORMAL LOW (ref 4.22–5.81)
RBC: 1.86 MIL/uL — AB (ref 4.22–5.81)
RBC: 2.88 MIL/uL — ABNORMAL LOW (ref 4.22–5.81)
RDW: 15.8 % — ABNORMAL HIGH (ref 11.5–15.5)
RDW: 15.7 % — ABNORMAL HIGH (ref 11.5–15.5)
RDW: 17.9 % — AB (ref 11.5–15.5)
WBC: 15.5 10*3/uL — ABNORMAL HIGH (ref 4.0–10.5)
WBC: 16 10*3/uL — ABNORMAL HIGH (ref 4.0–10.5)
WBC: 17.7 10*3/uL — ABNORMAL HIGH (ref 4.0–10.5)

## 2014-09-13 LAB — BASIC METABOLIC PANEL
ANION GAP: 10 (ref 5–15)
BUN: 45 mg/dL — ABNORMAL HIGH (ref 6–23)
CO2: 29 meq/L (ref 19–32)
CREATININE: 1.83 mg/dL — AB (ref 0.50–1.35)
Calcium: 8.1 mg/dL — ABNORMAL LOW (ref 8.4–10.5)
Chloride: 108 mEq/L (ref 96–112)
GFR calc Af Amer: 38 mL/min — ABNORMAL LOW (ref >90)
GFR calc non Af Amer: 32 mL/min — ABNORMAL LOW (ref >90)
Glucose, Bld: 213 mg/dL — ABNORMAL HIGH (ref 70–99)
Potassium: 4.9 mEq/L (ref 3.7–5.3)
Sodium: 147 mEq/L (ref 137–147)

## 2014-09-13 LAB — GLUCOSE, CAPILLARY
Glucose-Capillary: 188 mg/dL — ABNORMAL HIGH (ref 70–99)
Glucose-Capillary: 169 mg/dL — ABNORMAL HIGH (ref 70–99)
Glucose-Capillary: 170 mg/dL — ABNORMAL HIGH (ref 70–99)

## 2014-09-13 LAB — PROTIME-INR
INR: 1.18 (ref 0.00–1.49)
Prothrombin Time: 15 seconds (ref 11.6–15.2)

## 2014-09-13 LAB — URINE CULTURE
Colony Count: NO GROWTH
Culture: NO GROWTH

## 2014-09-13 LAB — TROPONIN I: Troponin I: 1.93 ng/mL (ref <0.30)

## 2014-09-13 LAB — PREPARE RBC (CROSSMATCH)

## 2014-09-13 LAB — LACTIC ACID, PLASMA: Lactic Acid, Venous: 1.3 mmol/L (ref 0.5–2.2)

## 2014-09-13 LAB — HEPARIN LEVEL (UNFRACTIONATED): Heparin Unfractionated: 0.39 IU/mL (ref 0.30–0.70)

## 2014-09-13 MED ORDER — MIDAZOLAM HCL 2 MG/2ML IJ SOLN
INTRAMUSCULAR | Status: AC
  Administered 2014-09-13: 1 mg
  Filled 2014-09-13: qty 2

## 2014-09-13 MED ORDER — SODIUM CHLORIDE 0.9 % IV BOLUS (SEPSIS)
500.0000 mL | INTRAVENOUS | Status: AC
  Administered 2014-09-13: 500 mL via INTRAVENOUS

## 2014-09-13 MED ORDER — SODIUM CHLORIDE 0.9 % IV SOLN: Freq: Once | INTRAVENOUS | Status: DC

## 2014-09-13 MED ORDER — DEXTROSE 5 % IV SOLN
2.0000 ug/min | INTRAVENOUS | Status: DC
  Administered 2014-09-13: 10 ug/min via INTRAVENOUS
  Administered 2014-09-13: 5 ug/min via INTRAVENOUS
  Administered 2014-09-13 (×3): 20 ug/min via INTRAVENOUS
  Administered 2014-09-14: 8 ug/min via INTRAVENOUS
  Administered 2014-09-15: 4 ug/min via INTRAVENOUS
  Filled 2014-09-13 (×7): qty 4

## 2014-09-13 MED ORDER — SODIUM CHLORIDE 0.9 % IV SOLN
Freq: Once | INTRAVENOUS | Status: AC
  Administered 2014-09-13: 07:00:00 via INTRAVENOUS

## 2014-09-13 MED ORDER — INSULIN ASPART 100 UNIT/ML ~~LOC~~ SOLN
2.0000 [IU] | SUBCUTANEOUS | Status: DC
  Administered 2014-09-13 (×4): 4 [IU] via SUBCUTANEOUS
  Administered 2014-09-14 – 2014-09-15 (×7): 2 [IU] via SUBCUTANEOUS

## 2014-09-13 NOTE — Progress Notes (Signed)
[facility] Progress Note Patient Name: [name] [name] DOB: 06-07-1929 MRN: [id number]   Date of Service  09/13/2014  HPI/Events of Note  AM labs back> Hgb 6.8, down significantly Per bedside RN, no evidence of bleeding  eICU Interventions  Stat repeat CBC and type and crossmatch as may need transfusion     Intervention Category Major Interventions: Hemorrhage - evaluation and management  Timo Hartwig 09/13/2014, 3:49 AM

## 2014-09-13 NOTE — Procedures (Signed)
Staff note  - supervised procedure. . A record of image was made and submitted for record filing / A record of image was made but could not be submitted for filing due to malfunction of printing device    - Dr. Brand Males, M.D., [facility].C.P Pulmonary and Critical Care Medicine Staff Physician [facility] Pager: [phone], If no answer or between  15:00h - 7:00h: call [phone]  09/13/2014 6:25 PM

## 2014-09-13 NOTE — Procedures (Signed)
Central Venous Catheter Insertion Procedure Note [name] [name] [id number] October 20, 1929  Procedure: Insertion of Central Venous Catheter Indications: Assessment of intravascular volume, Drug and/or fluid administration and Frequent blood sampling  Procedure Details Consent: Risks of procedure as well as the alternatives and risks of each were explained to the (patient/caregiver).  Consent for procedure obtained. Time Out: Verified patient identification, verified procedure, site/side was marked, verified correct patient position, special equipment/implants available, medications/allergies/relevent history reviewed, required imaging and test results available.  Performed Real time [place] was used to ID and cannulate the vessel Maximum sterile technique was used including antiseptics, cap, gloves, gown, hand hygiene, mask and sheet. Skin prep: Chlorhexidine; local anesthetic administered A antimicrobial bonded/coated triple lumen catheter was placed in the right internal jugular vein using the Seldinger technique.  Evaluation Blood flow good Complications: No apparent complications Patient did tolerate procedure well. Chest X-ray ordered to verify placement.  CXR: pending.  BABCOCK,PETE 09/13/2014, 3:27 PM

## 2014-09-13 NOTE — Progress Notes (Signed)
eLink Physician-Brief Progress Note Patient Name: [name] [name] DOB: Apr 20, 1929 MRN: [id number]   Date of Service  09/13/2014  HPI/Events of Note  Shock> found to have intraperitoneal blood on CT abdomen  eICU Interventions  Surgery consulted Start neo Blood now > keep ahead 2 units     Intervention Category Major Interventions: Hypotension - evaluation and management  MCQUAID, [name] 09/13/2014, 7:17 AM

## 2014-09-13 NOTE — Progress Notes (Addendum)
PULMONARY / CRITICAL CARE MEDICINE   Name: [name] [name] MRN: [id number] DOB: [birth year]    ADMISSION DATE:  09/12/2014 CONSULTATION DATE:  09/12/2014  REFERRING MD :  EDP  CHIEF COMPLAINT:  SOB  INITIAL PRESENTATION:  80 year old male presented to [facility] ED 9/25 with acute onset SOB. Profoundly hypoxic in ED with sat 50%. Was intubated in ED. CXR consistent with edema.  PCCM to admit.   STUDIES:  ECHO 9/25>>>  SIGNIFICANT EVENTS: 9/25 admitted. + NSTEMI vs demand ischemia and pulmonary edema. Felt r/t volume overload from recent prep for colonoscopy.  Treated w/ ASA, beta blocker, heparin and statin.  9/26: acute hgb drop. 8.8-->6.0. CT abd positive for RPB. Heparin stopped. Got 2 units PRBC for  Hemorrhagic shock, required levophed for support. Heparin gtt stopped. Surgery following.     SUBJECTIVE:  Sedated on vent   VITAL SIGNS: Temp:  [97.5 F (36.4 C)-98.9 F (37.2 C)] 98.9 F (37.2 C) (09/26 0845) Pulse Rate:  [73-117] 107 (09/26 0845) Resp:  [16-22] 16 (09/26 0845) BP: (66-125)/(34-58) 88/52 mmHg (09/26 0906) SpO2:  [96 %-100 %] 98 % (09/26 0845) FiO2 (%):  [30 %-50 %] 30 % (09/26 0906) Weight:  [75.5 kg (166 lb 7.2 oz)] 75.5 kg (166 lb 7.2 oz) (09/26 0500) HEMODYNAMICS:   VENTILATOR SETTINGS: Vent Mode:  [-] PRVC FiO2 (%):  [30 %-50 %] 30 % Set Rate:  [16 bmp] 16 bmp Vt Set:  [500 mL] 500 mL PEEP:  [5 cmH20] 5 cmH20 Plateau Pressure:  [17 cmH20-19 cmH20] 18 cmH20 INTAKE / OUTPUT:  Intake/Output Summary (Last 24 hours) at 09/13/14 0908 Last data filed at 09/13/14 0830  Gross per 24 hour  Intake 1396.94 ml  Output   2715 ml  Net -1318.06 ml    PHYSICAL EXAMINATION: General:  Elderly male on vent in NAD Neuro:  Sedated on vent. Follows commands to WUA (CAM-ICU neg for delirum) HEENT:  [place]/AT, PERRL Cardiovascular:  RRR, 3/6 SEM Lungs:  Clear, no accessory muscle use  Abdomen:  Distended, normal appearance per wife, 2 hernias. pender to palp  Musculoskeletal:   No acute deformity, +2 pitting edema to mid shin  Skin:  Intact  LABS: PULMONARY  Recent Labs Lab 09/12/14 0529 09/12/14 0802  PHART 7.312* 7.348*  PCO2ART 51.7* 45.7*  PO2ART 273.0* 76.0*  HCO3 26.1* 25.1*  TCO2 28 26  O2SAT 100.0 94.0    CBC  Recent Labs Lab 09/13/14 0230 09/13/14 0417 09/13/14 1115  HGB 6.3* 6.0* 9.1*  HCT 19.9* 18.6* 26.8*  WBC 15.5* 16.0* 17.7*  PLT 374 361 376    COAGULATION  Recent Labs Lab 09/12/14 0445 09/13/14 1115  INR 1.11 1.18    CARDIAC   Recent Labs Lab 09/12/14 1004 09/12/14 1618 09/12/14 1915 09/13/14 1115  TROPONINI 2.16* 3.74* 3.11* 1.93*    Recent Labs Lab 09/12/14 0445  PROBNP 5726.0*     CHEMISTRY  Recent Labs Lab 09/12/14 0445 09/13/14 0230  NA 147 147  K 4.4 4.9  CL 106 108  CO2 24 29  GLUCOSE 156* 213*  BUN 32* 45*  CREATININE 1.30 1.83*  CALCIUM 8.8 8.1*   Estimated Creatinine Clearance: 28.5 ml/min (by C-G formula based on Cr of 1.83).   LIVER  Recent Labs Lab 09/12/14 0445 09/13/14 1115  AST 40*  --   ALT 22  --   ALKPHOS 138*  --   BILITOT 1.3*  --   PROT 7.5  --   ALBUMIN 3.5  --  INR 1.11 1.18     INFECTIOUS  Recent Labs Lab 09/12/14 1400 09/12/14 1915 09/13/14 1115  LATICACIDVEN 1.3 1.1 1.3     ENDOCRINE CBG (last 3)   Recent Labs  09/13/14 1016 09/13/14 1216  GLUCAP 188* 169*         IMAGING x48h Dg Chest Port 1 View  09/12/2014   CLINICAL DATA:  Post intubation.  EXAM: PORTABLE CHEST - 1 VIEW  COMPARISON:  Chest radiograph May 28, 2013  FINDINGS: Endotracheal tube tip projects 2.2 cm above the carina. Nasogastric tube side port projecting at GE junction, distal tip not imaged.  The cardiac silhouette appears upper limits of normal in size, mediastinal silhouette is nonsuspicious, mildly calcified aortic knob. Central interstitial and alveolar airspace opacities. Small LEFT pleural effusion. No pneumothorax. Soft tissue planes and included osseous  structures are nonsuspicious, mild degenerative change of thoracic spine.  IMPRESSION: Endotracheal tube tip projects 2.2 cm above the carina.  Borderline cardiomegaly. Central interstitial and alveolar prominence with small LEFT pleural effusion suggests acute pulmonary edema, less likely infectious process. Nasogastric tube side port projecting at GE junction, distal tip not imaged.   Electronically Signed   By: Elon Alas   On: 09/12/2014 05:50      ASSESSMENT / PLAN:  PULMONARY OETT 9/25 >>> A: Acute hypoxemic/hypercarbic respiratory failure in setting of edema Pulmonary edema-->improved w/ positive pressure  H/o COPD  P:   Full vent support, initiate weaning efforts when hemodynamically stable  Follow CXR, ABG VAP prevention Daily SBT  CARDIOVASCULAR CVL A:  Hypotension - initially propofol related. On 9/26 precipitated by blood loss and hypovolemic/hemorrhagic shock  Acute CHF (LVEF 55% with grade 1 DD 02/2013) NSTEMI  P:  repeat lactic acid, troponin  Will need CVL and CVP monitoring if can't get him off levophed  Check Echo Hold amlodipine, benazepril, HCTZ, metoprolol Continue preadmission statin, asa Hold lasix   RENAL A:   AKI in setting of hypoperfusion   P:   Avoid hypotension Renal dose meds F/u chemistry   GASTROINTESTINAL A:   Elevated LFTs  P:   SUP ppx: IV protonix NPO F/u lft 9/27 Hold tubefeeds for now. Resume 9/27 if hgb stable   HEMATOLOGIC A:   Anemia Acute blood loss anemia in setting of spontaneous Retroperitoneal hemorrhage   P:  Follow CBC D/c anticoagulation Correct coags Place PAS Trend CBC Transfuse for hgb <8 (in view of recent NSTEMI) Or symptomatic anemia    INFECTIOUS A:   Leukocytosis  P:   BCx2 9/25 >>> UC 9/25 >>> Monitor off ABX  ENDOCRINE A:   No acute issues   P:   Follow glucose on chemistry  NEUROLOGIC A:   Acute encephalopathy    - normal WUA 09/13/14  P:   RASS goal: -1 Fentanyl  gtt PAD protocol Daily WUA  FAMILY -  Updates: pdated last on: 9/26 at the bedside  -  By Day 7:  [place] Meeting on:  N/A     TODAYS STAFF MD [facility] Critically ill. Think sequence of events: colonoscopy-->pulmonary edema/demand ischemia (from colonoscopy prep and vol overload)-->resp failure-->hemorrhagic shock from RPB on heparin gtt.Marland KitchenMarland KitchenHave stopped heparin. Cards and Surgery are following. Getting blood, but remains pressor dependant. If he remains on levophed after this second unit of blood will need central access. Will repeat lactic acid and cardiac enzymes now. No weaning as we don't need to increase cardiac demand at this point.   utes.  Marni Griffon ACNP 09/13/2014, 9:08 AM    I have personally obtained a history, examined the patient, evaluated laboratory and imaging results, formulated the assessment and plan and placed orders. CRITICAL CARE: The patient is critically ill with multiple organ systems failure and requires high complexity decision making for assessment and support, frequent evaluation and titration of therapies, application of advanced monitoring technologies and extensive interpretation of multiple databases. Critical Care Time devoted to patient care services described in this note is 35 min

## 2014-09-13 NOTE — Progress Notes (Signed)
Patient ID: [name] [name], male   DOB: 09/22/1929, 80 y.o.   MRN: [id number] Denies abdominal pain. Abdomen NT. Hb up after transfusion. INR OK. Georganna Skeans, MD, MPH, FACS Trauma: [phone] General Surgery: [phone]

## 2014-09-13 NOTE — Consult Note (Signed)
Reason for Consult:anticoagulation related hemoperitoneum Referring Physician: [name] is an 80 y.o. male.  HPI: [name] [name] is known to our practice status post colectomy and resection of mesenteric tumor by Dr. Dalbert [name] in June, 2014. He recently underwent upper endoscopy and colonoscopy which was reportedly unremarkable. Over the following days, he developed respiratory difficulties and was admitted to the hospital. He was found to have non-STEMI and has been anticoagulated. He was noted this morning to have a decrease in his hemoglobin From 8.82 6.0. Stat CT demonstrates hemoperitoneum without identified source. He is on the ventilator but is able to convey that he has no abdominal pain. He is receiving transfusions.  Past Medical History  Diagnosis Date  . Hyperlipidemia     TAKES zOCOR DAILY  . Abdominal mass 02/2013  . UTI (urinary tract infection)   . Hypertension     TAKES LOTREL,HCTZ,AND METOPROLOL DAILY  . NSTEMI (non-ST elevated myocardial infarction) 02/2013    "LIGHT: ONE MD SAID HE DID AND ONE SAID HE DIDN'T  . Pneumonia     MAR 2014  . Stromal tumor of digestive system   . CAD (coronary artery disease)     Past Surgical History  Procedure Laterality Date  . Cyst excision  1962    wrist  . Cardiac catheterization  03-05-13  . Laparotomy N/A 06/04/2013    Procedure: EXPLORATORY LAPAROTOMY, OPEN RESECTION OF MESENTERIC AND INTESTINAL MASS;  Surgeon: [name] Hector, MD;  Location: [facility];  Service: General;  Laterality: N/A;  Small Bowel Resection    History reviewed. No pertinent family history.  Social History:  reports that he has never smoked. He has never used smokeless tobacco. He reports that he does not drink alcohol or use illicit drugs.  Allergies: No Known Allergies  Medications:  Scheduled: . sodium chloride   Intravenous Once  . antiseptic oral rinse  7 mL Mouth Rinse QID  . aspirin  325 mg Oral Daily  . chlorhexidine  15 mL Mouth Rinse BID   . furosemide  80 mg Intravenous BID  . Influenza vac split quadrivalent PF  0.5 mL Intramuscular Tomorrow-1000  . pantoprazole (PROTONIX) IV  40 mg Intravenous QHS  . pneumococcal 23 valent vaccine  0.5 mL Intramuscular Tomorrow-1000  . potassium chloride  20 mEq Oral BID  . simvastatin  20 mg Per Tube QPM  . sodium chloride  3 mL Intravenous Q12H   Continuous: . feeding supplement (VITAL AF 1.2 CAL) Stopped (09/13/14 0727)  . fentaNYL infusion INTRAVENOUS 50 mcg/hr (09/12/14 0549)  . heparin Stopped (09/13/14 0400)  . norepinephrine (LEVOPHED) Adult infusion 5 mcg/min (09/13/14 0754)  . propofol Stopped (09/12/14 1006)   DPO:EUMPNT chloride, sodium chloride, fentaNYL, sodium chloride  Results for orders placed during the hospital encounter of 09/12/14 (from the past 48 hour(s))  CBC WITH DIFFERENTIAL     Status: Abnormal   Collection Time    09/12/14  4:45 AM      Result Value Ref Range   WBC 13.0 (*) 4.0 - 10.5 K/uL   RBC 3.04 (*) 4.22 - 5.81 MIL/uL   Hemoglobin 10.1 (*) 13.0 - 17.0 g/dL   HCT 31.9 (*) 39.0 - 52.0 %   MCV 104.9 (*) 78.0 - 100.0 fL   MCH 33.2  26.0 - 34.0 pg   MCHC 31.7  30.0 - 36.0 g/dL   RDW 15.8 (*) 11.5 - 15.5 %   Platelets 406 (*) 150 - 400 K/uL   Neutrophils Relative %  63  43 - 77 %   Neutro Abs 8.2 (*) 1.7 - 7.7 K/uL   Lymphocytes Relative 25  12 - 46 %   Lymphs Abs 3.3  0.7 - 4.0 K/uL   Monocytes Relative 10  3 - 12 %   Monocytes Absolute 1.3 (*) 0.1 - 1.0 K/uL   Eosinophils Relative 2  0 - 5 %   Eosinophils Absolute 0.2  0.0 - 0.7 K/uL   Basophils Relative 0  0 - 1 %   Basophils Absolute 0.0  0.0 - 0.1 K/uL  COMPREHENSIVE METABOLIC PANEL     Status: Abnormal   Collection Time    09/12/14  4:45 AM      Result Value Ref Range   Sodium 147  137 - 147 mEq/L   Potassium 4.4  3.7 - 5.3 mEq/L   Comment: HEMOLYSIS AT THIS LEVEL MAY AFFECT RESULT   Chloride 106  96 - 112 mEq/L   CO2 24  19 - 32 mEq/L   Glucose, Bld 156 (*) 70 - 99 mg/dL   BUN 32  (*) 6 - 23 mg/dL   Creatinine, Ser 1.30  0.50 - 1.35 mg/dL   Calcium 8.8  8.4 - 10.5 mg/dL   Total Protein 7.5  6.0 - 8.3 g/dL   Albumin 3.5  3.5 - 5.2 g/dL   AST 40 (*) 0 - 37 U/L   Comment: HEMOLYSIS AT THIS LEVEL MAY AFFECT RESULT   ALT 22  0 - 53 U/L   Comment: HEMOLYSIS AT THIS LEVEL MAY AFFECT RESULT   Alkaline Phosphatase 138 (*) 39 - 117 U/L   Total Bilirubin 1.3 (*) 0.3 - 1.2 mg/dL   GFR calc non Af Amer 49 (*) >90 mL/min   GFR calc Af Amer 57 (*) >90 mL/min   Comment: (NOTE)     The eGFR has been calculated using the CKD EPI equation.     This calculation has not been validated in all clinical situations.     eGFR's persistently <90 mL/min signify possible Chronic Kidney     Disease.   Anion gap 17 (*) 5 - 15  PROTIME-INR     Status: None   Collection Time    09/12/14  4:45 AM      Result Value Ref Range   Prothrombin Time 14.3  11.6 - 15.2 seconds   INR 1.11  0.00 - 1.49  PRO B NATRIURETIC PEPTIDE     Status: Abnormal   Collection Time    09/12/14  4:45 AM      Result Value Ref Range   Pro B Natriuretic peptide (BNP) 5726.0 (*) 0 - 450 pg/mL  I-STAT TROPOININ, ED     Status: None   Collection Time    09/12/14  5:06 AM      Result Value Ref Range   Troponin i, poc 0.07  0.00 - 0.08 ng/mL   Comment 3            Comment: Due to the release kinetics of cTnI,     a negative result within the first hours     of the onset of symptoms does not rule out     myocardial infarction with certainty.     If myocardial infarction is still suspected,     repeat the test at appropriate intervals.  I-STAT CG4 LACTIC ACID, ED     Status: Abnormal   Collection Time    09/12/14  5:08 AM  Result Value Ref Range   Lactic Acid, Venous 2.69 (*) 0.5 - 2.2 mmol/L  URINALYSIS, ROUTINE W REFLEX MICROSCOPIC     Status: Abnormal   Collection Time    09/12/14  5:16 AM      Result Value Ref Range   Color, Urine AMBER (*) YELLOW   Comment: BIOCHEMICALS MAY BE AFFECTED BY COLOR    APPearance CLOUDY (*) CLEAR   Specific Gravity, Urine 1.027  1.005 - 1.030   pH 5.0  5.0 - 8.0   Glucose, UA NEGATIVE  NEGATIVE mg/dL   Hgb urine dipstick NEGATIVE  NEGATIVE   Bilirubin Urine SMALL (*) NEGATIVE   Ketones, ur 15 (*) NEGATIVE mg/dL   Protein, ur 30 (*) NEGATIVE mg/dL   Urobilinogen, UA 1.0  0.0 - 1.0 mg/dL   Nitrite NEGATIVE  NEGATIVE   Leukocytes, UA NEGATIVE  NEGATIVE  URINE MICROSCOPIC-ADD ON     Status: Abnormal   Collection Time    09/12/14  5:16 AM      Result Value Ref Range   WBC, UA 0-2  <3 WBC/hpf   RBC / HPF 0-2  <3 RBC/hpf   Bacteria, UA RARE  RARE   Casts HYALINE CASTS (*) NEGATIVE  I-STAT ARTERIAL BLOOD GAS, ED     Status: Abnormal   Collection Time    09/12/14  5:29 AM      Result Value Ref Range   pH, Arterial 7.312 (*) 7.350 - 7.450   pCO2 arterial 51.7 (*) 35.0 - 45.0 mmHg   pO2, Arterial 273.0 (*) 80.0 - 100.0 mmHg   Bicarbonate 26.1 (*) 20.0 - 24.0 mEq/L   TCO2 28  0 - 100 mmol/L   O2 Saturation 100.0     Patient temperature 98.6 F     Sample type ARTERIAL    I-STAT ARTERIAL BLOOD GAS, ED     Status: Abnormal   Collection Time    09/12/14  8:02 AM      Result Value Ref Range   pH, Arterial 7.348 (*) 7.350 - 7.450   pCO2 arterial 45.7 (*) 35.0 - 45.0 mmHg   pO2, Arterial 76.0 (*) 80.0 - 100.0 mmHg   Bicarbonate 25.1 (*) 20.0 - 24.0 mEq/L   TCO2 26  0 - 100 mmol/L   O2 Saturation 94.0     Acid-base deficit 1.0  0.0 - 2.0 mmol/L   Collection site RADIAL, ALLEN'S TEST ACCEPTABLE     Drawn by RT     Sample type ARTERIAL    MRSA PCR SCREENING     Status: None   Collection Time    09/12/14  9:02 AM      Result Value Ref Range   MRSA by PCR NEGATIVE  NEGATIVE   Comment:            The GeneXpert MRSA Assay (FDA     approved for NASAL specimens     only), is one component of a     comprehensive MRSA colonization     surveillance program. It is not     intended to diagnose MRSA     infection nor to guide or     monitor treatment for      MRSA infections.  LACTIC ACID, PLASMA     Status: None   Collection Time    09/12/14 10:04 AM      Result Value Ref Range   Lactic Acid, Venous 1.1  0.5 - 2.2 mmol/L  TROPONIN I  Status: Abnormal   Collection Time    09/12/14 10:04 AM      Result Value Ref Range   Troponin I 2.16 (*) <0.30 ng/mL   Comment:            Due to the release kinetics of cTnI,     a negative result within the first hours     of the onset of symptoms does not rule out     myocardial infarction with certainty.     If myocardial infarction is still suspected,     repeat the test at appropriate intervals.     CRITICAL VALUE NOTED.  VALUE IS CONSISTENT WITH PREVIOUSLY REPORTED AND CALLED VALUE.  TRIGLYCERIDES     Status: None   Collection Time    09/12/14 10:04 AM      Result Value Ref Range   Triglycerides 102  <150 mg/dL  CBC WITH DIFFERENTIAL     Status: Abnormal   Collection Time    09/12/14  1:40 PM      Result Value Ref Range   WBC 7.6  4.0 - 10.5 K/uL   RBC 2.72 (*) 4.22 - 5.81 MIL/uL   Hemoglobin 8.8 (*) 13.0 - 17.0 g/dL   HCT 27.2 (*) 39.0 - 52.0 %   MCV 100.0  78.0 - 100.0 fL   MCH 32.4  26.0 - 34.0 pg   MCHC 32.4  30.0 - 36.0 g/dL   RDW 15.6 (*) 11.5 - 15.5 %   Platelets 278  150 - 400 K/uL   Comment: RESULT REPEATED AND VERIFIED     DELTA CHECK NOTED   Neutrophils Relative % 97 (*) 43 - 77 %   Neutro Abs 7.3  1.7 - 7.7 K/uL   Lymphocytes Relative 2 (*) 12 - 46 %   Lymphs Abs 0.2 (*) 0.7 - 4.0 K/uL   Monocytes Relative 1 (*) 3 - 12 %   Monocytes Absolute 0.1  0.1 - 1.0 K/uL   Eosinophils Relative 0  0 - 5 %   Eosinophils Absolute 0.0  0.0 - 0.7 K/uL   Basophils Relative 0  0 - 1 %   Basophils Absolute 0.0  0.0 - 0.1 K/uL  LACTIC ACID, PLASMA     Status: None   Collection Time    09/12/14  2:00 PM      Result Value Ref Range   Lactic Acid, Venous 1.3  0.5 - 2.2 mmol/L  TROPONIN I     Status: Abnormal   Collection Time    09/12/14  4:18 PM      Result Value Ref Range    Troponin I 3.74 (*) <0.30 ng/mL   Comment:            Due to the release kinetics of cTnI,     a negative result within the first hours     of the onset of symptoms does not rule out     myocardial infarction with certainty.     If myocardial infarction is still suspected,     repeat the test at appropriate intervals.     CRITICAL VALUE NOTED.  VALUE IS CONSISTENT WITH PREVIOUSLY REPORTED AND CALLED VALUE.  LACTIC ACID, PLASMA     Status: None   Collection Time    09/12/14  7:15 PM      Result Value Ref Range   Lactic Acid, Venous 1.1  0.5 - 2.2 mmol/L  TROPONIN I     Status: Abnormal  Collection Time    09/12/14  7:15 PM      Result Value Ref Range   Troponin I 3.11 (*) <0.30 ng/mL   Comment:            Due to the release kinetics of cTnI,     a negative result within the first hours     of the onset of symptoms does not rule out     myocardial infarction with certainty.     If myocardial infarction is still suspected,     repeat the test at appropriate intervals.     CRITICAL VALUE NOTED.  VALUE IS CONSISTENT WITH PREVIOUSLY REPORTED AND CALLED VALUE.  HEPARIN LEVEL (UNFRACTIONATED)     Status: None   Collection Time    09/12/14  7:56 PM      Result Value Ref Range   Heparin Unfractionated 0.33  0.30 - 0.70 IU/mL   Comment:            IF HEPARIN RESULTS ARE BELOW     EXPECTED VALUES, AND PATIENT     DOSAGE HAS BEEN CONFIRMED,     SUGGEST FOLLOW UP TESTING     OF ANTITHROMBIN III LEVELS.  CBC     Status: Abnormal   Collection Time    09/13/14  2:30 AM      Result Value Ref Range   WBC 15.5 (*) 4.0 - 10.5 K/uL   RBC 1.99 (*) 4.22 - 5.81 MIL/uL   Hemoglobin 6.3 (*) 13.0 - 17.0 g/dL   Comment: REPEATED TO VERIFY     CRITICAL RESULT CALLED TO, READ BACK BY AND VERIFIED WITH:     T. WOODS,RN AT 5465 09/13/14 BY ZBEECH.   HCT 19.9 (*) 39.0 - 52.0 %   MCV 100.0  78.0 - 100.0 fL   MCH 31.7  26.0 - 34.0 pg   MCHC 31.7  30.0 - 36.0 g/dL   RDW 15.8 (*) 11.5 - 15.5 %    Platelets 374  150 - 400 K/uL  BASIC METABOLIC PANEL     Status: Abnormal   Collection Time    09/13/14  2:30 AM      Result Value Ref Range   Sodium 147  137 - 147 mEq/L   Potassium 4.9  3.7 - 5.3 mEq/L   Chloride 108  96 - 112 mEq/L   CO2 29  19 - 32 mEq/L   Glucose, Bld 213 (*) 70 - 99 mg/dL   BUN 45 (*) 6 - 23 mg/dL   Creatinine, Ser 1.83 (*) 0.50 - 1.35 mg/dL   Calcium 8.1 (*) 8.4 - 10.5 mg/dL   GFR calc non Af Amer 32 (*) >90 mL/min   GFR calc Af Amer 38 (*) >90 mL/min   Comment: (NOTE)     The eGFR has been calculated using the CKD EPI equation.     This calculation has not been validated in all clinical situations.     eGFR's persistently <90 mL/min signify possible Chronic Kidney     Disease.   Anion gap 10  5 - 15  HEPARIN LEVEL (UNFRACTIONATED)     Status: None   Collection Time    09/13/14  2:30 AM      Result Value Ref Range   Heparin Unfractionated 0.39  0.30 - 0.70 IU/mL   Comment:            IF HEPARIN RESULTS ARE BELOW     EXPECTED VALUES, AND PATIENT  DOSAGE HAS BEEN CONFIRMED,     SUGGEST FOLLOW UP TESTING     OF ANTITHROMBIN III LEVELS.  CBC     Status: Abnormal   Collection Time    09/13/14  4:17 AM      Result Value Ref Range   WBC 16.0 (*) 4.0 - 10.5 K/uL   RBC 1.86 (*) 4.22 - 5.81 MIL/uL   Hemoglobin 6.0 (*) 13.0 - 17.0 g/dL   Comment: REPEATED TO VERIFY     CRITICAL RESULT CALLED TO, READ BACK BY AND VERIFIED WITH:     T. WOODS,RN AT 0254 09/13/14 BY ZBEECH.   HCT 18.6 (*) 39.0 - 52.0 %   MCV 100.0  78.0 - 100.0 fL   MCH 32.3  26.0 - 34.0 pg   MCHC 32.3  30.0 - 36.0 g/dL   RDW 15.7 (*) 11.5 - 15.5 %   Platelets 361  150 - 400 K/uL  TYPE AND SCREEN     Status: None   Collection Time    09/13/14  4:17 AM      Result Value Ref Range   ABO/RH(D) O NEG     Antibody Screen NEG     Sample Expiration 09/16/2014     Unit Number [id number]     Blood Component Type RED CELLS,LR     Unit division 00     Status of Unit ISSUED     Transfusion  Status OK TO TRANSFUSE     Crossmatch Result Compatible     Unit Number [id number]     Blood Component Type RED CELLS,LR     Unit division 00     Status of Unit ALLOCATED     Transfusion Status OK TO TRANSFUSE     Crossmatch Result Compatible     Unit Number [id number]     Blood Component Type RED CELLS,LR     Unit division 00     Status of Unit ALLOCATED     Transfusion Status OK TO TRANSFUSE     Crossmatch Result Compatible     Unit Number [id number]     Blood Component Type RBC LR PHER1     Unit division 00     Status of Unit ALLOCATED     Transfusion Status OK TO TRANSFUSE     Crossmatch Result Compatible    PREPARE RBC (CROSSMATCH)     Status: None   Collection Time    09/13/14  6:30 AM      Result Value Ref Range   Order Confirmation ORDER PROCESSED BY BLOOD BANK    PREPARE RBC (CROSSMATCH)     Status: None   Collection Time    09/13/14  8:00 AM      Result Value Ref Range   Order Confirmation ORDER PROCESSED BY BLOOD BANK      Ct Abdomen Pelvis Wo Contrast  09/13/2014   CLINICAL DATA:  Rapidly decreasing hemoglobin. Worry for retroperitoneal bleed. On heparin.  EXAM: CT ABDOMEN AND PELVIS WITHOUT CONTRAST  TECHNIQUE: Multidetector CT imaging of the abdomen and pelvis was performed following the standard protocol without IV contrast.  COMPARISON:  04/26/2013  FINDINGS: Small a moderate size bilateral pleural effusions with basilar atelectasis. New since previous study.  Diffuse free fluid throughout the abdomen and pelvis. Increased density of the fluid is compatible with blood in the setting of decreasing hemoglobin. Heterogeneous areas of increased density within the fluid may indicate peritoneal implants or clot. Source of the hemorrhage cannot be ascertained. If  clinically indicated, IV contrast material enhanced scan may help to define a location of active hemorrhage. The mesenteric mass seen on previous study is not definitively identified today but could be  obscured by the diffuse fluid and decompressed bowel.  The unenhanced appearance of the liver, spleen, gallbladder, pancreas, adrenal glands, kidneys, inferior vena cava, and retroperitoneal lymph nodes is unremarkable. Calcification of aorta without aneurysm. No abnormal retroperitoneal fluid collections. Enteric tube with tip in the proximal duodenum. Stomach and small bowel are decompressed. No free air in the abdomen.  Pelvis: Foley catheter decompresses the bladder. There is a a right inguinal hernia containing fluid. No pelvic mass or lymphadenopathy is appreciated. Appendix is not identified. Diverticula in the sigmoid colon without evidence of diverticulitis. Degenerative changes in the spine. No destructive bone lesions appreciated.  IMPRESSION: Large amount of free fluid in the pelvis of increased density consistent with free intraperitoneal hemorrhage. Source of hemorrhage is not demonstrated on noncontrast imaging. Bilateral pleural effusions with basilar atelectasis also present. Right inguinal hernia containing fluid.  These results were called by telephone at the time of interpretation on 09/13/2014 at 7:04 am to Tvedt, the patient's nurse on the ICU, who verbally acknowledged these results.   Electronically Signed   By: Lucienne Capers M.D.   On: 09/13/2014 07:08   Dg Chest Port 1 View  09/12/2014   CLINICAL DATA:  Post intubation.  EXAM: PORTABLE CHEST - 1 VIEW  COMPARISON:  Chest radiograph May 28, 2013  FINDINGS: Endotracheal tube tip projects 2.2 cm above the carina. Nasogastric tube side port projecting at GE junction, distal tip not imaged.  The cardiac silhouette appears upper limits of normal in size, mediastinal silhouette is nonsuspicious, mildly calcified aortic knob. Central interstitial and alveolar airspace opacities. Small LEFT pleural effusion. No pneumothorax. Soft tissue planes and included osseous structures are nonsuspicious, mild degenerative change of thoracic spine.   IMPRESSION: Endotracheal tube tip projects 2.2 cm above the carina.  Borderline cardiomegaly. Central interstitial and alveolar prominence with small LEFT pleural effusion suggests acute pulmonary edema, less likely infectious process. Nasogastric tube side port projecting at GE junction, distal tip not imaged.   Electronically Signed   By: Elon Alas   On: 09/12/2014 05:50    Review of Systems  Unable to perform ROS: intubated   Blood pressure 83/45, pulse 108, temperature 98.9 F (37.2 C), temperature source Oral, resp. rate 16, height 5' 6.93" (1.7 m), weight 166 lb 7.2 oz (75.5 kg), SpO2 99.00%. Physical Exam  Constitutional: He appears well-developed. No distress.  HENT:  Head: Normocephalic.  Nose: Nose normal.  Mouth/Throat: Oropharynx is clear and moist. No oropharyngeal exudate.  ETT  Eyes: EOM are normal. Pupils are equal, round, and reactive to light. Right eye exhibits no discharge. Left eye exhibits no discharge. No scleral icterus.  Neck: Neck supple.  Cardiovascular: Normal rate and normal heart sounds.   Respiratory: No respiratory distress. He has no wheezes. He has no rales.  GI: He exhibits distension. There is no tenderness. There is no rebound and no guarding.  Distended but soft, no significant tenderness, easily reducible incisional hernia near umbilicus  Musculoskeletal:  Minimal edema  Neurological:  Awake on vent, follows commands  Skin: Skin is warm.    Assessment/Plan: Anticoagulation related spontaneous intra-abdominal hemorrhage. No clear identified source. Recommend transfusion and correction of coagulopathy. Such hemorrhages usually stop once anticoagulation is corrected. We will follow closely with you to make sure he stabilizes and improves.  Coleston Dirosa E  09/13/2014, 8:02 AM

## 2014-09-13 NOTE — Progress Notes (Signed)
Subjective:  Currently intubated, No c/o SOB or chest pain.  Transfused overnight with low Hgb and found to have intraperitoneal blood.  Objective:  Vital Signs in the last 24 hours: BP 106/46  Pulse 88  Temp(Src) 99.1 F (37.3 C) (Oral)  Resp 16  Ht 5' 6.93" (1.7 m)  Wt 75.5 kg (166 lb 7.2 oz)  BMI 26.12 kg/m2  SpO2 98%  Physical Exam:  Lungs:  Clear Cardiac:  Regular rhythm, normal S1 and S2, no S3 Abdomen:  Soft, nontender, no masses Extremities:  No edema present  Intake/Output from previous day: 09/25 0701 - 09/26 0700 In: 1156.1 [I.V.:781.1; Blood:50; NG/GT:325] Out: 2715 [Urine:2715]  Weight Filed Weights   09/12/14 0500 09/12/14 0800 09/13/14 0500  Weight: 77.1 kg (169 lb 15.6 oz) 77.5 kg (170 lb 13.7 oz) 75.5 kg (166 lb 7.2 oz)    Lab Results: Basic Metabolic Panel:  Recent Labs  09/12/14 0445 09/13/14 0230  NA 147 147  K 4.4 4.9  CL 106 108  CO2 24 29  GLUCOSE 156* 213*  BUN 32* 45*  CREATININE 1.30 1.83*   CBC:  Recent Labs  09/12/14 0445 09/12/14 1340 09/13/14 0230 09/13/14 0417  WBC 13.0* 7.6 15.5* 16.0*  NEUTROABS 8.2* 7.3  --   --   HGB 10.1* 8.8* 6.3* 6.0*  HCT 31.9* 27.2* 19.9* 18.6*  MCV 104.9* 100.0 100.0 100.0  PLT 406* 278 374 361   Cardiac Enzymes:  Recent Labs  09/12/14 1004 09/12/14 1618 09/12/14 1915  TROPONINI 2.16* 3.74* 3.11*    Telemetry: Sinus tachycardia  Assessment/Plan:  1. Demand ischemia due to hypotension and anemia 2. Recent bleed following biopsy for colonoscopy intraperitoneal ? Source 3. Acute renal failure 4. Pulmonary edema due to volume overload better  Rec:  Major issue is intraperitoneal blood.  Troponin elevation is likely demand due to known occlusion of the LAD.  Watch Hgb, transfuse as needed. Diurese as needed.  Will follow.         Kerry Hough  MD [facility]  09/13/2014, 10:55 AM

## 2014-09-13 NOTE — Progress Notes (Signed)
[facility] Progress Note Patient Name: [name] [name] DOB: 1929-05-06 MRN: [id number]   Date of Service  09/13/2014  HPI/Events of Note  Relative hypotension Aggressive diuresis  eICU Interventions  Bolus saline 500cc     Intervention Category Major Interventions: Hypotension - evaluation and management  MCQUAID, [name] 09/13/2014, 2:24 AM

## 2014-09-14 ENCOUNTER — Inpatient Hospital Stay (HOSPITAL_COMMUNITY): Payer: Medicare Other

## 2014-09-14 DIAGNOSIS — N179 Acute kidney failure, unspecified: Secondary | ICD-10-CM

## 2014-09-14 DIAGNOSIS — I9589 Other hypotension: Secondary | ICD-10-CM

## 2014-09-14 DIAGNOSIS — D6489 Other specified anemias: Secondary | ICD-10-CM

## 2014-09-14 LAB — CBC
HCT: 22 % — ABNORMAL LOW (ref 39.0–52.0)
Hemoglobin: 7.3 g/dL — ABNORMAL LOW (ref 13.0–17.0)
MCH: 30.9 pg (ref 26.0–34.0)
MCHC: 33.2 g/dL (ref 30.0–36.0)
MCV: 93.2 fL (ref 78.0–100.0)
Platelets: 276 10*3/uL (ref 150–400)
RBC: 2.36 MIL/uL — AB (ref 4.22–5.81)
RDW: 18 % — ABNORMAL HIGH (ref 11.5–15.5)
WBC: 14.9 10*3/uL — ABNORMAL HIGH (ref 4.0–10.5)

## 2014-09-14 LAB — GLUCOSE, CAPILLARY
GLUCOSE-CAPILLARY: 145 mg/dL — AB (ref 70–99)
Glucose-Capillary: 120 mg/dL — ABNORMAL HIGH (ref 70–99)
Glucose-Capillary: 129 mg/dL — ABNORMAL HIGH (ref 70–99)
Glucose-Capillary: 144 mg/dL — ABNORMAL HIGH (ref 70–99)

## 2014-09-14 LAB — PROCALCITONIN: Procalcitonin: 6.68 ng/mL

## 2014-09-14 LAB — MAGNESIUM: Magnesium: 2.2 mg/dL (ref 1.5–2.5)

## 2014-09-14 LAB — PHOSPHORUS: PHOSPHORUS: 5.5 mg/dL — AB (ref 2.3–4.6)

## 2014-09-14 LAB — BLOOD GAS, ARTERIAL
ACID-BASE DEFICIT: 0.3 mmol/L (ref 0.0–2.0)
Bicarbonate: 23.8 mEq/L (ref 20.0–24.0)
DRAWN BY: 13898
FIO2: 0.3 %
LHR: 16 {breaths}/min
O2 Saturation: 97 %
PEEP: 5 cmH2O
PH ART: 7.408 (ref 7.350–7.450)
Patient temperature: 98.6
TCO2: 25 mmol/L (ref 0–100)
VT: 500 mL
pCO2 arterial: 38.5 mmHg (ref 35.0–45.0)
pO2, Arterial: 91.8 mmHg (ref 80.0–100.0)

## 2014-09-14 LAB — HEMOGLOBIN AND HEMATOCRIT, BLOOD
HCT: 22.9 % — ABNORMAL LOW (ref 39.0–52.0)
HCT: 25.2 % — ABNORMAL LOW (ref 39.0–52.0)
HEMATOCRIT: 21 % — AB (ref 39.0–52.0)
Hemoglobin: 7.2 g/dL — ABNORMAL LOW (ref 13.0–17.0)
Hemoglobin: 7.7 g/dL — ABNORMAL LOW (ref 13.0–17.0)
Hemoglobin: 8.6 g/dL — ABNORMAL LOW (ref 13.0–17.0)

## 2014-09-14 LAB — COMPREHENSIVE METABOLIC PANEL
ALT: 12 U/L (ref 0–53)
AST: 15 U/L (ref 0–37)
Albumin: 2.4 g/dL — ABNORMAL LOW (ref 3.5–5.2)
Alkaline Phosphatase: 73 U/L (ref 39–117)
Anion gap: 14 (ref 5–15)
BILIRUBIN TOTAL: 1.4 mg/dL — AB (ref 0.3–1.2)
BUN: 61 mg/dL — ABNORMAL HIGH (ref 6–23)
CHLORIDE: 102 meq/L (ref 96–112)
CO2: 25 meq/L (ref 19–32)
CREATININE: 3.26 mg/dL — AB (ref 0.50–1.35)
Calcium: 7.7 mg/dL — ABNORMAL LOW (ref 8.4–10.5)
GFR, EST AFRICAN AMERICAN: 19 mL/min — AB (ref >90)
GFR, EST NON AFRICAN AMERICAN: 16 mL/min — AB (ref >90)
Glucose, Bld: 140 mg/dL — ABNORMAL HIGH (ref 70–99)
Potassium: 4.3 mEq/L (ref 3.7–5.3)
SODIUM: 141 meq/L (ref 137–147)
Total Protein: 5.2 g/dL — ABNORMAL LOW (ref 6.0–8.3)

## 2014-09-14 LAB — PREPARE RBC (CROSSMATCH)

## 2014-09-14 LAB — LACTIC ACID, PLASMA: Lactic Acid, Venous: 0.7 mmol/L (ref 0.5–2.2)

## 2014-09-14 MED ORDER — SODIUM CHLORIDE 0.9 % IV SOLN: Freq: Once | INTRAVENOUS | Status: DC

## 2014-09-14 MED ORDER — SODIUM CHLORIDE 0.9 % IV SOLN
Freq: Once | INTRAVENOUS | Status: AC
  Administered 2014-09-14: 11:00:00 via INTRAVENOUS

## 2014-09-14 MED ORDER — PIPERACILLIN-TAZOBACTAM IN DEX 2-0.25 GM/50ML IV SOLN
2.2500 g | Freq: Four times a day (QID) | INTRAVENOUS | Status: DC
  Administered 2014-09-15 – 2014-09-16 (×6): 2.25 g via INTRAVENOUS
  Filled 2014-09-14 (×7): qty 50

## 2014-09-14 NOTE — Progress Notes (Signed)
ANTIBIOTIC CONSULT NOTE - INITIAL  Pharmacy Consult for zosyn Indication: rule out sepsis  No Known Allergies  Patient Measurements: Height: 5' 6.93" (170 cm) Weight: 170 lb 10.2 oz (77.4 kg) IBW/kg (Calculated) : 65.94 Adjusted Body Weight:   Vital Signs: Temp: 100.6 F (38.1 C) (09/27 1927) BP: 132/51 mmHg (09/27 1927) Pulse Rate: 123 (09/27 1927) Intake/Output from previous day: 09/26 0701 - 09/27 0700 In: 2191.5 [I.V.:1560.2; Blood:620; NG/GT:11.3] Out: 160 [Urine:160] Intake/Output from this shift:    Labs:  Recent Labs  09/12/14 0445  09/13/14 0230 09/13/14 0417 09/13/14 1115 09/14/14 0317 09/14/14 0925 09/14/14 1407  WBC 13.0*  < > 15.5* 16.0* 17.7* 14.9*  --   --   HGB 10.1*  < > 6.3* 6.0* 9.1* 7.3* 7.2* 7.7*  PLT 406*  < > 374 361 376 276  --   --   CREATININE 1.30  --  1.83*  --   --  3.26*  --   --   < > = values in this interval not displayed. Estimated Creatinine Clearance: 16 ml/min (by C-G formula based on Cr of 3.26). No results found for this basename: VANCOTROUGH, VANCOPEAK, VANCORANDOM, [place], [place], Seth Ward, [place], [place], TOBRARND, AMIKACINPEAK, AMIKACINTROU, AMIKACIN,  in the last 72 hours   Microbiology: Recent Results (from the past 720 hour(s))  CULTURE, BLOOD (ROUTINE X 2)     Status: None   Collection Time    09/12/14  4:50 AM      Result Value Ref Range Status   Specimen Description BLOOD WRIST RIGHT   Final   Special Requests BOTTLES DRAWN AEROBIC ONLY 5ML   Final   Culture  Setup Time     Final   Value: 09/12/2014 09:08     Performed at [facility]   Culture     Final   Value:        BLOOD CULTURE RECEIVED NO GROWTH TO DATE CULTURE WILL BE HELD FOR 5 DAYS BEFORE ISSUING A FINAL NEGATIVE REPORT     Performed at [facility]   Report Status PENDING   Incomplete  URINE CULTURE     Status: None   Collection Time    09/12/14  5:16 AM      Result Value Ref Range Status   Specimen Description  URINE, CATHETERIZED   Final   Special Requests NONE   Final   Culture  Setup Time     Final   Value: 09/12/2014 09:28     Performed at [facility]     Final   Value: NO GROWTH     Performed at [facility]   Culture     Final   Value: NO GROWTH     Performed at [facility]   Report Status 09/13/2014 FINAL   Final  CULTURE, BLOOD (ROUTINE X 2)     Status: None   Collection Time    09/12/14  6:20 AM      Result Value Ref Range Status   Specimen Description BLOOD ARM RIGHT   Final   Special Requests BOTTLES DRAWN AEROBIC AND ANAEROBIC 4ML   Final   Culture  Setup Time     Final   Value: 09/12/2014 12:30     Performed at [facility]   Culture     Final   Value:        BLOOD CULTURE RECEIVED NO GROWTH TO DATE CULTURE WILL BE HELD FOR 5  DAYS BEFORE ISSUING A FINAL NEGATIVE REPORT     Performed at [facility]   Report Status PENDING   Incomplete  MRSA PCR SCREENING     Status: None   Collection Time    09/12/14  9:02 AM      Result Value Ref Range Status   MRSA by PCR NEGATIVE  NEGATIVE Final   Comment:            The GeneXpert MRSA Assay (FDA     approved for NASAL specimens     only), is one component of a     comprehensive MRSA colonization     surveillance program. It is not     intended to diagnose MRSA     infection nor to guide or     monitor treatment for     MRSA infections.    Medical History: Past Medical History  Diagnosis Date  . Hyperlipidemia     TAKES zOCOR DAILY  . Abdominal mass 02/2013  . UTI (urinary tract infection)   . Hypertension     TAKES LOTREL,HCTZ,AND METOPROLOL DAILY  . NSTEMI (non-ST elevated myocardial infarction) 02/2013    "LIGHT: ONE MD SAID HE DID AND ONE SAID HE DIDN'T  . Pneumonia     MAR 2014  . Stromal tumor of digestive system   . CAD (coronary artery disease)     Medications:  Scheduled:  . sodium chloride   Intravenous Once  . sodium chloride   Intravenous Once  .  antiseptic oral rinse  7 mL Mouth Rinse QID  . chlorhexidine  15 mL Mouth Rinse BID  . insulin aspart  2-6 Units Subcutaneous 6 times per day  . pantoprazole (PROTONIX) IV  40 mg Intravenous QHS  . simvastatin  20 mg Per Tube QPM   Infusions:  . norepinephrine (LEVOPHED) Adult infusion 4 mcg/min (09/14/14 1805)   Assessment: 80 yo male with sepsis will be started on zosyn.  SCr up to 3.26 (CrCl ~16).   Goal of Therapy:  Resolution of infection  Plan:  - zosyn 2.25 g iv q6h - f/u plan on antibiotic   Brighten Orndoff, Tsz-Yin 09/14/2014,9:38 PM

## 2014-09-14 NOTE — Progress Notes (Signed)
Subjective:  Currently intubated, No c/o SOB or chest pain.  Appears alert and follows commands  Objective:  Vital Signs in the last 24 hours: BP 100/66  Pulse 106  Temp(Src) 99.5 F (37.5 C) (Oral)  Resp 16  Ht 5' 6.93" (1.7 m)  Wt 77.4 kg (170 lb 10.2 oz)  BMI 26.78 kg/m2  SpO2 96%  Physical Exam: Elderly WM in NAD intubated Lungs:  Clear Cardiac:  Rapid, regular rhythm, normal S1 and S2, no S3 Abdomen:  Soft, nontender, no masses, mildly distented Extremities:  No edema present  Intake/Output from previous day: 09/26 0701 - 09/27 0700 In: 2191.5 [I.V.:1560.2; Blood:620; NG/GT:11.3] Out: 160 [Urine:160]  Weight Filed Weights   09/12/14 0800 09/13/14 0500 09/14/14 0500  Weight: 77.5 kg (170 lb 13.7 oz) 75.5 kg (166 lb 7.2 oz) 77.4 kg (170 lb 10.2 oz)    Lab Results: Basic Metabolic Panel:  Recent Labs  09/13/14 0230 09/14/14 0317  NA 147 141  K 4.9 4.3  CL 108 102  CO2 29 25  GLUCOSE 213* 140*  BUN 45* 61*  CREATININE 1.83* 3.26*   CBC:  Recent Labs  09/12/14 0445 09/12/14 1340  09/13/14 1115 09/14/14 0317  WBC 13.0* 7.6  < > 17.7* 14.9*  NEUTROABS 8.2* 7.3  --   --   --   HGB 10.1* 8.8*  < > 9.1* 7.3*  HCT 31.9* 27.2*  < > 26.8* 22.0*  MCV 104.9* 100.0  < > 93.1 93.2  PLT 406* 278  < > 376 276  < > = values in this interval not displayed. Cardiac Enzymes:  Recent Labs  09/12/14 1618 09/12/14 1915 09/13/14 1115  TROPONINI 3.74* 3.11* 1.93*    Telemetry: Sinus tachycardia, occasional PVC's  Assessment/Plan:  1. Demand ischemia due to hypotension and anemia 2. Recent bleed following biopsy for colonoscopy intraperitoneal ? Source 3. Acute renal failure worse today 4. Pulmonary edema due to volume overload better  Rec:  Major issue is intraperitoneal blood.  Troponin elevation is likely demand due to known occlusion of the LAD.  Watch Hgb, transfuse as needed. Diurese as needed.   Hopefully extubate today.  Will need to restart beta  blocker IV if not extubated.         Kerry Hough  MD [facility]  09/14/2014, 8:02 AM

## 2014-09-14 NOTE — Progress Notes (Signed)
[facility] Progress Note Patient Name: [name] [name] DOB: 1929-06-09 MRN: [id number]   Date of Service  09/14/2014  HPI/Events of Note  Leukocytosis, fever, elevated PCT  eICU Interventions  abx started     Intervention Category Intermediate Interventions: OtherSHIVEN, JUNIOUS, Mamie Nick 09/14/2014, 9:37 PM

## 2014-09-14 NOTE — Progress Notes (Signed)
Subjective: Pt con't with no abd pain.  No bowel function.  No blood per rectum  Objective: Vital signs in last 24 hours: Temp:  [98.9 F (37.2 C)-99.7 F (37.6 C)] 99.5 F (37.5 C) (09/27 0730) Pulse Rate:  [88-126] 106 (09/27 0730) Resp:  [15-22] 16 (09/27 0730) BP: (66-125)/(35-91) 100/66 mmHg (09/27 0730) SpO2:  [95 %-100 %] 96 % (09/27 0730) FiO2 (%):  [30 %] 30 % (09/27 0720) Weight:  [170 lb 10.2 oz (77.4 kg)] 170 lb 10.2 oz (77.4 kg) (09/27 0500)    Intake/Output from previous day: 09/26 0701 - 09/27 0700 In: 2191.5 [I.V.:1560.2; Blood:620; NG/GT:11.3] Out: 160 [Urine:160] Intake/Output this shift:    General appearance: alert and cooperative Resp: clear to auscultation bilaterally Cardio: Tachy, RR, on pressors GI: soft, nttp, no rebound/guarding  Lab Results:   Recent Labs  09/13/14 1115 09/14/14 0317  WBC 17.7* 14.9*  HGB 9.1* 7.3*  HCT 26.8* 22.0*  PLT 376 276   BMET  Recent Labs  09/13/14 0230 09/14/14 0317  NA 147 141  K 4.9 4.3  CL 108 102  CO2 29 25  GLUCOSE 213* 140*  BUN 45* 61*  CREATININE 1.83* 3.26*  CALCIUM 8.1* 7.7*   PT/INR  Recent Labs  09/12/14 0445 09/13/14 1115  LABPROT 14.3 15.0  INR 1.11 1.18   ABG  Recent Labs  09/12/14 0802 09/14/14 0426  PHART 7.348* 7.408  HCO3 25.1* 23.8    Studies/Results: Ct Abdomen Pelvis Wo Contrast  09/13/2014   CLINICAL DATA:  Rapidly decreasing hemoglobin. Worry for retroperitoneal bleed. On heparin.  EXAM: CT ABDOMEN AND PELVIS WITHOUT CONTRAST  TECHNIQUE: Multidetector CT imaging of the abdomen and pelvis was performed following the standard protocol without IV contrast.  COMPARISON:  04/26/2013  FINDINGS: Small a moderate size bilateral pleural effusions with basilar atelectasis. New since previous study.  Diffuse free fluid throughout the abdomen and pelvis. Increased density of the fluid is compatible with blood in the setting of decreasing hemoglobin. Heterogeneous areas of  increased density within the fluid may indicate peritoneal implants or clot. Source of the hemorrhage cannot be ascertained. If clinically indicated, IV contrast material enhanced scan may help to define a location of active hemorrhage. The mesenteric mass seen on previous study is not definitively identified today but could be obscured by the diffuse fluid and decompressed bowel.  The unenhanced appearance of the liver, spleen, gallbladder, pancreas, adrenal glands, kidneys, inferior vena cava, and retroperitoneal lymph nodes is unremarkable. Calcification of aorta without aneurysm. No abnormal retroperitoneal fluid collections. Enteric tube with tip in the proximal duodenum. Stomach and small bowel are decompressed. No free air in the abdomen.  Pelvis: Foley catheter decompresses the bladder. There is a a right inguinal hernia containing fluid. No pelvic mass or lymphadenopathy is appreciated. Appendix is not identified. Diverticula in the sigmoid colon without evidence of diverticulitis. Degenerative changes in the spine. No destructive bone lesions appreciated.  IMPRESSION: Large amount of free fluid in the pelvis of increased density consistent with free intraperitoneal hemorrhage. Source of hemorrhage is not demonstrated on noncontrast imaging. Bilateral pleural effusions with basilar atelectasis also present. Right inguinal hernia containing fluid.  These results were called by telephone at the time of interpretation on 09/13/2014 at 7:04 am to Tvedt, the patient's nurse on the ICU, who verbally acknowledged these results.   Electronically Signed   By: Lucienne Capers M.D.   On: 09/13/2014 07:08   Dg Chest Port 1 View  09/14/2014  CLINICAL DATA:  Evaluate endotracheal tube  EXAM: PORTABLE CHEST - 1 VIEW  COMPARISON:  Portable exam 0520 hr compared to 09/13/2014  FINDINGS: Tip of endotracheal tube projects approximately 2.6 cm above carina.  Nasogastric tube extends into stomach.  RIGHT jugular central  venous catheter tip projects over cavoatrial junction.  Borderline enlargement of cardiac silhouette.  Mediastinal contours and pulmonary vascularity normal.  Bibasilar atelectasis.  No definite pulmonary infiltrate, pleural effusion or pneumothorax.  No acute osseous findings.  IMPRESSION: Bibasilar atelectasis slightly greater on LEFT.   Electronically Signed   By: Lavonia Dana M.D.   On: 09/14/2014 07:38   Dg Chest Port 1 View  09/13/2014   CLINICAL DATA:  Right IJ placement .  EXAM: PORTABLE CHEST - 1 VIEW  COMPARISON:  09/13/2014.  FINDINGS: Endotracheal tube and NG tube noted in stable position. Right IJ line noted in good anatomic position. Mediastinum and hilar structures normal. Basilar atelectasis. No pleural effusion or pneumothorax. No acute bony abnormality.  IMPRESSION: 1. Endotracheal tube and NG tube in stable position. Interim placement of a right IJ line, its tip is in good anatomic position. 2. Bibasilar atelectasis and/or mild infiltrates.   Electronically Signed   By: [place]   On: 09/13/2014 16:32   Dg Chest Port 1 View  09/13/2014   CLINICAL DATA:  Intubation.  EXAM: PORTABLE CHEST - 1 VIEW  COMPARISON:  09/12/2014.  FINDINGS: Endotracheal tube 1.6 cm above the carina. NG tube noted with tip below hemidiaphragms. Mediastinum hilar structures are normal. Interim near complete clearing of bilateral pulmonary edema. Bibasilar atelectasis. Small pleural effusions noted. No pneumothorax. Heart size stable. Pulmonary vascularity normal.  IMPRESSION: 1. Endotracheal tube tip 1.6 cm above the carina. NG tube tip below left hemidiaphragm.  2. Interim near complete clearing of bilateral pulmonary edema. Mild bibasilar atelectasis and small pleural effusions noted.   Electronically Signed   By: Marcello Moores  Register   On: 09/13/2014 09:18    Anti-infectives: Anti-infectives   None      Assessment/Plan: Anticoagulation related spontaneous intra-abdominal hemorrhage -Still unsure of  source.  Hgb trending down after transfusion on this AM's labs.  Coag corrected -no abd pain, will hold off on any surgery for now.  If con't to hemorrhage, pt may require ex lap.  LOS: 2 days    Rosario Jacks., [facility] 09/14/2014

## 2014-09-14 NOTE — Progress Notes (Addendum)
PULMONARY / CRITICAL CARE MEDICINE   Name: [name] [name] MRN: [id number] DOB: 1929-03-13    ADMISSION DATE:  09/12/2014 CONSULTATION DATE:  09/12/2014  REFERRING MD :  EDP  CHIEF COMPLAINT:  SOB  INITIAL PRESENTATION:  79 year old male presented to [facility] ED 9/25 with acute onset SOB. Profoundly hypoxic in ED with sat 50%. Was intubated in ED. CXR consistent with edema.  PCCM to admit.   STUDIES:  ECHO 9/25>>>The estimated ejection fraction was 50%. There is hypokinesis of the anteroseptal myocardium.PA peak pressure: 70 mm Hg (S).  SIGNIFICANT EVENTS: 9/25 admitted. + NSTEMI vs demand ischemia and pulmonary edema. Felt r/t volume overload from recent prep for colonoscopy.  Treated w/ ASA, beta blocker, heparin and statin.  9/26: acute hgb drop. 8.8-->6.0. CT abd positive for RPB. Heparin stopped. Got 2 units PRBC for  Hemorrhagic shock, required levophed for support. Heparin gtt stopped. Surgery following.   9/27: weaning pressors. Still on vent. Hgb drifted again from 9.1-->7.3. No abd pain. Worsening renal faiure - creat 3s  SUBJECTIVE:  . Worsening renal failure. Cards suspects demand ischemia. PRBC given in afternoon. Running temps. On prn sedation  VITAL SIGNS: Temp:  [99 F (37.2 C)-99.7 F (37.6 C)] 99.6 F (37.6 C) (09/27 0915) Pulse Rate:  [88-126] 125 (09/27 0915) Resp:  [15-22] 20 (09/27 0915) BP: (66-127)/(35-91) 127/47 mmHg (09/27 0915) SpO2:  [95 %-100 %] 96 % (09/27 0915) FiO2 (%):  [30 %] 30 % (09/27 0720) Weight:  [77.4 kg (170 lb 10.2 oz)] 77.4 kg (170 lb 10.2 oz) (09/27 0500) HEMODYNAMICS:   VENTILATOR SETTINGS: Vent Mode:  [-] PSV;CPAP FiO2 (%):  [30 %] 30 % Set Rate:  [16 bmp] 16 bmp Vt Set:  [500 mL] 500 mL PEEP:  [5 cmH20] 5 cmH20 Pressure Support:  [5 cmH20] 5 cmH20 Plateau Pressure:  [17 [id number] cmH20] 21 cmH20 INTAKE / OUTPUT:  Intake/Output Summary (Last 24 hours) at 09/14/14 1478 Last data filed at 09/14/14 0700  Gross per 24 hour  Intake  1845.01 ml  Output    160 ml  Net 1685.01 ml    PHYSICAL EXAMINATION: General:  Elderly male on vent in NAD Neuro:  Prn sedation : . Follows commands to W[facility] (CAM-ICU neg for delirum). RASS 0 HEENT:  [place]/AT, PERRL Cardiovascular:  RRR, 3/6 SEM Lungs:  occ rhonchi that cl w/ suction  Abdomen:  Distended, normal appearance per wife, 2 hernias. pender to palp  Musculoskeletal:  No acute deformity, +2 pitting edema to mid shin  Skin:  Intact  LABS: PULMONARY  Recent Labs Lab 09/12/14 0529 09/12/14 0802 09/14/14 0426  PHART 7.312* 7.348* 7.408  PCO2ART 51.7* 45.7* 38.5  PO2ART 273.0* 76.0* 91.8  HCO3 26.1* 25.1* 23.8  TCO2 28 26 25.0  O2SAT 100.0 94.0 97.0    CBC  Recent Labs Lab 09/13/14 0417 09/13/14 1115 09/14/14 0317  HGB 6.0* 9.1* 7.3*  HCT 18.6* 26.8* 22.0*  WBC 16.0* 17.7* 14.9*  PLT 361 376 276    COAGULATION  Recent Labs Lab 09/12/14 0445 09/13/14 1115  INR 1.11 1.18    CARDIAC    Recent Labs Lab 09/12/14 1004 09/12/14 1618 09/12/14 1915 09/13/14 1115  TROPONINI 2.16* 3.74* 3.11* 1.93*    Recent Labs Lab 09/12/14 0445  PROBNP 5726.0*     CHEMISTRY  Recent Labs Lab 09/12/14 0445 09/13/14 0230 09/14/14 0317  NA 147 147 141  K 4.4 4.9 4.3  CL 106 108 102  CO2 24 29 25  GLUCOSE 156* 213* 140*  BUN 32* 45* 61*  CREATININE 1.30 1.83* 3.26*  CALCIUM 8.8 8.1* 7.7*  MG  --   --  2.2  PHOS  --   --  5.5*   Estimated Creatinine Clearance: 16 ml/min (by C-G formula based on Cr of 3.26).   LIVER  Recent Labs Lab 09/12/14 0445 09/13/14 1115 09/14/14 0317  AST 40*  --  15  ALT 22  --  12  ALKPHOS 138*  --  73  BILITOT 1.3*  --  1.4*  PROT 7.5  --  5.2*  ALBUMIN 3.5  --  2.4*  INR 1.11 1.18  --      INFECTIOUS  Recent Labs Lab 09/12/14 1915 09/13/14 1115 09/14/14 0317  LATICACIDVEN 1.1 1.3 0.7     ENDOCRINE CBG (last 3)   Recent Labs  09/13/14 2343 09/14/14 0351 09/14/14 0710  GLUCAP 120* 129* 144*     IMAGING x 48h Ct Abdomen Pelvis Wo Contrast  09/13/2014   CLINICAL DATA:  Rapidly decreasing hemoglobin. Worry for retroperitoneal bleed. On heparin.  EXAM: CT ABDOMEN AND PELVIS WITHOUT CONTRAST  TECHNIQUE: Multidetector CT imaging of the abdomen and pelvis was performed following the standard protocol without IV contrast.  COMPARISON:  04/26/2013  FINDINGS: Small a moderate size bilateral pleural effusions with basilar atelectasis. New since previous study.  Diffuse free fluid throughout the abdomen and pelvis. Increased density of the fluid is compatible with blood in the setting of decreasing hemoglobin. Heterogeneous areas of increased density within the fluid may indicate peritoneal implants or clot. Source of the hemorrhage cannot be ascertained. If clinically indicated, IV contrast material enhanced scan may help to define a location of active hemorrhage. The mesenteric mass seen on previous study is not definitively identified today but could be obscured by the diffuse fluid and decompressed bowel.  The unenhanced appearance of the liver, spleen, gallbladder, pancreas, adrenal glands, kidneys, inferior vena cava, and retroperitoneal lymph nodes is unremarkable. Calcification of aorta without aneurysm. No abnormal retroperitoneal fluid collections. Enteric tube with tip in the proximal duodenum. Stomach and small bowel are decompressed. No free air in the abdomen.  Pelvis: Foley catheter decompresses the bladder. There is a a right inguinal hernia containing fluid. No pelvic mass or lymphadenopathy is appreciated. Appendix is not identified. Diverticula in the sigmoid colon without evidence of diverticulitis. Degenerative changes in the spine. No destructive bone lesions appreciated.  IMPRESSION: Large amount of free fluid in the pelvis of increased density consistent with free intraperitoneal hemorrhage. Source of hemorrhage is not demonstrated on noncontrast imaging. Bilateral pleural effusions  with basilar atelectasis also present. Right inguinal hernia containing fluid.  These results were called by telephone at the time of interpretation on 09/13/2014 at 7:04 am to Tvedt, the patient's nurse on the ICU, who verbally acknowledged these results.   Electronically Signed   By: Lucienne Capers M.D.   On: 09/13/2014 07:08   Dg Chest Port 1 View  09/14/2014   CLINICAL DATA:  Evaluate endotracheal tube  EXAM: PORTABLE CHEST - 1 VIEW  COMPARISON:  Portable exam 0520 hr compared to 09/13/2014  FINDINGS: Tip of endotracheal tube projects approximately 2.6 cm above carina.  Nasogastric tube extends into stomach.  RIGHT jugular central venous catheter tip projects over cavoatrial junction.  Borderline enlargement of cardiac silhouette.  Mediastinal contours and pulmonary vascularity normal.  Bibasilar atelectasis.  No definite pulmonary infiltrate, pleural effusion or pneumothorax.  No acute osseous findings.  IMPRESSION: Bibasilar atelectasis  slightly greater on LEFT.   Electronically Signed   By: Lavonia Dana M.D.   On: 09/14/2014 07:38   Dg Chest Port 1 View  09/13/2014   CLINICAL DATA:  Right IJ placement .  EXAM: PORTABLE CHEST - 1 VIEW  COMPARISON:  09/13/2014.  FINDINGS: Endotracheal tube and NG tube noted in stable position. Right IJ line noted in good anatomic position. Mediastinum and hilar structures normal. Basilar atelectasis. No pleural effusion or pneumothorax. No acute bony abnormality.  IMPRESSION: 1. Endotracheal tube and NG tube in stable position. Interim placement of a right IJ line, its tip is in good anatomic position. 2. Bibasilar atelectasis and/or mild infiltrates.   Electronically Signed   By: [place]   On: 09/13/2014 16:32   Dg Chest Port 1 View  09/13/2014   CLINICAL DATA:  Intubation.  EXAM: PORTABLE CHEST - 1 VIEW  COMPARISON:  09/12/2014.  FINDINGS: Endotracheal tube 1.6 cm above the carina. NG tube noted with tip below hemidiaphragms. Mediastinum hilar structures are  normal. Interim near complete clearing of bilateral pulmonary edema. Bibasilar atelectasis. Small pleural effusions noted. No pneumothorax. Heart size stable. Pulmonary vascularity normal.  IMPRESSION: 1. Endotracheal tube tip 1.6 cm above the carina. NG tube tip below left hemidiaphragm.  2. Interim near complete clearing of bilateral pulmonary edema. Mild bibasilar atelectasis and small pleural effusions noted.   Electronically Signed   By: Marcello Moores  Register   On: 09/13/2014 09:18       ASSESSMENT / PLAN:  PULMONARY OETT 9/25 >>> A: Acute hypoxemic/hypercarbic respiratory failure in setting of edema Pulmonary edema-->improved w/ positive pressure  H/o COPD Basilar atx on CXR. Tolerating PSV well. Passed SBT. Reluctant to extubate w/ tachycardia and 2 gm hgb drop from yesterday. Also still on pressors.   P:   Resume full vent support. If hemodynamically stable and CBC holding may re-consider extubation later this afternoon Follow CXR, ABG VAP prevention Daily SBT  CARDIOVASCULAR CVL A:  Hypotension - initially propofol related. On 9/26 precipitated by blood loss and hypovolemic/hemorrhagic shock  Acute CHF (LVEF 55% with grade 1 DD 02/2013) NSTEMI/demand ischemia  Still pressor dependant and tachycardic. His CEs are trending down.  P:  Wean pressors for MAP >65 Hold amlodipine, benazepril, HCTZ, metoprolol Have stopped fentanyl gtt. After he is off levophed will start b-blocker. Need to avoid hypotension given AKI  Continue preadmission statin D/c asa  Hold lasix   RENAL A:   AKIN stage 3 or Acute Renal Failure in setting of hypoperfusion   P:   Avoid hypotension Renal dose meds Avoid nehprotoxics (currently on none) F/u chemistry   GASTROINTESTINAL A:   Elevated LFTs-->improved    - no active bleeding but still hypotensive and hgb not tremondously rresponsive to prbc  P:   SUP ppx: IV protonix NPO  Will hold off for tube feeds  now as surgery considering X-lap  if hgb cont to drop   HEMATOLOGIC A:   Anemia Acute blood loss anemia in setting of spontaneous Retroperitoneal hemorrhage on heparin     - got 1 unit  PRBC 09/14/14 P:  PAS Trend CBC Transfuse for hgb <8 (in view of recent NSTEMI) Or symptomatic anemia    INFECTIOUS A:   Leukocytosis, pressor dependent, spiking fevers  P:   BCx2 9/25 >>> UC 9/25 >>> Check PCT 09/14/14 Start empiric zosyn if PCT high  ENDOCRINE A:   No acute issues   P:   Follow glucose on chemistry  NEUROLOGIC  A:   Acute encephalopathy    - normal [facility] 09/13/14  P:   RASS goal: -1 Fentanyl gtt PAD protocol Daily [facility]  FAMILY -  Updates: pdated last on: 9/27 at the bedside by NP -  By Day 7:  Interdisciplinary Family Meeting v Palliative Care Meeting on:  N/A     TODAYS STAFF MD [facility] Critically ill. Think sequence of events: colonoscopy-->pulmonary edema/demand ischemia (from colonoscopy prep and vol overload)-->resp failure-->hemorrhagic shock from RPB on heparin gtt.Marland KitchenMarland KitchenHave stopped heparin. Cards and Surgery are following. Remains pressor dependant. Still tachycardic w/ worsening creatinine. Will repeat CBC. Likely transfuse to keep hgb > 8gm%. Avoid extubation 09/14/14.    Marni Griffon ACNP 09/14/2014, 9:38 AM    I have personally obtained a history, examined the patient, evaluated laboratory and imaging results, formulated the assessment and plan and placed orders. CRITICAL CARE: The patient is critically ill with multiple organ systems failure and requires high complexity decision making for assessment and support, frequent evaluation and titration of therapies, application of advanced monitoring technologies and extensive interpretation of multiple databases. Critical Care Time devoted to patient care services described in this note is 35 min   Dr. Brand Males, M.D., [facility].C.P Pulmonary and Critical Care Medicine Staff Physician [place] [facility]  [facility] Pager: [phone], If no answer or between  15:00h - 7:00h: call [phone]  09/14/2014 2:11 PM

## 2014-09-15 ENCOUNTER — Inpatient Hospital Stay (HOSPITAL_COMMUNITY): Payer: Medicare Other

## 2014-09-15 DIAGNOSIS — N170 Acute kidney failure with tubular necrosis: Secondary | ICD-10-CM

## 2014-09-15 LAB — CBC
HCT: 24.5 % — ABNORMAL LOW (ref 39.0–52.0)
Hemoglobin: 8.2 g/dL — ABNORMAL LOW (ref 13.0–17.0)
MCH: 30.5 pg (ref 26.0–34.0)
MCHC: 33.5 g/dL (ref 30.0–36.0)
MCV: 91.1 fL (ref 78.0–100.0)
PLATELETS: 242 10*3/uL (ref 150–400)
RBC: 2.69 MIL/uL — AB (ref 4.22–5.81)
RDW: 17.5 % — AB (ref 11.5–15.5)
WBC: 10.1 10*3/uL (ref 4.0–10.5)

## 2014-09-15 LAB — COMPREHENSIVE METABOLIC PANEL
ALBUMIN: 2.2 g/dL — AB (ref 3.5–5.2)
ALT: 11 U/L (ref 0–53)
ANION GAP: 13 (ref 5–15)
AST: 13 U/L (ref 0–37)
Alkaline Phosphatase: 70 U/L (ref 39–117)
BUN: 67 mg/dL — ABNORMAL HIGH (ref 6–23)
CO2: 25 mEq/L (ref 19–32)
Calcium: 7.7 mg/dL — ABNORMAL LOW (ref 8.4–10.5)
Chloride: 103 mEq/L (ref 96–112)
Creatinine, Ser: 2.65 mg/dL — ABNORMAL HIGH (ref 0.50–1.35)
GFR calc Af Amer: 24 mL/min — ABNORMAL LOW (ref >90)
GFR calc non Af Amer: 21 mL/min — ABNORMAL LOW (ref >90)
Glucose, Bld: 118 mg/dL — ABNORMAL HIGH (ref 70–99)
POTASSIUM: 3.9 meq/L (ref 3.7–5.3)
Sodium: 141 mEq/L (ref 137–147)
Total Bilirubin: 3.1 mg/dL — ABNORMAL HIGH (ref 0.3–1.2)
Total Protein: 5.1 g/dL — ABNORMAL LOW (ref 6.0–8.3)

## 2014-09-15 LAB — GLUCOSE, CAPILLARY
GLUCOSE-CAPILLARY: 118 mg/dL — AB (ref 70–99)
GLUCOSE-CAPILLARY: 119 mg/dL — AB (ref 70–99)
GLUCOSE-CAPILLARY: 124 mg/dL — AB (ref 70–99)
GLUCOSE-CAPILLARY: 137 mg/dL — AB (ref 70–99)
GLUCOSE-CAPILLARY: 145 mg/dL — AB (ref 70–99)
Glucose-Capillary: 159 mg/dL — ABNORMAL HIGH (ref 70–99)
Glucose-Capillary: 125 mg/dL — ABNORMAL HIGH (ref 70–99)
Glucose-Capillary: 119 mg/dL — ABNORMAL HIGH (ref 70–99)
Glucose-Capillary: 143 mg/dL — ABNORMAL HIGH (ref 70–99)

## 2014-09-15 LAB — BLOOD GAS, ARTERIAL
Acid-Base Excess: 0 mmol/L (ref 0.0–2.0)
Bicarbonate: 24 mEq/L (ref 20.0–24.0)
Drawn by: 13898
FIO2: 0.3 %
O2 SAT: 97.4 %
PATIENT TEMPERATURE: 99.9
PEEP/CPAP: 5 cmH2O
PH ART: 7.406 (ref 7.350–7.450)
RATE: 16 resp/min
TCO2: 25.2 mmol/L (ref 0–100)
VT: 500 mL
pCO2 arterial: 39.3 mmHg (ref 35.0–45.0)
pO2, Arterial: 101 mmHg — ABNORMAL HIGH (ref 80.0–100.0)

## 2014-09-15 LAB — TRIGLYCERIDES: TRIGLYCERIDES: 123 mg/dL (ref <150)

## 2014-09-15 LAB — MAGNESIUM: MAGNESIUM: 2.4 mg/dL (ref 1.5–2.5)

## 2014-09-15 LAB — PHOSPHORUS: Phosphorus: 4.2 mg/dL (ref 2.3–4.6)

## 2014-09-15 LAB — PROCALCITONIN: PROCALCITONIN: 4.02 ng/mL

## 2014-09-15 MED ORDER — CETYLPYRIDINIUM CHLORIDE 0.05 % MT LIQD
7.0000 mL | Freq: Two times a day (BID) | OROMUCOSAL | Status: DC
  Administered 2014-09-15 – 2014-09-20 (×8): 7 mL via OROMUCOSAL

## 2014-09-15 MED ORDER — ACETAMINOPHEN 325 MG PO TABS
650.0000 mg | ORAL_TABLET | Freq: Four times a day (QID) | ORAL | Status: DC | PRN
  Administered 2014-09-15: 650 mg via ORAL
  Filled 2014-09-15: qty 2

## 2014-09-15 MED ORDER — INSULIN ASPART 100 UNIT/ML ~~LOC~~ SOLN: 0.0000 [IU] | Freq: Every day | SUBCUTANEOUS | Status: DC

## 2014-09-15 MED ORDER — INSULIN ASPART 100 UNIT/ML ~~LOC~~ SOLN
0.0000 [IU] | Freq: Three times a day (TID) | SUBCUTANEOUS | Status: DC
  Administered 2014-09-16: 2 [IU] via SUBCUTANEOUS

## 2014-09-15 NOTE — Progress Notes (Signed)
Medicare Important Message given?  YES (If response is "NO", the following Medicare IM given date fields will be blank) Date Medicare IM given:  09/15/2014 Medicare IM given by:  [facility]

## 2014-09-15 NOTE — Progress Notes (Signed)
Subjective:  Extubated; denies dyspnea or chest pain  Objective:  Vital Signs in the last 24 hours: BP 107/49  Pulse 105  Temp(Src) 100.1 F (37.8 C) (Core (Comment))  Resp 25  Ht 5' 6.93" (1.7 m)  Wt 170 lb 13.7 oz (77.5 kg)  BMI 26.82 kg/m2  SpO2 99%  Physical Exam: Elderly WM in NAD Neck: supple Lungs:  Diminished BS bases Cardiac:  Tachycardic, regular Abdomen:  Distended, not tender, hypoactive BS Extremities:  1-2+ edema present  Intake/Output from previous day: 09/27 0701 - 09/28 0700 In: 1493.9 [I.V.:743.9; Blood:620; NG/GT:30; IV Piggyback:100] Out: 935 [Urine:935]  Weight Filed Weights   09/13/14 0500 09/14/14 0500 09/15/14 0600  Weight: 166 lb 7.2 oz (75.5 kg) 170 lb 10.2 oz (77.4 kg) 170 lb 13.7 oz (77.5 kg)    Lab Results: Basic Metabolic Panel:  Recent Labs  09/14/14 0317 09/15/14 0629  NA 141 141  K 4.3 3.9  CL 102 103  CO2 25 25  GLUCOSE 140* 118*  BUN 61* 67*  CREATININE 3.26* 2.65*   CBC:  Recent Labs  09/12/14 1340  09/14/14 0317  09/14/14 2139 09/15/14 0629  WBC 7.6  < > 14.9*  --   --  10.1  NEUTROABS 7.3  --   --   --   --   --   HGB 8.8*  < > 7.3*  < > 8.6* 8.2*  HCT 27.2*  < > 22.0*  < > 25.2* 24.5*  MCV 100.0  < > 93.2  --   --  91.1  PLT 278  < > 276  --   --  242  < > = values in this interval not displayed. Cardiac Enzymes:  Recent Labs  09/12/14 1618 09/12/14 1915 09/13/14 1115  TROPONINI 3.74* 3.11* 1.93*    Telemetry: Sinus tachycardia, occasional PVC's  Assessment/Plan:  1. Non-ST elevation myocardial infarction-felt to be related to demand ischemia in the setting of respiratory failure/volume excess. Continue statin. Add low-dose metoprolol later if blood pressure allows. Levophed was weaned earlier today. No aspirin given recent bleed. 2. Acute blood loss anemia/recent bleed- felt secondary to heparin and spontaneous peritoneal hemorrhage. Hemoglobin unchanged today. Hold all anticoagulation. 3. Acute  renal failure-Some improvement today. Follow closely.  4. Acute diastolic congestive heart failure-patient is volume overloaded. Renal function improving. Follow I-O 's. May need low-dose diuretic.   Kirk Ruths  MD [facility]  09/15/2014, 11:10 AM

## 2014-09-15 NOTE — Progress Notes (Signed)
Ambulated around unit with walker and on room air. No reports of distress. Vitals stable

## 2014-09-15 NOTE — Procedures (Signed)
Extubation Procedure Note  Patient Details:   Name: [name] [name] DOB: 1929-11-19 MRN: [id number]   Airway Documentation:     Evaluation  O2 sats: stable throughout Complications: No apparent complications Patient did tolerate procedure well. Bilateral Breath Sounds: Rhonchi;Diminished Suctioning: Airway Yes  Patient extubated to 4 LNC at this time per MD order. Patient able to vocalize and clear secretions. RT will continue to monitor.  Saunders Glance 09/15/2014, 9:21 AM

## 2014-09-15 NOTE — Progress Notes (Signed)
[place] Progress Note Patient Name: [name] [name] DOB: [birth year] MRN: [id number]   Date of Service  09/15/2014  HPI/Events of Note    eICU Interventions  Changed SSi coverage to ac + hs     Intervention Category Intermediate Interventions: Hyperglycemia - evaluation and treatment  [name] S. 09/15/2014, 7:52 PM

## 2014-09-15 NOTE — Progress Notes (Signed)
225cc fentenyl wasted from 250cc fentenel bag, withnessed by [employer] RN

## 2014-09-15 NOTE — Clinical Documentation Improvement (Addendum)
  Cardiology Service  "NSTEMI' documented in current medical record.  "Demand Ischemia" also documented in current medical record.  "NSTEMI" codes to 410.71 Subendocardial Infarction, initial episode of care.  "Demand Ischemia" codes to 411.89 Other Acute and Subacute forms of Ischemic Heart Disease.  The diagnosis of NSTEMI will direct the current DRG to 280 Acute Myocardial Infarction with major comorbidity.  The diagnosis of Demand Ischemia will direct the current DRG to 291 Heart Failure with major comorbidity.  Please clarify the applicable diagnosis for this admission:   - NSTEMI   - Demand Ischemia  Thank You, Erling Conte ,RN Clinical Documentation Specialist:  [place] [employer]

## 2014-09-15 NOTE — Progress Notes (Signed)
Subjective: On vent but alert. Denies abdominal pain now. Had cramping yesterday that improved after passing a lot of gas. No BM  Objective: Vital signs in last 24 hours: Temp:  [99.6 F (37.6 C)-100.7 F (38.2 C)] 99.7 F (37.6 C) (09/28 0800) Pulse Rate:  [96-125] 107 (09/28 0800) Resp:  [15-25] 21 (09/28 0800) BP: (99-139)/(40-94) 139/50 mmHg (09/28 0800) SpO2:  [93 %-100 %] 98 % (09/28 0800) FiO2 (%):  [30 %] 30 % (09/28 0800) Weight:  [170 lb 13.7 oz (77.5 kg)] 170 lb 13.7 oz (77.5 kg) (09/28 0600)    Intake/Output from previous day: 09/27 0701 - 09/28 0700 In: 1493.9 [I.V.:743.9; Blood:620; NG/GT:30; IV Piggyback:100] Out: 935 [Urine:935] Intake/Output this shift:    General appearance: cooperative Resp: clear to auscultation bilaterally Cardio: RRR 120 GI: soft, distended but nontender  Lab Results:   Recent Labs  09/14/14 0317  09/14/14 2139 09/15/14 0629  WBC 14.9*  --   --  10.1  HGB 7.3*  < > 8.6* 8.2*  HCT 22.0*  < > 25.2* 24.5*  PLT 276  --   --  242  < > = values in this interval not displayed. BMET  Recent Labs  09/14/14 0317 09/15/14 0629  NA 141 141  K 4.3 3.9  CL 102 103  CO2 25 25  GLUCOSE 140* 118*  BUN 61* 67*  CREATININE 3.26* 2.65*  CALCIUM 7.7* 7.7*   PT/INR  Recent Labs  09/13/14 1115  LABPROT 15.0  INR 1.18   ABG  Recent Labs  09/14/14 0426 09/15/14 0327  PHART 7.408 7.406  HCO3 23.8 24.0    Studies/Results: Dg Chest Port 1 View  09/15/2014   CLINICAL DATA:  ET tube position.  EXAM: PORTABLE CHEST - 1 VIEW  COMPARISON:  09/14/2014  FINDINGS: Endotracheal tube is 3 cm above the carina. Low lung volumes with patchy bilateral airspace opacities, most pronounced in the right lower lobe, increased since prior study. Heart is borderline in size. No effusions. No acute bony abnormality.  IMPRESSION: Low lung volumes with increasing patchy bilateral airspace opacities, atelectasis versus pneumonia.   Electronically  Signed   By: Rolm Baptise M.D.   On: 09/15/2014 07:20   Dg Chest Port 1 View  09/14/2014   CLINICAL DATA:  Evaluate endotracheal tube  EXAM: PORTABLE CHEST - 1 VIEW  COMPARISON:  Portable exam 0520 hr compared to 09/13/2014  FINDINGS: Tip of endotracheal tube projects approximately 2.6 cm above carina.  Nasogastric tube extends into stomach.  RIGHT jugular central venous catheter tip projects over cavoatrial junction.  Borderline enlargement of cardiac silhouette.  Mediastinal contours and pulmonary vascularity normal.  Bibasilar atelectasis.  No definite pulmonary infiltrate, pleural effusion or pneumothorax.  No acute osseous findings.  IMPRESSION: Bibasilar atelectasis slightly greater on LEFT.   Electronically Signed   By: Lavonia Dana M.D.   On: 09/14/2014 07:38   Dg Chest Port 1 View  09/13/2014   CLINICAL DATA:  Right IJ placement .  EXAM: PORTABLE CHEST - 1 VIEW  COMPARISON:  09/13/2014.  FINDINGS: Endotracheal tube and NG tube noted in stable position. Right IJ line noted in good anatomic position. Mediastinum and hilar structures normal. Basilar atelectasis. No pleural effusion or pneumothorax. No acute bony abnormality.  IMPRESSION: 1. Endotracheal tube and NG tube in stable position. Interim placement of a right IJ line, its tip is in good anatomic position. 2. Bibasilar atelectasis and/or mild infiltrates.   Electronically Signed   By: Marcello Moores  Register   On: 09/13/2014 16:32    Anti-infectives: Anti-infectives   Start     Dose/Rate Route Frequency Ordered Stop   09/14/14 2300  piperacillin-tazobactam (ZOSYN) IVPB 2.25 g     2.25 g 100 mL/hr over 30 Minutes Intravenous Every 6 hours 09/14/14 2141        Assessment/Plan: Spontaneous anticoagulation-related intra-abdominal hemorrhage - Hb seems to be stabilizing, exam OK. Almost off pressors. No need for exploration at this point. VDRF - I D/W CCM. OK to extubate from our standpoint. I spoke at length with his wife and son  LOS: 3 days     Jayne Peckenpaugh E 09/15/2014

## 2014-09-15 NOTE — Progress Notes (Signed)
PULMONARY / CRITICAL CARE MEDICINE   Name: [name] [name] MRN: [id number] DOB: 02-19-1929    ADMISSION DATE:  09/12/2014 CONSULTATION DATE:  09/12/2014  REFERRING MD :  EDP  CHIEF COMPLAINT:  SOB  INITIAL PRESENTATION:  80 year old male presented to [facility] ED 9/25 with acute onset SOB. Profoundly hypoxic in ED with sat 50%. Was intubated in ED. CXR consistent with edema.  PCCM to admit.   STUDIES:  ECHO 9/25>>>The estimated ejection fraction was 50%. There is hypokinesis of the anteroseptal myocardium.PA peak pressure: 70 mm Hg (S).  SIGNIFICANT EVENTS: 9/25 admitted. + NSTEMI vs demand ischemia and pulmonary edema. Felt r/t volume overload from recent prep for colonoscopy.  Treated w/ ASA, beta blocker, heparin and statin.  9/26: acute hgb drop. 8.8-->6.0. CT abd positive for RPB. Heparin stopped. Got 2 units PRBC for  Hemorrhagic shock, required levophed for support. Heparin gtt stopped. Surgery following.   9/27: weaning pressors. Still on vent. Hgb drifted again from 9.1-->7.3. No abd pain. Worsening renal faiure - creat 3s  SUBJECTIVE:  Weaning, levo low  VITAL SIGNS: Temp:  [99.6 F (37.6 C)-100.7 F (38.2 C)] 99.7 F (37.6 C) (09/28 0800) Pulse Rate:  [96-125] 107 (09/28 0800) Resp:  [15-25] 21 (09/28 0800) BP: (99-139)/(40-94) 139/50 mmHg (09/28 0800) SpO2:  [93 %-100 %] 98 % (09/28 0800) FiO2 (%):  [30 %] 30 % (09/28 0800) Weight:  [77.5 kg (170 lb 13.7 oz)] 77.5 kg (170 lb 13.7 oz) (09/28 0600) HEMODYNAMICS:   VENTILATOR SETTINGS: Vent Mode:  [-] CPAP;PSV FiO2 (%):  [30 %] 30 % Set Rate:  [16 bmp-169 bmp] 16 bmp Vt Set:  [500 mL] 500 mL PEEP:  [5 cmH20] 5 cmH20 Pressure Support:  [5 cmH20] 5 cmH20 Plateau Pressure:  [11 cmH20-23 cmH20] 23 cmH20 INTAKE / OUTPUT:  Intake/Output Summary (Last 24 hours) at 09/15/14 0854 Last data filed at 09/15/14 0700  Gross per 24 hour  Intake 1448.88 ml  Output    935 ml  Net 513.88 ml    PHYSICAL EXAMINATION: General:   Elderly male on vent in NAD Neuro: rass 0, cam neg HEENT:  [place]/AT, PER Cardiovascular:  RRR, 3/6 SEM unchanged Lungs:  Coarse wnl Abdomen:  Distention less, normal appearance per wife, 2 hernias. pender to palp mild Musculoskeletal:  Edema unchanged 2 plus Skin:  Intact  LABS: PULMONARY  Recent Labs Lab 09/12/14 0529 09/12/14 0802 09/14/14 0426 09/15/14 0327  PHART 7.312* 7.348* 7.408 7.406  PCO2ART 51.7* 45.7* 38.5 39.3  PO2ART 273.0* 76.0* 91.8 101.0*  HCO3 26.1* 25.1* 23.8 24.0  TCO2 28 26 25.0 25.2  O2SAT 100.0 94.0 97.0 97.4    CBC  Recent Labs Lab 09/13/14 1115 09/14/14 0317  09/14/14 1407 09/14/14 2139 09/15/14 0629  HGB 9.1* 7.3*  < > 7.7* 8.6* 8.2*  HCT 26.8* 22.0*  < > 22.9* 25.2* 24.5*  WBC 17.7* 14.9*  --   --   --  10.1  PLT 376 276  --   --   --  242  < > = values in this interval not displayed.  COAGULATION  Recent Labs Lab 09/12/14 0445 09/13/14 1115  INR 1.11 1.18    CARDIAC    Recent Labs Lab 09/12/14 1004 09/12/14 1618 09/12/14 1915 09/13/14 1115  TROPONINI 2.16* 3.74* 3.11* 1.93*    Recent Labs Lab 09/12/14 0445  PROBNP 5726.0*     CHEMISTRY  Recent Labs Lab 09/12/14 0445 09/13/14 0230 09/14/14 0317 09/15/14 0629  NA 147  147 141 141  K 4.4 4.9 4.3 3.9  CL 106 108 102 103  CO2 24 29 25 25   GLUCOSE 156* 213* 140* 118*  BUN 32* 45* 61* 67*  CREATININE 1.30 1.83* 3.26* 2.65*  CALCIUM 8.8 8.1* 7.7* 7.7*  MG  --   --  2.2 2.4  PHOS  --   --  5.5* 4.2   Estimated Creatinine Clearance: 19.7 ml/min (by C-G formula based on Cr of 2.65).   LIVER  Recent Labs Lab 09/12/14 0445 09/13/14 1115 09/14/14 0317 09/15/14 0629  AST 40*  --  15 13  ALT 22  --  12 11  ALKPHOS 138*  --  73 70  BILITOT 1.3*  --  1.4* 3.1*  PROT 7.5  --  5.2* 5.1*  ALBUMIN 3.5  --  2.4* 2.2*  INR 1.11 1.18  --   --      INFECTIOUS  Recent Labs Lab 09/12/14 1915 09/13/14 1115 09/14/14 0317 09/14/14 1407 09/15/14 0629   LATICACIDVEN 1.1 1.3 0.7  --   --   PROCALCITON  --   --   --  6.68 4.02     ENDOCRINE CBG (last 3)   Recent Labs  09/14/14 2347 09/15/14 0427 09/15/14 0752  GLUCAP 119* 118* 125*    IMAGING x 48h Dg Chest Port 1 View  09/15/2014   CLINICAL DATA:  ET tube position.  EXAM: PORTABLE CHEST - 1 VIEW  COMPARISON:  09/14/2014  FINDINGS: Endotracheal tube is 3 cm above the carina. Low lung volumes with patchy bilateral airspace opacities, most pronounced in the right lower lobe, increased since prior study. Heart is borderline in size. No effusions. No acute bony abnormality.  IMPRESSION: Low lung volumes with increasing patchy bilateral airspace opacities, atelectasis versus pneumonia.   Electronically Signed   By: Rolm Baptise M.D.   On: 09/15/2014 07:20   Dg Chest Port 1 View  09/14/2014   CLINICAL DATA:  Evaluate endotracheal tube  EXAM: PORTABLE CHEST - 1 VIEW  COMPARISON:  Portable exam 0520 hr compared to 09/13/2014  FINDINGS: Tip of endotracheal tube projects approximately 2.6 cm above carina.  Nasogastric tube extends into stomach.  RIGHT jugular central venous catheter tip projects over cavoatrial junction.  Borderline enlargement of cardiac silhouette.  Mediastinal contours and pulmonary vascularity normal.  Bibasilar atelectasis.  No definite pulmonary infiltrate, pleural effusion or pneumothorax.  No acute osseous findings.  IMPRESSION: Bibasilar atelectasis slightly greater on LEFT.   Electronically Signed   By: Lavonia Dana M.D.   On: 09/14/2014 07:38   Dg Chest Port 1 View  09/13/2014   CLINICAL DATA:  Right IJ placement .  EXAM: PORTABLE CHEST - 1 VIEW  COMPARISON:  09/13/2014.  FINDINGS: Endotracheal tube and NG tube noted in stable position. Right IJ line noted in good anatomic position. Mediastinum and hilar structures normal. Basilar atelectasis. No pleural effusion or pneumothorax. No acute bony abnormality.  IMPRESSION: 1. Endotracheal tube and NG tube in stable position.  Interim placement of a right IJ line, its tip is in good anatomic position. 2. Bibasilar atelectasis and/or mild infiltrates.   Electronically Signed   By: Marcello Moores  Register   On: 09/13/2014 16:32       ASSESSMENT / PLAN:  PULMONARY OETT 9/25 >>> A: Acute hypoxemic/hypercarbic respiratory failure in setting of edema Pulmonary edema H/o COPD  P:   Weaning this am cpap5 ps 5, goal 30 min  Consider even to neg balance pending resolution pressors  pcxr in am  abg reviewed, keep same MV on rest  CARDIOVASCULAR CVL A:  Hypotension - initially propofol related. On 9/26 precipitated by blood loss and hypovolemic/hemorrhagic shock  Acute CHF (LVEF 55% with grade 1 DD 02/2013) NSTEMI/demand ischemia  Still pressor dependant and tachycardic. His CEs are trending down.  P:  Wean pressors for MAP >65 Levo is about to be dc Continue preadmission statin If failed weaning, add lasix  RENAL A:   AKIN stage 3 or Acute Renal Failure in setting of hypoperfusion  Edema? P:   Renal dose meds Low threshold lasix start F/u chemistry   GASTROINTESTINAL A:  Anterior abo bleed from anticoagulation  P:   SUP ppx: IV protonix No OR planned If not extubated, start TF  HEMATOLOGIC A:   Anemia Acute blood loss anemia in setting of spontaneous peritoneal hemorrhage on heparin    - got 1 unit  PRBC 09/14/14 P:  PAS Trend CBC further in am  Transfuse for hgb <7  INFECTIOUS A:   R/o PNA  P:   BCx2 9/25 >>> UC 9/25 >>> Check PCT 09/14/14>>>6.68>>>4.02 zosyn 9/27>>>  ENDOCRINE A:   No acute issues   P:   Follow glucose on chemistry  NEUROLOGIC A:   Acute encephalopathy - resolved  P:   RASS goal: 0 Fentanyl gtt off PAD protocol  Global: improved, no OR, weaning, almost off pressors, crt improved, may need lasix  I have personally obtained a history, examined the patient, evaluated laboratory and imaging results, formulated the assessment and plan and placed  orders. CRITICAL CARE: The patient is critically ill with multiple organ systems failure and requires high complexity decision making for assessment and support, frequent evaluation and titration of therapies, application of advanced monitoring technologies and extensive interpretation of multiple databases. Critical Care Time devoted to patient care services described in this note is 30 min  Lavon Paganini. Titus Mould, MD, [place] Pgr: [place] [facility]

## 2014-09-16 ENCOUNTER — Inpatient Hospital Stay (HOSPITAL_COMMUNITY): Payer: Medicare Other

## 2014-09-16 LAB — CBC WITH DIFFERENTIAL/PLATELET
BASOS PCT: 0 % (ref 0–1)
Basophils Absolute: 0 10*3/uL (ref 0.0–0.1)
Eosinophils Absolute: 0.1 10*3/uL (ref 0.0–0.7)
Eosinophils Relative: 1 % (ref 0–5)
HEMATOCRIT: 23.6 % — AB (ref 39.0–52.0)
HEMOGLOBIN: 7.7 g/dL — AB (ref 13.0–17.0)
LYMPHS ABS: 0.6 10*3/uL — AB (ref 0.7–4.0)
Lymphocytes Relative: 7 % — ABNORMAL LOW (ref 12–46)
MCH: 30.3 pg (ref 26.0–34.0)
MCHC: 32.6 g/dL (ref 30.0–36.0)
MCV: 92.9 fL (ref 78.0–100.0)
MONO ABS: 1.1 10*3/uL — AB (ref 0.1–1.0)
MONOS PCT: 12 % (ref 3–12)
NEUTROS ABS: 7 10*3/uL (ref 1.7–7.7)
Neutrophils Relative %: 80 % — ABNORMAL HIGH (ref 43–77)
Platelets: 247 10*3/uL (ref 150–400)
RBC: 2.54 MIL/uL — AB (ref 4.22–5.81)
RDW: 17.5 % — ABNORMAL HIGH (ref 11.5–15.5)
WBC: 8.7 10*3/uL (ref 4.0–10.5)

## 2014-09-16 LAB — BASIC METABOLIC PANEL
Anion gap: 11 (ref 5–15)
BUN: 66 mg/dL — AB (ref 6–23)
CHLORIDE: 105 meq/L (ref 96–112)
CO2: 27 meq/L (ref 19–32)
CREATININE: 2.38 mg/dL — AB (ref 0.50–1.35)
Calcium: 8 mg/dL — ABNORMAL LOW (ref 8.4–10.5)
GFR calc Af Amer: 27 mL/min — ABNORMAL LOW (ref >90)
GFR calc non Af Amer: 24 mL/min — ABNORMAL LOW (ref >90)
Glucose, Bld: 113 mg/dL — ABNORMAL HIGH (ref 70–99)
POTASSIUM: 3.7 meq/L (ref 3.7–5.3)
Sodium: 143 mEq/L (ref 137–147)

## 2014-09-16 LAB — GLUCOSE, CAPILLARY
GLUCOSE-CAPILLARY: 110 mg/dL — AB (ref 70–99)
GLUCOSE-CAPILLARY: 123 mg/dL — AB (ref 70–99)
GLUCOSE-CAPILLARY: 117 mg/dL — AB (ref 70–99)
Glucose-Capillary: 129 mg/dL — ABNORMAL HIGH (ref 70–99)

## 2014-09-16 LAB — PHOSPHORUS: Phosphorus: 3.5 mg/dL (ref 2.3–4.6)

## 2014-09-16 LAB — MAGNESIUM: Magnesium: 2.6 mg/dL — ABNORMAL HIGH (ref 1.5–2.5)

## 2014-09-16 LAB — PROCALCITONIN: Procalcitonin: 2.71 ng/mL

## 2014-09-16 MED ORDER — ENSURE COMPLETE PO LIQD
237.0000 mL | Freq: Two times a day (BID) | ORAL | Status: DC
  Administered 2014-09-16 – 2014-09-19 (×7): 237 mL via ORAL

## 2014-09-16 MED ORDER — METOPROLOL TARTRATE 12.5 MG HALF TABLET
12.5000 mg | ORAL_TABLET | Freq: Two times a day (BID) | ORAL | Status: DC
  Administered 2014-09-16 – 2014-09-20 (×9): 12.5 mg via ORAL
  Filled 2014-09-16 (×10): qty 1

## 2014-09-16 MED ORDER — DEXTROSE 5 % IV SOLN
1.0000 g | INTRAVENOUS | Status: DC
  Administered 2014-09-16 – 2014-09-18 (×3): 1 g via INTRAVENOUS
  Filled 2014-09-16 (×3): qty 10

## 2014-09-16 MED ORDER — FUROSEMIDE 10 MG/ML IJ SOLN
40.0000 mg | Freq: Every day | INTRAMUSCULAR | Status: DC
  Administered 2014-09-17: 40 mg via INTRAVENOUS
  Filled 2014-09-16: qty 4

## 2014-09-16 MED ORDER — FUROSEMIDE 10 MG/ML IJ SOLN
40.0000 mg | Freq: Two times a day (BID) | INTRAMUSCULAR | Status: DC
  Administered 2014-09-16: 40 mg via INTRAVENOUS
  Filled 2014-09-16 (×3): qty 4

## 2014-09-16 MED ORDER — ADULT MULTIVITAMIN W/MINERALS CH
1.0000 | ORAL_TABLET | Freq: Every day | ORAL | Status: DC
  Administered 2014-09-16 – 2014-09-20 (×5): 1 via ORAL
  Filled 2014-09-16 (×5): qty 1

## 2014-09-16 NOTE — Progress Notes (Signed)
KUB - no dilated bowel. Agree Georganna Skeans, MD, MPH, FACS Trauma: [phone] General Surgery: [phone]

## 2014-09-16 NOTE — Progress Notes (Signed)
PULMONARY / CRITICAL CARE MEDICINE   Name: [name] [name] MRN: [id number] DOB: August 27, 1929    ADMISSION DATE:  09/12/2014 CONSULTATION DATE:  09/12/2014  REFERRING MD :  EDP  CHIEF COMPLAINT:  SOB  INITIAL PRESENTATION:  80 year old male presented to [facility] ED 9/25 with acute onset SOB. Profoundly hypoxic in ED with sat 50%. Was intubated in ED. CXR consistent with edema.  PCCM to admit.   STUDIES:  ECHO 9/25>>>The estimated ejection fraction was 50%. There is hypokinesis of the anteroseptal myocardium.PA peak pressure: 70 mm Hg (S).  SIGNIFICANT EVENTS: 9/25 admitted. + NSTEMI vs demand ischemia and pulmonary edema. Felt r/t volume overload from recent prep for colonoscopy.  Treated w/ ASA, beta blocker, heparin and statin.  9/26: acute hgb drop. 8.8-->6.0. CT abd positive for RPB. Heparin stopped. Got 2 units PRBC for  Hemorrhagic shock, required levophed for support. Heparin gtt stopped. Surgery following.   9/27: weaning pressors. Still on vent. Hgb drifted again from 9.1-->7.3. No abd pain. Worsening renal faiure - creat 3s 9/28: extubated  SUBJECTIVE:  No distress on RA  VITAL SIGNS: Temp:  [97.8 F (36.6 C)-100.3 F (37.9 C)] 99.7 F (37.6 C) (09/29 0200) Pulse Rate:  [51-122] 106 (09/29 0200) Resp:  [18-28] 28 (09/29 0200) BP: (91-139)/(36-58) 91/58 mmHg (09/29 0200) SpO2:  [90 %-99 %] 92 % (09/29 0200) FiO2 (%):  [30 %] 30 % (09/28 0900) Weight:  [76.4 kg (168 lb 6.9 oz)-77.5 kg (170 lb 13.7 oz)] 76.4 kg (168 lb 6.9 oz) (09/29 0200) HEMODYNAMICS:   VENTILATOR SETTINGS: Vent Mode:  [-] CPAP;PSV FiO2 (%):  [30 %] 30 % PEEP:  [5 cmH20] 5 cmH20 Pressure Support:  [5 cmH20] 5 cmH20 INTAKE / OUTPUT:  Intake/Output Summary (Last 24 hours) at 09/16/14 0454 Last data filed at 09/16/14 0000  Gross per 24 hour  Intake 557.13 ml  Output   1200 ml  Net -642.87 ml    PHYSICAL EXAMINATION: General:  Elderly male no distress on RA Neuro: nonfocal HEENT:  [place]/AT,  PER Cardiovascular:  RRR, 3/6 SEM unchanged Lungs:  Coarse unchanged Abdomen:  Distention unchanged, no r/g, NT Musculoskeletal:  Edema unchanged 2 plus Skin:  Intact  LABS: PULMONARY  Recent Labs Lab 09/12/14 0529 09/12/14 0802 09/14/14 0426 09/15/14 0327  PHART 7.312* 7.348* 7.408 7.406  PCO2ART 51.7* 45.7* 38.5 39.3  PO2ART 273.0* 76.0* 91.8 101.0*  HCO3 26.1* 25.1* 23.8 24.0  TCO2 28 26 25.0 25.2  O2SAT 100.0 94.0 97.0 97.4    CBC  Recent Labs Lab 09/13/14 1115 09/14/14 0317  09/14/14 1407 09/14/14 2139 09/15/14 0629  HGB 9.1* 7.3*  < > 7.7* 8.6* 8.2*  HCT 26.8* 22.0*  < > 22.9* 25.2* 24.5*  WBC 17.7* 14.9*  --   --   --  10.1  PLT 376 276  --   --   --  242  < > = values in this interval not displayed.  COAGULATION  Recent Labs Lab 09/12/14 0445 09/13/14 1115  INR 1.11 1.18    CARDIAC    Recent Labs Lab 09/12/14 1004 09/12/14 1618 09/12/14 1915 09/13/14 1115  TROPONINI 2.16* 3.74* 3.11* 1.93*    Recent Labs Lab 09/12/14 0445  PROBNP 5726.0*     CHEMISTRY  Recent Labs Lab 09/12/14 0445 09/13/14 0230 09/14/14 0317 09/15/14 0629  NA 147 147 141 141  K 4.4 4.9 4.3 3.9  CL 106 108 102 103  CO2 24 29 25 25   GLUCOSE 156* 213* 140*  118*  BUN 32* 45* 61* 67*  CREATININE 1.30 1.83* 3.26* 2.65*  CALCIUM 8.8 8.1* 7.7* 7.7*  MG  --   --  2.2 2.4  PHOS  --   --  5.5* 4.2   Estimated Creatinine Clearance: 19.7 ml/min (by C-G formula based on Cr of 2.65).   LIVER  Recent Labs Lab 09/12/14 0445 09/13/14 1115 09/14/14 0317 09/15/14 0629  AST 40*  --  15 13  ALT 22  --  12 11  ALKPHOS 138*  --  73 70  BILITOT 1.3*  --  1.4* 3.1*  PROT 7.5  --  5.2* 5.1*  ALBUMIN 3.5  --  2.4* 2.2*  INR 1.11 1.18  --   --      INFECTIOUS  Recent Labs Lab 09/12/14 1915 09/13/14 1115 09/14/14 0317 09/14/14 1407 09/15/14 0629  LATICACIDVEN 1.1 1.3 0.7  --   --   PROCALCITON  --   --   --  6.68 4.02     ENDOCRINE CBG (last 3)    Recent Labs  09/15/14 1227 09/15/14 1619 09/15/14 2157  GLUCAP 119* 145* 143*    IMAGING x 48h Dg Chest Port 1 View  09/15/2014   CLINICAL DATA:  ET tube position.  EXAM: PORTABLE CHEST - 1 VIEW  COMPARISON:  09/14/2014  FINDINGS: Endotracheal tube is 3 cm above the carina. Low lung volumes with patchy bilateral airspace opacities, most pronounced in the right lower lobe, increased since prior study. Heart is borderline in size. No effusions. No acute bony abnormality.  IMPRESSION: Low lung volumes with increasing patchy bilateral airspace opacities, atelectasis versus pneumonia.   Electronically Signed   By: Rolm Baptise M.D.   On: 09/15/2014 07:20   Dg Chest Port 1 View  09/14/2014   CLINICAL DATA:  Evaluate endotracheal tube  EXAM: PORTABLE CHEST - 1 VIEW  COMPARISON:  Portable exam 0520 hr compared to 09/13/2014  FINDINGS: Tip of endotracheal tube projects approximately 2.6 cm above carina.  Nasogastric tube extends into stomach.  RIGHT jugular central venous catheter tip projects over cavoatrial junction.  Borderline enlargement of cardiac silhouette.  Mediastinal contours and pulmonary vascularity normal.  Bibasilar atelectasis.  No definite pulmonary infiltrate, pleural effusion or pneumothorax.  No acute osseous findings.  IMPRESSION: Bibasilar atelectasis slightly greater on LEFT.   Electronically Signed   By: Lavonia Dana M.D.   On: 09/14/2014 07:38    ASSESSMENT / PLAN:  PULMONARY OETT 9/25 >>>9/28 A: Acute hypoxemic/hypercarbic respiratory failure in setting of edema Pulmonary edema H/o COPD  P:   Remains on RA Can consider low dose lasix IS  CARDIOVASCULAR CVL A:  Hypotension - initially propofol related. On 9/26 precipitated by blood loss and hypovolemic/hemorrhagic shock  Acute CHF (LVEF 55% with grade 1 DD 02/2013) NSTEMI/demand ischemia  Still pressor dependant and tachycardic. His CEs are trending down.  P:  Lasix Tele to remain Cardiology  following  RENAL A:   AKIN stage 3 or Acute Renal Failure in setting of hypoperfusion  Edema P:   Lasix, chem awaited kvo F/u chemistry   GASTROINTESTINAL A:  Anterior abo bleed from anticoagulation  P:   SUP ppx: IV protonix, dc as NOT home med Carb mod  HEMATOLOGIC A:   Anemia Acute blood loss anemia in setting of spontaneous peritoneal hemorrhage on heparin    - got 1 unit  PRBC 09/14/14 P:  PAS Cbc in am , awaited this am now Transfuse for hgb <7  INFECTIOUS  A:   R/o PNA  P:   BCx2 9/25 >>> UC 9/25 >>> Check PCT 09/14/14>>>6.68>>>4.02 zosyn 9/27>>>9/29 Narrow off as remains culture neg Consider 5 days abx Ceftriaxone 9/29>>>  ENDOCRINE A:   No acute issues   P:   Follow glucose on chemistry  NEUROLOGIC A:   Acute encephalopathy - resolved  P:   RASS goal: 0 PT ambulation   Global: progressing well, to tele, will sign out to triad, lasix  I have personally obtained a history, examined the patient, evaluated laboratory and imaging results, formulated the assessment and plan and placed orders.  Lavon Paganini. Titus Mould, MD, [place] Pgr: [place] [facility]

## 2014-09-16 NOTE — Evaluation (Signed)
Physical Therapy Evaluation Patient Details Name: [name] [name] MRN: [id number] DOB: Oct 20, 1929 Today's Date: 09/16/2014   History of Present Illness    80 year old male presented to [facility] ED 9/25 with acute onset SOB. Profoundly hypoxic in ED with sat 50%. Was intubated in ED. CXR consistent with edema. PCCM to admit. NSTEMI and COPD as well.      Clinical Impression  Pt admitted with above. Pt currently with functional limitations due to the deficits listed below (see PT Problem List). Pt should progress well and go home with HHPT f/u.  May need a RW for home use.  Pt will benefit from skilled PT to increase their independence and safety with mobility to allow discharge to the venue listed below.    Follow Up Recommendations Home health PT;Supervision - Intermittent    Equipment Recommendations  Rolling walker with 5" wheels    Recommendations for Other Services       Precautions / Restrictions Precautions Precautions: Fall Restrictions Weight Bearing Restrictions: No      Mobility  Bed Mobility Overal bed mobility: Needs Assistance Bed Mobility: Supine to Sit     Supine to sit: Supervision        Transfers Overall transfer level: Needs assistance Equipment used: None Transfers: Sit to/from Stand Sit to Stand: Min guard         General transfer comment: Pt needed cues for hand placement and initial steadying assist at EOB.    Ambulation/Gait Ambulation/Gait assistance: Min guard;Min assist Ambulation Distance (Feet): 150 Feet Assistive device: None Gait Pattern/deviations: Staggering right   Gait velocity interpretation: Below normal speed for age/gender General Gait Details: Pt had one LOB to right needing min assist to recover.  Needed steadying assist at a few other times.  Pt and wife instructed that pt might need RW for safety with ambulation.   Stairs            Wheelchair Mobility    Modified Rankin (Stroke Patients Only)       Balance  Overall balance assessment: Needs assistance;History of Falls Sitting-balance support: No upper extremity supported;Feet supported Sitting balance-Leahy Scale: Good     Standing balance support: No upper extremity supported;During functional activity Standing balance-Leahy Scale: Poor Standing balance comment: Requires handheld assist of 1 for support at times.               High level balance activites: Direction changes;Turns;Sudden stops High Level Balance Comments: Needed min assist with the above activities.               Pertinent Vitals/Pain Pain Assessment: No/denies pain VSS.  Sats >95% on RA.      Home Living Family/patient expects to be discharged to:: Private residence Living Arrangements: Spouse/significant other Available Help at Discharge: Family;Available 24 hours/day Type of Home: House Home Access: Stairs to enter Entrance Stairs-Rails: None Entrance Stairs-Number of Steps: 1 Home Layout: One level Home Equipment: None      Prior Function Level of Independence: Independent               Hand Dominance   Dominant Hand: Right    Extremity/Trunk Assessment   Upper Extremity Assessment: Defer to OT evaluation           Lower Extremity Assessment: Generalized weakness      Cervical / Trunk Assessment: Normal  Communication   Communication: No difficulties  Cognition Arousal/Alertness: Awake/alert Behavior During Therapy: WFL for tasks assessed/performed Overall Cognitive Status: Within Functional Limits for  tasks assessed                      General Comments      Exercises General Exercises - Lower Extremity Ankle Circles/Pumps: AROM;5 reps;Both;Seated Long Arc Quad: AROM;Both;10 reps;Seated      Assessment/Plan    PT Assessment Patient needs continued PT services  PT Diagnosis Generalized weakness   PT Problem List Decreased activity tolerance;Decreased balance;Decreased mobility;Decreased knowledge of use  of DME;Decreased safety awareness;Decreased knowledge of precautions  PT Treatment Interventions DME instruction;Gait training;Functional mobility training;Therapeutic activities;Therapeutic exercise;Patient/family education;Balance training   PT Goals (Current goals can be found in the Care Plan section) Acute Rehab PT Goals Patient Stated Goal: to go home PT Goal Formulation: With patient Time For Goal Achievement: 09/23/14 Potential to Achieve Goals: Good    Frequency Min 3X/week   Barriers to discharge        Co-evaluation               End of Session Equipment Utilized During Treatment: Gait belt Activity Tolerance: Patient limited by fatigue Patient left: in chair;with call bell/phone within reach;with family/visitor present Nurse Communication: Mobility status         Time: 0904-0920 PT Time Calculation (min): 16 min   Charges:   PT Evaluation $Initial PT Evaluation Tier I: 1 Procedure PT Treatments $Gait Training: 8-22 mins   PT G Codes:          INGOLD,Haely Leyland 09-25-2014, 11:16 AM [facility] [phone] (pager)

## 2014-09-16 NOTE — Progress Notes (Signed)
Patient ambulated with standby assistance to the nurses station and back to his room without difficulty. He did have some unsteadiness at first, but was able to walk after getting his balance.

## 2014-09-16 NOTE — Progress Notes (Addendum)
NUTRITION FOLLOW UP  Intervention:   Provide Ensure Complete BID after breakfast and dinner, each supplement provides 350 kcal and 13 grams of protein Provide snack once daily Provide Multivitamin with minerals daily Encourage PO intake  Nutrition Dx:   Inadequate oral intake related to inability to eat as evidenced by NPO status; discontinued  New Nutrition Dx: Inadequate oral intake related to poor appetite and recent intubation as evidenced by < 50% meal completion and 2% weight loss in less than one week.  Goal:   Intake to meet >90% of estimated nutrition needs; unmet  Monitor:   TF tolerance/adequacy, weight trend, labs, vent status.  Assessment:   80 year old male presented to [facility] ED 9/25 with acute onset SOB. Profoundly hypoxic in ED with sat 50%. Was intubated in ED. CXR consistent with edema.   Pt was extubated yesterday morning; diet advanced to clear liquids for lunch and Heart healthy diet around dinner. It appears pt did not receive any tube feedings while on the vent due to medical issues. Pt's weight has dropped 4 lbs since admission- 2% weight loss in less than one week.  Pt having difficulty talking at time of visit; his son assisted in providing history. Pt reports poor appetite. Per son he ate a small amount for breakfast and a sweet potato and some vanilla ice cream for lunch. Pt also states he was eating poorly PTA.  Pt agreeable to receiving snacks and trying Ensure.   Per nursing notes pt consumed 50% of his breakfast this morning.  Labs: elevated BUN and creatinine, low calcium, decreased GFR, elevated magnesium, low hemoglobin  Height: Ht Readings from Last 1 Encounters:  09/16/14 5\' 6"  (1.676 m)    Weight Status:   Wt Readings from Last 1 Encounters:  09/16/14 166 lb 7.2 oz (75.5 kg)  09/12/14 170 lb 13.7 oz (77.5 kg)  12/30/13 175 lb 8 oz (79.606 kg)   Re-estimated needs:  Kcal: 1800-2000 Protein: 90-100 grams Fluid: 1.8 L/day  Skin: intact; +1  RLE and LLE edema  Diet Order: Cardiac   Intake/Output Summary (Last 24 hours) at 09/16/14 1608 Last data filed at 09/16/14 1500  Gross per 24 hour  Intake    590 ml  Output   2925 ml  Net  -2335 ml    Last BM: 9/28  Labs:   Recent Labs Lab 09/14/14 0317 09/15/14 0629 09/16/14 0500  NA 141 141 143  K 4.3 3.9 3.7  CL 102 103 105  CO2 25 25 27   BUN 61* 67* 66*  CREATININE 3.26* 2.65* 2.38*  CALCIUM 7.7* 7.7* 8.0*  MG 2.2 2.4 2.6*  PHOS 5.5* 4.2 3.5  GLUCOSE 140* 118* 113*    CBG (last 3)   Recent Labs  09/15/14 2157 09/16/14 0812 09/16/14 1117  GLUCAP 143* 110* 123*    Scheduled Meds: . antiseptic oral rinse  7 mL Mouth Rinse BID  . cefTRIAXone (ROCEPHIN)  IV  1 g Intravenous Q24H  . [START ON 09/17/2014] furosemide  40 mg Intravenous Daily  . insulin aspart  0-15 Units Subcutaneous TID WC  . insulin aspart  0-5 Units Subcutaneous QHS  . metoprolol tartrate  12.5 mg Oral BID  . simvastatin  20 mg Per Tube QPM    Continuous Infusions:   Pryor Ochoa RD, LDN Inpatient Clinical Dietitian Pager: [phone] After Hours Pager: [phone]

## 2014-09-16 NOTE — Progress Notes (Signed)
Subjective:  Denies dyspnea or chest pain  Objective:  Vital Signs in the last 24 hours: BP 108/44  Pulse 99  Temp(Src) 99.5 F (37.5 C) (Core (Comment))  Resp 25  Ht 5\' 6"  (1.676 m)  Wt 168 lb 14 oz (76.6 kg)  BMI 27.27 kg/m2  SpO2 92%  Physical Exam: Elderly WM in NAD Neck: supple Lungs:  CTA anteriorly Cardiac:  RRR Abdomen:  Distended, not tender, hypoactive BS Extremities:  trace edema present  Intake/Output from previous day: 09/28 0701 - 09/29 0700 In: 612.1 [P.O.:120; I.V.:182.1; IV Piggyback:250] Out: 1962 [Urine:1550]  Weight Filed Weights   09/15/14 0600 09/16/14 0200 09/16/14 0700  Weight: 170 lb 13.7 oz (77.5 kg) 168 lb 6.9 oz (76.4 kg) 168 lb 14 oz (76.6 kg)    Lab Results: Basic Metabolic Panel:  Recent Labs  09/15/14 0629 09/16/14 0500  NA 141 143  K 3.9 3.7  CL 103 105  CO2 25 27  GLUCOSE 118* 113*  BUN 67* 66*  CREATININE 2.65* 2.38*   CBC:  Recent Labs  09/15/14 0629 09/16/14 0500  WBC 10.1 8.7  NEUTROABS  --  7.0  HGB 8.2* 7.7*  HCT 24.5* 23.6*  MCV 91.1 92.9  PLT 242 247   Cardiac Enzymes:  Recent Labs  09/13/14 1115  TROPONINI 1.93*    Telemetry: Sinus   Assessment/Plan:  1. Non-ST elevation myocardial infarction-felt to be related to demand ischemia in the setting of respiratory failure/volume excess. Continue statin. Add low-dose metoprolol. No aspirin given recent bleed. FU Dr [place] following DC; medical therapy probably best option; cannot consider cath or PCI at present given intraperitoneal bleed and inability to use anticoagulation. 2. Acute blood loss anemia/recent bleed- felt secondary to heparin and spontaneous peritoneal hemorrhage. Hemoglobin trending down; follow. Hold all anticoagulation. 3. Acute renal failure-Continued improvement today. Follow closely.  4. Acute diastolic congestive heart failure-patient's volume status improving. Renal function improving. Change lasix to 40 mg daily; will most  likely not need long term.   Kirk Ruths  MD [facility] Cardiology  09/16/2014, 11:06 AM

## 2014-09-16 NOTE — Progress Notes (Signed)
Report called to receiving unit (3E); patient ambulated to wheelchair with standby assist, transported to new unit without incidence. Patient weighed on new unit prior to being assisted back into bed.

## 2014-09-16 NOTE — Progress Notes (Signed)
[facility] Progress Note     Subjective: Pt doing okay.  No pain or N/V.  Passing some flatus, says he's had BM's but he doesn't know if there was blood in it.    Objective: Vital signs in last 24 hours: Temp:  [97.8 F (36.6 C)-100.3 F (37.9 C)] 99.5 F (37.5 C) (09/29 0700) Pulse Rate:  [51-113] 99 (09/29 0700) Resp:  [22-28] 25 (09/29 0700) BP: (91-136)/(36-58) 108/44 mmHg (09/29 0700) SpO2:  [90 %-99 %] 92 % (09/29 0700) Weight:  [168 lb 6.9 oz (76.4 kg)-168 lb 14 oz (76.6 kg)] 168 lb 14 oz (76.6 kg) (09/29 0700)    Intake/Output from previous day: 09/28 0701 - 09/29 0700 In: 612.1 [P.O.:120; I.V.:182.1; IV Piggyback:250] Out: 7510 [Urine:1550] Intake/Output this shift:    PE: Gen:  Alert, NAD, pleasant Abd: Soft, distended, tympanic, NT, +BS, some high pitched, no HSM, umbilical hernia palpated   Lab Results:   Recent Labs  09/15/14 0629 09/16/14 0500  WBC 10.1 8.7  HGB 8.2* 7.7*  HCT 24.5* 23.6*  PLT 242 247   BMET  Recent Labs  09/15/14 0629 09/16/14 0500  NA 141 143  K 3.9 3.7  CL 103 105  CO2 25 27  GLUCOSE 118* 113*  BUN 67* 66*  CREATININE 2.65* 2.38*  CALCIUM 7.7* 8.0*   PT/INR  Recent Labs  09/13/14 1115  LABPROT 15.0  INR 1.18   CMP     Component Value Date/Time   NA 143 09/16/2014 0500   K 3.7 09/16/2014 0500   CL 105 09/16/2014 0500   CO2 27 09/16/2014 0500   GLUCOSE 113* 09/16/2014 0500   BUN 66* 09/16/2014 0500   CREATININE 2.38* 09/16/2014 0500   CREATININE 1.18 04/25/2013 1041   CALCIUM 8.0* 09/16/2014 0500   PROT 5.1* 09/15/2014 0629   ALBUMIN 2.2* 09/15/2014 0629   AST 13 09/15/2014 0629   ALT 11 09/15/2014 0629   ALKPHOS 70 09/15/2014 0629   BILITOT 3.1* 09/15/2014 0629   GFRNONAA 24* 09/16/2014 0500   GFRAA 27* 09/16/2014 0500   Lipase     Component Value Date/Time   LIPASE 26 02/27/2013 2022       Studies/Results: Dg Chest Port 1 View  09/16/2014   CLINICAL DATA:  80 year old male with respiratory  failure, hemorrhagic shock, non ST elevation myocardial infarction. Initial encounter.  EXAM: PORTABLE CHEST - 1 VIEW  COMPARISON:  09/15/2014 and earlier.  FINDINGS: Portable AP semi upright view at 0457 hrs. Extubated. Enteric tube removed. Stable right IJ central line. Stable lung volumes. Mild bibasilar patchy opacity has improved since 09/14/2014. No areas of worsening ventilation. No pneumothorax, pulmonary edema or definite effusion. Normal cardiac size and mediastinal contours.  IMPRESSION: 1. Extubated and enteric tube removed. 2. Mildly improved ventilation with stable lung volumes and mild bibasilar opacity favored to be atelectasis.   Electronically Signed   By: Lars Pinks M.D.   On: 09/16/2014 07:24   Dg Chest Port 1 View  09/15/2014   CLINICAL DATA:  ET tube position.  EXAM: PORTABLE CHEST - 1 VIEW  COMPARISON:  09/14/2014  FINDINGS: Endotracheal tube is 3 cm above the carina. Low lung volumes with patchy bilateral airspace opacities, most pronounced in the right lower lobe, increased since prior study. Heart is borderline in size. No effusions. No acute bony abnormality.  IMPRESSION: Low lung volumes with increasing patchy bilateral airspace opacities, atelectasis versus pneumonia.   Electronically Signed   By: Rolm Baptise M.D.  On: 09/15/2014 07:20    Anti-infectives: Anti-infectives   Start     Dose/Rate Route Frequency Ordered Stop   09/16/14 0600  cefTRIAXone (ROCEPHIN) 1 g in dextrose 5 % 50 mL IVPB     1 g 100 mL/hr over 30 Minutes Intravenous Every 24 hours 09/16/14 0500     09/14/14 2300  piperacillin-tazobactam (ZOSYN) IVPB 2.25 g  Status:  Discontinued     2.25 g 100 mL/hr over 30 Minutes Intravenous Every 6 hours 09/14/14 2141 09/16/14 0512       Assessment/Plan Anticoagulation related spontaneous intra-abdominal hemorrhage ABL Anemia - Hgb 7.7 Abdominal distension NSTEMI/demand ischemia AKI - Cr. Improving 2.38  Plan: 1.  Hgb stabilizing, extubated, weaning off  pressors 2.  Cardiology following, transferring from CCM to triad hospitalist service 3.  Advanced to heart healthy diet last night 4.  Ambulate and IS 5.  SCD's and hold dvt proph for now given bleeding risk 6.  Check KUB given distension     LOS: 4 days    DORT, Jinny Blossom 09/16/2014, 9:56 AM Pager: [phone]

## 2014-09-17 ENCOUNTER — Encounter (HOSPITAL_COMMUNITY): Payer: Self-pay | Admitting: General Practice

## 2014-09-17 DIAGNOSIS — I5033 Acute on chronic diastolic (congestive) heart failure: Secondary | ICD-10-CM | POA: Diagnosis present

## 2014-09-17 DIAGNOSIS — K661 Hemoperitoneum: Secondary | ICD-10-CM

## 2014-09-17 DIAGNOSIS — I248 Other forms of acute ischemic heart disease: Secondary | ICD-10-CM

## 2014-09-17 DIAGNOSIS — D62 Acute posthemorrhagic anemia: Secondary | ICD-10-CM

## 2014-09-17 DIAGNOSIS — I509 Heart failure, unspecified: Secondary | ICD-10-CM

## 2014-09-17 DIAGNOSIS — I2510 Atherosclerotic heart disease of native coronary artery without angina pectoris: Secondary | ICD-10-CM

## 2014-09-17 HISTORY — DX: Hemoperitoneum: K66.1

## 2014-09-17 LAB — CBC WITH DIFFERENTIAL/PLATELET
BASOS ABS: 0 10*3/uL (ref 0.0–0.1)
Basophils Relative: 0 % (ref 0–1)
Eosinophils Absolute: 0.2 10*3/uL (ref 0.0–0.7)
Eosinophils Relative: 3 % (ref 0–5)
HCT: 26.8 % — ABNORMAL LOW (ref 39.0–52.0)
Hemoglobin: 8.7 g/dL — ABNORMAL LOW (ref 13.0–17.0)
LYMPHS PCT: 10 % — AB (ref 12–46)
Lymphs Abs: 0.8 10*3/uL (ref 0.7–4.0)
MCH: 31.4 pg (ref 26.0–34.0)
MCHC: 32.5 g/dL (ref 30.0–36.0)
MCV: 96.8 fL (ref 78.0–100.0)
Monocytes Absolute: 1 10*3/uL (ref 0.1–1.0)
Monocytes Relative: 13 % — ABNORMAL HIGH (ref 3–12)
NEUTROS ABS: 5.6 10*3/uL (ref 1.7–7.7)
Neutrophils Relative %: 74 % (ref 43–77)
Platelets: 293 10*3/uL (ref 150–400)
RBC: 2.77 MIL/uL — AB (ref 4.22–5.81)
RDW: 17.5 % — AB (ref 11.5–15.5)
WBC: 7.6 10*3/uL (ref 4.0–10.5)

## 2014-09-17 LAB — TYPE AND SCREEN
ABO/RH(D): O NEG
Antibody Screen: NEGATIVE
UNIT DIVISION: 0
UNIT DIVISION: 0
UNIT DIVISION: 0
Unit division: 0
Unit division: 0
Unit division: 0

## 2014-09-17 LAB — GLUCOSE, CAPILLARY
Glucose-Capillary: 91 mg/dL (ref 70–99)
Glucose-Capillary: 118 mg/dL — ABNORMAL HIGH (ref 70–99)
Glucose-Capillary: 119 mg/dL — ABNORMAL HIGH (ref 70–99)
Glucose-Capillary: 153 mg/dL — ABNORMAL HIGH (ref 70–99)

## 2014-09-17 LAB — MAGNESIUM: Magnesium: 2.6 mg/dL — ABNORMAL HIGH (ref 1.5–2.5)

## 2014-09-17 LAB — PHOSPHORUS: PHOSPHORUS: 2.7 mg/dL (ref 2.3–4.6)

## 2014-09-17 LAB — BASIC METABOLIC PANEL
ANION GAP: 12 (ref 5–15)
BUN: 60 mg/dL — ABNORMAL HIGH (ref 6–23)
CALCIUM: 8.1 mg/dL — AB (ref 8.4–10.5)
CO2: 28 meq/L (ref 19–32)
Chloride: 105 mEq/L (ref 96–112)
Creatinine, Ser: 2.09 mg/dL — ABNORMAL HIGH (ref 0.50–1.35)
GFR calc Af Amer: 32 mL/min — ABNORMAL LOW (ref >90)
GFR, EST NON AFRICAN AMERICAN: 28 mL/min — AB (ref >90)
Glucose, Bld: 84 mg/dL (ref 70–99)
Potassium: 4 mEq/L (ref 3.7–5.3)
SODIUM: 145 meq/L (ref 137–147)

## 2014-09-17 NOTE — Progress Notes (Signed)
Foley catheter removed. Patient tolerated well.

## 2014-09-17 NOTE — Progress Notes (Signed)
Physical Therapy Treatment Patient Details Name: [name] [name] MRN: [id number] DOB: 07-Mar-1929 Today's Date: 09/17/2014    History of Present Illness 80 year old male presented to [facility] ED 9/25 with acute onset SOB. Profoundly hypoxic in ED with sat 50%. Was intubated in ED. CXR consistent with edema. PCCM to admit. NSTEMI and COPD as well.      PT Comments    Pt much improved balance and stability during session.  Anticipate pt able to safely d/c home without RW.  Follow Up Recommendations  Home health PT;Supervision - Intermittent     Equipment Recommendations  None recommended by PT    Recommendations for Other Services       Precautions / Restrictions Precautions Precautions: Fall Restrictions Weight Bearing Restrictions: No    Mobility  Bed Mobility Overal bed mobility: Needs Assistance Bed Mobility: Supine to Sit;Sit to Supine     Supine to sit: Supervision;HOB elevated Sit to supine: Supervision;HOB elevated   General bed mobility comments: able to perform without rail  Transfers Overall transfer level: Needs assistance Equipment used: None Transfers: Sit to/from Stand Sit to Stand: Supervision            Ambulation/Gait Ambulation/Gait assistance: Supervision Ambulation Distance (Feet): 250 Feet Assistive device: None Gait Pattern/deviations: WFL(Within Functional Limits);Wide base of support Gait velocity: decreased Gait velocity interpretation: Below normal speed for age/gender General Gait Details: no LOB with ambulation today   Stairs            Wheelchair Mobility    Modified Rankin (Stroke Patients Only)       Balance Overall balance assessment: Needs assistance Sitting-balance support: No upper extremity supported;Feet supported Sitting balance-Leahy Scale: Good     Standing balance support: No upper extremity supported;During functional activity Standing balance-Leahy Scale: Good               High level balance  activites: Direction changes;Turns;Head turns High Level Balance Comments: supervision during session    Cognition Arousal/Alertness: Awake/alert Behavior During Therapy: WFL for tasks assessed/performed Overall Cognitive Status: Within Functional Limits for tasks assessed                      Exercises      General Comments        Pertinent Vitals/Pain Pain Assessment: No/denies pain    Home Living                      Prior Function            PT Goals (current goals can now be found in the care plan section) Acute Rehab PT Goals Patient Stated Goal: to go home PT Goal Formulation: With patient Time For Goal Achievement: 09/23/14 Potential to Achieve Goals: Good Progress towards PT goals: Progressing toward goals    Frequency  Min 3X/week    PT Plan Current plan remains appropriate;Equipment recommendations need to be updated    Co-evaluation             End of Session Equipment Utilized During Treatment: Gait belt Activity Tolerance: Patient tolerated treatment well Patient left: in chair;with call bell/phone within reach;with family/visitor present     Time: 3810-1751 PT Time Calculation (min): 23 min  Charges:  $Gait Training: 23-37 mins                    G Codes:      Faustino Congress K 09/17/2014, 3:08 PM  Augusto Garbe  Zigmund Daniel, PT, DPT [phone]

## 2014-09-17 NOTE — Progress Notes (Addendum)
       Patient Name: [name] [name] Date of Encounter: 09/17/2014    SUBJECTIVE: He denies cardiac complaints. He has no appetite. Abdomen still feels somewhat tight.  TELEMETRY:  No significant arrhythmias, sinus rhythm Filed Vitals:   09/16/14 2317 09/17/14 0531 09/17/14 0955 09/17/14 1404  BP: 131/92 119/46 126/42 127/54  Pulse: 98 96 91 91  Temp:  97.7 F (36.5 C)  98.8 F (37.1 C)  TempSrc:  Oral  Oral  Resp:  18  18  Height:      Weight:  161 lb 13.1 oz (73.4 kg)    SpO2:  97%  98%    Intake/Output Summary (Last 24 hours) at 09/17/14 1430 Last data filed at 09/17/14 1400  Gross per 24 hour  Intake   1200 ml  Output   3600 ml  Net  -2400 ml   LABS: Basic Metabolic Panel:  Recent Labs  09/16/14 0500 09/17/14 0435  NA 143 145  K 3.7 4.0  CL 105 105  CO2 27 28  GLUCOSE 113* 84  BUN 66* 60*  CREATININE 2.38* 2.09*  CALCIUM 8.0* 8.1*  MG 2.6* 2.6*  PHOS 3.5 2.7   CBC:  Recent Labs  09/16/14 0500 09/17/14 0435  WBC 8.7 7.6  NEUTROABS 7.0 5.6  HGB 7.7* 8.7*  HCT 23.6* 26.8*  MCV 92.9 96.8  PLT 247 293    Fasting Lipid Panel:  Recent Labs  09/15/14 0629  TRIG 123    Radiology/Studies:  No new data  Physical Exam: Blood pressure 127/54, pulse 91, temperature 98.8 F (37.1 C), temperature source Oral, resp. rate 18, height 5\' 6"  (1.676 m), weight 161 lb 13.1 oz (73.4 kg), SpO2 98.00%. Weight change: -6 lb 9.8 oz (-3 kg)  Wt Readings from Last 3 Encounters:  09/17/14 161 lb 13.1 oz (73.4 kg)  07/22/14 170 lb (77.111 kg)  05/02/14 173 lb (78.472 kg)    Abdomen is mildly distended but nontender Chest is clear posteriorly Cardiac exam reveals no gallop or murmur  ASSESSMENT:  1. Elevated troponin to to demand ischemia in the setting of hypotension and severe acute blood loss anemia. 2. Coronary artery disease with known chronic total occlusion of the LAD 3. Spontaneous intraperitoneal bleed after being started on IV heparin related to  coronary disease. Acute blood loss Anemia required transfusion. 4. Chronic diastolic heart failure, stable 5. Acute kidney injury, slowly improving  Plan:  1. Kidney function continues to improve. With increasing azotemia, I will hold diuretic therapy and recheck her renal function in the a.m. after one IV dose of furosemide given earlier today. 2. Otherwise maintain current management strategy.  Demetrios Isaacs 09/17/2014, 2:30 PM

## 2014-09-17 NOTE — Progress Notes (Signed)
Agree  [name] Skeans, MD, MPH, FACS Trauma: [phone] General Surgery: [phone]  09/17/2014 1:33 PM

## 2014-09-17 NOTE — Progress Notes (Signed)
Subjective: He says his abdomen feels OK, I think it's distended some, but he isn't appreciating any difference.  He complains his throat is sore form the ET tube.  No BM for at least 2 days, he says he isn't eating much, but says he feels he's improving.  Objective: Vital signs in last 24 hours: Temp:  [97.7 F (36.5 C)-99.1 F (37.3 C)] 97.7 F (36.5 C) (09/30 0531) Pulse Rate:  [89-98] 96 (09/30 0531) Resp:  [18-22] 18 (09/30 0531) BP: (108-131)/(46-92) 119/46 mmHg (09/30 0531) SpO2:  [93 %-97 %] 97 % (09/30 0531) Weight:  [73.4 kg (161 lb 13.1 oz)] 73.4 kg (161 lb 13.1 oz) (09/30 0531) Last BM Date: 09/14/14 720 Po/No BM recorded Afebrile, BP up once otherwise stable, he remains a bit tachycardic. Diet: Cardiac Creatinine is improving H/H is stable Intake/Output from previous day: 09/29 0701 - 09/30 0700 In: 720 [P.O.:720] Out: 3350 [Urine:3350] Intake/Output this shift:    General appearance: alert, cooperative, no distress and appears tired. Resp: clear to auscultation bilaterally and anterior exam Cardio: regular rate, he's a bit tachycardis GI: distended, no pain or tenderness, + BS, + flatus  Lab Results:   Recent Labs  09/16/14 0500 09/17/14 0435  WBC 8.7 7.6  HGB 7.7* 8.7*  HCT 23.6* 26.8*  PLT 247 293    BMET  Recent Labs  09/16/14 0500 09/17/14 0435  NA 143 145  K 3.7 4.0  CL 105 105  CO2 27 28  GLUCOSE 113* 84  BUN 66* 60*  CREATININE 2.38* 2.09*  CALCIUM 8.0* 8.1*   PT/INR No results found for this basename: LABPROT, INR,  in the last 72 hours   Recent Labs Lab 09/12/14 0445 09/14/14 0317 09/15/14 0629  AST 40* 15 13  ALT 22 12 11   ALKPHOS 138* 73 70  BILITOT 1.3* 1.4* 3.1*  PROT 7.5 5.2* 5.1*  ALBUMIN 3.5 2.4* 2.2*     Lipase     Component Value Date/Time   LIPASE 26 02/27/2013 2022     Studies/Results: Dg Chest Port 1 View  09/16/2014   CLINICAL DATA:  80 year old male with respiratory failure, hemorrhagic shock,  non ST elevation myocardial infarction. Initial encounter.  EXAM: PORTABLE CHEST - 1 VIEW  COMPARISON:  09/15/2014 and earlier.  FINDINGS: Portable AP semi upright view at 0457 hrs. Extubated. Enteric tube removed. Stable right IJ central line. Stable lung volumes. Mild bibasilar patchy opacity has improved since 09/14/2014. No areas of worsening ventilation. No pneumothorax, pulmonary edema or definite effusion. Normal cardiac size and mediastinal contours.  IMPRESSION: 1. Extubated and enteric tube removed. 2. Mildly improved ventilation with stable lung volumes and mild bibasilar opacity favored to be atelectasis.   Electronically Signed   By: Lars Pinks M.D.   On: 09/16/2014 07:24   Dg Abd 2 Views  09/16/2014   CLINICAL DATA:  Abdominal pain and distention.  EXAM: ABDOMEN - 2 VIEW  COMPARISON:  09/13/2014 CT.  FINDINGS: The bowel gas pattern is unremarkable.  No dilated bowel loops are present.  There is no evidence of pneumoperitoneum or suspicious calcifications.  IMPRESSION: No acute abnormalities.   Electronically Signed   By: Hassan Rowan M.D.   On: 09/16/2014 13:13    Medications: . antiseptic oral rinse  7 mL Mouth Rinse BID  . cefTRIAXone (ROCEPHIN)  IV  1 g Intravenous Q24H  . feeding supplement (ENSURE COMPLETE)  237 mL Oral BID PC  . furosemide  40 mg Intravenous Daily  .  insulin aspart  0-15 Units Subcutaneous TID WC  . insulin aspart  0-5 Units Subcutaneous QHS  . metoprolol tartrate  12.5 mg Oral BID  . multivitamin with minerals  1 tablet Oral Daily  . 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      LOS: 5 days     [name] [name] 09/17/2014

## 2014-09-17 NOTE — Progress Notes (Signed)
[facility] PROGRESS NOTE  [name] [name] ZES:[id number] DOB: 1929/06/01 DOA: 09/12/2014 PCP: Abigail Miyamoto, MD  Assessment/Plan: #1 acute respiratory failure Secondary to acute on chronic diastolic CHF exacerbation, plus or minus pneumonia with a history of COPD. Patient with clinical improvement. Patient has been diuresed and currently on oral Lasix which have been held. Cardiac enzymes were elevated and was felt this was secondary to demand ischemia. Continue Lopressor and Zocor. Cardiology is following.  #2 acute on chronic diastolic heart failure Likely secondary to demand ischemia. Troponins were elevated. 2-D echo with EF of 50% with hypokinesis of anteroseptal myocardial. Continue beta blocker. Patient with good diuresis. Oral Lasix have been held by cardiology. Secondary to patient's intraperitoneal hemorrhage patient cannot be on anticoagulants and will likely need to be treated medically at this time the cardiology following.  #3 NSTEMI/demand ischemia Due to patient's intraperitoneal hemorrhage unable to be placed on heparin and unable to get a cardiac catheterization. 2-D echo with EF of 50% with hypokinesis of the anteroseptal myocardium. Medical management for now. Continue beta blocker, statin. Patient received a dose of Lasix today and has been held. Per cardiology.  #4 acute renal failure Likely secondary to acute on chronic diastolic heart failure with hypotension. Renal function improving with diuresis. Follow.  #5 intraperitoneal hemorrhage Patient is being followed by Gen. surgery. Abdomen is soft. Follow for now.  #6 acute blood loss anemia Secondary to intraperitoneal hemorrhage. Patient is status post 1 unit packed red blood cells 09/14/2014. Hemoglobin stable. Follow. Transfusion threshold hemoglobin less than 7.  #7 ?? Pneumonia Blood cultures with no growth to date. Urine cultures negative. Pro calcitonin was elevated but trended down. Patient received Zosyn and  currently on IV Rocephin. WIll possibly T4 total of 5 days. Follow.  #8 prophylaxis SCDs for DVT prophylaxis.   Code Status: Full Family Communication: Updated patient and wife at bedside. Disposition Plan: Home when medically stable.   Consultants:  Critical care medicine Dr. Curt Jews 09/12/2014  Cardiology: Dr Haroldine Laws 09/12/2014 General surgery: Dr. Georganna Skeans 09/13/2014    Procedures:  2-D echo 09/12/2014  CT abdomen and pelvis 09/13/2014  Abdominal x-ray 09/16/2014  Chest x-ray 09/13/2014, 09/14/2014, 09/15/2014, 09/16/2014  1 unit packed red blood cells 09/14/2014.  Antibiotics:  Zosyn 9/27>>>>> 90/29  Rocephin 9/29>>> consider 5 days on antibiotics  HPI/Subjective: Patient denies any chest pain. No shortness of breath. No abdominal pain. No complaints.  Objective: Filed Vitals:   09/17/14 1404  BP: 127/54  Pulse: 91  Temp: 98.8 F (37.1 C)  Resp: 18    Intake/Output Summary (Last 24 hours) at 09/17/14 1811 Last data filed at 09/17/14 1716  Gross per 24 hour  Intake    960 ml  Output   3175 ml  Net  -2215 ml   Filed Weights   09/16/14 0200 09/16/14 0700 09/17/14 0531  Weight: 76.4 kg (168 lb 6.9 oz) 75.5 kg (166 lb 7.2 oz) 73.4 kg (161 lb 13.1 oz)    Exam:   General:  NAD  Cardiovascular: RRR with 3/6 SEM  Respiratory: Clear to auscultation bilaterally.  Abdomen: Soft, distended, positive bowel sounds, nontender.  Musculoskeletal: No clubbing cyanosis or edema. And  Data Reviewed: Basic Metabolic Panel:  Recent Labs Lab 09/13/14 0230 09/14/14 0317 09/15/14 0629 09/16/14 0500 09/17/14 0435  NA 147 141 141 143 145  K 4.9 4.3 3.9 3.7 4.0  CL 108 102 103 105 105  CO2 29 25 25 27 28   GLUCOSE 213* 140* 118* 113*  84  BUN 45* 61* 67* 66* 60*  CREATININE 1.83* 3.26* 2.65* 2.38* 2.09*  CALCIUM 8.1* 7.7* 7.7* 8.0* 8.1*  MG  --  2.2 2.4 2.6* 2.6*  PHOS  --  5.5* 4.2 3.5 2.7   Liver Function Tests:  Recent Labs Lab  09/12/14 0445 09/14/14 0317 09/15/14 0629  AST 40* 15 13  ALT 22 12 11   ALKPHOS 138* 73 70  BILITOT 1.3* 1.4* 3.1*  PROT 7.5 5.2* 5.1*  ALBUMIN 3.5 2.4* 2.2*   No results found for this basename: LIPASE, AMYLASE,  in the last 168 hours No results found for this basename: AMMONIA,  in the last 168 hours CBC:  Recent Labs Lab 09/12/14 0445 09/12/14 1340  09/13/14 1115 09/14/14 0317  09/14/14 1407 09/14/14 2139 09/15/14 0629 09/16/14 0500 09/17/14 0435  WBC 13.0* 7.6  < > 17.7* 14.9*  --   --   --  10.1 8.7 7.6  NEUTROABS 8.2* 7.3  --   --   --   --   --   --   --  7.0 5.6  HGB 10.1* 8.8*  < > 9.1* 7.3*  < > 7.7* 8.6* 8.2* 7.7* 8.7*  HCT 31.9* 27.2*  < > 26.8* 22.0*  < > 22.9* 25.2* 24.5* 23.6* 26.8*  MCV 104.9* 100.0  < > 93.1 93.2  --   --   --  91.1 92.9 96.8  PLT 406* 278  < > 376 276  --   --   --  242 247 293  < > = values in this interval not displayed. Cardiac Enzymes:  Recent Labs Lab 09/12/14 1004 09/12/14 1618 09/12/14 1915 09/13/14 1115  TROPONINI 2.16* 3.74* 3.11* 1.93*   BNP (last 3 results)  Recent Labs  09/12/14 0445  PROBNP 5726.0*   CBG:  Recent Labs Lab 09/16/14 1546 09/16/14 2028 09/17/14 0552 09/17/14 1051 09/17/14 1738  GLUCAP 117* 129* 91 118* 119*    Recent Results (from the past 240 hour(s))  CULTURE, BLOOD (ROUTINE X 2)     Status: None   Collection Time    09/12/14  4:50 AM      Result Value Ref Range Status   Specimen Description BLOOD WRIST RIGHT   Final   Special Requests BOTTLES DRAWN AEROBIC ONLY 5ML   Final   Culture  Setup Time     Final   Value: 09/12/2014 09:08     Performed at [facility]   Culture     Final   Value:        BLOOD CULTURE RECEIVED NO GROWTH TO DATE CULTURE WILL BE HELD FOR 5 DAYS BEFORE ISSUING A FINAL NEGATIVE REPORT     Performed at [facility]   Report Status PENDING   Incomplete  URINE CULTURE     Status: None   Collection Time    09/12/14  5:16 AM      Result Value  Ref Range Status   Specimen Description URINE, CATHETERIZED   Final   Special Requests NONE   Final   Culture  Setup Time     Final   Value: 09/12/2014 09:28     Performed at [place]     Final   Value: NO GROWTH     Performed at [facility]   Culture     Final   Value: NO GROWTH     Performed at [facility]   Report Status  09/13/2014 FINAL   Final  CULTURE, BLOOD (ROUTINE X 2)     Status: None   Collection Time    09/12/14  6:20 AM      Result Value Ref Range Status   Specimen Description BLOOD ARM RIGHT   Final   Special Requests BOTTLES DRAWN AEROBIC AND ANAEROBIC 4ML   Final   Culture  Setup Time     Final   Value: 09/12/2014 12:30     Performed at [facility]   Culture     Final   Value:        BLOOD CULTURE RECEIVED NO GROWTH TO DATE CULTURE WILL BE HELD FOR 5 DAYS BEFORE ISSUING A FINAL NEGATIVE REPORT     Performed at [facility]   Report Status PENDING   Incomplete  MRSA PCR SCREENING     Status: None   Collection Time    09/12/14  9:02 AM      Result Value Ref Range Status   MRSA by PCR NEGATIVE  NEGATIVE Final   Comment:            The GeneXpert MRSA Assay (FDA     approved for NASAL specimens     only), is one component of a     comprehensive MRSA colonization     surveillance program. It is not     intended to diagnose MRSA     infection nor to guide or     monitor treatment for     MRSA infections.     Studies: Dg Chest Port 1 View  09/16/2014   CLINICAL DATA:  80 year old male with respiratory failure, hemorrhagic shock, non ST elevation myocardial infarction. Initial encounter.  EXAM: PORTABLE CHEST - 1 VIEW  COMPARISON:  09/15/2014 and earlier.  FINDINGS: Portable AP semi upright view at 0457 hrs. Extubated. Enteric tube removed. Stable right IJ central line. Stable lung volumes. Mild bibasilar patchy opacity has improved since 09/14/2014. No areas of worsening ventilation. No pneumothorax,  pulmonary edema or definite effusion. Normal cardiac size and mediastinal contours.  IMPRESSION: 1. Extubated and enteric tube removed. 2. Mildly improved ventilation with stable lung volumes and mild bibasilar opacity favored to be atelectasis.   Electronically Signed   By: Lars Pinks M.D.   On: 09/16/2014 07:24   Dg Abd 2 Views  09/16/2014   CLINICAL DATA:  Abdominal pain and distention.  EXAM: ABDOMEN - 2 VIEW  COMPARISON:  09/13/2014 CT.  FINDINGS: The bowel gas pattern is unremarkable.  No dilated bowel loops are present.  There is no evidence of pneumoperitoneum or suspicious calcifications.  IMPRESSION: No acute abnormalities.   Electronically Signed   By: Hassan Rowan M.D.   On: 09/16/2014 13:13    Scheduled Meds: . antiseptic oral rinse  7 mL Mouth Rinse BID  . cefTRIAXone (ROCEPHIN)  IV  1 g Intravenous Q24H  . feeding supplement (ENSURE COMPLETE)  237 mL Oral BID PC  . insulin aspart  0-15 Units Subcutaneous TID WC  . insulin aspart  0-5 Units Subcutaneous QHS  . metoprolol tartrate  12.5 mg Oral BID  . multivitamin with minerals  1 tablet Oral Daily  . simvastatin  20 mg Per Tube QPM   Continuous Infusions:   Principal Problem:   Acute respiratory failure Active Problems:   NSTEMI (non-ST elevated myocardial infarction)   Acute kidney failure   Hyperlipidemia   CAD (coronary artery disease)   Hemorrhagic shock   Diastolic CHF,  acute on chronic   Intraperitoneal hemorrhage   Acute blood loss anemia    Time spent: 49 minutes    THOMPSON,DANIEL M.D. [facility] Pager [phone]. If 7PM-7AM, please contact night-coverage at [email or web address], password [facility] 09/17/2014, 6:11 PM  LOS: 5 days

## 2014-09-18 DIAGNOSIS — I248 Other forms of acute ischemic heart disease: Secondary | ICD-10-CM

## 2014-09-18 DIAGNOSIS — K661 Hemoperitoneum: Secondary | ICD-10-CM

## 2014-09-18 DIAGNOSIS — R778 Other specified abnormalities of plasma proteins: Secondary | ICD-10-CM | POA: Diagnosis present

## 2014-09-18 DIAGNOSIS — J9601 Acute respiratory failure with hypoxia: Secondary | ICD-10-CM

## 2014-09-18 DIAGNOSIS — D62 Acute posthemorrhagic anemia: Secondary | ICD-10-CM

## 2014-09-18 DIAGNOSIS — R7989 Other specified abnormal findings of blood chemistry: Secondary | ICD-10-CM | POA: Diagnosis present

## 2014-09-18 LAB — CULTURE, BLOOD (ROUTINE X 2)
Culture: NO GROWTH
Culture: NO GROWTH

## 2014-09-18 LAB — GLUCOSE, CAPILLARY
GLUCOSE-CAPILLARY: 104 mg/dL — AB (ref 70–99)
GLUCOSE-CAPILLARY: 119 mg/dL — AB (ref 70–99)
Glucose-Capillary: 97 mg/dL (ref 70–99)
Glucose-Capillary: 86 mg/dL (ref 70–99)

## 2014-09-18 LAB — BASIC METABOLIC PANEL
Anion gap: 12 (ref 5–15)
BUN: 48 mg/dL — ABNORMAL HIGH (ref 6–23)
CALCIUM: 8.3 mg/dL — AB (ref 8.4–10.5)
CO2: 30 meq/L (ref 19–32)
CREATININE: 1.53 mg/dL — AB (ref 0.50–1.35)
Chloride: 103 mEq/L (ref 96–112)
GFR calc Af Amer: 47 mL/min — ABNORMAL LOW (ref >90)
GFR calc non Af Amer: 40 mL/min — ABNORMAL LOW (ref >90)
Glucose, Bld: 96 mg/dL (ref 70–99)
Potassium: 3.5 mEq/L — ABNORMAL LOW (ref 3.7–5.3)
SODIUM: 145 meq/L (ref 137–147)

## 2014-09-18 LAB — CBC
HEMATOCRIT: 30.8 % — AB (ref 39.0–52.0)
HEMOGLOBIN: 9.7 g/dL — AB (ref 13.0–17.0)
MCH: 30.9 pg (ref 26.0–34.0)
MCHC: 31.5 g/dL (ref 30.0–36.0)
MCV: 98.1 fL (ref 78.0–100.0)
Platelets: 322 10*3/uL (ref 150–400)
RBC: 3.14 MIL/uL — ABNORMAL LOW (ref 4.22–5.81)
RDW: 17.3 % — ABNORMAL HIGH (ref 11.5–15.5)
WBC: 6.8 10*3/uL (ref 4.0–10.5)

## 2014-09-18 LAB — TRIGLYCERIDES: TRIGLYCERIDES: 250 mg/dL — AB (ref <150)

## 2014-09-18 MED ORDER — LEVOFLOXACIN 750 MG PO TABS
750.0000 mg | ORAL_TABLET | ORAL | Status: DC
  Administered 2014-09-19: 750 mg via ORAL
  Filled 2014-09-18 (×2): qty 1

## 2014-09-18 NOTE — Progress Notes (Signed)
[facility] PROGRESS NOTE  Assessment/Plan: Acute respiratory failure  - Secondary to acute on chronic diastolic CHF exacerbation, plus or minus pneumonia. - Patient with clinical improvement.  - Cardiac enzymes were elevated and was felt this was secondary to demand ischemia. Continue Lopressor and Zocor. - noe off O2. - home in 24 hrs.  Acute on chronic diastolic heart failure  - Troponins were elevated. 2-D echo with EF of 50% with hypokinesis of anteroseptal myocardial.  - Continue beta blocker. - Dc'd lasix, if diuretics needed home on HCTZ. - Secondary to patient's intraperitoneal hemorrhage patient cannot be on anticoagulants. - d/c telemetry.  Elevated Troponin's in the setting of Demand ischemia: - . 2-D echo with EF of 50% with hypokinesis of the anteroseptal myocardium. - cardiology consulted rec medical management for now.  - Continue beta blocker, statin.  Acute renal failure  - Likely secondary to acute on chronic diastolic heart failure with hypotension.  - cont to improve with out diuretics. - b-met in am, home in 24hrs  Spontaneous Intraperitoneal hemorrhage  - Patient is being followed by Gen. surgery. Abdomen is soft  Acute blood loss anemia  Secondary to Spontaneous intraperitoneal hemorrhage.  Patient is status post 1 unit packed red blood cells 09/14/2014.  Hemoglobin stable. Follow. Transfusion threshold hemoglobin less than 7.  Code Status: Full  Family Communication: Updated patient and wife at bedside.  Disposition Plan: Home when medically stable.    Consultants: Critical care medicine Dr. Curt Jews 09/12/2014  Cardiology: Dr Haroldine Laws 09/12/2014 General surgery: Dr. Georganna Skeans 09/13/2014    Procedures: 2-D echo 09/12/2014  CT abdomen and pelvis 09/13/2014  Abdominal x-ray 09/16/2014  Chest x-ray 09/13/2014, 09/14/2014, 09/15/2014, 09/16/2014  1 unit packed red blood cells 09/14/2014.    Antibiotics: Zosyn 9/27>>>>> 90/29  Rocephin 9/29>>> consider 5 days on antibiotics   HPI/Subjective: No ocmpalins  Objective: Filed Vitals:   09/17/14 1404 09/17/14 2107 09/18/14 0554 09/18/14 1121  BP: 127/54 142/42 110/73 122/46  Pulse: 91 92 84 88  Temp: 98.8 F (37.1 C) 99.1 F (37.3 C) 98.2 F (36.8 C) 98.1 F (36.7 C)  TempSrc: Oral Oral Oral Oral  Resp: _0 Height:      Weight:   71.9 kg (158 lb 8.2 oz)   SpO2: 98% 97% 97%     Intake/Output Summary (Last 24 hours) at 09/18/14 1126 Last data filed at 09/18/14 0834  Gross per 24 hour  Intake    600 ml  Output   2630 ml  Net  -2030 ml   Filed Weights   09/16/14 0700 09/17/14 0531 09/18/14 0554  Weight: 75.5 kg (166 lb 7.2 oz) 73.4 kg (161 lb 13.1 oz) 71.9 kg (158 lb 8.2 oz)    Exam:  General: Alert, awake, oriented x3, in no acute distress.  HEENT: No bruits, no goiter.  Heart: Regular rate and rhythm. Lungs: Good air movement, clear Abdomen: Soft, nontender, nondistended, positive bowel sounds.     Data Reviewed: Basic Metabolic Panel:  Recent Labs Lab 09/13/14 0230 09/14/14 0317 09/15/14 0629 09/16/14 0500 09/17/14 0435 09/18/14 0610  NA 147 141 141 143 145 145  K 4.9 4.3 3.9 3.7 4.0 3.5*  CL 108 102 103 105 105 103  CO2 _1 GLUCOSE 213* 140* 118* 113* 84 96  BUN 45* 61* 67* 66* 60* 48*  CREATININE 1.83* 3.26* 2.65* 2.38* 2.09* 1.53*  CALCIUM 8.1* 7.7* 7.7* 8.0* 8.1* 8.3*  MG  --  2.2 2.4 2.6* 2.6*  --   PHOS  --  5.5* 4.2 3.5 2.7  --    Liver Function Tests:  Recent Labs Lab 09/12/14 0445 09/14/14 0317 09/15/14 0629  AST 40* 15 13  ALT _0 ALKPHOS 138* 73 70  BILITOT 1.3* 1.4* 3.1*  PROT 7.5 5.2* 5.1*  ALBUMIN 3.5 2.4* 2.2*   No results found for this basename: LIPASE, AMYLASE,  in the last 168 hours No results found for this basename: AMMONIA,  in the last 168 hours CBC:  Recent Labs Lab 09/12/14 0445 09/12/14 1340  09/14/14 0317  09/14/14 2139 09/15/14 0629 09/16/14 0500  09/17/14 0435 09/18/14 0610  WBC 13.0* 7.6  < > 14.9*  --   --  10.1 8.7 7.6 6.8  NEUTROABS 8.2* 7.3  --   --   --   --   --  7.0 5.6  --   HGB 10.1* 8.8*  < > 7.3*  < > 8.6* 8.2* 7.7* 8.7* 9.7*  HCT 31.9* 27.2*  < > 22.0*  < > 25.2* 24.5* 23.6* 26.8* 30.8*  MCV 104.9* 100.0  < > 93.2  --   --  91.1 92.9 96.8 98.1  PLT 406* 278  < > 276  --   --  242 247 293 322  < > = values in this interval not displayed. Cardiac Enzymes:  Recent Labs Lab 09/12/14 1004 09/12/14 1618 09/12/14 1915 09/13/14 1115  TROPONINI 2.16* 3.74* 3.11* 1.93*   BNP (last 3 results)  Recent Labs  09/12/14 0445  PROBNP 5726.0*   CBG:  Recent Labs Lab 09/17/14 0552 09/17/14 1051 09/17/14 1738 09/17/14 2053 09/18/14 0649  GLUCAP 91 118* 119* 153* 104*    Recent Results (from the past 240 hour(s))  CULTURE, BLOOD (ROUTINE X 2)     Status: None   Collection Time    09/12/14  4:50 AM      Result Value Ref Range Status   Specimen Description BLOOD WRIST RIGHT   Final   Special Requests BOTTLES DRAWN AEROBIC ONLY 5ML   Final   Culture  Setup Time     Final   Value: 09/12/2014 09:08     Performed at [facility]   Culture     Final   Value: NO GROWTH 5 DAYS     Performed at [facility]   Report Status 09/18/2014 FINAL   Final  URINE CULTURE     Status: None   Collection Time    09/12/14  5:16 AM      Result Value Ref Range Status   Specimen Description URINE, CATHETERIZED   Final   Special Requests NONE   Final   Culture  Setup Time     Final   Value: 09/12/2014 09:28     Performed at [place]     Final   Value: NO GROWTH     Performed at [facility]   Culture     Final   Value: NO GROWTH     Performed at [facility]   Report Status 09/13/2014 FINAL   Final  CULTURE, BLOOD (ROUTINE X 2)     Status: None   Collection Time    09/12/14  6:20 AM      Result Value Ref Range Status   Specimen Description BLOOD ARM RIGHT   Final     Special Requests BOTTLES DRAWN AEROBIC AND ANAEROBIC 4ML  Final   Culture  Setup Time     Final   Value: 09/12/2014 12:30     Performed at [facility]   Culture     Final   Value: NO GROWTH 5 DAYS     Performed at [facility]   Report Status 09/18/2014 FINAL   Final  MRSA PCR SCREENING     Status: None   Collection Time    09/12/14  9:02 AM      Result Value Ref Range Status   MRSA by PCR NEGATIVE  NEGATIVE Final   Comment:            The GeneXpert MRSA Assay (FDA     approved for NASAL specimens     only), is one component of a     comprehensive MRSA colonization     surveillance program. It is not     intended to diagnose MRSA     infection nor to guide or     monitor treatment for     MRSA infections.     Studies: Dg Abd 2 Views  09/16/2014   CLINICAL DATA:  Abdominal pain and distention.  EXAM: ABDOMEN - 2 VIEW  COMPARISON:  09/13/2014 CT.  FINDINGS: The bowel gas pattern is unremarkable.  No dilated bowel loops are present.  There is no evidence of pneumoperitoneum or suspicious calcifications.  IMPRESSION: No acute abnormalities.   Electronically Signed   By: Hassan Rowan M.D.   On: 09/16/2014 13:13    Scheduled Meds: . antiseptic oral rinse  7 mL Mouth Rinse BID  . feeding supplement (ENSURE COMPLETE)  237 mL Oral BID PC  . insulin aspart  0-15 Units Subcutaneous TID WC  . insulin aspart  0-5 Units Subcutaneous QHS  . [START ON 09/19/2014] levofloxacin  750 mg Oral Q48H  . metoprolol tartrate  12.5 mg Oral BID  . multivitamin with minerals  1 tablet Oral Daily  . simvastatin  20 mg Per Tube QPM   Continuous Infusions:    Charlynne Cousins  [facility] Pager [phone]. If 8PM-8AM, please contact night-coverage at [email or web address], password [facility] 09/18/2014, 11:26 AM  LOS: 6 days

## 2014-09-18 NOTE — Care Management Note (Addendum)
    Page 1 of 2   09/20/2014     3:40:58 PM CARE MANAGEMENT NOTE 09/20/2014  Patient:  [name],[name]   Account Number:  [id number]  Date Initiated:  09/15/2014  Documentation initiated by:  [facility]  Subjective/Objective Assessment:   NSTEMI     Action/Plan:   CM to follow for disposition needs   Anticipated DC Date:  09/19/2014   Anticipated DC Plan:  [place]  CM consult      [facility] Choice  HOME HEALTH   Choice offered to / List presented to:  C-1 Patient        [place] arranged  HH-1 RN  [place]      [place].   Status of service:  Completed, signed off Medicare Important Message given?  YES (If response is "NO", the following Medicare IM given date fields will be blank) Date Medicare IM given:  09/15/2014 Medicare IM given by:  [facility] Date Additional Medicare IM given:  09/18/2014 Additional Medicare IM given by:  CRYSTAL HUTCHINSON  Discharge Disposition:  [place]  Per UR Regulation:  Reviewed for med. necessity/level of care/duration of stay  If discussed at [place] of Stay Meetings, dates discussed:   09/18/2014    Comments:  09/20/14 10:40 CM notified [facility] rep, Lelan Pons pt will be discharged today.  Pt will receive HHRn/PT services from [facility].  No other CM needs were communicated.  Mariane Masters, BSN, CM [phone].  Crystal Hutchinson RN, BSN, MSHL, CCM  Nurse - Case Manager,  ([phone]  09/18/2014 Social:  From home with wife PT RECS:  HH PT DME RECS:  none Dispo Plan:  HHS:  RN, PT  (CHF MGMT) ([facility] / Butch Penny notified)

## 2014-09-18 NOTE — Progress Notes (Addendum)
       Patient Name: [name] [name] Date of Encounter: 09/18/2014    SUBJECTIVE: The patient feels better. He has some abdominal discomfort. Breathing is adequate.  TELEMETRY:  Normal sinus rhythm Filed Vitals:   09/17/14 0955 09/17/14 1404 09/17/14 2107 09/18/14 0554  BP: 126/42 127/54 142/42 110/73  Pulse: 91 91 92 84  Temp:  98.8 F (37.1 C) 99.1 F (37.3 C) 98.2 F (36.8 C)  TempSrc:  Oral Oral Oral  Resp:  18 20 20   Height:      Weight:    158 lb 8.2 oz (71.9 kg)  SpO2:  98% 97% 97%    Intake/Output Summary (Last 24 hours) at 09/18/14 0852 Last data filed at 09/18/14 0834  Gross per 24 hour  Intake    600 ml  Output   2630 ml  Net  -2030 ml   LABS: Basic Metabolic Panel:  Recent Labs  09/16/14 0500 09/17/14 0435 09/18/14 0610  NA 143 145 145  K 3.7 4.0 3.5*  CL 105 105 103  CO2 27 28 30   GLUCOSE 113* 84 96  BUN 66* 60* 48*  CREATININE 2.38* 2.09* 1.53*  CALCIUM 8.0* 8.1* 8.3*  MG 2.6* 2.6*  --   PHOS 3.5 2.7  --    CBC:  Recent Labs  09/16/14 0500 09/17/14 0435 09/18/14 0610  WBC 8.7 7.6 6.8  NEUTROABS 7.0 5.6  --   HGB 7.7* 8.7* 9.7*  HCT 23.6* 26.8* 30.8*  MCV 92.9 96.8 98.1  PLT 247 293 322     Radiology/Studies:  No new  Physical Exam: Blood pressure 110/73, pulse 84, temperature 98.2 F (36.8 C), temperature source Oral, resp. rate 20, height 5\' 6"  (1.676 m), weight 158 lb 8.2 oz (71.9 kg), SpO2 97.00%. Weight change: -3 lb 4.9 oz (-1.5 kg)  Wt Readings from Last 3 Encounters:  09/18/14 158 lb 8.2 oz (71.9 kg)  07/22/14 170 lb (77.111 kg)  05/02/14 173 lb (78.472 kg)    Mildly distended abdomen with mild tenderness Decreased basilar breath sounds S3 gallop is heard. No edema.  ASSESSMENT:  1. Acute kidney injury resolving, and potentially in post acute kidney injury diuretic phase. Creatinine has significantly improved. 2. Improving diastolic heart failure 3. Demand ischemia as outlined previously  Plan:  1. I don't  believe he needs diuretic therapy today. His creatinine should continue to decline.  We should resume his HCTZ at discharge. 2. No further cardiac evaluation at this time.  Demetrios Isaacs 09/18/2014, 8:52 AM

## 2014-09-18 NOTE — Progress Notes (Signed)
Patient has been sitting up in chair at bedside most of the day.  He ambulated in the hallway x 189ft with supervision. Denies pain and discomfort. No insulin given this shift.

## 2014-09-19 DIAGNOSIS — N179 Acute kidney failure, unspecified: Secondary | ICD-10-CM

## 2014-09-19 LAB — BASIC METABOLIC PANEL
Anion gap: 11 (ref 5–15)
BUN: 39 mg/dL — AB (ref 6–23)
CHLORIDE: 106 meq/L (ref 96–112)
CO2: 30 mEq/L (ref 19–32)
CREATININE: 1.27 mg/dL (ref 0.50–1.35)
Calcium: 8 mg/dL — ABNORMAL LOW (ref 8.4–10.5)
GFR calc Af Amer: 59 mL/min — ABNORMAL LOW (ref >90)
GFR calc non Af Amer: 50 mL/min — ABNORMAL LOW (ref >90)
GLUCOSE: 91 mg/dL (ref 70–99)
Potassium: 3.8 mEq/L (ref 3.7–5.3)
Sodium: 147 mEq/L (ref 137–147)

## 2014-09-19 LAB — CBC
HEMATOCRIT: 29 % — AB (ref 39.0–52.0)
HEMOGLOBIN: 9.1 g/dL — AB (ref 13.0–17.0)
MCH: 30.4 pg (ref 26.0–34.0)
MCHC: 31.4 g/dL (ref 30.0–36.0)
MCV: 97 fL (ref 78.0–100.0)
Platelets: 331 10*3/uL (ref 150–400)
RBC: 2.99 MIL/uL — ABNORMAL LOW (ref 4.22–5.81)
RDW: 17 % — AB (ref 11.5–15.5)
WBC: 6.3 10*3/uL (ref 4.0–10.5)

## 2014-09-19 LAB — GLUCOSE, CAPILLARY
GLUCOSE-CAPILLARY: 122 mg/dL — AB (ref 70–99)
Glucose-Capillary: 91 mg/dL (ref 70–99)
Glucose-Capillary: 134 mg/dL — ABNORMAL HIGH (ref 70–99)
Glucose-Capillary: 77 mg/dL (ref 70–99)

## 2014-09-19 MED ORDER — MENTHOL 3 MG MT LOZG
1.0000 | LOZENGE | OROMUCOSAL | Status: DC | PRN
  Filled 2014-09-19: qty 9

## 2014-09-19 MED ORDER — FUROSEMIDE 10 MG/ML IJ SOLN
40.0000 mg | Freq: Two times a day (BID) | INTRAMUSCULAR | Status: DC
  Administered 2014-09-19 – 2014-09-20 (×3): 40 mg via INTRAVENOUS
  Filled 2014-09-19 (×3): qty 4

## 2014-09-19 NOTE — Progress Notes (Signed)
       Patient Name: [name] [name] Date of Encounter: 09/19/2014    SUBJECTIVE:Feels better but notes edema in lower extremity. No dyspnea.  TELEMETRY:  NSR  Filed Vitals:   09/18/14 1413 09/18/14 2100 09/19/14 0614 09/19/14 1345  BP: 114/50 120/44 130/66 125/46  Pulse: 74 79 93 90  Temp: 98.2 F (36.8 C) 98.4 F (36.9 C) 98 F (36.7 C) 98.7 F (37.1 C)  TempSrc: Oral Oral Oral Oral  Resp: 28 18 16 18   Height:      Weight:   158 lb 8 oz (71.895 kg)   SpO2: 97% 96% 100% 95%    Intake/Output Summary (Last 24 hours) at 09/19/14 1602 Last data filed at 09/19/14 4944  Gross per 24 hour  Intake    840 ml  Output    700 ml  Net    140 ml   LABS: Basic Metabolic Panel:  Recent Labs  09/17/14 0435 09/18/14 0610 09/19/14 0315  NA 145 145 147  K 4.0 3.5* 3.8  CL 105 103 106  CO2 28 30 30   GLUCOSE 84 96 91  BUN 60* 48* 39*  CREATININE 2.09* 1.53* 1.27  CALCIUM 8.1* 8.3* 8.0*  MG 2.6*  --   --   PHOS 2.7  --   --    CBC:  Recent Labs  09/17/14 0435 09/18/14 0610 09/19/14 0315  WBC 7.6 6.8 6.3  NEUTROABS 5.6  --   --   HGB 8.7* 9.7* 9.1*  HCT 26.8* 30.8* 29.0*  MCV 96.8 98.1 97.0  PLT 293 322 331   Cardiac Enzymes: No results found for this basename: CKTOTAL, CKMB, CKMBINDEX, TROPONINI,  in the last 72 hours BNP: No components found with this basename: POCBNP,  Hemoglobin A1C: No results found for this basename: HGBA1C,  in the last 72 hours Fasting Lipid Panel:  Recent Labs  09/18/14 0610  TRIG 250*    Radiology/Studies:  No new data  Physical Exam: Blood pressure 125/46, pulse 90, temperature 98.7 F (37.1 C), temperature source Oral, resp. rate 18, height 5\' 6"  (1.676 m), weight 158 lb 8 oz (71.895 kg), SpO2 95.00%. Weight change: -0.2 oz (-0.005 kg)  Wt Readings from Last 3 Encounters:  09/19/14 158 lb 8 oz (71.895 kg)  07/22/14 170 lb (77.111 kg)  05/02/14 173 lb (78.472 kg)    Decreased breath sounds at the bases. Cardiac reveals soft  systolic murmur. Legs: 2 bilateral lower extremity edema.  ASSESSMENT:  1. Acute kidney injury has resolved. 2. Diastolic heart failure is not symptomatic, but with LE edema, agree with diuretic. Not sure that it needs to be IV. 3. NSTEMI, asymptomatic  Plan:  Stable . Please call us back if needed.  Demetrios Isaacs 09/19/2014, 4:02 PM

## 2014-09-19 NOTE — Progress Notes (Signed)
PT Cancellation Note  Patient Details Name: Carmin Dibartolo MRN: [id number] DOB: 1929-05-26   Cancelled Treatment:    Reason Eval/Treat Not Completed: Patient declined, no reason specified.  Pt indicates ambulated with Nsg staff already this am and declining mobility at this time.  Will f/u another time.     [place], Thornton Papas 09/19/2014, 11:01 AM

## 2014-09-19 NOTE — Progress Notes (Signed)
[facility] PROGRESS NOTE  Assessment/Plan: Acute respiratory failure  - Secondary to acute on chronic diastolic CHF exacerbation, plus or minus pneumonia. - Patient with clinical improvement. Still with + HJ reflex - Cardiac enzymes were elevated and was felt this was secondary to demand ischemia. Continue Lopressor and Zocor. - noe off O2.  Acute on chronic diastolic heart failure  - Troponins were elevated. 2-D echo with EF of 50% with hypokinesis of anteroseptal myocardial.  - Continue beta blocker. Place ted hose start lasix IV lower ext edema with JVD. - d/c telemetry. Unknown estimated dry weight. - Keep legs elevated above heart level.  Elevated Troponin's in the setting of Demand ischemia: - 2-D echo with EF of 50% with hypokinesis of the anteroseptal myocardium. - Cardiology consulted rec medical management for now.  - Continue beta blocker, statin.  Acute renal failure  - Likely secondary to acute on chronic diastolic heart failure with hypotension.  - cont to improve with out diuretics. - b-met in am, home in 24hrs  Spontaneous Intraperitoneal hemorrhage  - Patient is being followed by Gen. surgery. Abdomen is soft  Acute blood loss anemia  Secondary to Spontaneous intraperitoneal hemorrhage.  Patient is status post 1 unit packed red blood cells 09/14/2014.  Hemoglobin stable. Follow. Transfusion threshold hemoglobin less than 7.  Code Status: Full  Family Communication: Updated patient and wife at bedside.  Disposition Plan: Home when medically stable.    Consultants: Critical care medicine Dr. Curt Jews 09/12/2014  Cardiology: Dr Haroldine Laws 09/12/2014 General surgery: Dr. Georganna Skeans 09/13/2014    Procedures: 2-D echo 09/12/2014  CT abdomen and pelvis 09/13/2014  Abdominal x-ray 09/16/2014  Chest x-ray 09/13/2014, 09/14/2014, 09/15/2014, 09/16/2014  1 unit packed red blood cells 09/14/2014.    Antibiotics: Zosyn 9/27>>>>> 90/29 Rocephin  9/29>>> consider 5 days on antibiotics   HPI/Subjective: No ocmpalins  Objective: Filed Vitals:   09/18/14 1121 09/18/14 1413 09/18/14 2100 09/19/14 0614  BP: 122/46 114/50 120/44 130/66  Pulse: 88 74 79 93  Temp: 98.1 F (36.7 C) 98.2 F (36.8 C) 98.4 F (36.9 C) 98 F (36.7 C)  TempSrc: Oral Oral Oral Oral  Resp: 28 28 18 16   Height:      Weight:    71.895 kg (158 lb 8 oz)  SpO2:  97% 96% 100%    Intake/Output Summary (Last 24 hours) at 09/19/14 1010 Last data filed at 09/19/14 0823  Gross per 24 hour  Intake    840 ml  Output    700 ml  Net    140 ml   Filed Weights   09/17/14 0531 09/18/14 0554 09/19/14 0614  Weight: 73.4 kg (161 lb 13.1 oz) 71.9 kg (158 lb 8.2 oz) 71.895 kg (158 lb 8 oz)    Exam:  General: Alert, awake, oriented x3, in no acute distress.  HEENT: No bruits, no goiter. +HJ reflex. Heart: Regular rate and rhythm. 2+ lower ext. Edema. Lungs: Good air movement, clear Abdomen: Soft, nontender, nondistended, positive bowel sounds.     Data Reviewed: Basic Metabolic Panel:  Recent Labs Lab 09/13/14 0230 09/14/14 0317 09/15/14 0629 09/16/14 0500 09/17/14 0435 09/18/14 0610 09/19/14 0315  NA 147 141 141 143 145 145 147  K 4.9 4.3 3.9 3.7 4.0 3.5* 3.8  CL 108 102 103 105 105 103 106  CO2 29 25 25 27 28 30 30   GLUCOSE 213* 140* 118* 113* 84 96 91  BUN 45* 61* 67* 66* 60* 48* 39*  CREATININE 1.83*  3.26* 2.65* 2.38* 2.09* 1.53* 1.27  CALCIUM 8.1* 7.7* 7.7* 8.0* 8.1* 8.3* 8.0*  MG  --  2.2 2.4 2.6* 2.6*  --   --   PHOS  --  5.5* 4.2 3.5 2.7  --   --    Liver Function Tests:  Recent Labs Lab 09/14/14 0317 09/15/14 0629  AST 15 13  ALT 12 11  ALKPHOS 73 70  BILITOT 1.4* 3.1*  PROT 5.2* 5.1*  ALBUMIN 2.4* 2.2*   No results found for this basename: LIPASE, AMYLASE,  in the last 168 hours No results found for this basename: AMMONIA,  in the last 168 hours CBC:  Recent Labs Lab 09/12/14 1340  09/15/14 0629 09/16/14 0500  09/17/14 0435 09/18/14 0610 09/19/14 0315  WBC 7.6  < > 10.1 8.7 7.6 6.8 6.3  NEUTROABS 7.3  --   --  7.0 5.6  --   --   HGB 8.8*  < > 8.2* 7.7* 8.7* 9.7* 9.1*  HCT 27.2*  < > 24.5* 23.6* 26.8* 30.8* 29.0*  MCV 100.0  < > 91.1 92.9 96.8 98.1 97.0  PLT 278  < > 242 247 293 322 331  < > = values in this interval not displayed. Cardiac Enzymes:  Recent Labs Lab 09/12/14 1618 09/12/14 1915 09/13/14 1115  TROPONINI 3.74* 3.11* 1.93*   BNP (last 3 results)  Recent Labs  09/12/14 0445  PROBNP 5726.0*   CBG:  Recent Labs Lab 09/18/14 0649 09/18/14 1040 09/18/14 1652 09/18/14 2203 09/19/14 0621  GLUCAP 104* 119* 97 86 91    Recent Results (from the past 240 hour(s))  CULTURE, BLOOD (ROUTINE X 2)     Status: None   Collection Time    09/12/14  4:50 AM      Result Value Ref Range Status   Specimen Description BLOOD WRIST RIGHT   Final   Special Requests BOTTLES DRAWN AEROBIC ONLY 5ML   Final   Culture  Setup Time     Final   Value: 09/12/2014 09:08     Performed at [place]     Final   Value: NO GROWTH 5 DAYS     Performed at [facility]   Report Status 09/18/2014 FINAL   Final  URINE CULTURE     Status: None   Collection Time    09/12/14  5:16 AM      Result Value Ref Range Status   Specimen Description URINE, CATHETERIZED   Final   Special Requests NONE   Final   Culture  Setup Time     Final   Value: 09/12/2014 09:28     Performed at [place]     Final   Value: NO GROWTH     Performed at [facility]   Culture     Final   Value: NO GROWTH     Performed at [facility]   Report Status 09/13/2014 FINAL   Final  CULTURE, BLOOD (ROUTINE X 2)     Status: None   Collection Time    09/12/14  6:20 AM      Result Value Ref Range Status   Specimen Description BLOOD ARM RIGHT   Final   Special Requests BOTTLES DRAWN AEROBIC AND ANAEROBIC 4ML   Final   Culture  Setup Time     Final    Value: 09/12/2014 12:30     Performed at [facility]  Culture     Final   Value: NO GROWTH 5 DAYS     Performed at [facility]   Report Status 09/18/2014 FINAL   Final  MRSA PCR SCREENING     Status: None   Collection Time    09/12/14  9:02 AM      Result Value Ref Range Status   MRSA by PCR NEGATIVE  NEGATIVE Final   Comment:            The GeneXpert MRSA Assay (FDA     approved for NASAL specimens     only), is one component of a     comprehensive MRSA colonization     surveillance program. It is not     intended to diagnose MRSA     infection nor to guide or     monitor treatment for     MRSA infections.     Studies: No results found.  Scheduled Meds: . antiseptic oral rinse  7 mL Mouth Rinse BID  . feeding supplement (ENSURE COMPLETE)  237 mL Oral BID PC  . furosemide  40 mg Intravenous Q12H  . insulin aspart  0-15 Units Subcutaneous TID WC  . insulin aspart  0-5 Units Subcutaneous QHS  . levofloxacin  750 mg Oral Q48H  . metoprolol tartrate  12.5 mg Oral BID  . multivitamin with minerals  1 tablet Oral Daily  . simvastatin  20 mg Per Tube QPM   Continuous Infusions:    Charlynne Cousins  [facility] Pager [phone]. If 8PM-8AM, please contact night-coverage at [email or web address], password [facility] 09/19/2014, 10:10 AM  LOS: 7 days

## 2014-09-20 DIAGNOSIS — I5033 Acute on chronic diastolic (congestive) heart failure: Principal | ICD-10-CM

## 2014-09-20 DIAGNOSIS — J810 Acute pulmonary edema: Secondary | ICD-10-CM

## 2014-09-20 DIAGNOSIS — R7989 Other specified abnormal findings of blood chemistry: Secondary | ICD-10-CM

## 2014-09-20 LAB — CBC
HCT: 29.9 % — ABNORMAL LOW (ref 39.0–52.0)
Hemoglobin: 9.4 g/dL — ABNORMAL LOW (ref 13.0–17.0)
MCH: 30.9 pg (ref 26.0–34.0)
MCHC: 31.4 g/dL (ref 30.0–36.0)
MCV: 98.4 fL (ref 78.0–100.0)
PLATELETS: 331 10*3/uL (ref 150–400)
RBC: 3.04 MIL/uL — ABNORMAL LOW (ref 4.22–5.81)
RDW: 16.8 % — AB (ref 11.5–15.5)
WBC: 6.8 10*3/uL (ref 4.0–10.5)

## 2014-09-20 LAB — BASIC METABOLIC PANEL
Anion gap: 10 (ref 5–15)
BUN: 35 mg/dL — ABNORMAL HIGH (ref 6–23)
CALCIUM: 7.9 mg/dL — AB (ref 8.4–10.5)
CO2: 30 mEq/L (ref 19–32)
Chloride: 105 mEq/L (ref 96–112)
Creatinine, Ser: 1.23 mg/dL (ref 0.50–1.35)
GFR calc non Af Amer: 52 mL/min — ABNORMAL LOW (ref >90)
GFR, EST AFRICAN AMERICAN: 61 mL/min — AB (ref >90)
Glucose, Bld: 91 mg/dL (ref 70–99)
POTASSIUM: 3.7 meq/L (ref 3.7–5.3)
Sodium: 145 mEq/L (ref 137–147)

## 2014-09-20 LAB — GLUCOSE, CAPILLARY
GLUCOSE-CAPILLARY: 100 mg/dL — AB (ref 70–99)
Glucose-Capillary: 96 mg/dL (ref 70–99)

## 2014-09-20 MED ORDER — FUROSEMIDE 10 MG/ML IJ SOLN
80.0000 mg | Freq: Once | INTRAMUSCULAR | Status: AC
  Administered 2014-09-20: 80 mg via INTRAVENOUS
  Filled 2014-09-20: qty 8

## 2014-09-20 MED ORDER — LEVOFLOXACIN 750 MG PO TABS: 750.0000 mg | ORAL_TABLET | ORAL | Status: DC

## 2014-09-20 MED ORDER — FUROSEMIDE 20 MG PO TABS: 20.0000 mg | ORAL_TABLET | Freq: Every day | ORAL | Status: DC

## 2014-09-20 NOTE — Discharge Summary (Signed)
Physician Discharge Summary  [name] [name] JIR:[id number] DOB: 05-18-1929 DOA: 09/12/2014  PCP: Abigail Miyamoto, MD  Admit date: 09/12/2014 Discharge date: 09/20/2014  Time spent: 35 minutes  Recommendations for Outpatient Follow-up:  1. Follow up with cardiology in 1 week. 2. Follow up with PCP.  BNP    Component Value Date/Time   PROBNP 5726.0* 09/12/2014 0445   Filed Weights   09/18/14 0554 09/19/14 0614 09/20/14 0501  Weight: 71.9 kg (158 lb 8.2 oz) 71.895 kg (158 lb 8 oz) 70.988 kg (156 lb 8 oz)     Discharge Diagnoses:  Principal Problem:   Acute respiratory failure Active Problems:   Acute kidney failure   Hyperlipidemia   CAD (coronary artery disease)   Hemorrhagic shock   Diastolic CHF, acute on chronic   Intraperitoneal hemorrhage   Acute blood loss anemia   Elevated troponin I level   Discharge Condition: Stable  Diet recommendation: low sodium heart rate.    History of present illness:  80 year old male presented to [facility] ED 9/25 with acute onset SOB. Profoundly hypoxic in ED with sat 50%. Was intubated in ED. CXR consistent with edema. PCCM to admit   Hospital Course:  Acute respiratory failure  - Secondary to acute on chronic diastolic CHF exacerbation, plus or minus pneumonia.  - Patient with clinical improvement.  - Cardiac enzymes were elevated and was felt this was secondary to demand ischemia. Continue Lopressor and Zocor.   Acute on chronic diastolic heart failure  - Troponins were elevated. 2-D echo with EF of 50% with hypokinesis of anteroseptal myocardial. Consulted cardiology - Continue beta blocker. - Started on lasix IV and overload improved. - Unknown estimated dry weight. Weight on d/c 70.9 kg. - cont ACE-, betablocker and lasix low dose.  Elevated Troponin's in the setting of Demand ischemia:  - 2-D echo with EF of 50% with hypokinesis of the anteroseptal myocardium.  - Cardiology consulted rec medical management for now.  - Continue  beta blocker, statin.   Acute renal failure: - Likely secondary to acute on chronic diastolic heart failure with hypotension.   Spontaneous Intraperitoneal hemorrhage  - Patient is being followed by Gen. surgery. Abdomen is soft   Acute blood loss anemia  - Secondary to Spontaneous intraperitoneal hemorrhage.  - Patient is status post 1 unit packed red blood cells 09/14/2014.  - Hemoglobin stable. Follow. Transfusion threshold hemoglobin less than 7.      Procedures: 2-D echo 09/12/2014  CT abdomen and pelvis 09/13/2014  Abdominal x-ray 09/16/2014  Chest x-ray 09/13/2014, 09/14/2014, 09/15/2014, 09/16/2014  1 unit packed red blood cells 09/14/2014.   Consultations: Critical care medicine Dr. Curt Jews 09/12/2014  Cardiology: Dr Haroldine Laws 09/12/2014 General surgery: Dr. Georganna Skeans 09/13/2014    Discharge Exam: Filed Vitals:   09/20/14 0501  BP: 117/43  Pulse: 82  Temp: 97.9 F (36.6 C)  Resp: 18    General: A&O x3 Cardiovascular: RRR Respiratory: good air movement CTA B/L  Discharge Instructions  Discharge Instructions   Diet - low sodium heart healthy    Complete by:  As directed      Increase activity slowly    Complete by:  As directed             Medication List    STOP taking these medications       amLODipine 10 MG tablet  Commonly known as:  NORVASC      TAKE these medications       aspirin  81 MG tablet  Take 81 mg by mouth daily.     benazepril 20 MG tablet  Commonly known as:  LOTENSIN  Take 20 mg by mouth daily.     furosemide 20 MG tablet  Commonly known as:  LASIX  Take 1 tablet (20 mg total) by mouth daily.     hydrochlorothiazide 25 MG tablet  Commonly known as:  HYDRODIURIL  Take 25 mg by mouth daily.     levofloxacin 750 MG tablet  Commonly known as:  LEVAQUIN  Take 1 tablet (750 mg total) by mouth every other day.     metoprolol tartrate 25 MG tablet  Commonly known as:  LOPRESSOR  Take 1 tablet (25 mg total) by  mouth 2 (two) times daily.     simvastatin 20 MG tablet  Commonly known as:  ZOCOR  Take 20 mg by mouth every evening.       No Known Allergies     Follow-up Information   Follow up with [place]. (Registered Nurse and Physical Therapy Services to start within 24-48 hours of discharge home. )    Contact information:   [address] [place] [address] [phone]        The results of significant diagnostics from this hospitalization (including imaging, microbiology, ancillary and laboratory) are listed below for reference.    Significant Diagnostic Studies: Ct Abdomen Pelvis Wo Contrast  09/13/2014   CLINICAL DATA:  Rapidly decreasing hemoglobin. Worry for retroperitoneal bleed. On heparin.  EXAM: CT ABDOMEN AND PELVIS WITHOUT CONTRAST  TECHNIQUE: Multidetector CT imaging of the abdomen and pelvis was performed following the standard protocol without IV contrast.  COMPARISON:  04/26/2013  FINDINGS: Small a moderate size bilateral pleural effusions with basilar atelectasis. New since previous study.  Diffuse free fluid throughout the abdomen and pelvis. Increased density of the fluid is compatible with blood in the setting of decreasing hemoglobin. Heterogeneous areas of increased density within the fluid may indicate peritoneal implants or clot. Source of the hemorrhage cannot be ascertained. If clinically indicated, IV contrast material enhanced scan may help to define a location of active hemorrhage. The mesenteric mass seen on previous study is not definitively identified today but could be obscured by the diffuse fluid and decompressed bowel.  The unenhanced appearance of the liver, spleen, gallbladder, pancreas, adrenal glands, kidneys, inferior vena cava, and retroperitoneal lymph nodes is unremarkable. Calcification of aorta without aneurysm. No abnormal retroperitoneal fluid collections. Enteric tube with tip in the proximal duodenum. Stomach and  small bowel are decompressed. No free air in the abdomen.  Pelvis: Foley catheter decompresses the bladder. There is a a right inguinal hernia containing fluid. No pelvic mass or lymphadenopathy is appreciated. Appendix is not identified. Diverticula in the sigmoid colon without evidence of diverticulitis. Degenerative changes in the spine. No destructive bone lesions appreciated.  IMPRESSION: Large amount of free fluid in the pelvis of increased density consistent with free intraperitoneal hemorrhage. Source of hemorrhage is not demonstrated on noncontrast imaging. Bilateral pleural effusions with basilar atelectasis also present. Right inguinal hernia containing fluid.  These results were called by telephone at the time of interpretation on 09/13/2014 at 7:04 am to [place]ved[place], the patient's nurse on the ICU, who verbally acknowledged these results.   Electronically Signed   By: Lucienne Capers M.D.   On: 09/13/2014 07:08   Dg Chest Port 1 View  09/16/2014   CLINICAL DATA:  80 year old male with respiratory failure, hemorrhagic shock, non ST  elevation myocardial infarction. Initial encounter.  EXAM: PORTABLE CHEST - 1 VIEW  COMPARISON:  09/15/2014 and earlier.  FINDINGS: Portable AP semi upright view at 0457 hrs. Extubated. Enteric tube removed. Stable right IJ central line. Stable lung volumes. Mild bibasilar patchy opacity has improved since 09/14/2014. No areas of worsening ventilation. No pneumothorax, pulmonary edema or definite effusion. Normal cardiac size and mediastinal contours.  IMPRESSION: 1. Extubated and enteric tube removed. 2. Mildly improved ventilation with stable lung volumes and mild bibasilar opacity favored to be atelectasis.   Electronically Signed   By: Lars Pinks M.D.   On: 09/16/2014 07:24   Dg Chest Port 1 View  09/15/2014   CLINICAL DATA:  ET tube position.  EXAM: PORTABLE CHEST - 1 VIEW  COMPARISON:  09/14/2014  FINDINGS: Endotracheal tube is 3 cm above the carina. Low lung volumes with  patchy bilateral airspace opacities, most pronounced in the right lower lobe, increased since prior study. Heart is borderline in size. No effusions. No acute bony abnormality.  IMPRESSION: Low lung volumes with increasing patchy bilateral airspace opacities, atelectasis versus pneumonia.   Electronically Signed   By: Rolm Baptise M.D.   On: 09/15/2014 07:20   Dg Chest Port 1 View  09/14/2014   CLINICAL DATA:  Evaluate endotracheal tube  EXAM: PORTABLE CHEST - 1 VIEW  COMPARISON:  Portable exam 0520 hr compared to 09/13/2014  FINDINGS: Tip of endotracheal tube projects approximately 2.6 cm above carina.  Nasogastric tube extends into stomach.  RIGHT jugular central venous catheter tip projects over cavoatrial junction.  Borderline enlargement of cardiac silhouette.  Mediastinal contours and pulmonary vascularity normal.  Bibasilar atelectasis.  No definite pulmonary infiltrate, pleural effusion or pneumothorax.  No acute osseous findings.  IMPRESSION: Bibasilar atelectasis slightly greater on LEFT.   Electronically Signed   By: Lavonia Dana M.D.   On: 09/14/2014 07:38   Dg Chest Port 1 View  09/13/2014   CLINICAL DATA:  Right IJ placement .  EXAM: PORTABLE CHEST - 1 VIEW  COMPARISON:  09/13/2014.  FINDINGS: Endotracheal tube and NG tube noted in stable position. Right IJ line noted in good anatomic position. Mediastinum and hilar structures normal. Basilar atelectasis. No pleural effusion or pneumothorax. No acute bony abnormality.  IMPRESSION: 1. Endotracheal tube and NG tube in stable position. Interim placement of a right IJ line, its tip is in good anatomic position. 2. Bibasilar atelectasis and/or mild infiltrates.   Electronically Signed   By: [place]   On: 09/13/2014 16:32   Dg Chest Port 1 View  09/13/2014   CLINICAL DATA:  Intubation.  EXAM: PORTABLE CHEST - 1 VIEW  COMPARISON:  09/12/2014.  FINDINGS: Endotracheal tube 1.6 cm above the carina. NG tube noted with tip below hemidiaphragms.  Mediastinum hilar structures are normal. Interim near complete clearing of bilateral pulmonary edema. Bibasilar atelectasis. Small pleural effusions noted. No pneumothorax. Heart size stable. Pulmonary vascularity normal.  IMPRESSION: 1. Endotracheal tube tip 1.6 cm above the carina. NG tube tip below left hemidiaphragm.  2. Interim near complete clearing of bilateral pulmonary edema. Mild bibasilar atelectasis and small pleural effusions noted.   Electronically Signed   By: [place]   On: 09/13/2014 09:18   Dg Chest Port 1 View  09/12/2014   CLINICAL DATA:  Post intubation.  EXAM: PORTABLE CHEST - 1 VIEW  COMPARISON:  Chest radiograph May 28, 2013  FINDINGS: Endotracheal tube tip projects 2.2 cm above the carina. Nasogastric tube side port projecting at  GE junction, distal tip not imaged.  The cardiac silhouette appears upper limits of normal in size, mediastinal silhouette is nonsuspicious, mildly calcified aortic knob. Central interstitial and alveolar airspace opacities. Small LEFT pleural effusion. No pneumothorax. Soft tissue planes and included osseous structures are nonsuspicious, mild degenerative change of thoracic spine.  IMPRESSION: Endotracheal tube tip projects 2.2 cm above the carina.  Borderline cardiomegaly. Central interstitial and alveolar prominence with small LEFT pleural effusion suggests acute pulmonary edema, less likely infectious process. Nasogastric tube side port projecting at GE junction, distal tip not imaged.   Electronically Signed   By: Elon Alas   On: 09/12/2014 05:50   Dg Abd 2 Views  09/16/2014   CLINICAL DATA:  Abdominal pain and distention.  EXAM: ABDOMEN - 2 VIEW  COMPARISON:  09/13/2014 CT.  FINDINGS: The bowel gas pattern is unremarkable.  No dilated bowel loops are present.  There is no evidence of pneumoperitoneum or suspicious calcifications.  IMPRESSION: No acute abnormalities.   Electronically Signed   By: Hassan Rowan M.D.   On: 09/16/2014 13:13     Microbiology: Recent Results (from the past 240 hour(s))  CULTURE, BLOOD (ROUTINE X 2)     Status: None   Collection Time    09/12/14  4:50 AM      Result Value Ref Range Status   Specimen Description BLOOD WRIST RIGHT   Final   Special Requests BOTTLES DRAWN AEROBIC ONLY 5ML   Final   Culture  Setup Time     Final   Value: 09/12/2014 09:08     Performed at [facility]   Culture     Final   Value: NO GROWTH 5 DAYS     Performed at [facility]   Report Status 09/18/2014 FINAL   Final  URINE CULTURE     Status: None   Collection Time    09/12/14  5:16 AM      Result Value Ref Range Status   Specimen Description URINE, CATHETERIZED   Final   Special Requests NONE   Final   Culture  Setup Time     Final   Value: 09/12/2014 09:28     Performed at [place]     Final   Value: NO GROWTH     Performed at [facility]   Culture     Final   Value: NO GROWTH     Performed at [facility]   Report Status 09/13/2014 FINAL   Final  CULTURE, BLOOD (ROUTINE X 2)     Status: None   Collection Time    09/12/14  6:20 AM      Result Value Ref Range Status   Specimen Description BLOOD ARM RIGHT   Final   Special Requests BOTTLES DRAWN AEROBIC AND ANAEROBIC 4ML   Final   Culture  Setup Time     Final   Value: 09/12/2014 12:30     Performed at [facility]   Culture     Final   Value: NO GROWTH 5 DAYS     Performed at [facility]   Report Status 09/18/2014 FINAL   Final  MRSA PCR SCREENING     Status: None   Collection Time    09/12/14  9:02 AM      Result Value Ref Range Status   MRSA by PCR NEGATIVE  NEGATIVE Final   Comment:  The GeneXpert MRSA Assay (FDA     approved for NASAL specimens     only), is one component of a     comprehensive MRSA colonization     surveillance program. It is not     intended to diagnose MRSA     infection nor to guide or     monitor treatment for     MRSA  infections.     Labs: Basic Metabolic Panel:  Recent Labs Lab 09/14/14 0317 09/15/14 0629 09/16/14 0500 09/17/14 0435 09/18/14 0610 09/19/14 0315 09/20/14 0337  NA 141 141 143 145 145 147 145  K 4.3 3.9 3.7 4.0 3.5* 3.8 3.7  CL 102 103 105 105 103 106 105  CO2 25 25 27 28 30 30 30   GLUCOSE 140* 118* 113* 84 96 91 91  BUN 61* 67* 66* 60* 48* 39* 35*  CREATININE 3.26* 2.65* 2.38* 2.09* 1.53* 1.27 1.23  CALCIUM 7.7* 7.7* 8.0* 8.1* 8.3* 8.0* 7.9*  MG 2.2 2.4 2.6* 2.6*  --   --   --   PHOS 5.5* 4.2 3.5 2.7  --   --   --    Liver Function Tests:  Recent Labs Lab 09/14/14 0317 09/15/14 0629  AST 15 13  ALT 12 11  ALKPHOS 73 70  BILITOT 1.4* 3.1*  PROT 5.2* 5.1*  ALBUMIN 2.4* 2.2*   No results found for this basename: LIPASE, AMYLASE,  in the last 168 hours No results found for this basename: AMMONIA,  in the last 168 hours CBC:  Recent Labs Lab 09/16/14 0500 09/17/14 0435 09/18/14 0610 09/19/14 0315 09/20/14 0337  WBC 8.7 7.6 6.8 6.3 6.8  NEUTROABS 7.0 5.6  --   --   --   HGB 7.7* 8.7* 9.7* 9.1* 9.4*  HCT 23.6* 26.8* 30.8* 29.0* 29.9*  MCV 92.9 96.8 98.1 97.0 98.4  PLT 247 293 322 331 331   Cardiac Enzymes:  Recent Labs Lab 09/13/14 1115  TROPONINI 1.93*   BNP: BNP (last 3 results)  Recent Labs  09/12/14 0445  PROBNP 5726.0*   CBG:  Recent Labs Lab 09/19/14 0621 09/19/14 1104 09/19/14 1624 09/19/14 2127 09/20/14 0653  GLUCAP 91 134* 77 122* 100*       Signed:  FELIZ ORTIZ, Yunus [place]  [facility] 09/20/2014, 10:21 AM

## 2014-09-23 ENCOUNTER — Telehealth: Payer: Self-pay

## 2014-09-23 ENCOUNTER — Telehealth: Payer: Self-pay | Admitting: Cardiology

## 2014-09-23 DIAGNOSIS — I10 Essential (primary) hypertension: Secondary | ICD-10-CM | POA: Diagnosis not present

## 2014-09-23 DIAGNOSIS — I214 Non-ST elevation (NSTEMI) myocardial infarction: Secondary | ICD-10-CM | POA: Diagnosis not present

## 2014-09-23 DIAGNOSIS — I5032 Chronic diastolic (congestive) heart failure: Secondary | ICD-10-CM | POA: Diagnosis not present

## 2014-09-23 DIAGNOSIS — K661 Hemoperitoneum: Secondary | ICD-10-CM | POA: Diagnosis not present

## 2014-09-23 DIAGNOSIS — I5033 Acute on chronic diastolic (congestive) heart failure: Secondary | ICD-10-CM | POA: Diagnosis not present

## 2014-09-23 NOTE — Telephone Encounter (Signed)
SHE WANTED TO KNOW IF SHOULD RESTART ASPIRIN AS STATED ON DISCHARGE INSTRUCTION.  PATIENT STATED THAT THE WAS VERBALLY TOLD NOT TO TAKE ASIPIRN.  RN REVIEWED HOSPITALIZATION INFORMATION - PATIENT WAS NOT TAKING ASPIRIN. RN INFORMED MARIA RN - TO HOLD ASPIRIN UNTIL HE SEES CARDIOLOGIST. SHE STATES SHE WILL INFORM PATIENT.

## 2014-09-23 NOTE — Telephone Encounter (Signed)
Received a call from [facility] with [place] stating they are admitting pt to their services.Stated patient has hemorrhoids and needs Dr.Jordan to prescribe medication.Advised to call PCP.

## 2014-09-24 ENCOUNTER — Telehealth: Payer: Self-pay | Admitting: Cardiology

## 2014-09-24 DIAGNOSIS — I10 Essential (primary) hypertension: Secondary | ICD-10-CM | POA: Diagnosis not present

## 2014-09-24 DIAGNOSIS — I5033 Acute on chronic diastolic (congestive) heart failure: Secondary | ICD-10-CM | POA: Diagnosis not present

## 2014-09-24 DIAGNOSIS — I214 Non-ST elevation (NSTEMI) myocardial infarction: Secondary | ICD-10-CM | POA: Diagnosis not present

## 2014-09-24 DIAGNOSIS — K661 Hemoperitoneum: Secondary | ICD-10-CM | POA: Diagnosis not present

## 2014-09-24 NOTE — Telephone Encounter (Signed)
Closed encounter °

## 2014-09-25 DIAGNOSIS — K661 Hemoperitoneum: Secondary | ICD-10-CM | POA: Diagnosis not present

## 2014-09-25 DIAGNOSIS — D214 Benign neoplasm of connective and other soft tissue of abdomen: Secondary | ICD-10-CM | POA: Diagnosis not present

## 2014-09-25 DIAGNOSIS — I10 Essential (primary) hypertension: Secondary | ICD-10-CM | POA: Diagnosis not present

## 2014-09-25 DIAGNOSIS — D62 Acute posthemorrhagic anemia: Secondary | ICD-10-CM | POA: Diagnosis not present

## 2014-09-25 DIAGNOSIS — I5023 Acute on chronic systolic (congestive) heart failure: Secondary | ICD-10-CM | POA: Diagnosis not present

## 2014-09-25 DIAGNOSIS — N529 Male erectile dysfunction, unspecified: Secondary | ICD-10-CM | POA: Diagnosis not present

## 2014-09-25 DIAGNOSIS — E782 Mixed hyperlipidemia: Secondary | ICD-10-CM | POA: Diagnosis not present

## 2014-09-25 DIAGNOSIS — I2510 Atherosclerotic heart disease of native coronary artery without angina pectoris: Secondary | ICD-10-CM | POA: Diagnosis not present

## 2014-09-26 DIAGNOSIS — I5033 Acute on chronic diastolic (congestive) heart failure: Secondary | ICD-10-CM | POA: Diagnosis not present

## 2014-09-26 DIAGNOSIS — K661 Hemoperitoneum: Secondary | ICD-10-CM | POA: Diagnosis not present

## 2014-09-26 DIAGNOSIS — I10 Essential (primary) hypertension: Secondary | ICD-10-CM | POA: Diagnosis not present

## 2014-09-26 DIAGNOSIS — I214 Non-ST elevation (NSTEMI) myocardial infarction: Secondary | ICD-10-CM | POA: Diagnosis not present

## 2014-09-29 DIAGNOSIS — I10 Essential (primary) hypertension: Secondary | ICD-10-CM | POA: Diagnosis not present

## 2014-09-29 DIAGNOSIS — K661 Hemoperitoneum: Secondary | ICD-10-CM | POA: Diagnosis not present

## 2014-09-29 DIAGNOSIS — D62 Acute posthemorrhagic anemia: Secondary | ICD-10-CM | POA: Diagnosis not present

## 2014-09-29 DIAGNOSIS — D214 Benign neoplasm of connective and other soft tissue of abdomen: Secondary | ICD-10-CM | POA: Diagnosis not present

## 2014-09-29 DIAGNOSIS — N529 Male erectile dysfunction, unspecified: Secondary | ICD-10-CM | POA: Diagnosis not present

## 2014-09-29 DIAGNOSIS — E782 Mixed hyperlipidemia: Secondary | ICD-10-CM | POA: Diagnosis not present

## 2014-09-29 DIAGNOSIS — I2510 Atherosclerotic heart disease of native coronary artery without angina pectoris: Secondary | ICD-10-CM | POA: Diagnosis not present

## 2014-09-29 DIAGNOSIS — I5023 Acute on chronic systolic (congestive) heart failure: Secondary | ICD-10-CM | POA: Diagnosis not present

## 2014-09-30 DIAGNOSIS — I214 Non-ST elevation (NSTEMI) myocardial infarction: Secondary | ICD-10-CM | POA: Diagnosis not present

## 2014-09-30 DIAGNOSIS — K661 Hemoperitoneum: Secondary | ICD-10-CM | POA: Diagnosis not present

## 2014-09-30 DIAGNOSIS — I10 Essential (primary) hypertension: Secondary | ICD-10-CM | POA: Diagnosis not present

## 2014-09-30 DIAGNOSIS — I5033 Acute on chronic diastolic (congestive) heart failure: Secondary | ICD-10-CM | POA: Diagnosis not present

## 2014-10-02 DIAGNOSIS — I214 Non-ST elevation (NSTEMI) myocardial infarction: Secondary | ICD-10-CM | POA: Diagnosis not present

## 2014-10-02 DIAGNOSIS — I10 Essential (primary) hypertension: Secondary | ICD-10-CM | POA: Diagnosis not present

## 2014-10-02 DIAGNOSIS — I5033 Acute on chronic diastolic (congestive) heart failure: Secondary | ICD-10-CM | POA: Diagnosis not present

## 2014-10-02 DIAGNOSIS — K661 Hemoperitoneum: Secondary | ICD-10-CM | POA: Diagnosis not present

## 2014-10-03 DIAGNOSIS — I5033 Acute on chronic diastolic (congestive) heart failure: Secondary | ICD-10-CM | POA: Diagnosis not present

## 2014-10-03 DIAGNOSIS — I214 Non-ST elevation (NSTEMI) myocardial infarction: Secondary | ICD-10-CM | POA: Diagnosis not present

## 2014-10-03 DIAGNOSIS — K661 Hemoperitoneum: Secondary | ICD-10-CM | POA: Diagnosis not present

## 2014-10-03 DIAGNOSIS — I10 Essential (primary) hypertension: Secondary | ICD-10-CM | POA: Diagnosis not present

## 2014-10-06 DIAGNOSIS — K661 Hemoperitoneum: Secondary | ICD-10-CM | POA: Diagnosis not present

## 2014-10-06 DIAGNOSIS — I5033 Acute on chronic diastolic (congestive) heart failure: Secondary | ICD-10-CM | POA: Diagnosis not present

## 2014-10-06 DIAGNOSIS — I10 Essential (primary) hypertension: Secondary | ICD-10-CM | POA: Diagnosis not present

## 2014-10-06 DIAGNOSIS — I214 Non-ST elevation (NSTEMI) myocardial infarction: Secondary | ICD-10-CM | POA: Diagnosis not present

## 2014-10-07 ENCOUNTER — Telehealth: Payer: Self-pay | Admitting: Cardiology

## 2014-10-07 DIAGNOSIS — K4090 Unilateral inguinal hernia, without obstruction or gangrene, not specified as recurrent: Secondary | ICD-10-CM | POA: Diagnosis not present

## 2014-10-07 DIAGNOSIS — I2510 Atherosclerotic heart disease of native coronary artery without angina pectoris: Secondary | ICD-10-CM | POA: Diagnosis not present

## 2014-10-07 DIAGNOSIS — Z8509 Personal history of malignant neoplasm of other digestive organs: Secondary | ICD-10-CM | POA: Diagnosis not present

## 2014-10-07 DIAGNOSIS — K432 Incisional hernia without obstruction or gangrene: Secondary | ICD-10-CM | POA: Diagnosis not present

## 2014-10-07 NOTE — Telephone Encounter (Signed)
Received 3 pages records for patient from [facility] for appointment with Kerin Ransom, [place] on 10/09/14  Records given to [facility] (medical records) for Luke's schedule of 10/09/14 lp

## 2014-10-08 DIAGNOSIS — I10 Essential (primary) hypertension: Secondary | ICD-10-CM | POA: Diagnosis not present

## 2014-10-08 DIAGNOSIS — K661 Hemoperitoneum: Secondary | ICD-10-CM | POA: Diagnosis not present

## 2014-10-08 DIAGNOSIS — I5033 Acute on chronic diastolic (congestive) heart failure: Secondary | ICD-10-CM | POA: Diagnosis not present

## 2014-10-08 DIAGNOSIS — I214 Non-ST elevation (NSTEMI) myocardial infarction: Secondary | ICD-10-CM | POA: Diagnosis not present

## 2014-10-09 ENCOUNTER — Ambulatory Visit (INDEPENDENT_AMBULATORY_CARE_PROVIDER_SITE_OTHER): Payer: Medicare Other | Admitting: Cardiology

## 2014-10-09 ENCOUNTER — Telehealth: Payer: Self-pay

## 2014-10-09 ENCOUNTER — Encounter: Payer: Self-pay | Admitting: Cardiology

## 2014-10-09 VITALS — BP 136/70 | HR 73 | Ht 67.0 in | Wt 160.0 lb

## 2014-10-09 DIAGNOSIS — K661 Hemoperitoneum: Secondary | ICD-10-CM | POA: Diagnosis not present

## 2014-10-09 DIAGNOSIS — I214 Non-ST elevation (NSTEMI) myocardial infarction: Secondary | ICD-10-CM | POA: Diagnosis not present

## 2014-10-09 DIAGNOSIS — N179 Acute kidney failure, unspecified: Secondary | ICD-10-CM

## 2014-10-09 DIAGNOSIS — I5033 Acute on chronic diastolic (congestive) heart failure: Secondary | ICD-10-CM

## 2014-10-09 DIAGNOSIS — R109 Unspecified abdominal pain: Secondary | ICD-10-CM | POA: Diagnosis not present

## 2014-10-09 DIAGNOSIS — C179 Malignant neoplasm of small intestine, unspecified: Secondary | ICD-10-CM

## 2014-10-09 DIAGNOSIS — D62 Acute posthemorrhagic anemia: Secondary | ICD-10-CM | POA: Diagnosis not present

## 2014-10-09 DIAGNOSIS — I2510 Atherosclerotic heart disease of native coronary artery without angina pectoris: Secondary | ICD-10-CM

## 2014-10-09 DIAGNOSIS — C49A3 Gastrointestinal stromal tumor of small intestine: Secondary | ICD-10-CM

## 2014-10-09 NOTE — Assessment & Plan Note (Signed)
After being placed on Heparin- Hgb dropped to 6.

## 2014-10-09 NOTE — Patient Instructions (Signed)
Your physician recommends that you schedule a follow-up appointment in: 6-8 Weeks with Dr Neita Garnet  Your physician has recommended you make the following change in your medication: Stop Lasix

## 2014-10-09 NOTE — Assessment & Plan Note (Addendum)
Admitted 09/12/14 with acute respiratory failure requiring intubation.  This was felt to be secondary to colonoscopy prep.

## 2014-10-09 NOTE — Progress Notes (Signed)
10/09/2014 [name] [name]   [birth year]  [id number]  Primary Physicia FRIED, Jaymes Graff, MD Primary Cardiologist: Dr Martinique  HPI:  80 y/o male with H/o CAD s/p NSTEMI in March of 2014. Found to have a large abdominal mass at that time. He was cathed and had an occluded LAD with excellent right to left collaterals. EF was 50%. LAD appeared suitable for PCI but given the abdominal mass,medical therapy was instituted. His other issues include HTN, HLD and anemia. Subsequent surgery June 2014 revealed the abdominal mass to be a stromal tumor and it was resected in.  In 8/14 a Myoview was Low risk with distal anterior wall apical and septal ischemia. It was a low risk scan only because of known LAD occlusion with collaterals. LV Ejection Fraction: 49%.             He was admitted with acute respiratory failure 09/12/14 and intubated. It was felt he had acute diastolic CHF secondary to a colonoscopy prep the preceding week. He was admitted and diuresed. His hospital course was complicated by acute hemorraghic shock secondary to a spontaneous retroperitoneal hematoma, a NSTEMI secondary to demand ischemia, and acute renal failure  This all eventually resolved. Echo on 09/12/14 showed an EF of 50%. He did have elevated pulmonary pressures but that was in the setting of acute pulmonary edema. He i in the office today for follow up. He has been doing well since discharge. He was told to push fluids. He has had some LE edema and is wearing compression stockings (new for him). He denies orthopnea or PND. He denies any DOE. He does have some residual hoarseness from his intubation in the ER.     Current Outpatient Prescriptions  Medication Sig Dispense Refill  . benazepril (LOTENSIN) 20 MG tablet Take 20 mg by mouth daily.      . Cyanocobalamin (B-12 PO) Take by mouth daily.      . hydrochlorothiazide (HYDRODIURIL) 25 MG tablet Take 25 mg by mouth daily.      . IRON PO Take by mouth daily.      . metoprolol  tartrate (LOPRESSOR) 25 MG tablet Take 1 tablet (25 mg total) by mouth 2 (two) times daily.  60 tablet  2  . simvastatin (ZOCOR) 20 MG tablet Take 20 mg by mouth every evening.       No current facility-administered medications for this visit.    No Known Allergies  History   Social History  . Marital Status: Married    Spouse Name: [name]    Number of Children: 2  . Years of Education: N/A   Occupational History  . Retired     Writer   Social History Main Topics  . Smoking status: Never Smoker   . Smokeless tobacco: Never Used  . Alcohol Use: No  . Drug Use: No  . Sexual Activity: No   Other Topics Concern  . Not on file   Social History Narrative   Married, wife [name]   Retired Environmental manager for 73 years   Independent in [place]   #2 grown sons     Review of Systems: General: negative for chills, fever, night sweats or weight changes.  Cardiovascular: negative for chest pain, dyspnea on exertion, edema, orthopnea, palpitations, paroxysmal nocturnal dyspnea or shortness of breath Dermatological: negative for rash Respiratory: negative for cough or wheezing Urologic: negative for hematuria Abdominal: negative for nausea, vomiting, diarrhea, bright red blood per rectum,  melena, or hematemesis Neurologic: negative for visual changes, syncope, or dizziness All other systems reviewed and are otherwise negative except as noted above.    Blood pressure 136/70, pulse 73, height 5\' 7"  (1.702 m), weight 160 lb (72.576 kg).  General appearance: alert, cooperative, appears stated age and no distress Neck: no carotid bruit and no JVD Lungs: clear to auscultation bilaterally Heart: regular rate and rhythm Extremities: trace edema with compression stockings in place.    ASSESSMENT AND PLAN:   Diastolic CHF, acute on chronic Admitted 09/12/14 with acute respiratory failure requiring intubation.  This was felt to be secondary to colonoscopy prep.    Intraperitoneal hemorrhage After being placed on Heparin- Hgb dropped to 6.  NSTEMI (non-ST elevated myocardial infarction) Secondary to demand ischemia from acute anemia and hypotension. Echo showed EF to be 50 %  CAD (coronary artery disease) Cath March 2014- occluded LAD with collaterals. Treated medically then secondary to abdominal mass (stromal tumor).  GIST (gastrointestinal stromal tumor) of small bowel, malignant Surgery June 2014  Acute kidney failure In the setting of acute hemorraghic shock, SCr normalized by discharge.    PLAN  He is on HCTZ and Lasix 20 mg. I suggested he stop pushing fluids, avoid salt, and stop his Lasix which is new. He can continue his HCTZ. He will keep an eye on his LE edema. He should see Dr Martinique in 6-8 weeks. Discussed above with the pt, his wife and son.   Kaj Vasil KPA-C 10/09/2014 12:09 PM

## 2014-10-09 NOTE — Assessment & Plan Note (Signed)
Secondary to demand ischemia from acute anemia and hypotension. Echo showed EF to be 50 %

## 2014-10-09 NOTE — Telephone Encounter (Signed)
Patient called no answer.Left message on personal voice mail to call me back 10/10/14 to schedule 6 to 8 week appointment with Dr.Jordan.

## 2014-10-09 NOTE — Assessment & Plan Note (Signed)
Cath March 2014- occluded LAD with collaterals. Treated medically then secondary to abdominal mass (stromal tumor).

## 2014-10-09 NOTE — Assessment & Plan Note (Signed)
In the setting of acute hemorraghic shock, SCr normalized by discharge.

## 2014-10-09 NOTE — Assessment & Plan Note (Signed)
Surgery June 2014

## 2014-10-10 DIAGNOSIS — I5033 Acute on chronic diastolic (congestive) heart failure: Secondary | ICD-10-CM | POA: Diagnosis not present

## 2014-10-10 DIAGNOSIS — I214 Non-ST elevation (NSTEMI) myocardial infarction: Secondary | ICD-10-CM | POA: Diagnosis not present

## 2014-10-10 DIAGNOSIS — I10 Essential (primary) hypertension: Secondary | ICD-10-CM | POA: Diagnosis not present

## 2014-10-10 DIAGNOSIS — K661 Hemoperitoneum: Secondary | ICD-10-CM | POA: Diagnosis not present

## 2014-10-15 ENCOUNTER — Telehealth: Payer: Self-pay | Admitting: Cardiology

## 2014-10-15 DIAGNOSIS — D62 Acute posthemorrhagic anemia: Secondary | ICD-10-CM | POA: Diagnosis not present

## 2014-10-16 ENCOUNTER — Emergency Department (HOSPITAL_COMMUNITY)
Admission: EM | Admit: 2014-10-16 | Discharge: 2014-10-16 | Disposition: A | Discharge status: Home / Self Care | Payer: Medicare Other | Attending: Emergency Medicine | Admitting: Emergency Medicine

## 2014-10-16 ENCOUNTER — Other Ambulatory Visit: Payer: Self-pay | Admitting: Gastroenterology

## 2014-10-16 ENCOUNTER — Ambulatory Visit
Admission: RE | Admit: 2014-10-16 | Discharge: 2014-10-16 | Disposition: A | Discharge status: Home / Self Care | Payer: Medicare Other | Source: Ambulatory Visit | Attending: Gastroenterology | Admitting: Gastroenterology

## 2014-10-16 ENCOUNTER — Encounter (HOSPITAL_COMMUNITY): Payer: Self-pay | Admitting: Emergency Medicine

## 2014-10-16 ENCOUNTER — Telehealth: Payer: Self-pay | Admitting: *Deleted

## 2014-10-16 DIAGNOSIS — C179 Malignant neoplasm of small intestine, unspecified: Secondary | ICD-10-CM | POA: Diagnosis not present

## 2014-10-16 DIAGNOSIS — Z79899 Other long term (current) drug therapy: Secondary | ICD-10-CM | POA: Insufficient documentation

## 2014-10-16 DIAGNOSIS — Z86018 Personal history of other benign neoplasm: Secondary | ICD-10-CM | POA: Diagnosis not present

## 2014-10-16 DIAGNOSIS — I252 Old myocardial infarction: Secondary | ICD-10-CM | POA: Diagnosis not present

## 2014-10-16 DIAGNOSIS — I10 Essential (primary) hypertension: Secondary | ICD-10-CM | POA: Diagnosis not present

## 2014-10-16 DIAGNOSIS — K8020 Calculus of gallbladder without cholecystitis without obstruction: Secondary | ICD-10-CM | POA: Diagnosis not present

## 2014-10-16 DIAGNOSIS — J189 Pneumonia, unspecified organism: Secondary | ICD-10-CM

## 2014-10-16 DIAGNOSIS — C762 Malignant neoplasm of abdomen: Secondary | ICD-10-CM | POA: Diagnosis present

## 2014-10-16 DIAGNOSIS — R578 Other shock: Secondary | ICD-10-CM

## 2014-10-16 DIAGNOSIS — Z8709 Personal history of other diseases of the respiratory system: Secondary | ICD-10-CM | POA: Diagnosis not present

## 2014-10-16 DIAGNOSIS — R109 Unspecified abdominal pain: Secondary | ICD-10-CM

## 2014-10-16 DIAGNOSIS — Z87440 Personal history of urinary (tract) infections: Secondary | ICD-10-CM | POA: Diagnosis not present

## 2014-10-16 DIAGNOSIS — C801 Malignant (primary) neoplasm, unspecified: Secondary | ICD-10-CM | POA: Diagnosis not present

## 2014-10-16 DIAGNOSIS — Z9889 Other specified postprocedural states: Secondary | ICD-10-CM | POA: Insufficient documentation

## 2014-10-16 DIAGNOSIS — I214 Non-ST elevation (NSTEMI) myocardial infarction: Secondary | ICD-10-CM

## 2014-10-16 DIAGNOSIS — C799 Secondary malignant neoplasm of unspecified site: Secondary | ICD-10-CM

## 2014-10-16 DIAGNOSIS — I2510 Atherosclerotic heart disease of native coronary artery without angina pectoris: Secondary | ICD-10-CM | POA: Diagnosis not present

## 2014-10-16 DIAGNOSIS — Z85028 Personal history of other malignant neoplasm of stomach: Secondary | ICD-10-CM | POA: Diagnosis not present

## 2014-10-16 DIAGNOSIS — I5033 Acute on chronic diastolic (congestive) heart failure: Secondary | ICD-10-CM

## 2014-10-16 DIAGNOSIS — D649 Anemia, unspecified: Secondary | ICD-10-CM | POA: Diagnosis present

## 2014-10-16 DIAGNOSIS — E785 Hyperlipidemia, unspecified: Secondary | ICD-10-CM | POA: Diagnosis not present

## 2014-10-16 DIAGNOSIS — Z8701 Personal history of pneumonia (recurrent): Secondary | ICD-10-CM | POA: Diagnosis not present

## 2014-10-16 DIAGNOSIS — K661 Hemoperitoneum: Secondary | ICD-10-CM

## 2014-10-16 DIAGNOSIS — D62 Acute posthemorrhagic anemia: Secondary | ICD-10-CM | POA: Diagnosis not present

## 2014-10-16 DIAGNOSIS — R7989 Other specified abnormal findings of blood chemistry: Secondary | ICD-10-CM

## 2014-10-16 DIAGNOSIS — R55 Syncope and collapse: Secondary | ICD-10-CM

## 2014-10-16 DIAGNOSIS — R140 Abdominal distension (gaseous): Secondary | ICD-10-CM | POA: Diagnosis present

## 2014-10-16 DIAGNOSIS — K4020 Bilateral inguinal hernia, without obstruction or gangrene, not specified as recurrent: Secondary | ICD-10-CM | POA: Diagnosis not present

## 2014-10-16 DIAGNOSIS — R1900 Intra-abdominal and pelvic swelling, mass and lump, unspecified site: Secondary | ICD-10-CM | POA: Diagnosis not present

## 2014-10-16 DIAGNOSIS — R778 Other specified abnormalities of plasma proteins: Secondary | ICD-10-CM

## 2014-10-16 DIAGNOSIS — C49A3 Gastrointestinal stromal tumor of small intestine: Secondary | ICD-10-CM | POA: Diagnosis present

## 2014-10-16 DIAGNOSIS — D509 Iron deficiency anemia, unspecified: Secondary | ICD-10-CM | POA: Diagnosis not present

## 2014-10-16 LAB — CBC WITH DIFFERENTIAL/PLATELET
BASOS PCT: 1 % (ref 0–1)
Basophils Absolute: 0 10*3/uL (ref 0.0–0.1)
EOS ABS: 0.2 10*3/uL (ref 0.0–0.7)
Eosinophils Relative: 4 % (ref 0–5)
HCT: 27.9 % — ABNORMAL LOW (ref 39.0–52.0)
HEMOGLOBIN: 8.9 g/dL — AB (ref 13.0–17.0)
LYMPHS ABS: 1.2 10*3/uL (ref 0.7–4.0)
Lymphocytes Relative: 18 % (ref 12–46)
MCH: 31.7 pg (ref 26.0–34.0)
MCHC: 31.9 g/dL (ref 30.0–36.0)
MCV: 99.3 fL (ref 78.0–100.0)
MONOS PCT: 8 % (ref 3–12)
Monocytes Absolute: 0.5 10*3/uL (ref 0.1–1.0)
NEUTROS PCT: 69 % (ref 43–77)
Neutro Abs: 4.5 10*3/uL (ref 1.7–7.7)
PLATELETS: 252 10*3/uL (ref 150–400)
RBC: 2.81 MIL/uL — ABNORMAL LOW (ref 4.22–5.81)
RDW: 16 % — ABNORMAL HIGH (ref 11.5–15.5)
WBC: 6.4 10*3/uL (ref 4.0–10.5)

## 2014-10-16 LAB — TYPE AND SCREEN
ABO/RH(D): O NEG
Antibody Screen: NEGATIVE

## 2014-10-16 LAB — COMPREHENSIVE METABOLIC PANEL
ALBUMIN: 3 g/dL — AB (ref 3.5–5.2)
ALT: 28 U/L (ref 0–53)
ANION GAP: 12 (ref 5–15)
AST: 21 U/L (ref 0–37)
Alkaline Phosphatase: 234 U/L — ABNORMAL HIGH (ref 39–117)
BUN: 30 mg/dL — AB (ref 6–23)
CALCIUM: 9.2 mg/dL (ref 8.4–10.5)
CO2: 27 mEq/L (ref 19–32)
Chloride: 105 mEq/L (ref 96–112)
Creatinine, Ser: 1.03 mg/dL (ref 0.50–1.35)
GFR calc non Af Amer: 65 mL/min — ABNORMAL LOW (ref >90)
GFR, EST AFRICAN AMERICAN: 75 mL/min — AB (ref >90)
GLUCOSE: 87 mg/dL (ref 70–99)
POTASSIUM: 4.2 meq/L (ref 3.7–5.3)
Sodium: 144 mEq/L (ref 137–147)
Total Bilirubin: 1.2 mg/dL (ref 0.3–1.2)
Total Protein: 6.7 g/dL (ref 6.0–8.3)

## 2014-10-16 LAB — PROTIME-INR
INR: 1.09 (ref 0.00–1.49)
Prothrombin Time: 14.2 seconds (ref 11.6–15.2)

## 2014-10-16 NOTE — ED Notes (Signed)
Dr J Knapp at bedside 

## 2014-10-16 NOTE — ED Provider Notes (Addendum)
CSN: [id number]     Arrival date & time 10/16/14  1318 History   First MD Initiated Contact with Patient 10/16/14 1551     Chief Complaint  Patient presents with  . Bloated    HPI Pt has been having some trouble with appetite loss and fatigue starting this past summer.  He was seeing his PCP.  They noticed he was developing anemia.  He had endoscopy and colonoscopy and the results were normal.  Patient ended up getting admitted to the hospital in September where he was noted to have intraperitoneal bleeding and required blood transfusion. The patient went to his GI doctor for continued outpatient follow-up today following his recent hospitalization in September.. They had noticed that he was becoming progressively more anemic. His repeat CT scan shows development of metastatic disease. His GI doctor sent him to the hospital for hospitalization.  Patient is denying any trouble with abdominal pain. He's not had any trouble with vomiting. He denies any fevers. He denies any Particular complaints right now. Past Medical History  Diagnosis Date  . Hyperlipidemia     TAKES zOCOR DAILY  . Abdominal mass 02/2013  . UTI (urinary tract infection)   . Hypertension     TAKES LOTREL,HCTZ,AND METOPROLOL DAILY  . NSTEMI (non-ST elevated myocardial infarction) 02/2013    "LIGHT: ONE MD SAID HE DID AND ONE SAID HE DIDN'T  . Pneumonia     MAR 2014  . Stromal tumor of digestive system   . CAD (coronary artery disease)   . Shortness of breath   . Acute respiratory failure with hypoxia 09/12/2014   Past Surgical History  Procedure Laterality Date  . Cyst excision  1962    wrist  . Cardiac catheterization  03-05-13  . Laparotomy N/A 06/04/2013    Procedure: EXPLORATORY LAPAROTOMY, OPEN RESECTION OF MESENTERIC AND INTESTINAL MASS;  Surgeon: [name], MD;  Location: [facility];  Service: General;  Laterality: N/A;  Small Bowel Resection  . Colonoscopy  09/08/2014    dr scooler   History reviewed. No  pertinent family history. History  Substance Use Topics  . Smoking status: Never Smoker   . Smokeless tobacco: Never Used  . Alcohol Use: No    Review of Systems  All other systems reviewed and are negative.     Allergies  Review of patient's allergies indicates no known allergies.  Home Medications   Prior to Admission medications   Medication Sig Start Date End Date Taking? Authorizing Provider  benazepril (LOTENSIN) 20 MG tablet Take 20 mg by mouth daily. 04/08/14  Yes Historical Provider, MD  Cyanocobalamin (B-12 PO) Take 1 tablet by mouth daily.    Yes Historical Provider, MD  hydrochlorothiazide (HYDRODIURIL) 25 MG tablet Take 25 mg by mouth daily.   Yes Historical Provider, MD  IRON PO Take 1 tablet by mouth daily.    Yes Historical Provider, MD  metoprolol tartrate (LOPRESSOR) 25 MG tablet Take 1 tablet (25 mg total) by mouth 2 (two) times daily. 03/06/13  Yes Costin Karlyne Greenspan, MD  simvastatin (ZOCOR) 20 MG tablet Take 20 mg by mouth every evening.   Yes Historical Provider, MD   BP 133/46  Pulse 84  Temp(Src) 98.1 F (36.7 C)  Resp 18  Ht 5\' 7"  (1.702 m)  Wt 160 lb (72.576 kg)  BMI 25.05 kg/m2  SpO2 100% Physical Exam  Nursing note and vitals reviewed. Constitutional: No distress.  HENT:  Head: Normocephalic and atraumatic.  Right Ear: External  ear normal.  Left Ear: External ear normal.  Eyes: Conjunctivae are normal. Right eye exhibits no discharge. Left eye exhibits no discharge. No scleral icterus.  Neck: Neck supple. No tracheal deviation present.  Cardiovascular: Normal rate, regular rhythm and intact distal pulses.   Pulmonary/Chest: Effort normal and breath sounds normal. No stridor. No respiratory distress. He has no wheezes. He has no rales.  Abdominal: Soft. Bowel sounds are normal. He exhibits no distension. There is no tenderness. There is no rebound and no guarding.  Musculoskeletal: He exhibits no edema and no tenderness.  Neurological: He is  alert. He has normal strength. No cranial nerve deficit (no facial droop, extraocular movements intact, no slurred speech) or sensory deficit. He exhibits normal muscle tone. He displays no seizure activity. Coordination normal.  Skin: Skin is warm and dry. No rash noted.  Psychiatric: He has a normal mood and affect.    ED Course  Procedures (including critical care time) Labs Review Labs Reviewed  CBC WITH DIFFERENTIAL - Abnormal; Notable for the following:    RBC 2.81 (*)    Hemoglobin 8.9 (*)    HCT 27.9 (*)    RDW 16.0 (*)    All other components within normal limits  COMPREHENSIVE METABOLIC PANEL - Abnormal; Notable for the following:    BUN 30 (*)    Albumin 3.0 (*)    Alkaline Phosphatase 234 (*)    GFR calc non Af Amer 65 (*)    GFR calc Af Amer 75 (*)    All other components within normal limits    Imaging Review Ct Abdomen Pelvis Wo Contrast  10/16/2014   CLINICAL DATA:  History intestinal tumor with resection of mesenteric mass 6/14. Recent blood transfusion. Abdominal pain and anemia. Peritoneal hemorrhage diagnosed 9/15.Abdominal pain, unspecified abdominal location R10.9 (ICD-10-CM). Resected mesenteric mass in June of 2014 demonstrating GI stromal tumor. 24% risk of malignant behavior per pathology.  EXAM: CT ABDOMEN AND PELVIS WITHOUT CONTRAST  TECHNIQUE: Multidetector CT imaging of the abdomen and pelvis was performed following the standard protocol without IV contrast.  COMPARISON:  Plain films 09/16/2014. CT 09/13/2014 and 04/26/2013. Surgical pathology report of June 2014.  FINDINGS: Lower chest: Mild left base interstitial thickening and scarring. Mild cardiomegaly with coronary artery atherosclerosis. Anemia, as evidenced by decreased density of the intravascular space. Trace left pleural fluid, decreased since the prior CT. The right pleural effusion is resolved.  Hepatobiliary: No gross intraparenchymal abnormality within the liver, given limitation of unenhanced  CT. Gallbladder sludge or small stones. No acute cholecystitis or biliary ductal dilatation.  Pancreas: Normal, without mass or pancreatic ductal dilatation.  Spleen: Normal  Adrenals/Urinary Tract: Normal adrenal glands. No renal calculi or hydronephrosis. Probable artifactual hyperattenuation in the right side of the bladder on image 73.  Stomach/Bowel: Collapsed stomach. Large amount of stool within the rectum. Extensive sigmoid diverticulosis. Normal caliber of the colon. No evidence of small bowel obstruction. Enterotomy changes within the jejunum.  Vascular/Lymphatic: Aortic and branch vessel atherosclerosis. No definite retroperitoneal adenopathy. No pelvic sidewall adenopathy.  Reproductive: Normal prostate.  Other: Small volume perihepatic ascites is decreased. Decrease trace cul-de-sac fluid. Minimal increased density within suggest complexity.  Large volume omental/peritoneal infiltrative masses. An area of omental caking measures 3.7 cm on image 46 of series 2. On the order of 2.0 cm on image 48 the prior exam. This suggests progression. Extensive soft tissue masses along the serosa of small bowel loops.  Bilateral inguinal hernias. The right-sided hernia contains either  complex fluid or peritoneal tumor. Left-sided hernia contains fat.  Musculoskeletal: No acute osseous abnormality. Degenerative disc disease at the lumbosacral junction.  IMPRESSION: 1. Massive omental/peritoneal disease which is likely related to metastatic disease from the patient's GI stromal tumor. This is progressive since 09/13/2014. These results will be called to the ordering clinician or representative by the Radiologist Assistant, and communication documented in the PACS or zVision Dashboard. 2. Decreased small volume abdominal pelvic fluid with mild complexity within. Favor malignant ascites. 3. Mild limitations secondary to lack of IV contrast and paucity of abdominal pelvic fat. 4. Decreased small left pleural effusion. 5.  Cholelithiasis 6. Favor artifactual hyperattenuation in the right bladder base. 7. Bilateral inguinal hernias, chronic. The right-sided hernia contains either complex fluid or peritoneal disease.   Electronically Signed   By: Abigail Miyamoto M.D.   On: 10/16/2014 12:32      MDM   Patient's outpatient CT scan showing metastatic disease. Patient's gastroenterologist referred him to the hospital for admission and further workup. Patient is medically stable at this point   Dorie Rank, MD 10/16/14 1628  Dr Conley Canal saw the patient in the ED.  She spoke with his oncologist Dr [name].  Pt does not need to be admitted to the hospital.  The patient is comfortable with outpatient workup.  Dorie Rank, MD 10/16/14 [phone]

## 2014-10-16 NOTE — Consult Note (Signed)
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 He is eating and having bowel movements, no obstruction. Hemoglobin is 8.9 which is very close to hemoglobin at discharge last month. Discussed the case with Dr. Julieanne Manson. No indication for admission but he will see the patient next week on Tuesday to arrange a probable biopsy. This may be metastatic disease from his SB GIST and would be likely responsive to [place]. Patient is agreeable to outpatient follow-up. Discussed with Dr. Tomi Bamberger who is agreeable to plan. I will discharge the patient.  Active Problems:   Chronic anemia: No active bleeding. Hemoglobin largely unchanged from discharge a month ago. No indication for transfusion but needs continued outpatient follow-up.   GIST (gastrointestinal stromal tumor) of small bowel, malignant   Hyperlipidemia   CAD (coronary artery disease), stable   Chief Complaint: Abdominal bloating. Abnormal CAT scan.  HPI:  With history small bowel GIST,  recent probable intra-peritoneal bleed diagnosed by CAT scan requiring blood transfusion who was seen by Dr. Michail Sermon in the office for follow-up. He had an EGD and colonoscopy for anemia and heme positive stool last month when he was hospitalized. He's had no rectal bleeding. His appetite is been good. However, he has had abdominal bloating. Outpatient CAT scan was done and showed probable massive omental/peritoneal disease likely related to GI stromal tumor. Sent to the emergency room for evaluation.  Review of Systems:  Systems reviewed. As above otherwise negative.  Past Medical History  Diagnosis Date  .  Hyperlipidemia     TAKES zOCOR DAILY  . Abdominal mass 02/2013  . UTI (urinary tract infection)   . Hypertension     TAKES LOTREL,HCTZ,AND METOPROLOL DAILY  . NSTEMI (non-ST elevated myocardial infarction) 02/2013    "LIGHT: ONE MD SAID HE DID AND ONE SAID HE DIDN'T  . Pneumonia     MAR 2014  . Stromal tumor of digestive system   . CAD (coronary artery disease)   . Shortness of breath   . Acute respiratory failure with hypoxia 09/12/2014   Past Surgical History  Procedure Laterality Date  . Cyst excision  1962    wrist  . Cardiac catheterization  03-05-13  . Laparotomy N/A 06/04/2013    Procedure: EXPLORATORY LAPAROTOMY, OPEN RESECTION OF MESENTERIC AND INTESTINAL MASS;  Surgeon: [name], MD;  Location: [facility];  Service: General;  Laterality: N/A;  Small Bowel Resection  . Colonoscopy  09/08/2014    dr scooler   Social History:  reports that he has never smoked. He has never used smokeless tobacco. He reports that he does not drink alcohol or use illicit drugs.  No Known Allergies History reviewed. No pertinent family history.  Prior to Admission medications   Medication Sig Start Date End Date Taking? Authorizing Provider  benazepril (LOTENSIN) 20 MG tablet Take 20 mg by mouth daily. 04/08/14  Yes Historical Provider, MD  Cyanocobalamin (B-12 PO) Take 1 tablet by mouth daily.    Yes Historical Provider, MD  hydrochlorothiazide (HYDRODIURIL) 25 MG tablet Take 25 mg by mouth daily.   Yes Historical Provider, MD  IRON PO Take 1 tablet by mouth daily.    Yes Historical Provider, MD  metoprolol tartrate (LOPRESSOR) 25 MG tablet Take  1 tablet (25 mg total) by mouth 2 (two) times daily. 03/06/13  Yes Costin Karlyne Greenspan, MD  simvastatin (ZOCOR) 20 MG tablet Take 20 mg by mouth every evening.   Yes Historical Provider, MD   Physical Exam: Blood pressure 123/48, pulse 78, temperature 98.1 F (36.7 C), resp. rate 23, height 5\' 7"  (1.702 m), weight 72.576 kg (160 lb), SpO2 96.00%. Filed  Vitals:   10/16/14 1700  BP: 123/48  Pulse: 78  Temp:   Resp: 23   BP 123/48  Pulse 82  Temp(Src) 98.1 F (36.7 C)  Resp 21  Ht 5\' 7"  (1.702 m)  Wt 72.576 kg (160 lb)  BMI 25.05 kg/m2  SpO2 96%  General Appearance:    Alert, cooperative, no distress, appears stated age  Head:    Normocephalic, without obvious abnormality, atraumatic  Eyes:    PERRL, slightly pale conjunctiva.       Nose:   Nares normal, septum midline, mucosa normal, no drainage   or sinus tenderness  Throat:   Lips, mucosa, and tongue normal; teeth and gums normal  Neck:   Supple, symmetrical, trachea midline, no adenopathy;       thyroid:  No enlargement/tenderness/nodules; no carotid   bruit or JVD  Back:     Symmetric, no curvature, ROM normal, no CVA tenderness  Lungs:     Clear to auscultation bilaterally, respirations unlabored  Chest wall:    No tenderness or deformity  Heart:    Regular rate and rhythm, S1 and S2 normal, no murmur, rub   or gallop  Abdomen:     Soft, non-tender, bowel sounds active distended   Genitalia:    deferred   Rectal:   deferred   Extremities:   Extremities normal, atraumatic, no cyanosis.  1+ edema  Pulses:   2+ and symmetric all extremities  Skin:   Skin color, texture, turgor normal, no rashes or lesions  Lymph nodes:   Cervical, supraclavicular, and axillary nodes normal  Neurologic:   CNII-XII intact. Normal strength, sensation and reflexes      throughout    Psychiatric: Normal affect   Labs on Admission:  Basic Metabolic Panel:  Recent Labs Lab 10/16/14 1325  NA 144  K 4.2  CL 105  CO2 27  GLUCOSE 87  BUN 30*  CREATININE 1.03  CALCIUM 9.2   Liver Function Tests:  Recent Labs Lab 10/16/14 1325  AST 21  ALT 28  ALKPHOS 234*  BILITOT 1.2  PROT 6.7  ALBUMIN 3.0*   No results found for this basename: LIPASE, AMYLASE,  in the last 168 hours No results found for this basename: AMMONIA,  in the last 168 hours CBC:  Recent Labs Lab  10/16/14 1325  WBC 6.4  NEUTROABS 4.5  HGB 8.9*  HCT 27.9*  MCV 99.3  PLT 252   Cardiac Enzymes: No results found for this basename: CKTOTAL, CKMB, CKMBINDEX, TROPONINI,  in the last 168 hours BNP: No components found with this basename: POCBNP,  CBG: No results found for this basename: GLUCAP,  in the last 168 hours  Radiological Exams on Admission: Ct Abdomen Pelvis Wo Contrast  10/16/2014   CLINICAL DATA:  History intestinal tumor with resection of mesenteric mass 6/14. Recent blood transfusion. Abdominal pain and anemia. Peritoneal hemorrhage diagnosed 9/15.Abdominal pain, unspecified abdominal location R10.9 (ICD-10-CM). Resected mesenteric mass in June of 2014 demonstrating GI stromal tumor. 24% risk of malignant behavior per pathology.  EXAM: CT ABDOMEN AND PELVIS WITHOUT CONTRAST  TECHNIQUE: Multidetector CT imaging of the abdomen and pelvis was performed following the standard protocol without IV contrast.  COMPARISON:  Plain films 09/16/2014. CT 09/13/2014 and 04/26/2013. Surgical pathology report of June 2014.  FINDINGS: Lower chest: Mild left base interstitial thickening and scarring. Mild cardiomegaly with coronary artery atherosclerosis. Anemia, as evidenced by decreased density of the intravascular space. Trace left pleural fluid, decreased since the prior CT. The right pleural effusion is resolved.  Hepatobiliary: No gross intraparenchymal abnormality within the liver, given limitation of unenhanced CT. Gallbladder sludge or small stones. No acute cholecystitis or biliary ductal dilatation.  Pancreas: Normal, without mass or pancreatic ductal dilatation.  Spleen: Normal  Adrenals/Urinary Tract: Normal adrenal glands. No renal calculi or hydronephrosis. Probable artifactual hyperattenuation in the right side of the bladder on image 73.  Stomach/Bowel: Collapsed stomach. Large amount of stool within the rectum. Extensive sigmoid diverticulosis. Normal caliber of the colon. No  evidence of small bowel obstruction. Enterotomy changes within the jejunum.  Vascular/Lymphatic: Aortic and branch vessel atherosclerosis. No definite retroperitoneal adenopathy. No pelvic sidewall adenopathy.  Reproductive: Normal prostate.  Other: Small volume perihepatic ascites is decreased. Decrease trace cul-de-sac fluid. Minimal increased density within suggest complexity.  Large volume omental/peritoneal infiltrative masses. An area of omental caking measures 3.7 cm on image 46 of series 2. On the order of 2.0 cm on image 48 the prior exam. This suggests progression. Extensive soft tissue masses along the serosa of small bowel loops.  Bilateral inguinal hernias. The right-sided hernia contains either complex fluid or peritoneal tumor. Left-sided hernia contains fat.  Musculoskeletal: No acute osseous abnormality. Degenerative disc disease at the lumbosacral junction.  IMPRESSION: 1. Massive omental/peritoneal disease which is likely related to metastatic disease from the patient's GI stromal tumor. This is progressive since 09/13/2014. These results will be called to the ordering clinician or representative by the Radiologist Assistant, and communication documented in the PACS or zVision Dashboard. 2. Decreased small volume abdominal pelvic fluid with mild complexity within. Favor malignant ascites. 3. Mild limitations secondary to lack of IV contrast and paucity of abdominal pelvic fat. 4. Decreased small left pleural effusion. 5. Cholelithiasis 6. Favor artifactual hyperattenuation in the right bladder base. 7. Bilateral inguinal hernias, chronic. The right-sided hernia contains either complex fluid or peritoneal disease.   Electronically Signed   By: Abigail Miyamoto M.D.   On: 10/16/2014 12:32   Delfina Redwood [facility] Pager [phone]  If 7PM-7AM, please contact night-coverage [email or web address] Password [email or web address] 10/16/2014, 5:42 PM

## 2014-10-16 NOTE — ED Notes (Signed)
Admitting PHysician at the bedside.

## 2014-10-16 NOTE — Telephone Encounter (Signed)
Spoke with pt's son. Appt given for 10/30 at 0930.

## 2014-10-16 NOTE — ED Notes (Signed)
Pt sent by dr Michail Sermon for abn ct stan, sts cancer has come back and is in stomach and was told not to waste time and get here. Denies pain.

## 2014-10-17 ENCOUNTER — Telehealth: Payer: Self-pay | Admitting: Oncology

## 2014-10-17 ENCOUNTER — Ambulatory Visit (HOSPITAL_BASED_OUTPATIENT_CLINIC_OR_DEPARTMENT_OTHER): Payer: Medicare Other

## 2014-10-17 ENCOUNTER — Ambulatory Visit (HOSPITAL_BASED_OUTPATIENT_CLINIC_OR_DEPARTMENT_OTHER): Payer: Medicare Other | Admitting: Oncology

## 2014-10-17 VITALS — BP 124/48 | HR 75 | Temp 98.3°F | Resp 18 | Ht 67.0 in | Wt 164.6 lb

## 2014-10-17 DIAGNOSIS — C179 Malignant neoplasm of small intestine, unspecified: Secondary | ICD-10-CM | POA: Diagnosis not present

## 2014-10-17 DIAGNOSIS — C49A3 Gastrointestinal stromal tumor of small intestine: Secondary | ICD-10-CM

## 2014-10-17 DIAGNOSIS — D649 Anemia, unspecified: Secondary | ICD-10-CM | POA: Diagnosis not present

## 2014-10-17 LAB — COMPREHENSIVE METABOLIC PANEL (CC13)
ALK PHOS: 201 U/L — AB (ref 40–150)
ALT: 28 U/L (ref 0–55)
AST: 21 U/L (ref 5–34)
Albumin: 2.6 g/dL — ABNORMAL LOW (ref 3.5–5.0)
Anion Gap: 6 mEq/L (ref 3–11)
BILIRUBIN TOTAL: 1.09 mg/dL (ref 0.20–1.20)
BUN: 31.6 mg/dL — ABNORMAL HIGH (ref 7.0–26.0)
CO2: 27 mEq/L (ref 22–29)
Calcium: 8.9 mg/dL (ref 8.4–10.4)
Chloride: 109 mEq/L (ref 98–109)
Creatinine: 1.1 mg/dL (ref 0.7–1.3)
Glucose: 103 mg/dl (ref 70–140)
Potassium: 4.3 mEq/L (ref 3.5–5.1)
SODIUM: 143 meq/L (ref 136–145)
TOTAL PROTEIN: 6 g/dL — AB (ref 6.4–8.3)

## 2014-10-17 LAB — CBC & DIFF AND RETIC
BASO%: 0.3 % (ref 0.0–2.0)
Basophils Absolute: 0 10*3/uL (ref 0.0–0.1)
EOS ABS: 0.2 10*3/uL (ref 0.0–0.5)
EOS%: 3.2 % (ref 0.0–7.0)
HEMATOCRIT: 25.2 % — AB (ref 38.4–49.9)
HEMOGLOBIN: 8.1 g/dL — AB (ref 13.0–17.1)
IMMATURE RETIC FRACT: 6.9 % (ref 3.00–10.60)
LYMPH%: 11.1 % — ABNORMAL LOW (ref 14.0–49.0)
MCH: 31.8 pg (ref 27.2–33.4)
MCHC: 32.1 g/dL (ref 32.0–36.0)
MCV: 98.8 fL — ABNORMAL HIGH (ref 79.3–98.0)
MONO#: 0.6 10*3/uL (ref 0.1–0.9)
MONO%: 10.3 % (ref 0.0–14.0)
NEUT#: 4.6 10*3/uL (ref 1.5–6.5)
NEUT%: 75.1 % — AB (ref 39.0–75.0)
PLATELETS: 229 10*3/uL (ref 140–400)
RBC: 2.55 10*6/uL — ABNORMAL LOW (ref 4.20–5.82)
RDW: 16.4 % — ABNORMAL HIGH (ref 11.0–14.6)
Retic %: 3.2 % — ABNORMAL HIGH (ref 0.80–1.80)
Retic Ct Abs: 81.6 10*3/uL (ref 34.80–93.90)
WBC: 6.2 10*3/uL (ref 4.0–10.3)
lymph#: 0.7 10*3/uL — ABNORMAL LOW (ref 0.9–3.3)
nRBC: 0 % (ref 0–0)

## 2014-10-17 LAB — CHCC SMEAR

## 2014-10-17 NOTE — Telephone Encounter (Signed)
Gave avs & cal for Nov.  °

## 2014-10-17 NOTE — Telephone Encounter (Signed)
Closed encounter °

## 2014-10-17 NOTE — Progress Notes (Signed)
[place] OFFICE PROGRESS NOTE   Diagnosis: Gastrointestinal stromal tumor, anemia  INTERVAL HISTORY:   [name] was admitted 09/12/2014 with acute dyspnea. He was hypoxic and required intubation. He was diagnosed with a NSTEMI and pulmonary edema. He was placed on anticoagulation therapy and was noted to have a drop in hemoglobin. A CT suggested hemoperitoneum. He was transfused with packed red blood cells. The anemia was felt to be related to spontaneous peritoneal hemorrhage followup heparin. He was discharged 09/20/2014 with a hemoglobin of 9.4.  He saw Dr. Michail Sermon as an outpatient and a repeat abdomen CT was ordered 10/16/2014. This revealed evidence of carcinomatosis. He was referred to the emergency room. He he was seen in consultation by Dr. Conley Canal and there was no indication for hospital admission.  Mr. [name] denies bleeding. No pain. He had anorexia several months ago, but this has improved. He reports fatigue. The abdomen does not feel distended.    Objective:  Vital signs in last 24 hours:  Blood pressure 124/48, pulse 75, temperature 98.3 F (36.8 C), temperature source Oral, resp. rate 18, height 5' 7"  (1.702 m), weight 164 lb 9.6 oz (74.662 kg).    HEENT: Neck without mass Lymphatics: No cervical, supraclavicular, axillary, or inguinal nodes Resp: Lungs clear bilaterally Cardio: Regular rate and rhythm GI: The abdomen is distended, ascites is apparent, mass lesion palpable in the right mid to lower abdomen Vascular: Pitting edema of the legs bilaterally extending to the thighs, pitting presacral edema     Lab Results:  Lab Results  Component Value Date   WBC 6.2 10/17/2014   HGB 8.1* 10/17/2014   HCT 25.2* 10/17/2014   MCV 98.8* 10/17/2014   PLT 229 10/17/2014   NEUTROABS 4.6 10/17/2014   reticulocyte count 3.2% (81.6)  Blood smear: The platelets appear normal in number with a few large platelets, a few band forms, no blasts. Marketed  variation in red cell size. Numerous a canthus sites. You ovalocytes and teardrop forms. The polychromasia does not appear increased.  Imaging:  Ct Abdomen Pelvis Wo Contrast  10/16/2014   CLINICAL DATA:  History intestinal tumor with resection of mesenteric mass 6/14. Recent blood transfusion. Abdominal pain and anemia. Peritoneal hemorrhage diagnosed 9/15.Abdominal pain, unspecified abdominal location R10.9 (ICD-10-CM). Resected mesenteric mass in June of 2014 demonstrating GI stromal tumor. 24% risk of malignant behavior per pathology.  EXAM: CT ABDOMEN AND PELVIS WITHOUT CONTRAST  TECHNIQUE: Multidetector CT imaging of the abdomen and pelvis was performed following the standard protocol without IV contrast.  COMPARISON:  Plain films 09/16/2014. CT 09/13/2014 and 04/26/2013. Surgical pathology report of June 2014.  FINDINGS: Lower chest: Mild left base interstitial thickening and scarring. Mild cardiomegaly with coronary artery atherosclerosis. Anemia, as evidenced by decreased density of the intravascular space. Trace left pleural fluid, decreased since the prior CT. The right pleural effusion is resolved.  Hepatobiliary: No gross intraparenchymal abnormality within the liver, given limitation of unenhanced CT. Gallbladder sludge or small stones. No acute cholecystitis or biliary ductal dilatation.  Pancreas: Normal, without mass or pancreatic ductal dilatation.  Spleen: Normal  Adrenals/Urinary Tract: Normal adrenal glands. No renal calculi or hydronephrosis. Probable artifactual hyperattenuation in the right side of the bladder on image 73.  Stomach/Bowel: Collapsed stomach. Large amount of stool within the rectum. Extensive sigmoid diverticulosis. Normal caliber of the colon. No evidence of small bowel obstruction. Enterotomy changes within the jejunum.  Vascular/Lymphatic: Aortic and branch vessel atherosclerosis. No definite retroperitoneal adenopathy. No pelvic sidewall adenopathy.  Reproductive:  Normal prostate.  Other: Small volume perihepatic ascites is decreased. Decrease trace cul-de-sac fluid. Minimal increased density within suggest complexity.  Large volume omental/peritoneal infiltrative masses. An area of omental caking measures 3.7 cm on image 46 of series 2. On the order of 2.0 cm on image 48 the prior exam. This suggests progression. Extensive soft tissue masses along the serosa of small bowel loops.  Bilateral inguinal hernias. The right-sided hernia contains either complex fluid or peritoneal tumor. Left-sided hernia contains fat.  Musculoskeletal: No acute osseous abnormality. Degenerative disc disease at the lumbosacral junction.  IMPRESSION: 1. Massive omental/peritoneal disease which is likely related to metastatic disease from the patient's GI stromal tumor. This is progressive since 09/13/2014. These results will be called to the ordering clinician or representative by the Radiologist Assistant, and communication documented in the PACS or zVision Dashboard. 2. Decreased small volume abdominal pelvic fluid with mild complexity within. Favor malignant ascites. 3. Mild limitations secondary to lack of IV contrast and paucity of abdominal pelvic fat. 4. Decreased small left pleural effusion. 5. Cholelithiasis 6. Favor artifactual hyperattenuation in the right bladder base. 7. Bilateral inguinal hernias, chronic. The right-sided hernia contains either complex fluid or peritoneal disease.   Electronically Signed   By: Abigail Miyamoto M.D.   On: 10/16/2014 12:32    Medications: I have reviewed the patient's current medications.  Assessment/Plan: 1. 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 Anemia-potentially related to GI bleeding or myelodysplasia 3. NSTEMI-March 2014, cardiac catheterization 03/05/2013  confirmed an LAD occlusion  4. Presyncope event 02/27/2013 5.  admission 09/12/2014 with respiratory failure secondary to pulmonary edema  Disposition:  Mr. Needle has a history of a gastrointestinal stromal tumor of the small bowel, status post primary section in June of 2014. He now has evidence of progressive disease in the abdominal cavity. I reviewed the CT images and discuss treatment options with Mr. Fitzsimmons and his family. He will be referred for a diagnostic biopsy. The plan is to begin [place] therapy if a diagnosis of recurrent gastrointestinal stromal tumor is confirmed.  He has severe anemia. He had macrocytic anemia on hospital admission 09/12/2014. The hemoglobin fell while he was in the hospital, and he was diagnosed with "spontaneous peritoneal hemorrhage ", but it is unclear whether the anemia was related hemorrhage or another etiology. He has persistent anemia with abnormal red cells on the peripheral blood smear. We will refer him for a diagnostic bone marrow biopsy to look for evidence of myelodysplasia. I have a low clinical suspicion for gastrointestinal stromal tumor involving the bone marrow.   He will return for an office visit and further discussion after the bone marrow biopsy and abdominal soft tissue biopsy. He will contact us in the interim for new symptoms.   Betsy Coder, MD  10/17/2014  2:14 PM

## 2014-10-20 ENCOUNTER — Telehealth: Payer: Self-pay | Admitting: *Deleted

## 2014-10-20 ENCOUNTER — Other Ambulatory Visit: Payer: Self-pay | Admitting: Radiology

## 2014-10-20 NOTE — Telephone Encounter (Signed)
Called to confirm which biopsy patient needs-one is on 11/3 at [facility] and another is 11/6 at [facility].

## 2014-10-21 ENCOUNTER — Ambulatory Visit (HOSPITAL_COMMUNITY): Admission: RE | Admit: 2014-10-21 | Payer: Medicare Other | Source: Ambulatory Visit

## 2014-10-21 ENCOUNTER — Other Ambulatory Visit: Payer: Self-pay | Admitting: Oncology

## 2014-10-21 ENCOUNTER — Ambulatory Visit (HOSPITAL_COMMUNITY)
Admission: RE | Admit: 2014-10-21 | Discharge: 2014-10-21 | Disposition: A | Discharge status: Home / Self Care | Payer: Medicare Other | Source: Ambulatory Visit | Attending: Oncology | Admitting: Oncology

## 2014-10-21 ENCOUNTER — Encounter (HOSPITAL_COMMUNITY): Payer: Self-pay

## 2014-10-21 DIAGNOSIS — I5033 Acute on chronic diastolic (congestive) heart failure: Secondary | ICD-10-CM | POA: Diagnosis not present

## 2014-10-21 DIAGNOSIS — C762 Malignant neoplasm of abdomen: Secondary | ICD-10-CM | POA: Diagnosis not present

## 2014-10-21 DIAGNOSIS — D704 Cyclic neutropenia: Secondary | ICD-10-CM | POA: Diagnosis not present

## 2014-10-21 DIAGNOSIS — C49A3 Gastrointestinal stromal tumor of small intestine: Secondary | ICD-10-CM

## 2014-10-21 DIAGNOSIS — D649 Anemia, unspecified: Secondary | ICD-10-CM | POA: Diagnosis not present

## 2014-10-21 DIAGNOSIS — C179 Malignant neoplasm of small intestine, unspecified: Secondary | ICD-10-CM | POA: Insufficient documentation

## 2014-10-21 DIAGNOSIS — D481 Neoplasm of uncertain behavior of connective and other soft tissue: Secondary | ICD-10-CM | POA: Diagnosis not present

## 2014-10-21 DIAGNOSIS — Z8509 Personal history of malignant neoplasm of other digestive organs: Secondary | ICD-10-CM | POA: Diagnosis not present

## 2014-10-21 DIAGNOSIS — R1909 Other intra-abdominal and pelvic swelling, mass and lump: Secondary | ICD-10-CM | POA: Diagnosis not present

## 2014-10-21 DIAGNOSIS — Z8719 Personal history of other diseases of the digestive system: Secondary | ICD-10-CM | POA: Diagnosis not present

## 2014-10-21 LAB — CBC
HCT: 29.7 % — ABNORMAL LOW (ref 39.0–52.0)
Hemoglobin: 9.6 g/dL — ABNORMAL LOW (ref 13.0–17.0)
MCH: 32.3 pg (ref 26.0–34.0)
MCHC: 32.3 g/dL (ref 30.0–36.0)
MCV: 100 fL (ref 78.0–100.0)
PLATELETS: 313 10*3/uL (ref 150–400)
RBC: 2.97 MIL/uL — AB (ref 4.22–5.81)
RDW: 17.1 % — ABNORMAL HIGH (ref 11.5–15.5)
WBC: 6.5 10*3/uL (ref 4.0–10.5)

## 2014-10-21 LAB — PROTIME-INR
INR: 1.07 (ref 0.00–1.49)
PROTHROMBIN TIME: 14.1 s (ref 11.6–15.2)

## 2014-10-21 LAB — BONE MARROW EXAM

## 2014-10-21 LAB — APTT: APTT: 29 s (ref 24–37)

## 2014-10-21 MED ORDER — GELATIN ABSORBABLE 12-7 MM EX MISC
CUTANEOUS | Status: AC
  Filled 2014-10-21: qty 1

## 2014-10-21 MED ORDER — SODIUM CHLORIDE 0.9 % IV SOLN
Freq: Once | INTRAVENOUS | Status: AC
  Administered 2014-10-21: 09:00:00 via INTRAVENOUS

## 2014-10-21 MED ORDER — FENTANYL CITRATE 0.05 MG/ML IJ SOLN
INTRAMUSCULAR | Status: AC | PRN
  Administered 2014-10-21 (×3): 25 ug via INTRAVENOUS

## 2014-10-21 MED ORDER — MIDAZOLAM HCL 2 MG/2ML IJ SOLN
INTRAMUSCULAR | Status: AC
Start: 2014-10-21 — End: 2014-10-21
  Filled 2014-10-21: qty 2

## 2014-10-21 MED ORDER — LIDOCAINE-EPINEPHRINE 1 %-1:100000 IJ SOLN
INTRAMUSCULAR | Status: AC
  Filled 2014-10-21: qty 1

## 2014-10-21 MED ORDER — FENTANYL CITRATE 0.05 MG/ML IJ SOLN
INTRAMUSCULAR | Status: AC
  Filled 2014-10-21: qty 2

## 2014-10-21 MED ORDER — MIDAZOLAM HCL 2 MG/2ML IJ SOLN
INTRAMUSCULAR | Status: AC | PRN
  Administered 2014-10-21 (×3): 0.5 mg via INTRAVENOUS

## 2014-10-21 MED ORDER — LIDOCAINE HCL (PF) 1 % IJ SOLN
INTRAMUSCULAR | Status: AC
Start: 2014-10-21 — End: 2014-10-21
  Filled 2014-10-21: qty 30

## 2014-10-21 NOTE — Sedation Documentation (Signed)
Patient denies pain and is resting comfortably.  

## 2014-10-21 NOTE — Sedation Documentation (Signed)
Finished with bone marrow biopsy.  Pt repositioned for mass biopsy.  Band aid to lower middle back, C/D/I at this time.

## 2014-10-21 NOTE — H&P (Signed)
Chief Complaint: Hx gastro intestinal stromal tumor of small bowel Resection 05/2013 New Ct 09/2014 reveals carcinomatosis  Referring Physician(s): Sherrill,Gary B  History of Present Illness: [name] [name] is a 80 y.o. male  Pt with Hx GIST of small bowel Resection 05/2013 Did well for over 1 yr Was hospitalized 08/2014 with SOB: NSTEMI and placed on anticoagulation Developed drop in Hgb CT revealed hemoperitoneum Anticoagulation stopped Referred to Dr Michail Sermon for GI follow up New CT 10/16/2014 shows carcinomatosis Dr [name] has requested biopsy of omental/peritonal mass   Past Medical History  Diagnosis Date  . Hyperlipidemia     TAKES zOCOR DAILY  . Abdominal mass 02/2013  . UTI (urinary tract infection)   . Hypertension     TAKES LOTREL,HCTZ,AND METOPROLOL DAILY  . NSTEMI (non-ST elevated myocardial infarction) 02/2013    "LIGHT: ONE MD SAID HE DID AND ONE SAID HE DIDN'T  . Pneumonia     MAR 2014  . Stromal tumor of digestive system   . CAD (coronary artery disease)   . Shortness of breath   . Acute respiratory failure with hypoxia 09/12/2014    Past Surgical History  Procedure Laterality Date  . Cyst excision  1962    wrist  . Cardiac catheterization  03-05-13  . Laparotomy N/A 06/04/2013    Procedure: EXPLORATORY LAPAROTOMY, OPEN RESECTION OF MESENTERIC AND INTESTINAL MASS;  Surgeon: [name], MD;  Location: [facility];  Service: General;  Laterality: N/A;  Small Bowel Resection  . Colonoscopy  09/08/2014    dr scooler    Allergies: Review of patient's allergies indicates no known allergies.  Medications: Prior to Admission medications   Medication Sig Start Date End Date Taking? Authorizing Provider  benazepril (LOTENSIN) 20 MG tablet Take 20 mg by mouth daily. 04/08/14  Yes Historical Provider, MD  Cyanocobalamin (B-12 PO) Take 1 tablet by mouth daily.    Yes Historical Provider, MD  hydrochlorothiazide (HYDRODIURIL) 25 MG tablet Take 25 mg by mouth  daily.   Yes Historical Provider, MD  IRON PO Take 1 tablet by mouth daily.    Yes Historical Provider, MD  metoprolol tartrate (LOPRESSOR) 25 MG tablet Take 1 tablet (25 mg total) by mouth 2 (two) times daily. 03/06/13  Yes Costin Karlyne Greenspan, MD  simvastatin (ZOCOR) 20 MG tablet Take 20 mg by mouth every evening.   Yes Historical Provider, MD    History reviewed. No pertinent family history.  History   Social History  . Marital Status: Married    Spouse Name: [name] [name]    Number of Children: 2  . Years of Education: N/A   Occupational History  . Retired     Writer   Social History Main Topics  . Smoking status: Never Smoker   . Smokeless tobacco: Never Used  . Alcohol Use: No  . Drug Use: No  . Sexual Activity: No   Other Topics Concern  . None   Social History Narrative   Married, wife [name] [name]   Retired Environmental manager for 2 years   Independent in [place]   #2 grown sons     Review of Systems: A 12 point ROS discussed and pertinent positives are indicated in the HPI above.  All other systems are negative.  Review of Systems  Constitutional: Positive for fatigue. Negative for activity change, appetite change and unexpected weight change.  Respiratory: Negative for apnea, cough, chest tightness and shortness of breath.   Cardiovascular: Negative for chest  pain.  Gastrointestinal: Positive for abdominal distention. Negative for abdominal pain.  Genitourinary: Negative for difficulty urinating.  Musculoskeletal: Negative for back pain.  Psychiatric/Behavioral: Negative for behavioral problems and confusion.    Vital Signs: BP 132/59 mmHg  Pulse 72  Temp(Src) 98.3 F (36.8 C) (Oral)  Resp 18  Ht 5\' 7"  (1.702 m)  Wt 74.39 kg (164 lb)  BMI 25.68 kg/m2  SpO2 98%  Physical Exam  Constitutional: He is oriented to person, place, and time. He appears well-nourished.  Cardiovascular: Normal rate, regular rhythm and normal heart sounds.   No murmur  heard. Pulmonary/Chest: Breath sounds normal. He has no wheezes.  Abdominal: Soft. Bowel sounds are normal. He exhibits distension. There is no tenderness.  Musculoskeletal: Normal range of motion.  Neurological: He is alert and oriented to person, place, and time.  Skin: Skin is warm and dry.  Psychiatric: He has a normal mood and affect. His behavior is normal. Judgment and thought content normal.  Nursing note and vitals reviewed.   Imaging: Ct Abdomen Pelvis Wo Contrast  10/16/2014   CLINICAL DATA:  History intestinal tumor with resection of mesenteric mass 6/14. Recent blood transfusion. Abdominal pain and anemia. Peritoneal hemorrhage diagnosed 9/15.Abdominal pain, unspecified abdominal location R10.9 (ICD-10-CM). Resected mesenteric mass in June of 2014 demonstrating GI stromal tumor. 24% risk of malignant behavior per pathology.  EXAM: CT ABDOMEN AND PELVIS WITHOUT CONTRAST  TECHNIQUE: Multidetector CT imaging of the abdomen and pelvis was performed following the standard protocol without IV contrast.  COMPARISON:  Plain films 09/16/2014. CT 09/13/2014 and 04/26/2013. Surgical pathology report of June 2014.  FINDINGS: Lower chest: Mild left base interstitial thickening and scarring. Mild cardiomegaly with coronary artery atherosclerosis. Anemia, as evidenced by decreased density of the intravascular space. Trace left pleural fluid, decreased since the prior CT. The right pleural effusion is resolved.  Hepatobiliary: No gross intraparenchymal abnormality within the liver, given limitation of unenhanced CT. Gallbladder sludge or small stones. No acute cholecystitis or biliary ductal dilatation.  Pancreas: Normal, without mass or pancreatic ductal dilatation.  Spleen: Normal  Adrenals/Urinary Tract: Normal adrenal glands. No renal calculi or hydronephrosis. Probable artifactual hyperattenuation in the right side of the bladder on image 73.  Stomach/Bowel: Collapsed stomach. Large amount of stool  within the rectum. Extensive sigmoid diverticulosis. Normal caliber of the colon. No evidence of small bowel obstruction. Enterotomy changes within the jejunum.  Vascular/Lymphatic: Aortic and branch vessel atherosclerosis. No definite retroperitoneal adenopathy. No pelvic sidewall adenopathy.  Reproductive: Normal prostate.  Other: Small volume perihepatic ascites is decreased. Decrease trace cul-de-sac fluid. Minimal increased density within suggest complexity.  Large volume omental/peritoneal infiltrative masses. An area of omental caking measures 3.7 cm on image 46 of series 2. On the order of 2.0 cm on image 48 the prior exam. This suggests progression. Extensive soft tissue masses along the serosa of small bowel loops.  Bilateral inguinal hernias. The right-sided hernia contains either complex fluid or peritoneal tumor. Left-sided hernia contains fat.  Musculoskeletal: No acute osseous abnormality. Degenerative disc disease at the lumbosacral junction.  IMPRESSION: 1. Massive omental/peritoneal disease which is likely related to metastatic disease from the patient's GI stromal tumor. This is progressive since 09/13/2014. These results will be called to the ordering clinician or representative by the Radiologist Assistant, and communication documented in the PACS or zVision Dashboard. 2. Decreased small volume abdominal pelvic fluid with mild complexity within. Favor malignant ascites. 3. Mild limitations secondary to lack of IV contrast and paucity of abdominal  pelvic fat. 4. Decreased small left pleural effusion. 5. Cholelithiasis 6. Favor artifactual hyperattenuation in the right bladder base. 7. Bilateral inguinal hernias, chronic. The right-sided hernia contains either complex fluid or peritoneal disease.   Electronically Signed   By: Abigail Miyamoto M.D.   On: 10/16/2014 12:32    Labs:  CBC:  Recent Labs  09/20/14 0337 10/16/14 1325 10/17/14 1118 10/21/14 0820  WBC 6.8 6.4 6.2 6.5  HGB 9.4*  8.9* 8.1* 9.6*  HCT 29.9* 27.9* 25.2* 29.7*  PLT 331 252 229 313    COAGS:  Recent Labs  09/12/14 0445 09/13/14 1115 10/16/14 1626 10/21/14 0820  INR 1.11 1.18 1.09 1.07  APTT  --   --   --  29    BMP:  Recent Labs  09/18/14 0610 09/19/14 0315 09/20/14 0337 10/16/14 1325 10/17/14 1118  NA 145 147 145 144 143  K 3.5* 3.8 3.7 4.2 4.3  CL 103 106 105 105  --   CO2 30 30 30 27 27   GLUCOSE 96 91 91 87 103  BUN 48* 39* 35* 30* 31.6*  CALCIUM 8.3* 8.0* 7.9* 9.2 8.9  CREATININE 1.53* 1.27 1.23 1.03 1.1  GFRNONAA 40* 50* 52* 65*  --   GFRAA 47* 59* 61* 75*  --     LIVER FUNCTION TESTS:  Recent Labs  09/14/14 0317 09/15/14 0629 10/16/14 1325 10/17/14 1118  BILITOT 1.4* 3.1* 1.2 1.09  AST 15 13 21 21   ALT 12 11 28 28   ALKPHOS 73 70 234* 201*  PROT 5.2* 5.1* 6.7 6.0*  ALBUMIN 2.4* 2.2* 3.0* 2.6*    TUMOR MARKERS: No results for input(s): AFPTM, CEA, CA199, CHROMGRNA in the last 8760 hours.  Assessment and Plan:  Hx GIST small bowel Resection 05/2013 09/2014 CT reveals carcinomatosis Now scheduled for omental/peritoneal mass biopsy Pt aware of procedure benefits and risks and agreeable to proceed Consent signed and in chart  Thank you for this interesting consult.  I greatly enjoyed meeting [name] [name] and look forward to participating in their care.    I spent a total of 20 minutes face to face in clinical consultation, greater than 50% of which was counseling/coordinating care for omental/peritoneal mass bx  Signed: Kyra Laffey A 10/21/2014, 10:13 AM

## 2014-10-21 NOTE — Procedures (Signed)
Technically successful CT guided bone marrow aspiration and biopsy of left iliac crest. Technically successful CT guided biopsy of omental mass within the left lower abdominal quadrant. No immediate complications.

## 2014-10-21 NOTE — Discharge Instructions (Signed)
Bone Marrow Aspiration and Bone Biopsy Examination of the bone marrow is a valuable test to diagnose blood disorders. A bone marrow biopsy takes a sample of bone and a small amount of fluid and cells from inside the bone. A bone marrow aspiration removes only the marrow. Bone marrow aspiration and bone biopsies are used to stage different disorders of the blood, such as leukemia. Staging will help your caregiver understand how far the disease has progressed.  The tests are also useful in diagnosing:  Fever of unknown origin (FUO).  Bacterial infections and other widespread fungal infections.  Cancers that have spread (metastasized) to the bone marrow.  Diseases that are characterized by a deficiency of an enzyme (storage diseases). This includes:  Niemann-Pick disease.  Gaucher disease. PROCEDURE  Sites used to get samples include:   Back of your hip bone (posterior iliac crest).  Both aspiration and biopsy.  Front of your hip bone (anterior iliac crest).  Both aspiration and biopsy.  Breastbone (sternum).  Aspiration from your breastbone (done only in adults). This method is rarely used. When you get a hip bone aspiration:  You are placed lying on your side with the upper knee brought up and flexed with the lower leg straight.  The site is prepared, cleaned with an antiseptic scrub, and draped. This keeps the biopsy area clean.  The skin and the area down to the lining of the bone (periosteum) are made numb with a local anesthetic.  The bone marrow aspiration needle is inserted. You will feel pressure on your bone.  Once inside the marrow cavity, a sample of bone marrow is sucked out (aspirated) for pathology slides.  The material collected for bone marrow slides is processed immediately by a technologist.  The technician selects the marrow particles to make the slides for pathology.  The marrow aspiration needle is removed. Then pressure is applied to the site with  gauze until bleeding has stopped. Following an aspiration, a bone marrow biopsy may be performed as well. The technique for this is very similar. A dressing is then applied.  RISKS AND COMPLICATIONS  The main complications of a bone marrow aspiration and biopsy include infection and bleeding.  Complications are uncommon. The procedure may not be performed in patients with bleeding tendencies.  A very rare complication from the procedure is injury to the heart during a breastbone (sternal) marrow aspiration. Only bone marrow aspirations are performed in this area.  Long-lasting pain at the site of the bone marrow aspiration and biopsy is uncommon. Your caregiver will let you know when you are to get your results and will discuss them with you. You may make an appointment with your caregiver to find out the results. Do not assume everything is normal if you have not heard from your caregiver or the medical facility. It is important for you to follow up on all of your test results. Document Released: 12/08/2004 Document Revised: 02/27/2012 Document Reviewed: 12/02/2008 [facility] Patient Information 2015 [place], [place]. This information is not intended to replace advice given to you by your health care provider. Make sure you discuss any questions you have with your health care provider.  Needle Biopsy Care After These instructions give you information on caring for yourself after your procedure. Your doctor may also give you more specific instructions. Call your doctor if you have any problems or questions after your procedure. HOME CARE  Rest for 4 hours after your biopsy, except for getting up to go to the bathroom  or as told.  Keep the places where the needles were put in clean and dry.  Do not put powder or lotion on the sites.  Do not shower until 24 hours after the test. Remove all bandages (dressings) before showering.  Remove all bandages at least once every day. Gently clean the  sites with soap and water. Keep putting a new bandage on until the skin is closed. Finding out the results of your test Ask your doctor when your test results will be ready. Make sure you follow up and get the test results. GET HELP RIGHT AWAY IF:   You have shortness of breath or trouble breathing.  You have pain or cramping in your belly (abdomen).  You feel sick to your stomach (nauseous) or throw up (vomit).  Any of the places where the needles were put in:  Are puffy (swollen) or red.  Are sore or hot to the touch.  Are draining yellowish-white fluid (pus).  Are bleeding after 10 minutes of pressing down on the site. Have someone keep pressing on any place that is bleeding until you see a doctor.  You have any unusual pain that will not stop.  You have a fever. If you go to the emergency room, tell the nurse that you had a biopsy. Take this paper with you to show the nurse. MAKE SURE YOU:   Understand these instructions.  Will watch your condition.  Will get help right away if you are not doing well or get worse. Document Released: 11/17/2008 Document Revised: 02/27/2012 Document Reviewed: 11/17/2008 [facility] Patient Information 2015 [place], [place]. This information is not intended to replace advice given to you by your health care provider. Make sure you discuss any questions you have with your health care provider.

## 2014-10-21 NOTE — Sedation Documentation (Signed)
Pt to nursing obs.

## 2014-10-21 NOTE — Sedation Documentation (Addendum)
Patient denies pain and is resting comfortably. Band aid to left lower abdominal area, C/D/I at this time.

## 2014-10-24 ENCOUNTER — Ambulatory Visit (HOSPITAL_COMMUNITY): Admission: RE | Admit: 2014-10-24 | Payer: Medicare Other | Source: Ambulatory Visit

## 2014-10-24 ENCOUNTER — Other Ambulatory Visit (HOSPITAL_COMMUNITY): Payer: Medicare Other

## 2014-10-27 ENCOUNTER — Telehealth: Payer: Self-pay | Admitting: Oncology

## 2014-10-27 ENCOUNTER — Ambulatory Visit (HOSPITAL_BASED_OUTPATIENT_CLINIC_OR_DEPARTMENT_OTHER): Payer: Medicare Other | Admitting: Oncology

## 2014-10-27 VITALS — BP 134/59 | HR 77 | Temp 98.4°F | Resp 18 | Ht 67.0 in | Wt 171.6 lb

## 2014-10-27 DIAGNOSIS — C786 Secondary malignant neoplasm of retroperitoneum and peritoneum: Secondary | ICD-10-CM | POA: Diagnosis not present

## 2014-10-27 DIAGNOSIS — D649 Anemia, unspecified: Secondary | ICD-10-CM

## 2014-10-27 DIAGNOSIS — C494 Malignant neoplasm of connective and soft tissue of abdomen: Secondary | ICD-10-CM | POA: Diagnosis not present

## 2014-10-27 DIAGNOSIS — C49A3 Gastrointestinal stromal tumor of small intestine: Secondary | ICD-10-CM

## 2014-10-27 DIAGNOSIS — R609 Edema, unspecified: Secondary | ICD-10-CM | POA: Diagnosis not present

## 2014-10-27 LAB — CHROMOSOME ANALYSIS, BONE MARROW

## 2014-10-27 NOTE — Progress Notes (Signed)
  [place] OFFICE PROGRESS NOTE   Diagnosis: gastrointestinal stromal tumor  INTERVAL HISTORY:   [name] [name] returns as scheduled. He underwent a bone marrow biopsy and omental biopsy on 10/21/2014. He reports tolerating the procedure well. The omental biopsy confirmed a gastrointestinal stromal tumor.the bone marrow biopsy revealed a hypercellular marrow without significant dyspoiesis. Storage iron was abundant. Cytogenetics are pending.  He reports persistent leg edema. He has developed scrotal/penile edema that improves during the night.He complains of insomnia.  Objective:  Vital signs in last 24 hours:  Blood pressure 134/59, pulse 77, temperature 98.4 F (36.9 C), temperature source Oral, resp. rate 18, height _0  (1.702 m), weight 171 lb 9.6 oz (77.837 kg).  Resp: decreased breath sounds at the bases, no respiratory distress Cardio: regular rate and rhythm GI: mildly distended, no hepatosplenomegaly, no discrete mass.ventral and right inguinal hernias Vascular: pitting edema to the thigh bilaterally GU: Mild penile and scrotal edema   Skin: left Iliac bone marrow biopsy site without evidence of infection    Lab Results:  Lab Results  Component Value Date   WBC 6.5 10/21/2014   HGB 9.6* 10/21/2014   HCT 29.7* 10/21/2014   MCV 100.0 10/21/2014   PLT 313 10/21/2014   NEUTROABS 4.6 10/17/2014    Medications: I have reviewed the patient's current medications.  Assessment/Plan: 1. 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 Anemia-potentially related to GI bleeding or myelodysplasia  Bone marrow biopsy 10/21/2014 revealed a hypercellular marrow with abundant iron stores, cytogenetics  pending 3. NSTEMI-March 2014, cardiac catheterization 03/05/2013 confirmed an LAD occlusion  4. Presyncope event 02/27/2013 5. admission 09/12/2014 with respiratory failure secondary to pulmonary edema 6.  edema-likely secondary to abdominal tumor and hypoalbuminemia  Disposition:  [name] [name] has been diagnosed with progressive gastrointestinal stromal tumor with carcinomatosis. I discussed treatment options with [name] [name] and his family. No therapy will be curative. I suspect the edema is related to the carcinomatosis. The anemia may be secondary to "chronic disease ", chronic GI bleeding, or myelodysplasia. Cytogenetics from the bone marrow biopsy are pending.  I recommend Gleevec therapy. We reviewed the potential toxicities associated with Gleevec including the chance for a rash, hematologic toxicity, edema, nausea/diarrhea, and cardiac toxicity. He agrees to proceed. He will start at a dose of 300 mg daily with the plan to escalate to 400 mg daily if he is tolerating the [place] after 2 weeks.  [name] [name] will return for an office visit 11/12/2014.  Approximate 40 minutes were spent with the patient today. The majority of the time was used for counseling and coordination of care.  Betsy Coder, MD  10/27/2014  4:31 PM

## 2014-10-27 NOTE — Telephone Encounter (Signed)
Pt confirmed labs/ov per 11/09 POF, gave pt AVS ..... KJ °

## 2014-10-28 ENCOUNTER — Other Ambulatory Visit: Payer: Self-pay | Admitting: *Deleted

## 2014-10-28 ENCOUNTER — Ambulatory Visit: Payer: Medicare Other | Admitting: Oncology

## 2014-10-28 DIAGNOSIS — C49A3 Gastrointestinal stromal tumor of small intestine: Secondary | ICD-10-CM

## 2014-10-28 MED ORDER — IMATINIB MESYLATE 100 MG PO TABS: ORAL_TABLET | ORAL | Status: DC

## 2014-10-29 ENCOUNTER — Encounter: Payer: Self-pay | Admitting: Oncology

## 2014-10-29 ENCOUNTER — Telehealth: Payer: Self-pay | Admitting: *Deleted

## 2014-10-29 NOTE — Progress Notes (Signed)
Faxed gleevec pa form to [facility]

## 2014-10-29 NOTE — Telephone Encounter (Signed)
Spoke with pt's son Merry Proud. Per Dr. 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 Follow up as scheduled. Pt to start [place] as discussed. Merry Proud voiced understanding.

## 2014-10-30 ENCOUNTER — Encounter: Payer: Self-pay | Admitting: Oncology

## 2014-10-30 DIAGNOSIS — I10 Essential (primary) hypertension: Secondary | ICD-10-CM | POA: Diagnosis not present

## 2014-10-30 DIAGNOSIS — I214 Non-ST elevation (NSTEMI) myocardial infarction: Secondary | ICD-10-CM | POA: Diagnosis not present

## 2014-10-30 DIAGNOSIS — I5033 Acute on chronic diastolic (congestive) heart failure: Secondary | ICD-10-CM | POA: Diagnosis not present

## 2014-10-30 DIAGNOSIS — K661 Hemoperitoneum: Secondary | ICD-10-CM | POA: Diagnosis not present

## 2014-10-30 NOTE — Progress Notes (Signed)
[employer] [id number] approved gleevec from 10/29/2014-10/29/2016

## 2014-10-31 ENCOUNTER — Telehealth: Payer: Self-pay | Admitting: *Deleted

## 2014-10-31 NOTE — Telephone Encounter (Signed)
Confirmed with [facility] pt was eligible for a co-pay assistance program. They have picked up [place].

## 2014-11-12 ENCOUNTER — Other Ambulatory Visit (HOSPITAL_BASED_OUTPATIENT_CLINIC_OR_DEPARTMENT_OTHER): Payer: Medicare Other

## 2014-11-12 ENCOUNTER — Telehealth: Payer: Self-pay | Admitting: Oncology

## 2014-11-12 ENCOUNTER — Ambulatory Visit (HOSPITAL_BASED_OUTPATIENT_CLINIC_OR_DEPARTMENT_OTHER): Payer: Medicare Other | Admitting: Oncology

## 2014-11-12 VITALS — BP 155/65 | HR 69 | Temp 98.5°F | Resp 18 | Ht 67.0 in | Wt 165.5 lb

## 2014-11-12 DIAGNOSIS — D649 Anemia, unspecified: Secondary | ICD-10-CM | POA: Diagnosis not present

## 2014-11-12 DIAGNOSIS — C494 Malignant neoplasm of connective and soft tissue of abdomen: Secondary | ICD-10-CM

## 2014-11-12 DIAGNOSIS — C49A3 Gastrointestinal stromal tumor of small intestine: Secondary | ICD-10-CM

## 2014-11-12 LAB — COMPREHENSIVE METABOLIC PANEL (CC13)
ALBUMIN: 2.8 g/dL — AB (ref 3.5–5.0)
ALT: 28 U/L (ref 0–55)
ANION GAP: 7 meq/L (ref 3–11)
AST: 27 U/L (ref 5–34)
Alkaline Phosphatase: 178 U/L — ABNORMAL HIGH (ref 40–150)
BUN: 13.7 mg/dL (ref 7.0–26.0)
CALCIUM: 8.5 mg/dL (ref 8.4–10.4)
CHLORIDE: 108 meq/L (ref 98–109)
CO2: 29 meq/L (ref 22–29)
Creatinine: 1 mg/dL (ref 0.7–1.3)
Glucose: 87 mg/dl (ref 70–140)
Potassium: 3.9 mEq/L (ref 3.5–5.1)
SODIUM: 144 meq/L (ref 136–145)
TOTAL PROTEIN: 6 g/dL — AB (ref 6.4–8.3)
Total Bilirubin: 0.83 mg/dL (ref 0.20–1.20)

## 2014-11-12 LAB — CBC WITH DIFFERENTIAL/PLATELET
BASO%: 0.7 % (ref 0.0–2.0)
Basophils Absolute: 0.1 10*3/uL (ref 0.0–0.1)
EOS ABS: 1.4 10*3/uL — AB (ref 0.0–0.5)
EOS%: 20.5 % — ABNORMAL HIGH (ref 0.0–7.0)
HEMATOCRIT: 29.5 % — AB (ref 38.4–49.9)
HGB: 9.7 g/dL — ABNORMAL LOW (ref 13.0–17.1)
LYMPH%: 14.6 % (ref 14.0–49.0)
MCH: 33.1 pg (ref 27.2–33.4)
MCHC: 32.8 g/dL (ref 32.0–36.0)
MCV: 101 fL — ABNORMAL HIGH (ref 79.3–98.0)
MONO#: 0.4 10*3/uL (ref 0.1–0.9)
MONO%: 5.8 % (ref 0.0–14.0)
NEUT%: 58.4 % (ref 39.0–75.0)
NEUTROS ABS: 3.9 10*3/uL (ref 1.5–6.5)
Platelets: 365 10*3/uL (ref 140–400)
RBC: 2.92 10*6/uL — AB (ref 4.20–5.82)
RDW: 16.9 % — ABNORMAL HIGH (ref 11.0–14.6)
WBC: 6.7 10*3/uL (ref 4.0–10.3)
lymph#: 1 10*3/uL (ref 0.9–3.3)

## 2014-11-12 NOTE — Progress Notes (Signed)
  [place] OFFICE PROGRESS NOTE   Diagnosis: Gastrointestinal stromal tumor  INTERVAL HISTORY:   [name] [name] returns as scheduled. He began [place] at a dose of 300 mg daily on 10/31/2014. He reports tolerating the [place] well. No nausea, diarrhea, or rash. The abdomen and leg edema have improved. He has persistent scrotal edema. He reports an improved energy level.  Objective:  Vital signs in last 24 hours:  Blood pressure 155/65, pulse 69, temperature 98.5 F (36.9 C), temperature source Oral, resp. rate 18, height _0  (1.702 m), weight 165 lb 8 oz (75.07 kg), SpO2 100 %.    HEENT: No thrush or ulcers Resp: Decreased breath sounds with end inspiratory rhonchi at the left posterior base, no respiratory distress Cardio: Regular rate and rhythm GI: Mildly distended, no mass, no hepatomegaly Vascular: 1+ pitting edema below the knee bilaterally  Skin: No rash  GU: Scrotal and penile edema   Lab Results:  Lab Results  Component Value Date   WBC 6.7 11/12/2014   HGB 9.7* 11/12/2014   HCT 29.5* 11/12/2014   MCV 101.0* 11/12/2014   PLT 365 11/12/2014   NEUTROABS 3.9 11/12/2014     Medications: I have reviewed the patient's current medications.  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 Anemia-potentially related to GI bleeding or myelodysplasia  Bone marrow biopsy 10/21/2014 revealed a hypercellular marrow with abundant iron stores, cytogenetics pending 3. NSTEMI-March 2014, cardiac catheterization 03/05/2013 confirmed an LAD occlusion  4. Presyncope event  02/27/2013 5. admission 09/12/2014 with respiratory failure secondary to pulmonary edema 6. edema-likely secondary to abdominal tumor and hypoalbuminemia, improved   Disposition:  [name] [name] appears to be tolerating the [place] well. The peripheral edema and abdominal distention are partially improved. He will increase the Gleevec dose to 400 mg daily on 11/14/2014. He will return for an office and lab visit in 2 weeks. I recommended he attempt to elevate scrotum to see if this will help the edema there.  Betsy Coder, MD  11/12/2014  11:39 AM

## 2014-11-12 NOTE — Telephone Encounter (Signed)
Gave avs & cal for Dec. °

## 2014-11-18 DIAGNOSIS — I214 Non-ST elevation (NSTEMI) myocardial infarction: Secondary | ICD-10-CM | POA: Diagnosis not present

## 2014-11-18 DIAGNOSIS — K661 Hemoperitoneum: Secondary | ICD-10-CM | POA: Diagnosis not present

## 2014-11-18 DIAGNOSIS — I5033 Acute on chronic diastolic (congestive) heart failure: Secondary | ICD-10-CM | POA: Diagnosis not present

## 2014-11-18 DIAGNOSIS — I10 Essential (primary) hypertension: Secondary | ICD-10-CM | POA: Diagnosis not present

## 2014-11-26 ENCOUNTER — Telehealth: Payer: Self-pay | Admitting: Nurse Practitioner

## 2014-11-26 ENCOUNTER — Ambulatory Visit (HOSPITAL_BASED_OUTPATIENT_CLINIC_OR_DEPARTMENT_OTHER): Payer: Medicare Other | Admitting: Nurse Practitioner

## 2014-11-26 ENCOUNTER — Other Ambulatory Visit (HOSPITAL_BASED_OUTPATIENT_CLINIC_OR_DEPARTMENT_OTHER): Payer: Medicare Other

## 2014-11-26 VITALS — BP 165/65 | HR 68 | Temp 98.2°F | Resp 18 | Ht 67.0 in | Wt 163.0 lb

## 2014-11-26 DIAGNOSIS — R609 Edema, unspecified: Secondary | ICD-10-CM

## 2014-11-26 DIAGNOSIS — D649 Anemia, unspecified: Secondary | ICD-10-CM | POA: Diagnosis not present

## 2014-11-26 DIAGNOSIS — C494 Malignant neoplasm of connective and soft tissue of abdomen: Secondary | ICD-10-CM

## 2014-11-26 DIAGNOSIS — C49A3 Gastrointestinal stromal tumor of small intestine: Secondary | ICD-10-CM

## 2014-11-26 LAB — CBC WITH DIFFERENTIAL/PLATELET
BASO%: 0.9 % (ref 0.0–2.0)
Basophils Absolute: 0 10*3/uL (ref 0.0–0.1)
EOS%: 16.1 % — AB (ref 0.0–7.0)
Eosinophils Absolute: 0.8 10*3/uL — ABNORMAL HIGH (ref 0.0–0.5)
HCT: 35.3 % — ABNORMAL LOW (ref 38.4–49.9)
HGB: 11.5 g/dL — ABNORMAL LOW (ref 13.0–17.1)
LYMPH#: 1.1 10*3/uL (ref 0.9–3.3)
LYMPH%: 23.3 % (ref 14.0–49.0)
MCH: 33.5 pg — AB (ref 27.2–33.4)
MCHC: 32.5 g/dL (ref 32.0–36.0)
MCV: 102.8 fL — ABNORMAL HIGH (ref 79.3–98.0)
MONO#: 0.4 10*3/uL (ref 0.1–0.9)
MONO%: 8.5 % (ref 0.0–14.0)
NEUT#: 2.4 10*3/uL (ref 1.5–6.5)
NEUT%: 51.2 % (ref 39.0–75.0)
Platelets: 224 10*3/uL (ref 140–400)
RBC: 3.43 10*6/uL — AB (ref 4.20–5.82)
RDW: 16.3 % — AB (ref 11.0–14.6)
WBC: 4.7 10*3/uL (ref 4.0–10.3)

## 2014-11-26 LAB — COMPREHENSIVE METABOLIC PANEL (CC13)
ALT: 44 U/L (ref 0–55)
AST: 30 U/L (ref 5–34)
Albumin: 3.1 g/dL — ABNORMAL LOW (ref 3.5–5.0)
Alkaline Phosphatase: 212 U/L — ABNORMAL HIGH (ref 40–150)
Anion Gap: 9 mEq/L (ref 3–11)
BUN: 15.4 mg/dL (ref 7.0–26.0)
CO2: 27 mEq/L (ref 22–29)
CREATININE: 1.1 mg/dL (ref 0.7–1.3)
Calcium: 8.7 mg/dL (ref 8.4–10.4)
Chloride: 105 mEq/L (ref 98–109)
EGFR: 63 mL/min/{1.73_m2} — ABNORMAL LOW (ref >90)
Glucose: 102 mg/dl (ref 70–140)
POTASSIUM: 3.7 meq/L (ref 3.5–5.1)
Sodium: 141 mEq/L (ref 136–145)
Total Bilirubin: 1.16 mg/dL (ref 0.20–1.20)
Total Protein: 6.3 g/dL — ABNORMAL LOW (ref 6.4–8.3)

## 2014-11-26 MED ORDER — IMATINIB MESYLATE 400 MG PO TABS: 400.0000 mg | ORAL_TABLET | Freq: Every day | ORAL | Status: DC

## 2014-11-26 NOTE — Progress Notes (Signed)
  [place] OFFICE PROGRESS NOTE   Diagnosis:  Gastrointestinal stromal tumor  INTERVAL HISTORY:   Mr. Barabas returns as scheduled. As directed, he increased the dose of Gleevec to 400 mg beginning 11/14/2014. He denies nausea/vomiting. No diarrhea. No skin rash. Abdomen, leg and scrotal edema have improved. He denies pain. He continues to have a good appetite. He denies shortness of breath. No fever.  Objective:  Vital signs in last 24 hours:  Blood pressure 177/69, pulse 73, temperature 98.2 F (36.8 C), temperature source Oral, resp. rate 18, height $RemoveBe'5\' 7"'OzBtJvSPh$  (1.702 m), weight 163 lb (73.936 kg).    HEENT: no thrush or ulcers. Resp: rhonchi and diminished breath sounds at the left base. No respiratory distress. Cardio: regular rate and rhythm. GI: abdomen is soft and nontender. No mass. No hepatomegaly. Vascular: 1+ pitting edema at the lower legs bilaterally.  Skin: no rash.    Lab Results:  Lab Results  Component Value Date   WBC 4.7 11/26/2014   HGB 11.5* 11/26/2014   HCT 35.3* 11/26/2014   MCV 102.8* 11/26/2014   PLT 224 11/26/2014   NEUTROABS 2.4 11/26/2014    Imaging:  No results found.  Medications: I have reviewed the patient's current medications.  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 Anemia-potentially related to GI bleeding or myelodysplasia  Bone marrow biopsy 10/21/2014 revealed a hypercellular marrow with abundant iron stores, cytogenetics pending 3. NSTEMI-March 2014, cardiac catheterization 03/05/2013 confirmed an LAD occlusion   4. Presyncope event 02/27/2013 5. Admission 09/12/2014 with respiratory failure secondary to pulmonary edema 6. Edema-likely secondary to abdominal tumor and hypoalbuminemia, improved   Disposition: Mr. Dillenbeck appears stable. He appears to be tolerating Gleevec well at the increased dose (400 mg daily). Plan to continue the same. He will return for a followup visit in one month. He will contact the office in the interim with any problems.  Plan reviewed with Dr. [name].    Ned Card ANP/GNP-BC   11/26/2014  8:58 AM

## 2014-11-26 NOTE — Telephone Encounter (Signed)
Gave asv & cal for Jan/Feb.

## 2014-11-27 ENCOUNTER — Encounter (HOSPITAL_COMMUNITY): Payer: Self-pay | Admitting: Cardiology

## 2014-12-02 ENCOUNTER — Encounter (HOSPITAL_COMMUNITY): Payer: Self-pay

## 2014-12-08 IMAGING — CT CT BIOPSY
1 of 2 series · 15 of 32 positions shown, 19 images · non-contrast
Comparison: CT of the abdomen and pelvis - 09/13/2014;

INDICATION: History of GI stromal tumor, now with CT findings worrisome for
development of omental carcinomatosis. Please perform CT-guided
biopsy for tissue diagnostic purposes

EXAM:
CT GUIDED BIOPSY OF OMENTAL CAKE WITHIN THE LEFT LOWER ABDOMINAL
QUADRANT.

[Series 2: i-spiral 5.0 b40f · axial · 0.82mm/px · z∈[-80,+88]mm · 15 of 52 slices shown, 19 images]
[im 2/52  soft-tissue]
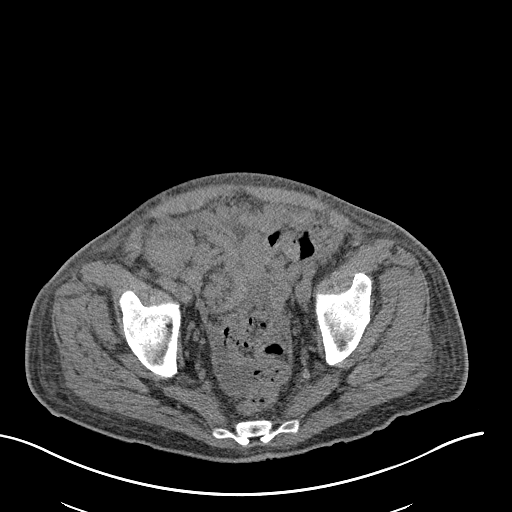
[im 2/52  bone]
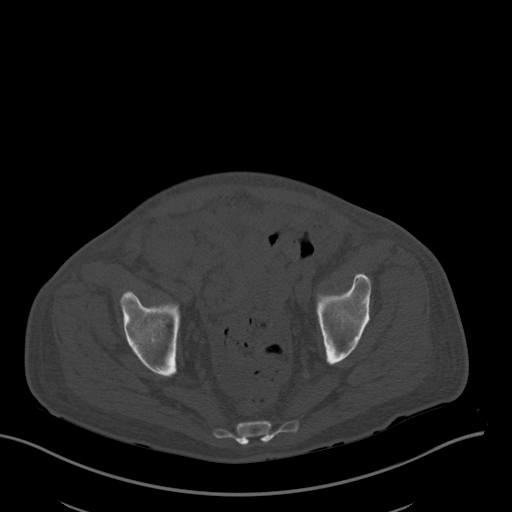
[im 6/52  soft-tissue]
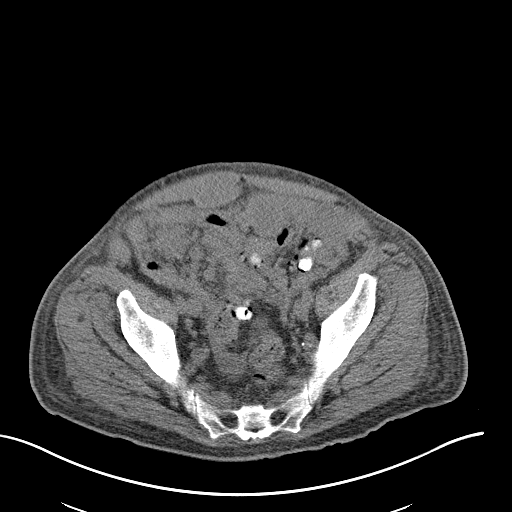
[im 10/52  soft-tissue]
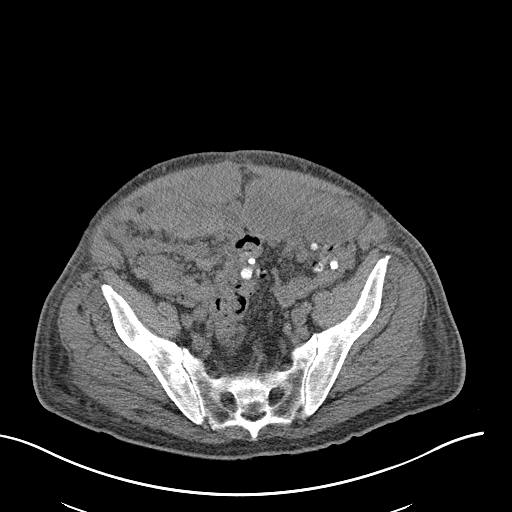
[im 14/52  soft-tissue]
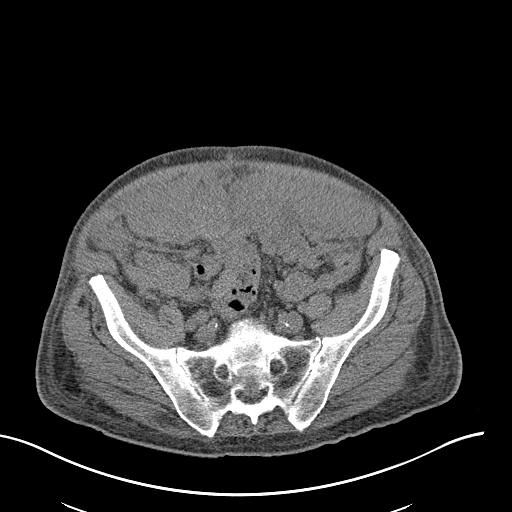
[im 18/52  soft-tissue]
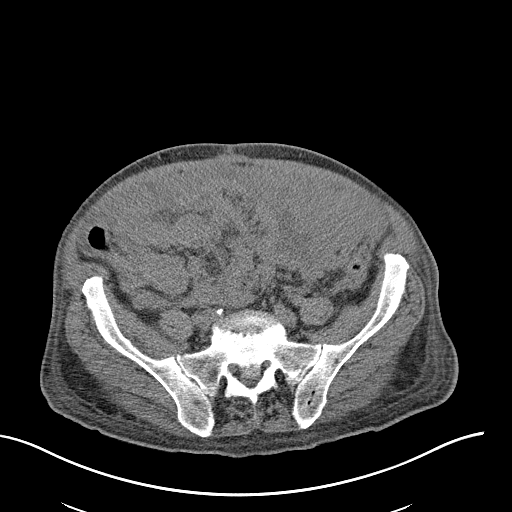
[im 22/52  soft-tissue]
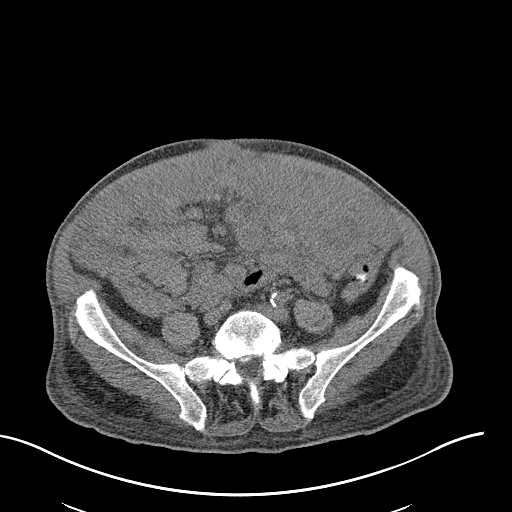
[im 26/52  soft-tissue]
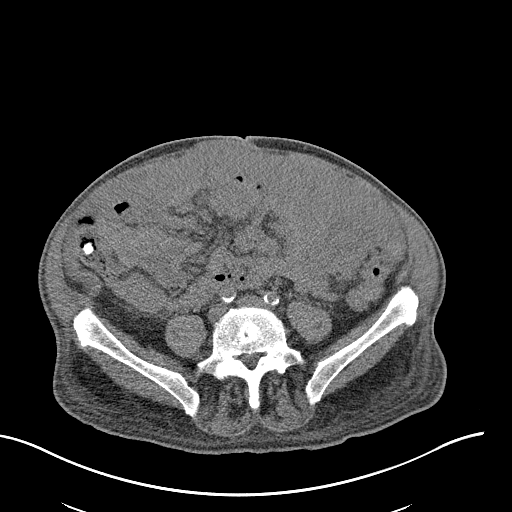
[im 30/52  soft-tissue]
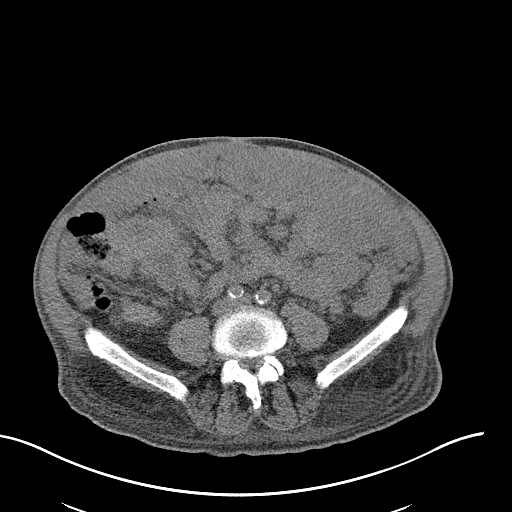
[im 34/52  soft-tissue]
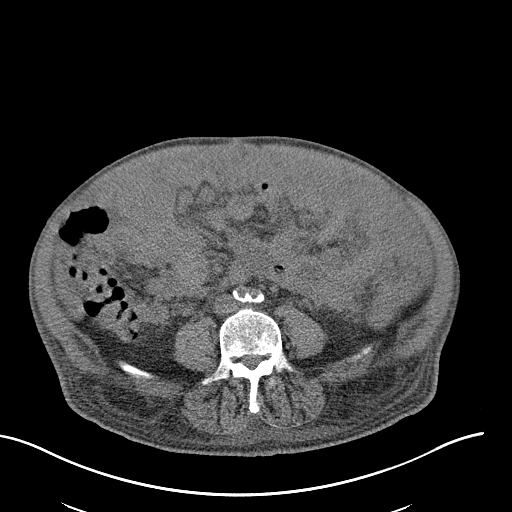
[im 34/52  bone]
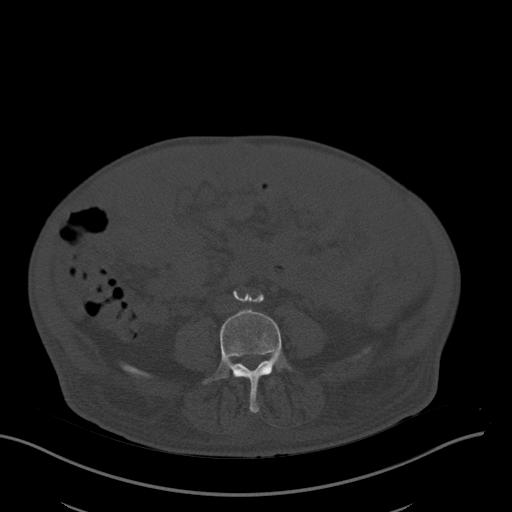
[im 38/52  soft-tissue]
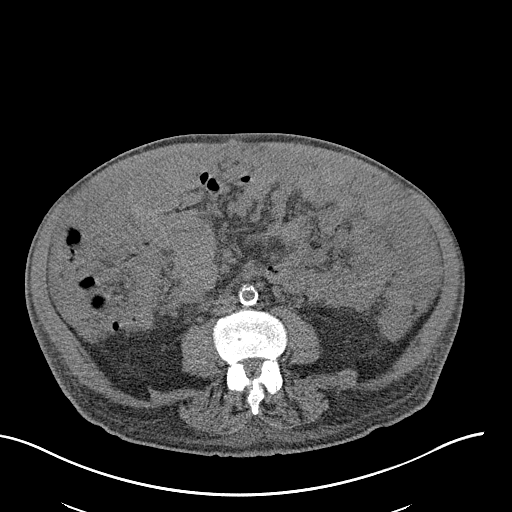
[im 42/52  soft-tissue]
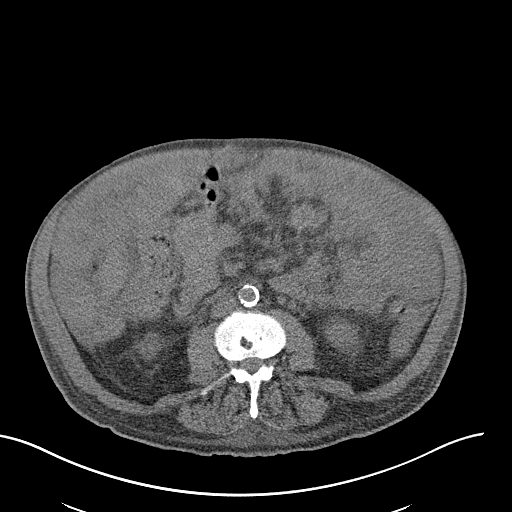
[im 44/52  lung]
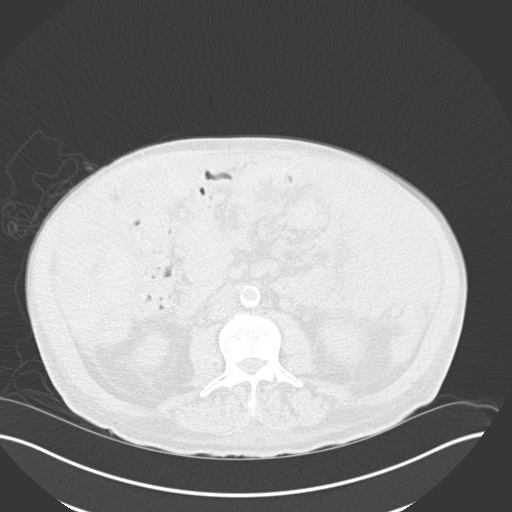
[im 46/52  soft-tissue]
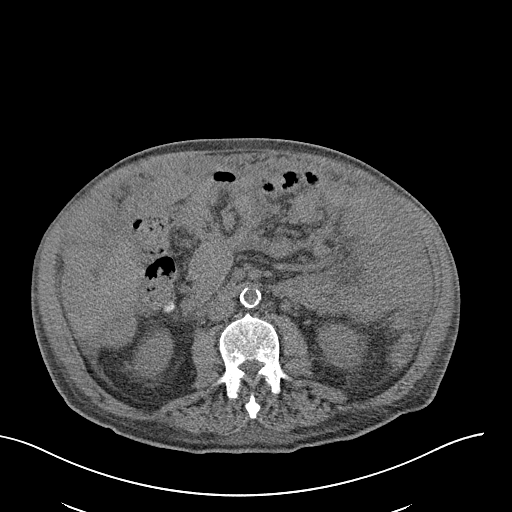
[im 46/52  lung]
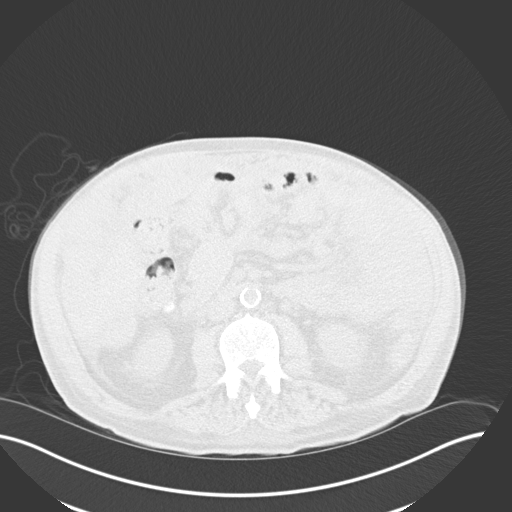
[im 48/52  lung]
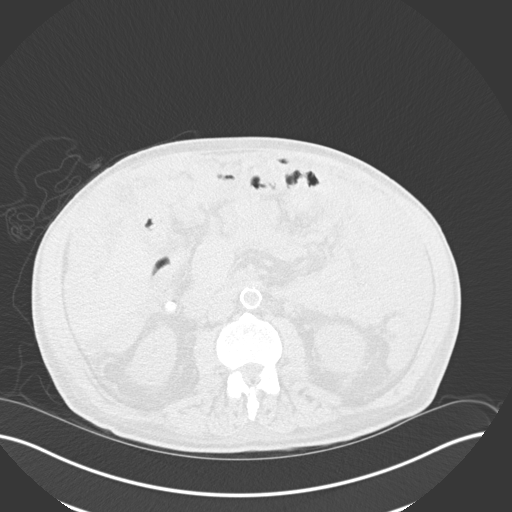
[im 50/52  soft-tissue]
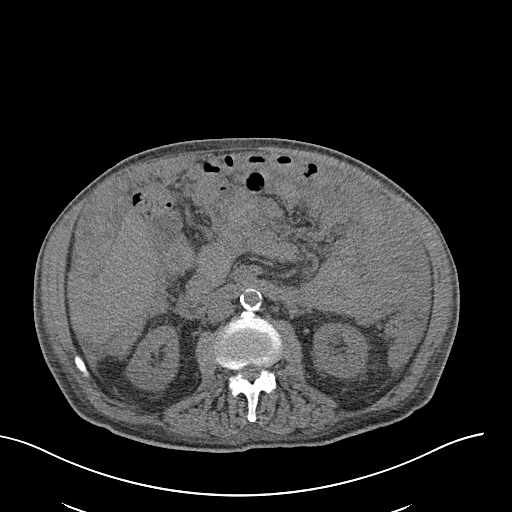
[im 50/52  lung]
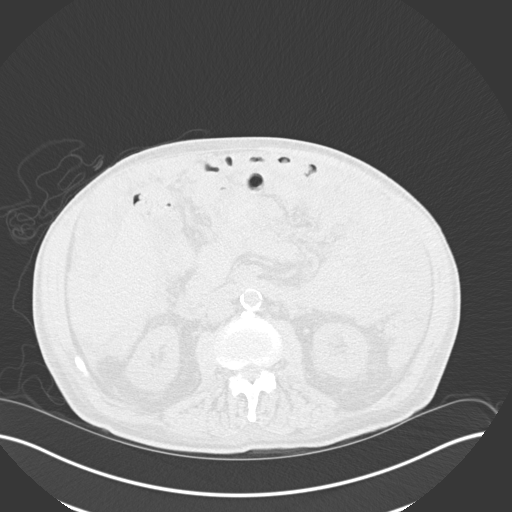

[15 of 32 positions shown; findings below may reference images not displayed]

[DATE]

MEDICATIONS:
Fentanyl 75 mcg IV; Versed 3 mg IV - administered both during the CT
guided bone marrow and omental mass biopsy

ANESTHESIA/SEDATION:
Sedation time

60 minutes

CONTRAST:  None

COMPLICATIONS:
None immediate

PROCEDURE:
Informed consent was obtained from the patient following an
explanation of the procedure, risks, benefits and alternatives. A
time out was performed prior to the initiation of the procedure.

The patient was positioned prone on the CT table and a limited CT
was performed for procedural planning demonstrating grossly
unchanged appearance of known masslike omental thickening within the
inferior aspect of the left lower abdomen. The procedure was
planned. The operative site was prepped and draped in the usual
sterile fashion. Appropriate trajectory was confirmed with a 22
gauge spinal needle after the adjacent tissues were anesthetized
with 1% Lidocaine with epinephrine.

Under intermittent CT guidance, a 17 gauge coaxial needle was
advanced into the peripheral aspect of the mass. Appropriate
positioning was confirmed and 5 core needle biopsy samples were
obtained with an 18 gauge core needle biopsy device. The co-axial
needle was removed and hemostasis was achieved with manual
compression.

A dressing was placed. The patient tolerated the procedure well
without immediate postprocedural complication.
IMPRESSION: Technically successful CT guided core needle biopsy of omental mass
within the left lower abdominal quadrant..

## 2014-12-26 ENCOUNTER — Telehealth: Payer: Self-pay

## 2014-12-26 ENCOUNTER — Telehealth: Payer: Self-pay | Admitting: Nurse Practitioner

## 2014-12-26 ENCOUNTER — Other Ambulatory Visit (HOSPITAL_BASED_OUTPATIENT_CLINIC_OR_DEPARTMENT_OTHER): Payer: Medicare Other

## 2014-12-26 ENCOUNTER — Ambulatory Visit (HOSPITAL_BASED_OUTPATIENT_CLINIC_OR_DEPARTMENT_OTHER): Payer: Medicare Other | Admitting: Nurse Practitioner

## 2014-12-26 ENCOUNTER — Other Ambulatory Visit: Payer: Self-pay

## 2014-12-26 VITALS — BP 180/72 | HR 72 | Temp 97.7°F | Resp 18 | Ht 67.0 in | Wt 156.6 lb

## 2014-12-26 DIAGNOSIS — C49A3 Gastrointestinal stromal tumor of small intestine: Secondary | ICD-10-CM

## 2014-12-26 DIAGNOSIS — D649 Anemia, unspecified: Secondary | ICD-10-CM | POA: Diagnosis not present

## 2014-12-26 DIAGNOSIS — C494 Malignant neoplasm of connective and soft tissue of abdomen: Secondary | ICD-10-CM

## 2014-12-26 LAB — CBC WITH DIFFERENTIAL/PLATELET
BASO%: 0.8 % (ref 0.0–2.0)
Basophils Absolute: 0 10*3/uL (ref 0.0–0.1)
EOS ABS: 0.4 10*3/uL (ref 0.0–0.5)
EOS%: 8.2 % — AB (ref 0.0–7.0)
HCT: 37.7 % — ABNORMAL LOW (ref 38.4–49.9)
HGB: 12.2 g/dL — ABNORMAL LOW (ref 13.0–17.1)
LYMPH#: 1.1 10*3/uL (ref 0.9–3.3)
LYMPH%: 20.9 % (ref 14.0–49.0)
MCH: 33.2 pg (ref 27.2–33.4)
MCHC: 32.4 g/dL (ref 32.0–36.0)
MCV: 102.6 fL — AB (ref 79.3–98.0)
MONO#: 0.5 10*3/uL (ref 0.1–0.9)
MONO%: 9.2 % (ref 0.0–14.0)
NEUT#: 3.3 10*3/uL (ref 1.5–6.5)
NEUT%: 60.9 % (ref 39.0–75.0)
PLATELETS: 213 10*3/uL (ref 140–400)
RBC: 3.67 10*6/uL — ABNORMAL LOW (ref 4.20–5.82)
RDW: 14.6 % (ref 11.0–14.6)
WBC: 5.4 10*3/uL (ref 4.0–10.3)

## 2014-12-26 LAB — COMPREHENSIVE METABOLIC PANEL (CC13)
ALK PHOS: 147 U/L (ref 40–150)
ALT: 42 U/L (ref 0–55)
AST: 30 U/L (ref 5–34)
Albumin: 3.5 g/dL (ref 3.5–5.0)
Anion Gap: 7 mEq/L (ref 3–11)
BUN: 25 mg/dL (ref 7.0–26.0)
CO2: 29 mEq/L (ref 22–29)
CREATININE: 1.2 mg/dL (ref 0.7–1.3)
Calcium: 8.8 mg/dL (ref 8.4–10.4)
Chloride: 106 mEq/L (ref 98–109)
EGFR: 53 mL/min/{1.73_m2} — ABNORMAL LOW (ref >90)
GLUCOSE: 98 mg/dL (ref 70–140)
Potassium: 3.7 mEq/L (ref 3.5–5.1)
SODIUM: 142 meq/L (ref 136–145)
Total Bilirubin: 0.92 mg/dL (ref 0.20–1.20)
Total Protein: 6.9 g/dL (ref 6.4–8.3)

## 2014-12-26 MED ORDER — ESOMEPRAZOLE MAGNESIUM 20 MG PO CPDR: 20.0000 mg | DELAYED_RELEASE_CAPSULE | Freq: Every day | ORAL | Status: DC

## 2014-12-26 NOTE — Progress Notes (Signed)
  [name] [name]   Diagnosis:  Gastrointestinal stromal tumor  INTERVAL HISTORY:   [name] [name] returns as scheduled. He continues Gleevec 400 mg daily. He denies nausea/vomiting. No mouth sores. No significant diarrhea. He has occasional loose stools. No skin rash. He denies shortness of breath. Abdomen, leg and scrotal edema have resolved. Over the past 3 days he has had intermittent mild pain at the right lateral abdomen/chest. Pain resolves without intervention. He reports similar pain in the past several years ago. He is experiencing indigestion periodically. He has taken Nexium in the past and would like to resume this.  Objective:  Vital signs in last 24 hours:  Blood pressure 183/66, pulse 72, temperature 97.7 F (36.5 C), temperature source Oral, resp. rate 18, height _0  (1.702 m), weight 156 lb 9.6 oz (71.033 kg), SpO2 100 %. repeat blood pressure 180/74    HEENT: No thrush or ulcers. Lymphatics: No cervical or supraclavicular lymph nodes. Resp: Breath sounds diminished at the left base. No respiratory distress. Cardio: Regular rate and rhythm. GI: Abdomen is soft and nontender. Nondistended. No hepatomegaly. No mass. Vascular: No leg edema.  Skin: Faint mildly erythematous fine rash scattered over the back.    Lab Results:  Lab Results  Component Value Date   WBC 5.4 12/26/2014   HGB 12.2* 12/26/2014   HCT 37.7* 12/26/2014   MCV 102.6* 12/26/2014   PLT 213 12/26/2014   NEUTROABS 3.3 12/26/2014    Imaging:  No results found.  Medications: I have reviewed the patient's current medications.  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 Anemia-potentially related to GI bleeding or myelodysplasia  Bone marrow biopsy 10/21/2014 revealed a hypercellular marrow with abundant iron stores, cytogenetics pending  Improved 11/26/2014 and 12/26/2014 3. NSTEMI-March 2014, cardiac catheterization 03/05/2013 confirmed an LAD occlusion  4. Presyncope event 02/27/2013 5. Admission 09/12/2014 with respiratory failure secondary to pulmonary edema 6. Edema-likely secondary to abdominal tumor and hypoalbuminemia. Resolved.   Disposition: [name] [name] appears stable. He will continue Gleevec 400 mg daily. We are referring him for restaging CT scans of the abdomen/pelvis prior to his next visit in one month.   We will send a prescription to his pharmacy for Nexium 20 mg daily. He will obtain subsequent refills from his PCP.   The etiology of the intermittent right-sided abdominal pain is unclear. He reports experiencing similar pain in the past. He will contact the office if the pain worsens or he develops any other symptoms.  [name] [name]'s blood pressure was elevated today. He is on 3 blood pressure medicines and has not taken them yet today. He will take his medications when he returns home. He has a home monitoring machine. He understands to contact our office or his PCP if the blood pressure remains elevated.   He will return for a follow-up visit on 01/22/2015. He will contact the office in the interim as outlined above or with any other problems.  Plan reviewed with Dr. [name].  Ned Card ANP/GNP-BC   12/26/2014  9:36 AM

## 2014-12-26 NOTE — Telephone Encounter (Signed)
Gave avs & cal for Feb. Also gave contrast for CT scan.

## 2014-12-26 NOTE — Telephone Encounter (Signed)
Called pt at approx 2pm this afternoon to check if BP has come down. Pt wife states they just got home so he would take his BP medications then. Called pt back at 355pm pt states he took his BP medications after the phone call earlier and rechecked his BP at 300pm and it was 164/62. Pt states he will continue to monitor it and call his PCP or here with any issues.

## 2015-01-12 ENCOUNTER — Encounter (HOSPITAL_COMMUNITY): Payer: Self-pay | Admitting: Emergency Medicine

## 2015-01-12 ENCOUNTER — Observation Stay (HOSPITAL_COMMUNITY)
Admission: EM | Admit: 2015-01-12 | Discharge: 2015-01-15 | Disposition: A | Discharge status: Home / Self Care | Payer: Medicare Other | Attending: Family Medicine | Admitting: Family Medicine

## 2015-01-12 ENCOUNTER — Emergency Department (INDEPENDENT_AMBULATORY_CARE_PROVIDER_SITE_OTHER)
Admission: EM | Admit: 2015-01-12 | Discharge: 2015-01-12 | Disposition: A | Discharge status: Home / Self Care | Payer: Medicare Other | Source: Home / Self Care | Attending: Emergency Medicine | Admitting: Emergency Medicine

## 2015-01-12 DIAGNOSIS — R1013 Epigastric pain: Secondary | ICD-10-CM

## 2015-01-12 DIAGNOSIS — I5033 Acute on chronic diastolic (congestive) heart failure: Secondary | ICD-10-CM | POA: Diagnosis present

## 2015-01-12 DIAGNOSIS — R7989 Other specified abnormal findings of blood chemistry: Secondary | ICD-10-CM | POA: Diagnosis present

## 2015-01-12 DIAGNOSIS — I2510 Atherosclerotic heart disease of native coronary artery without angina pectoris: Secondary | ICD-10-CM | POA: Diagnosis not present

## 2015-01-12 DIAGNOSIS — C494 Malignant neoplasm of connective and soft tissue of abdomen: Secondary | ICD-10-CM | POA: Diagnosis not present

## 2015-01-12 DIAGNOSIS — I252 Old myocardial infarction: Secondary | ICD-10-CM | POA: Diagnosis not present

## 2015-01-12 DIAGNOSIS — J90 Pleural effusion, not elsewhere classified: Secondary | ICD-10-CM | POA: Insufficient documentation

## 2015-01-12 DIAGNOSIS — E785 Hyperlipidemia, unspecified: Secondary | ICD-10-CM | POA: Diagnosis not present

## 2015-01-12 DIAGNOSIS — C49A3 Gastrointestinal stromal tumor of small intestine: Secondary | ICD-10-CM | POA: Diagnosis present

## 2015-01-12 DIAGNOSIS — C179 Malignant neoplasm of small intestine, unspecified: Secondary | ICD-10-CM | POA: Diagnosis not present

## 2015-01-12 DIAGNOSIS — D649 Anemia, unspecified: Secondary | ICD-10-CM | POA: Insufficient documentation

## 2015-01-12 DIAGNOSIS — Z8589 Personal history of malignant neoplasm of other organs and systems: Secondary | ICD-10-CM | POA: Diagnosis not present

## 2015-01-12 DIAGNOSIS — C481 Malignant neoplasm of specified parts of peritoneum: Secondary | ICD-10-CM | POA: Diagnosis not present

## 2015-01-12 DIAGNOSIS — I10 Essential (primary) hypertension: Secondary | ICD-10-CM | POA: Diagnosis not present

## 2015-01-12 DIAGNOSIS — R778 Other specified abnormalities of plasma proteins: Secondary | ICD-10-CM | POA: Diagnosis present

## 2015-01-12 DIAGNOSIS — K8020 Calculus of gallbladder without cholecystitis without obstruction: Secondary | ICD-10-CM | POA: Diagnosis not present

## 2015-01-12 DIAGNOSIS — C762 Malignant neoplasm of abdomen: Secondary | ICD-10-CM | POA: Diagnosis present

## 2015-01-12 DIAGNOSIS — R109 Unspecified abdominal pain: Secondary | ICD-10-CM | POA: Diagnosis present

## 2015-01-12 MED ORDER — RANITIDINE HCL 150 MG PO CAPS: 150.0000 mg | ORAL_CAPSULE | Freq: Two times a day (BID) | ORAL | Status: DC

## 2015-01-12 MED ORDER — SUCRALFATE 1 GM/10ML PO SUSP: 1.0000 g | Freq: Three times a day (TID) | ORAL | Status: DC

## 2015-01-12 NOTE — ED Notes (Signed)
Pt was seen at Urgent care today around 5pm due to centralized chest pain. Pt given ranitidine and sucralfate to start today. Pt has hx of abdominal cancer and has been seen by cardiologist and cleared for any cardiac complications. Pt is alert and oriented. Pt describes chest pain as sharp and denies any other sym

## 2015-01-12 NOTE — ED Notes (Signed)
Onset of epigastric pain started around 4:30 pm.  Patient took tums around 5:00 pm, with little relief.  Reports since lying flat on exam table, pain has decreased to 3/10.  Patient denies sob, denies any diaphoresis.  Patient does have a large, cancerous mass in abdomen-currently on oral chemo.  Just prior to having pain, ate a cookie and sat down.  Earlier today patient shoveled a small area in driveway-son is with patient curently

## 2015-01-12 NOTE — Discharge Instructions (Signed)
I think you likely have gastritis or an early stomach ulcer. Continue the Nexium. Start taking ranitidine twice a day. Take Sucralfate before meals for the next week.  Follow up with your oncologist as scheduled next week. If you develop fevers, dizziness, shortness of breath, recurrent severe pains, blood in stool, please go to the ER.

## 2015-01-12 NOTE — ED Provider Notes (Addendum)
CSN: [id number]     Arrival date & time 01/12/15  1813 History   First MD Initiated Contact with Patient 01/12/15 1838     Chief Complaint  Patient presents with  . Abdominal Pain   (Consider location/radiation/quality/duration/timing/severity/associated sxs/prior Treatment) HPI  He is an 81 year old man here with his son for evaluation of epigastric pain. He states around 4:30 this afternoon he ate a cookie and then sat down. Shortly after that he reports an indigestion pain in the epigastric region. He took 2 tums with minimal improvement. He states that currently the pain has improved. He reports maybe some mild nausea; no vomiting. He has not had any diarrhea or blood in his stool. No fevers or chills. He denies chest pain or palpitations. No shortness of breath or dizziness. He denies any abdominal distention. He was started on Nexium about a week ago.  Past Medical History  Diagnosis Date  . Hyperlipidemia     TAKES zOCOR DAILY  . Abdominal mass 02/2013  . UTI (urinary tract infection)   . Hypertension     TAKES LOTREL,HCTZ,AND METOPROLOL DAILY  . NSTEMI (non-ST elevated myocardial infarction) 02/2013    "LIGHT: ONE MD SAID HE DID AND ONE SAID HE DIDN'T  . Pneumonia     MAR 2014  . Stromal tumor of digestive system   . CAD (coronary artery disease)   . Shortness of breath   . Acute respiratory failure with hypoxia 09/12/2014   Past Surgical History  Procedure Laterality Date  . Cyst excision  1962    wrist  . Cardiac catheterization  03-05-13  . Laparotomy N/A 06/04/2013    Procedure: EXPLORATORY LAPAROTOMY, OPEN RESECTION OF MESENTERIC AND INTESTINAL MASS;  Surgeon: [name], MD;  Location: [facility];  Service: General;  Laterality: N/A;  Small Bowel Resection  . Colonoscopy  09/08/2014    dr scooler  . Left heart catheterization with coronary angiogram N/A 03/05/2013    Procedure: LEFT HEART CATHETERIZATION WITH CORONARY ANGIOGRAM;  Surgeon: [name], MD;  Location:  [facility] CATH LAB;  Service: Cardiovascular;  Laterality: N/A;   No family history on file. History  Substance Use Topics  . Smoking status: Never Smoker   . Smokeless tobacco: Never Used  . Alcohol Use: No    Review of Systems  Constitutional: Negative for fever and chills.  Respiratory: Negative for cough and shortness of breath.   Cardiovascular: Negative for chest pain and palpitations.  Gastrointestinal: Positive for nausea and abdominal pain. Negative for vomiting, diarrhea and blood in stool.  Neurological: Negative for dizziness.    Allergies  Review of patient's allergies indicates no known allergies.  Home Medications   Prior to Admission medications   Medication Sig Start Date End Date Taking? Authorizing Provider  benazepril (LOTENSIN) 20 MG tablet Take 20 mg by mouth daily. 04/08/14   Historical Provider, MD  Cyanocobalamin (B-12 PO) Take 1 tablet by mouth daily.     Historical Provider, MD  esomeprazole (NEXIUM) 20 MG capsule Take 1 capsule (20 mg total) by mouth daily at 12 noon. 12/26/14   Owens Shark, NP  GLEEVEC 100 MG tablet  10/30/14   Historical Provider, MD  hydrochlorothiazide (HYDRODIURIL) 25 MG tablet Take 25 mg by mouth daily.    Historical Provider, MD  imatinib (GLEEVEC) 400 MG tablet Take 1 tablet (400 mg total) by mouth daily. Take with meals and large glass of water.Caution:Chemotherapy. 11/26/14   Ladell Pier, MD  IRON PO Take  1 tablet by mouth daily. NOT TAKING< PHYSICIAN CHANGED THERAPY    Historical Provider, MD  metoprolol tartrate (LOPRESSOR) 25 MG tablet Take 1 tablet (25 mg total) by mouth 2 (two) times daily. 03/06/13   Costin Karlyne Greenspan, MD  ranitidine (ZANTAC) 150 MG capsule Take 1 capsule (150 mg total) by mouth 2 (two) times daily. 01/12/15   Melony Overly, MD  simvastatin (ZOCOR) 20 MG tablet Take 20 mg by mouth every evening.    Historical Provider, MD  sucralfate (CARAFATE) 1 GM/10ML suspension Take 10 mLs (1 g total) by mouth 3 (three) times  daily with meals. 01/12/15   Melony Overly, MD   BP 180/60 mmHg  Pulse 68  Temp(Src) 97.9 F (36.6 C) (Oral)  Resp 20  SpO2 98% Physical Exam  Constitutional: He is oriented to person, place, and time. He appears well-developed and well-nourished. No distress.  HENT:  Head: Normocephalic and atraumatic.  Neck: Neck supple.  Cardiovascular: Normal rate, regular rhythm and normal heart sounds.   No murmur heard. Pulmonary/Chest: Effort normal and breath sounds normal. No respiratory distress. He has no wheezes. He has no rales.  Abdominal: Soft. Bowel sounds are normal. He exhibits no distension and no mass. There is no tenderness. There is no rebound and no guarding.  Neurological: He is alert and oriented to person, place, and time.    ED Course  ED EKG  Date/Time: 01/12/2015 7:06 PM Performed by: Melony Overly Authorized by: Melony Overly Interpreted by ED physician Comparison: compared with previous ECG  Similar to previous ECG Rhythm: sinus rhythm Ectopy comments: PAC Rate: normal QRS axis: normal Conduction: conduction normal ST Segments: ST segments normal T Waves: T waves normal Clinical impression: normal ECG   (including critical care time) Labs Review Labs Reviewed - No data to display  Imaging Review No results found.   MDM   1. Epigastric pain    His pain has improved and his abdominal exam is benign. He has a follow-up CT scan scheduled for next week with an appointment with his oncologist next week as well. I suspect this is gastritis secondary to the [place]. Will add ranitidine and sucralfate. Reviewed strict return precautions with patient and son.    Melony Overly, MD 01/12/15 [place] Collyn Ribas, MD 01/12/15 [phone]

## 2015-01-13 ENCOUNTER — Encounter (HOSPITAL_COMMUNITY): Payer: Self-pay | Admitting: Internal Medicine

## 2015-01-13 ENCOUNTER — Other Ambulatory Visit: Payer: Self-pay | Admitting: Oncology

## 2015-01-13 ENCOUNTER — Emergency Department (HOSPITAL_COMMUNITY): Payer: Medicare Other

## 2015-01-13 DIAGNOSIS — R1013 Epigastric pain: Principal | ICD-10-CM

## 2015-01-13 DIAGNOSIS — C179 Malignant neoplasm of small intestine, unspecified: Secondary | ICD-10-CM

## 2015-01-13 DIAGNOSIS — I214 Non-ST elevation (NSTEMI) myocardial infarction: Secondary | ICD-10-CM | POA: Diagnosis not present

## 2015-01-13 DIAGNOSIS — I5033 Acute on chronic diastolic (congestive) heart failure: Secondary | ICD-10-CM

## 2015-01-13 DIAGNOSIS — R7989 Other specified abnormal findings of blood chemistry: Secondary | ICD-10-CM | POA: Diagnosis not present

## 2015-01-13 DIAGNOSIS — I213 ST elevation (STEMI) myocardial infarction of unspecified site: Secondary | ICD-10-CM | POA: Diagnosis not present

## 2015-01-13 DIAGNOSIS — C481 Malignant neoplasm of specified parts of peritoneum: Secondary | ICD-10-CM | POA: Diagnosis not present

## 2015-01-13 DIAGNOSIS — I10 Essential (primary) hypertension: Secondary | ICD-10-CM | POA: Diagnosis not present

## 2015-01-13 DIAGNOSIS — C762 Malignant neoplasm of abdomen: Secondary | ICD-10-CM

## 2015-01-13 DIAGNOSIS — R109 Unspecified abdominal pain: Secondary | ICD-10-CM | POA: Diagnosis present

## 2015-01-13 DIAGNOSIS — K8020 Calculus of gallbladder without cholecystitis without obstruction: Secondary | ICD-10-CM | POA: Diagnosis not present

## 2015-01-13 LAB — COMPREHENSIVE METABOLIC PANEL
ALT: 38 U/L (ref 0–53)
AST: 25 U/L (ref 0–37)
Albumin: 3.8 g/dL (ref 3.5–5.2)
Alkaline Phosphatase: 103 U/L (ref 39–117)
Anion gap: 10 (ref 5–15)
BILIRUBIN TOTAL: 0.7 mg/dL (ref 0.3–1.2)
BUN: 22 mg/dL (ref 6–23)
CALCIUM: 9.1 mg/dL (ref 8.4–10.5)
CO2: 28 mmol/L (ref 19–32)
Chloride: 103 mmol/L (ref 96–112)
Creatinine, Ser: 1.06 mg/dL (ref 0.50–1.35)
GFR calc Af Amer: 73 mL/min — ABNORMAL LOW (ref >90)
GFR calc non Af Amer: 63 mL/min — ABNORMAL LOW (ref >90)
GLUCOSE: 160 mg/dL — AB (ref 70–99)
Potassium: 3.9 mmol/L (ref 3.5–5.1)
Sodium: 141 mmol/L (ref 135–145)
Total Protein: 6.7 g/dL (ref 6.0–8.3)

## 2015-01-13 LAB — CBC WITH DIFFERENTIAL/PLATELET
Basophils Absolute: 0 10*3/uL (ref 0.0–0.1)
Basophils Relative: 0 % (ref 0–1)
Eosinophils Absolute: 0 10*3/uL (ref 0.0–0.7)
Eosinophils Relative: 0 % (ref 0–5)
HCT: 35.6 % — ABNORMAL LOW (ref 39.0–52.0)
Hemoglobin: 12.1 g/dL — ABNORMAL LOW (ref 13.0–17.0)
Lymphocytes Relative: 7 % — ABNORMAL LOW (ref 12–46)
Lymphs Abs: 0.5 10*3/uL — ABNORMAL LOW (ref 0.7–4.0)
MCH: 34.3 pg — ABNORMAL HIGH (ref 26.0–34.0)
MCHC: 34 g/dL (ref 30.0–36.0)
MCV: 100.8 fL — ABNORMAL HIGH (ref 78.0–100.0)
Monocytes Absolute: 0.5 10*3/uL (ref 0.1–1.0)
Monocytes Relative: 8 % (ref 3–12)
Neutro Abs: 5.5 10*3/uL (ref 1.7–7.7)
Neutrophils Relative %: 85 % — ABNORMAL HIGH (ref 43–77)
PLATELETS: 197 10*3/uL (ref 150–400)
RBC: 3.53 MIL/uL — AB (ref 4.22–5.81)
RDW: 13.6 % (ref 11.5–15.5)
WBC: 6.6 10*3/uL (ref 4.0–10.5)

## 2015-01-13 LAB — PROTIME-INR
INR: 1.05 (ref 0.00–1.49)
PROTHROMBIN TIME: 13.8 s (ref 11.6–15.2)

## 2015-01-13 LAB — CK: Total CK: 61 U/L (ref 7–232)

## 2015-01-13 LAB — TROPONIN I
TROPONIN I: 0.06 ng/mL — AB (ref <0.031)
Troponin I: 0.09 ng/mL — ABNORMAL HIGH (ref <0.031)
Troponin I: 0.07 ng/mL — ABNORMAL HIGH (ref <0.031)
Troponin I: 0.05 ng/mL — ABNORMAL HIGH (ref <0.031)

## 2015-01-13 LAB — LIPASE, BLOOD: Lipase: 33 U/L (ref 11–59)

## 2015-01-13 LAB — GLUCOSE, CAPILLARY: Glucose-Capillary: 132 mg/dL — ABNORMAL HIGH (ref 70–99)

## 2015-01-13 MED ORDER — SIMVASTATIN 20 MG PO TABS
20.0000 mg | ORAL_TABLET | Freq: Every evening | ORAL | Status: DC
  Administered 2015-01-13 – 2015-01-14 (×2): 20 mg via ORAL
  Filled 2015-01-13 (×3): qty 1

## 2015-01-13 MED ORDER — SODIUM CHLORIDE 0.9 % IV SOLN
INTRAVENOUS | Status: AC
  Administered 2015-01-13 (×2): via INTRAVENOUS

## 2015-01-13 MED ORDER — HYDROMORPHONE HCL 1 MG/ML IJ SOLN
1.0000 mg | INTRAMUSCULAR | Status: DC | PRN
  Filled 2015-01-13: qty 1

## 2015-01-13 MED ORDER — SODIUM CHLORIDE 0.9 % IJ SOLN
3.0000 mL | Freq: Two times a day (BID) | INTRAMUSCULAR | Status: DC
  Administered 2015-01-14: 3 mL via INTRAVENOUS

## 2015-01-13 MED ORDER — ONDANSETRON HCL 4 MG/2ML IJ SOLN
4.0000 mg | Freq: Four times a day (QID) | INTRAMUSCULAR | Status: DC | PRN
  Administered 2015-01-13: 4 mg via INTRAVENOUS
  Filled 2015-01-13: qty 2

## 2015-01-13 MED ORDER — BENAZEPRIL HCL 20 MG PO TABS
20.0000 mg | ORAL_TABLET | Freq: Every day | ORAL | Status: DC
  Administered 2015-01-13 – 2015-01-15 (×3): 20 mg via ORAL
  Filled 2015-01-13 (×3): qty 1

## 2015-01-13 MED ORDER — ONDANSETRON HCL 4 MG PO TABS: 4.0000 mg | ORAL_TABLET | Freq: Four times a day (QID) | ORAL | Status: DC | PRN

## 2015-01-13 MED ORDER — IMATINIB MESYLATE 100 MG PO TABS
400.0000 mg | ORAL_TABLET | Freq: Every day | ORAL | Status: DC
  Administered 2015-01-13 – 2015-01-14 (×2): 400 mg via ORAL
  Filled 2015-01-13 (×3): qty 4

## 2015-01-13 MED ORDER — SUCRALFATE 1 GM/10ML PO SUSP
1.0000 g | Freq: Three times a day (TID) | ORAL | Status: DC
  Administered 2015-01-13 – 2015-01-15 (×7): 1 g via ORAL
  Filled 2015-01-13 (×10): qty 10

## 2015-01-13 MED ORDER — TEMAZEPAM 7.5 MG PO CAPS
7.5000 mg | ORAL_CAPSULE | Freq: Every evening | ORAL | Status: DC | PRN
  Administered 2015-01-13 – 2015-01-14 (×2): 7.5 mg via ORAL
  Filled 2015-01-13 (×2): qty 1

## 2015-01-13 MED ORDER — HYDROMORPHONE HCL 1 MG/ML IJ SOLN
1.0000 mg | Freq: Once | INTRAMUSCULAR | Status: AC
  Administered 2015-01-13: 1 mg via INTRAVENOUS
  Filled 2015-01-13: qty 1

## 2015-01-13 MED ORDER — PANTOPRAZOLE SODIUM 40 MG IV SOLR
40.0000 mg | Freq: Once | INTRAVENOUS | Status: AC
  Administered 2015-01-13: 40 mg via INTRAVENOUS
  Filled 2015-01-13: qty 40

## 2015-01-13 MED ORDER — METOPROLOL TARTRATE 25 MG PO TABS: 12.5000 mg | ORAL_TABLET | Freq: Two times a day (BID) | ORAL | Status: DC

## 2015-01-13 MED ORDER — PANTOPRAZOLE SODIUM 40 MG IV SOLR
40.0000 mg | INTRAVENOUS | Status: DC
  Administered 2015-01-13 – 2015-01-14 (×2): 40 mg via INTRAVENOUS
  Filled 2015-01-13 (×3): qty 40

## 2015-01-13 MED ORDER — HYDROMORPHONE HCL 1 MG/ML IJ SOLN
1.0000 mg | INTRAMUSCULAR | Status: DC | PRN
  Administered 2015-01-13: 1 mg via INTRAVENOUS
  Filled 2015-01-13: qty 1

## 2015-01-13 MED ORDER — HEPARIN SODIUM (PORCINE) 5000 UNIT/ML IJ SOLN
5000.0000 [IU] | Freq: Three times a day (TID) | INTRAMUSCULAR | Status: DC
  Filled 2015-01-13 (×9): qty 1

## 2015-01-13 MED ORDER — HYDROCHLOROTHIAZIDE 25 MG PO TABS
25.0000 mg | ORAL_TABLET | Freq: Every day | ORAL | Status: DC
  Administered 2015-01-13 – 2015-01-15 (×3): 25 mg via ORAL
  Filled 2015-01-13 (×3): qty 1

## 2015-01-13 NOTE — ED Notes (Signed)
Janett Billow, RN/Charge notified that patient would be arriving from the ED.

## 2015-01-13 NOTE — H&P (Addendum)
[facility] History and Physical  [name] [name] KWI:[id number] DOB: [birth year] DOA: 01/12/2015  Referring physician: ED physician PCP: Abigail Miyamoto, MD  Specialists:   Chief Complaint: Abdominal pain  HPI: [name] [name] is a 81 y.o. male with past medical history of hypertension, hyperlipidemia, coronary artery disease, diastolic congestive heart failure, abdominal carcinomatosis, who presents with  abdominal pain.  Patient reports that his abdominal pain started at about 4 PM. It is located in epigastric area, constant, 9 out of 10 in severity, nonradiating. It is not aggravated or alleviated by any known factors. Patient has mild nausea, but no vomiting, diarrhea.  Patient was evaluated in the urgent careat 6 PM, and was discharged on Ranitidine and Sucralfate. He took these two medications at home without significant improvement. Therefore he called nurse line and was advised to come to emergency room for further evaluation and treatment. He has not had blood in his stool. Patient denies fever, chills, headaches, cough, chest pain, SOB, diarrhea, constipation, dysuria, urgency, frequency, hematuria, skin rashes, or leg swelling.  Work up in the ED demonstrates negative lipase. Slightly elevated troponin 0.09-->0.07, EKG has occasional skipped beat. Afebrile. CT abdomen/pelvis showed peritoneal and omental metastatic disease with free fluid in the abdomen. These changes are improving since previous study per radiologist. Cholelithiasis and left pleural effusion with basilar atelectasis or consolidation. Patient is admitted to inpatient for further evaluation and treatment.  Review of Systems: As presented in the history of presenting illness, rest negative.  Where does patient live?  At home Can patient participate in ADLs? barely  Allergy: No Known Allergies  Past Medical History  Diagnosis Date  . Hyperlipidemia     TAKES zOCOR DAILY  . Abdominal mass 02/2013  . UTI (urinary tract  infection)   . Hypertension     TAKES LOTREL,HCTZ,AND METOPROLOL DAILY  . NSTEMI (non-ST elevated myocardial infarction) 02/2013    "LIGHT: ONE MD SAID HE DID AND ONE SAID HE DIDN'T  . Pneumonia     MAR 2014  . Stromal tumor of digestive system   . CAD (coronary artery disease)   . Shortness of breath   . Acute respiratory failure with hypoxia 09/12/2014    Past Surgical History  Procedure Laterality Date  . Cyst excision  1962    wrist  . Cardiac catheterization  03-05-13  . Laparotomy N/A 06/04/2013    Procedure: EXPLORATORY LAPAROTOMY, OPEN RESECTION OF MESENTERIC AND INTESTINAL MASS;  Surgeon: [name], MD;  Location: [place];  Service: General;  Laterality: N/A;  Small Bowel Resection  . Colonoscopy  09/08/2014    dr scooler  . Left heart catheterization with coronary angiogram N/A 03/05/2013    Procedure: LEFT HEART CATHETERIZATION WITH CORONARY ANGIOGRAM;  Surgeon: [name], MD;  Location: [facility] CATH LAB;  Service: Cardiovascular;  Laterality: N/A;    Social History:  reports that he has never smoked. He has never used smokeless tobacco. He reports that he does not drink alcohol or use illicit drugs.  Family History:  Family History  Problem Relation Age of Onset  . Skin cancer Brother      Prior to Admission medications   Medication Sig Start Date End Date Taking? Authorizing Provider  benazepril (LOTENSIN) 20 MG tablet Take 20 mg by mouth daily. 04/08/14  Yes Historical Provider, MD  hydrochlorothiazide (HYDRODIURIL) 25 MG tablet Take 25 mg by mouth daily.   Yes Historical Provider, MD  imatinib (GLEEVEC) 400 MG tablet Take 1 tablet (400 mg  total) by mouth daily. Take with meals and large glass of water.Caution:Chemotherapy. 11/26/14  Yes Ladell Pier, MD  metoprolol tartrate (LOPRESSOR) 25 MG tablet Take 1 tablet (25 mg total) by mouth 2 (two) times daily. 03/06/13  Yes Costin Karlyne Greenspan, MD  ranitidine (ZANTAC) 150 MG capsule Take 1 capsule (150 mg total) by  mouth 2 (two) times daily. 01/12/15  Yes Melony Overly, MD  simvastatin (ZOCOR) 20 MG tablet Take 20 mg by mouth every evening.   Yes Historical Provider, MD  sucralfate (CARAFATE) 1 GM/10ML suspension Take 10 mLs (1 g total) by mouth 3 (three) times daily with meals. 01/12/15  Yes Melony Overly, MD  esomeprazole (NEXIUM) 20 MG capsule Take 1 capsule (20 mg total) by mouth daily at 12 noon. Patient not taking: Reported on 01/12/2015 12/26/14   Owens Shark, NP    Physical Exam: Filed Vitals:   01/12/15 2227 01/13/15 0310 01/13/15 0523 01/13/15 0524  BP: 162/54 159/60 163/65 163/65  Pulse: 53 58  62  Temp: 97.7 F (36.5 C) 97.9 F (36.6 C) 98.9 F (37.2 C) 98.9 F (37.2 C)  TempSrc: Oral Oral Oral Oral  Resp: 16 16 20 20   SpO2: 98% 99% 93% 93%   General: Not in acute distress HEENT:       Eyes: PERRL, EOMI, no scleral icterus       ENT: No discharge from the ears and nose, no pharynx injection, no tonsillar enlargement.        Neck: No JVD, no bruit, no mass felt. Cardiac: S1/S2, RRR, No murmurs, No gallops or rubs Pulm: Good air movement bilaterally. Clear to auscultation bilaterally. No rales, wheezing, rhonchi or rubs. Abd: Soft, distended, mild tender diffusely, no rebound pain, BS present Ext: No edema bilaterally. 2+DP/PT pulse bilaterally Musculoskeletal: No joint deformities, erythema, or stiffness, ROM full Skin: No rashes.  Neuro: Alert and oriented X3, cranial nerves II-XII grossly intact, muscle strength 5/5 in all extremeties, sensation to light touch intact.  Psych: Patient is not psychotic, no suicidal or hemocidal ideation.  Labs on Admission:  Basic Metabolic Panel:  Recent Labs Lab 01/13/15 0100  NA 141  K 3.9  CL 103  CO2 28  GLUCOSE 160*  BUN 22  CREATININE 1.06  CALCIUM 9.1   Liver Function Tests:  Recent Labs Lab 01/13/15 0100  AST 25  ALT 38  ALKPHOS 103  BILITOT 0.7  PROT 6.7  ALBUMIN 3.8    Recent Labs Lab 01/13/15 0100  LIPASE 33    No results for input(s): AMMONIA in the last 168 hours. CBC:  Recent Labs Lab 01/13/15 0100  WBC 6.6  NEUTROABS 5.5  HGB 12.1*  HCT 35.6*  MCV 100.8*  PLT 197   Cardiac Enzymes:  Recent Labs Lab 01/13/15 0100 01/13/15 0504  TROPONINI 0.09* 0.07*    BNP (last 3 results)  Recent Labs  09/12/14 0445  PROBNP 5726.0*   CBG: No results for input(s): GLUCAP in the last 168 hours.  Radiological Exams on Admission: Ct Abdomen Pelvis Wo Contrast  01/13/2015   CLINICAL DATA:  Epigastric pain for several hr. Mild nausea. History of abdominal laparotomy to remove stromal tumor on 06/14. Oral chemotherapy.  EXAM: CT ABDOMEN AND PELVIS WITHOUT CONTRAST  TECHNIQUE: Multidetector CT imaging of the abdomen and pelvis was performed following the standard protocol without IV contrast.  COMPARISON:  10/16/2014  FINDINGS: Moderate size left pleural effusion with basilar atelectasis. Cardiac enlargement.  Evaluation of solid organs  and vascular structures is limited due to lack of IV contrast material. There is mild abdominal ascites. This is mostly around the liver edge. Residual mesenteric edema and nodular mesenteric and peritoneal disease consistent with known metastatic disease. Peritoneal disease is improving since the previous study consistent with response to interval therapy. Soft tissue mass in the anterior abdomen at the level of the inferior liver edge measuring 2.3 x 3.5 cm. This likely represents residual disease. This mass appears smaller than on previous study. Multiple stones are demonstrated in the gallbladder. No gallbladder wall thickening or edema. The liver, spleen, pancreas, adrenal glands, kidneys, inferior vena cava, and retroperitoneal lymph nodes are unremarkable. Diffuse calcification of the abdominal aorta without aneurysm. Stomach, small bowel, and colon are decompressed. There appears to be a bowel anastomosis in the left lower quadrant. No free air in the abdomen.   Pelvis: Prostate gland is not enlarged. Bladder wall is not thickened. Small amount of free fluid in the pelvis. Appendix is normal. No pelvic lymphadenopathy. Degenerative changes in the spine. No destructive bone lesions.  IMPRESSION: Peritoneal and omental metastatic disease with free fluid in the abdomen. These changes are improving since previous study. Cholelithiasis. Left pleural effusion with basilar atelectasis or consolidation.   Electronically Signed   By: Lucienne Capers M.D.   On: 01/13/2015 04:37    EKG: Independently reviewed. Occasional skipped beat  Assessment/Plan Principal Problem:   Abdominal pain Active Problems:   NSTEMI (non-ST elevated myocardial infarction)   GIST (gastrointestinal stromal tumor) of small bowel, malignant   Hyperlipidemia   Hypertension   Diastolic CHF, acute on chronic   Elevated troponin I level   Abdominal carcinomatosis  Abdominal pain: It is most likely due to abdominal carcinomatosis, which has not changed significantly on CT abdomen/pelvis. Other possibilities are GERD or side effects of Gleevec? -will admit to tele bed given elevated trop -NPO -protonix IV -continue Sucrafate -IVF: NS 100cc/h for 6 hours  abdominal carcinomatosis: s/p of surgery on 2014. Patient has been followed up by Dr. [name]. Currently patient is on [place]. -will continue [place] -patient want Dr. [name] be notified  Elevated trop and CAD: Patient does not have chest pain. Of course, his epigastric pain could be cardiac etiology. Trop is trending down 0.09-->0.07. It is likely due to stress secondary to abdominal pain. -will continue trop x 3 -repeat EKG at 9:00 AM -continue Zocor - He could not tolerate aspirin due to intra-abdominal hemorrhage. -will d/c metoprolol due to bradycardia and occasional skipped heartbeats  HTN: -continue home medications: Hydrochlorothiazide, metoprolol, Lotensin   DVT ppx: SQ Heparin       Code Status: Full  code Family Communication:  Yes, patient's son and wife    at bed side Disposition Plan: Admit to inpatient   Date of Service 01/13/2015    Alesa Echevarria, [facility] Pager [phone]  If 7PM-7AM, please contact night-coverage [email or web address] Password [facility] 01/13/2015, 6:59 AM

## 2015-01-13 NOTE — Progress Notes (Signed)
Echocardiogram 2D Echocardiogram has been performed.  Ramondo, Dietze 01/13/2015, 12:33 PM

## 2015-01-13 NOTE — ED Notes (Signed)
Patient transported to CT 

## 2015-01-13 NOTE — Progress Notes (Signed)
[name] [name]   DOB:06-29-1929   CZ#:[id number]   FUX#:[id number]  Patient Care Team: Abigail Miyamoto, MD as PCP - General (Family Medicine)  Subjective: [name] [name] is an 81 year old man with a history of GIST On Gleevec, admitted with severe epigastric abdominal pain. This pain was constant, and localized. He also had some nausea, but without vomiting or diarrhea. Prior to admission he was evaluated at urgent care, discharged with ranitidine and sucralfate without improvement of symptoms. On admission, the patient denied denied fevers, chills, night sweats, vision changes, or mucositis. He denies any appetite changes or weight loss. Denies any respiratory complaints. Denies any chest pain or palpitations. Denies lower extremity swelling. Denies any dysuria. Denies abnormal skin rashes, or neuropathy. Denies any bleeding issues such as epistaxis, hematemesis, hematuria or hematochezia. Ambulating with assistance without difficulty.   CT of the abdomen and pelvis showed peritoneal and omental metastatic disease with free fluid in the abdomen. Compared to prior CT, these changes are improving. In addition, cholelithiasis and left pleural effusion were seen. Labs were remarkable for negative lipase, slightly elevated troponin with an EKG showing occasional skipped beat. CBC showed mild anemia with a hemoglobin of 12.1 but with normal white count and platelets.We were kindly informed of the patient's admission.  Brief Oncological History:  1.Gastrointestinal stromal tumor of the small bowel, stage II (T3, N0, M0), status post primary resection on 06/04/2013 , he declined adjuvant Gleevec   CT abdomen/pelvis 10/16/2014 consistent with extensive carcinomatosis  CT-guided biopsy of an omental mass 10/21/2014 confirmed a gastrointestinal stromal tumor  Initiation of Gleevec at a dose of 300 mg daily on 10/31/2014  Gleevec changed 400 mg daily on 11/14/2014 2. Anemia-potentially related to GI bleeding or  myelodysplasia  Bone marrow biopsy 10/21/2014 revealed a hypercellular marrow with abundant iron stores, cytogenetics revealed the presence of 2 clonal cell lines. 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 Case #[id number] Dr.Pettenati (10/24/14) 3. NSTEMI-March 2014, cardiac catheterization 03/05/2013 confirmed an LAD occlusion  4. Presyncope event 02/27/2013 5. admission 09/12/2014 with respiratory failure secondary to pulmonary edema 6. Edema-likely secondary to abdominal tumor and hypoalbuminemia, resolved   Scheduled Meds: . benazepril  20 mg Oral Daily  . heparin  5,000 Units Subcutaneous 3 times per day  . hydrochlorothiazide  25 mg Oral Daily  . imatinib  400 mg Oral Q lunch  . pantoprazole (PROTONIX) IV  40 mg Intravenous Q24H  . simvastatin  20 mg Oral QPM  . sodium chloride  3 mL Intravenous Q12H  . sucralfate  1 g Oral TID WC   Continuous Infusions: . sodium chloride 100 mL/hr at 01/13/15 0829   PRN Meds:HYDROmorphone (DILAUDID) injection, ondansetron **OR** ondansetron (ZOFRAN) IV   Objective:  Filed Vitals:   01/13/15 0755  BP: 149/52  Pulse: 70  Temp: 98.4 F (36.9 C)  Resp: 18     No intake or output data in the 24 hours ending 01/13/15 0920  ECOG PERFORMANCE STATUS:2  GENERAL:alert, no distress and comfortable SKIN: Mild fine erythematous papular rash over the trunk EYES: normal, conjunctiva are pink and non-injected, sclera clear OROPHARYNX:no exudate, no erythema and lips, buccal mucosa, and tongue normal  NECK: supple, thyroid normal size, non-tender, without nodularity LYMPH:  no palpable lymphadenopathy in the cervical, axillary or inguinal LUNGS: clear to auscultation and percussion with normal breathing effort HEART: regular rate & rhythm and no murmurs and no lower extremity edema ABDOMEN: Soft,  no hepatosplenomegaly, no mass, no tenderness. Examination of the  subxiphoid area is unremarkable.. Musculoskeletal:no cyanosis of digits and no clubbing  PSYCH: alert & oriented x 3 with fluent speech NEURO: no focal motor/sensory deficits    CBG (last 3)   Recent Labs  01/13/15 0812  GLUCAP 132*     Labs:   Recent Labs Lab 01/13/15 0100  WBC 6.6  HGB 12.1*  HCT 35.6*  PLT 197  MCV 100.8*  MCH 34.3*  MCHC 34.0  RDW 13.6  LYMPHSABS 0.5*  MONOABS 0.5  EOSABS 0.0  BASOSABS 0.0     Chemistries:    Recent Labs Lab 01/13/15 0100  NA 141  K 3.9  CL 103  CO2 28  GLUCOSE 160*  BUN 22  CREATININE 1.06  CALCIUM 9.1  AST 25  ALT 38  ALKPHOS 103  BILITOT 0.7    GFR Estimated Creatinine Clearance: 49.4 mL/min (by C-G formula based on Cr of 1.06).  Liver Function Tests:  Recent Labs Lab 01/13/15 0100  AST 25  ALT 38  ALKPHOS 103  BILITOT 0.7  PROT 6.7  ALBUMIN 3.8    Recent Labs Lab 01/13/15 0100  LIPASE 33   No results for input(s): AMMONIA in the last 168 hours.  Urine Studies     Component Value Date/Time   COLORURINE AMBER* 09/12/2014 0516   APPEARANCEUR CLOUDY* 09/12/2014 0516   LABSPEC 1.027 09/12/2014 0516   PHURINE 5.0 09/12/2014 0516   GLUCOSEU NEGATIVE 09/12/2014 0516   HGBUR NEGATIVE 09/12/2014 0516   BILIRUBINUR SMALL* 09/12/2014 0516   KETONESUR 15* 09/12/2014 0516   PROTEINUR 30* 09/12/2014 0516   UROBILINOGEN 1.0 09/12/2014 0516   NITRITE NEGATIVE 09/12/2014 0516   LEUKOCYTESUR NEGATIVE 09/12/2014 0516    Coagulation profile  Recent Labs Lab 01/13/15 0100  INR 1.05    Cardiac Enzymes:  Recent Labs Lab 01/13/15 0100 01/13/15 0504  CKTOTAL  --  61  TROPONINI 0.09* 0.07*   BNP: Invalid input(s): POCBNP CBG:  Recent Labs Lab 01/13/15 0812  GLUCAP 132*      Imaging Studies:  Ct Abdomen Pelvis Wo Contrast  01/13/2015   CLINICAL DATA:  Epigastric pain for several hr. Mild nausea. History of abdominal laparotomy to remove stromal tumor on 06/14. Oral chemotherapy.   EXAM: CT ABDOMEN AND PELVIS WITHOUT CONTRAST  TECHNIQUE: Multidetector CT imaging of the abdomen and pelvis was performed following the standard protocol without IV contrast.  COMPARISON:  10/16/2014  FINDINGS: Moderate size left pleural effusion with basilar atelectasis. Cardiac enlargement.  Evaluation of solid organs and vascular structures is limited due to lack of IV contrast material. There is mild abdominal ascites. This is mostly around the liver edge. Residual mesenteric edema and nodular mesenteric and peritoneal disease consistent with known metastatic disease. Peritoneal disease is improving since the previous study consistent with response to interval therapy. Soft tissue mass in the anterior abdomen at the level of the inferior liver edge measuring 2.3 x 3.5 cm. This likely represents residual disease. This mass appears smaller than on previous study. Multiple stones are demonstrated in the gallbladder. No gallbladder wall thickening or edema. The liver, spleen, pancreas, adrenal glands, kidneys, inferior vena cava, and retroperitoneal lymph nodes are unremarkable. Diffuse calcification of the abdominal aorta without aneurysm. Stomach, small bowel, and colon are decompressed. There appears to be a bowel anastomosis in the left lower quadrant. No free air in the abdomen.  Pelvis: Prostate gland is not enlarged. Bladder wall is not thickened. Small  amount of free fluid in the pelvis. Appendix is normal. No pelvic lymphadenopathy. Degenerative changes in the spine. No destructive bone lesions.  IMPRESSION: Peritoneal and omental metastatic disease with free fluid in the abdomen. These changes are improving since previous study. Cholelithiasis. Left pleural effusion with basilar atelectasis or consolidation.   Electronically Signed   By: Lucienne Capers M.D.   On: 01/13/2015 04:37    Assessment/Plan: 80 y.o.   Gastrointestinal stromal tumor of the small bowel, stage II (T3, N0, M0) Patient is  currently on [place]  CT of the abdomen and pelvis without contrast on 1/26 Showed peritoneal and omental metastatic disease with free fluid in the abdomen, improving since prior study. Moderate left pleural effusion was seen as well Continue Gleevec as prescribed  Acute upper Abdominal pain-improved today   Nausea  Left pleural effusion on the CT 01/13/2015  Mild Anemia   DVT prophylaxis On subcutaneous heparin  Full Code  Other medical issues as per admitting team   **Disclaimer: This note was dictated with voice recognition software. Similar sounding words can inadvertently be transcribed and this note may contain transcription errors which may not have been corrected upon publication of note.** Rondel Jumbo, PA-C 01/13/2015  9:20 AM [name] [name] was interviewed and examined. He was admitted yesterday with acute abdominal pain. The pain is improved today. The etiology of the pain is unclear. The pain may be related to dyspepsia. I doubt the pain is secondary to the gastrointestinal stromal tumor or Gleevec therapy. I reviewed the CT images from 01/13/2015. There has been marked improvement in the abdominal carcinomatosis compared to the CT from October 2015.  He has a moderate left pleural effusion. This is most likely related to cardiovascular disease. This can be evaluated further with a thoracentesis if he becomes symptomatic. The effusion could be in part related to [facility] therapy.  Recommendations: 1. Continue Gleevec 2. Outpatient follow-up at the [facility]. The scheduled office visit on 01/22/2015 will be moved out several weeks. 3. Please call oncology as needed

## 2015-01-13 NOTE — ED Notes (Signed)
Patient ambulated with son to restroom and back to stretcher.

## 2015-01-13 NOTE — Progress Notes (Signed)
Patient admitted after midnight.  Please see H&P.  Will heme test stools.  Had EGD in September 2015 by Dr. Michail Sermon.  Mildly elevated troponin.  Will get echo to compare. Clears Dr. [name] added to treatment team  Eulogio Bear DO

## 2015-01-13 NOTE — ED Provider Notes (Signed)
CSN: [id number]     Arrival date & time 01/12/15  2216 History   First MD Initiated Contact with Patient 01/13/15 0025     Chief Complaint  Patient presents with  . 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 Pain is epigastric and started in the evening. Pain is non radiating and is sharp. There is associated nausea and emesis. No diarrhea. Pt has no bloody stools. No UTI like sx. No hx of renal stones.   Patient is a 81 y.o. male presenting with chest pain. The history is provided by the patient.  Chest Pain Associated symptoms: abdominal pain and nausea   Associated symptoms: no cough and no shortness of breath     Past Medical History  Diagnosis Date  . Hyperlipidemia     TAKES zOCOR DAILY  . Abdominal mass 02/2013  . UTI (urinary tract infection)   . Hypertension     TAKES LOTREL,HCTZ,AND METOPROLOL DAILY  . NSTEMI (non-ST elevated myocardial infarction) 02/2013    "LIGHT: ONE MD SAID HE DID AND ONE SAID HE DIDN'T  . Pneumonia     MAR 2014  . Stromal tumor of digestive system   . CAD (coronary artery disease)   . Shortness of breath   . Acute respiratory failure with hypoxia 09/12/2014   Past Surgical History  Procedure Laterality Date  . Cyst excision  1962    wrist  . Cardiac catheterization  03-05-13  . Laparotomy N/A 06/04/2013    Procedure: EXPLORATORY LAPAROTOMY, OPEN RESECTION OF MESENTERIC AND INTESTINAL MASS;  Surgeon: [name], MD;  Location: [facility];  Service: General;  Laterality: N/A;  Small Bowel Resection  . Colonoscopy  09/08/2014    dr scooler  . Left heart catheterization with coronary angiogram N/A 03/05/2013    Procedure: LEFT HEART CATHETERIZATION WITH CORONARY ANGIOGRAM;  Surgeon: [name], MD;  Location: [facility] C[facility] LAB;  Service: Cardiovascular;  Laterality: N/A;   Family History  Problem Relation Age of Onset  .  Skin cancer Brother    History  Substance Use Topics  . Smoking status: Never Smoker   . Smokeless tobacco: Never Used  . Alcohol Use: No    Review of Systems  Constitutional: Negative for activity change and appetite change.  Respiratory: Negative for cough and shortness of breath.   Cardiovascular: Positive for chest pain.  Gastrointestinal: Positive for nausea and abdominal pain.  Genitourinary: Negative for dysuria.      Allergies  Review of patient's allergies indicates no known allergies.  Home Medications   Prior to Admission medications   Medication Sig Start Date End Date Taking? Authorizing Provider  benazepril (LOTENSIN) 20 MG tablet Take 20 mg by mouth daily. 04/08/14  Yes Historical Provider, MD  hydrochlorothiazide (HYDRODIURIL) 25 MG tablet Take 25 mg by mouth daily.   Yes Historical Provider, MD  imatinib (GLEEVEC) 400 MG tablet Take 1 tablet (400 mg total) by mouth daily. Take with meals and large glass of water.Caution:Chemotherapy. 11/26/14  Yes Ladell Pier, MD  metoprolol tartrate (LOPRESSOR) 25 MG tablet Take 1 tablet (25 mg total) by mouth 2 (two) times daily. 03/06/13  Yes Costin Karlyne Greenspan, MD  ranitidine (ZANTAC) 150 MG capsule Take 1 capsule (150 mg total) by mouth 2 (two) times daily. 01/12/15  Yes Melony Overly, MD  simvastatin (ZOCOR) 20 MG tablet Take 20 mg by mouth every evening.  Yes Historical Provider, MD  sucralfate (CARAFATE) 1 GM/10ML suspension Take 10 mLs (1 g total) by mouth 3 (three) times daily with meals. 01/12/15  Yes Melony Overly, MD  esomeprazole (NEXIUM) 20 MG capsule Take 1 capsule (20 mg total) by mouth daily at 12 noon. Patient not taking: Reported on 01/12/2015 12/26/14   Owens Shark, NP   BP 163/65 mmHg  Pulse 62  Temp(Src) 98.9 F (37.2 C) (Oral)  Resp 20  SpO2 93% Physical Exam  Constitutional: He is oriented to person, place, and time. He appears well-developed.  HENT:  Head: Normocephalic and atraumatic.  Eyes:  Conjunctivae and EOM are normal. Pupils are equal, round, and reactive to light.  Neck: Normal range of motion. Neck supple.  Cardiovascular: Normal rate and regular rhythm.   Pulmonary/Chest: Effort normal and breath sounds normal.  Abdominal: Soft. Bowel sounds are normal. He exhibits no distension. There is tenderness. There is no rebound and no guarding.  Epigastric tenderness  Neurological: He is alert and oriented to person, place, and time.  Skin: Skin is warm.  Nursing note and vitals reviewed.   ED Course  Procedures (including critical care time) Labs Review Labs Reviewed  CBC WITH DIFFERENTIAL/PLATELET - Abnormal; Notable for the following:    RBC 3.53 (*)    Hemoglobin 12.1 (*)    HCT 35.6 (*)    MCV 100.8 (*)    MCH 34.3 (*)    Neutrophils Relative % 85 (*)    Lymphocytes Relative 7 (*)    Lymphs Abs 0.5 (*)    All other components within normal limits  COMPREHENSIVE METABOLIC PANEL - Abnormal; Notable for the following:    Glucose, Bld 160 (*)    GFR calc non Af Amer 63 (*)    GFR calc Af Amer 73 (*)    All other components within normal limits  TROPONIN I - Abnormal; Notable for the following:    Troponin I 0.09 (*)    All other components within normal limits  TROPONIN I - Abnormal; Notable for the following:    Troponin I 0.07 (*)    All other components within normal limits  PROTIME-INR  LIPASE, BLOOD    Imaging Review Ct Abdomen Pelvis Wo Contrast  01/13/2015   CLINICAL DATA:  Epigastric pain for several hr. Mild nausea. History of abdominal laparotomy to remove stromal tumor on 06/14. Oral chemotherapy.  EXAM: CT ABDOMEN AND PELVIS WITHOUT CONTRAST  TECHNIQUE: Multidetector CT imaging of the abdomen and pelvis was performed following the standard protocol without IV contrast.  COMPARISON:  10/16/2014  FINDINGS: Moderate size left pleural effusion with basilar atelectasis. Cardiac enlargement.  Evaluation of solid organs and vascular structures is limited  due to lack of IV contrast material. There is mild abdominal ascites. This is mostly around the liver edge. Residual mesenteric edema and nodular mesenteric and peritoneal disease consistent with known metastatic disease. Peritoneal disease is improving since the previous study consistent with response to interval therapy. Soft tissue mass in the anterior abdomen at the level of the inferior liver edge measuring 2.3 x 3.5 cm. This likely represents residual disease. This mass appears smaller than on previous study. Multiple stones are demonstrated in the gallbladder. No gallbladder wall thickening or edema. The liver, spleen, pancreas, adrenal glands, kidneys, inferior vena cava, and retroperitoneal lymph nodes are unremarkable. Diffuse calcification of the abdominal aorta without aneurysm. Stomach, small bowel, and colon are decompressed. There appears to be a bowel anastomosis in the  left lower quadrant. No free air in the abdomen.  Pelvis: Prostate gland is not enlarged. Bladder wall is not thickened. Small amount of free fluid in the pelvis. Appendix is normal. No pelvic lymphadenopathy. Degenerative changes in the spine. No destructive bone lesions.  IMPRESSION: Peritoneal and omental metastatic disease with free fluid in the abdomen. These changes are improving since previous study. Cholelithiasis. Left pleural effusion with basilar atelectasis or consolidation.   Electronically Signed   By: Lucienne Capers M.D.   On: 01/13/2015 04:37     EKG Interpretation   Date/Time:  Monday January 12 2015 22:26:04 EST Ventricular Rate:  61 PR Interval:  144 QRS Duration: 88 QT Interval:  454 QTC Calculation: 457 R Axis:   65 Text Interpretation:  Sinus arrhythmia Anteroseptal infarct, age  indeterminate No significant change since last tracing Confirmed by [facility]   MD, Hatch [phone]) on 01/12/2015 10:56:23 PM      MDM   Final diagnoses:  Epigastric pain  GIST (gastrointestinal stromal tumor) of  small bowel, malignant    Pt comes in with epigastric abd pain. Pain is severe. Hx of GIST. PT has no cardiac hx, although, he has had NSTEMI in the past. With the epigastric abd pain - the trop is slightly elevated. It is lower than the NSTEMI. EKG is reassuring. CT done - and there is no acute findings. Pt has some ascites and peritoneal mass, unchanged. In the past CT non contrast done, and we repeated that study now. Pt on oral chemo - and has no SIRS critieria.  Post CT reassessed. Pain not completely resolved, and given how severe the pain got, he would prefer optimal relieft before he is discharged. Will admit for pain control.    Varney Biles, MD 01/13/15 [phone]

## 2015-01-14 ENCOUNTER — Other Ambulatory Visit: Payer: Self-pay | Admitting: *Deleted

## 2015-01-14 ENCOUNTER — Telehealth: Payer: Self-pay | Admitting: Oncology

## 2015-01-14 DIAGNOSIS — R1013 Epigastric pain: Secondary | ICD-10-CM | POA: Diagnosis not present

## 2015-01-14 DIAGNOSIS — I5033 Acute on chronic diastolic (congestive) heart failure: Secondary | ICD-10-CM | POA: Diagnosis not present

## 2015-01-14 DIAGNOSIS — C179 Malignant neoplasm of small intestine, unspecified: Secondary | ICD-10-CM | POA: Diagnosis not present

## 2015-01-14 DIAGNOSIS — I10 Essential (primary) hypertension: Secondary | ICD-10-CM | POA: Diagnosis not present

## 2015-01-14 DIAGNOSIS — E785 Hyperlipidemia, unspecified: Secondary | ICD-10-CM

## 2015-01-14 DIAGNOSIS — I2510 Atherosclerotic heart disease of native coronary artery without angina pectoris: Secondary | ICD-10-CM

## 2015-01-14 DIAGNOSIS — R109 Unspecified abdominal pain: Secondary | ICD-10-CM

## 2015-01-14 DIAGNOSIS — R7989 Other specified abnormal findings of blood chemistry: Secondary | ICD-10-CM | POA: Diagnosis not present

## 2015-01-14 DIAGNOSIS — C762 Malignant neoplasm of abdomen: Secondary | ICD-10-CM | POA: Diagnosis not present

## 2015-01-14 LAB — BASIC METABOLIC PANEL
Anion gap: 6 (ref 5–15)
BUN: 15 mg/dL (ref 6–23)
CALCIUM: 8.2 mg/dL — AB (ref 8.4–10.5)
CO2: 29 mmol/L (ref 19–32)
CREATININE: 0.94 mg/dL (ref 0.50–1.35)
Chloride: 103 mmol/L (ref 96–112)
GFR calc Af Amer: 87 mL/min — ABNORMAL LOW (ref >90)
GFR calc non Af Amer: 75 mL/min — ABNORMAL LOW (ref >90)
Glucose, Bld: 116 mg/dL — ABNORMAL HIGH (ref 70–99)
Potassium: 3.3 mmol/L — ABNORMAL LOW (ref 3.5–5.1)
SODIUM: 138 mmol/L (ref 135–145)

## 2015-01-14 LAB — CBC
HEMATOCRIT: 34.2 % — AB (ref 39.0–52.0)
HEMOGLOBIN: 11.5 g/dL — AB (ref 13.0–17.0)
MCH: 33.7 pg (ref 26.0–34.0)
MCHC: 33.6 g/dL (ref 30.0–36.0)
MCV: 100.3 fL — ABNORMAL HIGH (ref 78.0–100.0)
Platelets: 169 10*3/uL (ref 150–400)
RBC: 3.41 MIL/uL — AB (ref 4.22–5.81)
RDW: 13.6 % (ref 11.5–15.5)
WBC: 7.7 10*3/uL (ref 4.0–10.5)

## 2015-01-14 LAB — TROPONIN I
Troponin I: 0.05 ng/mL — ABNORMAL HIGH (ref <0.031)
Troponin I: 0.04 ng/mL — ABNORMAL HIGH (ref <0.031)

## 2015-01-14 LAB — GLUCOSE, CAPILLARY: Glucose-Capillary: 108 mg/dL — ABNORMAL HIGH (ref 70–99)

## 2015-01-14 MED ORDER — POTASSIUM CHLORIDE CRYS ER 20 MEQ PO TBCR
40.0000 meq | EXTENDED_RELEASE_TABLET | Freq: Once | ORAL | Status: AC
  Administered 2015-01-14: 40 meq via ORAL
  Filled 2015-01-14: qty 2

## 2015-01-14 MED ORDER — ISOSORBIDE MONONITRATE ER 30 MG PO TB24
30.0000 mg | ORAL_TABLET | Freq: Every day | ORAL | Status: DC
  Administered 2015-01-14 – 2015-01-15 (×2): 30 mg via ORAL
  Filled 2015-01-14 (×2): qty 1

## 2015-01-14 MED ORDER — METOPROLOL TARTRATE 25 MG PO TABS
25.0000 mg | ORAL_TABLET | Freq: Two times a day (BID) | ORAL | Status: DC
  Administered 2015-01-14 – 2015-01-15 (×3): 25 mg via ORAL
  Filled 2015-01-14 (×3): qty 1

## 2015-01-14 NOTE — Consult Note (Addendum)
Admit date: 01/12/2015 Referring Physician  Dr. Darrick Meigs Primary Physician  Dr. Maceo Pro Primary Cardiologist  Dr. Martinique Reason for Consultation  Elevated troponin  HPI: [name] is an 81 y/o male with H/o CAD s/p NSTEMI in March of 2014. Found to have a large abdominal mass at that time with non diagnostic pathology (possible complex hematoma). He was cathed and had an occluded LAD with excellent right to left collaterals. EF was 50%. LAD appeared suitable for PCI but given the possible abdominal hematoma,medical therapy was instituted. His other issues include HTN, HLD and anemia. 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   In 8/14: Myoview Low risk stress nuclear study Distal anterior wall apical and septal ischemia. Low risk scan only because of known LAD occlusion with collaterals. LV Ejection Fraction: 49%. LV Wall Motion: Diffuse hypokinesis with mild reduction in EF  On 09/12/2014 he developed acute respiratory distress. Brought to ER. On arrival the patient was alert on BiPAP. He had gotten Solu-Medrol, magnesium, DuoNeb in route. He denied any chest pain. His oxygen saturation was in the 50s and he was intubated emergently. CXR with CHF. ECG diffuse ST depression with ST elevation in AVR. Trop 2.1  It was felt that he had developed volume overload in the setting of prep for colonoscopy that progressed to acute pulmonary edema and respiratory failure and the NSTEMI was actually demand ischemia from respiratory failure in the setting of known chronically occluded LAD with collateral flow.  He was aggressively diuresed.  His hospital course was complicated by acute hemorraghic shock secondary to a spontaneous retroperitoneal hematoma, a NSTEMI secondary to demand ischemia, and acute renal failure This all eventually resolved. Echo on 09/12/14 showed an EF of 50%. He did have elevated pulmonary pressures but that was in the setting of acute  pulmonary edema.   He was subsequently diagnosed with abdominal carcinomatosis and was admitted yesterday with complaints of abdominal pain.  The pain started in the epigastric region 9/10 with no radiation but mild nausea.  He was seen initially at Urgent Care and discharged hom on Zantac and Sucralfate but got no improvement and came to the ER.  He denied any SOB, DOE, LE edema on presentation.  In ER troponin was noted to be mildly elevated at 0.07.  CT of the abdomen showed peritoneal and omental metastatic disease with free fluid in the abdomen.  Apparently these changes have improved since last scan.  He also has cholelithiasis and left pleural effusion.  He denies any chest pain but there was concern that epigastric pain may be related to his underlying CAD, Cardiology is now asked to consult.  He has not been on ASA due to intra abdominal hemorrhage.  He has been bradycardic and beta blocker was stopped.      PMH:   Past Medical History  Diagnosis Date  . Hyperlipidemia     TAKES zOCOR DAILY  . Abdominal mass 02/2013  . UTI (urinary tract infection)   . Hypertension     TAKES LOTREL,HCTZ,AND METOPROLOL DAILY  . NSTEMI (non-ST elevated myocardial infarction) 02/2013    "LIGHT: ONE MD SAID HE DID AND ONE SAID HE DIDN'T  . Pneumonia     MAR 2014  . Stromal tumor of digestive system   . CAD (coronary artery disease)   . Shortness of breath   . Acute respiratory failure with hypoxia 09/12/2014     PSH:  Past Surgical History  Procedure Laterality Date  . Cyst excision  1962    wrist  . Cardiac catheterization  03-05-13  . Laparotomy N/A 06/04/2013    Procedure: EXPLORATORY LAPAROTOMY, OPEN RESECTION OF MESENTERIC AND INTESTINAL MASS;  Surgeon: [name], MD;  Location: [facility];  Service: General;  Laterality: N/A;  Small Bowel Resection  . Colonoscopy  09/08/2014    dr scooler  . Left heart catheterization with coronary angiogram N/A 03/05/2013    Procedure: LEFT HEART  CATHETERIZATION WITH CORONARY ANGIOGRAM;  Surgeon: [name], MD;  Location: [facility] CATH LAB;  Service: Cardiovascular;  Laterality: N/A;    Allergies:  Review of patient's allergies indicates no known allergies. Prior to Admit Meds:   Prescriptions prior to admission  Medication Sig Dispense Refill Last Dose  . benazepril (LOTENSIN) 20 MG tablet Take 20 mg by mouth daily.   01/12/2015 at Unknown time  . hydrochlorothiazide (HYDRODIURIL) 25 MG tablet Take 25 mg by mouth daily.   01/12/2015 at Unknown time  . imatinib (GLEEVEC) 400 MG tablet Take 1 tablet (400 mg total) by mouth daily. Take with meals and large glass of water.Caution:Chemotherapy. 30 tablet 2 01/11/2015 at Unknown time  . metoprolol tartrate (LOPRESSOR) 25 MG tablet Take 1 tablet (25 mg total) by mouth 2 (two) times daily. 60 tablet 2 01/12/2015 at 0830  . ranitidine (ZANTAC) 150 MG capsule Take 1 capsule (150 mg total) by mouth 2 (two) times daily. 60 capsule 0 01/12/2015 at Unknown time  . simvastatin (ZOCOR) 20 MG tablet Take 20 mg by mouth every evening.   01/11/2015 at Unknown time  . sucralfate (CARAFATE) 1 GM/10ML suspension Take 10 mLs (1 g total) by mouth 3 (three) times daily with meals. 420 mL 0 01/12/2015 at Unknown time  . esomeprazole (NEXIUM) 20 MG capsule Take 1 capsule (20 mg total) by mouth daily at 12 noon. (Patient not taking: Reported on 01/12/2015) 30 capsule 0    Fam HX:    Family History  Problem Relation Age of Onset  . Skin cancer Brother    Social HX:    History   Social History  . Marital Status: Married    Spouse Name: [name]    Number of Children: 2  . Years of Education: N/A   Occupational History  . Retired     Writer   Social History Main Topics  . Smoking status: Never Smoker   . Smokeless tobacco: Never Used  . Alcohol Use: No  . Drug Use: No  . Sexual Activity: No   Other Topics Concern  . Not on file   Social History Narrative   Married, wife [name]   Retired Information systems manager for 36 years   Independent in [place]   #2 grown sons     ROS:  All 11 ROS were addressed and are negative except what is stated in the HPI  Physical Exam: Blood pressure 147/59, pulse 71, temperature 98.1 F (36.7 C), temperature source Oral, resp. rate 20, height 5\' 7"  (1.702 m), weight 152 lb (68.947 kg), SpO2 99 %.    General: Well developed, well nourished, in no acute distress Head: Eyes PERRLA, No xanthomas.   Normal cephalic and atramatic  Lungs:   Clear bilaterally to auscultation and percussion. Heart:   HRRR S1 S2 Pulses are 2+ & equal.            No carotid bruit. No JVD.  No abdominal  bruits. No femoral bruits. Abdomen: Bowel sounds are positive, abdomen soft and non-tender without masses  Extremities:   No clubbing, cyanosis or edema.  DP +1 Neuro: Alert and oriented X 3. Psych:  Good affect, responds appropriately    Labs:   Lab Results  Component Value Date   WBC 7.7 01/14/2015   HGB 11.5* 01/14/2015   HCT 34.2* 01/14/2015   MCV 100.3* 01/14/2015   PLT 169 01/14/2015    Recent Labs Lab 01/13/15 0100 01/14/15 0445  NA 141 138  K 3.9 3.3*  CL 103 103  CO2 28 29  BUN 22 15  CREATININE 1.06 0.94  CALCIUM 9.1 8.2*  PROT 6.7  --   BILITOT 0.7  --   ALKPHOS 103  --   ALT 38  --   AST 25  --   GLUCOSE 160* 116*   No results found for: PTT Lab Results  Component Value Date   INR 1.05 01/13/2015   INR 1.07 10/21/2014   INR 1.09 10/16/2014   Lab Results  Component Value Date   CKTOTAL 61 01/13/2015   TROPONINI 0.04* 01/14/2015    No results found for: CHOL No results found for: HDL No results found for: [facility] Lab Results  Component Value Date   TRIG 250* 09/18/2014   TRIG 123 09/15/2014   TRIG 102 09/12/2014   No results found for: CHOLHDL No results found for: LDLDIRECT    Radiology:  Ct Abdomen Pelvis Wo Contrast  01/13/2015   CLINICAL DATA:  Epigastric pain for several hr. Mild nausea. History of abdominal  laparotomy to remove stromal tumor on 06/14. Oral chemotherapy.  EXAM: CT ABDOMEN AND PELVIS WITHOUT CONTRAST  TECHNIQUE: Multidetector CT imaging of the abdomen and pelvis was performed following the standard protocol without IV contrast.  COMPARISON:  10/16/2014  FINDINGS: Moderate size left pleural effusion with basilar atelectasis. Cardiac enlargement.  Evaluation of solid organs and vascular structures is limited due to lack of IV contrast material. There is mild abdominal ascites. This is mostly around the liver edge. Residual mesenteric edema and nodular mesenteric and peritoneal disease consistent with known metastatic disease. Peritoneal disease is improving since the previous study consistent with response to interval therapy. Soft tissue mass in the anterior abdomen at the level of the inferior liver edge measuring 2.3 x 3.5 cm. This likely represents residual disease. This mass appears smaller than on previous study. Multiple stones are demonstrated in the gallbladder. No gallbladder wall thickening or edema. The liver, spleen, pancreas, adrenal glands, kidneys, inferior vena cava, and retroperitoneal lymph nodes are unremarkable. Diffuse calcification of the abdominal aorta without aneurysm. Stomach, small bowel, and colon are decompressed. There appears to be a bowel anastomosis in the left lower quadrant. No free air in the abdomen.  Pelvis: Prostate gland is not enlarged. Bladder wall is not thickened. Small amount of free fluid in the pelvis. Appendix is normal. No pelvic lymphadenopathy. Degenerative changes in the spine. No destructive bone lesions.  IMPRESSION: Peritoneal and omental metastatic disease with free fluid in the abdomen. These changes are improving since previous study. Cholelithiasis. Left pleural effusion with basilar atelectasis or consolidation.   Electronically Signed   By: Lucienne Capers M.D.   On: 01/13/2015 04:37    EKG:  NSR with anteroseptal infarct but no acute ST  changes of ischemia.  ASSESSMENT/PLAN:  1.  Elevated troponin with very flat trend.  I do not think this represents an acute coronary syndrome.  This is most  likely demand ischemia from his underlying abdominal process.  He has a known LAD occlusion but with collateral flow from right to left at cath. Nuclear stress test was low risk with mild distal anteroapical and septal ischemia and he has been on medical therapy since then.   He has been off  ASA due to recent spontaneous intra abdominal hemorrhage and therefore would be leary about PCI of LAD since he would have to be on DAPT for at least 30 days with BMS.  I think that the epigastric discomfort is more related to his current intra abdominal process.  Continue current medical therapy with statins.  BB stopped due to bradycardia but I only see one reported HR in the 30's and HR has been fine over the past 24 hours so would consider restarting metoprolol and place on tele to monitor HR.   Will add Imdur 30mg  daily for antianginal therapy.   2.  ASCAD with known CTO of the proximal LAD with left to left collaterals from a second diagona (which has some disease in it) and right to left collaterals from the RCA.  Currently not on platelet inhibitors due to recent intra abdominal hemorrhage 3.  Abdominal carcinomatosis s/p surgery 2014 now with abdominal pain 4.  HTN - controlled 5. Hypokalemia - replete per [place], MD  01/14/2015  11:54 AM

## 2015-01-14 NOTE — Progress Notes (Signed)
[facility] PROGRESS NOTE  [name] [name] MRN:[id number] DOB: 07/26/1929 DOA: 01/12/2015 PCP: FRIED, ROBERT L, MD  Assessment/Plan: 1. Epigastric/chest pain- pain has now resolved, patient had mild elevation of troponin, will consult cardiology as patient has a history of non-STEMI in March 2014. At that time card a cath confirmed LAD occlusion. Continue Carafate 1 g 3 times a day. 2. Anemia- likely due to the myelodysplasia and questionable GI bleed. Patient had bone marrow biopsy in November 2015 which revealed hypercellular marrow with abundant iron stores. Will obtain stool for occult blood. 3. 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 Sherrill. Status post primary resection on 06/04/2013. C2 down showed extensive carcinomatosis. Continue Gleevec. 4. Hypertension- continue HCTZ 25 mg daily, restart Lopressor 25 twice a day as per cardiogenic recommendation. 5. CAD- continue metoprolol, Imdur 30 mg daily, simvastatin 20 minute grams daily.  Code Status: Full code Family Communication: *Discussed with patient's son on phone Disposition Plan: Home when stable   Consultants:  Cardiology  Procedures:  *None  Antibiotics:  None  HPI/Subjective: 81 y.o. male with past medical history of hypertension, hyperlipidemia, coronary artery disease, diastolic congestive heart failure, abdominal carcinomatosis, who presents with abdominal pain. Patient came with epigastric pain, which resolved by the time patient came to the ED. Patient was recently started on Zantac and sucralfate by the urgent care center. Workup in the ED showed negative lipase, slight elevation of troponin 0.09 -0.07.  Objective: Filed Vitals:   01/14/15 1436  BP: 117/54  Pulse: 73  Temp: 98.5 F (36.9 C)  Resp: 19    Intake/Output Summary (Last 24 hours) at 01/14/15 1717 Last data filed at 01/14/15 1438  Gross per 24 hour  Intake   1510 ml  Output    875 ml  Net    635  ml   Filed Weights   01/13/15 0755 01/14/15 0515  Weight: 68.312 kg (150 lb 9.6 oz) 68.947 kg (152 lb)    Exam:  Physical Exam: Eyes: No icterus, extraocular muscles intact  Lungs: Normal respiratory effort, bilateral clear to auscultation, no crackles or wheezes.  Heart: Regular rate and rhythm, S1 and S2 normal, no murmurs, rubs auscultated Abdomen: BS normoactive,soft,nondistended,non-tender to palpation,no organomegaly Extremities: No pretibial edema, no erythema, no cyanosis, no clubbing Neuro : Alert and oriented to time, place and person, No focal deficits    Data Reviewed: Basic Metabolic Panel:  Recent Labs Lab 01/13/15 0100 01/14/15 0445  NA 141 138  K 3.9 3.3*  CL 103 103  CO2 28 29  GLUCOSE 160* 116*  BUN 22 15  CREATININE 1.06 0.94  CALCIUM 9.1 8.2*   Liver Function Tests:  Recent Labs Lab 01/13/15 0100  AST 25  ALT 38  ALKPHOS 103  BILITOT 0.7  PROT 6.7  ALBUMIN 3.8    Recent Labs Lab 01/13/15 0100  LIPASE 33   No results for input(s): AMMONIA in the last 168 hours. CBC:  Recent Labs Lab 01/13/15 0100 01/14/15 0445  WBC 6.6 7.7  NEUTROABS 5.5  --   HGB 12.1* 11.5*  HCT 35.6* 34.2*  MCV 100.8* 100.3*  PLT 197 169   Cardiac Enzymes:  Recent Labs Lab 01/13/15 0504 01/13/15 1150 01/13/15 1829 01/14/15 0029 01/14/15 1035  CKTOTAL 61  --   --   --   --   TROPONINI 0.07* 0.06* 0.05* 0.05* 0.04*   BNP (last 3 results)  Recent Labs  09/12/14 0445  PROBNP 5726.0*   CBG:    Recent Labs Lab 01/13/15 0812 01/14/15 0729  GLUCAP 132* 108*    No results found for this or any previous visit (from the past 240 hour(s)).   Studies: Ct Abdomen Pelvis Wo Contrast  01/13/2015   CLINICAL DATA:  Epigastric pain for several hr. Mild nausea. History of abdominal laparotomy to remove stromal tumor on 06/14. Oral chemotherapy.  EXAM: CT ABDOMEN AND PELVIS WITHOUT CONTRAST  TECHNIQUE: Multidetector CT imaging of the abdomen and pelvis  was performed following the standard protocol without IV contrast.  COMPARISON:  10/16/2014  FINDINGS: Moderate size left pleural effusion with basilar atelectasis. Cardiac enlargement.  Evaluation of solid organs and vascular structures is limited due to lack of IV contrast material. There is mild abdominal ascites. This is mostly around the liver edge. Residual mesenteric edema and nodular mesenteric and peritoneal disease consistent with known metastatic disease. Peritoneal disease is improving since the previous study consistent with response to interval therapy. Soft tissue mass in the anterior abdomen at the level of the inferior liver edge measuring 2.3 x 3.5 cm. This likely represents residual disease. This mass appears smaller than on previous study. Multiple stones are demonstrated in the gallbladder. No gallbladder wall thickening or edema. The liver, spleen, pancreas, adrenal glands, kidneys, inferior vena cava, and retroperitoneal lymph nodes are unremarkable. Diffuse calcification of the abdominal aorta without aneurysm. Stomach, small bowel, and colon are decompressed. There appears to be a bowel anastomosis in the left lower quadrant. No free air in the abdomen.  Pelvis: Prostate gland is not enlarged. Bladder wall is not thickened. Small amount of free fluid in the pelvis. Appendix is normal. No pelvic lymphadenopathy. Degenerative changes in the spine. No destructive bone lesions.  IMPRESSION: Peritoneal and omental metastatic disease with free fluid in the abdomen. These changes are improving since previous study. Cholelithiasis. Left pleural effusion with basilar atelectasis or consolidation.   Electronically Signed   By: Lucienne Capers M.D.   On: 01/13/2015 04:37    Scheduled Meds: . benazepril  20 mg Oral Daily  . heparin  5,000 Units Subcutaneous 3 times per day  . hydrochlorothiazide  25 mg Oral Daily  . imatinib  400 mg Oral Q lunch  . isosorbide mononitrate  30 mg Oral Daily  .  metoprolol tartrate  25 mg Oral BID  . pantoprazole (PROTONIX) IV  40 mg Intravenous Q24H  . simvastatin  20 mg Oral QPM  . sodium chloride  3 mL Intravenous Q12H  . sucralfate  1 g Oral TID WC   Continuous Infusions:   Principal Problem:   Abdominal pain Active Problems:   GIST (gastrointestinal stromal tumor) of small bowel, malignant   Hyperlipidemia   CAD (coronary artery disease)   Hypertension   Diastolic CHF, acute on chronic   Elevated troponin I level   Abdominal carcinomatosis   Epigastric pain    Time spent: 25 min    [facility] Pager [phone]. If 7PM-7AM, please contact night-coverage at [email or web address], password [facility] 01/14/2015, 5:17 PM  LOS: 2 days

## 2015-01-14 NOTE — Progress Notes (Signed)
Pt declined HHPT at present time. Pt will discharge with his wife who was at bedside.  There are no discharge needs at present time.

## 2015-01-14 NOTE — Progress Notes (Signed)
UR completed 

## 2015-01-14 NOTE — Evaluation (Signed)
Physical Therapy One Time Evaluation Patient Details Name: [name] [name] MRN: [id number] DOB: 08/13/1929 Today's Date: 01/14/2015   History of Present Illness  [name] [name] is an 81 y.o. male with past medical history of hypertension, hyperlipidemia, coronary artery disease, diastolic congestive heart failure, abdominal carcinomatosis, whom was admitted with  abdominal pain. Abdominal pain: It is most likely due to abdominal carcinomatosis, which has not changed significantly on CT abdomen/pelvis.   Clinical Impression  Patient evaluated by Physical Therapy with no further acute PT needs identified. All education has been completed and the patient has no further questions.  Pt mobilizing well and no follow-up Physical Therapy or equipment needs identified. PT is signing off. Thank you for this referral.     Follow Up Recommendations No PT follow up    Equipment Recommendations  None recommended by PT    Recommendations for Other Services       Precautions / Restrictions Precautions Precautions: Fall Restrictions Weight Bearing Restrictions: No      Mobility  Bed Mobility Overal bed mobility: Independent                Transfers Overall transfer level: Modified independent Equipment used: 1 person hand held assist Transfers: Sit to/from Stand Sit to Stand: Modified independent (Device/Increase time)         General transfer comment: Initial stand from EOB was w/ 1 hand held assist, "I haven't gotten up in a few days, been in this bed", however remaining transfers were all at Mod I level.  Ambulation/Gait Ambulation/Gait assistance: Supervision;Modified independent (Device/Increase time) Ambulation Distance (Feet): 400 Feet Assistive device: None Gait Pattern/deviations: WFL(Within Functional Limits)     General Gait Details: good pace, steady gait, HR around 109 bpm during ambulation  Stairs            Wheelchair Mobility    Modified Rankin (Stroke  Patients Only)       Balance Overall balance assessment: No apparent balance deficits (not formally assessed) (denies hx of falls)                                           Pertinent Vitals/Pain Pain Assessment: No/denies pain    Home Living Family/patient expects to be discharged to:: Private residence Living Arrangements: Spouse/significant other Available Help at Discharge: Family;Available 24 hours/day Type of Home: House Home Access: Stairs to enter Entrance Stairs-Rails: None Entrance Stairs-Number of Steps: 1 STE Home Layout: One level Home Equipment: Grab bars - tub/shower      Prior Function Level of Independence: Independent         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     Hand Dominance   Dominant Hand: Right    Extremity/Trunk Assessment   Upper Extremity Assessment: Overall WFL for tasks assessed           Lower Extremity Assessment: Overall WFL for tasks assessed         Communication   Communication: No difficulties  Cognition Arousal/Alertness: Awake/alert Behavior During Therapy: WFL for tasks assessed/performed Overall Cognitive Status: Within Functional Limits for tasks assessed                      General Comments      Exercises        Assessment/Plan    PT Assessment Patent does not need any further  PT services  PT Diagnosis     PT Problem List    PT Treatment Interventions     PT Goals (Current goals can be found in the Care Plan section) Acute Rehab PT Goals Patient Stated Goal: I want to go home today PT Goal Formulation: All assessment and education complete, DC therapy    Frequency     Barriers to discharge        Co-evaluation               End of Session   Activity Tolerance: Patient tolerated treatment well Patient left: in chair;with family/visitor present;with call bell/phone within reach      Functional Assessment Tool Used: clinical  judgement Functional Limitation: Mobility: Walking and moving around Mobility: Walking and Moving Around Current Status [phone]): 0 percent impaired, limited or restricted Mobility: Walking and Moving Around Goal Status [phone]): 0 percent impaired, limited or restricted Mobility: Walking and Moving Around Discharge Status [phone]): 0 percent impaired, limited or restricted    Time: 0853-0901 PT Time Calculation (min) (ACUTE ONLY): 8 min   Charges:   PT Evaluation $Initial PT Evaluation Tier I: 1 Procedure     PT G Codes:   PT G-Codes **NOT FOR INPATIENT CLASS** Functional Assessment Tool Used: clinical judgement Functional Limitation: Mobility: Walking and moving around Mobility: Walking and Moving Around Current Status (M8413): 0 percent impaired, limited or restricted Mobility: Walking and Moving Around Goal Status (K4401): 0 percent impaired, limited or restricted Mobility: Walking and Moving Around Discharge Status (U2725): 0 percent impaired, limited or restricted    Sergio Zawislak,KATHrine E 01/14/2015, 11:40 AM Carmelia Bake, PT, DPT 01/14/2015 Pager: [phone]

## 2015-01-14 NOTE — Evaluation (Signed)
Occupational Therapy Evaluation Patient Details Name: Quashon Jesus MRN: [id number] DOB: [birth year] Today's Date: 01/14/2015    History of Present Illness Lora Glomski is an 81 y.o. male with past medical history of hypertension, hyperlipidemia, coronary artery disease, diastolic congestive heart failure, abdominal carcinomatosis, whom was admitted with  abdominal pain. Abdominal pain: It is most likely due to abdominal carcinomatosis, which has not changed significantly on CT abdomen/pelvis.    Clinical Impression   Pt is admitted as above and currently presents as Mod I for ADL's and functional mobility. Pt/spouse report no further acute OT needs at this time and state that he will have any necessary A at d/c. Will sign off acute OT.    Follow Up Recommendations  No OT follow up;Supervision - Intermittent    Equipment Recommendations  None recommended by OT    Recommendations for Other Services       Precautions / Restrictions Precautions Precautions: Fall Restrictions Weight Bearing Restrictions: No      Mobility Bed Mobility Overal bed mobility: Independent                Transfers Overall transfer level: Needs assistance Equipment used: 1 person hand held assist Transfers: Sit to/from Stand Sit to Stand: Modified independent (Device/Increase time)         General transfer comment: Initial stand from EOB was w/ 1 hand held assist, "I haven't gotten up in a few days, been in this bed", however remaining transfers were all at Mod I level.    Balance Overall balance assessment: No apparent balance deficits (not formally assessed);Modified Independent                                          ADL Overall ADL's : Modified independent;At baseline                                       General ADL Comments: Pt seen for OT assessment to include functional mobility in room and bathroom as well as toilet transfer at Mod I level followed  by standing at sink for grooming and UB bathing. Pt ambulated back to recliner and transferred w/ Mod I and was able to don/doff socks in seated position. Pt and his wife report no further acute OT needs at this time and state that spouse/family can assist PRN at home.. Will sign off acute OT.     Vision  No change from baseline.                   Perception     Praxis      Pertinent Vitals/Pain Pain Assessment: No/denies pain     Hand Dominance Right   Extremity/Trunk Assessment Upper Extremity Assessment Upper Extremity Assessment: Overall WFL for tasks assessed   Lower Extremity Assessment Lower Extremity Assessment: Defer to PT evaluation;Overall WFL for tasks assessed       Communication Communication Communication: No difficulties   Cognition Arousal/Alertness: Awake/alert Behavior During Therapy: WFL for tasks assessed/performed Overall Cognitive Status: Within Functional Limits for tasks assessed                     General Comments       Exercises       Shoulder Instructions      Home  Living Family/patient expects to be discharged to:: Private residence Living Arrangements: Spouse/significant other Available Help at Discharge: Family;Available 24 hours/day Type of Home: House Home Access: Stairs to enter [employer] of Steps: 1 STE Entrance Stairs-Rails: None Home Layout: One level     Bathroom Shower/Tub: Walk-in shower;Door   [employer] Toilet: Handicapped height     Home Equipment: Grab bars - tub/shower          Prior Functioning/Environment Level of Independence: Independent             OT Diagnosis: Acute pain   OT Problem List:     OT Treatment/Interventions:      OT Goals(Current goals can be found in the care plan section) Acute Rehab OT Goals Patient Stated Goal: I want to go home today  OT Frequency:     Barriers to D/C:            Co-evaluation              End of Session     Activity Tolerance: Patient tolerated treatment well Patient left: in chair;with call bell/phone within reach;with chair alarm set   Time: [phone] OT Time Calculation (min): 24 min Charges:  OT General Charges $OT Visit: 1 Procedure OT Evaluation $Initial OT Evaluation Tier I: 1 Procedure OT Treatments $Self Care/Home Management : 8-22 mins (8 min) G-Codes: OT G-codes **NOT FOR INPATIENT CLASS** Functional Assessment Tool Used: Clinical Judgement Functional Limitation: Self care Self Care Current Status (Y1749): 0 percent impaired, limited or restricted Self Care Goal Status (S4967): 0 percent impaired, limited or restricted Self Care Discharge Status (R9163): 0 percent impaired, limited or restricted  Almyra Deforest, OTR/L 01/14/2015, 8:58 AM

## 2015-01-14 NOTE — Telephone Encounter (Signed)
Lft msg for pt confirming labs/ov per 01/26 POF, mailed sch to pt..... KJ °

## 2015-01-15 DIAGNOSIS — I2510 Atherosclerotic heart disease of native coronary artery without angina pectoris: Secondary | ICD-10-CM | POA: Diagnosis not present

## 2015-01-15 DIAGNOSIS — I248 Other forms of acute ischemic heart disease: Secondary | ICD-10-CM | POA: Diagnosis not present

## 2015-01-15 DIAGNOSIS — R109 Unspecified abdominal pain: Secondary | ICD-10-CM | POA: Diagnosis not present

## 2015-01-15 DIAGNOSIS — I10 Essential (primary) hypertension: Secondary | ICD-10-CM | POA: Diagnosis not present

## 2015-01-15 DIAGNOSIS — C762 Malignant neoplasm of abdomen: Secondary | ICD-10-CM | POA: Diagnosis not present

## 2015-01-15 DIAGNOSIS — R1013 Epigastric pain: Secondary | ICD-10-CM | POA: Diagnosis not present

## 2015-01-15 DIAGNOSIS — R7989 Other specified abnormal findings of blood chemistry: Secondary | ICD-10-CM | POA: Diagnosis not present

## 2015-01-15 DIAGNOSIS — C179 Malignant neoplasm of small intestine, unspecified: Secondary | ICD-10-CM | POA: Diagnosis not present

## 2015-01-15 LAB — BASIC METABOLIC PANEL
Anion gap: 7 (ref 5–15)
BUN: 19 mg/dL (ref 6–23)
CO2: 29 mmol/L (ref 19–32)
CREATININE: 1.07 mg/dL (ref 0.50–1.35)
Calcium: 8.3 mg/dL — ABNORMAL LOW (ref 8.4–10.5)
Chloride: 103 mmol/L (ref 96–112)
GFR calc Af Amer: 72 mL/min — ABNORMAL LOW (ref >90)
GFR, EST NON AFRICAN AMERICAN: 62 mL/min — AB (ref >90)
GLUCOSE: 100 mg/dL — AB (ref 70–99)
Potassium: 3.5 mmol/L (ref 3.5–5.1)
Sodium: 139 mmol/L (ref 135–145)

## 2015-01-15 LAB — CBC
HEMATOCRIT: 31.5 % — AB (ref 39.0–52.0)
Hemoglobin: 10.6 g/dL — ABNORMAL LOW (ref 13.0–17.0)
MCH: 34.1 pg — ABNORMAL HIGH (ref 26.0–34.0)
MCHC: 33.7 g/dL (ref 30.0–36.0)
MCV: 101.3 fL — ABNORMAL HIGH (ref 78.0–100.0)
Platelets: 182 10*3/uL (ref 150–400)
RBC: 3.11 MIL/uL — ABNORMAL LOW (ref 4.22–5.81)
RDW: 13.7 % (ref 11.5–15.5)
WBC: 5.7 10*3/uL (ref 4.0–10.5)

## 2015-01-15 LAB — GLUCOSE, CAPILLARY: Glucose-Capillary: 103 mg/dL — ABNORMAL HIGH (ref 70–99)

## 2015-01-15 MED ORDER — SUCRALFATE 1 GM/10ML PO SUSP: 1.0000 g | Freq: Three times a day (TID) | ORAL | Status: DC

## 2015-01-15 MED ORDER — ISOSORBIDE MONONITRATE ER 30 MG PO TB24: 30.0000 mg | ORAL_TABLET | Freq: Every day | ORAL | Status: DC

## 2015-01-15 NOTE — Progress Notes (Signed)
Cardiologist: Dr. Martinique  Subjective:  No CP currently. Mild episode of epigastric/chest discomfort last night, 5 min. Resolved.   Objective:  Vital Signs in the last 24 hours: Temp:  [98.1 F (36.7 C)-98.5 F (36.9 C)] 98.1 F (36.7 C) (01/28 0449) Pulse Rate:  [58-73] 58 (01/28 0449) Resp:  [18-19] 18 (01/28 0449) BP: (111-147)/(44-59) 111/44 mmHg (01/28 0449) SpO2:  [97 %-100 %] 97 % (01/28 0449) Weight:  [151 lb 6.4 oz (68.675 kg)] 151 lb 6.4 oz (68.675 kg) (01/28 0449)  Intake/Output from previous day: 01/27 0701 - 01/28 0700 In: 600 [P.O.:600] Out: 275 [Urine:275]   Physical Exam: General: Well developed, well nourished, in no acute distress. Head:  Normocephalic and atraumatic. Lungs: Clear to auscultation and percussion. Heart: Normal S1 and S2.  No murmur, rubs or gallops.  Abdomen: soft, non-tender, positive bowel sounds. Extremities: No clubbing or cyanosis. No edema. Neurologic: Alert and oriented x 3.    Lab Results:  Recent Labs  01/14/15 0445 01/15/15 0515  WBC 7.7 5.7  HGB 11.5* 10.6*  PLT 169 182    Recent Labs  01/14/15 0445 01/15/15 0515  NA 138 139  K 3.3* 3.5  CL 103 103  CO2 29 29  GLUCOSE 116* 100*  BUN 15 19  CREATININE 0.94 1.07    Recent Labs  01/14/15 0029 01/14/15 1035  TROPONINI 0.05* 0.04*   Hepatic Function Panel  Recent Labs  01/13/15 0100  PROT 6.7  ALBUMIN 3.8  AST 25  ALT 38  ALKPHOS 103  BILITOT 0.7       Telemetry: NSR, PAC's, no brady that is significant Personally viewed.   Scheduled Meds: . benazepril  20 mg Oral Daily  . heparin  5,000 Units Subcutaneous 3 times per day  . hydrochlorothiazide  25 mg Oral Daily  . imatinib  400 mg Oral Q lunch  . isosorbide mononitrate  30 mg Oral Daily  . metoprolol tartrate  25 mg Oral BID  . pantoprazole (PROTONIX) IV  40 mg Intravenous Q24H  . simvastatin  20 mg Oral QPM  . sodium chloride  3 mL Intravenous Q12H  . sucralfate  1 g Oral TID WC    Continuous Infusions:  PRN Meds:.HYDROmorphone (DILAUDID) injection, ondansetron **OR** ondansetron (ZOFRAN) IV, temazepam   Assessment/Plan:  Principal Problem:   Abdominal pain Active Problems:   GIST (gastrointestinal stromal tumor) of small bowel, malignant   Hyperlipidemia   CAD (coronary artery disease)   Hypertension   Diastolic CHF, acute on chronic   Elevated troponin I level   Abdominal carcinomatosis   Epigastric pain  1. Elevated troponin with very flat trend. Agree with Dr. Radford Pax, I do not think this represents an acute coronary syndrome. This is most likely demand ischemia from his underlying abdominal process. He has a known LAD occlusion but with collateral flow from right to left at cath. Nuclear stress test was low risk with mild distal anteroapical and septal ischemia and he has been on medical therapy since then. He has been off ASA due to recent spontaneous intra abdominal hemorrhage and therefore would be leary about PCI of LAD since he would have to be on DAPT for at least 30 days with BMS. I think that the epigastric discomfort is more related to his current intra abdominal process. Continue current medical therapy with statins. BB stopped due to bradycardia but I only see one reported HR in the 30's and HR has been fine over the past 48 hours. Restarted  metoprolol. Added Imdur 30mg  daily and low dose metoprolol for antianginal therapy.   2. ASCAD with known CTO of the proximal LAD with left to left collaterals from a second diagona (which has some disease in it) and right to left collaterals from the RCA. Currently not on platelet inhibitors due to recent intra abdominal hemorrhage. Be careful with Heparin Ovid for DVT proph.  3. Abdominal carcinomatosis s/p surgery 2014 now with abdominal pain  4. HTN - controlled  5. Hypokalemia - repleted per Dr. Darrick Meigs  Will sign off. OK to DC from cardiac perspective. Final decision per primary teams.     SKAINS, Mona 01/15/2015, 8:01 AM

## 2015-01-15 NOTE — Discharge Summary (Addendum)
Physician Discharge Summary  Neco Kling ACZ:[id number] DOB: 07-Jan-1929 DOA: 01/12/2015  PCP: Abigail Miyamoto, MD  Admit date: 01/12/2015 Discharge date: 01/15/2015  Time spent: *50 minutes  Recommendations for Outpatient Follow-up:  1. *Follow up PCP in 2 weeks  Discharge Diagnoses:  Principal Problem:   Abdominal pain Active Problems:   GIST (gastrointestinal stromal tumor) of small bowel, malignant   Hyperlipidemia   CAD (coronary artery disease)   Hypertension   Diastolic CHF, acute on chronic   Elevated troponin I level   Abdominal carcinomatosis   Epigastric pain   Discharge Condition: Stable  Diet recommendation: heart healthy diet  Filed Weights   01/13/15 0755 01/14/15 0515 01/15/15 0449  Weight: 68.312 kg (150 lb 9.6 oz) 68.947 kg (152 lb) 68.675 kg (151 lb 6.4 oz)    History of present illness:  81 y.o. male with past medical history of hypertension, hyperlipidemia, coronary artery disease, diastolic congestive heart failure, abdominal carcinomatosis, who presents with abdominal pain.  Patient reports that his abdominal pain started at about 4 PM. It is located in epigastric area, constant, 9 out of 10 in severity, nonradiating. It is not aggravated or alleviated by any known factors. Patient has mild nausea, but no vomiting, diarrhea.  Patient was evaluated in the urgent careat 6 PM, and was discharged on Ranitidine and Sucralfate. He took these two medications at home without significant improvement. Therefore he called nurse line and was advised to come to emergency room for further evaluation and treatment. He has not had blood in his stool. Patient denies fever, chills, headaches, cough, chest pain, SOB, diarrhea, constipation, dysuria, urgency, frequency, hematuria, skin rashes, or leg swelling.  Work up in the ED demonstrates negative lipase. Slightly elevated troponin 0.09-->0.07, EKG has occasional skipped beat. Afebrile. CT abdomen/pelvis showed peritoneal  and omental metastatic disease with free fluid in the abdomen. These changes are improving since previous study per radiologist. Cholelithiasis and left pleural effusion with basilar atelectasis or consolidation. Patient is admitted to inpatient for further evaluation and treatment.  Hospital Course:  1. Epigastric/chest pain- pain has now resolved, patient had mild elevation of troponin,  Consulted cardiology as patient has a history of non-STEMI in March 2014. At that time card a cath confirmed LAD occlusion. No further work up per cardiology, started on Imdur 30 mg po daily. Continue Carafate 1 g 3 times a day. 2. Anemia- likely due to the myelodysplasia and questionable GI bleed. Patient had bone marrow biopsy in November 2015 which revealed hypercellular marrow with abundant iron stores. Called and  Discussed with GI Dr Michail Sermon, who says that patient had EGD in September, and if he is not having overt bleed, there is no indication for EGD. Patient to follow up with Dr Mike Craze as outpatient. 3. 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 [name]. Status post primary resection on 06/04/2013. C2 down showed extensive carcinomatosis. Continue Gleevec. 4. Hypertension- continue HCTZ 25 mg daily, restart Lopressor 25 twice a day as per cardiogenic recommendation. 5. CAD- continue metoprolol, Imdur 30 mg daily, simvastatin 20 minute grams daily. Cardiology started Imdur in the hospital, will discharge on Imdur 30 mg po daily.   Procedures:  None  Consultations:  Cardiology  Discharge Exam: Filed Vitals:   01/15/15 0921  BP: 147/46  Pulse: 73  Temp:   Resp:     General: Appear in no acute distress Cardiovascular: S1s2 RRR Respiratory: Clear bilaterally  Discharge Instructions   Discharge Instructions  Diet - low sodium heart healthy    Complete by:  As directed      Increase activity slowly    Complete by:  As directed            Current Discharge Medication List    START taking these medications   Details  isosorbide mononitrate (IMDUR) 30 MG 24 hr tablet Take 1 tablet (30 mg total) by mouth daily. Qty: 30 tablet, Refills: 2      CONTINUE these medications which have NOT CHANGED   Details  benazepril (LOTENSIN) 20 MG tablet Take 20 mg by mouth daily.    hydrochlorothiazide (HYDRODIURIL) 25 MG tablet Take 25 mg by mouth daily.    imatinib (GLEEVEC) 400 MG tablet Take 1 tablet (400 mg total) by mouth daily. Take with meals and large glass of water.Caution:Chemotherapy. Qty: 30 tablet, Refills: 2   Associated Diagnoses: GIST (gastrointestinal stromal tumor) of small bowel, malignant    metoprolol tartrate (LOPRESSOR) 25 MG tablet Take 1 tablet (25 mg total) by mouth 2 (two) times daily. Qty: 60 tablet, Refills: 2    simvastatin (ZOCOR) 20 MG tablet Take 20 mg by mouth every evening.    sucralfate (CARAFATE) 1 GM/10ML suspension Take 10 mLs (1 g total) by mouth 3 (three) times daily with meals. Qty: 420 mL, Refills: 0    esomeprazole (NEXIUM) 20 MG capsule Take 1 capsule (20 mg total) by mouth daily at 12 noon. Qty: 30 capsule, Refills: 0      STOP taking these medications     ranitidine (ZANTAC) 150 MG capsule        No Known Allergies    The results of significant diagnostics from this hospitalization (including imaging, microbiology, ancillary and laboratory) are listed below for reference.    Significant Diagnostic Studies: Ct Abdomen Pelvis Wo Contrast  01/13/2015   CLINICAL DATA:  Epigastric pain for several hr. Mild nausea. History of abdominal laparotomy to remove stromal tumor on 06/14. Oral chemotherapy.  EXAM: CT ABDOMEN AND PELVIS WITHOUT CONTRAST  TECHNIQUE: Multidetector CT imaging of the abdomen and pelvis was performed following the standard protocol without IV contrast.  COMPARISON:  10/16/2014  FINDINGS: Moderate size left pleural effusion with basilar atelectasis. Cardiac  enlargement.  Evaluation of solid organs and vascular structures is limited due to lack of IV contrast material. There is mild abdominal ascites. This is mostly around the liver edge. Residual mesenteric edema and nodular mesenteric and peritoneal disease consistent with known metastatic disease. Peritoneal disease is improving since the previous study consistent with response to interval therapy. Soft tissue mass in the anterior abdomen at the level of the inferior liver edge measuring 2.3 x 3.5 cm. This likely represents residual disease. This mass appears smaller than on previous study. Multiple stones are demonstrated in the gallbladder. No gallbladder wall thickening or edema. The liver, spleen, pancreas, adrenal glands, kidneys, inferior vena cava, and retroperitoneal lymph nodes are unremarkable. Diffuse calcification of the abdominal aorta without aneurysm. Stomach, small bowel, and colon are decompressed. There appears to be a bowel anastomosis in the left lower quadrant. No free air in the abdomen.  Pelvis: Prostate gland is not enlarged. Bladder wall is not thickened. Small amount of free fluid in the pelvis. Appendix is normal. No pelvic lymphadenopathy. Degenerative changes in the spine. No destructive bone lesions.  IMPRESSION: Peritoneal and omental metastatic disease with free fluid in the abdomen. These changes are improving since previous study. Cholelithiasis. Left pleural effusion with basilar atelectasis or  consolidation.   Electronically Signed   By: Lucienne Capers M.D.   On: 01/13/2015 04:37    Microbiology: No results found for this or any previous visit (from the past 240 hour(s)).   Labs: Basic Metabolic Panel:  Recent Labs Lab 01/13/15 0100 01/14/15 0445 01/15/15 0515  NA 141 138 139  K 3.9 3.3* 3.5  CL 103 103 103  CO2 28 29 29   GLUCOSE 160* 116* 100*  BUN 22 15 19   CREATININE 1.06 0.94 1.07  CALCIUM 9.1 8.2* 8.3*   Liver Function Tests:  Recent Labs Lab  01/13/15 0100  AST 25  ALT 38  ALKPHOS 103  BILITOT 0.7  PROT 6.7  ALBUMIN 3.8    Recent Labs Lab 01/13/15 0100  LIPASE 33   No results for input(s): AMMONIA in the last 168 hours. CBC:  Recent Labs Lab 01/13/15 0100 01/14/15 0445 01/15/15 0515  WBC 6.6 7.7 5.7  NEUTROABS 5.5  --   --   HGB 12.1* 11.5* 10.6*  HCT 35.6* 34.2* 31.5*  MCV 100.8* 100.3* 101.3*  PLT 197 169 182   Cardiac Enzymes:  Recent Labs Lab 01/13/15 0504 01/13/15 1150 01/13/15 1829 01/14/15 0029 01/14/15 1035  CKTOTAL 61  --   --   --   --   TROPONINI 0.07* 0.06* 0.05* 0.05* 0.04*   BNP: BNP (last 3 results)  Recent Labs  09/12/14 0445  PROBNP 5726.0*   CBG:  Recent Labs Lab 01/13/15 0812 01/14/15 0729 01/15/15 0737  GLUCAP 132* 108* 103*       Signed:  Justan Gaede S  [facility] 01/15/2015, 9:47 AM

## 2015-01-19 ENCOUNTER — Ambulatory Visit (HOSPITAL_COMMUNITY): Payer: Medicare Other

## 2015-01-19 DIAGNOSIS — E782 Mixed hyperlipidemia: Secondary | ICD-10-CM | POA: Diagnosis not present

## 2015-01-19 DIAGNOSIS — I10 Essential (primary) hypertension: Secondary | ICD-10-CM | POA: Diagnosis not present

## 2015-01-19 DIAGNOSIS — R1013 Epigastric pain: Secondary | ICD-10-CM | POA: Diagnosis not present

## 2015-01-19 DIAGNOSIS — D481 Neoplasm of uncertain behavior of connective and other soft tissue: Secondary | ICD-10-CM | POA: Diagnosis not present

## 2015-01-22 ENCOUNTER — Other Ambulatory Visit: Payer: Medicare Other

## 2015-01-22 ENCOUNTER — Ambulatory Visit: Payer: Medicare Other | Admitting: Oncology

## 2015-02-13 ENCOUNTER — Other Ambulatory Visit: Payer: Self-pay | Admitting: *Deleted

## 2015-02-13 ENCOUNTER — Ambulatory Visit (HOSPITAL_BASED_OUTPATIENT_CLINIC_OR_DEPARTMENT_OTHER): Payer: Medicare Other | Admitting: Oncology

## 2015-02-13 ENCOUNTER — Telehealth: Payer: Self-pay | Admitting: Oncology

## 2015-02-13 ENCOUNTER — Other Ambulatory Visit (HOSPITAL_BASED_OUTPATIENT_CLINIC_OR_DEPARTMENT_OTHER): Payer: Medicare Other

## 2015-02-13 VITALS — BP 162/59 | HR 68 | Temp 98.2°F | Resp 19 | Ht 67.0 in | Wt 155.2 lb

## 2015-02-13 DIAGNOSIS — C49A3 Gastrointestinal stromal tumor of small intestine: Secondary | ICD-10-CM

## 2015-02-13 DIAGNOSIS — C494 Malignant neoplasm of connective and soft tissue of abdomen: Secondary | ICD-10-CM | POA: Diagnosis not present

## 2015-02-13 DIAGNOSIS — D649 Anemia, unspecified: Secondary | ICD-10-CM

## 2015-02-13 LAB — COMPREHENSIVE METABOLIC PANEL (CC13)
ALBUMIN: 3.3 g/dL — AB (ref 3.5–5.0)
ALK PHOS: 173 U/L — AB (ref 40–150)
ALT: 117 U/L — ABNORMAL HIGH (ref 0–55)
AST: 35 U/L — ABNORMAL HIGH (ref 5–34)
Anion Gap: 9 mEq/L (ref 3–11)
BUN: 31.6 mg/dL — ABNORMAL HIGH (ref 7.0–26.0)
CALCIUM: 8.6 mg/dL (ref 8.4–10.4)
CO2: 29 mEq/L (ref 22–29)
Chloride: 107 mEq/L (ref 98–109)
Creatinine: 1.4 mg/dL — ABNORMAL HIGH (ref 0.7–1.3)
EGFR: 46 mL/min/{1.73_m2} — AB (ref >90)
GLUCOSE: 131 mg/dL (ref 70–140)
POTASSIUM: 3.3 meq/L — AB (ref 3.5–5.1)
Sodium: 145 mEq/L (ref 136–145)
TOTAL PROTEIN: 6.3 g/dL — AB (ref 6.4–8.3)
Total Bilirubin: 1.3 mg/dL — ABNORMAL HIGH (ref 0.20–1.20)

## 2015-02-13 LAB — CBC WITH DIFFERENTIAL/PLATELET
BASO%: 0.5 % (ref 0.0–2.0)
Basophils Absolute: 0 10*3/uL (ref 0.0–0.1)
EOS%: 6.9 % (ref 0.0–7.0)
Eosinophils Absolute: 0.4 10*3/uL (ref 0.0–0.5)
HCT: 32.7 % — ABNORMAL LOW (ref 38.4–49.9)
HGB: 10.9 g/dL — ABNORMAL LOW (ref 13.0–17.1)
LYMPH%: 15.8 % (ref 14.0–49.0)
MCH: 34.4 pg — ABNORMAL HIGH (ref 27.2–33.4)
MCHC: 33.5 g/dL (ref 32.0–36.0)
MCV: 102.5 fL — ABNORMAL HIGH (ref 79.3–98.0)
MONO#: 0.6 10*3/uL (ref 0.1–0.9)
MONO%: 10.4 % (ref 0.0–14.0)
NEUT#: 3.5 10*3/uL (ref 1.5–6.5)
NEUT%: 66.4 % (ref 39.0–75.0)
PLATELETS: 191 10*3/uL (ref 140–400)
RBC: 3.19 10*6/uL — ABNORMAL LOW (ref 4.20–5.82)
RDW: 13.8 % (ref 11.0–14.6)
WBC: 5.3 10*3/uL (ref 4.0–10.3)
lymph#: 0.8 10*3/uL — ABNORMAL LOW (ref 0.9–3.3)

## 2015-02-13 MED ORDER — IMATINIB MESYLATE 400 MG PO TABS: 400.0000 mg | ORAL_TABLET | Freq: Every day | ORAL | Status: DC

## 2015-02-13 NOTE — Telephone Encounter (Signed)
Gave avs & calendar for March. °

## 2015-02-13 NOTE — Progress Notes (Addendum)
  [name] OFFICE PROGRESS NOTE   Diagnosis: Gastrointestinal stromal tumor  INTERVAL HISTORY:   [name] [name] was admitted on 01/13/2015 with upper abdomen discomfort. The pain improved on he was in the hospital and there was no apparent explanation for the pain. He was discharged 01/15/2015. A CT of the abdomen and pelvis on 01/13/2015 revealed improvement in the peritoneal and omental metastatic disease compared to a CT from October. [name] [name] continues [name] [name]. Good appetite and energy level. No diarrhea. He reports increased "gas "recently.  Objective:  Vital signs in last 24 hours:  Blood pressure 162/59, pulse 68, temperature 98.2 F (36.8 C), temperature source Oral, resp. rate 19, height $RemoveBe'5\' 7"'kGImULUrY$  (1.702 m), weight 155 lb 3.2 oz (70.398 kg), SpO2 100 %.    HEENT: No thrush or ulcers Resp: Lungs clear bilaterally Cardio: Regular rate and rhythm GI: Soft and nontender, no hepatosplenomegaly, no apparent ascites, no mass, reducible right inguinal hernia Vascular: No leg edema  Skin: Fine erythematous rash over the trunk   Portacath/PICC-without erythema  Lab Results:  Lab Results  Component Value Date   WBC 5.3 02/13/2015   HGB 10.9* 02/13/2015   HCT 32.7* 02/13/2015   MCV 102.5* 02/13/2015   PLT 191 02/13/2015   NEUTROABS 3.5 02/13/2015   Potassium 3.3, BUN 31.6, creatinine 1.4, alk phosphatase 173, AST 35, ALT 117, bilirubin 1.3 Medications: I have reviewed the patient's current medications.  Assessment/plan:  1.Gastrointestinal stromal tumor of the small bowel, stage II (T3, N0, M0), status post primary resection on 06/04/2013 , he declined adjuvant Gleevec   CT abdomen/pelvis 10/16/2014 consistent with extensive carcinomatosis  CT-guided biopsy of an omental mass 10/21/2014 confirmed a gastrointestinal stromal tumor  Initiation of Gleevec at a dose of 300 mg daily on 10/31/2014  Gleevec increased to 400 mg daily on 11/14/2014  CT abdomen/pelvis  01/13/2015 revealed improvement in the peritoneal and omental metastatic disease 2. Anemia-potentially related to GI bleeding or myelodysplasia  Bone marrow biopsy 10/21/2014 revealed a hypercellular marrow with abundant iron stores, 46X-Y, 46XY karyotype  Improved  3. NSTEMI-March 2014, cardiac catheterization 03/05/2013 confirmed an LAD occlusion  4. Presyncope event 02/27/2013 5. Admission 09/12/2014 with respiratory failure secondary to pulmonary edema 6. Edema-likely secondary to abdominal tumor and hypoalbuminemia. Resolved.   Disposition:  [name] appears well. He appears to be tolerating the [name] [name] well and the CT last month revealed significant improvement in the abdominal tumor burden. The liver enzymes are elevated today. They have been elevated in the past. He will return for a repeat chemistry panel next week. I asked him to hold the benzapril secondary to the elevated creatinine.  [name] Coder, MD  02/13/2015  2:15 PM

## 2015-02-18 ENCOUNTER — Other Ambulatory Visit (HOSPITAL_BASED_OUTPATIENT_CLINIC_OR_DEPARTMENT_OTHER): Payer: Medicare Other

## 2015-02-18 ENCOUNTER — Telehealth: Payer: Self-pay | Admitting: *Deleted

## 2015-02-18 DIAGNOSIS — D649 Anemia, unspecified: Secondary | ICD-10-CM | POA: Diagnosis not present

## 2015-02-18 DIAGNOSIS — C494 Malignant neoplasm of connective and soft tissue of abdomen: Secondary | ICD-10-CM

## 2015-02-18 DIAGNOSIS — C49A3 Gastrointestinal stromal tumor of small intestine: Secondary | ICD-10-CM

## 2015-02-18 LAB — COMPREHENSIVE METABOLIC PANEL (CC13)
ALBUMIN: 3.3 g/dL — AB (ref 3.5–5.0)
ALK PHOS: 153 U/L — AB (ref 40–150)
ALT: 59 U/L — ABNORMAL HIGH (ref 0–55)
ANION GAP: 9 meq/L (ref 3–11)
AST: 31 U/L (ref 5–34)
BUN: 16.1 mg/dL (ref 7.0–26.0)
CO2: 29 mEq/L (ref 22–29)
Calcium: 8.6 mg/dL (ref 8.4–10.4)
Chloride: 106 mEq/L (ref 98–109)
Creatinine: 1.2 mg/dL (ref 0.7–1.3)
EGFR: 58 mL/min/{1.73_m2} — AB (ref >90)
GLUCOSE: 104 mg/dL (ref 70–140)
Potassium: 4.4 mEq/L (ref 3.5–5.1)
Sodium: 144 mEq/L (ref 136–145)
Total Bilirubin: 0.98 mg/dL (ref 0.20–1.20)
Total Protein: 6.2 g/dL — ABNORMAL LOW (ref 6.4–8.3)

## 2015-02-18 NOTE — Telephone Encounter (Signed)
-----   Message from Ladell Pier, MD sent at 02/18/2015  1:09 PM EST ----- Please call patient, labs are better, continue gleevec, f/u as scheduled , hold benzepril until next visit

## 2015-02-18 NOTE — Telephone Encounter (Signed)
Return call received from patient.  This nurse shared orders per Dr. [name].  Patient verbalized with teach back method that he will hold the benazapril until next F/U on 03-11-2015.  Will continue te Gleevec.  Thanked ne for the information.

## 2015-02-18 NOTE — Telephone Encounter (Signed)
Left message for pt to call office re: labs and medication adjustment.

## 2015-03-11 ENCOUNTER — Telehealth: Payer: Self-pay | Admitting: Nurse Practitioner

## 2015-03-11 ENCOUNTER — Other Ambulatory Visit (HOSPITAL_BASED_OUTPATIENT_CLINIC_OR_DEPARTMENT_OTHER): Payer: Medicare Other

## 2015-03-11 ENCOUNTER — Ambulatory Visit (HOSPITAL_BASED_OUTPATIENT_CLINIC_OR_DEPARTMENT_OTHER): Payer: Medicare Other | Admitting: Nurse Practitioner

## 2015-03-11 VITALS — BP 182/58 | HR 64 | Temp 98.3°F | Resp 18 | Ht 67.0 in | Wt 156.2 lb

## 2015-03-11 DIAGNOSIS — D649 Anemia, unspecified: Secondary | ICD-10-CM | POA: Diagnosis not present

## 2015-03-11 DIAGNOSIS — C49A3 Gastrointestinal stromal tumor of small intestine: Secondary | ICD-10-CM

## 2015-03-11 DIAGNOSIS — C494 Malignant neoplasm of connective and soft tissue of abdomen: Secondary | ICD-10-CM

## 2015-03-11 DIAGNOSIS — R944 Abnormal results of kidney function studies: Secondary | ICD-10-CM | POA: Diagnosis not present

## 2015-03-11 LAB — CBC WITH DIFFERENTIAL/PLATELET
BASO%: 0.7 % (ref 0.0–2.0)
Basophils Absolute: 0 10*3/uL (ref 0.0–0.1)
EOS%: 11.6 % — AB (ref 0.0–7.0)
Eosinophils Absolute: 0.6 10*3/uL — ABNORMAL HIGH (ref 0.0–0.5)
HCT: 34 % — ABNORMAL LOW (ref 38.4–49.9)
HGB: 11.4 g/dL — ABNORMAL LOW (ref 13.0–17.1)
LYMPH%: 22.9 % (ref 14.0–49.0)
MCH: 35.3 pg — AB (ref 27.2–33.4)
MCHC: 33.4 g/dL (ref 32.0–36.0)
MCV: 105.7 fL — AB (ref 79.3–98.0)
MONO#: 0.5 10*3/uL (ref 0.1–0.9)
MONO%: 9.3 % (ref 0.0–14.0)
NEUT%: 55.5 % (ref 39.0–75.0)
NEUTROS ABS: 2.8 10*3/uL (ref 1.5–6.5)
PLATELETS: 198 10*3/uL (ref 140–400)
RBC: 3.21 10*6/uL — AB (ref 4.20–5.82)
RDW: 14 % (ref 11.0–14.6)
WBC: 5.1 10*3/uL (ref 4.0–10.3)
lymph#: 1.2 10*3/uL (ref 0.9–3.3)

## 2015-03-11 LAB — COMPREHENSIVE METABOLIC PANEL (CC13)
ALT: 34 U/L (ref 0–55)
AST: 24 U/L (ref 5–34)
Albumin: 3.5 g/dL (ref 3.5–5.0)
Alkaline Phosphatase: 98 U/L (ref 40–150)
Anion Gap: 5 mEq/L (ref 3–11)
BUN: 19.5 mg/dL (ref 7.0–26.0)
CALCIUM: 8.7 mg/dL (ref 8.4–10.4)
CHLORIDE: 107 meq/L (ref 98–109)
CO2: 31 meq/L — AB (ref 22–29)
CREATININE: 1.2 mg/dL (ref 0.7–1.3)
EGFR: 53 mL/min/{1.73_m2} — AB (ref >90)
Glucose: 103 mg/dl (ref 70–140)
Potassium: 4 mEq/L (ref 3.5–5.1)
Sodium: 143 mEq/L (ref 136–145)
TOTAL PROTEIN: 6.5 g/dL (ref 6.4–8.3)
Total Bilirubin: 0.9 mg/dL (ref 0.20–1.20)

## 2015-03-11 NOTE — Progress Notes (Signed)
  [place] OFFICE PROGRESS NOTE   Diagnosis:  Gastrointestinal stromal tumor  INTERVAL HISTORY:   Mr. Schuenemann returns as scheduled. He continues [place]. He denies nausea/vomiting. No mouth sores. He has occasional loose stools. No rash. He denies abdominal pain. He has a good appetite. His energy level continues to improve.  Objective:  Vital signs in last 24 hours:  Blood pressure 171/54, pulse 62. temperature 98.3, heart rate 64, respirations 18, initial blood pressure 182/58    HEENT: No thrush or ulcers. Resp: Lungs clear bilaterally. Cardio: Regular rate and rhythm. GI: Abdomen soft and nontender. No hepatomegaly. No mass. Vascular: No leg edema. Skin: No rash.    Lab Results:  Lab Results  Component Value Date   WBC 5.1 03/11/2015   HGB 11.4* 03/11/2015   HCT 34.0* 03/11/2015   MCV 105.7* 03/11/2015   PLT 198 03/11/2015   NEUTROABS 2.8 03/11/2015    Imaging:  No results found.  Medications: I have reviewed the patient's current medications.  Assessment/Plan: 1.Gastrointestinal stromal tumor of the small bowel, stage II (T3, N0, M0), status post primary resection on 06/04/2013 , he declined adjuvant Gleevec   CT abdomen/pelvis 10/16/2014 consistent with extensive carcinomatosis  CT-guided biopsy of an omental mass 10/21/2014 confirmed a gastrointestinal stromal tumor  Initiation of Gleevec at a dose of 300 mg daily on 10/31/2014  Gleevec increased to 400 mg daily on 11/14/2014  CT abdomen/pelvis 01/13/2015 revealed improvement in the peritoneal and omental metastatic disease 2. Anemia-potentially related to GI bleeding or myelodysplasia  Bone marrow biopsy 10/21/2014 revealed a hypercellular marrow with abundant iron stores, 46X-Y, 46XY karyotype  Improved 3. NSTEMI-March 2014, cardiac catheterization 03/05/2013 confirmed an LAD occlusion  4. Presyncope event 02/27/2013 5. Admission 09/12/2014 with respiratory failure secondary to  pulmonary edema 6. Edema-likely secondary to abdominal tumor and hypoalbuminemia. Resolved. 7.  Elevated liver enzymes 02/13/2015. Improved 02/18/2015. Liver enzymes in normal range 03/11/2015. 8.  Elevated creatinine (1.4) 02/13/2015. Benzapril placed on hold. Creatinine improved at 1.2 02/18/2015. Creatinine stable at 1.2 03/11/2015.   Disposition: Mr. Karaffa appears stable. He will continue Gleevec.   The liver enzymes have normalized.  Benzapril was placed on hold 02/13/2015 due to an elevated creatinine. The creatinine was slightly improved on 02/18/2015 and stable today. We will refer him back to Dr. Maceo Pro regarding whether or not to resume Benzapril.  He will return for a follow-up visit here in one month.  Plan reviewed with Dr. [name].  Ned Card ANP/GNP-BC   03/11/2015  9:34 AM

## 2015-03-11 NOTE — Telephone Encounter (Signed)
Gave avs & calendar for April. °

## 2015-03-17 DIAGNOSIS — I10 Essential (primary) hypertension: Secondary | ICD-10-CM | POA: Diagnosis not present

## 2015-04-09 ENCOUNTER — Telehealth: Payer: Self-pay | Admitting: Oncology

## 2015-04-09 ENCOUNTER — Other Ambulatory Visit (HOSPITAL_BASED_OUTPATIENT_CLINIC_OR_DEPARTMENT_OTHER): Payer: Medicare Other

## 2015-04-09 ENCOUNTER — Ambulatory Visit (HOSPITAL_BASED_OUTPATIENT_CLINIC_OR_DEPARTMENT_OTHER): Payer: Medicare Other | Admitting: Oncology

## 2015-04-09 VITALS — BP 171/54 | HR 64 | Temp 98.1°F | Resp 18 | Ht 67.0 in | Wt 161.2 lb

## 2015-04-09 DIAGNOSIS — C494 Malignant neoplasm of connective and soft tissue of abdomen: Secondary | ICD-10-CM

## 2015-04-09 DIAGNOSIS — C49A3 Gastrointestinal stromal tumor of small intestine: Secondary | ICD-10-CM

## 2015-04-09 DIAGNOSIS — H6123 Impacted cerumen, bilateral: Secondary | ICD-10-CM | POA: Diagnosis not present

## 2015-04-09 LAB — COMPREHENSIVE METABOLIC PANEL (CC13)
ALT: 34 U/L (ref 0–55)
AST: 26 U/L (ref 5–34)
Albumin: 3.6 g/dL (ref 3.5–5.0)
Alkaline Phosphatase: 79 U/L (ref 40–150)
Anion Gap: 11 mEq/L (ref 3–11)
BILIRUBIN TOTAL: 0.55 mg/dL (ref 0.20–1.20)
BUN: 19.3 mg/dL (ref 7.0–26.0)
CALCIUM: 8.6 mg/dL (ref 8.4–10.4)
CO2: 27 mEq/L (ref 22–29)
CREATININE: 1.2 mg/dL (ref 0.7–1.3)
Chloride: 107 mEq/L (ref 98–109)
EGFR: 56 mL/min/{1.73_m2} — ABNORMAL LOW (ref >90)
Glucose: 116 mg/dl (ref 70–140)
Potassium: 4.1 mEq/L (ref 3.5–5.1)
Sodium: 145 mEq/L (ref 136–145)
Total Protein: 6.3 g/dL — ABNORMAL LOW (ref 6.4–8.3)

## 2015-04-09 LAB — CBC WITH DIFFERENTIAL/PLATELET
BASO%: 0.2 % (ref 0.0–2.0)
BASOS ABS: 0 10*3/uL (ref 0.0–0.1)
EOS%: 9.2 % — AB (ref 0.0–7.0)
Eosinophils Absolute: 0.5 10*3/uL (ref 0.0–0.5)
HEMATOCRIT: 34.1 % — AB (ref 38.4–49.9)
HGB: 11.4 g/dL — ABNORMAL LOW (ref 13.0–17.1)
LYMPH%: 23.9 % (ref 14.0–49.0)
MCH: 35.5 pg — AB (ref 27.2–33.4)
MCHC: 33.4 g/dL (ref 32.0–36.0)
MCV: 106.2 fL — ABNORMAL HIGH (ref 79.3–98.0)
MONO#: 0.4 10*3/uL (ref 0.1–0.9)
MONO%: 8.3 % (ref 0.0–14.0)
NEUT#: 3.1 10*3/uL (ref 1.5–6.5)
NEUT%: 58.4 % (ref 39.0–75.0)
PLATELETS: 173 10*3/uL (ref 140–400)
RBC: 3.21 10*6/uL — ABNORMAL LOW (ref 4.20–5.82)
RDW: 12.5 % (ref 11.0–14.6)
WBC: 5.3 10*3/uL (ref 4.0–10.3)
lymph#: 1.3 10*3/uL (ref 0.9–3.3)

## 2015-04-09 NOTE — Progress Notes (Signed)
  [place] OFFICE PROGRESS NOTE   Diagnosis: Gastrointestinal stromal tumor  INTERVAL HISTORY:   [name] returns as scheduled. He feels well. No rash, diarrhea, or abdominal pain. He recently had transient swelling at the ankles.  Objective:  Vital signs in last 24 hours:  Blood pressure 171/54, pulse 64, temperature 98.1 F (36.7 C), temperature source Oral, resp. rate 18, height _0  (1.702 m), weight 161 lb 3.2 oz (73.12 kg), SpO2 100 %.    HEENT: No thrush or ulcers Resp: Lungs clear bilaterally Cardio: Regular rate and rhythm GI: No hepatosplenomegaly, nontender, no mass Vascular: No leg edema  Skin: No rash    Lab Results:  Lab Results  Component Value Date   WBC 5.3 04/09/2015   HGB 11.4* 04/09/2015   HCT 34.1* 04/09/2015   MCV 106.2* 04/09/2015   PLT 173 04/09/2015   NEUTROABS 3.1 04/09/2015   Potassium 4.1, BUN 19.3, creatinine 1.2  Medications: I have reviewed the patient's current medications.  Assessment/Plan: 1.Gastrointestinal stromal tumor of the small bowel, stage II (T3, N0, M0), status post primary resection on 06/04/2013 , he declined adjuvant Gleevec   CT abdomen/pelvis 10/16/2014 consistent with extensive carcinomatosis  CT-guided biopsy of an omental mass 10/21/2014 confirmed a gastrointestinal stromal tumor  Initiation of Gleevec at a dose of 300 mg daily on 10/31/2014  Gleevec increased to 400 mg daily on 11/14/2014  CT abdomen/pelvis 01/13/2015 revealed improvement in the peritoneal and omental metastatic disease 2. Anemia-potentially related to GI bleeding or myelodysplasia  Bone marrow biopsy 10/21/2014 revealed a hypercellular marrow with abundant iron stores, 46X-Y, 46XY karyotype  Improved 3. NSTEMI-March 2014, cardiac catheterization 03/05/2013 confirmed an LAD occlusion  4. Presyncope event 02/27/2013 5. Admission 09/12/2014 with respiratory failure secondary to pulmonary edema 6. Edema-likely  secondary to abdominal tumor and hypoalbuminemia. Resolved. 7. Elevated liver enzymes 02/13/2015. Improved 02/18/2015. Liver enzymes in normal range 03/11/2015. 8. Elevated creatinine (1.4) 02/13/2015. Benzapril placed on hold. Creatinine improved at 1.2 02/18/2015. Creatinine stable at 1.2 04/09/2015 9. Hypertension   Disposition:  [name] appears to be tolerating the [place] well. There is no clinical evidence of disease progression. He will continue Gleevec at the current dose. [name] will return for an office visit in one month. We will plan for a restaging CT in the next 2-3 months.  Betsy Coder, MD  04/09/2015  9:40 AM

## 2015-04-09 NOTE — Telephone Encounter (Signed)
gave and printed appt sched and avs fo rpt for May °

## 2015-04-27 DIAGNOSIS — I2510 Atherosclerotic heart disease of native coronary artery without angina pectoris: Secondary | ICD-10-CM | POA: Diagnosis not present

## 2015-04-27 DIAGNOSIS — K4090 Unilateral inguinal hernia, without obstruction or gangrene, not specified as recurrent: Secondary | ICD-10-CM | POA: Diagnosis not present

## 2015-04-27 DIAGNOSIS — Z8509 Personal history of malignant neoplasm of other digestive organs: Secondary | ICD-10-CM | POA: Diagnosis not present

## 2015-04-27 DIAGNOSIS — K432 Incisional hernia without obstruction or gangrene: Secondary | ICD-10-CM | POA: Diagnosis not present

## 2015-05-05 ENCOUNTER — Inpatient Hospital Stay (HOSPITAL_COMMUNITY)
Admission: EM | Admit: 2015-05-05 | Discharge: 2015-05-20 | DRG: 372 | Disposition: A | Discharge status: Home Health | Payer: Medicare Other | Attending: Internal Medicine | Admitting: Internal Medicine

## 2015-05-05 ENCOUNTER — Encounter (HOSPITAL_COMMUNITY): Payer: Self-pay | Admitting: Emergency Medicine

## 2015-05-05 ENCOUNTER — Emergency Department (HOSPITAL_COMMUNITY): Payer: Medicare Other

## 2015-05-05 DIAGNOSIS — N183 Chronic kidney disease, stage 3 unspecified: Secondary | ICD-10-CM | POA: Diagnosis present

## 2015-05-05 DIAGNOSIS — K353 Acute appendicitis with localized peritonitis: Secondary | ICD-10-CM | POA: Diagnosis not present

## 2015-05-05 DIAGNOSIS — R609 Edema, unspecified: Secondary | ICD-10-CM

## 2015-05-05 DIAGNOSIS — N508 Other specified disorders of male genital organs: Secondary | ICD-10-CM | POA: Diagnosis present

## 2015-05-05 DIAGNOSIS — R1033 Periumbilical pain: Secondary | ICD-10-CM | POA: Diagnosis not present

## 2015-05-05 DIAGNOSIS — R140 Abdominal distension (gaseous): Secondary | ICD-10-CM

## 2015-05-05 DIAGNOSIS — C786 Secondary malignant neoplasm of retroperitoneum and peritoneum: Secondary | ICD-10-CM | POA: Diagnosis not present

## 2015-05-05 DIAGNOSIS — Z789 Other specified health status: Secondary | ICD-10-CM

## 2015-05-05 DIAGNOSIS — D630 Anemia in neoplastic disease: Secondary | ICD-10-CM | POA: Diagnosis present

## 2015-05-05 DIAGNOSIS — N179 Acute kidney failure, unspecified: Secondary | ICD-10-CM | POA: Diagnosis present

## 2015-05-05 DIAGNOSIS — D72829 Elevated white blood cell count, unspecified: Secondary | ICD-10-CM | POA: Diagnosis present

## 2015-05-05 DIAGNOSIS — E785 Hyperlipidemia, unspecified: Secondary | ICD-10-CM | POA: Diagnosis present

## 2015-05-05 DIAGNOSIS — R6510 Systemic inflammatory response syndrome (SIRS) of non-infectious origin without acute organ dysfunction: Secondary | ICD-10-CM | POA: Diagnosis present

## 2015-05-05 DIAGNOSIS — C49A3 Gastrointestinal stromal tumor of small intestine: Secondary | ICD-10-CM

## 2015-05-05 DIAGNOSIS — I5032 Chronic diastolic (congestive) heart failure: Secondary | ICD-10-CM | POA: Diagnosis present

## 2015-05-05 DIAGNOSIS — I252 Old myocardial infarction: Secondary | ICD-10-CM

## 2015-05-05 DIAGNOSIS — E860 Dehydration: Secondary | ICD-10-CM | POA: Diagnosis present

## 2015-05-05 DIAGNOSIS — C762 Malignant neoplasm of abdomen: Secondary | ICD-10-CM | POA: Diagnosis present

## 2015-05-05 DIAGNOSIS — A047 Enterocolitis due to Clostridium difficile: Secondary | ICD-10-CM | POA: Diagnosis not present

## 2015-05-05 DIAGNOSIS — D649 Anemia, unspecified: Secondary | ICD-10-CM | POA: Diagnosis present

## 2015-05-05 DIAGNOSIS — I10 Essential (primary) hypertension: Secondary | ICD-10-CM | POA: Diagnosis present

## 2015-05-05 DIAGNOSIS — Z79899 Other long term (current) drug therapy: Secondary | ICD-10-CM

## 2015-05-05 DIAGNOSIS — R109 Unspecified abdominal pain: Secondary | ICD-10-CM | POA: Diagnosis not present

## 2015-05-05 DIAGNOSIS — K5900 Constipation, unspecified: Secondary | ICD-10-CM | POA: Diagnosis present

## 2015-05-05 DIAGNOSIS — R1013 Epigastric pain: Secondary | ICD-10-CM | POA: Diagnosis present

## 2015-05-05 DIAGNOSIS — K5730 Diverticulosis of large intestine without perforation or abscess without bleeding: Secondary | ICD-10-CM | POA: Diagnosis not present

## 2015-05-05 DIAGNOSIS — K8020 Calculus of gallbladder without cholecystitis without obstruction: Secondary | ICD-10-CM | POA: Diagnosis not present

## 2015-05-05 DIAGNOSIS — R188 Other ascites: Secondary | ICD-10-CM | POA: Diagnosis present

## 2015-05-05 DIAGNOSIS — IMO0002 Reserved for concepts with insufficient information to code with codable children: Secondary | ICD-10-CM | POA: Insufficient documentation

## 2015-05-05 DIAGNOSIS — A0472 Enterocolitis due to Clostridium difficile, not specified as recurrent: Secondary | ICD-10-CM | POA: Diagnosis not present

## 2015-05-05 DIAGNOSIS — I2510 Atherosclerotic heart disease of native coronary artery without angina pectoris: Secondary | ICD-10-CM | POA: Diagnosis present

## 2015-05-05 DIAGNOSIS — R339 Retention of urine, unspecified: Secondary | ICD-10-CM | POA: Diagnosis present

## 2015-05-05 DIAGNOSIS — N433 Hydrocele, unspecified: Secondary | ICD-10-CM | POA: Diagnosis present

## 2015-05-05 DIAGNOSIS — I129 Hypertensive chronic kidney disease with stage 1 through stage 4 chronic kidney disease, or unspecified chronic kidney disease: Secondary | ICD-10-CM | POA: Diagnosis present

## 2015-05-05 DIAGNOSIS — C494 Malignant neoplasm of connective and soft tissue of abdomen: Secondary | ICD-10-CM | POA: Diagnosis present

## 2015-05-05 HISTORY — DX: Hemoperitoneum: K66.1

## 2015-05-05 HISTORY — DX: Chronic kidney disease, stage 3 (moderate): N18.3

## 2015-05-05 HISTORY — DX: Chronic kidney disease, stage 3 unspecified: N18.30

## 2015-05-05 HISTORY — DX: Other shock: R57.8

## 2015-05-05 LAB — CBC WITH DIFFERENTIAL/PLATELET
Basophils Absolute: 0 10*3/uL (ref 0.0–0.1)
Basophils Relative: 0 % (ref 0–1)
EOS ABS: 0.1 10*3/uL (ref 0.0–0.7)
EOS PCT: 2 % (ref 0–5)
HCT: 35.6 % — ABNORMAL LOW (ref 39.0–52.0)
Hemoglobin: 12 g/dL — ABNORMAL LOW (ref 13.0–17.0)
LYMPHS ABS: 1.2 10*3/uL (ref 0.7–4.0)
Lymphocytes Relative: 17 % (ref 12–46)
MCH: 35.1 pg — AB (ref 26.0–34.0)
MCHC: 33.7 g/dL (ref 30.0–36.0)
MCV: 104.1 fL — AB (ref 78.0–100.0)
MONO ABS: 0.4 10*3/uL (ref 0.1–1.0)
Monocytes Relative: 6 % (ref 3–12)
NEUTROS PCT: 75 % (ref 43–77)
Neutro Abs: 5.6 10*3/uL (ref 1.7–7.7)
PLATELETS: 236 10*3/uL (ref 150–400)
RBC: 3.42 MIL/uL — AB (ref 4.22–5.81)
RDW: 12.5 % (ref 11.5–15.5)
WBC: 7.3 10*3/uL (ref 4.0–10.5)

## 2015-05-05 LAB — COMPREHENSIVE METABOLIC PANEL
ALBUMIN: 4.1 g/dL (ref 3.5–5.0)
ALK PHOS: 84 U/L (ref 38–126)
ALT: 27 U/L (ref 17–63)
AST: 26 U/L (ref 15–41)
Anion gap: 12 (ref 5–15)
BUN: 21 mg/dL — ABNORMAL HIGH (ref 6–20)
CALCIUM: 8.9 mg/dL (ref 8.9–10.3)
CO2: 27 mmol/L (ref 22–32)
Chloride: 100 mmol/L — ABNORMAL LOW (ref 101–111)
Creatinine, Ser: 1.34 mg/dL — ABNORMAL HIGH (ref 0.61–1.24)
GFR calc Af Amer: 54 mL/min — ABNORMAL LOW (ref >60)
GFR calc non Af Amer: 47 mL/min — ABNORMAL LOW (ref >60)
GLUCOSE: 188 mg/dL — AB (ref 65–99)
POTASSIUM: 3.1 mmol/L — AB (ref 3.5–5.1)
SODIUM: 139 mmol/L (ref 135–145)
TOTAL PROTEIN: 7.1 g/dL (ref 6.5–8.1)
Total Bilirubin: 1 mg/dL (ref 0.3–1.2)

## 2015-05-05 LAB — LIPASE, BLOOD: Lipase: 21 U/L — ABNORMAL LOW (ref 22–51)

## 2015-05-05 LAB — I-STAT TROPONIN, ED: Troponin i, poc: 0.01 ng/mL (ref 0.00–0.08)

## 2015-05-05 MED ORDER — ACETAMINOPHEN 325 MG PO TABS
650.0000 mg | ORAL_TABLET | Freq: Once | ORAL | Status: AC
  Administered 2015-05-05: 650 mg via ORAL
  Filled 2015-05-05: qty 2

## 2015-05-05 MED ORDER — SODIUM CHLORIDE 0.9 % IV BOLUS (SEPSIS)
1000.0000 mL | Freq: Once | INTRAVENOUS | Status: AC
  Administered 2015-05-05: 1000 mL via INTRAVENOUS

## 2015-05-05 MED ORDER — SODIUM CHLORIDE 0.9 % IV BOLUS (SEPSIS)
1000.0000 mL | Freq: Once | INTRAVENOUS | Status: AC
Start: 2015-05-05 — End: 2015-05-06
  Administered 2015-05-05: 1000 mL via INTRAVENOUS

## 2015-05-05 MED ORDER — MORPHINE SULFATE 4 MG/ML IJ SOLN
4.0000 mg | Freq: Once | INTRAMUSCULAR | Status: AC
  Administered 2015-05-05: 4 mg via INTRAVENOUS
  Filled 2015-05-05: qty 1

## 2015-05-05 MED ORDER — ONDANSETRON HCL 4 MG/2ML IJ SOLN
4.0000 mg | Freq: Once | INTRAMUSCULAR | Status: AC
  Administered 2015-05-05: 4 mg via INTRAVENOUS
  Filled 2015-05-05: qty 2

## 2015-05-05 MED ORDER — POTASSIUM CHLORIDE CRYS ER 20 MEQ PO TBCR
40.0000 meq | EXTENDED_RELEASE_TABLET | Freq: Once | ORAL | Status: AC
  Administered 2015-05-05: 40 meq via ORAL
  Filled 2015-05-05: qty 2

## 2015-05-05 NOTE — ED Notes (Signed)
Pt still not feeling able to give UA

## 2015-05-05 NOTE — ED Notes (Signed)
Pt states that he has a hx of digestive CA and today was having R sided abdominal pain. Hx of same in past. Alert and oriented.

## 2015-05-05 NOTE — ED Provider Notes (Signed)
CSN: [id number]     Arrival date & time 05/05/15  2049 History   First MD Initiated Contact with Patient 05/05/15 2153     Chief Complaint  Patient presents with  . Abdominal Pain     (Consider location/radiation/quality/duration/timing/severity/associated sxs/prior Treatment) The history is provided by the patient.  [name] [name] is a 81 y.o. male history of hyperlipidemia, UTI, hypertension, NSTEMI, GIST tumor here presenting with abdominal pain. Has some periumbilical pain started today. He has some periumbilical pain started today. He states that he felt nauseated but didn't vomit. Has something similar several months ago and came to the ER and CT showed that his tumor has shrunk but he was made for pain control and hydration and then felt better. Also potentially had NSTEMI at that time. Still taking Gleevec.    Past Medical History  Diagnosis Date  . Hyperlipidemia     TAKES zOCOR DAILY  . Abdominal mass 02/2013  . UTI (urinary tract infection)   . Hypertension     TAKES LOTREL,HCTZ,AND METOPROLOL DAILY  . NSTEMI (non-ST elevated myocardial infarction) 02/2013    "LIGHT: ONE MD SAID HE DID AND ONE SAID HE DIDN'T  . Pneumonia     MAR 2014  . Stromal tumor of digestive system   . CAD (coronary artery disease)   . Shortness of breath   . Acute respiratory failure with hypoxia 09/12/2014   Past Surgical History  Procedure Laterality Date  . Cyst excision  1962    wrist  . Cardiac catheterization  03-05-13  . Laparotomy N/A 06/04/2013    Procedure: EXPLORATORY LAPAROTOMY, OPEN RESECTION OF MESENTERIC AND INTESTINAL MASS;  Surgeon: [name], MD;  Location: [facility];  Service: General;  Laterality: N/A;  Small Bowel Resection  . Colonoscopy  09/08/2014    dr scooler  . Left heart catheterization with coronary angiogram N/A 03/05/2013    Procedure: LEFT HEART CATHETERIZATION WITH CORONARY ANGIOGRAM;  Surgeon: [name], MD;  Location[facility];  Service: Cardiovascular;   Laterality: N/A;   Family History  Problem Relation Age of Onset  . Skin cancer Brother    History  Substance Use Topics  . Smoking status: Never Smoker   . Smokeless tobacco: Never Used  . Alcohol Use: No    Review of Systems  Gastrointestinal: Positive for nausea and abdominal pain.  All other systems reviewed and are negative.     Allergies  Review of patient's allergies indicates no known allergies.  Home Medications   Prior to Admission medications   Medication Sig Start Date End Date Taking? Authorizing Provider  amLODipine (NORVASC) 5 MG tablet Take 5 mg by mouth daily. 03/17/15  Yes Historical Provider, MD  hydrochlorothiazide (HYDRODIURIL) 25 MG tablet Take 25 mg by mouth daily.   Yes Historical Provider, MD  imatinib (GLEEVEC) 400 MG tablet Take 1 tablet (400 mg total) by mouth daily. Take with meals and large glass of water.Caution:Chemotherapy. 02/13/15  Yes Ladell Pier, MD  metoprolol tartrate (LOPRESSOR) 25 MG tablet Take 1 tablet (25 mg total) by mouth 2 (two) times daily. 03/06/13  Yes Costin Karlyne Greenspan, MD  simvastatin (ZOCOR) 20 MG tablet Take 20 mg by mouth every evening.   Yes Historical Provider, MD   BP 164/55 mmHg  Pulse 123  Temp(Src) 100.1 F (37.8 C) (Oral)  Resp 19  SpO2 96% Physical Exam  Constitutional: He is oriented to person, place, and time.  Chronically ill, uncomfortable   HENT:  Head: Normocephalic.  MM dry   Eyes: Conjunctivae are normal. Pupils are equal, round, and reactive to light.  Neck: Normal range of motion. Neck supple.  Cardiovascular: Regular rhythm and normal heart sounds.   Slightly tachy   Pulmonary/Chest: Effort normal and breath sounds normal. No respiratory distress. He has no wheezes. He has no rales.  Abdominal:  Distended, umbilical hernia, reducible, nontender. Tenderness and swelling above the umbilical hernia.   Musculoskeletal: Normal range of motion. He exhibits no edema or tenderness.  Neurological: He  is alert and oriented to person, place, and time. No cranial nerve deficit. Coordination normal.  Skin: Skin is warm and dry.  Psychiatric: He has a normal mood and affect. His behavior is normal. Judgment normal.  Nursing note and vitals reviewed.   ED Course  Procedures (including critical care time) Labs Review Labs Reviewed  CBC WITH DIFFERENTIAL/PLATELET - Abnormal; Notable for the following:    RBC 3.42 (*)    Hemoglobin 12.0 (*)    HCT 35.6 (*)    MCV 104.1 (*)    MCH 35.1 (*)    All other components within normal limits  COMPREHENSIVE METABOLIC PANEL - Abnormal; Notable for the following:    Potassium 3.1 (*)    Chloride 100 (*)    Glucose, Bld 188 (*)    BUN 21 (*)    Creatinine, Ser 1.34 (*)    GFR calc non Af Amer 47 (*)    GFR calc Af Amer 54 (*)    All other components within normal limits  LIPASE, BLOOD - Abnormal; Notable for the following:    Lipase 21 (*)    All other components within normal limits  URINALYSIS COMPLETEWITH MICROSCOPIC ([facility])   I-STAT TROPOININ, ED    Imaging Review No results found.   EKG Interpretation   Date/Time:  Tuesday May 05 2015 22:33:50 EDT Ventricular Rate:  115 PR Interval:  136 QRS Duration: 93 QT Interval:  327 QTC Calculation: 452 R Axis:   -24 Text Interpretation:  Sinus tachycardia Atrial premature complexes  Borderline left axis deviation Baseline wander in lead(s) II tachycardia  new since previous  Confirmed by YAO  MD, DAVID ([id number]) on 05/05/2015  11:07:31 PM      MDM   Final diagnoses:  Abdominal pain    [name] [name] is a 81 y.o. male here with ab pain. Will get CT to r/o recurrent tumor vs SBO. Will check labs and UA.   11:47 PM CT pending. Still in pain. Will give more pain meds. Plan to admit after CT resulted. Signed out to Dr. Sharol Given.     Wandra Arthurs, MD 05/05/15 [phone]

## 2015-05-06 ENCOUNTER — Telehealth: Payer: Self-pay | Admitting: Oncology

## 2015-05-06 ENCOUNTER — Inpatient Hospital Stay (HOSPITAL_COMMUNITY): Payer: Medicare Other

## 2015-05-06 ENCOUNTER — Encounter (HOSPITAL_COMMUNITY): Payer: Self-pay

## 2015-05-06 DIAGNOSIS — D649 Anemia, unspecified: Secondary | ICD-10-CM | POA: Diagnosis not present

## 2015-05-06 DIAGNOSIS — C762 Malignant neoplasm of abdomen: Secondary | ICD-10-CM

## 2015-05-06 DIAGNOSIS — A419 Sepsis, unspecified organism: Secondary | ICD-10-CM | POA: Diagnosis not present

## 2015-05-06 DIAGNOSIS — C494 Malignant neoplasm of connective and soft tissue of abdomen: Secondary | ICD-10-CM | POA: Diagnosis not present

## 2015-05-06 DIAGNOSIS — R1031 Right lower quadrant pain: Secondary | ICD-10-CM | POA: Diagnosis not present

## 2015-05-06 DIAGNOSIS — E860 Dehydration: Secondary | ICD-10-CM | POA: Diagnosis present

## 2015-05-06 DIAGNOSIS — Z79899 Other long term (current) drug therapy: Secondary | ICD-10-CM | POA: Diagnosis not present

## 2015-05-06 DIAGNOSIS — N183 Chronic kidney disease, stage 3 (moderate): Secondary | ICD-10-CM | POA: Diagnosis not present

## 2015-05-06 DIAGNOSIS — R6510 Systemic inflammatory response syndrome (SIRS) of non-infectious origin without acute organ dysfunction: Secondary | ICD-10-CM | POA: Diagnosis present

## 2015-05-06 DIAGNOSIS — K659 Peritonitis, unspecified: Secondary | ICD-10-CM | POA: Diagnosis not present

## 2015-05-06 DIAGNOSIS — K353 Acute appendicitis with localized peritonitis: Secondary | ICD-10-CM | POA: Diagnosis not present

## 2015-05-06 DIAGNOSIS — A047 Enterocolitis due to Clostridium difficile: Secondary | ICD-10-CM | POA: Diagnosis not present

## 2015-05-06 DIAGNOSIS — N433 Hydrocele, unspecified: Secondary | ICD-10-CM | POA: Diagnosis present

## 2015-05-06 DIAGNOSIS — Z789 Other specified health status: Secondary | ICD-10-CM | POA: Diagnosis not present

## 2015-05-06 DIAGNOSIS — C800 Disseminated malignant neoplasm, unspecified: Secondary | ICD-10-CM | POA: Diagnosis not present

## 2015-05-06 DIAGNOSIS — D630 Anemia in neoplastic disease: Secondary | ICD-10-CM | POA: Diagnosis not present

## 2015-05-06 DIAGNOSIS — K651 Peritoneal abscess: Secondary | ICD-10-CM | POA: Diagnosis not present

## 2015-05-06 DIAGNOSIS — E785 Hyperlipidemia, unspecified: Secondary | ICD-10-CM | POA: Diagnosis present

## 2015-05-06 DIAGNOSIS — C179 Malignant neoplasm of small intestine, unspecified: Secondary | ICD-10-CM

## 2015-05-06 DIAGNOSIS — D469 Myelodysplastic syndrome, unspecified: Secondary | ICD-10-CM | POA: Diagnosis not present

## 2015-05-06 DIAGNOSIS — R188 Other ascites: Secondary | ICD-10-CM | POA: Diagnosis present

## 2015-05-06 DIAGNOSIS — N179 Acute kidney failure, unspecified: Secondary | ICD-10-CM | POA: Diagnosis not present

## 2015-05-06 DIAGNOSIS — I252 Old myocardial infarction: Secondary | ICD-10-CM | POA: Diagnosis not present

## 2015-05-06 DIAGNOSIS — R339 Retention of urine, unspecified: Secondary | ICD-10-CM | POA: Diagnosis present

## 2015-05-06 DIAGNOSIS — R140 Abdominal distension (gaseous): Secondary | ICD-10-CM | POA: Diagnosis not present

## 2015-05-06 DIAGNOSIS — I129 Hypertensive chronic kidney disease with stage 1 through stage 4 chronic kidney disease, or unspecified chronic kidney disease: Secondary | ICD-10-CM | POA: Diagnosis present

## 2015-05-06 DIAGNOSIS — R109 Unspecified abdominal pain: Secondary | ICD-10-CM | POA: Diagnosis not present

## 2015-05-06 DIAGNOSIS — D72829 Elevated white blood cell count, unspecified: Secondary | ICD-10-CM | POA: Diagnosis present

## 2015-05-06 DIAGNOSIS — I5032 Chronic diastolic (congestive) heart failure: Secondary | ICD-10-CM | POA: Diagnosis present

## 2015-05-06 DIAGNOSIS — K5660 Unspecified intestinal obstruction: Secondary | ICD-10-CM | POA: Diagnosis not present

## 2015-05-06 DIAGNOSIS — K5900 Constipation, unspecified: Secondary | ICD-10-CM | POA: Diagnosis present

## 2015-05-06 DIAGNOSIS — I2510 Atherosclerotic heart disease of native coronary artery without angina pectoris: Secondary | ICD-10-CM | POA: Diagnosis present

## 2015-05-06 DIAGNOSIS — N508 Other specified disorders of male genital organs: Secondary | ICD-10-CM | POA: Diagnosis present

## 2015-05-06 DIAGNOSIS — C786 Secondary malignant neoplasm of retroperitoneum and peritoneum: Secondary | ICD-10-CM | POA: Diagnosis present

## 2015-05-06 DIAGNOSIS — R600 Localized edema: Secondary | ICD-10-CM | POA: Diagnosis not present

## 2015-05-06 DIAGNOSIS — K4090 Unilateral inguinal hernia, without obstruction or gangrene, not specified as recurrent: Secondary | ICD-10-CM | POA: Diagnosis not present

## 2015-05-06 LAB — URINALYSIS, ROUTINE W REFLEX MICROSCOPIC
Glucose, UA: NEGATIVE mg/dL
HGB URINE DIPSTICK: NEGATIVE
Ketones, ur: NEGATIVE mg/dL
LEUKOCYTES UA: NEGATIVE
NITRITE: NEGATIVE
Protein, ur: NEGATIVE mg/dL
Specific Gravity, Urine: 1.018 (ref 1.005–1.030)
UROBILINOGEN UA: 0.2 mg/dL (ref 0.0–1.0)
pH: 6 (ref 5.0–8.0)

## 2015-05-06 LAB — I-STAT CG4 LACTIC ACID, ED: Lactic Acid, Venous: 1.74 mmol/L (ref 0.5–2.0)

## 2015-05-06 LAB — PROTIME-INR
INR: 1.14 (ref 0.00–1.49)
PROTHROMBIN TIME: 14.7 s (ref 11.6–15.2)

## 2015-05-06 MED ORDER — POTASSIUM CHLORIDE IN NACL 40-0.9 MEQ/L-% IV SOLN
INTRAVENOUS | Status: DC
  Administered 2015-05-06 – 2015-05-10 (×7): 75 mL/h via INTRAVENOUS
  Administered 2015-05-10: 50 mL/h via INTRAVENOUS
  Administered 2015-05-12 – 2015-05-16 (×7): 75 mL/h via INTRAVENOUS
  Administered 2015-05-18: 10 mL/h via INTRAVENOUS
  Filled 2015-05-06 (×24): qty 1000

## 2015-05-06 MED ORDER — ACETAMINOPHEN 325 MG PO TABS: 650.0000 mg | ORAL_TABLET | Freq: Once | ORAL | Status: DC

## 2015-05-06 MED ORDER — METOPROLOL TARTRATE 25 MG PO TABS
25.0000 mg | ORAL_TABLET | Freq: Two times a day (BID) | ORAL | Status: DC
  Administered 2015-05-06 – 2015-05-20 (×30): 25 mg via ORAL
  Filled 2015-05-06 (×30): qty 1

## 2015-05-06 MED ORDER — ALUM & MAG HYDROXIDE-SIMETH 200-200-20 MG/5ML PO SUSP: 30.0000 mL | ORAL | Status: DC | PRN

## 2015-05-06 MED ORDER — HEPARIN SODIUM (PORCINE) 5000 UNIT/ML IJ SOLN: 5000.0000 [IU] | Freq: Three times a day (TID) | INTRAMUSCULAR | Status: DC

## 2015-05-06 MED ORDER — PIPERACILLIN-TAZOBACTAM 3.375 G IVPB
3.3750 g | Freq: Three times a day (TID) | INTRAVENOUS | Status: DC
  Administered 2015-05-06 – 2015-05-11 (×15): 3.375 g via INTRAVENOUS
  Filled 2015-05-06 (×16): qty 50

## 2015-05-06 MED ORDER — SODIUM CHLORIDE 0.9 % IV SOLN: INTRAVENOUS | Status: DC

## 2015-05-06 MED ORDER — SODIUM CHLORIDE 0.9 % IV SOLN
INTRAVENOUS | Status: DC
  Administered 2015-05-06: 05:00:00 via INTRAVENOUS

## 2015-05-06 MED ORDER — VANCOMYCIN HCL IN DEXTROSE 1-5 GM/200ML-% IV SOLN
1000.0000 mg | Freq: Once | INTRAVENOUS | Status: AC
  Administered 2015-05-06: 1000 mg via INTRAVENOUS
  Filled 2015-05-06: qty 200

## 2015-05-06 MED ORDER — SIMVASTATIN 20 MG PO TABS
20.0000 mg | ORAL_TABLET | Freq: Every evening | ORAL | Status: DC
  Administered 2015-05-06 – 2015-05-19 (×14): 20 mg via ORAL
  Filled 2015-05-06 (×14): qty 1

## 2015-05-06 MED ORDER — ONDANSETRON HCL 4 MG/2ML IJ SOLN
4.0000 mg | Freq: Four times a day (QID) | INTRAMUSCULAR | Status: DC | PRN
Start: 2015-05-06 — End: 2015-05-20

## 2015-05-06 MED ORDER — SODIUM CHLORIDE 0.9 % IJ SOLN
3.0000 mL | Freq: Two times a day (BID) | INTRAMUSCULAR | Status: DC
  Administered 2015-05-06 – 2015-05-20 (×9): 3 mL via INTRAVENOUS

## 2015-05-06 MED ORDER — SODIUM CHLORIDE 0.9 % IV BOLUS (SEPSIS)
1000.0000 mL | Freq: Once | INTRAVENOUS | Status: AC
  Administered 2015-05-06: 1000 mL via INTRAVENOUS

## 2015-05-06 MED ORDER — ACETAMINOPHEN 325 MG PO TABS
650.0000 mg | ORAL_TABLET | Freq: Four times a day (QID) | ORAL | Status: DC | PRN
  Administered 2015-05-09: 650 mg via ORAL
  Filled 2015-05-06: qty 2

## 2015-05-06 MED ORDER — HYDROMORPHONE HCL 1 MG/ML IJ SOLN
1.0000 mg | INTRAMUSCULAR | Status: DC | PRN
  Administered 2015-05-06 – 2015-05-16 (×32): 1 mg via INTRAVENOUS
  Filled 2015-05-06 (×32): qty 1

## 2015-05-06 MED ORDER — AMLODIPINE BESYLATE 5 MG PO TABS
5.0000 mg | ORAL_TABLET | Freq: Every day | ORAL | Status: DC
Start: 2015-05-06 — End: 2015-05-20
  Administered 2015-05-06 – 2015-05-20 (×15): 5 mg via ORAL
  Filled 2015-05-06 (×15): qty 1

## 2015-05-06 MED ORDER — HYDROMORPHONE HCL 1 MG/ML IJ SOLN
1.0000 mg | Freq: Once | INTRAMUSCULAR | Status: AC
  Administered 2015-05-06: 1 mg via INTRAVENOUS
  Filled 2015-05-06: qty 1

## 2015-05-06 MED ORDER — PIPERACILLIN-TAZOBACTAM 3.375 G IVPB
3.3750 g | Freq: Once | INTRAVENOUS | Status: AC
  Administered 2015-05-06: 3.375 g via INTRAVENOUS
  Filled 2015-05-06 (×2): qty 50

## 2015-05-06 NOTE — Progress Notes (Signed)
Patient ID: [name] [name], male   DOB: Jun 27, 1929, 81 y.o.   MRN: [id number] Patient presented to ultrasound department today for paracentesis. On limited ultrasound of abdomen in all 4 quadrants there is only trace amount of ascites present.  Paracentesis was canceled. Patient notified.

## 2015-05-06 NOTE — H&P (Addendum)
[facility] History and Physical  Xavi Tomasik TOI:[id number] DOB: 1929/12/13 DOA: 05/05/2015  Referring physician: EDP PCP: Abigail Miyamoto, MD   Chief Complaint: Abdominal pain   HPI: [name] [name] is a 81 y.o. male with h/o GIST tumor, peritoneal carcinomatosis, being treated with [place].  Patient presents to ED with development of abdominal pain.  Pain started in his RLQ then radiated up to RUQ and LUQ.  Having nausea but no vomiting.  Symptoms onset earlier today.  Had similar symptoms a couple of months ago, came to ED, and improved with pain control and hydration.  Review of Systems: Systems reviewed.  As above, otherwise negative  Past Medical History  Diagnosis Date  . Hyperlipidemia     TAKES zOCOR DAILY  . Abdominal mass 02/2013  . UTI (urinary tract infection)   . Hypertension     TAKES LOTREL,HCTZ,AND METOPROLOL DAILY  . NSTEMI (non-ST elevated myocardial infarction) 02/2013    "LIGHT: ONE MD SAID HE DID AND ONE SAID HE DIDN'T  . Pneumonia     MAR 2014  . Stromal tumor of digestive system   . CAD (coronary artery disease)   . Shortness of breath   . Acute respiratory failure with hypoxia 09/12/2014   Past Surgical History  Procedure Laterality Date  . Cyst excision  1962    wrist  . Cardiac catheterization  03-05-13  . Laparotomy N/A 06/04/2013    Procedure: EXPLORATORY LAPAROTOMY, OPEN RESECTION OF MESENTERIC AND INTESTINAL MASS;  Surgeon: [name], MD;  Location: [facility];  Service: General;  Laterality: N/A;  Small Bowel Resection  . Colonoscopy  09/08/2014    dr scooler  . Left heart catheterization with coronary angiogram N/A 03/05/2013    Procedure: LEFT HEART CATHETERIZATION WITH CORONARY ANGIOGRAM;  Surgeon: [name], MD;  Location: [facility] CATH LAB;  Service: Cardiovascular;  Laterality: N/A;   Social History:  reports that he has never smoked. He has never used smokeless tobacco. He reports that he does not drink alcohol or use illicit drugs.  No  Known Allergies  Family History  Problem Relation Age of Onset  . Skin cancer Brother      Prior to Admission medications   Medication Sig Start Date End Date Taking? Authorizing Provider  amLODipine (NORVASC) 5 MG tablet Take 5 mg by mouth daily. 03/17/15  Yes Historical Provider, MD  hydrochlorothiazide (HYDRODIURIL) 25 MG tablet Take 25 mg by mouth daily.   Yes Historical Provider, MD  imatinib (GLEEVEC) 400 MG tablet Take 1 tablet (400 mg total) by mouth daily. Take with meals and large glass of water.Caution:Chemotherapy. 02/13/15  Yes Ladell Pier, MD  metoprolol tartrate (LOPRESSOR) 25 MG tablet Take 1 tablet (25 mg total) by mouth 2 (two) times daily. 03/06/13  Yes Costin Karlyne Greenspan, MD  simvastatin (ZOCOR) 20 MG tablet Take 20 mg by mouth every evening.   Yes Historical Provider, MD   Physical Exam: Filed Vitals:   05/06/15 0230  BP: 135/66  Pulse: 113  Temp:   Resp: 20    BP 135/66 mmHg  Pulse 113  Temp(Src) 100.1 F (37.8 C) (Oral)  Resp 20  SpO2 89%  General Appearance:    Alert, oriented, no distress, appears stated age  Head:    Normocephalic, atraumatic  Eyes:    PERRL, EOMI, sclera non-icteric        Nose:   Nares without drainage or epistaxis. Mucosa, turbinates normal  Throat:   Moist mucous membranes. Oropharynx  without erythema or exudate.  Neck:   Supple. No carotid bruits.  No thyromegaly.  No lymphadenopathy.   Back:     No CVA tenderness, no spinal tenderness  Lungs:     Clear to auscultation bilaterally, without wheezes, rhonchi or rales  Chest wall:    No tenderness to palpitation  Heart:    Regular rate and rhythm without murmurs, gallops, rubs  Abdomen:     Soft, mild distention, tenderness in RLQ, and tracking up via his RUQ to his epigastric area, normal bowel sounds, no organomegaly  Genitalia:    deferred  Rectal:    deferred  Extremities:   No clubbing, cyanosis or edema.  Pulses:   2+ and symmetric all extremities  Skin:   Skin color,  texture, turgor normal, no rashes or lesions  Lymph nodes:   Cervical, supraclavicular, and axillary nodes normal  Neurologic:   CNII-XII intact. Normal strength, sensation and reflexes      throughout    Labs on Admission:  Basic Metabolic Panel:  Recent Labs Lab 05/05/15 2137  NA 139  K 3.1*  CL 100*  CO2 27  GLUCOSE 188*  BUN 21*  CREATININE 1.34*  CALCIUM 8.9   Liver Function Tests:  Recent Labs Lab 05/05/15 2137  AST 26  ALT 27  ALKPHOS 84  BILITOT 1.0  PROT 7.1  ALBUMIN 4.1    Recent Labs Lab 05/05/15 2137  LIPASE 21*   No results for input(s): AMMONIA in the last 168 hours. CBC:  Recent Labs Lab 05/05/15 2137  WBC 7.3  NEUTROABS 5.6  HGB 12.0*  HCT 35.6*  MCV 104.1*  PLT 236   Cardiac Enzymes: No results for input(s): CKTOTAL, CKMB, CKMBINDEX, TROPONINI in the last 168 hours.  BNP (last 3 results)  Recent Labs  09/12/14 0445  PROBNP 5726.0*   CBG: No results for input(s): GLUCAP in the last 168 hours.  Radiological Exams on Admission: Ct Abdomen Pelvis Wo Contrast  05/06/2015   CLINICAL DATA:  Mid right-sided abdominal pain, onset tonight. Nausea without vomiting. History of GI stromal tumor of small bowel.  EXAM: CT ABDOMEN AND PELVIS WITHOUT CONTRAST  TECHNIQUE: Multidetector CT imaging of the abdomen and pelvis was performed following the standard protocol without IV contrast.  COMPARISON:  CT 01/13/2015  FINDINGS: Resolution of previous left pleural effusion. Depending ground-glass opacity in the right lower lobe consistent with atelectasis. The heart is enlarged.  Lack of IV contrast limits assessment for focal hepatic lesion, allowing for this, no focal lesion is seen. Gallstones are seen within the gallbladder which is physiologically distended. Small amount perihepatic ascites, the degree of which is slightly increased from prior exam. Spleen is normal in size, development of small volume of perisplenic ascites.  Mesenteric and omental  edema and ill-defined soft tissue omental densities consistent with known peritoneal disease. Soft tissue nodule in the midline upper abdomen measures 2.8 x 2.2 cm, previously 3.5 x 2.3 cm.  Small and large bowel are suboptimally assessed given lack contrast and haziness throughout the abdomen. The stomach through is distended with fluid, small amount of fluid in the distal esophagus is small hiatal hernia. Enteric anastomosis in the mid to left abdomen. Bowel loops in the right lower quadrant of the abdomen are suboptimally defined. There is hyperattenuation within a tubular structure in the right lower quadrant that may reflect the appendix, however neoplasm is not entirely excluded. There is increased soft tissue density/fluid and edema about this structure which may  reflect increased ascites. Edema and fluid tracks in the right inguinal canal with possible peritoneal implant in the inguinal canal. There are multiple colonic diverticula containing retained barium from prior exams.  No adrenal nodule. Pancreas is normal. There is no hydronephrosis, kidneys are symmetric in size. Questionable soft tissue density in the region of the porta, may reflect periportal lymph nodes.  Dense atherosclerosis of the abdominal aorta without aneurysm. Small retroperitoneal soft tissue densities.  In the pelvis the bladder is physiologically distended. Prostate gland is normal in size. Small amount pelvic ascites.  No lytic or blastic osseous lesions. Degenerative change in the spine.  IMPRESSION: 1. Increased intra-abdominal ascites in the right and left upper quadrant and right lower quadrant, the degree of which remains mild. There is increased peritoneal and omental edema of the right lower quadrant. This edema and soft tissue density tracks into the right inguinal canal which may reflect progression of known metastatic disease, versus decompressed small bowel tracking in the right inguinal hernia. No proximal small bowel  dilatation to suggest obstruction. 2. There is however interval improvement in size of dominant omental nodule in the anterior upper abdomen from prior. 3. Tubular structure in the right lower quadrant containing high-density. This high-density was seen on prior exam, it is unclear whether this represents the appendix with retained barium versus related to neoplasm or metastatic disease. There is overall increase in the fluid and soft tissue in the right lower quadrant, in addition adjacent to these tubular structure, however the appearance does not favor that of appendicitis. 4. Cholelithiasis without findings of acute cholecystitis. Diverticulosis without diverticulitis.   Electronically Signed   By: Jeb Levering M.D.   On: 05/06/2015 00:17    EKG: Independently reviewed.  Assessment/Plan Principal Problem:   SIRS (systemic inflammatory response syndrome) Active Problems:   GIST (gastrointestinal stromal tumor) of small bowel, malignant   Hypertension   Abdominal carcinomatosis   Abdominal pain   1. SIRS - with abdominal pain, clinical picture is suspicious for possible SBP in the ascitic fluid, tenderness seems to follow the areas of ascites seen on CT scan 1. Dilaudid PRN pain control 2. IVF - got 3L in ED, will hydrate very gently for now given history of diastolic CHF 3. Tele monitor for tachycardia 4. Tylenol PRN fever 5. Clear liquid diet 6. Empiric zosyn 7. Blood cultures 8. IR paracentesis ordered for cultures, not sure if they will be able to tap anything though given "mild" ascites. 2. GIST - with abdominal carcinomatosis 1. Repeat CT: Unclear wether tumor burden worse or better (large peritoneal nodule is smaller, but has increased edema which may be tumor burden or may be due to infection). 2. EDP spoke with oncology, they recommended empiric broad spectrum ABx and they will see patient in AM. 3. HTN - continue home meds, hold HCTZ 4. Chronic diastolic CHF    Code  Status: Full Code  Family Communication: Family at bedside Disposition Plan: Admit to inpatient   Time spent: [place], [place] [facility] Pager [phone]  If 7AM-7PM, please contact the day team taking care of the patient [email or web address] Password [email or web address] 05/06/2015, 3:10 AM

## 2015-05-06 NOTE — Progress Notes (Signed)
ANTIBIOTIC CONSULT NOTE - INITIAL  Pharmacy Consult for zosyn Indication: Intra-abdominal infection  No Known Allergies  Patient Measurements: Height: 5\' 7"  (170.2 cm) Weight: 161 lb 13.1 oz (73.4 kg) IBW/kg (Calculated) : 66.1 Adjusted Body Weight:   Vital Signs: Temp: 98.2 F (36.8 C) (05/18 0419) Temp Source: Oral (05/18 0419) BP: 131/59 mmHg (05/18 0419) Pulse Rate: 108 (05/18 0419) Intake/Output from previous day:   Intake/Output from this shift:    Labs:  Recent Labs  05/05/15 2137  WBC 7.3  HGB 12.0*  PLT 236  CREATININE 1.34*   Estimated Creatinine Clearance: 38.4 mL/min (by C-G formula based on Cr of 1.34). No results for input(s): VANCOTROUGH, VANCOPEAK, VANCORANDOM, GENTTROUGH, GENTPEAK, GENTRANDOM, TOBRATROUGH, TOBRAPEAK, TOBRARND, AMIKACINPEAK, AMIKACINTROU, AMIKACIN in the last 72 hours.   Microbiology: No results found for this or any previous visit (from the past 720 hour(s)).  Medical History: Past Medical History  Diagnosis Date  . Hyperlipidemia     TAKES zOCOR DAILY  . Abdominal mass 02/2013  . UTI (urinary tract infection)   . Hypertension     TAKES LOTREL,HCTZ,AND METOPROLOL DAILY  . NSTEMI (non-ST elevated myocardial infarction) 02/2013    "LIGHT: ONE MD SAID HE DID AND ONE SAID HE DIDN'T  . Pneumonia     MAR 2014  . Stromal tumor of digestive system   . CAD (coronary artery disease)   . Shortness of breath   . Acute respiratory failure with hypoxia 09/12/2014    Medications:  Anti-infectives    Start     Dose/Rate Route Frequency Ordered Stop   05/06/15 1400  piperacillin-tazobactam (ZOSYN) IVPB 3.375 g     3.375 g 12.5 mL/hr over 240 Minutes Intravenous 3 times per day 05/06/15 0535     05/06/15 0300  vancomycin (VANCOCIN) IVPB 1000 mg/200 mL premix     1,000 mg 200 mL/hr over 60 Minutes Intravenous  Once 05/06/15 0247 05/06/15 0419   05/06/15 0245  piperacillin-tazobactam (ZOSYN) IVPB 3.375 g     3.375 g 12.5 mL/hr over 240  Minutes Intravenous  Once 05/06/15 0247       Assessment: Patient with abdominal pain and SIRS.  Pharmacy to dose zosyn for intra-abdominal infection.  Patient with reduced renal function.  Goal of Therapy:  Zosyn based on renal function   Plan:  Follow up culture results  Zosyn 3.375g IV Q8H infused over 4hrs.   Tyler Deis, Shea Stakes [place] 05/06/2015,5:35 AM

## 2015-05-06 NOTE — ED Provider Notes (Signed)
Care assumed at change of shift from Dr Darl Householder.  Pt with h/o GIST tumor, on Gleevec.  He has had worsening abdominal pain with nausea.  He has been tachycardic, now with low grade fever.  Awaiting CT scan.    Results for orders placed or performed during the hospital encounter of 05/05/15  CBC with Differential  Result Value Ref Range   WBC 7.3 4.0 - 10.5 K/uL   RBC 3.42 (L) 4.22 - 5.81 MIL/uL   Hemoglobin 12.0 (L) 13.0 - 17.0 g/dL   HCT 35.6 (L) 39.0 - 52.0 %   MCV 104.1 (H) 78.0 - 100.0 fL   MCH 35.1 (H) 26.0 - 34.0 pg   MCHC 33.7 30.0 - 36.0 g/dL   RDW 12.5 11.5 - 15.5 %   Platelets 236 150 - 400 K/uL   Neutrophils Relative % 75 43 - 77 %   Lymphocytes Relative 17 12 - 46 %   Monocytes Relative 6 3 - 12 %   Eosinophils Relative 2 0 - 5 %   Basophils Relative 0 0 - 1 %   Neutro Abs 5.6 1.7 - 7.7 K/uL   Lymphs Abs 1.2 0.7 - 4.0 K/uL   Monocytes Absolute 0.4 0.1 - 1.0 K/uL   Eosinophils Absolute 0.1 0.0 - 0.7 K/uL   Basophils Absolute 0.0 0.0 - 0.1 K/uL   Smear Review MORPHOLOGY UNREMARKABLE   Comprehensive metabolic panel  Result Value Ref Range   Sodium 139 135 - 145 mmol/L   Potassium 3.1 (L) 3.5 - 5.1 mmol/L   Chloride 100 (L) 101 - 111 mmol/L   CO2 27 22 - 32 mmol/L   Glucose, Bld 188 (H) 65 - 99 mg/dL   BUN 21 (H) 6 - 20 mg/dL   Creatinine, Ser 1.34 (H) 0.61 - 1.24 mg/dL   Calcium 8.9 8.9 - 10.3 mg/dL   Total Protein 7.1 6.5 - 8.1 g/dL   Albumin 4.1 3.5 - 5.0 g/dL   AST 26 15 - 41 U/L   ALT 27 17 - 63 U/L   Alkaline Phosphatase 84 38 - 126 U/L   Total Bilirubin 1.0 0.3 - 1.2 mg/dL   GFR calc non Af Amer 47 (L) >60 mL/min   GFR calc Af Amer 54 (L) >60 mL/min   Anion gap 12 5 - 15  Lipase, blood  Result Value Ref Range   Lipase 21 (L) 22 - 51 U/L  Urinalysis, Routine w reflex microscopic  Result Value Ref Range   Color, Urine YELLOW YELLOW   APPearance CLEAR CLEAR   Specific Gravity, Urine 1.018 1.005 - 1.030   pH 6.0 5.0 - 8.0   Glucose, UA NEGATIVE NEGATIVE  mg/dL   Hgb urine dipstick NEGATIVE NEGATIVE   Bilirubin Urine SMALL (A) NEGATIVE   Ketones, ur NEGATIVE NEGATIVE mg/dL   Protein, ur NEGATIVE NEGATIVE mg/dL   Urobilinogen, UA 0.2 0.0 - 1.0 mg/dL   Nitrite NEGATIVE NEGATIVE   Leukocytes, UA NEGATIVE NEGATIVE  I-Stat Troponin, ED (not at [facility])  Result Value Ref Range   Troponin i, poc 0.01 0.00 - 0.08 ng/mL   Comment 3          I-Stat CG4 Lactic Acid, ED  Result Value Ref Range   Lactic Acid, Venous 1.74 0.5 - 2.0 mmol/L   Ct Abdomen Pelvis Wo Contrast  05/06/2015   CLINICAL DATA:  Mid right-sided abdominal pain, onset tonight. Nausea without vomiting. History of GI stromal tumor of small bowel.  EXAM: CT ABDOMEN  AND PELVIS WITHOUT CONTRAST  TECHNIQUE: Multidetector CT imaging of the abdomen and pelvis was performed following the standard protocol without IV contrast.  COMPARISON:  CT 01/13/2015  FINDINGS: Resolution of previous left pleural effusion. Depending ground-glass opacity in the right lower lobe consistent with atelectasis. The heart is enlarged.  Lack of IV contrast limits assessment for focal hepatic lesion, allowing for this, no focal lesion is seen. Gallstones are seen within the gallbladder which is physiologically distended. Small amount perihepatic ascites, the degree of which is slightly increased from prior exam. Spleen is normal in size, development of small volume of perisplenic ascites.  Mesenteric and omental edema and ill-defined soft tissue omental densities consistent with known peritoneal disease. Soft tissue nodule in the midline upper abdomen measures 2.8 x 2.2 cm, previously 3.5 x 2.3 cm.  Small and large bowel are suboptimally assessed given lack contrast and haziness throughout the abdomen. The stomach through is distended with fluid, small amount of fluid in the distal esophagus is small hiatal hernia. Enteric anastomosis in the mid to left abdomen. Bowel loops in the right lower quadrant of the abdomen are  suboptimally defined. There is hyperattenuation within a tubular structure in the right lower quadrant that may reflect the appendix, however neoplasm is not entirely excluded. There is increased soft tissue density/fluid and edema about this structure which may reflect increased ascites. Edema and fluid tracks in the right inguinal canal with possible peritoneal implant in the inguinal canal. There are multiple colonic diverticula containing retained barium from prior exams.  No adrenal nodule. Pancreas is normal. There is no hydronephrosis, kidneys are symmetric in size. Questionable soft tissue density in the region of the porta, may reflect periportal lymph nodes.  Dense atherosclerosis of the abdominal aorta without aneurysm. Small retroperitoneal soft tissue densities.  In the pelvis the bladder is physiologically distended. Prostate gland is normal in size. Small amount pelvic ascites.  No lytic or blastic osseous lesions. Degenerative change in the spine.  IMPRESSION: 1. Increased intra-abdominal ascites in the right and left upper quadrant and right lower quadrant, the degree of which remains mild. There is increased peritoneal and omental edema of the right lower quadrant. This edema and soft tissue density tracks into the right inguinal canal which may reflect progression of known metastatic disease, versus decompressed small bowel tracking in the right inguinal hernia. No proximal small bowel dilatation to suggest obstruction. 2. There is however interval improvement in size of dominant omental nodule in the anterior upper abdomen from prior. 3. Tubular structure in the right lower quadrant containing high-density. This high-density was seen on prior exam, it is unclear whether this represents the appendix with retained barium versus related to neoplasm or metastatic disease. There is overall increase in the fluid and soft tissue in the right lower quadrant, in addition adjacent to these tubular  structure, however the appearance does not favor that of appendicitis. 4. Cholelithiasis without findings of acute cholecystitis. Diverticulosis without diverticulitis.   Electronically Signed   By: Jeb Levering M.D.   On: 05/06/2015 00:17    Contacted by radiology regarding mass in right inguinal region.  Pt examined, has known hernia, reducible at this time.  Awaiting urine to evaluate for fever source.  Pt without improvement after morphine, to receive dilaudid.  2:19 AM Pt without signs of UTI.  Will d/c hospitalist for admission for pain control, monitoring for tachycardia, fever.  Linton Flemings, MD 05/06/15 [id number]

## 2015-05-06 NOTE — Telephone Encounter (Signed)
Pt sons came in to notify that pt is in the hospital. They cx appt for 5.19....i sent msg to Dr. [name]

## 2015-05-06 NOTE — Progress Notes (Signed)
Progress Note   Duayne Brideau NTZ:[id number] DOB: 21-Oct-1929 DOA: 05/05/2015 PCP: Abigail Miyamoto, MD   Brief Narrative:   [name] [name] is an 81 y.o. male with h/o GIST tumor, peritoneal carcinomatosis, being treated with [place] who was admitted 05/05/15 with a chief complaint of abdominal pain associated with nausea.  Assessment/Plan:   Principal Problem:   SIRS (systemic inflammatory response syndrome) in the setting of abdominal carcinomatosis with associated abdominal pain - Status post vigorous hydration in the ED. - Follow-up blood cultures. - IR consulted for paracentesis to rule out SBP.  Only trace ascites, so could not be done. - Continue bowel rest/clear liquid diet. Advance as tolerated. - Continue pain control efforts with Dilaudid.  Active Problems:   GIST (gastrointestinal stromal tumor) of small bowel, malignant - Oncology consultation pending.    Hypertension - Continue metoprolol. Hydrochlorothiazide on hold.    DVT Prophylaxis - SCDs ordered.  Code Status: Full. Family Communication: Wife and son at the bedside. Disposition Plan: Home when stable.   IV Access:    Peripheral IV   Procedures and diagnostic studies:   Ct Abdomen Pelvis Wo Contrast  05/06/2015   CLINICAL DATA:  Mid right-sided abdominal pain, onset tonight. Nausea without vomiting. History of GI stromal tumor of small bowel.  EXAM: CT ABDOMEN AND PELVIS WITHOUT CONTRAST  TECHNIQUE: Multidetector CT imaging of the abdomen and pelvis was performed following the standard protocol without IV contrast.  COMPARISON:  CT 01/13/2015  FINDINGS: Resolution of previous left pleural effusion. Depending ground-glass opacity in the right lower lobe consistent with atelectasis. The heart is enlarged.  Lack of IV contrast limits assessment for focal hepatic lesion, allowing for this, no focal lesion is seen. Gallstones are seen within the gallbladder which is physiologically distended. Small amount  perihepatic ascites, the degree of which is slightly increased from prior exam. Spleen is normal in size, development of small volume of perisplenic ascites.  Mesenteric and omental edema and ill-defined soft tissue omental densities consistent with known peritoneal disease. Soft tissue nodule in the midline upper abdomen measures 2.8 x 2.2 cm, previously 3.5 x 2.3 cm.  Small and large bowel are suboptimally assessed given lack contrast and haziness throughout the abdomen. The stomach through is distended with fluid, small amount of fluid in the distal esophagus is small hiatal hernia. Enteric anastomosis in the mid to left abdomen. Bowel loops in the right lower quadrant of the abdomen are suboptimally defined. There is hyperattenuation within a tubular structure in the right lower quadrant that may reflect the appendix, however neoplasm is not entirely excluded. There is increased soft tissue density/fluid and edema about this structure which may reflect increased ascites. Edema and fluid tracks in the right inguinal canal with possible peritoneal implant in the inguinal canal. There are multiple colonic diverticula containing retained barium from prior exams.  No adrenal nodule. Pancreas is normal. There is no hydronephrosis, kidneys are symmetric in size. Questionable soft tissue density in the region of the porta, may reflect periportal lymph nodes.  Dense atherosclerosis of the abdominal aorta without aneurysm. Small retroperitoneal soft tissue densities.  In the pelvis the bladder is physiologically distended. Prostate gland is normal in size. Small amount pelvic ascites.  No lytic or blastic osseous lesions. Degenerative change in the spine.  IMPRESSION: 1. Increased intra-abdominal ascites in the right and left upper quadrant and right lower quadrant, the degree of which remains mild. There is increased peritoneal and omental edema of the right  lower quadrant. This edema and soft tissue density tracks into  the right inguinal canal which may reflect progression of known metastatic disease, versus decompressed small bowel tracking in the right inguinal hernia. No proximal small bowel dilatation to suggest obstruction. 2. There is however interval improvement in size of dominant omental nodule in the anterior upper abdomen from prior. 3. Tubular structure in the right lower quadrant containing high-density. This high-density was seen on prior exam, it is unclear whether this represents the appendix with retained barium versus related to neoplasm or metastatic disease. There is overall increase in the fluid and soft tissue in the right lower quadrant, in addition adjacent to these tubular structure, however the appearance does not favor that of appendicitis. 4. Cholelithiasis without findings of acute cholecystitis. Diverticulosis without diverticulitis.   Electronically Signed   By: Jeb Levering M.D.   On: 05/06/2015 00:17     Medical Consultants:    Oncology  Anti-Infectives:    Zosyn 05/05/15--->  Subjective:   [name] [name] reports mid abdominal pain, constant rated 7/10, associated with nausea but not vomiting.  No flatus.  Moved his bowels 2 days ago.  Objective:    Filed Vitals:   05/06/15 0130 05/06/15 0200 05/06/15 0230 05/06/15 0419  BP: 145/52 133/60 135/66 131/59  Pulse: 113 114 113 108  Temp:    98.2 F (36.8 C)  TempSrc:    Oral  Resp: 21 21 20 18   Height:    5\' 7"  (1.702 m)  Weight:    73.4 kg (161 lb 13.1 oz)  SpO2: 89% 91% 89% 91%    Intake/Output Summary (Last 24 hours) at 05/06/15 7672 Last data filed at 05/06/15 0600  Gross per 24 hour  Intake 358.75 ml  Output      0 ml  Net 358.75 ml    Exam: Gen:  NAD, weak Cardiovascular:  Tachy, No M/R/G Respiratory:  Lungs CTAB Gastrointestinal:  Abdomen firm, decreased BS Extremities:  No C/E/C   Data Reviewed:    Labs: Basic Metabolic Panel:  Recent Labs Lab 05/05/15 2137  NA 139  K 3.1*  CL 100*    CO2 27  GLUCOSE 188*  BUN 21*  CREATININE 1.34*  CALCIUM 8.9   GFR Estimated Creatinine Clearance: 38.4 mL/min (by C-G formula based on Cr of 1.34). Liver Function Tests:  Recent Labs Lab 05/05/15 2137  AST 26  ALT 27  ALKPHOS 84  BILITOT 1.0  PROT 7.1  ALBUMIN 4.1    Recent Labs Lab 05/05/15 2137  LIPASE 21*   Coagulation profile  Recent Labs Lab 05/06/15 0304  INR 1.14    CBC:  Recent Labs Lab 05/05/15 2137  WBC 7.3  NEUTROABS 5.6  HGB 12.0*  HCT 35.6*  MCV 104.1*  PLT 236   BNP (last 3 results)  Recent Labs  09/12/14 0445  PROBNP 5726.0*   Sepsis Labs:  Recent Labs Lab 05/05/15 2137 05/06/15 0033  WBC 7.3  --   LATICACIDVEN  --  1.74   Microbiology No results found for this or any previous visit (from the past 240 hour(s)).   Medications:   . amLODipine  5 mg Oral Daily  . metoprolol tartrate  25 mg Oral BID  . piperacillin-tazobactam (ZOSYN)  IV  3.375 g Intravenous 3 times per day  . simvastatin  20 mg Oral QPM  . sodium chloride  3 mL Intravenous Q12H   Continuous Infusions: . sodium chloride 75 mL/hr at 05/06/15 [phone]  Time spent: 30 minutes.   LOS: 0 days   RAMA,CHRISTINA  [facility] Pager [phone]. If unable to reach me by pager, please call my cell phone at [phone].  *Please refer to [email or web address], password [email or web address] to get updated schedule on who will round on this patient, as hospitalists switch teams weekly. If 7PM-7AM, please contact night-coverage at [email or web address], password [email or web address] for any overnight needs.  05/06/2015, 9:25 AM

## 2015-05-06 NOTE — Progress Notes (Signed)
CSW received consult in Epic stating Other (see comments). No comments in comment section identifying social work need. CSW reviewed notes in Epic and no notes indicate social work need. Pt was discussed in 4 [place] progression rounds and unit RN was unaware of any social work needs.   CSW will continue to follow pt progress and complete psychosocial assessment if social work needs arise.   Alison Murray, MSW, [place] [facility] [phone]

## 2015-05-07 ENCOUNTER — Ambulatory Visit: Payer: Medicare Other | Admitting: Nurse Practitioner

## 2015-05-07 ENCOUNTER — Inpatient Hospital Stay (HOSPITAL_COMMUNITY): Payer: Medicare Other

## 2015-05-07 ENCOUNTER — Encounter (HOSPITAL_COMMUNITY): Payer: Self-pay | Admitting: Internal Medicine

## 2015-05-07 ENCOUNTER — Other Ambulatory Visit: Payer: Medicare Other

## 2015-05-07 DIAGNOSIS — D630 Anemia in neoplastic disease: Secondary | ICD-10-CM

## 2015-05-07 DIAGNOSIS — R339 Retention of urine, unspecified: Secondary | ICD-10-CM | POA: Diagnosis present

## 2015-05-07 DIAGNOSIS — D469 Myelodysplastic syndrome, unspecified: Secondary | ICD-10-CM

## 2015-05-07 DIAGNOSIS — C494 Malignant neoplasm of connective and soft tissue of abdomen: Secondary | ICD-10-CM

## 2015-05-07 DIAGNOSIS — N183 Chronic kidney disease, stage 3 unspecified: Secondary | ICD-10-CM | POA: Diagnosis present

## 2015-05-07 DIAGNOSIS — R1013 Epigastric pain: Secondary | ICD-10-CM

## 2015-05-07 DIAGNOSIS — E785 Hyperlipidemia, unspecified: Secondary | ICD-10-CM

## 2015-05-07 DIAGNOSIS — I10 Essential (primary) hypertension: Secondary | ICD-10-CM

## 2015-05-07 DIAGNOSIS — N179 Acute kidney failure, unspecified: Secondary | ICD-10-CM

## 2015-05-07 DIAGNOSIS — R109 Unspecified abdominal pain: Secondary | ICD-10-CM

## 2015-05-07 LAB — BASIC METABOLIC PANEL
ANION GAP: 8 (ref 5–15)
BUN: 32 mg/dL — ABNORMAL HIGH (ref 6–20)
CHLORIDE: 106 mmol/L (ref 101–111)
CO2: 25 mmol/L (ref 22–32)
Calcium: 7.7 mg/dL — ABNORMAL LOW (ref 8.9–10.3)
Creatinine, Ser: 1.23 mg/dL (ref 0.61–1.24)
GFR calc non Af Amer: 52 mL/min — ABNORMAL LOW (ref >60)
Glucose, Bld: 144 mg/dL — ABNORMAL HIGH (ref 65–99)
Potassium: 4.2 mmol/L (ref 3.5–5.1)
Sodium: 139 mmol/L (ref 135–145)

## 2015-05-07 LAB — CBC
HCT: 28.5 % — ABNORMAL LOW (ref 39.0–52.0)
Hemoglobin: 9.5 g/dL — ABNORMAL LOW (ref 13.0–17.0)
MCH: 35.1 pg — ABNORMAL HIGH (ref 26.0–34.0)
MCHC: 33.3 g/dL (ref 30.0–36.0)
MCV: 105.2 fL — ABNORMAL HIGH (ref 78.0–100.0)
PLATELETS: 158 10*3/uL (ref 150–400)
RBC: 2.71 MIL/uL — AB (ref 4.22–5.81)
RDW: 12.9 % (ref 11.5–15.5)
WBC: 10.4 10*3/uL (ref 4.0–10.5)

## 2015-05-07 LAB — URINE CULTURE
CULTURE: NO GROWTH
Colony Count: NO GROWTH

## 2015-05-07 MED ORDER — CIPROFLOXACIN IN D5W 400 MG/200ML IV SOLN: 400.0000 mg | Freq: Two times a day (BID) | INTRAVENOUS | Status: DC

## 2015-05-07 MED ORDER — METRONIDAZOLE IN NACL 5-0.79 MG/ML-% IV SOLN: 500.0000 mg | Freq: Three times a day (TID) | INTRAVENOUS | Status: DC

## 2015-05-07 MED ORDER — IMATINIB MESYLATE 100 MG PO TABS
400.0000 mg | ORAL_TABLET | Freq: Every day | ORAL | Status: DC
  Administered 2015-05-08 – 2015-05-12 (×5): 400 mg via ORAL
  Filled 2015-05-07 (×7): qty 4

## 2015-05-07 NOTE — Progress Notes (Signed)
Patient complain of Bladder pressure. Patient has no urine output since the last in and out cath yesterday evening. Bladder scan showed 433ml. RN did In and out. 500 ml was drained. Will continue to monitor.

## 2015-05-07 NOTE — Progress Notes (Signed)
[name] [name]  Telephone:([phone]) [phone]    HOSPITAL PROGRESS  NOTE   HPI: [name] [name] is a 81 yo male with a history of GIST on Gleevec, admitted on 5./18 with increased right upper and left upper quadrant abdominal pain and nausea, without vomiting. Denies fevers, chills, night sweats, vision changes, or mucositis. Denies any respiratory complaints. Denies any chest pain or palpitations. Denies lower extremity swelling. Denies nausea, heartburn. He has noted some constipation. Last bowel movement on 5/16  Appetite is normal. Denies any dysuria. Denies abnormal skin rashes, or neuropathy. Denies any bleeding issues such as epistaxis, hematemesis, hematuria or hematochezia. Ambulating without difficulty. CT of the abdomen and pelvis on 5/18 was unclear wether tumor burden worse or better (large peritoneal nodule is smaller, but has increased edema which may be tumor burden or may be due to infection). He was treated conservatively with IVF, antiemetics, analgesics and antipyretics. Blood cultures were drawn, Zosyn was started, and he was placed on clear liquid diet. We were kindly informed of the patient's admission  Brief Oncological History:  1.Gastrointestinal stromal tumor of the small bowel, stage II (T3, N0, M0), status post primary resection on 06/04/2013 , he initially declined adjuvant Gleevec  1. CT abdomen/pelvis 10/16/2014 consistent with extensive carcinomatosis 2. CT-guided biopsy of an omental mass 10/21/2014 confirmed a gastrointestinal stromal tumor 3. Initiation of Gleevec at a dose of 300 mg daily on 10/31/2014 4. Gleevec increased to 400 mg daily on 11/14/2014 5. CT abdomen/pelvis 01/13/2015 revealed improvement in the peritoneal and omental metastatic disease 2. Anemia-potentially related to GI bleeding or myelodysplasia  Bone marrow biopsy 10/21/2014 revealed a hypercellular marrow with abundant iron stores, 46X-Y, 46XY karyotype  Improved 3. NSTEMI-March  2014, cardiac catheterization 03/05/2013 confirmed an LAD occlusion  4. Presyncope event 02/27/2013 5. Admission 09/12/2014 with respiratory failure secondary to pulmonary edema 6. Edema-likely secondary to abdominal tumor and hypoalbuminemia. Resolved. 7. Elevated liver enzymes 02/13/2015. Improved 02/18/2015. Liver enzymes in normal range 03/11/2015. 8. Elevated creatinine (1.4) 02/13/2015. Benzapril placed on hold. Creatinine improved at 1.2 02/18/2015. Creatinine stable at 1.2 04/09/2015 9. Hypertension  MEDICATIONS: Scheduled Meds: . amLODipine  5 mg Oral Daily  . metoprolol tartrate  25 mg Oral BID  . piperacillin-tazobactam (ZOSYN)  IV  3.375 g Intravenous 3 times per day  . simvastatin  20 mg Oral QPM  . sodium chloride  3 mL Intravenous Q12H   Continuous Infusions: . 0.9 % NaCl with KCl 40 mEq / L 75 mL/hr (05/07/15 0616)   PRN Meds:.acetaminophen, alum & mag hydroxide-simeth, HYDROmorphone (DILAUDID) injection, ondansetron (ZOFRAN) IV   ALLERGIES:  No Known Allergies   PHYSICAL EXAMINATION:  Filed Vitals:   05/07/15 0438  BP: 139/59  Pulse: 92  Temp: 98.9 F (37.2 C)  Resp: 19   Filed Weights   05/06/15 0419 05/07/15 0737  Weight: 161 lb 13.1 oz (73.4 kg) 164 lb 12.8 oz (74.753 kg)    GENERAL:alert, no distress and comfortable SKIN: skin color, texture, turgor are normal, no rashes or significant lesions EYES: normal, conjunctiva are pink and non-injected, sclera clear OROPHARYNX:no exudate, no erythema and lips, buccal mucosa, and tongue normal  NECK: supple, thyroid normal size, non-tender, without nodularity LYMPH:  no palpable lymphadenopathy in the cervical, axillary or inguinal LUNGS: clear to auscultation and percussion with normal breathing effort HEART: regular rate & rhythm and no murmurs and no lower extremity edema ABDOMEN:abdomen distended, tender to palpation diffusely,  and normal bowel sounds Musculoskeletal:no cyanosis of digits  and no  clubbing  PSYCH: alert & oriented x 3 with fluent speech NEURO: no focal motor/sensory deficits   LABORATORY/RADIOLOGY DATA:   Recent Labs Lab 05/05/15 2137 05/07/15 0539  WBC 7.3 10.4  HGB 12.0* 9.5*  HCT 35.6* 28.5*  PLT 236 158  MCV 104.1* 105.2*  MCH 35.1* 35.1*  MCHC 33.7 33.3  RDW 12.5 12.9  LYMPHSABS 1.2  --   MONOABS 0.4  --   EOSABS 0.1  --   BASOSABS 0.0  --     CMP    Recent Labs Lab 05/05/15 2137 05/07/15 0539  NA 139 139  K 3.1* 4.2  CL 100* 106  CO2 27 25  GLUCOSE 188* 144*  BUN 21* 32*  CREATININE 1.34* 1.23  CALCIUM 8.9 7.7*  AST 26  --   ALT 27  --   ALKPHOS 84  --   BILITOT 1.0  --         Component Value Date/Time   BILITOT 1.0 05/05/2015 2137   BILITOT 0.55 04/09/2015 0900    A Recent Labs Lab 05/06/15 0304  INR 1.14      Urinalysis    Component Value Date/Time   COLORURINE YELLOW 05/06/2015 0140   APPEARANCEUR CLEAR 05/06/2015 0140   LABSPEC 1.018 05/06/2015 0140   PHURINE 6.0 05/06/2015 0140   GLUCOSEU NEGATIVE 05/06/2015 0140   HGBUR NEGATIVE 05/06/2015 0140   BILIRUBINUR SMALL* 05/06/2015 0140   KETONESUR NEGATIVE 05/06/2015 0140   PROTEINUR NEGATIVE 05/06/2015 0140   UROBILINOGEN 0.2 05/06/2015 0140   NITRITE NEGATIVE 05/06/2015 0140   LEUKOCYTESUR NEGATIVE 05/06/2015 0140    Drugs of Abuse  No results found for: LABOPIA, COCAINSCRNUR, LABBENZ, AMPHETMU, THCU, LABBARB   Liver Function Tests:  Recent Labs Lab 05/05/15 2137  AST 26  ALT 27  ALKPHOS 84  BILITOT 1.0  PROT 7.1  ALBUMIN 4.1    Recent Labs Lab 05/05/15 2137  LIPASE 21*   No results for input(s): AMMONIA in the last 168 hours.   Radiology Studies:  Ct Abdomen Pelvis Wo Contrast  05/06/2015   CLINICAL DATA:  Mid right-sided abdominal pain, onset tonight. Nausea without vomiting. History of GI stromal tumor of small bowel.  EXAM: CT ABDOMEN AND PELVIS WITHOUT CONTRAST  TECHNIQUE: Multidetector CT imaging of the abdomen and pelvis  was performed following the standard protocol without IV contrast.  COMPARISON:  CT 01/13/2015  FINDINGS: Resolution of previous left pleural effusion. Depending ground-glass opacity in the right lower lobe consistent with atelectasis. The heart is enlarged.  Lack of IV contrast limits assessment for focal hepatic lesion, allowing for this, no focal lesion is seen. Gallstones are seen within the gallbladder which is physiologically distended. Small amount perihepatic ascites, the degree of which is slightly increased from prior exam. Spleen is normal in size, development of small volume of perisplenic ascites.  Mesenteric and omental edema and ill-defined soft tissue omental densities consistent with known peritoneal disease. Soft tissue nodule in the midline upper abdomen measures 2.8 x 2.2 cm, previously 3.5 x 2.3 cm.  Small and large bowel are suboptimally assessed given lack contrast and haziness throughout the abdomen. The stomach through is distended with fluid, small amount of fluid in the distal esophagus is small hiatal hernia. Enteric anastomosis in the mid to left abdomen. Bowel loops in the right lower quadrant of the abdomen are suboptimally defined. There is hyperattenuation within a tubular structure in the right lower quadrant that may reflect the appendix, however neoplasm is  not entirely excluded. There is increased soft tissue density/fluid and edema about this structure which may reflect increased ascites. Edema and fluid tracks in the right inguinal canal with possible peritoneal implant in the inguinal canal. There are multiple colonic diverticula containing retained barium from prior exams.  No adrenal nodule. Pancreas is normal. There is no hydronephrosis, kidneys are symmetric in size. Questionable soft tissue density in the region of the porta, may reflect periportal lymph nodes.  Dense atherosclerosis of the abdominal aorta without aneurysm. Small retroperitoneal soft tissue densities.  In  the pelvis the bladder is physiologically distended. Prostate gland is normal in size. Small amount pelvic ascites.  No lytic or blastic osseous lesions. Degenerative change in the spine.  IMPRESSION: 1. Increased intra-abdominal ascites in the right and left upper quadrant and right lower quadrant, the degree of which remains mild. There is increased peritoneal and omental edema of the right lower quadrant. This edema and soft tissue density tracks into the right inguinal canal which may reflect progression of known metastatic disease, versus decompressed small bowel tracking in the right inguinal hernia. No proximal small bowel dilatation to suggest obstruction. 2. There is however interval improvement in size of dominant omental nodule in the anterior upper abdomen from prior. 3. Tubular structure in the right lower quadrant containing high-density. This high-density was seen on prior exam, it is unclear whether this represents the appendix with retained barium versus related to neoplasm or metastatic disease. There is overall increase in the fluid and soft tissue in the right lower quadrant, in addition adjacent to these tubular structure, however the appearance does not favor that of appendicitis. 4. Cholelithiasis without findings of acute cholecystitis. Diverticulosis without diverticulitis.   Electronically Signed   By: Jeb Levering M.D.   On: 05/06/2015 00:17   US Abdomen Limited  05/06/2015   CLINICAL DATA:  Abdominal pain.  Evaluate for pocket of ascites.  EXAM: LIMITED ABDOMEN ULTRASOUND FOR ASCITES  TECHNIQUE: Limited ultrasound survey for ascites was performed in all four abdominal quadrants.  COMPARISON:  CT abdomen 05/05/2015.  FINDINGS: A drainable pocket of ascites was not identified in any of the 4 quadrants.  IMPRESSION: As above.   Electronically Signed   By: Rolla Flatten M.D.   On: 05/06/2015 12:45       ASSESSMENT AND PLAN:  Gastrointestinal stromal tumor of the small bowel,  stage II (T3, N0, M0), status post primary resection on 06/04/2013  Currently on Gleevec.  Abdominal Pain Ascites CT abdomen and pelvis CT of the abdomen and pelvis on 5/18 was unclear wether tumor burden worse or better (large peritoneal nodule is smaller, but has increased edema which may be tumor burden or may be due to infection). Ascites were noted. 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  No bleeding issues noted No transfusion is indicated at this time Monitor counts closely Transfuse blood to maintain a Hb of 7.5  g or if the patient is acutely bleeding  DVT prophylaxis  On SCDs  Constipation In the setting of opioids, ?tumor CT abdomen and pelvis CT of the abdomen and pelvis on 5/18 was unclear wether tumor burden worse or better (large peritoneal nodule is smaller, but has increased edema which may be tumor burden or may be due to infection). Last bowel movement on 5/16 Initiate laxative therapy for relief.  Full Code   Other medical issues as per  admitting team     Rondel Jumbo, PA-C 05/07/2015, 8:29 AM   [name] [name] was interviewed and examined. He reports the acute onset of right abdomen pain 05/04/2015. The pain is improved today. He remains on Gleevec for treatment of the recurrent gastrointestinal stromal tumor.  I reviewed the 05/06/2015 CT in radiology. The carcinomatosis appears unchanged. There is thickening at the appendix with adjacent soft tissue fullness concerning for appendicitis.  Recommendations: 1. Surgical consult to consider a diagnosis of acute appendicitis 2. Resume Gleevec

## 2015-05-07 NOTE — Consult Note (Signed)
[name] [name] 1928-12-31  [id number].   Primary Care MD: Briscoe Deutscher Requesting MD: Dr. Margreta Journey Rama Chief Complaint/Reason for Consult: possible appendicitis HPI: this is an 81 yo white male who was diagnosed with a GIST tumor in 2014 and underwent resection by Dr. [name] in November of 2014.  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  He is followed by Dr. [name] and is taking oral chemo, Gleevac.    Tuesday morning around 9 am, the patient began developing some diffuse abdominal pain and has since localized to the RLQ.  He has had some nausea, but no emesis.  He has not moved his bowels since Monday.  He has had anorexia.  He has run some low grade fevers in the 100s.  He normally does not have any abdominal pain with his carcinomatosis and this is new for him.  He was brought to [facility] where he was admitted.  His CT scan shows some thickening of his appendix, but it's difficult to tell if this is related to his carcinomatosis or appendicitis.  He was admitted and initially not placed on abx therapy.  However, due to persistent pain, zosyn was added.  He currently is feeling a little better with the addition of the zosyn.  We have been asked to evaluate him for further recommendations.  ROS : Please see HPI, otherwise negative  Family History  Problem Relation Age of Onset  . Skin cancer Brother     Past Medical History  Diagnosis Date  . Hyperlipidemia     TAKES zOCOR DAILY  . Abdominal mass 02/2013  . UTI (urinary tract infection)   . Hypertension     TAKES LOTREL,HCTZ,AND METOPROLOL DAILY  . NSTEMI (non-ST elevated myocardial infarction) 02/2013    "LIGHT: ONE MD SAID HE DID AND ONE SAID HE DIDN'T  . Pneumonia     MAR 2014  . Stromal tumor of digestive system   . CAD (coronary artery disease)   . Shortness of breath   . Acute respiratory failure with hypoxia 09/12/2014  . Hemorrhagic shock 09/13/2014  . Intraperitoneal  hemorrhage 09/17/2014  . CKD (chronic kidney disease), stage III     Past Surgical History  Procedure Laterality Date  . Cyst excision  1962    wrist  . Cardiac catheterization  03-05-13  . Laparotomy N/A 06/04/2013    Procedure: EXPLORATORY LAPAROTOMY, OPEN RESECTION OF MESENTERIC AND INTESTINAL MASS;  Surgeon: [name], MD;  Location: [facility];  Service: General;  Laterality: N/A;  Small Bowel Resection  . Colonoscopy  09/08/2014    dr scooler  . Left heart catheterization with coronary angiogram N/A 03/05/2013    Procedure: LEFT HEART CATHETERIZATION WITH CORONARY ANGIOGRAM;  Surgeon: [name], MD;  Location[facility] C[facility] LAB;  Service: Cardiovascular;  Laterality: N/A;    Social History:  reports that he has never smoked. He has never used smokeless tobacco. He reports that he does not drink alcohol or use illicit drugs.  Allergies: No Known Allergies  Medications Prior to Admission  Medication Sig Dispense Refill  . amLODipine (NORVASC) 5 MG tablet Take 5 mg by mouth daily.    . hydrochlorothiazide (HYDRODIURIL) 25 MG tablet Take 25 mg by mouth daily.    [name] imatinib (GLEEVEC) 400 MG tablet Take 1 tablet (400 mg total) by mouth daily. Take with meals and large glass of water.Caution:Chemotherapy. 30 tablet 5  . metoprolol tartrate (LOPRESSOR) 25  MG tablet Take 1 tablet (25 mg total) by mouth 2 (two) times daily. 60 tablet 2  . simvastatin (ZOCOR) 20 MG tablet Take 20 mg by mouth every evening.      Blood pressure 137/59, pulse 92, temperature 98.4 F (36.9 C), temperature source Oral, resp. rate 20, height 5' 7"  (1.702 m), weight 74.753 kg (164 lb 12.8 oz), SpO2 95 %. Physical Exam: General: pleasant, elderly appearing white male who is laying in bed in NAD HEENT: head is normocephalic, atraumatic.  Sclera are noninjected.  PERRL.  Ears and nose without any masses or lesions.  Mouth is pink and moist Heart: regular, rate, and rhythm.  Normal s1,s2. +Murmur No obvious gallops,  or rubs noted.  Palpable radial and pedal pulses bilaterally Lungs: CTAB, no wheezes, rhonchi, or rales noted.  Respiratory effort nonlabored Abd: soft, distended, tender in RUQ and most focally tender in RLQ over McBurney's point, +BS, no masses or organomegaly palpable.  Bilateral inguinal hernias noted MS: all 4 extremities are symmetrical with no cyanosis, clubbing, or edema. Skin: warm and dry with no masses, lesions, or rashes Psych: A&Ox3 with an appropriate affect.    Results for orders placed or performed during the hospital encounter of 05/05/15 (from the past 48 hour(s))  CBC with Differential     Status: Abnormal   Collection Time: 05/05/15  9:37 PM  Result Value Ref Range   WBC 7.3 4.0 - 10.5 K/uL   RBC 3.42 (L) 4.22 - 5.81 MIL/uL   Hemoglobin 12.0 (L) 13.0 - 17.0 g/dL   HCT 35.6 (L) 39.0 - 52.0 %   MCV 104.1 (H) 78.0 - 100.0 fL   MCH 35.1 (H) 26.0 - 34.0 pg   MCHC 33.7 30.0 - 36.0 g/dL   RDW 12.5 11.5 - 15.5 %   Platelets 236 150 - 400 K/uL   Neutrophils Relative % 75 43 - 77 %   Lymphocytes Relative 17 12 - 46 %   Monocytes Relative 6 3 - 12 %   Eosinophils Relative 2 0 - 5 %   Basophils Relative 0 0 - 1 %   Neutro Abs 5.6 1.7 - 7.7 K/uL   Lymphs Abs 1.2 0.7 - 4.0 K/uL   Monocytes Absolute 0.4 0.1 - 1.0 K/uL   Eosinophils Absolute 0.1 0.0 - 0.7 K/uL   Basophils Absolute 0.0 0.0 - 0.1 K/uL   Smear Review MORPHOLOGY UNREMARKABLE   Comprehensive metabolic panel     Status: Abnormal   Collection Time: 05/05/15  9:37 PM  Result Value Ref Range   Sodium 139 135 - 145 mmol/L   Potassium 3.1 (L) 3.5 - 5.1 mmol/L   Chloride 100 (L) 101 - 111 mmol/L   CO2 27 22 - 32 mmol/L   Glucose, Bld 188 (H) 65 - 99 mg/dL   BUN 21 (H) 6 - 20 mg/dL   Creatinine, Ser 1.34 (H) 0.61 - 1.24 mg/dL   Calcium 8.9 8.9 - 10.3 mg/dL   Total Protein 7.1 6.5 - 8.1 g/dL   Albumin 4.1 3.5 - 5.0 g/dL   AST 26 15 - 41 U/L   ALT 27 17 - 63 U/L   Alkaline Phosphatase 84 38 - 126 U/L   Total  Bilirubin 1.0 0.3 - 1.2 mg/dL   GFR calc non Af Amer 47 (L) >60 mL/min   GFR calc Af Amer 54 (L) >60 mL/min    Comment: (NOTE) The eGFR has been calculated using the CKD EPI equation. This calculation has  not been validated in all clinical situations. eGFR's persistently <60 mL/min signify possible Chronic Kidney Disease.    Anion gap 12 5 - 15  Lipase, blood     Status: Abnormal   Collection Time: 05/05/15  9:37 PM  Result Value Ref Range   Lipase 21 (L) 22 - 51 U/L  I-Stat Troponin, ED (not at [facility])     Status: None   Collection Time: 05/05/15 10:17 PM  Result Value Ref Range   Troponin i, poc 0.01 0.00 - 0.08 ng/mL   Comment 3            Comment: Due to the release kinetics of cTnI, a negative result within the first hours of the onset of symptoms does not rule out myocardial infarction with certainty. If myocardial infarction is still suspected, repeat the test at appropriate intervals.   I-Stat CG4 Lactic Acid, ED     Status: None   Collection Time: 05/06/15 12:33 AM  Result Value Ref Range   Lactic Acid, Venous 1.74 0.5 - 2.0 mmol/L  Urine culture     Status: None   Collection Time: 05/06/15  1:40 AM  Result Value Ref Range   Specimen Description URINE, CATHETERIZED    Special Requests NONE    Colony Count NO GROWTH Performed at [facility]     Culture NO GROWTH Performed at [facility]     Report Status 05/07/2015 FINAL   Urinalysis, Routine w reflex microscopic     Status: Abnormal   Collection Time: 05/06/15  1:40 AM  Result Value Ref Range   Color, Urine YELLOW YELLOW   APPearance CLEAR CLEAR   Specific Gravity, Urine 1.018 1.005 - 1.030   pH 6.0 5.0 - 8.0   Glucose, UA NEGATIVE NEGATIVE mg/dL   Hgb urine dipstick NEGATIVE NEGATIVE   Bilirubin Urine SMALL (A) NEGATIVE   Ketones, ur NEGATIVE NEGATIVE mg/dL   Protein, ur NEGATIVE NEGATIVE mg/dL   Urobilinogen, UA 0.2 0.0 - 1.0 mg/dL   Nitrite NEGATIVE NEGATIVE   Leukocytes, UA NEGATIVE  NEGATIVE    Comment: MICROSCOPIC NOT DONE ON URINES WITH NEGATIVE PROTEIN, BLOOD, LEUKOCYTES, NITRITE, OR GLUCOSE <1000 mg/dL.  Culture, blood (routine x 2)     Status: None (Preliminary result)   Collection Time: 05/06/15  3:04 AM  Result Value Ref Range   Specimen Description BLOOD RIGHT HAND    Special Requests Immunocompromised    Culture             BLOOD CULTURE RECEIVED NO GROWTH TO DATE CULTURE WILL BE HELD FOR 5 DAYS BEFORE ISSUING A FINAL NEGATIVE REPORT Performed at [facility]    Report Status PENDING   Culture, blood (routine x 2)     Status: None (Preliminary result)   Collection Time: 05/06/15  3:04 AM  Result Value Ref Range   Specimen Description LEFT ANTECUBITAL    Special Requests Immunocompromised    Culture             BLOOD CULTURE RECEIVED NO GROWTH TO DATE CULTURE WILL BE HELD FOR 5 DAYS BEFORE ISSUING A FINAL NEGATIVE REPORT Performed at [facility]    Report Status PENDING   Protime-INR     Status: None   Collection Time: 05/06/15  3:04 AM  Result Value Ref Range   Prothrombin Time 14.7 11.6 - 15.2 seconds   INR 1.14 0.00 - 8.14  Basic metabolic panel     Status: Abnormal   Collection  Time: 05/07/15  5:39 AM  Result Value Ref Range   Sodium 139 135 - 145 mmol/L   Potassium 4.2 3.5 - 5.1 mmol/L    Comment: DELTA CHECK NOTED REPEATED TO VERIFY NO VISIBLE HEMOLYSIS    Chloride 106 101 - 111 mmol/L   CO2 25 22 - 32 mmol/L   Glucose, Bld 144 (H) 65 - 99 mg/dL   BUN 32 (H) 6 - 20 mg/dL   Creatinine, Ser 1.23 0.61 - 1.24 mg/dL   Calcium 7.7 (L) 8.9 - 10.3 mg/dL   GFR calc non Af Amer 52 (L) >60 mL/min   GFR calc Af Amer >60 >60 mL/min    Comment: (NOTE) The eGFR has been calculated using the CKD EPI equation. This calculation has not been validated in all clinical situations. eGFR's persistently <60 mL/min signify possible Chronic Kidney Disease.    Anion gap 8 5 - 15  CBC     Status: Abnormal   Collection Time: 05/07/15  5:39  AM  Result Value Ref Range   WBC 10.4 4.0 - 10.5 K/uL   RBC 2.71 (L) 4.22 - 5.81 MIL/uL   Hemoglobin 9.5 (L) 13.0 - 17.0 g/dL    Comment: REPEATED TO VERIFY DELTA CHECK NOTED    HCT 28.5 (L) 39.0 - 52.0 %   MCV 105.2 (H) 78.0 - 100.0 fL   MCH 35.1 (H) 26.0 - 34.0 pg   MCHC 33.3 30.0 - 36.0 g/dL   RDW 12.9 11.5 - 15.5 %   Platelets 158 150 - 400 K/uL   Ct Abdomen Pelvis Wo Contrast  05/06/2015   CLINICAL DATA:  Mid right-sided abdominal pain, onset tonight. Nausea without vomiting. History of GI stromal tumor of small bowel.  EXAM: CT ABDOMEN AND PELVIS WITHOUT CONTRAST  TECHNIQUE: Multidetector CT imaging of the abdomen and pelvis was performed following the standard protocol without IV contrast.  COMPARISON:  CT 01/13/2015  FINDINGS: Resolution of previous left pleural effusion. Depending ground-glass opacity in the right lower lobe consistent with atelectasis. The heart is enlarged.  Lack of IV contrast limits assessment for focal hepatic lesion, allowing for this, no focal lesion is seen. Gallstones are seen within the gallbladder which is physiologically distended. Small amount perihepatic ascites, the degree of which is slightly increased from prior exam. Spleen is normal in size, development of small volume of perisplenic ascites.  Mesenteric and omental edema and ill-defined soft tissue omental densities consistent with known peritoneal disease. Soft tissue nodule in the midline upper abdomen measures 2.8 x 2.2 cm, previously 3.5 x 2.3 cm.  Small and large bowel are suboptimally assessed given lack contrast and haziness throughout the abdomen. The stomach through is distended with fluid, small amount of fluid in the distal esophagus is small hiatal hernia. Enteric anastomosis in the mid to left abdomen. Bowel loops in the right lower quadrant of the abdomen are suboptimally defined. There is hyperattenuation within a tubular structure in the right lower quadrant that may reflect the appendix,  however neoplasm is not entirely excluded. There is increased soft tissue density/fluid and edema about this structure which may reflect increased ascites. Edema and fluid tracks in the right inguinal canal with possible peritoneal implant in the inguinal canal. There are multiple colonic diverticula containing retained barium from prior exams.  No adrenal nodule. Pancreas is normal. There is no hydronephrosis, kidneys are symmetric in size. Questionable soft tissue density in the region of the porta, may reflect periportal lymph nodes.  Dense atherosclerosis of  the abdominal aorta without aneurysm. Small retroperitoneal soft tissue densities.  In the pelvis the bladder is physiologically distended. Prostate gland is normal in size. Small amount pelvic ascites.  No lytic or blastic osseous lesions. Degenerative change in the spine.  IMPRESSION: 1. Increased intra-abdominal ascites in the right and left upper quadrant and right lower quadrant, the degree of which remains mild. There is increased peritoneal and omental edema of the right lower quadrant. This edema and soft tissue density tracks into the right inguinal canal which may reflect progression of known metastatic disease, versus decompressed small bowel tracking in the right inguinal hernia. No proximal small bowel dilatation to suggest obstruction. 2. There is however interval improvement in size of dominant omental nodule in the anterior upper abdomen from prior. 3. Tubular structure in the right lower quadrant containing high-density. This high-density was seen on prior exam, it is unclear whether this represents the appendix with retained barium versus related to neoplasm or metastatic disease. There is overall increase in the fluid and soft tissue in the right lower quadrant, in addition adjacent to these tubular structure, however the appearance does not favor that of appendicitis. 4. Cholelithiasis without findings of acute cholecystitis.  Diverticulosis without diverticulitis.   Electronically Signed   By: Jeb Levering M.D.   On: 05/06/2015 00:17   US Abdomen Limited  05/06/2015   CLINICAL DATA:  Abdominal pain.  Evaluate for pocket of ascites.  EXAM: LIMITED ABDOMEN ULTRASOUND FOR ASCITES  TECHNIQUE: Limited ultrasound survey for ascites was performed in all four abdominal quadrants.  COMPARISON:  CT abdomen 05/05/2015.  FINDINGS: A drainable pocket of ascites was not identified in any of the 4 quadrants.  IMPRESSION: As above.   Electronically Signed   By: Rolla Flatten M.D.   On: 05/06/2015 12:45       Assessment/Plan 1. Abdominal pain, appendicitis vs pain from carcinomatosis -The patient's history and symptoms seem consistent with appendicitis; however, it is possible this is all related to his carcinomatosis as well.  Either way, he will be treated conservatively at this point with bowel rest and abx therapy.  Agree with zosyn.  I have discussed this with the patient and wife that there is no way right now to be 100% sure of the diagnosis.  I have also discussed that if this is appendicitis, many people these days do not get an operation and are treated well and completely with abx therapy.  I also discussed that if it were appendicitis and we proceeded with an operation, there is no guarantee, with his disease in his abdomen, that we could even do an operation.  They both understand and are agreeable with this current plan.  Cont NPO for now.  Thank you for this consultation.  We will follow along with you.  Roschelle Calandra E 05/07/2015, 4:22 PM Pager: [phone]

## 2015-05-07 NOTE — Progress Notes (Addendum)
Progress Note   [name] [name] DDU:[id number] DOB: 10-07-1929 DOA: 05/05/2015 PCP: Abigail Miyamoto, MD   Brief Narrative:   [name] [name] is an 81 y.o. male with h/o GIST tumor, peritoneal carcinomatosis, being treated with [facility] who was admitted 05/05/15 with a chief complaint of abdominal pain associated with nausea.  Assessment/Plan:   Principal Problem:   SIRS (systemic inflammatory response syndrome) in the setting of abdominal carcinomatosis with associated abdominal pain - Status post vigorous hydration in the ED. - Follow-up blood cultures. - IR consulted for paracentesis to rule out SBP.  Only trace ascites, so could not be done. - Continue bowel rest/clear liquid diet. Advance as tolerated. - Continue pain control efforts with Dilaudid. - On empiric zosyn to cover intra-abdominal infection, although no obvious infectious source on abdominal CT & no elevation of WBC. - Possibly from urinary retention?  Active Problems:   Urinary retention, likely secondary to pain medication adverse effect versus tumor causing obstruction of urethra - I&O cathed multiple times. - May need Foley. - Check renal ultrasound.    Chronic anemia, multifactorial with recent ABLA, anemia of chronic disease contributory - Hemoglobin drop likely dilutional.  Baseline hemoglobin 10.9.  Current hemoglobin 9.5.    Acute kidney failure, pre-renal from dehydration in the setting of stage III CKD - Renal function improved with IVF.  Baseline creatinine 1.2.  Creatinine back to usual baseline values.    GIST (gastrointestinal stromal tumor) of small bowel, malignant - Oncology consultation requested.  Dr. [name] to see. - Gleevec on hold.    Hypertension - Continue metoprolol and Norvasc. Hydrochlorothiazide on hold.    Hyperlipidemia - Continue Zocor.    DVT Prophylaxis - SCDs ordered.  Code Status: Full. Family Communication: Wife [name] [name] and daughter-in-law, [name] [name] at the  bedside. Disposition Plan: Home when stable and pain improved, likely another 2-3 days, but difficult to predict since etiology of pain not clearly identified.   IV Access:    Peripheral IV   Procedures and diagnostic studies:   Ct Abdomen Pelvis Wo Contrast  05/06/2015   CLINICAL DATA:  Mid right-sided abdominal pain, onset tonight. Nausea without vomiting. History of GI stromal tumor of small bowel.  EXAM: CT ABDOMEN AND PELVIS WITHOUT CONTRAST  TECHNIQUE: Multidetector CT imaging of the abdomen and pelvis was performed following the standard protocol without IV contrast.  COMPARISON:  CT 01/13/2015  FINDINGS: Resolution of previous left pleural effusion. Depending ground-glass opacity in the right lower lobe consistent with atelectasis. The heart is enlarged.  Lack of IV contrast limits assessment for focal hepatic lesion, allowing for this, no focal lesion is seen. Gallstones are seen within the gallbladder which is physiologically distended. Small amount perihepatic ascites, the degree of which is slightly increased from prior exam. Spleen is normal in size, development of small volume of perisplenic ascites.  Mesenteric and omental edema and ill-defined soft tissue omental densities consistent with known peritoneal disease. Soft tissue nodule in the midline upper abdomen measures 2.8 x 2.2 cm, previously 3.5 x 2.3 cm.  Small and large bowel are suboptimally assessed given lack contrast and haziness throughout the abdomen. The stomach through is distended with fluid, small amount of fluid in the distal esophagus is small hiatal hernia. Enteric anastomosis in the mid to left abdomen. Bowel loops in the right lower quadrant of the abdomen are suboptimally defined. There is hyperattenuation within a tubular structure in the right lower quadrant that may reflect the appendix, however neoplasm is not  entirely excluded. There is increased soft tissue density/fluid and edema about this structure which may  reflect increased ascites. Edema and fluid tracks in the right inguinal canal with possible peritoneal implant in the inguinal canal. There are multiple colonic diverticula containing retained barium from prior exams.  No adrenal nodule. Pancreas is normal. There is no hydronephrosis, kidneys are symmetric in size. Questionable soft tissue density in the region of the porta, may reflect periportal lymph nodes.  Dense atherosclerosis of the abdominal aorta without aneurysm. Small retroperitoneal soft tissue densities.  In the pelvis the bladder is physiologically distended. Prostate gland is normal in size. Small amount pelvic ascites.  No lytic or blastic osseous lesions. Degenerative change in the spine.  IMPRESSION: 1. Increased intra-abdominal ascites in the right and left upper quadrant and right lower quadrant, the degree of which remains mild. There is increased peritoneal and omental edema of the right lower quadrant. This edema and soft tissue density tracks into the right inguinal canal which may reflect progression of known metastatic disease, versus decompressed small bowel tracking in the right inguinal hernia. No proximal small bowel dilatation to suggest obstruction. 2. There is however interval improvement in size of dominant omental nodule in the anterior upper abdomen from prior. 3. Tubular structure in the right lower quadrant containing high-density. This high-density was seen on prior exam, it is unclear whether this represents the appendix with retained barium versus related to neoplasm or metastatic disease. There is overall increase in the fluid and soft tissue in the right lower quadrant, in addition adjacent to these tubular structure, however the appearance does not favor that of appendicitis. 4. Cholelithiasis without findings of acute cholecystitis. Diverticulosis without diverticulitis.   Electronically Signed   By: Jeb Levering M.D.   On: 05/06/2015 00:17   US Abdomen  Limited  05/06/2015   CLINICAL DATA:  Abdominal pain.  Evaluate for pocket of ascites.  EXAM: LIMITED ABDOMEN ULTRASOUND FOR ASCITES  TECHNIQUE: Limited ultrasound survey for ascites was performed in all four abdominal quadrants.  COMPARISON:  CT abdomen 05/05/2015.  FINDINGS: A drainable pocket of ascites was not identified in any of the 4 quadrants.  IMPRESSION: As above.   Electronically Signed   By: Rolla Flatten M.D.   On: 05/06/2015 12:45     Medical Consultants:    Oncology  Anti-Infectives:    Zosyn 05/05/15--->  Subjective:   [name] [name] reports some improvement in abdominal pain, now described as "sore", associated with nausea but not vomiting.  + flatus.  Moved his bowels 3 days ago.    Objective:    Filed Vitals:   05/06/15 1433 05/06/15 2025 05/07/15 0438 05/07/15 0737  BP: 147/61 143/53 139/59   Pulse: 92 95 92   Temp: 99.6 F (37.6 C) 99.2 F (37.3 C) 98.9 F (37.2 C)   TempSrc: Oral Oral Oral   Resp: 20 19 19    Height:      Weight:    74.753 kg (164 lb 12.8 oz)  SpO2: 94% 94% 94%     Intake/Output Summary (Last 24 hours) at 05/07/15 4010 Last data filed at 05/07/15 0630  Gross per 24 hour  Intake    340 ml  Output    900 ml  Net   -560 ml    Exam: Gen:  NAD, weak, sitting up in chair Cardiovascular:  RRR, No M/R/G Respiratory:  Lungs CTAB Gastrointestinal:  Abdomen firm, mildly tender, +BS Extremities:  1+ edema   Data Reviewed:  Labs: Basic Metabolic Panel:  Recent Labs Lab 05/05/15 2137 05/07/15 0539  NA 139 139  K 3.1* 4.2  CL 100* 106  CO2 27 25  GLUCOSE 188* 144*  BUN 21* 32*  CREATININE 1.34* 1.23  CALCIUM 8.9 7.7*   GFR Estimated Creatinine Clearance: 41.8 mL/min (by C-G formula based on Cr of 1.23). Liver Function Tests:  Recent Labs Lab 05/05/15 2137  AST 26  ALT 27  ALKPHOS 84  BILITOT 1.0  PROT 7.1  ALBUMIN 4.1    Recent Labs Lab 05/05/15 2137  LIPASE 21*   Coagulation profile  Recent Labs Lab  05/06/15 0304  INR 1.14    CBC:  Recent Labs Lab 05/05/15 2137 05/07/15 0539  WBC 7.3 10.4  NEUTROABS 5.6  --   HGB 12.0* 9.5*  HCT 35.6* 28.5*  MCV 104.1* 105.2*  PLT 236 158   BNP (last 3 results)  Recent Labs  09/12/14 0445  PROBNP 5726.0*   Sepsis Labs:  Recent Labs Lab 05/05/15 2137 05/06/15 0033 05/07/15 0539  WBC 7.3  --  10.4  LATICACIDVEN  --  1.74  --    Microbiology Recent Results (from the past 240 hour(s))  Urine culture     Status: None   Collection Time: 05/06/15  1:40 AM  Result Value Ref Range Status   Specimen Description URINE, CATHETERIZED  Final   Special Requests NONE  Final   Colony Count NO GROWTH Performed at [facility]   Final   Culture NO GROWTH Performed at [facility]   Final   Report Status 05/07/2015 FINAL  Final  Culture, blood (routine x 2)     Status: None (Preliminary result)   Collection Time: 05/06/15  3:04 AM  Result Value Ref Range Status   Specimen Description BLOOD RIGHT HAND  Final   Special Requests Immunocompromised  Final   Culture   Final           BLOOD CULTURE RECEIVED NO GROWTH TO DATE CULTURE WILL BE HELD FOR 5 DAYS BEFORE ISSUING A FINAL NEGATIVE REPORT Performed at [facility]    Report Status PENDING  Incomplete  Culture, blood (routine x 2)     Status: None (Preliminary result)   Collection Time: 05/06/15  3:04 AM  Result Value Ref Range Status   Specimen Description LEFT ANTECUBITAL  Final   Special Requests Immunocompromised  Final   Culture   Final           BLOOD CULTURE RECEIVED NO GROWTH TO DATE CULTURE WILL BE HELD FOR 5 DAYS BEFORE ISSUING A FINAL NEGATIVE REPORT Performed at [facility]    Report Status PENDING  Incomplete     Medications:   . amLODipine  5 mg Oral Daily  . metoprolol tartrate  25 mg Oral BID  . piperacillin-tazobactam (ZOSYN)  IV  3.375 g Intravenous 3 times per day  . simvastatin  20 mg Oral QPM  . sodium chloride  3 mL  Intravenous Q12H   Continuous Infusions: . 0.9 % NaCl with KCl 40 mEq / L 75 mL/hr (05/07/15 0616)    Time spent: 35 minutes with > 50% of time discussing current diagnostic test results, clinical impression and plan of care & coordination of care with oncologist.   LOS: 1 day   [facility] Pager [phone]. If unable to reach me by pager, please call my cell phone at [phone].  *Please refer to [email or web address], password [email or web address] to  get updated schedule on who will round on this patient, as hospitalists switch teams weekly. If 7PM-7AM, please contact night-coverage at [email or web address], password [email or web address] for any overnight needs.  05/07/2015, 9:21 AM

## 2015-05-08 DIAGNOSIS — R339 Retention of urine, unspecified: Secondary | ICD-10-CM

## 2015-05-08 LAB — CBC
HCT: 27.7 % — ABNORMAL LOW (ref 39.0–52.0)
HEMOGLOBIN: 9.4 g/dL — AB (ref 13.0–17.0)
MCH: 35.3 pg — ABNORMAL HIGH (ref 26.0–34.0)
MCHC: 33.9 g/dL (ref 30.0–36.0)
MCV: 104.1 fL — ABNORMAL HIGH (ref 78.0–100.0)
Platelets: 161 10*3/uL (ref 150–400)
RBC: 2.66 MIL/uL — AB (ref 4.22–5.81)
RDW: 13.1 % (ref 11.5–15.5)
WBC: 11 10*3/uL — ABNORMAL HIGH (ref 4.0–10.5)

## 2015-05-08 LAB — BASIC METABOLIC PANEL
Anion gap: 8 (ref 5–15)
BUN: 32 mg/dL — AB (ref 6–20)
CHLORIDE: 108 mmol/L (ref 101–111)
CO2: 24 mmol/L (ref 22–32)
Calcium: 8.1 mg/dL — ABNORMAL LOW (ref 8.9–10.3)
Creatinine, Ser: 1.17 mg/dL (ref 0.61–1.24)
GFR calc Af Amer: >60 mL/min (ref >60)
GFR calc non Af Amer: 55 mL/min — ABNORMAL LOW (ref >60)
Glucose, Bld: 144 mg/dL — ABNORMAL HIGH (ref 65–99)
POTASSIUM: 4.1 mmol/L (ref 3.5–5.1)
Sodium: 140 mmol/L (ref 135–145)

## 2015-05-08 MED ORDER — LEVALBUTEROL HCL 0.63 MG/3ML IN NEBU
0.6300 mg | INHALATION_SOLUTION | Freq: Four times a day (QID) | RESPIRATORY_TRACT | Status: DC | PRN
  Administered 2015-05-09: 0.63 mg via RESPIRATORY_TRACT
  Filled 2015-05-08: qty 3

## 2015-05-08 MED ORDER — BISACODYL 10 MG RE SUPP: 10.0000 mg | Freq: Every day | RECTAL | Status: DC | PRN

## 2015-05-08 MED ORDER — BISACODYL 10 MG RE SUPP
10.0000 mg | Freq: Once | RECTAL | Status: DC
  Filled 2015-05-08: qty 1

## 2015-05-08 MED ORDER — PANTOPRAZOLE SODIUM 40 MG IV SOLR
40.0000 mg | Freq: Once | INTRAVENOUS | Status: AC
  Administered 2015-05-08: 40 mg via INTRAVENOUS
  Filled 2015-05-08: qty 40

## 2015-05-08 NOTE — Progress Notes (Signed)
IP PROGRESS NOTE  Subjective:   The abdominal pain has improved.  Objective: Vital signs in last 24 hours: Blood pressure 154/75, pulse 108, temperature 98.1 F (36.7 C), temperature source Oral, resp. rate 20, height 5' 7"  (1.702 m), weight 165 lb 9.6 oz (75.116 kg), SpO2 93 %.  Intake/Output from previous day: 05/19 0701 - 05/20 0700 In: 3197.5 [P.O.:60; I.V.:3037.5; IV Piggyback:100] Out: 975 [Urine:975]  Physical Exam:  Abdomen: The abdomen is distended, hypoactive bowel sounds, tender in the right lower abdomen    Lab Results:  Recent Labs  05/07/15 0539 05/08/15 0404  WBC 10.4 11.0*  HGB 9.5* 9.4*  HCT 28.5* 27.7*  PLT 158 161    BMET  Recent Labs  05/07/15 0539 05/08/15 0404  NA 139 140  K 4.2 4.1  CL 106 108  CO2 25 24  GLUCOSE 144* 144*  BUN 32* 32*  CREATININE 1.23 1.17  CALCIUM 7.7* 8.1*    Studies/Results: US Renal  05/07/2015   CLINICAL DATA:  Urinary retention.  EXAM: RENAL / URINARY TRACT ULTRASOUND COMPLETE  COMPARISON:  CT scan abdomen dated 05/05/2015  FINDINGS: Right Kidney:  Length: 10.9 cm. Echogenicity within normal limits. No mass or hydronephrosis visualized.  Left Kidney:  Length: 11.5 cm. Echogenicity within normal limits. No mass or hydronephrosis visualized.  Bladder:  The bladder is empty with a Foley catheter in place.  IMPRESSION: Normal kidneys.  No hydronephrosis or other abnormality.   Electronically Signed   By: Lorriane Shire M.D.   On: 05/07/2015 17:27   US Abdomen Limited  05/06/2015   CLINICAL DATA:  Abdominal pain.  Evaluate for pocket of ascites.  EXAM: LIMITED ABDOMEN ULTRASOUND FOR ASCITES  TECHNIQUE: Limited ultrasound survey for ascites was performed in all four abdominal quadrants.  COMPARISON:  CT abdomen 05/05/2015.  FINDINGS: A drainable pocket of ascites was not identified in any of the 4 quadrants.  IMPRESSION: As above.   Electronically Signed   By: Rolla Flatten M.D.   On: 05/06/2015 12:45    Medications: I  have reviewed the patient's current medications.  Assessment/Plan:  1. 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 History of anemia, status post a nondiagnostic bone marrow biopsy 10/21/2014  3. NSTEMI March 2014  4. Admission 05/06/2015 with acute onset right abdomen pain-potentially related to acute appendicitis versus pain from carcinomatosis  Recommendations: 1.Continue Gleevec 2. Management of appendix inflammation per surgery  3. Please call Oncology as needed. I will check on him 05/11/2015.   LOS: 2 days   Tayvin Preslar  05/08/2015, 11:48 AM

## 2015-05-08 NOTE — Evaluation (Signed)
Physical Therapy Evaluation Patient Details Name: [name] [name] MRN: [id number] DOB: 01-Apr-1929 Today's Date: 05/08/2015   History of Present Illness  81 yo male admitted with SIRS. hx of NSTEMI, HTN, abdominal carcinomatosis.   Clinical Impression  On eval, pt required Min guard assist for mobility-able to ambulate ~500 feet while holding onto IV pole for support. Pt is weaker than baseline so will keep on caseload for general strengthening.     Follow Up Recommendations Home health PT (depending on progress. Pt is weaker than baseline. Is normally very active)    Equipment Recommendations  None recommended by PT    Recommendations for Other Services       Precautions / Restrictions Precautions Precautions: Fall Restrictions Weight Bearing Restrictions: No      Mobility  Bed Mobility               General bed mobility comments: oob in recliner  Transfers Overall transfer level: Needs assistance   Transfers: Sit to/from Stand Sit to Stand: Min guard         General transfer comment: close guard for safety  Ambulation/Gait Ambulation/Gait assistance: Min guard Ambulation Distance (Feet): 500 Feet Assistive device:  (IV pole) Gait Pattern/deviations: Step-through pattern;Decreased stride length     General Gait Details: slow gait speed. unsteady-required at least 1 hand support on IV pole.   Stairs            Wheelchair Mobility    Modified Rankin (Stroke Patients Only)       Balance Overall balance assessment: Needs assistance         Standing balance support: During functional activity Standing balance-Leahy Scale: Good                               Pertinent Vitals/Pain Pain Assessment: No/denies pain    Home Living Family/patient expects to be discharged to:: Private residence Living Arrangements: Spouse/significant other Available Help at Discharge: Family Type of Home: House Home Access: Stairs to enter Entrance  Stairs-Rails: None Technical brewer of Steps: 1 Home Layout: One level Home Equipment: None      Prior Function Level of Independence: Independent         Comments: very active     Hand Dominance        Extremity/Trunk Assessment   Upper Extremity Assessment: Generalized weakness           Lower Extremity Assessment: Generalized weakness      Cervical / Trunk Assessment: Normal  Communication   Communication: No difficulties  Cognition Arousal/Alertness: Awake/alert Behavior During Therapy: WFL for tasks assessed/performed Overall Cognitive Status: Within Functional Limits for tasks assessed                      General Comments      Exercises        Assessment/Plan    PT Assessment Patient needs continued PT services  PT Diagnosis Difficulty walking;Generalized weakness   PT Problem List Decreased strength;Decreased activity tolerance;Decreased balance;Decreased mobility  PT Treatment Interventions Gait training;Functional mobility training;Therapeutic activities;Therapeutic exercise;Patient/family education;Balance training   PT Goals (Current goals can be found in the Care Plan section) Acute Rehab PT Goals Patient Stated Goal: home.  PT Goal Formulation: With patient Time For Goal Achievement: 05/22/15 Potential to Achieve Goals: Good    Frequency Min 3X/week   Barriers to discharge        Co-evaluation  End of Session Equipment Utilized During Treatment: Gait belt Activity Tolerance: Patient tolerated treatment well Patient left: with call bell/phone within reach;with family/visitor present           Time: 1011-1025 PT Time Calculation (min) (ACUTE ONLY): 14 min   Charges:   PT Evaluation $Initial PT Evaluation Tier I: 1 Procedure     PT G Codes:        Weston Anna, MPT Pager: [phone]

## 2015-05-08 NOTE — Progress Notes (Signed)
[facility] Progress Note     Subjective: Pt just took [place] on an empty stomach which he usually bothers his stomach.  He usually takes it with food.  Was feeling better today up until that point.  No more N/V, does have abdominal distension.  Ambulating some OOB.  Feels weak and fatigued.  His sister in law at bedside says he's not eating much of the full liquids.     Objective: Vital signs in last 24 hours: Temp:  [98.1 F (36.7 C)-98.5 F (36.9 C)] 98.1 F (36.7 C) (05/20 0648) Pulse Rate:  [92-112] 108 (05/20 0547) Resp:  [20] 20 (05/20 0547) BP: (137-154)/(49-83) 154/75 mmHg (05/20 0547) SpO2:  [87 %-95 %] 93 % (05/20 0648) Weight:  [75.116 kg (165 lb 9.6 oz)] 75.116 kg (165 lb 9.6 oz) (05/20 0547) Last BM Date: 05/04/15  Intake/Output from previous day: 05/19 0701 - 05/20 0700 In: 3197.5 [P.O.:60; I.V.:3037.5; IV Piggyback:100] Out: 975 [Urine:975] Intake/Output this shift: Total I/O In: 150 [I.V.:150] Out: -   PE: Gen:  Alert, NAD, pleasant Abd: Soft, greatly distended, mildly tender, +BS, no HSM   Lab Results:   Recent Labs  05/07/15 0539 05/08/15 0404  WBC 10.4 11.0*  HGB 9.5* 9.4*  HCT 28.5* 27.7*  PLT 158 161   BMET  Recent Labs  05/07/15 0539 05/08/15 0404  NA 139 140  K 4.2 4.1  CL 106 108  CO2 25 24  GLUCOSE 144* 144*  BUN 32* 32*  CREATININE 1.23 1.17  CALCIUM 7.7* 8.1*   PT/INR  Recent Labs  05/06/15 0304  LABPROT 14.7  INR 1.14   CMP     Component Value Date/Time   NA 140 05/08/2015 0404   NA 145 04/09/2015 0900   K 4.1 05/08/2015 0404   K 4.1 04/09/2015 0900   CL 108 05/08/2015 0404   CO2 24 05/08/2015 0404   CO2 27 04/09/2015 0900   GLUCOSE 144* 05/08/2015 0404   GLUCOSE 116 04/09/2015 0900   BUN 32* 05/08/2015 0404   BUN 19.3 04/09/2015 0900   CREATININE 1.17 05/08/2015 0404   CREATININE 1.2 04/09/2015 0900   CREATININE 1.18 04/25/2013 1041   CALCIUM 8.1* 05/08/2015 0404   CALCIUM 8.6 04/09/2015  0900   PROT 7.1 05/05/2015 2137   PROT 6.3* 04/09/2015 0900   ALBUMIN 4.1 05/05/2015 2137   ALBUMIN 3.6 04/09/2015 0900   AST 26 05/05/2015 2137   AST 26 04/09/2015 0900   ALT 27 05/05/2015 2137   ALT 34 04/09/2015 0900   ALKPHOS 84 05/05/2015 2137   ALKPHOS 79 04/09/2015 0900   BILITOT 1.0 05/05/2015 2137   BILITOT 0.55 04/09/2015 0900   GFRNONAA 55* 05/08/2015 0404   GFRAA >60 05/08/2015 0404   Lipase     Component Value Date/Time   LIPASE 21* 05/05/2015 2137       Studies/Results: US Renal  05/07/2015   CLINICAL DATA:  Urinary retention.  EXAM: RENAL / URINARY TRACT ULTRASOUND COMPLETE  COMPARISON:  CT scan abdomen dated 05/05/2015  FINDINGS: Right Kidney:  Length: 10.9 cm. Echogenicity within normal limits. No mass or hydronephrosis visualized.  Left Kidney:  Length: 11.5 cm. Echogenicity within normal limits. No mass or hydronephrosis visualized.  Bladder:  The bladder is empty with a Foley catheter in place.  IMPRESSION: Normal kidneys.  No hydronephrosis or other abnormality.   Electronically Signed   By: Lorriane Shire M.D.   On: 05/07/2015 17:27   US Abdomen Limited  05/06/2015  CLINICAL DATA:  Abdominal pain.  Evaluate for pocket of ascites.  EXAM: LIMITED ABDOMEN ULTRASOUND FOR ASCITES  TECHNIQUE: Limited ultrasound survey for ascites was performed in all four abdominal quadrants.  COMPARISON:  CT abdomen 05/05/2015.  FINDINGS: A drainable pocket of ascites was not identified in any of the 4 quadrants.  IMPRESSION: As above.   Electronically Signed   By: Rolla Flatten M.D.   On: 05/06/2015 12:45    Anti-infectives: Anti-infectives    Start     Dose/Rate Route Frequency Ordered Stop   05/07/15 1045  ciprofloxacin (CIPRO) IVPB 400 mg  Status:  Discontinued     400 mg 200 mL/hr over 60 Minutes Intravenous Every 12 hours 05/07/15 1032 05/07/15 1032   05/07/15 1045  metroNIDAZOLE (FLAGYL) IVPB 500 mg  Status:  Discontinued     500 mg 100 mL/hr over 60 Minutes  Intravenous Every 8 hours 05/07/15 1032 05/07/15 1032   05/06/15 1400  piperacillin-tazobactam (ZOSYN) IVPB 3.375 g     3.375 g 12.5 mL/hr over 240 Minutes Intravenous 3 times per day 05/06/15 0535     05/06/15 0300  vancomycin (VANCOCIN) IVPB 1000 mg/200 mL premix     1,000 mg 200 mL/hr over 60 Minutes Intravenous  Once 05/06/15 0247 05/06/15 0419   05/06/15 0245  piperacillin-tazobactam (ZOSYN) IVPB 3.375 g     3.375 g 12.5 mL/hr over 240 Minutes Intravenous  Once 05/06/15 0247 05/06/15 0938       Assessment/Plan Abdominal pain, appendicitis vs pain from carcinomatosis -The patient's history and symptoms seem consistent with appendicitis; however, it is possible this is all related to his carcinomatosis as well.  -Conservative management with bowel rest and abx therapy (Zosyn Day #3)  -Tolerating minimal amounts of jello and pudding and sips of liquids -No urgent surgical needs -IVF, pain control but limit narcotics, antiemetics -Encouraged ambulation and IS -Add dulcolax daily prn  Leukocytosis -11.0 GIST tumors with carcinomatosis with recent intraperitoneal hemmorrhage    LOS: 2 days    DORT, Vy Badley 05/08/2015, 9:15 AM Pager: [phone]

## 2015-05-08 NOTE — Progress Notes (Signed)
CSW was following for potential social work needs.  Pt has been assessed by PT and recommendation for home health PT.  No social work needs have been identified throughout hospitalization.  CSW signing off.   Please re-consult if social work needs arise.   Alison Murray, MSW, [place] [facility] [phone]

## 2015-05-08 NOTE — Progress Notes (Signed)
Progress Note   [name] [name] JAS:[id number] DOB: 1929/02/04 DOA: 05/05/2015 PCP: Abigail Miyamoto, MD   Brief Narrative:   [name] [name] is an 81 y.o. male with h/o GIST tumor, peritoneal carcinomatosis, being treated with Julian who was admitted 05/05/15 with a chief complaint of abdominal pain associated with nausea.  Assessment/Plan:   Principal Problem:   SIRS (systemic inflammatory response syndrome) in the setting of abdominal carcinomatosis with associated abdominal pain - Status post vigorous hydration in the ED. - Follow-up blood cultures. - IR consulted for paracentesis to rule out SBP.  Only trace ascites, so could not be done. - Continue pain control efforts with Dilaudid. - On empiric zosyn to cover intra-abdominal infection, possibly appendicitis although not entirely clear based on CT findings. - Foley catheter now placed for urinary retention, which may have been contributory. - General surgery following with no current plans for surgical exploration. - Dulcolax suppository given per surgical recommendations. Continue conservative care.  Active Problems:   Urinary retention, likely secondary to pain medication adverse effect versus tumor causing obstruction of urethra - Foley catheter placed 05/07/15. - Renal ultrasound negative for hydronephrosis.    Chronic anemia, multifactorial with recent ABLA, anemia of chronic disease contributory - Hemoglobin drop likely dilutional.  Baseline hemoglobin 10.9.  Current hemoglobin 9.4.    Acute kidney failure, pre-renal from dehydration in the setting of stage III CKD - Renal function improved with IVF.  Baseline creatinine 1.2.  Creatinine back to usual baseline values.    GIST (gastrointestinal stromal tumor) of small bowel, malignant - Dr. [name] following.  [place] resumed.    Hypertension - Continue metoprolol and Norvasc. Hydrochlorothiazide on hold.    Hyperlipidemia - Continue Zocor.    DVT Prophylaxis - SCDs  ordered.  Code Status: Full. Family Communication: Wife [name] and daughter-in-law, Inez Catalina at the bedside. Disposition Plan: Home when stable and pain improved, likely another 2-3 days, but difficult to predict since etiology of pain not clearly identified.  IV Access:    Peripheral IV   Procedures and diagnostic studies:   Ct Abdomen Pelvis Wo Contrast  05/06/2015   CLINICAL DATA:  Mid right-sided abdominal pain, onset tonight. Nausea without vomiting. History of GI stromal tumor of small bowel.  EXAM: CT ABDOMEN AND PELVIS WITHOUT CONTRAST  TECHNIQUE: Multidetector CT imaging of the abdomen and pelvis was performed following the standard protocol without IV contrast.  COMPARISON:  CT 01/13/2015  FINDINGS: Resolution of previous left pleural effusion. Depending ground-glass opacity in the right lower lobe consistent with atelectasis. The heart is enlarged.  Lack of IV contrast limits assessment for focal hepatic lesion, allowing for this, no focal lesion is seen. Gallstones are seen within the gallbladder which is physiologically distended. Small amount perihepatic ascites, the degree of which is slightly increased from prior exam. Spleen is normal in size, development of small volume of perisplenic ascites.  Mesenteric and omental edema and ill-defined soft tissue omental densities consistent with known peritoneal disease. Soft tissue nodule in the midline upper abdomen measures 2.8 x 2.2 cm, previously 3.5 x 2.3 cm.  Small and large bowel are suboptimally assessed given lack contrast and haziness throughout the abdomen. The stomach through is distended with fluid, small amount of fluid in the distal esophagus is small hiatal hernia. Enteric anastomosis in the mid to left abdomen. Bowel loops in the right lower quadrant of the abdomen are suboptimally defined. There is hyperattenuation within a tubular structure in the right lower quadrant that  may reflect the appendix, however neoplasm is not  entirely excluded. There is increased soft tissue density/fluid and edema about this structure which may reflect increased ascites. Edema and fluid tracks in the right inguinal canal with possible peritoneal implant in the inguinal canal. There are multiple colonic diverticula containing retained barium from prior exams.  No adrenal nodule. Pancreas is normal. There is no hydronephrosis, kidneys are symmetric in size. Questionable soft tissue density in the region of the porta, may reflect periportal lymph nodes.  Dense atherosclerosis of the abdominal aorta without aneurysm. Small retroperitoneal soft tissue densities.  In the pelvis the bladder is physiologically distended. Prostate gland is normal in size. Small amount pelvic ascites.  No lytic or blastic osseous lesions. Degenerative change in the spine.  IMPRESSION: 1. Increased intra-abdominal ascites in the right and left upper quadrant and right lower quadrant, the degree of which remains mild. There is increased peritoneal and omental edema of the right lower quadrant. This edema and soft tissue density tracks into the right inguinal canal which may reflect progression of known metastatic disease, versus decompressed small bowel tracking in the right inguinal hernia. No proximal small bowel dilatation to suggest obstruction. 2. There is however interval improvement in size of dominant omental nodule in the anterior upper abdomen from prior. 3. Tubular structure in the right lower quadrant containing high-density. This high-density was seen on prior exam, it is unclear whether this represents the appendix with retained barium versus related to neoplasm or metastatic disease. There is overall increase in the fluid and soft tissue in the right lower quadrant, in addition adjacent to these tubular structure, however the appearance does not favor that of appendicitis. 4. Cholelithiasis without findings of acute cholecystitis. Diverticulosis without  diverticulitis.   Electronically Signed   By: Jeb Levering M.D.   On: 05/06/2015 00:17   US Renal  05/07/2015   CLINICAL DATA:  Urinary retention.  EXAM: RENAL / URINARY TRACT ULTRASOUND COMPLETE  COMPARISON:  CT scan abdomen dated 05/05/2015  FINDINGS: Right Kidney:  Length: 10.9 cm. Echogenicity within normal limits. No mass or hydronephrosis visualized.  Left Kidney:  Length: 11.5 cm. Echogenicity within normal limits. No mass or hydronephrosis visualized.  Bladder:  The bladder is empty with a Foley catheter in place.  IMPRESSION: Normal kidneys.  No hydronephrosis or other abnormality.   Electronically Signed   By: Lorriane Shire M.D.   On: 05/07/2015 17:27   US Abdomen Limited  05/06/2015   CLINICAL DATA:  Abdominal pain.  Evaluate for pocket of ascites.  EXAM: LIMITED ABDOMEN ULTRASOUND FOR ASCITES  TECHNIQUE: Limited ultrasound survey for ascites was performed in all four abdominal quadrants.  COMPARISON:  CT abdomen 05/05/2015.  FINDINGS: A drainable pocket of ascites was not identified in any of the 4 quadrants.  IMPRESSION: As above.   Electronically Signed   By: Rolla Flatten M.D.   On: 05/06/2015 12:45     Medical Consultants:    Oncology  Anti-Infectives:    Zosyn 05/05/15--->  Subjective:   [name] [name] reports some ongoing improvement in abdominal pain, continues to describe it as "sore", associated with nausea but not vomiting.  + flatus.  No BM yet, but was given a suppository and feels like he may need to move his bowels now.  Had nausea after taking his Gleevac this morning.  Objective:    Filed Vitals:   05/08/15 0010 05/08/15 0547 05/08/15 0648 05/08/15 1350  BP: 153/83 154/75  149/72  Pulse: 112  108  101  Temp: 98.1 F (36.7 C) 98.5 F (36.9 C) 98.1 F (36.7 C) 98.2 F (36.8 C)  TempSrc: Oral Oral Oral Oral  Resp: 20 20  20   Height:      Weight:  75.116 kg (165 lb 9.6 oz)    SpO2: 87% 91% 93% 96%    Intake/Output Summary (Last 24 hours) at 05/08/15  1706 Last data filed at 05/08/15 1500  Gross per 24 hour  Intake 3647.5 ml  Output   1375 ml  Net 2272.5 ml    Exam: Gen:  NAD Cardiovascular:  RRR, No M/R/G Respiratory:  Lungs CTAB Gastrointestinal:  Abdomen firm, mildly tender, +BS Extremities:  1+ edema   Data Reviewed:    Labs: Basic Metabolic Panel:  Recent Labs Lab 05/05/15 2137 05/07/15 0539 05/08/15 0404  NA 139 139 140  K 3.1* 4.2 4.1  CL 100* 106 108  CO2 27 25 24   GLUCOSE 188* 144* 144*  BUN 21* 32* 32*  CREATININE 1.34* 1.23 1.17  CALCIUM 8.9 7.7* 8.1*   GFR Estimated Creatinine Clearance: 43.9 mL/min (by C-G formula based on Cr of 1.17). Liver Function Tests:  Recent Labs Lab 05/05/15 2137  AST 26  ALT 27  ALKPHOS 84  BILITOT 1.0  PROT 7.1  ALBUMIN 4.1    Recent Labs Lab 05/05/15 2137  LIPASE 21*   Coagulation profile  Recent Labs Lab 05/06/15 0304  INR 1.14    CBC:  Recent Labs Lab 05/05/15 2137 05/07/15 0539 05/08/15 0404  WBC 7.3 10.4 11.0*  NEUTROABS 5.6  --   --   HGB 12.0* 9.5* 9.4*  HCT 35.6* 28.5* 27.7*  MCV 104.1* 105.2* 104.1*  PLT 236 158 161   BNP (last 3 results)  Recent Labs  09/12/14 0445  PROBNP 5726.0*   Sepsis Labs:  Recent Labs Lab 05/05/15 2137 05/06/15 0033 05/07/15 0539 05/08/15 0404  WBC 7.3  --  10.4 11.0*  LATICACIDVEN  --  1.74  --   --    Microbiology Recent Results (from the past 240 hour(s))  Urine culture     Status: None   Collection Time: 05/06/15  1:40 AM  Result Value Ref Range Status   Specimen Description URINE, CATHETERIZED  Final   Special Requests NONE  Final   Colony Count NO GROWTH Performed at [facility]   Final   Culture NO GROWTH Performed at [facility]   Final   Report Status 05/07/2015 FINAL  Final  Culture, blood (routine x 2)     Status: None (Preliminary result)   Collection Time: 05/06/15  3:04 AM  Result Value Ref Range Status   Specimen Description BLOOD RIGHT HAND   Final   Special Requests Immunocompromised  Final   Culture   Final           BLOOD CULTURE RECEIVED NO GROWTH TO DATE CULTURE WILL BE HELD FOR 5 DAYS BEFORE ISSUING A FINAL NEGATIVE REPORT Performed at [facility]    Report Status PENDING  Incomplete  Culture, blood (routine x 2)     Status: None (Preliminary result)   Collection Time: 05/06/15  3:04 AM  Result Value Ref Range Status   Specimen Description LEFT ANTECUBITAL  Final   Special Requests Immunocompromised  Final   Culture   Final           BLOOD CULTURE RECEIVED NO GROWTH TO DATE CULTURE WILL BE HELD FOR 5 DAYS BEFORE ISSUING A  FINAL NEGATIVE REPORT Performed at [facility]    Report Status PENDING  Incomplete     Medications:   . amLODipine  5 mg Oral Daily  . bisacodyl  10 mg Rectal Once  . imatinib  400 mg Oral Q breakfast  . metoprolol tartrate  25 mg Oral BID  . piperacillin-tazobactam (ZOSYN)  IV  3.375 g Intravenous 3 times per day  . simvastatin  20 mg Oral QPM  . sodium chloride  3 mL Intravenous Q12H   Continuous Infusions: . 0.9 % NaCl with KCl 40 mEq / L 75 mL/hr (05/08/15 0859)    Time spent: 25 minutes.   LOS: 2 days   Aluna Whiston  [facility] Pager [phone]. If unable to reach me by pager, please call my cell phone at [phone].  *Please refer to [email or web address], password [email or web address] to get updated schedule on who will round on this patient, as hospitalists switch teams weekly. If 7PM-7AM, please contact night-coverage at [email or web address], password [email or web address] for any overnight needs.  05/08/2015, 5:06 PM

## 2015-05-09 MED ORDER — BISACODYL 10 MG RE SUPP
10.0000 mg | Freq: Every day | RECTAL | Status: DC
  Administered 2015-05-09: 10 mg via RECTAL

## 2015-05-09 NOTE — Progress Notes (Signed)
Patient ID: [name] [name], male   DOB: 02-Oct-1929, 81 y.o.   MRN: [id number] [name] [name] Surgery Progress Note     Subjective: Slight improvement in pain.  No n/v.  Tolerating some full liquids.     Objective: Vital signs in last 24 hours: Temp:  [97.8 F (36.6 C)-99.5 F (37.5 C)] 97.8 F (36.6 C) (05/21 0549) Pulse Rate:  [100-112] 100 (05/21 0549) Resp:  [20] 20 (05/21 0549) BP: (139-171)/(67-72) 139/67 mmHg (05/21 0549) SpO2:  [94 %-96 %] 94 % (05/21 0549) Weight:  [74.9 kg (165 lb 2 oz)] 74.9 kg (165 lb 2 oz) (05/21 0549) Last BM Date: 05/08/15  Intake/Output from previous day: 05/20 0701 - 05/21 0700 In: 2635 [P.O.:810; I.V.:1725; IV Piggyback:100] Out: 1200 [Urine:1200] Intake/Output this shift:    PE: Gen:  Alert, NAD, pleasant Abd: Soft, distended.  Sl tender in RLQ  Lab Results:   Recent Labs  05/07/15 0539 05/08/15 0404  WBC 10.4 11.0*  HGB 9.5* 9.4*  HCT 28.5* 27.7*  PLT 158 161   BMET  Recent Labs  05/07/15 0539 05/08/15 0404  NA 139 140  K 4.2 4.1  CL 106 108  CO2 25 24  GLUCOSE 144* 144*  BUN 32* 32*  CREATININE 1.23 1.17  CALCIUM 7.7* 8.1*   PT/INR No results for input(s): LABPROT, INR in the last 72 hours. CMP     Component Value Date/Time   NA 140 05/08/2015 0404   NA 145 04/09/2015 0900   K 4.1 05/08/2015 0404   K 4.1 04/09/2015 0900   CL 108 05/08/2015 0404   CO2 24 05/08/2015 0404   CO2 27 04/09/2015 0900   GLUCOSE 144* 05/08/2015 0404   GLUCOSE 116 04/09/2015 0900   BUN 32* 05/08/2015 0404   BUN 19.3 04/09/2015 0900   CREATININE 1.17 05/08/2015 0404   CREATININE 1.2 04/09/2015 0900   CREATININE 1.18 04/25/2013 1041   CALCIUM 8.1* 05/08/2015 0404   CALCIUM 8.6 04/09/2015 0900   PROT 7.1 05/05/2015 2137   PROT 6.3* 04/09/2015 0900   ALBUMIN 4.1 05/05/2015 2137   ALBUMIN 3.6 04/09/2015 0900   AST 26 05/05/2015 2137   AST 26 04/09/2015 0900   ALT 27 05/05/2015 2137   ALT 34 04/09/2015 0900   ALKPHOS 84 05/05/2015  2137   ALKPHOS 79 04/09/2015 0900   BILITOT 1.0 05/05/2015 2137   BILITOT 0.55 04/09/2015 0900   GFRNONAA 55* 05/08/2015 0404   GFRAA >60 05/08/2015 0404   Lipase     Component Value Date/Time   LIPASE 21* 05/05/2015 2137       Studies/Results: US Renal  05/07/2015   CLINICAL DATA:  Urinary retention.  EXAM: RENAL / URINARY TRACT ULTRASOUND COMPLETE  COMPARISON:  CT scan abdomen dated 05/05/2015  FINDINGS: Right Kidney:  Length: 10.9 cm. Echogenicity within normal limits. No mass or hydronephrosis visualized.  Left Kidney:  Length: 11.5 cm. Echogenicity within normal limits. No mass or hydronephrosis visualized.  Bladder:  The bladder is empty with a Foley catheter in place.  IMPRESSION: Normal kidneys.  No hydronephrosis or other abnormality.   Electronically Signed   By: Lorriane Shire M.D.   On: 05/07/2015 17:27    Anti-infectives: Anti-infectives    Start     Dose/Rate Route Frequency Ordered Stop   05/07/15 1045  ciprofloxacin (CIPRO) IVPB 400 mg  Status:  Discontinued     400 mg 200 mL/hr over 60 Minutes Intravenous Every 12 hours 05/07/15 1032 05/07/15 1032   05/07/15  1045  metroNIDAZOLE (FLAGYL) IVPB 500 mg  Status:  Discontinued     500 mg 100 mL/hr over 60 Minutes Intravenous Every 8 hours 05/07/15 1032 05/07/15 1032   05/06/15 1400  piperacillin-tazobactam (ZOSYN) IVPB 3.375 g     3.375 g 12.5 mL/hr over 240 Minutes Intravenous 3 times per day 05/06/15 0535     05/06/15 0300  vancomycin (VANCOCIN) IVPB 1000 mg/200 mL premix     1,000 mg 200 mL/hr over 60 Minutes Intravenous  Once 05/06/15 0247 05/06/15 0419   05/06/15 0245  piperacillin-tazobactam (ZOSYN) IVPB 3.375 g     3.375 g 12.5 mL/hr over 240 Minutes Intravenous  Once 05/06/15 0247 05/06/15 4268       Assessment/Plan Abdominal pain, appendicitis vs pain from carcinomatosis -The patient's history and symptoms seem consistent with appendicitis; however, it is possible this is all related to his  carcinomatosis as well.  Seems to be slightly improving.   -Conservative management with bowel rest and abx therapy (Zosyn Day #4)  -Tolerating minimal amounts of jello and pudding and sips of liquids -No urgent surgical needs -IVF, pain control but limit narcotics, antiemetics -Encouraged ambulation and IS -Add dulcolax daily prn  Leukocytosis -11.0 GIST tumors with carcinomatosis with recent intraperitoneal hemmorrhage    LOS: 3 days    [name] 05/09/2015, 10:33 AM

## 2015-05-09 NOTE — Progress Notes (Signed)
ANTIBIOTIC CONSULT NOTE - INITIAL  Pharmacy Consult for zosyn Indication: Intra-abdominal infection  No Known Allergies  Patient Measurements: Height: 5\' 7"  (170.2 cm) Weight: 165 lb 2 oz (74.9 kg) IBW/kg (Calculated) : 66.1 Adjusted Body Weight:   Vital Signs: Temp: 99.6 F (37.6 C) (05/21 1325) Temp Source: Oral (05/21 1325) BP: 154/70 mmHg (05/21 1325) Pulse Rate: 93 (05/21 1325) Intake/Output from previous day: 05/20 0701 - 05/21 0700 In: 2635 [P.O.:810; I.V.:1725; IV Piggyback:100] Out: 1200 [Urine:1200] Intake/Output from this shift:    Labs:  Recent Labs  05/07/15 0539 05/08/15 0404  WBC 10.4 11.0*  HGB 9.5* 9.4*  PLT 158 161  CREATININE 1.23 1.17   Estimated Creatinine Clearance: 43.9 mL/min (by C-G formula based on Cr of 1.17). No results for input(s): VANCOTROUGH, VANCOPEAK, VANCORANDOM, GENTTROUGH, GENTPEAK, GENTRANDOM, TOBRATROUGH, TOBRAPEAK, TOBRARND, AMIKACINPEAK, AMIKACINTROU, AMIKACIN in the last 72 hours.   Microbiology: Recent Results (from the past 720 hour(s))  Urine culture     Status: None   Collection Time: 05/06/15  1:40 AM  Result Value Ref Range Status   Specimen Description URINE, CATHETERIZED  Final   Special Requests NONE  Final   Colony Count NO GROWTH Performed at [facility]   Final   Culture NO GROWTH Performed at [facility]   Final   Report Status 05/07/2015 FINAL  Final  Culture, blood (routine x 2)     Status: None (Preliminary result)   Collection Time: 05/06/15  3:04 AM  Result Value Ref Range Status   Specimen Description BLOOD RIGHT HAND  Final   Special Requests Immunocompromised  Final   Culture   Final           BLOOD CULTURE RECEIVED NO GROWTH TO DATE CULTURE WILL BE HELD FOR 5 DAYS BEFORE ISSUING A FINAL NEGATIVE REPORT Performed at [facility]    Report Status PENDING  Incomplete  Culture, blood (routine x 2)     Status: None (Preliminary result)   Collection Time: 05/06/15  3:04  AM  Result Value Ref Range Status   Specimen Description LEFT ANTECUBITAL  Final   Special Requests Immunocompromised  Final   Culture   Final           BLOOD CULTURE RECEIVED NO GROWTH TO DATE CULTURE WILL BE HELD FOR 5 DAYS BEFORE ISSUING A FINAL NEGATIVE REPORT Performed at [facility]    Report Status PENDING  Incomplete    Medical History: Past Medical History  Diagnosis Date  . Hyperlipidemia     TAKES zOCOR DAILY  . Abdominal mass 02/2013  . UTI (urinary tract infection)   . Hypertension     TAKES LOTREL,HCTZ,AND METOPROLOL DAILY  . NSTEMI (non-ST elevated myocardial infarction) 02/2013    "LIGHT: ONE MD SAID HE DID AND ONE SAID HE DIDN'T  . Pneumonia     MAR 2014  . Stromal tumor of digestive system   . CAD (coronary artery disease)   . Shortness of breath   . Acute respiratory failure with hypoxia 09/12/2014  . Hemorrhagic shock 09/13/2014  . Intraperitoneal hemorrhage 09/17/2014  . CKD (chronic kidney disease), stage III     Medications:  Anti-infectives    Start     Dose/Rate Route Frequency Ordered Stop   05/07/15 1045  ciprofloxacin (CIPRO) IVPB 400 mg  Status:  Discontinued     400 mg 200 mL/hr over 60 Minutes Intravenous Every 12 hours 05/07/15 1032 05/07/15 1032   05/07/15 1045  metroNIDAZOLE (FLAGYL) IVPB 500 mg  Status:  Discontinued     500 mg 100 mL/hr over 60 Minutes Intravenous Every 8 hours 05/07/15 1032 05/07/15 1032   05/06/15 1400  piperacillin-tazobactam (ZOSYN) IVPB 3.375 g     3.375 g 12.5 mL/hr over 240 Minutes Intravenous 3 times per day 05/06/15 0535     05/06/15 0300  vancomycin (VANCOCIN) IVPB 1000 mg/200 mL premix     1,000 mg 200 mL/hr over 60 Minutes Intravenous  Once 05/06/15 0247 05/06/15 0419   05/06/15 0245  piperacillin-tazobactam (ZOSYN) IVPB 3.375 g     3.375 g 12.5 mL/hr over 240 Minutes Intravenous  Once 05/06/15 0247 05/06/15 1610     Assessment: Patient's an 81 y.o presented to the ED on 5/21 with abdominal pain  and SIRS.  He's currently on zosyn day #4 for suspected appendicitis.  Scr 1.17 (crcl~44), Tmax 99.6, wbc slightly elevated at 11.  5/18 >> Vanc >> 5/18 5/18 >> Zosyn >>    5/18 blood x2: NGTD 5/18 urine: NGF   Plan:  - continue Zosyn 3.375g IV Q8H infused over 4hrs.  - f/u cultures  Syndey Jaskolski P 05/09/2015,1:42 PM

## 2015-05-09 NOTE — Progress Notes (Signed)
Progress Note   [name] IRJ:[id number] DOB: [birth year] DOA: 05/05/2015 PCP: Abigail Miyamoto, MD   Brief Narrative:   [name] [name] is an 81 y.o. male with h/o GIST tumor, peritoneal carcinomatosis, being treated with [name] who was admitted 05/05/15 with a chief complaint of abdominal pain associated with nausea.  Assessment/Plan:   Principal Problem:   Sepsis vs SIRS secondary to appendicitis versus abdominal carcinomatosis with associated abdominal pain - Status post vigorous hydration in the ED. Blood cultures negative to date. - IR consulted for paracentesis to rule out SBP.  Only trace ascites, so could not be done. - On empiric zosyn to appendicitis although not entirely clear based on CT findings. - Foley catheter now placed for urinary retention, which may have been contributory. - General surgery following with no current plans for surgical exploration. - Dulcolax suppository given per surgical recommendations. Continue conservative care. - Continue pain control efforts with Dilaudid.  Abdomen very distended.  Active Problems:   Urinary retention, likely secondary to pain medication adverse effect versus tumor causing obstruction of urethra - Foley catheter placed 05/07/15. - Renal ultrasound negative for hydronephrosis.    Chronic anemia, multifactorial with recent ABLA, anemia of chronic disease contributory - Hemoglobin drop likely dilutional.  Baseline hemoglobin 10.9.  Current hemoglobin 9.4.    Acute kidney failure, pre-renal from dehydration in the setting of stage III CKD - Renal function improved with IVF.  Baseline creatinine 1.2.  Creatinine back to usual baseline values.    GIST (gastrointestinal stromal tumor) of small bowel, malignant - Dr. [name] following.  [place] resumed.    Hypertension - Continue metoprolol and Norvasc. Hydrochlorothiazide on hold.    Hyperlipidemia - Continue Zocor.    DVT Prophylaxis - SCDs ordered.  Code Status:  Full. Family Communication: Son updated at the bedside. Disposition Plan: Home when stable and pain improved and showing signs of clinical improvement, likely several more days.  IV Access:    Peripheral IV   Procedures and diagnostic studies:   Ct Abdomen Pelvis Wo Contrast  05/06/2015   CLINICAL DATA:  Mid right-sided abdominal pain, onset tonight. Nausea without vomiting. History of GI stromal tumor of small bowel.  EXAM: CT ABDOMEN AND PELVIS WITHOUT CONTRAST  TECHNIQUE: Multidetector CT imaging of the abdomen and pelvis was performed following the standard protocol without IV contrast.  COMPARISON:  CT 01/13/2015  FINDINGS: Resolution of previous left pleural effusion. Depending ground-glass opacity in the right lower lobe consistent with atelectasis. The heart is enlarged.  Lack of IV contrast limits assessment for focal hepatic lesion, allowing for this, no focal lesion is seen. Gallstones are seen within the gallbladder which is physiologically distended. Small amount perihepatic ascites, the degree of which is slightly increased from prior exam. Spleen is normal in size, development of small volume of perisplenic ascites.  Mesenteric and omental edema and ill-defined soft tissue omental densities consistent with known peritoneal disease. Soft tissue nodule in the midline upper abdomen measures 2.8 x 2.2 cm, previously 3.5 x 2.3 cm.  Small and large bowel are suboptimally assessed given lack contrast and haziness throughout the abdomen. The stomach through is distended with fluid, small amount of fluid in the distal esophagus is small hiatal hernia. Enteric anastomosis in the mid to left abdomen. Bowel loops in the right lower quadrant of the abdomen are suboptimally defined. There is hyperattenuation within a tubular structure in the right lower quadrant that may reflect the appendix, however neoplasm is not entirely  excluded. There is increased soft tissue density/fluid and edema about this  structure which may reflect increased ascites. Edema and fluid tracks in the right inguinal canal with possible peritoneal implant in the inguinal canal. There are multiple colonic diverticula containing retained barium from prior exams.  No adrenal nodule. Pancreas is normal. There is no hydronephrosis, kidneys are symmetric in size. Questionable soft tissue density in the region of the porta, may reflect periportal lymph nodes.  Dense atherosclerosis of the abdominal aorta without aneurysm. Small retroperitoneal soft tissue densities.  In the pelvis the bladder is physiologically distended. Prostate gland is normal in size. Small amount pelvic ascites.  No lytic or blastic osseous lesions. Degenerative change in the spine.  IMPRESSION: 1. Increased intra-abdominal ascites in the right and left upper quadrant and right lower quadrant, the degree of which remains mild. There is increased peritoneal and omental edema of the right lower quadrant. This edema and soft tissue density tracks into the right inguinal canal which may reflect progression of known metastatic disease, versus decompressed small bowel tracking in the right inguinal hernia. No proximal small bowel dilatation to suggest obstruction. 2. There is however interval improvement in size of dominant omental nodule in the anterior upper abdomen from prior. 3. Tubular structure in the right lower quadrant containing high-density. This high-density was seen on prior exam, it is unclear whether this represents the appendix with retained barium versus related to neoplasm or metastatic disease. There is overall increase in the fluid and soft tissue in the right lower quadrant, in addition adjacent to these tubular structure, however the appearance does not favor that of appendicitis. 4. Cholelithiasis without findings of acute cholecystitis. Diverticulosis without diverticulitis.   Electronically Signed   By: Jeb Levering M.D.   On: 05/06/2015 00:17   US  Renal  05/07/2015   CLINICAL DATA:  Urinary retention.  EXAM: RENAL / URINARY TRACT ULTRASOUND COMPLETE  COMPARISON:  CT scan abdomen dated 05/05/2015  FINDINGS: Right Kidney:  Length: 10.9 cm. Echogenicity within normal limits. No mass or hydronephrosis visualized.  Left Kidney:  Length: 11.5 cm. Echogenicity within normal limits. No mass or hydronephrosis visualized.  Bladder:  The bladder is empty with a Foley catheter in place.  IMPRESSION: Normal kidneys.  No hydronephrosis or other abnormality.   Electronically Signed   By: Lorriane Shire M.D.   On: 05/07/2015 17:27   US Abdomen Limited  05/06/2015   CLINICAL DATA:  Abdominal pain.  Evaluate for pocket of ascites.  EXAM: LIMITED ABDOMEN ULTRASOUND FOR ASCITES  TECHNIQUE: Limited ultrasound survey for ascites was performed in all four abdominal quadrants.  COMPARISON:  CT abdomen 05/05/2015.  FINDINGS: A drainable pocket of ascites was not identified in any of the 4 quadrants.  IMPRESSION: As above.   Electronically Signed   By: Rolla Flatten M.D.   On: 05/06/2015 12:45     Medical Consultants:    Oncology  Anti-Infectives:    Zosyn 05/05/15--->  Subjective:   Beau Fanny says pain has improved, but still has a lot of abdominal bloating.  Tolerating sips of FL, but appetite improved.  Had a BM yesterday.  Objective:    Filed Vitals:   05/08/15 0648 05/08/15 1350 05/08/15 2044 05/09/15 0549  BP:  149/72 171/69 139/67  Pulse:  101 112 100  Temp: 98.1 F (36.7 C) 98.2 F (36.8 C) 99.5 F (37.5 C) 97.8 F (36.6 C)  TempSrc: Oral Oral Oral Oral  Resp:  20 20 20   Height:  Weight:    74.9 kg (165 lb 2 oz)  SpO2: 93% 96% 96% 94%    Intake/Output Summary (Last 24 hours) at 05/09/15 0254 Last data filed at 05/09/15 0600  Gross per 24 hour  Intake   2560 ml  Output   1200 ml  Net   1360 ml    Exam: Gen:  NAD, lethargic, sitting up in chair Cardiovascular:  Tachy, No M/R/G Respiratory:  Lungs CTAB Gastrointestinal:   Abdomen firm, mildly tender, +BS Extremities:  1+ edema   Data Reviewed:    Labs: Basic Metabolic Panel:  Recent Labs Lab 05/05/15 2137 05/07/15 0539 05/08/15 0404  NA 139 139 140  K 3.1* 4.2 4.1  CL 100* 106 108  CO2 27 25 24   GLUCOSE 188* 144* 144*  BUN 21* 32* 32*  CREATININE 1.34* 1.23 1.17  CALCIUM 8.9 7.7* 8.1*   GFR Estimated Creatinine Clearance: 43.9 mL/min (by C-G formula based on Cr of 1.17). Liver Function Tests:  Recent Labs Lab 05/05/15 2137  AST 26  ALT 27  ALKPHOS 84  BILITOT 1.0  PROT 7.1  ALBUMIN 4.1    Recent Labs Lab 05/05/15 2137  LIPASE 21*   Coagulation profile  Recent Labs Lab 05/06/15 0304  INR 1.14    CBC:  Recent Labs Lab 05/05/15 2137 05/07/15 0539 05/08/15 0404  WBC 7.3 10.4 11.0*  NEUTROABS 5.6  --   --   HGB 12.0* 9.5* 9.4*  HCT 35.6* 28.5* 27.7*  MCV 104.1* 105.2* 104.1*  PLT 236 158 161   BNP (last 3 results)  Recent Labs  09/12/14 0445  PROBNP 5726.0*   Sepsis Labs:  Recent Labs Lab 05/05/15 2137 05/06/15 0033 05/07/15 0539 05/08/15 0404  WBC 7.3  --  10.4 11.0*  LATICACIDVEN  --  1.74  --   --    Microbiology Recent Results (from the past 240 hour(s))  Urine culture     Status: None   Collection Time: 05/06/15  1:40 AM  Result Value Ref Range Status   Specimen Description URINE, CATHETERIZED  Final   Special Requests NONE  Final   Colony Count NO GROWTH Performed at [facility]   Final   Culture NO GROWTH Performed at [facility]   Final   Report Status 05/07/2015 FINAL  Final  Culture, blood (routine x 2)     Status: None (Preliminary result)   Collection Time: 05/06/15  3:04 AM  Result Value Ref Range Status   Specimen Description BLOOD RIGHT HAND  Final   Special Requests Immunocompromised  Final   Culture   Final           BLOOD CULTURE RECEIVED NO GROWTH TO DATE CULTURE WILL BE HELD FOR 5 DAYS BEFORE ISSUING A FINAL NEGATIVE REPORT Performed at [facility]    Report Status PENDING  Incomplete  Culture, blood (routine x 2)     Status: None (Preliminary result)   Collection Time: 05/06/15  3:04 AM  Result Value Ref Range Status   Specimen Description LEFT ANTECUBITAL  Final   Special Requests Immunocompromised  Final   Culture   Final           BLOOD CULTURE RECEIVED NO GROWTH TO DATE CULTURE WILL BE HELD FOR 5 DAYS BEFORE ISSUING A FINAL NEGATIVE REPORT Performed at [facility]    Report Status PENDING  Incomplete     Medications:   . amLODipine  5 mg Oral Daily  .  bisacodyl  10 mg Rectal Once  . imatinib  400 mg Oral Q breakfast  . metoprolol tartrate  25 mg Oral BID  . piperacillin-tazobactam (ZOSYN)  IV  3.375 g Intravenous 3 times per day  . simvastatin  20 mg Oral QPM  . sodium chloride  3 mL Intravenous Q12H   Continuous Infusions: . 0.9 % NaCl with KCl 40 mEq / L 75 mL/hr (05/08/15 2341)    Time spent: 25 minutes.   LOS: 3 days   [facility] Pager [phone]. If unable to reach me by pager, please call my cell phone at [phone].  *Please refer to [email or web address], password [email or web address] to get updated schedule on who will round on this patient, as hospitalists switch teams weekly. If 7PM-7AM, please contact night-coverage at [email or web address], password [email or web address] for any overnight needs.  05/09/2015, 8:28 AM

## 2015-05-10 ENCOUNTER — Inpatient Hospital Stay (HOSPITAL_COMMUNITY): Payer: Medicare Other

## 2015-05-10 DIAGNOSIS — A047 Enterocolitis due to Clostridium difficile: Secondary | ICD-10-CM

## 2015-05-10 DIAGNOSIS — A0472 Enterocolitis due to Clostridium difficile, not specified as recurrent: Secondary | ICD-10-CM | POA: Diagnosis not present

## 2015-05-10 LAB — CBC
HCT: 27 % — ABNORMAL LOW (ref 39.0–52.0)
HEMOGLOBIN: 8.9 g/dL — AB (ref 13.0–17.0)
MCH: 34.5 pg — AB (ref 26.0–34.0)
MCHC: 33 g/dL (ref 30.0–36.0)
MCV: 104.7 fL — ABNORMAL HIGH (ref 78.0–100.0)
PLATELETS: 189 10*3/uL (ref 150–400)
RBC: 2.58 MIL/uL — ABNORMAL LOW (ref 4.22–5.81)
RDW: 13.7 % (ref 11.5–15.5)
WBC: 9.9 10*3/uL (ref 4.0–10.5)

## 2015-05-10 LAB — BASIC METABOLIC PANEL
Anion gap: 7 (ref 5–15)
BUN: 28 mg/dL — ABNORMAL HIGH (ref 6–20)
CHLORIDE: 113 mmol/L — AB (ref 101–111)
CO2: 23 mmol/L (ref 22–32)
Calcium: 8.6 mg/dL — ABNORMAL LOW (ref 8.9–10.3)
Creatinine, Ser: 1.19 mg/dL (ref 0.61–1.24)
GFR, EST NON AFRICAN AMERICAN: 54 mL/min — AB (ref >60)
Glucose, Bld: 113 mg/dL — ABNORMAL HIGH (ref 65–99)
POTASSIUM: 4.2 mmol/L (ref 3.5–5.1)
SODIUM: 143 mmol/L (ref 135–145)

## 2015-05-10 LAB — CLOSTRIDIUM DIFFICILE BY PCR: Toxigenic C. Difficile by PCR: POSITIVE — AB

## 2015-05-10 MED ORDER — SACCHAROMYCES BOULARDII 250 MG PO CAPS
250.0000 mg | ORAL_CAPSULE | Freq: Two times a day (BID) | ORAL | Status: DC
  Administered 2015-05-10 – 2015-05-20 (×21): 250 mg via ORAL
  Filled 2015-05-10 (×21): qty 1

## 2015-05-10 MED ORDER — METRONIDAZOLE 500 MG PO TABS
500.0000 mg | ORAL_TABLET | Freq: Three times a day (TID) | ORAL | Status: DC
  Administered 2015-05-10 – 2015-05-20 (×29): 500 mg via ORAL
  Filled 2015-05-10 (×30): qty 1

## 2015-05-10 NOTE — Care Management Note (Signed)
Medicare Important Message given? YES  Date Medicare IM given: 05/10/15 Medicare IM given by: Richie Bonanno RN CCM 

## 2015-05-10 NOTE — Progress Notes (Signed)
Progress Note   [name] [name] GYF:[id number] DOB: 10-12-1929 DOA: 05/05/2015 PCP: Abigail Miyamoto, MD   Brief Narrative:   [name] [name] is an 81 y.o. male with h/o GIST tumor, peritoneal carcinomatosis, being treated with [place] who was admitted 05/05/15 with a chief complaint of abdominal pain associated with nausea.  Assessment/Plan:   Principal Problem:   Sepsis vs SIRS secondary to appendicitis versus abdominal carcinomatosis with associated abdominal pain - Status post vigorous hydration in the ED. Blood cultures negative to date. - IR consulted for paracentesis to rule out SBP.  Only trace ascites, so could not be done. - On empiric zosyn to appendicitis although not entirely clear based on CT findings. - Foley catheter now placed for urinary retention, which may have been contributory. - General surgery following with no current plans for surgical exploration. - Dulcolax suppository given per surgical recommendations. Continue conservative care. - Continue pain control efforts with Dilaudid.  Abdomen very distended. - KUB now shows ileus vs. Partial SBO. C. Diff +, start flagyl.  Active Problems:   C. Diff diarrhea - Start Flagyl.  Contact isolation.    Urinary retention, likely secondary to pain medication adverse effect versus tumor causing obstruction of urethra - Foley catheter placed 05/07/15. - Renal ultrasound negative for hydronephrosis.    Chronic anemia, multifactorial with recent ABLA, anemia of chronic disease contributory - Hemoglobin drop likely dilutional.  Baseline hemoglobin 10.9.  Current hemoglobin 8.9.    Acute kidney failure, pre-renal from dehydration in the setting of stage III CKD - Renal function improved with IVF.  Baseline creatinine 1.2.  Creatinine back to usual baseline values.    GIST (gastrointestinal stromal tumor) of small bowel, malignant - Dr. [name] following.  [place] resumed.    Hypertension - Continue metoprolol and Norvasc.  Hydrochlorothiazide on hold.    Hyperlipidemia - Continue Zocor.    DVT Prophylaxis - SCDs ordered.  Code Status: Full. Family Communication: Son updated at the bedside. Disposition Plan: Home when stable and pain improved and showing signs of clinical improvement, likely several more days.  IV Access:    Peripheral IV   Procedures and diagnostic studies:   Ct Abdomen Pelvis Wo Contrast  05/06/2015   CLINICAL DATA:  Mid right-sided abdominal pain, onset tonight. Nausea without vomiting. History of GI stromal tumor of small bowel.  EXAM: CT ABDOMEN AND PELVIS WITHOUT CONTRAST  TECHNIQUE: Multidetector CT imaging of the abdomen and pelvis was performed following the standard protocol without IV contrast.  COMPARISON:  CT 01/13/2015  FINDINGS: Resolution of previous left pleural effusion. Depending ground-glass opacity in the right lower lobe consistent with atelectasis. The heart is enlarged.  Lack of IV contrast limits assessment for focal hepatic lesion, allowing for this, no focal lesion is seen. Gallstones are seen within the gallbladder which is physiologically distended. Small amount perihepatic ascites, the degree of which is slightly increased from prior exam. Spleen is normal in size, development of small volume of perisplenic ascites.  Mesenteric and omental edema and ill-defined soft tissue omental densities consistent with known peritoneal disease. Soft tissue nodule in the midline upper abdomen measures 2.8 x 2.2 cm, previously 3.5 x 2.3 cm.  Small and large bowel are suboptimally assessed given lack contrast and haziness throughout the abdomen. The stomach through is distended with fluid, small amount of fluid in the distal esophagus is small hiatal hernia. Enteric anastomosis in the mid to left abdomen. Bowel loops in the right lower quadrant of the abdomen  are suboptimally defined. There is hyperattenuation within a tubular structure in the right lower quadrant that may reflect the  appendix, however neoplasm is not entirely excluded. There is increased soft tissue density/fluid and edema about this structure which may reflect increased ascites. Edema and fluid tracks in the right inguinal canal with possible peritoneal implant in the inguinal canal. There are multiple colonic diverticula containing retained barium from prior exams.  No adrenal nodule. Pancreas is normal. There is no hydronephrosis, kidneys are symmetric in size. Questionable soft tissue density in the region of the porta, may reflect periportal lymph nodes.  Dense atherosclerosis of the abdominal aorta without aneurysm. Small retroperitoneal soft tissue densities.  In the pelvis the bladder is physiologically distended. Prostate gland is normal in size. Small amount pelvic ascites.  No lytic or blastic osseous lesions. Degenerative change in the spine.  IMPRESSION: 1. Increased intra-abdominal ascites in the right and left upper quadrant and right lower quadrant, the degree of which remains mild. There is increased peritoneal and omental edema of the right lower quadrant. This edema and soft tissue density tracks into the right inguinal canal which may reflect progression of known metastatic disease, versus decompressed small bowel tracking in the right inguinal hernia. No proximal small bowel dilatation to suggest obstruction. 2. There is however interval improvement in size of dominant omental nodule in the anterior upper abdomen from prior. 3. Tubular structure in the right lower quadrant containing high-density. This high-density was seen on prior exam, it is unclear whether this represents the appendix with retained barium versus related to neoplasm or metastatic disease. There is overall increase in the fluid and soft tissue in the right lower quadrant, in addition adjacent to these tubular structure, however the appearance does not favor that of appendicitis. 4. Cholelithiasis without findings of acute cholecystitis.  Diverticulosis without diverticulitis.   Electronically Signed   By: Jeb Levering M.D.   On: 05/06/2015 00:17   US Renal  05/07/2015   CLINICAL DATA:  Urinary retention.  EXAM: RENAL / URINARY TRACT ULTRASOUND COMPLETE  COMPARISON:  CT scan abdomen dated 05/05/2015  FINDINGS: Right Kidney:  Length: 10.9 cm. Echogenicity within normal limits. No mass or hydronephrosis visualized.  Left Kidney:  Length: 11.5 cm. Echogenicity within normal limits. No mass or hydronephrosis visualized.  Bladder:  The bladder is empty with a Foley catheter in place.  IMPRESSION: Normal kidneys.  No hydronephrosis or other abnormality.   Electronically Signed   By: Lorriane Shire M.D.   On: 05/07/2015 17:27   US Abdomen Limited  05/06/2015   CLINICAL DATA:  Abdominal pain.  Evaluate for pocket of ascites.  EXAM: LIMITED ABDOMEN ULTRASOUND FOR ASCITES  TECHNIQUE: Limited ultrasound survey for ascites was performed in all four abdominal quadrants.  COMPARISON:  CT abdomen 05/05/2015.  FINDINGS: A drainable pocket of ascites was not identified in any of the 4 quadrants.  IMPRESSION: As above.   Electronically Signed   By: Rolla Flatten M.D.   On: 05/06/2015 12:45     Medical Consultants:    Oncology  Anti-Infectives:    Zosyn 05/05/15--->  Subjective:   [name] [name] had "explosive" diarrhea earlier today.  Abdominal pain improved.  Tolerating [place] diet.  No N/V.  Objective:    Filed Vitals:   05/09/15 2033 05/09/15 2056 05/10/15 0500 05/10/15 0510  BP: 151/54   138/63  Pulse: 98   92  Temp: 98.9 F (37.2 C)   98 F (36.7 C)  TempSrc: Oral  Oral  Resp: 20   19  Height:      Weight:   75 kg (165 lb 5.5 oz)   SpO2: 95% 95%  94%    Intake/Output Summary (Last 24 hours) at 05/10/15 0842 Last data filed at 05/10/15 4580  Gross per 24 hour  Intake   2120 ml  Output   1352 ml  Net    768 ml    Exam: Gen:  NAD, more awake Cardiovascular:  Tachy, No M/R/G Respiratory:  Lungs CTAB Gastrointestinal:   Abdomen firm/distended, mildly tender, +BS Extremities:  1+ edema BLE, right hand swollen   Data Reviewed:    Labs: Basic Metabolic Panel:  Recent Labs Lab 05/05/15 2137 05/07/15 0539 05/08/15 0404 05/10/15 0537  NA 139 139 140 143  K 3.1* 4.2 4.1 4.2  CL 100* 106 108 113*  CO2 27 25 24 23   GLUCOSE 188* 144* 144* 113*  BUN 21* 32* 32* 28*  CREATININE 1.34* 1.23 1.17 1.19  CALCIUM 8.9 7.7* 8.1* 8.6*   GFR Estimated Creatinine Clearance: 43.2 mL/min (by C-G formula based on Cr of 1.19). Liver Function Tests:  Recent Labs Lab 05/05/15 2137  AST 26  ALT 27  ALKPHOS 84  BILITOT 1.0  PROT 7.1  ALBUMIN 4.1    Recent Labs Lab 05/05/15 2137  LIPASE 21*   Coagulation profile  Recent Labs Lab 05/06/15 0304  INR 1.14    CBC:  Recent Labs Lab 05/05/15 2137 05/07/15 0539 05/08/15 0404 05/10/15 0537  WBC 7.3 10.4 11.0* 9.9  NEUTROABS 5.6  --   --   --   HGB 12.0* 9.5* 9.4* 8.9*  HCT 35.6* 28.5* 27.7* 27.0*  MCV 104.1* 105.2* 104.1* 104.7*  PLT 236 158 161 189   BNP (last 3 results)  Recent Labs  09/12/14 0445  PROBNP 5726.0*   Sepsis Labs:  Recent Labs Lab 05/05/15 2137 05/06/15 0033 05/07/15 0539 05/08/15 0404 05/10/15 0537  WBC 7.3  --  10.4 11.0* 9.9  LATICACIDVEN  --  1.74  --   --   --    Microbiology Recent Results (from the past 240 hour(s))  Urine culture     Status: None   Collection Time: 05/06/15  1:40 AM  Result Value Ref Range Status   Specimen Description URINE, CATHETERIZED  Final   Special Requests NONE  Final   [place] Count NO GROWTH Performed at [facility]   Final   Culture NO GROWTH Performed at [facility]   Final   Report Status 05/07/2015 FINAL  Final  Culture, blood (routine x 2)     Status: None (Preliminary result)   Collection Time: 05/06/15  3:04 AM  Result Value Ref Range Status   Specimen Description BLOOD RIGHT HAND  Final   Special Requests Immunocompromised  Final   Culture    Final           BLOOD CULTURE RECEIVED NO GROWTH TO DATE CULTURE WILL BE HELD FOR 5 DAYS BEFORE ISSUING A FINAL NEGATIVE REPORT Performed at [facility]    Report Status PENDING  Incomplete  Culture, blood (routine x 2)     Status: None (Preliminary result)   Collection Time: 05/06/15  3:04 AM  Result Value Ref Range Status   Specimen Description LEFT ANTECUBITAL  Final   Special Requests Immunocompromised  Final   Culture   Final           BLOOD CULTURE RECEIVED NO GROWTH TO  DATE CULTURE WILL BE HELD FOR 5 DAYS BEFORE ISSUING A FINAL NEGATIVE REPORT Performed at [facility]    Report Status PENDING  Incomplete     Medications:   . amLODipine  5 mg Oral Daily  . bisacodyl  10 mg Rectal Once  . bisacodyl  10 mg Rectal Daily  . imatinib  400 mg Oral Q breakfast  . metoprolol tartrate  25 mg Oral BID  . piperacillin-tazobactam (ZOSYN)  IV  3.375 g Intravenous 3 times per day  . simvastatin  20 mg Oral QPM  . sodium chloride  3 mL Intravenous Q12H   Continuous Infusions: . 0.9 % NaCl with KCl 40 mEq / L 75 mL/hr (05/10/15 0519)    Time spent: 25 minutes.   LOS: 4 days   [facility] Pager [phone]. If unable to reach me by pager, please call my cell phone at [phone].  *Please refer to [email or web address], password [email or web address] to get updated schedule on who will round on this patient, as hospitalists switch teams weekly. If 7PM-7AM, please contact night-coverage at [email or web address], password [email or web address] for any overnight needs.  05/10/2015, 8:42 AM

## 2015-05-10 NOTE — Progress Notes (Signed)
Patient ID: [name] [name], male   DOB: [birth year], 81 y.o.   MRN: [id number]    Subjective: Overall his pain is much improved from admission although still somewhat sore in the right lower quadrant. He developed frequent diarrhea last night and today. Also abdomen is bloated. No nausea or vomiting. Taking some full liquids by mouth. Son is in the room and states he is much brighter and more himself today.  Objective: Vital signs in last 24 hours: Temp:  [98 F (36.7 C)-99.6 F (37.6 C)] 98 F (36.7 C) (05/22 0510) Pulse Rate:  [92-98] 92 (05/22 0510) Resp:  [19-20] 19 (05/22 0510) BP: (138-154)/(54-70) 138/63 mmHg (05/22 0510) SpO2:  [94 %-97 %] 94 % (05/22 0510) Weight:  [75 kg (165 lb 5.5 oz)] 75 kg (165 lb 5.5 oz) (05/22 0500) Last BM Date: 05/09/15  Intake/Output from previous day: 05/21 0701 - 05/22 0700 In: 2120 [P.O.:360; I.V.:1760] Out: 7591 [Urine:1350; Stool:2] Intake/Output this shift: Total I/O In: -  Out: 1 [Stool:1]  General appearance: alert, cooperative and no distress GI: distended and tympanitic but generally nontender. Mild tenderness right lower quadrant without guarding.  Lab Results:   Recent Labs  05/08/15 0404 05/10/15 0537  WBC 11.0* 9.9  HGB 9.4* 8.9*  HCT 27.7* 27.0*  PLT 161 189   BMET  Recent Labs  05/08/15 0404 05/10/15 0537  NA 140 143  K 4.1 4.2  CL 108 113*  CO2 24 23  GLUCOSE 144* 113*  BUN 32* 28*  CREATININE 1.17 1.19  CALCIUM 8.1* 8.6*     Studies/Results: No results found.  Anti-infectives: Anti-infectives    Start     Dose/Rate Route Frequency Ordered Stop   05/07/15 1045  ciprofloxacin (CIPRO) IVPB 400 mg  Status:  Discontinued     400 mg 200 mL/hr over 60 Minutes Intravenous Every 12 hours 05/07/15 1032 05/07/15 1032   05/07/15 1045  metroNIDAZOLE (FLAGYL) IVPB 500 mg  Status:  Discontinued     500 mg 100 mL/hr over 60 Minutes Intravenous Every 8 hours 05/07/15 1032 05/07/15 1032   05/06/15 1400   piperacillin-tazobactam (ZOSYN) IVPB 3.375 g     3.375 g 12.5 mL/hr over 240 Minutes Intravenous 3 times per day 05/06/15 0535     05/06/15 0300  vancomycin (VANCOCIN) IVPB 1000 mg/200 mL premix     1,000 mg 200 mL/hr over 60 Minutes Intravenous  Once 05/06/15 0247 05/06/15 0419   05/06/15 0245  piperacillin-tazobactam (ZOSYN) IVPB 3.375 g     3.375 g 12.5 mL/hr over 240 Minutes Intravenous  Once 05/06/15 0247 05/06/15 6384      Assessment/Plan: Possible appendicitis with difficult background of 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 Treating nonoperatively with antibiotics. Overall his pain and tenderness is improved and white count is normal. Has new onset of diarrhea and some abdominal distention. C. Difficile being ruled out. Check KUB today.    LOS: 4 days    Allaya Abbasi T 05/10/2015

## 2015-05-11 LAB — CBC
HCT: 27.5 % — ABNORMAL LOW (ref 39.0–52.0)
Hemoglobin: 9.3 g/dL — ABNORMAL LOW (ref 13.0–17.0)
MCH: 35.1 pg — AB (ref 26.0–34.0)
MCHC: 33.8 g/dL (ref 30.0–36.0)
MCV: 103.8 fL — AB (ref 78.0–100.0)
Platelets: 230 10*3/uL (ref 150–400)
RBC: 2.65 MIL/uL — ABNORMAL LOW (ref 4.22–5.81)
RDW: 14 % (ref 11.5–15.5)
WBC: 10 10*3/uL (ref 4.0–10.5)

## 2015-05-11 LAB — BASIC METABOLIC PANEL
Anion gap: 7 (ref 5–15)
BUN: 28 mg/dL — ABNORMAL HIGH (ref 6–20)
CALCIUM: 8.3 mg/dL — AB (ref 8.9–10.3)
CHLORIDE: 110 mmol/L (ref 101–111)
CO2: 24 mmol/L (ref 22–32)
Creatinine, Ser: 1.18 mg/dL (ref 0.61–1.24)
GFR calc Af Amer: >60 mL/min (ref >60)
GFR calc non Af Amer: 55 mL/min — ABNORMAL LOW (ref >60)
Glucose, Bld: 114 mg/dL — ABNORMAL HIGH (ref 65–99)
POTASSIUM: 4.1 mmol/L (ref 3.5–5.1)
Sodium: 141 mmol/L (ref 135–145)

## 2015-05-11 MED ORDER — CEFTRIAXONE SODIUM IN DEXTROSE 20 MG/ML IV SOLN
1.0000 g | INTRAVENOUS | Status: DC
  Administered 2015-05-11 – 2015-05-19 (×9): 1 g via INTRAVENOUS
  Filled 2015-05-11 (×10): qty 50

## 2015-05-11 NOTE — Progress Notes (Signed)
[facility] Progress Note     Subjective: Pt's pain improved but still has some in RLQ, no N/V, says he's more hungry than Friday.  Ambulating some OOB.  Feels bloated, although having flatus and had 2 BM's on 5/21and one yesterday.  Had stool checked for c.diff and it was positive.  Objective: Vital signs in last 24 hours: Temp:  [98.7 F (37.1 C)-99.8 F (37.7 C)] 98.7 F (37.1 C) (05/23 0509) Pulse Rate:  [93-95] 95 (05/23 0509) Resp:  [20] 20 (05/23 0509) BP: (154-174)/(55-68) 154/68 mmHg (05/23 0509) SpO2:  [96 %-97 %] 96 % (05/23 0509) Weight:  [76.567 kg (168 lb 12.8 oz)] 76.567 kg (168 lb 12.8 oz) (05/23 0514) Last BM Date: 05/10/15 (loose stools)  Intake/Output from previous day: 05/22 0701 - 05/23 0700 In: 1300 [P.O.:600; I.V.:600; IV Piggyback:100] Out: 1401 [Urine:1400; Stool:1] Intake/Output this shift:    PE: Gen:  Alert, NAD, pleasant Abd: Soft, moderately distended, moderate tenderness in the RLQ, +BS, no HSM   Lab Results:   Recent Labs  05/10/15 0537 05/11/15 0546  WBC 9.9 10.0  HGB 8.9* 9.3*  HCT 27.0* 27.5*  PLT 189 230   BMET  Recent Labs  05/10/15 0537 05/11/15 0546  NA 143 141  K 4.2 4.1  CL 113* 110  CO2 23 24  GLUCOSE 113* 114*  BUN 28* 28*  CREATININE 1.19 1.18  CALCIUM 8.6* 8.3*   PT/INR No results for input(s): LABPROT, INR in the last 72 hours. CMP     Component Value Date/Time   NA 141 05/11/2015 0546   NA 145 04/09/2015 0900   K 4.1 05/11/2015 0546   K 4.1 04/09/2015 0900   CL 110 05/11/2015 0546   CO2 24 05/11/2015 0546   CO2 27 04/09/2015 0900   GLUCOSE 114* 05/11/2015 0546   GLUCOSE 116 04/09/2015 0900   BUN 28* 05/11/2015 0546   BUN 19.3 04/09/2015 0900   CREATININE 1.18 05/11/2015 0546   CREATININE 1.2 04/09/2015 0900   CREATININE 1.18 04/25/2013 1041   CALCIUM 8.3* 05/11/2015 0546   CALCIUM 8.6 04/09/2015 0900   PROT 7.1 05/05/2015 2137   PROT 6.3* 04/09/2015 0900   ALBUMIN 4.1  05/05/2015 2137   ALBUMIN 3.6 04/09/2015 0900   AST 26 05/05/2015 2137   AST 26 04/09/2015 0900   ALT 27 05/05/2015 2137   ALT 34 04/09/2015 0900   ALKPHOS 84 05/05/2015 2137   ALKPHOS 79 04/09/2015 0900   BILITOT 1.0 05/05/2015 2137   BILITOT 0.55 04/09/2015 0900   GFRNONAA 55* 05/11/2015 0546   GFRAA >60 05/11/2015 0546   Lipase     Component Value Date/Time   LIPASE 21* 05/05/2015 2137       Studies/Results: Dg Abd 1 View  05/10/2015   CLINICAL DATA:  Abdominal distention. 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  EXAM: ABDOMEN - 1 VIEW  COMPARISON:  Abdomen and pelvis CT dated 05/05/2015.  FINDINGS: Mildly dilated small bowel loops in the left upper abdomen. Normal caliber gas filled right and transverse colon. Unremarkable bones.  IMPRESSION: Mild proximal small bowel ileus or partial obstruction.   Electronically Signed   By: Claudie Revering M.D.   On: 05/10/2015 11:47    Anti-infectives: Anti-infectives    Start     Dose/Rate Route Frequency Ordered Stop   05/10/15 1500  metroNIDAZOLE (FLAGYL) tablet 500 mg     500 mg Oral 3 times per day 05/10/15 1354 05/24/15 1359   05/07/15 1045  ciprofloxacin (CIPRO) IVPB 400 mg  Status:  Discontinued     400 mg 200 mL/hr over 60 Minutes Intravenous Every 12 hours 05/07/15 1032 05/07/15 1032   05/07/15 1045  metroNIDAZOLE (FLAGYL) IVPB 500 mg  Status:  Discontinued     500 mg 100 mL/hr over 60 Minutes Intravenous Every 8 hours 05/07/15 1032 05/07/15 1032   05/06/15 1400  piperacillin-tazobactam (ZOSYN) IVPB 3.375 g     3.375 g 12.5 mL/hr over 240 Minutes Intravenous 3 times per day 05/06/15 0535     05/06/15 0300  vancomycin (VANCOCIN) IVPB 1000 mg/200 mL premix     1,000 mg 200 mL/hr over 60 Minutes Intravenous  Once 05/06/15 0247 05/06/15 0419   05/06/15 0245  piperacillin-tazobactam (ZOSYN) IVPB 3.375 g     3.375 g 12.5 mL/hr over 240 Minutes Intravenous  Once 05/06/15 0247 05/06/15 9532        Assessment/Plan Abdominal pain, appendicitis vs pain from carcinomatosis -The patient's history and symptoms seem consistent with appendicitis; however, it is possible this is all related to his carcinomatosis as well.  -Last CT done on 05/05/15, consider repeat CT tomorrow -Conservative management with bowel rest and abx therapy (Zosyn Day #6)  -Tolerating minimal amounts of full liquids -No urgent surgical needs -IVF, pain control but limit narcotics, antiemetics -Encouraged ambulation and IS  Leukocytosis - WBC normal today at 10.0 GIST tumors with carcinomatosis with recent intraperitoneal hemorrhage C.diff -started on flagyl PO Day #2 -question this as cause of pain originally  Son at bedside. Dr. [name] did his surgery back in 06/04/2013 (Ex lap with SBR)    LOS: 5 days    DORT, Jinny Blossom 05/11/2015, 8:05 AM Pager: [phone]

## 2015-05-11 NOTE — Progress Notes (Signed)
Progress Note   [name] [name] OFB:[id number] DOB: July 24, 1929 DOA: 05/05/2015 PCP: Abigail Miyamoto, MD   Brief Narrative:   [name] [name] is an 81 y.o. male with h/o GIST tumor, peritoneal carcinomatosis, being treated with [place] who was admitted 05/05/15 with a chief complaint of abdominal pain associated with nausea.  Assessment/Plan:   Principal Problem:   Sepsis vs SIRS secondary to appendicitis versus abdominal carcinomatosis with associated abdominal pain - Status post vigorous hydration in the ED. Blood cultures negative to date. - IR consulted for paracentesis to rule out SBP.  Only trace ascites, so could not be done. - On empiric zosyn to appendicitis although not entirely clear based on CT findings.  Narrow to Rocephin given C diff +. - Foley catheter now placed for urinary retention, which may have been contributory. - General surgery following with no current plans for surgical exploration. - Continue pain control efforts with Dilaudid.  Abdomen very distended. - KUB 05/10/15 showed ileus vs. Partial SBO. C. Diff +, flagyl started 05/10/15.  Active Problems:   C. Diff diarrhea - Continue Flagyl.  Contact isolation.    Urinary retention, likely secondary to pain medication adverse effect versus tumor causing obstruction of urethra - Foley catheter placed 05/07/15. - Renal ultrasound negative for hydronephrosis.    Chronic anemia, multifactorial with recent ABLA, anemia of chronic disease contributory - Hemoglobin drop likely dilutional.  Baseline hemoglobin 10.9.  Current hemoglobin 8.9.    Acute kidney failure, pre-renal from dehydration in the setting of stage III CKD - Renal function improved with IVF.  Baseline creatinine 1.2.  Creatinine back to usual baseline values.    GIST (gastrointestinal stromal tumor) of small bowel, malignant - Dr. [name] following.  [name] resumed.    Hypertension - Continue metoprolol and Norvasc. Hydrochlorothiazide on hold.     Hyperlipidemia - Continue Zocor.    DVT Prophylaxis - SCDs ordered.  Code Status: Full. Family Communication: Son updated at the bedside 05/10/15, no family at bedside today. Disposition Plan: Home when stable and pain improved and showing signs of clinical improvement, likely several more days.  IV Access:    Peripheral IV   Procedures and diagnostic studies:   Ct Abdomen Pelvis Wo Contrast  05/06/2015   CLINICAL DATA:  Mid right-sided abdominal pain, onset tonight. Nausea without vomiting. History of GI stromal tumor of small bowel.  EXAM: CT ABDOMEN AND PELVIS WITHOUT CONTRAST  TECHNIQUE: Multidetector CT imaging of the abdomen and pelvis was performed following the standard protocol without IV contrast.  COMPARISON:  CT 01/13/2015  FINDINGS: Resolution of previous left pleural effusion. Depending ground-glass opacity in the right lower lobe consistent with atelectasis. The heart is enlarged.  Lack of IV contrast limits assessment for focal hepatic lesion, allowing for this, no focal lesion is seen. Gallstones are seen within the gallbladder which is physiologically distended. Small amount perihepatic ascites, the degree of which is slightly increased from prior exam. Spleen is normal in size, development of small volume of perisplenic ascites.  Mesenteric and omental edema and ill-defined soft tissue omental densities consistent with known peritoneal disease. Soft tissue nodule in the midline upper abdomen measures 2.8 x 2.2 cm, previously 3.5 x 2.3 cm.  Small and large bowel are suboptimally assessed given lack contrast and haziness throughout the abdomen. The stomach through is distended with fluid, small amount of fluid in the distal esophagus is small hiatal hernia. Enteric anastomosis in the mid to left abdomen. Bowel loops in the right  lower quadrant of the abdomen are suboptimally defined. There is hyperattenuation within a tubular structure in the right lower quadrant that may reflect  the appendix, however neoplasm is not entirely excluded. There is increased soft tissue density/fluid and edema about this structure which may reflect increased ascites. Edema and fluid tracks in the right inguinal canal with possible peritoneal implant in the inguinal canal. There are multiple colonic diverticula containing retained barium from prior exams.  No adrenal nodule. Pancreas is normal. There is no hydronephrosis, kidneys are symmetric in size. Questionable soft tissue density in the region of the porta, may reflect periportal lymph nodes.  Dense atherosclerosis of the abdominal aorta without aneurysm. Small retroperitoneal soft tissue densities.  In the pelvis the bladder is physiologically distended. Prostate gland is normal in size. Small amount pelvic ascites.  No lytic or blastic osseous lesions. Degenerative change in the spine.  IMPRESSION: 1. Increased intra-abdominal ascites in the right and left upper quadrant and right lower quadrant, the degree of which remains mild. There is increased peritoneal and omental edema of the right lower quadrant. This edema and soft tissue density tracks into the right inguinal canal which may reflect progression of known metastatic disease, versus decompressed small bowel tracking in the right inguinal hernia. No proximal small bowel dilatation to suggest obstruction. 2. There is however interval improvement in size of dominant omental nodule in the anterior upper abdomen from prior. 3. Tubular structure in the right lower quadrant containing high-density. This high-density was seen on prior exam, it is unclear whether this represents the appendix with retained barium versus related to neoplasm or metastatic disease. There is overall increase in the fluid and soft tissue in the right lower quadrant, in addition adjacent to these tubular structure, however the appearance does not favor that of appendicitis. 4. Cholelithiasis without findings of acute  cholecystitis. Diverticulosis without diverticulitis.   Electronically Signed   By: Jeb Levering M.D.   On: 05/06/2015 00:17   Dg Abd 1 View  05/10/2015   CLINICAL DATA:  Abdominal distention. 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  EXAM: ABDOMEN - 1 VIEW  COMPARISON:  Abdomen and pelvis CT dated 05/05/2015.  FINDINGS: Mildly dilated small bowel loops in the left upper abdomen. Normal caliber gas filled right and transverse colon. Unremarkable bones.  IMPRESSION: Mild proximal small bowel ileus or partial obstruction.   Electronically Signed   By: Claudie Revering M.D.   On: 05/10/2015 11:47   US Renal  05/07/2015   CLINICAL DATA:  Urinary retention.  EXAM: RENAL / URINARY TRACT ULTRASOUND COMPLETE  COMPARISON:  CT scan abdomen dated 05/05/2015  FINDINGS: Right Kidney:  Length: 10.9 cm. Echogenicity within normal limits. No mass or hydronephrosis visualized.  Left Kidney:  Length: 11.5 cm. Echogenicity within normal limits. No mass or hydronephrosis visualized.  Bladder:  The bladder is empty with a Foley catheter in place.  IMPRESSION: Normal kidneys.  No hydronephrosis or other abnormality.   Electronically Signed   By: Lorriane Shire M.D.   On: 05/07/2015 17:27   US Abdomen Limited  05/06/2015   CLINICAL DATA:  Abdominal pain.  Evaluate for pocket of ascites.  EXAM: LIMITED ABDOMEN ULTRASOUND FOR ASCITES  TECHNIQUE: Limited ultrasound survey for ascites was performed in all four abdominal quadrants.  COMPARISON:  CT abdomen 05/05/2015.  FINDINGS: A drainable pocket of ascites was not identified in any of the 4 quadrants.  IMPRESSION: As above.   Electronically Signed   By: Rolla Flatten  M.D.   On: 05/06/2015 12:45     Medical Consultants:    Oncology  Surgery  Anti-Infectives:    Zosyn 05/05/15--->05/11/15  Rocephin 05/11/15--->  Flagyl 05/10/15--->  Subjective:   Beau Fanny had "explosive" diarrhea yesterday, 2 stools total, + for C diff.  No diarrhea today.  No  nausea/vomiting.  Ambulated in halls, still with abdominal soreness and bloating.  Passing flatus.  Objective:    Filed Vitals:   05/10/15 1527 05/10/15 2109 05/11/15 0509 05/11/15 0514  BP: 174/61 156/55 154/68   Pulse: 93 95 95   Temp: 99.8 F (37.7 C) 99.4 F (37.4 C) 98.7 F (37.1 C)   TempSrc: Oral Oral Oral   Resp: 20 20 20    Height:      Weight:    76.567 kg (168 lb 12.8 oz)  SpO2: 97% 96% 96%     Intake/Output Summary (Last 24 hours) at 05/11/15 0814 Last data filed at 05/11/15 0700  Gross per 24 hour  Intake   1300 ml  Output   1401 ml  Net   -101 ml    Exam: Gen:  NAD, more awake Cardiovascular:  Tachy, No M/R/G Respiratory:  Lungs CTAB Gastrointestinal:  Abdomen distended, tender RLQ, +BS Extremities:  1+ edema BLE, right hand swollen   Data Reviewed:    Labs: Basic Metabolic Panel:  Recent Labs Lab 05/05/15 2137 05/07/15 0539 05/08/15 0404 05/10/15 0537 05/11/15 0546  NA 139 139 140 143 141  K 3.1* 4.2 4.1 4.2 4.1  CL 100* 106 108 113* 110  CO2 27 25 24 23 24   GLUCOSE 188* 144* 144* 113* 114*  BUN 21* 32* 32* 28* 28*  CREATININE 1.34* 1.23 1.17 1.19 1.18  CALCIUM 8.9 7.7* 8.1* 8.6* 8.3*   GFR Estimated Creatinine Clearance: 43.6 mL/min (by C-G formula based on Cr of 1.18). Liver Function Tests:  Recent Labs Lab 05/05/15 2137  AST 26  ALT 27  ALKPHOS 84  BILITOT 1.0  PROT 7.1  ALBUMIN 4.1    Recent Labs Lab 05/05/15 2137  LIPASE 21*   Coagulation profile  Recent Labs Lab 05/06/15 0304  INR 1.14    CBC:  Recent Labs Lab 05/05/15 2137 05/07/15 0539 05/08/15 0404 05/10/15 0537 05/11/15 0546  WBC 7.3 10.4 11.0* 9.9 10.0  NEUTROABS 5.6  --   --   --   --   HGB 12.0* 9.5* 9.4* 8.9* 9.3*  HCT 35.6* 28.5* 27.7* 27.0* 27.5*  MCV 104.1* 105.2* 104.1* 104.7* 103.8*  PLT 236 158 161 189 230   BNP (last 3 results)  Recent Labs  09/12/14 0445  PROBNP 5726.0*   Sepsis Labs:  Recent Labs Lab 05/06/15 0033  05/07/15 0539 05/08/15 0404 05/10/15 0537 05/11/15 0546  WBC  --  10.4 11.0* 9.9 10.0  LATICACIDVEN 1.74  --   --   --   --    Microbiology Recent Results (from the past 240 hour(s))  Urine culture     Status: None   Collection Time: 05/06/15  1:40 AM  Result Value Ref Range Status   Specimen Description URINE, CATHETERIZED  Final   Special Requests NONE  Final   Colony Count NO GROWTH Performed at [facility]   Final   Culture NO GROWTH Performed at [facility]   Final   Report Status 05/07/2015 FINAL  Final  Culture, blood (routine x 2)     Status: None (Preliminary result)   Collection Time: 05/06/15  3:04 AM  Result Value Ref Range Status   Specimen Description BLOOD RIGHT HAND  Final   Special Requests Immunocompromised  Final   Culture   Final           BLOOD CULTURE RECEIVED NO GROWTH TO DATE CULTURE WILL BE HELD FOR 5 DAYS BEFORE ISSUING A FINAL NEGATIVE REPORT Performed at [facility]    Report Status PENDING  Incomplete  Culture, blood (routine x 2)     Status: None (Preliminary result)   Collection Time: 05/06/15  3:04 AM  Result Value Ref Range Status   Specimen Description LEFT ANTECUBITAL  Final   Special Requests Immunocompromised  Final   Culture   Final           BLOOD CULTURE RECEIVED NO GROWTH TO DATE CULTURE WILL BE HELD FOR 5 DAYS BEFORE ISSUING A FINAL NEGATIVE REPORT Performed at [facility]    Report Status PENDING  Incomplete  Clostridium Difficile by PCR     Status: Abnormal   Collection Time: 05/10/15 11:00 AM  Result Value Ref Range Status   C difficile by pcr POSITIVE (A) NEGATIVE Final    Comment: CRITICAL RESULT CALLED TO, READ BACK BY AND VERIFIED WITH: I HABIB AT 1340 ON 05.22.2016 BY NBROOKS      Medications:   . amLODipine  5 mg Oral Daily  . imatinib  400 mg Oral Q breakfast  . metoprolol tartrate  25 mg Oral BID  . metroNIDAZOLE  500 mg Oral 3 times per day  . piperacillin-tazobactam  (ZOSYN)  IV  3.375 g Intravenous 3 times per day  . saccharomyces boulardii  250 mg Oral BID  . simvastatin  20 mg Oral QPM  . sodium chloride  3 mL Intravenous Q12H   Continuous Infusions: . 0.9 % NaCl with KCl 40 mEq / L 50 mL/hr (05/10/15 2110)    Time spent: 25 minutes.   LOS: 5 days   [facility] Pager [phone]. If unable to reach me by pager, please call my cell phone at [phone].  *Please refer to [email or web address], password [email or web address] to get updated schedule on who will round on this patient, as hospitalists switch teams weekly. If 7PM-7AM, please contact night-coverage at [email or web address], password [email or web address] for any overnight needs.  05/11/2015, 8:14 AM

## 2015-05-11 NOTE — Progress Notes (Signed)
Physical Therapy Treatment Patient Details Name: [name] [name] MRN: [id number] DOB: 14-Feb-1929 Today's Date: 05/11/2015    History of Present Illness 81 yo male admitted with SIRS. hx of NSTEMI, HTN, abdominal carcinomatosis.     PT Comments    Assisted pt OOB to amb full unit.  Slow but steady gait with intermittent R LQ ABD discomfort.  Pt declined the need for RW however did hold to IV poll.   Follow Up Recommendations  Home health PT     Equipment Recommendations  None recommended by PT    Recommendations for Other Services       Precautions / Restrictions Precautions Precautions: Fall Restrictions Weight Bearing Restrictions: No    Mobility  Bed Mobility Overal bed mobility: Needs Assistance Bed Mobility: Supine to Sit;Sit to Supine     Supine to sit: Min assist;Min guard Sit to supine: Min assist;Min guard   General bed mobility comments: increased time and assist with B LE's back onto bed.  Transfers Overall transfer level: Needs assistance Equipment used: None Transfers: Sit to/from Stand Sit to Stand: Min assist         General transfer comment: 25% Vc's on proper hand placement to incearse self rise  Ambulation/Gait Ambulation/Gait assistance: Min guard Ambulation Distance (Feet): 500 Feet Assistive device: None Gait Pattern/deviations: Step-through pattern;Decreased stride length;Trunk flexed Gait velocity: decreased   General Gait Details: holding to IV pole.  Increased time.  slow but steady gait.    Stairs            Wheelchair Mobility    Modified Rankin (Stroke Patients Only)       Balance                                    Cognition Arousal/Alertness: Awake/alert Behavior During Therapy: WFL for tasks assessed/performed Overall Cognitive Status: Within Functional Limits for tasks assessed                      Exercises      General Comments        Pertinent Vitals/Pain Pain Assessment:  Faces Faces Pain Scale: Hurts little more Pain Location: R LQ ABd Pain Descriptors / Indicators: Cramping;Discomfort;Tender Pain Intervention(s): Monitored during session;Repositioned    Home Living                      Prior Function            PT Goals (current goals can now be found in the care plan section) Progress towards PT goals: Progressing toward goals    Frequency  Min 3X/week    PT Plan      Co-evaluation             End of Session Equipment Utilized During Treatment: Gait belt Activity Tolerance: Patient tolerated treatment well Patient left: in bed;with call bell/phone within reach     Time: 1135-1200 PT Time Calculation (min) (ACUTE ONLY): 25 min  Charges:  $Gait Training: 8-22 mins $Therapeutic Activity: 8-22 mins                    G Codes:      [name] [name]  PTA [facility]  Acute  Rehab Pager      [phone]

## 2015-05-12 ENCOUNTER — Inpatient Hospital Stay (HOSPITAL_COMMUNITY): Payer: Medicare Other

## 2015-05-12 LAB — CULTURE, BLOOD (ROUTINE X 2)
Culture: NO GROWTH
Culture: NO GROWTH

## 2015-05-12 MED ORDER — TRAZODONE HCL 50 MG PO TABS
50.0000 mg | ORAL_TABLET | Freq: Every evening | ORAL | Status: DC | PRN
  Administered 2015-05-12 – 2015-05-19 (×8): 50 mg via ORAL
  Filled 2015-05-12 (×8): qty 1

## 2015-05-12 NOTE — Progress Notes (Signed)
[facility] Progress Note     Subjective: He feels better this am.  Not as much pain, no N/V, tolerating full liquids, but doesn't eat much breakfast.  Ambulating 3 times in the halls yesterday.  Had a large BM this am and lots of flatus.  He feels much better overall.  Still has pain in RLQ.  Objective: Vital signs in last 24 hours: Temp:  [98.1 F (36.7 C)-99.2 F (37.3 C)] 98.1 F (36.7 C) (05/24 0452) Pulse Rate:  [82-94] 94 (05/24 0452) Resp:  [16-18] 16 (05/24 0452) BP: (142-159)/(54-69) 145/54 mmHg (05/24 0452) SpO2:  [97 %-98 %] 98 % (05/24 0452) Weight:  [76.4 kg (168 lb 6.9 oz)] 76.4 kg (168 lb 6.9 oz) (05/24 0452) Last BM Date: 05/10/15  Intake/Output from previous day: 05/23 0701 - 05/24 0700 In: 1830 [P.O.:480; I.V.:1150; IV Piggyback:200] Out: 1975 [Urine:1975] Intake/Output this shift:    PE: Gen: Alert, NAD, pleasant Abd: Soft, moderately distended, moderate tenderness in the RLQ, +BS, no HSM   Lab Results:   Recent Labs  05/10/15 0537 05/11/15 0546  WBC 9.9 10.0  HGB 8.9* 9.3*  HCT 27.0* 27.5*  PLT 189 230   BMET  Recent Labs  05/10/15 0537 05/11/15 0546  NA 143 141  K 4.2 4.1  CL 113* 110  CO2 23 24  GLUCOSE 113* 114*  BUN 28* 28*  CREATININE 1.19 1.18  CALCIUM 8.6* 8.3*   PT/INR No results for input(s): LABPROT, INR in the last 72 hours. CMP     Component Value Date/Time   NA 141 05/11/2015 0546   NA 145 04/09/2015 0900   K 4.1 05/11/2015 0546   K 4.1 04/09/2015 0900   CL 110 05/11/2015 0546   CO2 24 05/11/2015 0546   CO2 27 04/09/2015 0900   GLUCOSE 114* 05/11/2015 0546   GLUCOSE 116 04/09/2015 0900   BUN 28* 05/11/2015 0546   BUN 19.3 04/09/2015 0900   CREATININE 1.18 05/11/2015 0546   CREATININE 1.2 04/09/2015 0900   CREATININE 1.18 04/25/2013 1041   CALCIUM 8.3* 05/11/2015 0546   CALCIUM 8.6 04/09/2015 0900   PROT 7.1 05/05/2015 2137   PROT 6.3* 04/09/2015 0900   ALBUMIN 4.1 05/05/2015 2137   ALBUMIN 3.6 04/09/2015 0900   AST 26 05/05/2015 2137   AST 26 04/09/2015 0900   ALT 27 05/05/2015 2137   ALT 34 04/09/2015 0900   ALKPHOS 84 05/05/2015 2137   ALKPHOS 79 04/09/2015 0900   BILITOT 1.0 05/05/2015 2137   BILITOT 0.55 04/09/2015 0900   GFRNONAA 55* 05/11/2015 0546   GFRAA >60 05/11/2015 0546   Lipase     Component Value Date/Time   LIPASE 21* 05/05/2015 2137       Studies/Results: Dg Abd 1 View  05/10/2015   CLINICAL DATA:  Abdominal distention. 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  EXAM: ABDOMEN - 1 VIEW  COMPARISON:  Abdomen and pelvis CT dated 05/05/2015.  FINDINGS: Mildly dilated small bowel loops in the left upper abdomen. Normal caliber gas filled right and transverse colon. Unremarkable bones.  IMPRESSION: Mild proximal small bowel ileus or partial obstruction.   Electronically Signed   By: Claudie Revering M.D.   On: 05/10/2015 11:47    Anti-infectives: Anti-infectives    Start     Dose/Rate Route Frequency Ordered Stop   05/11/15 1400  cefTRIAXone (ROCEPHIN) 1 g in dextrose 5 % 50 mL IVPB - Premix     1 g 100 mL/hr over 30  Minutes Intravenous Every 24 hours 05/11/15 1211     05/10/15 1500  metroNIDAZOLE (FLAGYL) tablet 500 mg     500 mg Oral 3 times per day 05/10/15 1354 05/24/15 1359   05/07/15 1045  ciprofloxacin (CIPRO) IVPB 400 mg  Status:  Discontinued     400 mg 200 mL/hr over 60 Minutes Intravenous Every 12 hours 05/07/15 1032 05/07/15 1032   05/07/15 1045  metroNIDAZOLE (FLAGYL) IVPB 500 mg  Status:  Discontinued     500 mg 100 mL/hr over 60 Minutes Intravenous Every 8 hours 05/07/15 1032 05/07/15 1032   05/06/15 1400  piperacillin-tazobactam (ZOSYN) IVPB 3.375 g  Status:  Discontinued     3.375 g 12.5 mL/hr over 240 Minutes Intravenous 3 times per day 05/06/15 0535 05/11/15 1211   05/06/15 0300  vancomycin (VANCOCIN) IVPB 1000 mg/200 mL premix     1,000 mg 200 mL/hr over 60 Minutes Intravenous  Once 05/06/15 0247 05/06/15 0419    05/06/15 0245  piperacillin-tazobactam (ZOSYN) IVPB 3.375 g     3.375 g 12.5 mL/hr over 240 Minutes Intravenous  Once 05/06/15 0247 05/06/15 2924       Assessment/Plan Abdominal pain, possible appendicitis vs pain from carcinomatosis -The patient's history and symptoms seem consistent with appendicitis; however, it is possible this is all related to his carcinomatosis as well. CT scan is unequivocal.  -Last CT done on 05/05/15, repeat CT Wednesday ordered -Conservative management with bowel rest and abx therapy (Zosyn Day #7)  -Tolerating minimal amounts of full liquids -No urgent surgical needs -IVF, pain control but limit narcotics, antiemetics -Encouraged ambulation and IS  Leukocytosis - WBC normal yesterday at 10.0 GIST tumors with carcinomatosis with recent intraperitoneal hemorrhage C.diff colitis -started on flagyl PO Day #3 -question this as cause of pain originally  Son at bedside. Dr. [name] did this patients surgery back in 06/04/2013 (Ex lap with SBR)     LOS: 6 days    DORT, Jinny Blossom 05/12/2015, 8:05 AM Pager: [phone]

## 2015-05-12 NOTE — Plan of Care (Signed)
Problem: Phase I Progression Outcomes Goal: Voiding-avoid urinary catheter unless indicated Outcome: Progressing In place for urinary retention

## 2015-05-12 NOTE — Progress Notes (Signed)
Physical Therapy Treatment Patient Details Name: [name] [name] MRN: [id number] DOB: 09-18-1929 Today's Date: 05/12/2015    History of Present Illness 81 yo male admitted with SIRS. hx of NSTEMI, HTN, abdominal carcinomatosis.     PT Comments    Assisted pt OIOB to amb around full unit.  Assisted to bathroom.  Staic standing at sink to clean dentures. Assisted back to bed per pt request.  Pt much fatigued after this session.   Follow Up Recommendations  Home health PT     Equipment Recommendations  None recommended by PT    Recommendations for Other Services       Precautions / Restrictions Precautions Precautions: Fall Restrictions Weight Bearing Restrictions: No    Mobility  Bed Mobility Overal bed mobility: Needs Assistance Bed Mobility: Supine to Sit;Sit to Supine     Supine to sit: Min assist;Min guard Sit to supine: Min assist;Min guard   General bed mobility comments: increased time and assist with B LE's back onto bed.  Transfers Overall transfer level: Needs assistance Equipment used: None Transfers: Sit to/from Stand Sit to Stand: Min assist         General transfer comment: 25% Vc's on proper hand placement to incearse self rise  Ambulation/Gait Ambulation/Gait assistance: Min guard Ambulation Distance (Feet): 475 Feet   Gait Pattern/deviations: Step-through pattern;Decreased stride length;Trunk flexed Gait velocity: decreased   General Gait Details: holding to IV pole.  Increased time.  slow but steady gait.    Stairs            Wheelchair Mobility    Modified Rankin (Stroke Patients Only)       Balance                                    Cognition Arousal/Alertness: Awake/alert Behavior During Therapy: WFL for tasks assessed/performed Overall Cognitive Status: Within Functional Limits for tasks assessed                      Exercises      General Comments        Pertinent Vitals/Pain Pain  Assessment: No/denies pain    Home Living                      Prior Function            PT Goals (current goals can now be found in the care plan section) Progress towards PT goals: Progressing toward goals    Frequency  Min 3X/week    PT Plan      Co-evaluation             End of Session Equipment Utilized During Treatment: Gait belt Activity Tolerance: Patient tolerated treatment well Patient left: in bed;with call bell/phone within reach     Time: 1000-1030 PT Time Calculation (min) (ACUTE ONLY): 30 min  Charges:  $Gait Training: 8-22 mins $Therapeutic Activity: 8-22 mins                    G Codes:      [name] [name]  PTA [facility]  Acute  Rehab Pager      [phone]

## 2015-05-12 NOTE — Care Management Note (Signed)
Case Management Note  Patient Details  Name: Giancarlo Askren MRN: [id number] Date of Birth: 26-May-1929  Subjective/Objective:                    Action/Plan:Pt admitted with SIRS, C-diff    Expected Discharge Date:                  Expected Discharge Plan:  [place]  In-House Referral:     Discharge planning Services  CM Consult  Post Acute Care Choice:    Choice offered to:  Spouse  DME Arranged:    DME Agency:  [place]:  PT [facility] Agency:  [place]  Status of Service:  In process, will continue to follow  Medicare Important Message Given:  Yes Date Medicare IM Given:  05/12/15 Medicare IM give by:  Purcell Mouton, RN Date Additional Medicare IM Given:    Additional Medicare Important Message give by:     If discussed at [place] of Stay Meetings, dates discussed:    Additional CommentsPurcell Mouton, RN 05/12/2015, 2:51 PM

## 2015-05-12 NOTE — Progress Notes (Signed)
Progress Note   [name] [name] QVZ:[id number] DOB: [birth year] DOA: 05/05/2015 PCP: Abigail Miyamoto, MD   Brief Narrative:   [name] [name] is an 81 y.o. male with h/o GIST tumor, peritoneal carcinomatosis, being treated with [place] who was admitted 05/05/15 with a chief complaint of abdominal pain associated with nausea. A CT scan, done on admission, showed possible findings consistent with appendicitis versus progressive abdominal carcinomatosis. The patient was placed on empiric Zosyn. He had significant abdominal distention and on 05/10/15 developed diarrhea with subsequent C. difficile testing positive. Flagyl was started at that time. General surgery continues to follow for possible appendicitis, which is being treated medically with antibiotics. Oncology also following.  Assessment/Plan:   Principal Problem:   Sepsis vs SIRS secondary to appendicitis versus abdominal carcinomatosis versus C. difficile colitis with associated abdominal pain - Status post vigorous hydration in the ED. Blood cultures negative to date. - IR consulted for paracentesis to rule out SBP.  Only trace ascites, so could not be done. - Initially treated with empiric zosyn for appendicitis although not entirely clear based on CT findings.   - Narrowed to Rocephin 05/11/15 given C diff +. - Repeat CT scan ordered for 05/13/15 by surgeon. - General surgery following with no current plans for surgical exploration. - Continue pain control efforts with Dilaudid.  Abdomen remains very distended. - KUB 05/10/15 showed ileus vs. Partial SBO.   Active Problems:   C. Diff diarrhea - The patient developed explosive diarrhea 05/10/15. C. difficile positive.  - Continue Flagyl.  Contact isolation.    Urinary retention, likely secondary to pain medication adverse effect versus tumor causing obstruction of urethra - Foley catheter placed 05/07/15. - Renal ultrasound negative for hydronephrosis.    Chronic anemia, multifactorial with  recent ABLA, anemia of chronic disease contributory - Hemoglobin drop likely dilutional.  Baseline hemoglobin 10.9.  Current hemoglobin stable at 9.3.    Acute kidney failure, pre-renal from dehydration in the setting of stage III CKD - Renal function improved with IVF.  Baseline creatinine 1.2.  Creatinine back to usual baseline values.    GIST (gastrointestinal stromal tumor) of small bowel, malignant - Dr. [name] following.  [place] resumed.    Hypertension - Continue metoprolol and Norvasc. Hydrochlorothiazide on hold.    Hyperlipidemia - Continue Zocor.    DVT Prophylaxis - SCDs ordered.  Code Status: Full. Family Communication: Son updated at the bedside. Disposition Plan: Home when stable and pain improved and showing signs of clinical improvement, likely several more days. Repeat CT scan ordered for 05/13/15.  IV Access:    Peripheral IV   Procedures and diagnostic studies:   Ct Abdomen Pelvis Wo Contrast  05/06/2015   CLINICAL DATA:  Mid right-sided abdominal pain, onset tonight. Nausea without vomiting. History of GI stromal tumor of small bowel.  EXAM: CT ABDOMEN AND PELVIS WITHOUT CONTRAST  TECHNIQUE: Multidetector CT imaging of the abdomen and pelvis was performed following the standard protocol without IV contrast.  COMPARISON:  CT 01/13/2015  FINDINGS: Resolution of previous left pleural effusion. Depending ground-glass opacity in the right lower lobe consistent with atelectasis. The heart is enlarged.  Lack of IV contrast limits assessment for focal hepatic lesion, allowing for this, no focal lesion is seen. Gallstones are seen within the gallbladder which is physiologically distended. Small amount perihepatic ascites, the degree of which is slightly increased from prior exam. Spleen is normal in size, development of small volume of perisplenic ascites.  Mesenteric and omental edema  and ill-defined soft tissue omental densities consistent with known peritoneal disease.  Soft tissue nodule in the midline upper abdomen measures 2.8 x 2.2 cm, previously 3.5 x 2.3 cm.  Small and large bowel are suboptimally assessed given lack contrast and haziness throughout the abdomen. The stomach through is distended with fluid, small amount of fluid in the distal esophagus is small hiatal hernia. Enteric anastomosis in the mid to left abdomen. Bowel loops in the right lower quadrant of the abdomen are suboptimally defined. There is hyperattenuation within a tubular structure in the right lower quadrant that may reflect the appendix, however neoplasm is not entirely excluded. There is increased soft tissue density/fluid and edema about this structure which may reflect increased ascites. Edema and fluid tracks in the right inguinal canal with possible peritoneal implant in the inguinal canal. There are multiple colonic diverticula containing retained barium from prior exams.  No adrenal nodule. Pancreas is normal. There is no hydronephrosis, kidneys are symmetric in size. Questionable soft tissue density in the region of the porta, may reflect periportal lymph nodes.  Dense atherosclerosis of the abdominal aorta without aneurysm. Small retroperitoneal soft tissue densities.  In the pelvis the bladder is physiologically distended. Prostate gland is normal in size. Small amount pelvic ascites.  No lytic or blastic osseous lesions. Degenerative change in the spine.  IMPRESSION: 1. Increased intra-abdominal ascites in the right and left upper quadrant and right lower quadrant, the degree of which remains mild. There is increased peritoneal and omental edema of the right lower quadrant. This edema and soft tissue density tracks into the right inguinal canal which may reflect progression of known metastatic disease, versus decompressed small bowel tracking in the right inguinal hernia. No proximal small bowel dilatation to suggest obstruction. 2. There is however interval improvement in size of dominant  omental nodule in the anterior upper abdomen from prior. 3. Tubular structure in the right lower quadrant containing high-density. This high-density was seen on prior exam, it is unclear whether this represents the appendix with retained barium versus related to neoplasm or metastatic disease. There is overall increase in the fluid and soft tissue in the right lower quadrant, in addition adjacent to these tubular structure, however the appearance does not favor that of appendicitis. 4. Cholelithiasis without findings of acute cholecystitis. Diverticulosis without diverticulitis.   Electronically Signed   By: Jeb Levering M.D.   On: 05/06/2015 00:17   Dg Abd 1 View  05/10/2015   CLINICAL DATA:  Abdominal distention. 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  EXAM: ABDOMEN - 1 VIEW  COMPARISON:  Abdomen and pelvis CT dated 05/05/2015.  FINDINGS: Mildly dilated small bowel loops in the left upper abdomen. Normal caliber gas filled right and transverse colon. Unremarkable bones.  IMPRESSION: Mild proximal small bowel ileus or partial obstruction.   Electronically Signed   By: Claudie Revering M.D.   On: 05/10/2015 11:47   US Renal  05/07/2015   CLINICAL DATA:  Urinary retention.  EXAM: RENAL / URINARY TRACT ULTRASOUND COMPLETE  COMPARISON:  CT scan abdomen dated 05/05/2015  FINDINGS: Right Kidney:  Length: 10.9 cm. Echogenicity within normal limits. No mass or hydronephrosis visualized.  Left Kidney:  Length: 11.5 cm. Echogenicity within normal limits. No mass or hydronephrosis visualized.  Bladder:  The bladder is empty with a Foley catheter in place.  IMPRESSION: Normal kidneys.  No hydronephrosis or other abnormality.   Electronically Signed   By: Lorriane Shire M.D.   On: 05/07/2015 17:27  US Abdomen Limited  05/06/2015   CLINICAL DATA:  Abdominal pain.  Evaluate for pocket of ascites.  EXAM: LIMITED ABDOMEN ULTRASOUND FOR ASCITES  TECHNIQUE: Limited ultrasound survey for ascites was performed in  all four abdominal quadrants.  COMPARISON:  CT abdomen 05/05/2015.  FINDINGS: A drainable pocket of ascites was not identified in any of the 4 quadrants.  IMPRESSION: As above.   Electronically Signed   By: Rolla Flatten M.D.   On: 05/06/2015 12:45     Medical Consultants:    Oncology  Surgery  Anti-Infectives:    Zosyn 05/05/15--->05/11/15  Rocephin 05/11/15--->  Flagyl 05/10/15--->  Subjective:   Beau Fanny continues to have some loose stools. No nausea or vomiting. Abdominal bloating persists. Pain has improved overall. Continues to ambulate with encouragement.  Objective:    Filed Vitals:   05/11/15 1048 05/11/15 1422 05/11/15 2105 05/12/15 0452  BP: 159/69 142/66 152/60 145/54  Pulse: 93 84 82 94  Temp:  98.7 F (37.1 C) 99.2 F (37.3 C) 98.1 F (36.7 C)  TempSrc:  Oral Oral Oral  Resp:  18 18 16   Height:      Weight:    76.4 kg (168 lb 6.9 oz)  SpO2: 98% 98% 97% 98%    Intake/Output Summary (Last 24 hours) at 05/12/15 0849 Last data filed at 05/12/15 0600  Gross per 24 hour  Intake   1830 ml  Output   1975 ml  Net   -145 ml    Exam: Gen:  NAD Cardiovascular:  Tachy, No M/R/G Respiratory:  Lungs CTAB Gastrointestinal:  Abdomen distended, tender RLQ, +BS Extremities:  1+ edema BLE, right hand swollen   Data Reviewed:    Labs: Basic Metabolic Panel:  Recent Labs Lab 05/05/15 2137 05/07/15 0539 05/08/15 0404 05/10/15 0537 05/11/15 0546  NA 139 139 140 143 141  K 3.1* 4.2 4.1 4.2 4.1  CL 100* 106 108 113* 110  CO2 27 25 24 23 24   GLUCOSE 188* 144* 144* 113* 114*  BUN 21* 32* 32* 28* 28*  CREATININE 1.34* 1.23 1.17 1.19 1.18  CALCIUM 8.9 7.7* 8.1* 8.6* 8.3*   GFR Estimated Creatinine Clearance: 43.6 mL/min (by C-G formula based on Cr of 1.18). Liver Function Tests:  Recent Labs Lab 05/05/15 2137  AST 26  ALT 27  ALKPHOS 84  BILITOT 1.0  PROT 7.1  ALBUMIN 4.1    Recent Labs Lab 05/05/15 2137  LIPASE 21*   Coagulation  profile  Recent Labs Lab 05/06/15 0304  INR 1.14    CBC:  Recent Labs Lab 05/05/15 2137 05/07/15 0539 05/08/15 0404 05/10/15 0537 05/11/15 0546  WBC 7.3 10.4 11.0* 9.9 10.0  NEUTROABS 5.6  --   --   --   --   HGB 12.0* 9.5* 9.4* 8.9* 9.3*  HCT 35.6* 28.5* 27.7* 27.0* 27.5*  MCV 104.1* 105.2* 104.1* 104.7* 103.8*  PLT 236 158 161 189 230   BNP (last 3 results)  Recent Labs  09/12/14 0445  PROBNP 5726.0*   Sepsis Labs:  Recent Labs Lab 05/06/15 0033 05/07/15 0539 05/08/15 0404 05/10/15 0537 05/11/15 0546  WBC  --  10.4 11.0* 9.9 10.0  LATICACIDVEN 1.74  --   --   --   --    Microbiology Recent Results (from the past 240 hour(s))  Urine culture     Status: None   Collection Time: 05/06/15  1:40 AM  Result Value Ref Range Status   Specimen Description URINE, CATHETERIZED  Final  Special Requests NONE  Final   Colony Count NO GROWTH Performed at [facility]   Final   Culture NO GROWTH Performed at [facility]   Final   Report Status 05/07/2015 FINAL  Final  Culture, blood (routine x 2)     Status: None   Collection Time: 05/06/15  3:04 AM  Result Value Ref Range Status   Specimen Description BLOOD RIGHT HAND  Final   Special Requests Immunocompromised  Final   Culture   Final    NO GROWTH 5 DAYS Performed at [facility]    Report Status 05/12/2015 FINAL  Final  Culture, blood (routine x 2)     Status: None   Collection Time: 05/06/15  3:04 AM  Result Value Ref Range Status   Specimen Description LEFT ANTECUBITAL  Final   Special Requests Immunocompromised  Final   Culture   Final    NO GROWTH 5 DAYS Performed at [facility]    Report Status 05/12/2015 FINAL  Final  Clostridium Difficile by PCR     Status: Abnormal   Collection Time: 05/10/15 11:00 AM  Result Value Ref Range Status   C difficile by pcr POSITIVE (A) NEGATIVE Final    Comment: CRITICAL RESULT CALLED TO, READ BACK BY AND VERIFIED WITH: I  HABIB AT 1340 ON 05.22.2016 BY NBROOKS      Medications:   . amLODipine  5 mg Oral Daily  . cefTRIAXone (ROCEPHIN)  IV  1 g Intravenous Q24H  . imatinib  400 mg Oral Q breakfast  . metoprolol tartrate  25 mg Oral BID  . metroNIDAZOLE  500 mg Oral 3 times per day  . saccharomyces boulardii  250 mg Oral BID  . simvastatin  20 mg Oral QPM  . sodium chloride  3 mL Intravenous Q12H   Continuous Infusions: . 0.9 % NaCl with KCl 40 mEq / L 50 mL/hr (05/10/15 2110)    Time spent: 25 minutes.   LOS: 6 days   [place] [facility] Pager [phone]. If unable to reach me by pager, please call my cell phone at [phone].  *Please refer to [email or web address], password [email or web address] to get updated schedule on who will round on this patient, as hospitalists switch teams weekly. If 7PM-7AM, please contact night-coverage at [email or web address], password [email or web address] for any overnight needs.  05/12/2015, 8:49 AM

## 2015-05-13 ENCOUNTER — Inpatient Hospital Stay (HOSPITAL_COMMUNITY): Payer: Medicare Other

## 2015-05-13 ENCOUNTER — Encounter (HOSPITAL_COMMUNITY): Payer: Self-pay | Admitting: Radiology

## 2015-05-13 DIAGNOSIS — IMO0002 Reserved for concepts with insufficient information to code with codable children: Secondary | ICD-10-CM | POA: Insufficient documentation

## 2015-05-13 DIAGNOSIS — K651 Peritoneal abscess: Secondary | ICD-10-CM

## 2015-05-13 MED ORDER — IOHEXOL 300 MG/ML  SOLN
100.0000 mL | Freq: Once | INTRAMUSCULAR | Status: AC | PRN
  Administered 2015-05-13: 100 mL via INTRAVENOUS

## 2015-05-13 MED ORDER — FENTANYL CITRATE (PF) 100 MCG/2ML IJ SOLN
INTRAMUSCULAR | Status: AC | PRN
  Administered 2015-05-13: 25 ug via INTRAVENOUS

## 2015-05-13 MED ORDER — IOHEXOL 300 MG/ML  SOLN
25.0000 mL | INTRAMUSCULAR | Status: AC
  Administered 2015-05-13 (×2): 25 mL via ORAL

## 2015-05-13 MED ORDER — FENTANYL CITRATE (PF) 100 MCG/2ML IJ SOLN
INTRAMUSCULAR | Status: AC
  Filled 2015-05-13: qty 2

## 2015-05-13 MED ORDER — MIDAZOLAM HCL 2 MG/2ML IJ SOLN
INTRAMUSCULAR | Status: AC
  Filled 2015-05-13: qty 4

## 2015-05-13 MED ORDER — MIDAZOLAM HCL 2 MG/2ML IJ SOLN
INTRAMUSCULAR | Status: AC | PRN
  Administered 2015-05-13: 1 mg via INTRAVENOUS

## 2015-05-13 NOTE — Consult Note (Signed)
Reason for consult: Pelvic abscess drainage Referring Physician(s): CCS  History of Present Illness: [name] [name] is a 81 y.o. 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 He is currently taking Gleevec.He was recently admitted with persistent abdominal pain, currently localized to the right lower quadrant, nausea, anorexia, low-grade fevers . He was noted to be C. difficile positive on 05/10/15 and currently on Rocephin and Flagyl. His most recent white blood cell count is normal, and he is currently afebrile. He continues to complain of persistent right lower quadrant abdominal pain .  Follow up  CT scan of the abdomen and pelvis today revealed  multiloculated cystic collection within the right lower quadrant worrisome for abscess and possibly from perforated appendix as well as loculated ascites and right inguinal  hernia containing small bowel loop.  Request has now been received from surgery for CT-guided drainage of the right lower quadrant loculated fluid collection.   Past Medical History  Diagnosis Date  . Hyperlipidemia     TAKES zOCOR DAILY  . Abdominal mass 02/2013  . UTI (urinary tract infection)   . Hypertension     TAKES LOTREL,HCTZ,AND METOPROLOL DAILY  . NSTEMI (non-ST elevated myocardial infarction) 02/2013    "LIGHT: ONE MD SAID HE DID AND ONE SAID HE DIDN'T  . Pneumonia     MAR 2014  . Stromal tumor of digestive system   . CAD (coronary artery disease)   . Shortness of breath   . Acute respiratory failure with hypoxia 09/12/2014  . Hemorrhagic shock 09/13/2014  . Intraperitoneal hemorrhage 09/17/2014  . CKD (chronic kidney disease), stage III     Past Surgical History  Procedure Laterality Date  . Cyst excision  1962    wrist  . Cardiac catheterization  03-05-13  . Laparotomy N/A 06/04/2013    Procedure:  EXPLORATORY LAPAROTOMY, OPEN RESECTION OF MESENTERIC AND INTESTINAL MASS;  Surgeon: [name], MD;  Location: [place];  Service: General;  Laterality: N/A;  Small Bowel Resection  . Colonoscopy  09/08/2014    dr scooler  . Left heart catheterization with coronary angiogram N/A 03/05/2013    Procedure: LEFT HEART CATHETERIZATION WITH CORONARY ANGIOGRAM;  Surgeon: [name], MD;  Location: [facility] CATH LAB;  Service: Cardiovascular;  Laterality: N/A;    Allergies: Review of patient's allergies indicates no known allergies.  Medications: Prior to Admission medications   Medication Sig Start Date End Date Taking? Authorizing Provider  amLODipine (NORVASC) 5 MG tablet Take 5 mg by mouth daily. 03/17/15  Yes Historical Provider, MD  hydrochlorothiazide (HYDRODIURIL) 25 MG tablet Take 25 mg by mouth daily.   Yes Historical Provider, MD  imatinib (GLEEVEC) 400 MG tablet Take 1 tablet (400 mg total) by mouth daily. Take with meals and large glass of water.Caution:Chemotherapy. 02/13/15  Yes Ladell Pier, MD  metoprolol tartrate (LOPRESSOR) 25 MG tablet Take 1 tablet (25 mg total) by mouth 2 (two) times daily. 03/06/13  Yes Costin Karlyne Greenspan, MD  simvastatin (ZOCOR) 20 MG tablet Take 20 mg by mouth every evening.   Yes Historical Provider, MD     Family History  Problem Relation Age of Onset  . Skin cancer Brother     History   Social History  . Marital Status: Married    Spouse Name: [name]  . Number of Children: 2  . Years of  Education: N/A   Occupational History  . Retired     Writer   Social History Main Topics  . Smoking status: Never Smoker   . Smokeless tobacco: Never Used  . Alcohol Use: No  . Drug Use: No  . Sexual Activity: No   Other Topics Concern  . None   Social History Narrative   Married, wife [name]   Retired Environmental manager for 58 years   Independent in [place]   #2 grown sons      Review of Systems see above  Vital Signs: BP  157/63 mmHg  Pulse 106  Temp(Src) 98.3 F (36.8 C) (Oral)  Resp 20  Ht 5\' 7"  (1.702 m)  Wt 169 lb 15.6 oz (77.1 kg)  BMI 26.62 kg/m2  SpO2 97%  Physical Exam Patient awake, alert. Chest with diminished breath sounds at the bases. Heart with regular rate and rhythm. Abdomen distended, few bowel sounds, moderately tender right lower quadrant. Extremities with no significant edema  Mallampati Score:     Imaging: Ct Abdomen Pelvis Wo Contrast  05/06/2015   CLINICAL DATA:  Mid right-sided abdominal pain, onset tonight. Nausea without vomiting. History of GI stromal tumor of small bowel.  EXAM: CT ABDOMEN AND PELVIS WITHOUT CONTRAST  TECHNIQUE: Multidetector CT imaging of the abdomen and pelvis was performed following the standard protocol without IV contrast.  COMPARISON:  CT 01/13/2015  FINDINGS: Resolution of previous left pleural effusion. Depending ground-glass opacity in the right lower lobe consistent with atelectasis. The heart is enlarged.  Lack of IV contrast limits assessment for focal hepatic lesion, allowing for this, no focal lesion is seen. Gallstones are seen within the gallbladder which is physiologically distended. Small amount perihepatic ascites, the degree of which is slightly increased from prior exam. Spleen is normal in size, development of small volume of perisplenic ascites.  Mesenteric and omental edema and ill-defined soft tissue omental densities consistent with known peritoneal disease. Soft tissue nodule in the midline upper abdomen measures 2.8 x 2.2 cm, previously 3.5 x 2.3 cm.  Small and large bowel are suboptimally assessed given lack contrast and haziness throughout the abdomen. The stomach through is distended with fluid, small amount of fluid in the distal esophagus is small hiatal hernia. Enteric anastomosis in the mid to left abdomen. Bowel loops in the right lower quadrant of the abdomen are suboptimally defined. There is hyperattenuation within a tubular  structure in the right lower quadrant that may reflect the appendix, however neoplasm is not entirely excluded. There is increased soft tissue density/fluid and edema about this structure which may reflect increased ascites. Edema and fluid tracks in the right inguinal canal with possible peritoneal implant in the inguinal canal. There are multiple colonic diverticula containing retained barium from prior exams.  No adrenal nodule. Pancreas is normal. There is no hydronephrosis, kidneys are symmetric in size. Questionable soft tissue density in the region of the porta, may reflect periportal lymph nodes.  Dense atherosclerosis of the abdominal aorta without aneurysm. Small retroperitoneal soft tissue densities.  In the pelvis the bladder is physiologically distended. Prostate gland is normal in size. Small amount pelvic ascites.  No lytic or blastic osseous lesions. Degenerative change in the spine.  IMPRESSION: 1. Increased intra-abdominal ascites in the right and left upper quadrant and right lower quadrant, the degree of which remains mild. There is increased peritoneal and omental edema of the right lower quadrant. This edema and soft tissue density tracks into the  right inguinal canal which may reflect progression of known metastatic disease, versus decompressed small bowel tracking in the right inguinal hernia. No proximal small bowel dilatation to suggest obstruction. 2. There is however interval improvement in size of dominant omental nodule in the anterior upper abdomen from prior. 3. Tubular structure in the right lower quadrant containing high-density. This high-density was seen on prior exam, it is unclear whether this represents the appendix with retained barium versus related to neoplasm or metastatic disease. There is overall increase in the fluid and soft tissue in the right lower quadrant, in addition adjacent to these tubular structure, however the appearance does not favor that of appendicitis. 4.  Cholelithiasis without findings of acute cholecystitis. Diverticulosis without diverticulitis.   Electronically Signed   By: Jeb Levering M.D.   On: 05/06/2015 00:17   Dg Abd 1 View  05/10/2015   CLINICAL DATA:  Abdominal distention. 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  EXAM: ABDOMEN - 1 VIEW  COMPARISON:  Abdomen and pelvis CT dated 05/05/2015.  FINDINGS: Mildly dilated small bowel loops in the left upper abdomen. Normal caliber gas filled right and transverse colon. Unremarkable bones.  IMPRESSION: Mild proximal small bowel ileus or partial obstruction.   Electronically Signed   By: Claudie Revering M.D.   On: 05/10/2015 11:47   Ct Abdomen Pelvis W Contrast  05/13/2015   CLINICAL DATA:  Right lower quadrant pain. History of gist tumor. Nausea, abdominal distention and diarrhea.  EXAM: CT ABDOMEN AND PELVIS WITH CONTRAST  TECHNIQUE: Multidetector CT imaging of the abdomen and pelvis was performed using the standard protocol following bolus administration of intravenous contrast.  CONTRAST:  135mL OMNIPAQUE IOHEXOL 300 MG/ML  SOLN  COMPARISON:  05/05/2015  FINDINGS: Lower chest: There are bilateral pleural effusions left greater night. New from previous exam. Overlying compressive type atelectasis/consolidation noted.  Hepatobiliary: No focal liver abnormality identified. The gallbladder is unremarkable. There is no biliary dilatation.  Pancreas: Negative  Spleen: Normal appearance of the spleen.  Adrenals/Urinary Tract: Normal adrenal glands. Normal appearance of the kidneys. Urinary bladder is collapsed chronic Foley catheter balloon.  Stomach/Bowel: Small hiatal hernia. The stomach is otherwise unremarkable. Mild increase caliber of the small bowel loops which measure up to 3.7 cm. There are areas of small bowel wall thickening noted, image number 43/series 2 Enteric contrast material is identified within the small bowel loops as well as the colon up to the level of the rectum. There is a  large right inguinal hernia which contains fluid and a nonobstructed loop of small bowel.  Vascular/Lymphatic: Calcified atherosclerotic disease involves the abdominal aorta. No aneurysm. No retroperitoneal adenopathy identified. There is no pelvic or inguinal adenopathy.  Reproductive: Prostate gland and seminal vesicles are unremarkable.  Other: Within the right lower quadrant of the abdomen in the area of the previously noted distended tubular structure there is a multi loculated cystic mass which measures 6.4 x 6.2 by 4.0 cm. Again noted is abdominal and pelvic ascites. On today's study this appears more loculated. For example, along the inferior surface of the right lobe of liver there is loculated fluid, image number 38/series 2.  Musculoskeletal: No aggressive lytic or sclerotic bone lesions.  IMPRESSION: 1. Multi loculated cystic mass is identified within the right lower quadrant of the abdomen and is new from previous exam. This is worrisome for abscess which may be the sequelae of a perforated appendix. 2. There is a persistent ascites within the abdomen and pelvis which now appears  more loculated and suggestive of peritonitis. 3. Increase caliber of small bowel loops worrisome for enteritis and ileus. Additionally, there is a right inguinal hernia which contains a loop of small bowel. This could be partially obstructed. There is no evidence for high-grade bowel obstruction however. 4. Critical Value/emergent results were called by telephone at the time of interpretation on 05/13/2015 at 1:10 pm to Dr. [name] , who verbally acknowledged these results.   Electronically Signed   By: Kerby Moors M.D.   On: 05/13/2015 13:13   US Renal  05/07/2015   CLINICAL DATA:  Urinary retention.  EXAM: RENAL / URINARY TRACT ULTRASOUND COMPLETE  COMPARISON:  CT scan abdomen dated 05/05/2015  FINDINGS: Right Kidney:  Length: 10.9 cm. Echogenicity within normal limits. No mass or hydronephrosis visualized.  Left Kidney:   Length: 11.5 cm. Echogenicity within normal limits. No mass or hydronephrosis visualized.  Bladder:  The bladder is empty with a Foley catheter in place.  IMPRESSION: Normal kidneys.  No hydronephrosis or other abnormality.   Electronically Signed   By: Lorriane Shire M.D.   On: 05/07/2015 17:27   US Abdomen Limited  05/06/2015   CLINICAL DATA:  Abdominal pain.  Evaluate for pocket of ascites.  EXAM: LIMITED ABDOMEN ULTRASOUND FOR ASCITES  TECHNIQUE: Limited ultrasound survey for ascites was performed in all four abdominal quadrants.  COMPARISON:  CT abdomen 05/05/2015.  FINDINGS: A drainable pocket of ascites was not identified in any of the 4 quadrants.  IMPRESSION: As above.   Electronically Signed   By: Rolla Flatten M.D.   On: 05/06/2015 12:45    Labs:  CBC:  Recent Labs  05/07/15 0539 05/08/15 0404 05/10/15 0537 05/11/15 0546  WBC 10.4 11.0* 9.9 10.0  HGB 9.5* 9.4* 8.9* 9.3*  HCT 28.5* 27.7* 27.0* 27.5*  PLT 158 161 189 230    COAGS:  Recent Labs  10/16/14 1626 10/21/14 0820 01/13/15 0100 05/06/15 0304  INR 1.09 1.07 1.05 1.14  APTT  --  29  --   --     BMP:  Recent Labs  05/07/15 0539 05/08/15 0404 05/10/15 0537 05/11/15 0546  NA 139 140 143 141  K 4.2 4.1 4.2 4.1  CL 106 108 113* 110  CO2 25 24 23 24   GLUCOSE 144* 144* 113* 114*  BUN 32* 32* 28* 28*  CALCIUM 7.7* 8.1* 8.6* 8.3*  CREATININE 1.23 1.17 1.19 1.18  GFRNONAA 52* 55* 54* 55*  GFRAA >60 >60 >60 >60    LIVER FUNCTION TESTS:  Recent Labs  02/18/15 1039 03/11/15 0907 04/09/15 0900 05/05/15 2137  BILITOT 0.98 0.90 0.55 1.0  AST 31 24 26 26   ALT 59* 34 34 27  ALKPHOS 153* 98 79 84  PROT 6.2* 6.5 6.3* 7.1  ALBUMIN 3.3* 3.5 3.6 4.1    TUMOR MARKERS: No results for input(s): AFPTM, CEA, CA199, CHROMGRNA in the last 8760 hours.  Assessment and Plan:  Patient with history of GIST 2014 with prior resection ; now with persistent right lower quadrant abdominal pain, recent C. difficile  colitis and findings on CT of multiloculated cystic fluid collection within the right lower quadrant concerning for abscess, question possible perforated appendix. [name] Request now received for CT guided drainage of the abscess. Imaging studies have been reviewed by Dr. Reesa Chew and he feels collection is amenable to drainage. Details/risks of procedure, including but not limited to, internal bleeding, infection, injury to adjacent organs, inability to adequately drain abscess, need for emergent surgery, discussed with  patient and son with their understanding and consent . Procedure is tentatively planned for later today.   Signed: D. Rowe Robert 05/13/2015, 2:27 PM   I spent a total of 40 minutes in face to face in clinical consultation, greater than 50% of which was counseling/coordinating care for CT guided drainage of right lower quadrant abscess

## 2015-05-13 NOTE — Progress Notes (Signed)
Progress Note   [name] [name] MWU:[id number] DOB: Dec 17, 1929 DOA: 05/05/2015 PCP: Abigail Miyamoto, MD   Brief Narrative:   [name] [name] is an 81 y.o. male with h/o GIST tumor, peritoneal carcinomatosis, being treated with Vansant who was admitted 05/05/15 with a chief complaint of abdominal pain associated with nausea. A CT scan, done on admission, showed possible findings consistent with appendicitis versus progressive abdominal carcinomatosis. The patient was placed on empiric Zosyn. He had significant abdominal distention and on 05/10/15 developed diarrhea with subsequent C. difficile testing positive. Flagyl was started at that time. General surgery continues to follow for possible appendicitis, which is being treated medically with antibiotics. Oncology also following.  Assessment/Plan:   Principal Problem:   Sepsis vs SIRS secondary to appendicitis versus abdominal carcinomatosis versus C. difficile colitis with associated abdominal pain - Status post vigorous hydration in the ED. Blood cultures negative to date. - IR consulted for paracentesis to rule out SBP.  Only trace ascites, so could not be done. - Initially treated with empiric zosyn for appendicitis although not entirely clear based on CT findings.   - Narrowed to Rocephin 05/11/15 given C diff +. - Repeat CT scan ordered for 05/13/15 by surgeon showed loculated fluid collection, underwent IR drain placement and bloody pus drained out.  - Continue pain control efforts with Dilaudid.  Abdomen remains very distended.  Active Problems:   C. Diff diarrhea - The patient developed explosive diarrhea 05/10/15. C. difficile positive.  - Continue Flagyl.  Contact isolation.    Urinary retention, likely secondary to pain medication adverse effect versus tumor causing obstruction of urethra - Foley catheter placed 05/07/15. - Renal ultrasound negative for hydronephrosis.    Chronic anemia, multifactorial with recent ABLA, anemia of chronic  disease contributory - Hemoglobin drop likely dilutional.  Baseline hemoglobin 10.9.  Current hemoglobin stable at 9.3.    Acute kidney failure, pre-renal from dehydration in the setting of stage III CKD - Renal function improved with IVF.  Baseline creatinine 1.2.  Creatinine back to usual baseline values.    GIST (gastrointestinal stromal tumor) of small bowel, malignant - Dr. [name] following.  Gleevec is being held for ? appendix perforation vs abscess    Hypertension - Continue metoprolol and Norvasc. Hydrochlorothiazide on hold.    Hyperlipidemia - Continue Zocor.    DVT Prophylaxis - SCDs ordered.  Code Status: Full. Family Communication: none at bedside Disposition Plan: pending further management.   IV Access:    Peripheral IV   Procedures and diagnostic studies:   Ct Abdomen Pelvis Wo Contrast  05/06/2015   CLINICAL DATA:  Mid right-sided abdominal pain, onset tonight. Nausea without vomiting. History of GI stromal tumor of small bowel.  EXAM: CT ABDOMEN AND PELVIS WITHOUT CONTRAST  TECHNIQUE: Multidetector CT imaging of the abdomen and pelvis was performed following the standard protocol without IV contrast.  COMPARISON:  CT 01/13/2015  FINDINGS: Resolution of previous left pleural effusion. Depending ground-glass opacity in the right lower lobe consistent with atelectasis. The heart is enlarged.  Lack of IV contrast limits assessment for focal hepatic lesion, allowing for this, no focal lesion is seen. Gallstones are seen within the gallbladder which is physiologically distended. Small amount perihepatic ascites, the degree of which is slightly increased from prior exam. Spleen is normal in size, development of small volume of perisplenic ascites.  Mesenteric and omental edema and ill-defined soft tissue omental densities consistent with known peritoneal disease. Soft tissue nodule in the midline upper  abdomen measures 2.8 x 2.2 cm, previously 3.5 x 2.3 cm.  Small and  large bowel are suboptimally assessed given lack contrast and haziness throughout the abdomen. The stomach through is distended with fluid, small amount of fluid in the distal esophagus is small hiatal hernia. Enteric anastomosis in the mid to left abdomen. Bowel loops in the right lower quadrant of the abdomen are suboptimally defined. There is hyperattenuation within a tubular structure in the right lower quadrant that may reflect the appendix, however neoplasm is not entirely excluded. There is increased soft tissue density/fluid and edema about this structure which may reflect increased ascites. Edema and fluid tracks in the right inguinal canal with possible peritoneal implant in the inguinal canal. There are multiple colonic diverticula containing retained barium from prior exams.  No adrenal nodule. Pancreas is normal. There is no hydronephrosis, kidneys are symmetric in size. Questionable soft tissue density in the region of the porta, may reflect periportal lymph nodes.  Dense atherosclerosis of the abdominal aorta without aneurysm. Small retroperitoneal soft tissue densities.  In the pelvis the bladder is physiologically distended. Prostate gland is normal in size. Small amount pelvic ascites.  No lytic or blastic osseous lesions. Degenerative change in the spine.  IMPRESSION: 1. Increased intra-abdominal ascites in the right and left upper quadrant and right lower quadrant, the degree of which remains mild. There is increased peritoneal and omental edema of the right lower quadrant. This edema and soft tissue density tracks into the right inguinal canal which may reflect progression of known metastatic disease, versus decompressed small bowel tracking in the right inguinal hernia. No proximal small bowel dilatation to suggest obstruction. 2. There is however interval improvement in size of dominant omental nodule in the anterior upper abdomen from prior. 3. Tubular structure in the right lower quadrant  containing high-density. This high-density was seen on prior exam, it is unclear whether this represents the appendix with retained barium versus related to neoplasm or metastatic disease. There is overall increase in the fluid and soft tissue in the right lower quadrant, in addition adjacent to these tubular structure, however the appearance does not favor that of appendicitis. 4. Cholelithiasis without findings of acute cholecystitis. Diverticulosis without diverticulitis.   Electronically Signed   By: Jeb Levering M.D.   On: 05/06/2015 00:17   Dg Abd 1 View  05/10/2015   CLINICAL DATA:  Abdominal distention. 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  EXAM: ABDOMEN - 1 VIEW  COMPARISON:  Abdomen and pelvis CT dated 05/05/2015.  FINDINGS: Mildly dilated small bowel loops in the left upper abdomen. Normal caliber gas filled right and transverse colon. Unremarkable bones.  IMPRESSION: Mild proximal small bowel ileus or partial obstruction.   Electronically Signed   By: Claudie Revering M.D.   On: 05/10/2015 11:47   Ct Abdomen Pelvis W Contrast  05/13/2015   CLINICAL DATA:  Right lower quadrant pain. History of gist tumor. Nausea, abdominal distention and diarrhea.  EXAM: CT ABDOMEN AND PELVIS WITH CONTRAST  TECHNIQUE: Multidetector CT imaging of the abdomen and pelvis was performed using the standard protocol following bolus administration of intravenous contrast.  CONTRAST:  143mL OMNIPAQUE IOHEXOL 300 MG/ML  SOLN  COMPARISON:  05/05/2015  FINDINGS: Lower chest: There are bilateral pleural effusions left greater night. New from previous exam. Overlying compressive type atelectasis/consolidation noted.  Hepatobiliary: No focal liver abnormality identified. The gallbladder is unremarkable. There is no biliary dilatation.  Pancreas: Negative  Spleen: Normal appearance of the spleen.  Adrenals/Urinary Tract: Normal adrenal glands. Normal appearance of the kidneys. Urinary bladder is collapsed chronic  Foley catheter balloon.  Stomach/Bowel: Small hiatal hernia. The stomach is otherwise unremarkable. Mild increase caliber of the small bowel loops which measure up to 3.7 cm. There are areas of small bowel wall thickening noted, image number 43/series 2 Enteric contrast material is identified within the small bowel loops as well as the colon up to the level of the rectum. There is a large right inguinal hernia which contains fluid and a nonobstructed loop of small bowel.  Vascular/Lymphatic: Calcified atherosclerotic disease involves the abdominal aorta. No aneurysm. No retroperitoneal adenopathy identified. There is no pelvic or inguinal adenopathy.  Reproductive: Prostate gland and seminal vesicles are unremarkable.  Other: Within the right lower quadrant of the abdomen in the area of the previously noted distended tubular structure there is a multi loculated cystic mass which measures 6.4 x 6.2 by 4.0 cm. Again noted is abdominal and pelvic ascites. On today's study this appears more loculated. For example, along the inferior surface of the right lobe of liver there is loculated fluid, image number 38/series 2.  Musculoskeletal: No aggressive lytic or sclerotic bone lesions.  IMPRESSION: 1. Multi loculated cystic mass is identified within the right lower quadrant of the abdomen and is new from previous exam. This is worrisome for abscess which may be the sequelae of a perforated appendix. 2. There is a persistent ascites within the abdomen and pelvis which now appears more loculated and suggestive of peritonitis. 3. Increase caliber of small bowel loops worrisome for enteritis and ileus. Additionally, there is a right inguinal hernia which contains a loop of small bowel. This could be partially obstructed. There is no evidence for high-grade bowel obstruction however. 4. Critical Value/emergent results were called by telephone at the time of interpretation on 05/13/2015 at 1:10 pm to Dr. [name] , who verbally  acknowledged these results.   Electronically Signed   By: Kerby Moors M.D.   On: 05/13/2015 13:13   US Renal  05/07/2015   CLINICAL DATA:  Urinary retention.  EXAM: RENAL / URINARY TRACT ULTRASOUND COMPLETE  COMPARISON:  CT scan abdomen dated 05/05/2015  FINDINGS: Right Kidney:  Length: 10.9 cm. Echogenicity within normal limits. No mass or hydronephrosis visualized.  Left Kidney:  Length: 11.5 cm. Echogenicity within normal limits. No mass or hydronephrosis visualized.  Bladder:  The bladder is empty with a Foley catheter in place.  IMPRESSION: Normal kidneys.  No hydronephrosis or other abnormality.   Electronically Signed   By: Lorriane Shire M.D.   On: 05/07/2015 17:27   US Abdomen Limited  05/06/2015   CLINICAL DATA:  Abdominal pain.  Evaluate for pocket of ascites.  EXAM: LIMITED ABDOMEN ULTRASOUND FOR ASCITES  TECHNIQUE: Limited ultrasound survey for ascites was performed in all four abdominal quadrants.  COMPARISON:  CT abdomen 05/05/2015.  FINDINGS: A drainable pocket of ascites was not identified in any of the 4 quadrants.  IMPRESSION: As above.   Electronically Signed   By: Rolla Flatten M.D.   On: 05/06/2015 12:45   Ct Image Guided Drainage By Percutaneous Catheter  05/13/2015   CLINICAL DATA:  Right lower quadrant perforated appendicitis with abscess  EXAM: CT GUIDED DRAINAGE OF RIGHT LOWER QUADRANT APPENDICEAL ABSCESS  ANESTHESIA/SEDATION: 1.0 Mg IV Versed 25 mcg IV Fentanyl  Total Moderate Sedation Time:  15 minutes  PROCEDURE: The procedure, risks, benefits, and alternatives were explained to the patient. Questions regarding the procedure were encouraged  and answered. The patient understands and consents to the procedure.  The right lower quadrant was prepped with Betadinein a sterile fashion, and a sterile drape was applied covering the operative field. A sterile gown and sterile gloves were used for the procedure. Local anesthesia was provided with 1% Lidocaine.  Previous imaging  reviewed. Patient positioned supine. Noncontrast localization CT performed through the lower abdomen. The right lower quadrant loculated abscess was localized. Under sterile conditions and local anesthesia, an 18 gauge 10 cm access needle was advanced from a lateral approach into the collection. Syringe aspiration yielded purulent fluid compatible with abscess. Amplatz guidewire inserted followed by tract dilatation to advance a 10 [place] drain. Retention loop formed in the abscess cavity and confirmed with CT. Syringe aspiration yielded 23 cc bloody purulent fluid. Sample sent for Gram stain and culture. Catheter secured with a Prolene suture and connected to external suction bulb. Sterile dressing applied.  COMPLICATIONS: None immediate  FINDINGS: Imaging confirms needle placement in the right lower quadrant loculated abscess for drainage  IMPRESSION: Successful CT-guided right lower quadrant appendiceal abscess drain insertion.   Electronically Signed   By: Jerilynn Mages.  Shick M.D.   On: 05/13/2015 16:23     Medical Consultants:    Oncology  Surgery  Anti-Infectives:    Zosyn 05/05/15--->05/11/15  Rocephin 05/11/15--->  Flagyl 05/10/15--->  Subjective:   [name] [name]  No nausea or vomiting. Abdominal bloating persists.   Objective:    Filed Vitals:   05/13/15 1610 05/13/15 1616 05/13/15 1636 05/13/15 1705  BP: 153/63 146/60 141/56 140/65  Pulse: 93 90 85 88  Temp:   98.5 F (36.9 C) 98.6 F (37 C)  TempSrc:   Oral Oral  Resp: 23 20 20 20   Height:      Weight:      SpO2: 98% 98% 96% 95%    Intake/Output Summary (Last 24 hours) at 05/13/15 1734 Last data filed at 05/13/15 1225  Gross per 24 hour  Intake 2031.25 ml  Output    400 ml  Net 1631.25 ml    Exam: Gen:  NAD Cardiovascular:  Tachy, No M/R/G Respiratory:  Lungs CTAB Gastrointestinal:  Abdomen distended, tender RLQ, +BS Extremities:  1+ edema BLE, right hand swollen   Data Reviewed:    Labs: Basic Metabolic  Panel:  Recent Labs Lab 05/07/15 0539 05/08/15 0404 05/10/15 0537 05/11/15 0546  NA 139 140 143 141  K 4.2 4.1 4.2 4.1  CL 106 108 113* 110  CO2 25 24 23 24   GLUCOSE 144* 144* 113* 114*  BUN 32* 32* 28* 28*  CREATININE 1.23 1.17 1.19 1.18  CALCIUM 7.7* 8.1* 8.6* 8.3*   GFR Estimated Creatinine Clearance: 43.6 mL/min (by C-G formula based on Cr of 1.18). Liver Function Tests: No results for input(s): AST, ALT, ALKPHOS, BILITOT, PROT, ALBUMIN in the last 168 hours. No results for input(s): LIPASE, AMYLASE in the last 168 hours. Coagulation profile No results for input(s): INR, PROTIME in the last 168 hours.  CBC:  Recent Labs Lab 05/07/15 0539 05/08/15 0404 05/10/15 0537 05/11/15 0546  WBC 10.4 11.0* 9.9 10.0  HGB 9.5* 9.4* 8.9* 9.3*  HCT 28.5* 27.7* 27.0* 27.5*  MCV 105.2* 104.1* 104.7* 103.8*  PLT 158 161 189 230   BNP (last 3 results)  Recent Labs  09/12/14 0445  PROBNP 5726.0*   Sepsis Labs:  Recent Labs Lab 05/07/15 0539 05/08/15 0404 05/10/15 0537 05/11/15 0546  WBC 10.4 11.0* 9.9 10.0   Microbiology Recent Results (from  the past 240 hour(s))  Urine culture     Status: None   Collection Time: 05/06/15  1:40 AM  Result Value Ref Range Status   Specimen Description URINE, CATHETERIZED  Final   Special Requests NONE  Final   Colony Count NO GROWTH Performed at [facility]   Final   Culture NO GROWTH Performed at [facility]   Final   Report Status 05/07/2015 FINAL  Final  Culture, blood (routine x 2)     Status: None   Collection Time: 05/06/15  3:04 AM  Result Value Ref Range Status   Specimen Description BLOOD RIGHT HAND  Final   Special Requests Immunocompromised  Final   Culture   Final    NO GROWTH 5 DAYS Performed at [facility]    Report Status 05/12/2015 FINAL  Final  Culture, blood (routine x 2)     Status: None   Collection Time: 05/06/15  3:04 AM  Result Value Ref Range Status   Specimen  Description LEFT ANTECUBITAL  Final   Special Requests Immunocompromised  Final   Culture   Final    NO GROWTH 5 DAYS Performed at [facility]    Report Status 05/12/2015 FINAL  Final  Clostridium Difficile by PCR     Status: Abnormal   Collection Time: 05/10/15 11:00 AM  Result Value Ref Range Status   C difficile by pcr POSITIVE (A) NEGATIVE Final    Comment: CRITICAL RESULT CALLED TO, READ BACK BY AND VERIFIED WITH: I HABIB AT 1340 ON 05.22.2016 BY NBROOKS      Medications:   . amLODipine  5 mg Oral Daily  . cefTRIAXone (ROCEPHIN)  IV  1 g Intravenous Q24H  . metoprolol tartrate  25 mg Oral BID  . metroNIDAZOLE  500 mg Oral 3 times per day  . saccharomyces boulardii  250 mg Oral BID  . simvastatin  20 mg Oral QPM  . sodium chloride  3 mL Intravenous Q12H   Continuous Infusions: . 0.9 % NaCl with KCl 40 mEq / L 75 mL/hr (05/13/15 1650)    Time spent: 25 minutes.   LOS: 7 days   [facility] Pager [phone]   If 7PM-7AM, please contact night-coverage at [email or web address], password [email or web address] for any overnight needs.  05/13/2015, 5:34 PM

## 2015-05-13 NOTE — Progress Notes (Signed)
  Subjective: He remains very tender RLQ, quite distended.  Stool is getting form and last one was last PM.  Pain in RLQ is not better.  Objective: Vital signs in last 24 hours: Temp:  [97.7 F (36.5 C)-98.7 F (37.1 C)] 97.7 F (36.5 C) (05/25 0520) Pulse Rate:  [87-90] 87 (05/25 0520) Resp:  [16-19] 19 (05/25 0520) BP: (135-146)/(50-56) 135/50 mmHg (05/25 0520) SpO2:  [95 %-99 %] 99 % (05/25 0520) Weight:  [77.1 kg (169 lb 15.6 oz)] 77.1 kg (169 lb 15.6 oz) (05/25 0500) Last BM Date: 05/12/15 1200 Po recorded Soft diet Stools x 2 Afebrile, VSS Labs:  stable Last film; 05/10/15:  Mild ileus vs obstruction For repeat CT today Intake/Output from previous day: 05/24 0701 - 05/25 0700 In: 3613.8 [P.O.:1200; I.V.:1613.8; IV Piggyback:50] Out: 2 [Stool:2] Intake/Output this shift:    General appearance: alert, cooperative and no distress GI: distended, tympanic, tender RLQ, BS hyperactive.  he is full of contrast waiting for CT scan.  Lab Results:   Recent Labs  05/11/15 0546  WBC 10.0  HGB 9.3*  HCT 27.5*  PLT 230    BMET  Recent Labs  05/11/15 0546  NA 141  K 4.1  CL 110  CO2 24  GLUCOSE 114*  BUN 28*  CREATININE 1.18  CALCIUM 8.3*   PT/INR No results for input(s): LABPROT, INR in the last 72 hours.  No results for input(s): AST, ALT, ALKPHOS, BILITOT, PROT, ALBUMIN in the last 168 hours.   Lipase     Component Value Date/Time   LIPASE 21* 05/05/2015 2137     Studies/Results: No results found.  Medications: . amLODipine  5 mg Oral Daily  . cefTRIAXone (ROCEPHIN)  IV  1 g Intravenous Q24H  . imatinib  400 mg Oral Q breakfast  . metoprolol tartrate  25 mg Oral BID  . metroNIDAZOLE  500 mg Oral 3 times per day  . saccharomyces boulardii  250 mg Oral BID  . simvastatin  20 mg Oral QPM  . sodium chloride  3 mL Intravenous Q12H    Assessment/Plan Abdominal pain RLQ, possible appendicitis vs carcinomatosis Hx of GIST tumor with carcinomatosis   EXPLORATORY LAPAROTOMY, OPEN RESECTION OF MESENTERIC AND INTESTINAL MASS; Surgeon: [name], MD, 06/01/13. C diff colitis - flagyl, and rocephin Hx of NSTEMI/CAD Hypertension Hx of Intraperitoneal heomrrhage, Acute respiratory failure, shock, 08/2014 Hx of chronic renal disease, stage III Anemia Dyslipidemia Antibiotics:6 days of Zosyn, 3 days of Flagyl, Rocephin 2 days completed Day 4 Flagyl, and day 3 Rocephin DVT: No chemical prophylaxis/SCD's ordered   Plan:  No improvement in pain, diarrhea better.  WBC is normal, tolerating soft diet, CT scan is pending.    LOS: 7 days    Danta Baumgardner 05/13/2015

## 2015-05-13 NOTE — Progress Notes (Signed)
PT Cancellation Note  Patient Details Name: [name] [name] MRN: [id number] DOB: July 24, 1929   Cancelled Treatment:     CT ABD with contrast am                                             C/R resting pm   Nathanial Rancher 05/13/2015, 3:02 PM

## 2015-05-13 NOTE — Procedures (Signed)
Successful RLQ abscess drian insertion No comp Stable 23 cc bloody pus aspirated Gs/cx sent Full report in pacs

## 2015-05-14 DIAGNOSIS — D649 Anemia, unspecified: Secondary | ICD-10-CM

## 2015-05-14 DIAGNOSIS — A419 Sepsis, unspecified organism: Secondary | ICD-10-CM

## 2015-05-14 LAB — CBC
HCT: 26.1 % — ABNORMAL LOW (ref 39.0–52.0)
HEMOGLOBIN: 8.7 g/dL — AB (ref 13.0–17.0)
MCH: 34.8 pg — ABNORMAL HIGH (ref 26.0–34.0)
MCHC: 33.3 g/dL (ref 30.0–36.0)
MCV: 104.4 fL — AB (ref 78.0–100.0)
PLATELETS: 379 10*3/uL (ref 150–400)
RBC: 2.5 MIL/uL — AB (ref 4.22–5.81)
RDW: 14.3 % (ref 11.5–15.5)
WBC: 12.6 10*3/uL — ABNORMAL HIGH (ref 4.0–10.5)

## 2015-05-14 LAB — COMPREHENSIVE METABOLIC PANEL
ALK PHOS: 69 U/L (ref 38–126)
ALT: 23 U/L (ref 17–63)
ANION GAP: 6 (ref 5–15)
AST: 20 U/L (ref 15–41)
Albumin: 2.1 g/dL — ABNORMAL LOW (ref 3.5–5.0)
BUN: 22 mg/dL — AB (ref 6–20)
CALCIUM: 7.6 mg/dL — AB (ref 8.9–10.3)
CO2: 23 mmol/L (ref 22–32)
Chloride: 111 mmol/L (ref 101–111)
Creatinine, Ser: 1.08 mg/dL (ref 0.61–1.24)
GFR calc Af Amer: >60 mL/min (ref >60)
GFR calc non Af Amer: >60 mL/min (ref >60)
GLUCOSE: 99 mg/dL (ref 65–99)
Potassium: 4.4 mmol/L (ref 3.5–5.1)
Sodium: 140 mmol/L (ref 135–145)
TOTAL PROTEIN: 5.2 g/dL — AB (ref 6.5–8.1)
Total Bilirubin: 0.9 mg/dL (ref 0.3–1.2)

## 2015-05-14 MED ORDER — IMATINIB MESYLATE 100 MG PO TABS
400.0000 mg | ORAL_TABLET | Freq: Every day | ORAL | Status: DC
  Administered 2015-05-14 – 2015-05-20 (×7): 400 mg via ORAL
  Filled 2015-05-14 (×8): qty 4

## 2015-05-14 MED ORDER — IMATINIB MESYLATE 100 MG PO TABS: 400.0000 mg | ORAL_TABLET | Freq: Every day | ORAL | Status: DC

## 2015-05-14 NOTE — Progress Notes (Signed)
Referring Physician(s): CCS  Subjective: Patient states his abdominal pain has improved since yesterday.   Allergies: Review of patient's allergies indicates no known allergies.  Medications: Prior to Admission medications   Medication Sig Start Date End Date Taking? Authorizing Provider  amLODipine (NORVASC) 5 MG tablet Take 5 mg by mouth daily. 03/17/15  Yes Historical Provider, MD  hydrochlorothiazide (HYDRODIURIL) 25 MG tablet Take 25 mg by mouth daily.   Yes Historical Provider, MD  imatinib (GLEEVEC) 400 MG tablet Take 1 tablet (400 mg total) by mouth daily. Take with meals and large glass of water.Caution:Chemotherapy. 02/13/15  Yes Ladell Pier, MD  metoprolol tartrate (LOPRESSOR) 25 MG tablet Take 1 tablet (25 mg total) by mouth 2 (two) times daily. 03/06/13  Yes Costin Karlyne Greenspan, MD  simvastatin (ZOCOR) 20 MG tablet Take 20 mg by mouth every evening.   Yes Historical Provider, MD    Vital Signs: BP 148/58 mmHg  Pulse 104  Temp(Src) 98.7 F (37.1 C) (Oral)  Resp 20  Ht 5\' 7"  (1.702 m)  Wt 172 lb 2.9 oz (78.1 kg)  BMI 26.96 kg/m2  SpO2 97%  Physical Exam General: A&Ox3, NAD Abd: Distended, RLQ drain intact bloody purulent output, Cx gram (+) cocci in pairs/pending, 24 hr output 620 ml   Imaging: Ct Abdomen Pelvis W Contrast  05/13/2015   CLINICAL DATA:  Right lower quadrant pain. History of gist tumor. Nausea, abdominal distention and diarrhea.  EXAM: CT ABDOMEN AND PELVIS WITH CONTRAST  TECHNIQUE: Multidetector CT imaging of the abdomen and pelvis was performed using the standard protocol following bolus administration of intravenous contrast.  CONTRAST:  173mL OMNIPAQUE IOHEXOL 300 MG/ML  SOLN  COMPARISON:  05/05/2015  FINDINGS: Lower chest: There are bilateral pleural effusions left greater night. New from previous exam. Overlying compressive type atelectasis/consolidation noted.  Hepatobiliary: No focal liver abnormality identified. The gallbladder is unremarkable.  There is no biliary dilatation.  Pancreas: Negative  Spleen: Normal appearance of the spleen.  Adrenals/Urinary Tract: Normal adrenal glands. Normal appearance of the kidneys. Urinary bladder is collapsed chronic Foley catheter balloon.  Stomach/Bowel: Small hiatal hernia. The stomach is otherwise unremarkable. Mild increase caliber of the small bowel loops which measure up to 3.7 cm. There are areas of small bowel wall thickening noted, image number 43/series 2 Enteric contrast material is identified within the small bowel loops as well as the colon up to the level of the rectum. There is a large right inguinal hernia which contains fluid and a nonobstructed loop of small bowel.  Vascular/Lymphatic: Calcified atherosclerotic disease involves the abdominal aorta. No aneurysm. No retroperitoneal adenopathy identified. There is no pelvic or inguinal adenopathy.  Reproductive: Prostate gland and seminal vesicles are unremarkable.  Other: Within the right lower quadrant of the abdomen in the area of the previously noted distended tubular structure there is a multi loculated cystic mass which measures 6.4 x 6.2 by 4.0 cm. Again noted is abdominal and pelvic ascites. On today's study this appears more loculated. For example, along the inferior surface of the right lobe of liver there is loculated fluid, image number 38/series 2.  Musculoskeletal: No aggressive lytic or sclerotic bone lesions.  IMPRESSION: 1. Multi loculated cystic mass is identified within the right lower quadrant of the abdomen and is new from previous exam. This is worrisome for abscess which may be the sequelae of a perforated appendix. 2. There is a persistent ascites within the abdomen and pelvis which now appears more loculated and suggestive  of peritonitis. 3. Increase caliber of small bowel loops worrisome for enteritis and ileus. Additionally, there is a right inguinal hernia which contains a loop of small bowel. This could be partially  obstructed. There is no evidence for high-grade bowel obstruction however. 4. Critical Value/emergent results were called by telephone at the time of interpretation on 05/13/2015 at 1:10 pm to Dr. [name] , who verbally acknowledged these results.   Electronically Signed   By: Kerby Moors M.D.   On: 05/13/2015 13:13   Ct Image Guided Drainage By Percutaneous Catheter  05/13/2015   CLINICAL DATA:  Right lower quadrant perforated appendicitis with abscess  EXAM: CT GUIDED DRAINAGE OF RIGHT LOWER QUADRANT APPENDICEAL ABSCESS  ANESTHESIA/SEDATION: 1.0 Mg IV Versed 25 mcg IV Fentanyl  Total Moderate Sedation Time:  15 minutes  PROCEDURE: The procedure, risks, benefits, and alternatives were explained to the patient. Questions regarding the procedure were encouraged and answered. The patient understands and consents to the procedure.  The right lower quadrant was prepped with Betadinein a sterile fashion, and a sterile drape was applied covering the operative field. A sterile gown and sterile gloves were used for the procedure. Local anesthesia was provided with 1% Lidocaine.  Previous imaging reviewed. Patient positioned supine. Noncontrast localization CT performed through the lower abdomen. The right lower quadrant loculated abscess was localized. Under sterile conditions and local anesthesia, an 18 gauge 10 cm access needle was advanced from a lateral approach into the collection. Syringe aspiration yielded purulent fluid compatible with abscess. Amplatz guidewire inserted followed by tract dilatation to advance a 10 [place] drain. Retention loop formed in the abscess cavity and confirmed with CT. Syringe aspiration yielded 23 cc bloody purulent fluid. Sample sent for Gram stain and culture. Catheter secured with a Prolene suture and connected to external suction bulb. Sterile dressing applied.  COMPLICATIONS: None immediate  FINDINGS: Imaging confirms needle placement in the right lower quadrant loculated abscess  for drainage  IMPRESSION: Successful CT-guided right lower quadrant appendiceal abscess drain insertion.   Electronically Signed   By: Jerilynn Mages.  Shick M.D.   On: 05/13/2015 16:23    Labs:  CBC:  Recent Labs  05/08/15 0404 05/10/15 0537 05/11/15 0546 05/14/15 0430  WBC 11.0* 9.9 10.0 12.6*  HGB 9.4* 8.9* 9.3* 8.7*  HCT 27.7* 27.0* 27.5* 26.1*  PLT 161 189 230 379    COAGS:  Recent Labs  10/16/14 1626 10/21/14 0820 01/13/15 0100 05/06/15 0304  INR 1.09 1.07 1.05 1.14  APTT  --  29  --   --     BMP:  Recent Labs  05/08/15 0404 05/10/15 0537 05/11/15 0546 05/14/15 0430  NA 140 143 141 140  K 4.1 4.2 4.1 4.4  CL 108 113* 110 111  CO2 24 23 24 23   GLUCOSE 144* 113* 114* 99  BUN 32* 28* 28* 22*  CALCIUM 8.1* 8.6* 8.3* 7.6*  CREATININE 1.17 1.19 1.18 1.08  GFRNONAA 55* 54* 55* >60  GFRAA >60 >60 >60 >60    LIVER FUNCTION TESTS:  Recent Labs  03/11/15 0907 04/09/15 0900 05/05/15 2137 05/14/15 0430  BILITOT 0.90 0.55 1.0 0.9  AST 24 26 26 20   ALT 34 34 27 23  ALKPHOS 98 79 84 69  PROT 6.5 6.3* 7.1 5.2*  ALBUMIN 3.5 3.6 4.1 2.1*    Assessment and Plan: RLQ abscess secondary to perforated appendicitis ? GIST tumor?  S/p perc drain 5/25 with bloody purulent output, follow Cx, wbc, continue flushes and monitor daily output  Plans per CCS/TRH C. Diff, being treated and loose stools improving.  History of GIST tumor    Signed: Hedy Jacob 05/14/2015, 12:42 PM   I spent a total of 15 Minutes in face to face in clinical consultation/evaluation, greater than 50% of which was counseling/coordinating care for RLQ abscess.

## 2015-05-14 NOTE — Progress Notes (Signed)
Physical Therapy Treatment Patient Details Name: [name] [name] MRN: [id number] DOB: 1929/05/28 Today's Date: 05/14/2015    History of Present Illness 81 yo male admitted with SIRS. hx of NSTEMI, HTN, abdominal carcinomatosis.     PT Comments    Assisted pt OOb to amb in hallway.  No c/o ABD pain.   Follow Up Recommendations  Home health PT     Equipment Recommendations  None recommended by PT    Recommendations for Other Services       Precautions / Restrictions Precautions Precautions: Fall    Mobility  Bed Mobility Overal bed mobility: Needs Assistance Bed Mobility: Supine to Sit     Supine to sit: Supervision;Min guard     General bed mobility comments: increased time   Transfers Overall transfer level: Needs assistance Equipment used: None Transfers: Sit to/from Stand Sit to Stand: Min assist         General transfer comment: increased time  Ambulation/Gait Ambulation/Gait assistance: Min guard Ambulation Distance (Feet): 475 Feet Assistive device: None   Gait velocity: decreased   General Gait Details: holding to IV pole.  Increased time.  slow but steady gait.    Stairs            Wheelchair Mobility    Modified Rankin (Stroke Patients Only)       Balance                                    Cognition Arousal/Alertness: Awake/alert Behavior During Therapy: WFL for tasks assessed/performed Overall Cognitive Status: Within Functional Limits for tasks assessed                      Exercises      General Comments        Pertinent Vitals/Pain Pain Assessment: No/denies pain    Home Living                      Prior Function            PT Goals (current goals can now be found in the care plan section) Progress towards PT goals: Progressing toward goals    Frequency  Min 3X/week    PT Plan      Co-evaluation             End of Session Equipment Utilized During Treatment: Gait  belt Activity Tolerance: Patient tolerated treatment well Patient left: in chair;with call bell/phone within reach     Time: 1147-1212 PT Time Calculation (min) (ACUTE ONLY): 25 min  Charges:  $Gait Training: 8-22 mins $Therapeutic Activity: 8-22 mins                    G Codes:      [name] [name]  PTA [facility]  Acute  Rehab Pager      [phone]

## 2015-05-14 NOTE — Progress Notes (Signed)
IP PROGRESS NOTE  Subjective:  He reports improvement in the pain following the drainage procedure yesterday.  Objective: Vital signs in last 24 hours: Blood pressure 148/58, pulse 104, temperature 98.7 F (37.1 C), temperature source Oral, resp. rate 20, height 5' 7"  (1.702 m), weight 172 lb 2.9 oz (78.1 kg), SpO2 97 %.  Intake/Output from previous day: 05/25 0701 - 05/26 0700 In: 1083.8 [I.V.:1028.8; IV Piggyback:50] Out: 1950 [Urine:800; Drains:620; Stool:1]  Physical Exam:  Abdomen: The abdomen is distended, mild tenderness in the right lower abdomen. Drain in place at the right lower abdomen.    Lab Results:  Recent Labs  05/14/15 0430  WBC 12.6*  HGB 8.7*  HCT 26.1*  PLT 379    BMET  Recent Labs  05/14/15 0430  NA 140  K 4.4  CL 111  CO2 23  GLUCOSE 99  BUN 22*  CREATININE 1.08  CALCIUM 7.6*    Studies/Results: Ct Abdomen Pelvis W Contrast  05/13/2015   CLINICAL DATA:  Right lower quadrant pain. History of gist tumor. Nausea, abdominal distention and diarrhea.  EXAM: CT ABDOMEN AND PELVIS WITH CONTRAST  TECHNIQUE: Multidetector CT imaging of the abdomen and pelvis was performed using the standard protocol following bolus administration of intravenous contrast.  CONTRAST:  164m OMNIPAQUE IOHEXOL 300 MG/ML  SOLN  COMPARISON:  05/05/2015  FINDINGS: Lower chest: There are bilateral pleural effusions left greater night. New from previous exam. Overlying compressive type atelectasis/consolidation noted.  Hepatobiliary: No focal liver abnormality identified. The gallbladder is unremarkable. There is no biliary dilatation.  Pancreas: Negative  Spleen: Normal appearance of the spleen.  Adrenals/Urinary Tract: Normal adrenal glands. Normal appearance of the kidneys. Urinary bladder is collapsed chronic Foley catheter balloon.  Stomach/Bowel: Small hiatal hernia. The stomach is otherwise unremarkable. Mild increase caliber of the small bowel loops which measure up to 3.7  cm. There are areas of small bowel wall thickening noted, image number 43/series 2 Enteric contrast material is identified within the small bowel loops as well as the colon up to the level of the rectum. There is a large right inguinal hernia which contains fluid and a nonobstructed loop of small bowel.  Vascular/Lymphatic: Calcified atherosclerotic disease involves the abdominal aorta. No aneurysm. No retroperitoneal adenopathy identified. There is no pelvic or inguinal adenopathy.  Reproductive: Prostate gland and seminal vesicles are unremarkable.  Other: Within the right lower quadrant of the abdomen in the area of the previously noted distended tubular structure there is a multi loculated cystic mass which measures 6.4 x 6.2 by 4.0 cm. Again noted is abdominal and pelvic ascites. On today's study this appears more loculated. For example, along the inferior surface of the right lobe of liver there is loculated fluid, image number 38/series 2.  Musculoskeletal: No aggressive lytic or sclerotic bone lesions.  IMPRESSION: 1. Multi loculated cystic mass is identified within the right lower quadrant of the abdomen and is new from previous exam. This is worrisome for abscess which may be the sequelae of a perforated appendix. 2. There is a persistent ascites within the abdomen and pelvis which now appears more loculated and suggestive of peritonitis. 3. Increase caliber of small bowel loops worrisome for enteritis and ileus. Additionally, there is a right inguinal hernia which contains a loop of small bowel. This could be partially obstructed. There is no evidence for high-grade bowel obstruction however. 4. Critical Value/emergent results were called by telephone at the time of interpretation on 05/13/2015 at 1:10 pm to Dr. IDalbert Batman,  who verbally acknowledged these results.   Electronically Signed   By: Kerby Moors M.D.   On: 05/13/2015 13:13   Ct Image Guided Drainage By Percutaneous Catheter  05/13/2015    CLINICAL DATA:  Right lower quadrant perforated appendicitis with abscess  EXAM: CT GUIDED DRAINAGE OF RIGHT LOWER QUADRANT APPENDICEAL ABSCESS  ANESTHESIA/SEDATION: 1.0 Mg IV Versed 25 mcg IV Fentanyl  Total Moderate Sedation Time:  15 minutes  PROCEDURE: The procedure, risks, benefits, and alternatives were explained to the patient. Questions regarding the procedure were encouraged and answered. The patient understands and consents to the procedure.  The right lower quadrant was prepped with Betadinein a sterile fashion, and a sterile drape was applied covering the operative field. A sterile gown and sterile gloves were used for the procedure. Local anesthesia was provided with 1% Lidocaine.  Previous imaging reviewed. Patient positioned supine. Noncontrast localization CT performed through the lower abdomen. The right lower quadrant loculated abscess was localized. Under sterile conditions and local anesthesia, an 18 gauge 10 cm access needle was advanced from a lateral approach into the collection. Syringe aspiration yielded purulent fluid compatible with abscess. Amplatz guidewire inserted followed by tract dilatation to advance a 10 [place] drain. Retention loop formed in the abscess cavity and confirmed with CT. Syringe aspiration yielded 23 cc bloody purulent fluid. Sample sent for Gram stain and culture. Catheter secured with a Prolene suture and connected to external suction bulb. Sterile dressing applied.  COMPLICATIONS: None immediate  FINDINGS: Imaging confirms needle placement in the right lower quadrant loculated abscess for drainage  IMPRESSION: Successful CT-guided right lower quadrant appendiceal abscess drain insertion.   Electronically Signed   By: Jerilynn Mages.  Shick M.D.   On: 05/13/2015 16:23    Medications: I have reviewed the patient's current medications.  Assessment/Plan:  1. 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 History of anemia, status post a nondiagnostic bone marrow biopsy 10/21/2014  3. NSTEMI March 2014  4. Admission 05/06/2015 with acute onset right abdomen pain-potentially related to acute appendicitis versus pain from carcinomatosis  CT 05/13/2015 consistent with a right lower abdomen abscess, status post catheter drainage by interventional radiology 05/13/2015  5. C. difficile colitis 05/10/2015   He was admitted with abdominal pain and inflammatory changes in the right lower abdomen on CT. He has developed a right abdominal abscess. He may have perforated appendicitis versus an abscess related to a perforated area of tumor. I discussed the case with Dr. [name]. I recommend continuing [place] as it is likely the metastatic GIST will progress rapidly if Gleevec is discontinued. Recommendations: 1.Continue Gleevec 2. Management of the right abdominal abscess per surgery and interventional radiology 3. Please call Oncology as needed.   LOS: 8 days   Raylon Lamson  05/14/2015, 2:03 PM

## 2015-05-14 NOTE — Progress Notes (Signed)
Progress Note   [name] [name] FKC:[id number] DOB: 1929-09-12 DOA: 05/05/2015 PCP: Abigail Miyamoto, MD   Brief Narrative:   [name] [name] is an 81 y.o. male with h/o GIST tumor, peritoneal carcinomatosis, being treated with [place] who was admitted 05/05/15 with a chief complaint of abdominal pain associated with nausea. A CT scan, done on admission, showed possible findings consistent with appendicitis versus progressive abdominal carcinomatosis. The patient was placed on empiric Zosyn. He had significant abdominal distention and on 05/10/15 developed diarrhea with subsequent C. difficile testing positive. Flagyl was started at that time. General surgery continues to follow for possible appendicitis, which is being treated medically with antibiotics. Oncology also following.  Assessment/Plan:   Principal Problem:   Sepsis vs SIRS secondary to appendicitis versus abdominal carcinomatosis versus C. difficile colitis with associated abdominal pain - Status post vigorous hydration in the ED. Blood cultures negative to date. - IR consulted for paracentesis to rule out SBP.  Only trace ascites, so could not be done. - Initially treated with empiric zosyn for appendicitis although not entirely clear based on CT findings.   - Narrowed to Rocephin 05/11/15 given C diff +. - Repeat CT scan ordered for 05/13/15 by surgeon showed loculated fluid collection, underwent IR drain placement and bloody pus drained out. His abdominal pain is much improved after the drain was placed. Recommend continue the drain and IR is following. Follow up with abscess drain cultures.  - Continue pain control efforts with Dilaudid.  Abdomen remains very distended.  Active Problems:   C. Diff diarrhea - The patient developed explosive diarrhea 05/10/15. C. difficile positive.  - Continue Flagyl.  Contact isolation. His stools are improving and are more formed.     Urinary retention, likely secondary to pain medication adverse  effect versus tumor causing obstruction of urethra - Foley catheter placed 05/07/15. - Renal ultrasound negative for hydronephrosis.    Chronic anemia, multifactorial with recent ABLA, anemia of chronic disease contributory - Hemoglobin drop likely dilutional.  Baseline hemoglobin 10.9.  Current hemoglobin stable at 8.7.recommend PRBC transfusion when hemoglobin is less than 7.     Acute kidney failure, pre-renal from dehydration in the setting of stage III CKD - Renal function improved with IVF.  Baseline creatinine 1.2.  Creatinine back to usual baseline values.    GIST (gastrointestinal stromal tumor) of small bowel, malignant - Dr. [name] following, recommendations to continue Gleevac as it might recurr.     Hypertension - Continue metoprolol and Norvasc. Hydrochlorothiazide on hold.    Hyperlipidemia - Continue Zocor.    DVT Prophylaxis - SCDs ordered.  Code Status: Full. Family Communication: none at bedside Disposition Plan: pending further management.   IV Access:    Peripheral IV   Procedures and diagnostic studies:   Ct Abdomen Pelvis Wo Contrast  05/06/2015   CLINICAL DATA:  Mid right-sided abdominal pain, onset tonight. Nausea without vomiting. History of GI stromal tumor of small bowel.  EXAM: CT ABDOMEN AND PELVIS WITHOUT CONTRAST  TECHNIQUE: Multidetector CT imaging of the abdomen and pelvis was performed following the standard protocol without IV contrast.  COMPARISON:  CT 01/13/2015  FINDINGS: Resolution of previous left pleural effusion. Depending ground-glass opacity in the right lower lobe consistent with atelectasis. The heart is enlarged.  Lack of IV contrast limits assessment for focal hepatic lesion, allowing for this, no focal lesion is seen. Gallstones are seen within the gallbladder which is physiologically distended. Small amount perihepatic ascites, the degree of which  is slightly increased from prior exam. Spleen is normal in size, development of  small volume of perisplenic ascites.  Mesenteric and omental edema and ill-defined soft tissue omental densities consistent with known peritoneal disease. Soft tissue nodule in the midline upper abdomen measures 2.8 x 2.2 cm, previously 3.5 x 2.3 cm.  Small and large bowel are suboptimally assessed given lack contrast and haziness throughout the abdomen. The stomach through is distended with fluid, small amount of fluid in the distal esophagus is small hiatal hernia. Enteric anastomosis in the mid to left abdomen. Bowel loops in the right lower quadrant of the abdomen are suboptimally defined. There is hyperattenuation within a tubular structure in the right lower quadrant that may reflect the appendix, however neoplasm is not entirely excluded. There is increased soft tissue density/fluid and edema about this structure which may reflect increased ascites. Edema and fluid tracks in the right inguinal canal with possible peritoneal implant in the inguinal canal. There are multiple colonic diverticula containing retained barium from prior exams.  No adrenal nodule. Pancreas is normal. There is no hydronephrosis, kidneys are symmetric in size. Questionable soft tissue density in the region of the porta, may reflect periportal lymph nodes.  Dense atherosclerosis of the abdominal aorta without aneurysm. Small retroperitoneal soft tissue densities.  In the pelvis the bladder is physiologically distended. Prostate gland is normal in size. Small amount pelvic ascites.  No lytic or blastic osseous lesions. Degenerative change in the spine.  IMPRESSION: 1. Increased intra-abdominal ascites in the right and left upper quadrant and right lower quadrant, the degree of which remains mild. There is increased peritoneal and omental edema of the right lower quadrant. This edema and soft tissue density tracks into the right inguinal canal which may reflect progression of known metastatic disease, versus decompressed small bowel  tracking in the right inguinal hernia. No proximal small bowel dilatation to suggest obstruction. 2. There is however interval improvement in size of dominant omental nodule in the anterior upper abdomen from prior. 3. Tubular structure in the right lower quadrant containing high-density. This high-density was seen on prior exam, it is unclear whether this represents the appendix with retained barium versus related to neoplasm or metastatic disease. There is overall increase in the fluid and soft tissue in the right lower quadrant, in addition adjacent to these tubular structure, however the appearance does not favor that of appendicitis. 4. Cholelithiasis without findings of acute cholecystitis. Diverticulosis without diverticulitis.   Electronically Signed   By: Jeb Levering M.D.   On: 05/06/2015 00:17   Dg Abd 1 View  05/10/2015   CLINICAL DATA:  Abdominal distention. 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  EXAM: ABDOMEN - 1 VIEW  COMPARISON:  Abdomen and pelvis CT dated 05/05/2015.  FINDINGS: Mildly dilated small bowel loops in the left upper abdomen. Normal caliber gas filled right and transverse colon. Unremarkable bones.  IMPRESSION: Mild proximal small bowel ileus or partial obstruction.   Electronically Signed   By: Claudie Revering M.D.   On: 05/10/2015 11:47   Ct Abdomen Pelvis W Contrast  05/13/2015   CLINICAL DATA:  Right lower quadrant pain. History of gist tumor. Nausea, abdominal distention and diarrhea.  EXAM: CT ABDOMEN AND PELVIS WITH CONTRAST  TECHNIQUE: Multidetector CT imaging of the abdomen and pelvis was performed using the standard protocol following bolus administration of intravenous contrast.  CONTRAST:  128mL OMNIPAQUE IOHEXOL 300 MG/ML  SOLN  COMPARISON:  05/05/2015  FINDINGS: Lower chest: There are bilateral  pleural effusions left greater night. New from previous exam. Overlying compressive type atelectasis/consolidation noted.  Hepatobiliary: No focal liver  abnormality identified. The gallbladder is unremarkable. There is no biliary dilatation.  Pancreas: Negative  Spleen: Normal appearance of the spleen.  Adrenals/Urinary Tract: Normal adrenal glands. Normal appearance of the kidneys. Urinary bladder is collapsed chronic Foley catheter balloon.  Stomach/Bowel: Small hiatal hernia. The stomach is otherwise unremarkable. Mild increase caliber of the small bowel loops which measure up to 3.7 cm. There are areas of small bowel wall thickening noted, image number 43/series 2 Enteric contrast material is identified within the small bowel loops as well as the colon up to the level of the rectum. There is a large right inguinal hernia which contains fluid and a nonobstructed loop of small bowel.  Vascular/Lymphatic: Calcified atherosclerotic disease involves the abdominal aorta. No aneurysm. No retroperitoneal adenopathy identified. There is no pelvic or inguinal adenopathy.  Reproductive: Prostate gland and seminal vesicles are unremarkable.  Other: Within the right lower quadrant of the abdomen in the area of the previously noted distended tubular structure there is a multi loculated cystic mass which measures 6.4 x 6.2 by 4.0 cm. Again noted is abdominal and pelvic ascites. On today's study this appears more loculated. For example, along the inferior surface of the right lobe of liver there is loculated fluid, image number 38/series 2.  Musculoskeletal: No aggressive lytic or sclerotic bone lesions.  IMPRESSION: 1. Multi loculated cystic mass is identified within the right lower quadrant of the abdomen and is new from previous exam. This is worrisome for abscess which may be the sequelae of a perforated appendix. 2. There is a persistent ascites within the abdomen and pelvis which now appears more loculated and suggestive of peritonitis. 3. Increase caliber of small bowel loops worrisome for enteritis and ileus. Additionally, there is a right inguinal hernia which contains  a loop of small bowel. This could be partially obstructed. There is no evidence for high-grade bowel obstruction however. 4. Critical Value/emergent results were called by telephone at the time of interpretation on 05/13/2015 at 1:10 pm to Dr. [name] , who verbally acknowledged these results.   Electronically Signed   By: Kerby Moors M.D.   On: 05/13/2015 13:13   US Renal  05/07/2015   CLINICAL DATA:  Urinary retention.  EXAM: RENAL / URINARY TRACT ULTRASOUND COMPLETE  COMPARISON:  CT scan abdomen dated 05/05/2015  FINDINGS: Right Kidney:  Length: 10.9 cm. Echogenicity within normal limits. No mass or hydronephrosis visualized.  Left Kidney:  Length: 11.5 cm. Echogenicity within normal limits. No mass or hydronephrosis visualized.  Bladder:  The bladder is empty with a Foley catheter in place.  IMPRESSION: Normal kidneys.  No hydronephrosis or other abnormality.   Electronically Signed   By: Lorriane Shire M.D.   On: 05/07/2015 17:27   US Abdomen Limited  05/06/2015   CLINICAL DATA:  Abdominal pain.  Evaluate for pocket of ascites.  EXAM: LIMITED ABDOMEN ULTRASOUND FOR ASCITES  TECHNIQUE: Limited ultrasound survey for ascites was performed in all four abdominal quadrants.  COMPARISON:  CT abdomen 05/05/2015.  FINDINGS: A drainable pocket of ascites was not identified in any of the 4 quadrants.  IMPRESSION: As above.   Electronically Signed   By: Rolla Flatten M.D.   On: 05/06/2015 12:45   Ct Image Guided Drainage By Percutaneous Catheter  05/13/2015   CLINICAL DATA:  Right lower quadrant perforated appendicitis with abscess  EXAM: CT GUIDED DRAINAGE OF RIGHT  LOWER QUADRANT APPENDICEAL ABSCESS  ANESTHESIA/SEDATION: 1.0 Mg IV Versed 25 mcg IV Fentanyl  Total Moderate Sedation Time:  15 minutes  PROCEDURE: The procedure, risks, benefits, and alternatives were explained to the patient. Questions regarding the procedure were encouraged and answered. The patient understands and consents to the procedure.  The  right lower quadrant was prepped with Betadinein a sterile fashion, and a sterile drape was applied covering the operative field. A sterile gown and sterile gloves were used for the procedure. Local anesthesia was provided with 1% Lidocaine.  Previous imaging reviewed. Patient positioned supine. Noncontrast localization CT performed through the lower abdomen. The right lower quadrant loculated abscess was localized. Under sterile conditions and local anesthesia, an 18 gauge 10 cm access needle was advanced from a lateral approach into the collection. Syringe aspiration yielded purulent fluid compatible with abscess. Amplatz guidewire inserted followed by tract dilatation to advance a 10 [place] drain. Retention loop formed in the abscess cavity and confirmed with CT. Syringe aspiration yielded 23 cc bloody purulent fluid. Sample sent for Gram stain and culture. Catheter secured with a Prolene suture and connected to external suction bulb. Sterile dressing applied.  COMPLICATIONS: None immediate  FINDINGS: Imaging confirms needle placement in the right lower quadrant loculated abscess for drainage  IMPRESSION: Successful CT-guided right lower quadrant appendiceal abscess drain insertion.   Electronically Signed   By: Jerilynn Mages.  Shick M.D.   On: 05/13/2015 16:23     Medical Consultants:    Oncology  Surgery  Anti-Infectives:    Zosyn 05/05/15--->05/11/15  Rocephin 05/11/15--->  Flagyl 05/10/15--->  Subjective:   [name] [name]  No nausea or vomiting. Abdominal pain improved.   Objective:    Filed Vitals:   05/13/15 1807 05/13/15 2117 05/14/15 0647 05/14/15 1440  BP: 151/57 146/62 148/58 136/49  Pulse: 88 89 104 89  Temp: 98.1 F (36.7 C) 97.9 F (36.6 C) 98.7 F (37.1 C) 98.2 F (36.8 C)  TempSrc: Oral Oral Oral Oral  Resp: 20 18 20 20   Height:      Weight:   78.1 kg (172 lb 2.9 oz)   SpO2: 97% 96% 97% 98%    Intake/Output Summary (Last 24 hours) at 05/14/15 1806 Last data filed at 05/14/15  1800  Gross per 24 hour  Intake 1573.75 ml  Output    922 ml  Net 651.75 ml    Exam: Gen:  NAD Cardiovascular:  Tachy, No M/R/G Respiratory:  Lungs CTAB Gastrointestinal:  Abdomen distended, tender RLQ, +BS Extremities:  1+ edema BLE, right hand swollen   Data Reviewed:    Labs: Basic Metabolic Panel:  Recent Labs Lab 05/08/15 0404 05/10/15 0537 05/11/15 0546 05/14/15 0430  NA 140 143 141 140  K 4.1 4.2 4.1 4.4  CL 108 113* 110 111  CO2 24 23 24 23   GLUCOSE 144* 113* 114* 99  BUN 32* 28* 28* 22*  CREATININE 1.17 1.19 1.18 1.08  CALCIUM 8.1* 8.6* 8.3* 7.6*   GFR Estimated Creatinine Clearance: 47.6 mL/min (by C-G formula based on Cr of 1.08). Liver Function Tests:  Recent Labs Lab 05/14/15 0430  AST 20  ALT 23  ALKPHOS 69  BILITOT 0.9  PROT 5.2*  ALBUMIN 2.1*   No results for input(s): LIPASE, AMYLASE in the last 168 hours. Coagulation profile No results for input(s): INR, PROTIME in the last 168 hours.  CBC:  Recent Labs Lab 05/08/15 0404 05/10/15 0537 05/11/15 0546 05/14/15 0430  WBC 11.0* 9.9 10.0 12.6*  HGB 9.4* 8.9*  9.3* 8.7*  HCT 27.7* 27.0* 27.5* 26.1*  MCV 104.1* 104.7* 103.8* 104.4*  PLT 161 189 230 379   BNP (last 3 results)  Recent Labs  09/12/14 0445  PROBNP 5726.0*   Sepsis Labs:  Recent Labs Lab 05/08/15 0404 05/10/15 0537 05/11/15 0546 05/14/15 0430  WBC 11.0* 9.9 10.0 12.6*   Microbiology Recent Results (from the past 240 hour(s))  Urine culture     Status: None   Collection Time: 05/06/15  1:40 AM  Result Value Ref Range Status   Specimen Description URINE, CATHETERIZED  Final   Special Requests NONE  Final   Colony Count NO GROWTH Performed at [facility]   Final   Culture NO GROWTH Performed at [facility]   Final   Report Status 05/07/2015 FINAL  Final  Culture, blood (routine x 2)     Status: None   Collection Time: 05/06/15  3:04 AM  Result Value Ref Range Status   Specimen  Description BLOOD RIGHT HAND  Final   Special Requests Immunocompromised  Final   Culture   Final    NO GROWTH 5 DAYS Performed at [facility]    Report Status 05/12/2015 FINAL  Final  Culture, blood (routine x 2)     Status: None   Collection Time: 05/06/15  3:04 AM  Result Value Ref Range Status   Specimen Description LEFT ANTECUBITAL  Final   Special Requests Immunocompromised  Final   Culture   Final    NO GROWTH 5 DAYS Performed at [facility]    Report Status 05/12/2015 FINAL  Final  Clostridium Difficile by PCR     Status: Abnormal   Collection Time: 05/10/15 11:00 AM  Result Value Ref Range Status   C difficile by pcr POSITIVE (A) NEGATIVE Final    Comment: CRITICAL RESULT CALLED TO, READ BACK BY AND VERIFIED WITH: I HABIB AT 1340 ON 05.22.2016 BY NBROOKS   Culture, routine-abscess     Status: None (Preliminary result)   Collection Time: 05/13/15  5:08 PM  Result Value Ref Range Status   Specimen Description ABSCESS  Final   Special Requests Normal  Final   Gram Stain   Final    ABUNDANT WBC PRESENT, PREDOMINANTLY PMN NO SQUAMOUS EPITHELIAL CELLS SEEN RARE GRAM POSITIVE COCCI IN PAIRS Performed at [facility]    Culture   Final    Culture reincubated for better growth Performed at [facility]    Report Status PENDING  Incomplete  Anaerobic culture     Status: None (Preliminary result)   Collection Time: 05/13/15  5:09 PM  Result Value Ref Range Status   Specimen Description ABSCESS  Final   Special Requests Normal  Final   Gram Stain   Final    ABUNDANT WBC PRESENT, PREDOMINANTLY PMN NO SQUAMOUS EPITHELIAL CELLS SEEN RARE GRAM POSITIVE COCCI IN PAIRS Performed at [facility]    Culture   Final    NO ANAEROBES ISOLATED; CULTURE IN PROGRESS FOR 5 DAYS Performed at [facility]    Report Status PENDING  Incomplete     Medications:   . amLODipine  5 mg Oral Daily  . cefTRIAXone (ROCEPHIN)  IV  1 g  Intravenous Q24H  . imatinib  400 mg Oral Q breakfast  . metoprolol tartrate  25 mg Oral BID  . metroNIDAZOLE  500 mg Oral 3 times per day  . saccharomyces boulardii  250 mg Oral BID  .  simvastatin  20 mg Oral QPM  . sodium chloride  3 mL Intravenous Q12H   Continuous Infusions: . 0.9 % NaCl with KCl 40 mEq / L 75 mL/hr (05/14/15 0714)    Time spent: 25 minutes.   LOS: 8 days   [facility] Pager [phone]   If 7PM-7AM, please contact night-coverage at [email or web address], password [email or web address] for any overnight needs.  05/14/2015, 6:06 PM

## 2015-05-14 NOTE — Progress Notes (Signed)
Subjective: He feels a little better, has breakfast in front of him.  Still distended, no worse than yesterday, still tender, but a little better.  His drain has bloody purulent fluid coming from it.  Nothing on culture yet.   Stool is improving more substance, less liquid.  Objective: Vital signs in last 24 hours: Temp:  [97.9 F (36.6 C)-98.7 F (37.1 C)] 98.7 F (37.1 C) (05/26 0647) Pulse Rate:  [84-106] 104 (05/26 0647) Resp:  [18-24] 20 (05/26 0647) BP: (138-157)/(56-65) 148/58 mmHg (05/26 0647) SpO2:  [94 %-99 %] 97 % (05/26 0647) Weight:  [78.1 kg (172 lb 2.9 oz)] 78.1 kg (172 lb 2.9 oz) (05/26 0647) Last BM Date: 05/13/15   Nothing PO recorded 620 from the drain 1 stool recorded Afebrile, VSS WBC 12.6/  H/h is down some  Intake/Output from previous day: 05/25 0701 - 05/26 0700 In: 1083.8 [I.V.:1028.8; IV Piggyback:50] Out: 6073 [Urine:800; Drains:620; Stool:1] Intake/Output this shift:    General appearance: alert, cooperative and no distress GI: still distended, sore, no peritionitis, + BS, stool more formed now.  Lab Results:   Recent Labs  05/14/15 0430  WBC 12.6*  HGB 8.7*  HCT 26.1*  PLT 379    BMET  Recent Labs  05/14/15 0430  NA 140  K 4.4  CL 111  CO2 23  GLUCOSE 99  BUN 22*  CREATININE 1.08  CALCIUM 7.6*   PT/INR No results for input(s): LABPROT, INR in the last 72 hours.   Recent Labs Lab 05/14/15 0430  AST 20  ALT 23  ALKPHOS 69  BILITOT 0.9  PROT 5.2*  ALBUMIN 2.1*     Lipase     Component Value Date/Time   LIPASE 21* 05/05/2015 2137     Studies/Results: Ct Abdomen Pelvis W Contrast  05/13/2015   CLINICAL DATA:  Right lower quadrant pain. History of gist tumor. Nausea, abdominal distention and diarrhea.  EXAM: CT ABDOMEN AND PELVIS WITH CONTRAST  TECHNIQUE: Multidetector CT imaging of the abdomen and pelvis was performed using the standard protocol following bolus administration of intravenous contrast.   CONTRAST:  177mL OMNIPAQUE IOHEXOL 300 MG/ML  SOLN  COMPARISON:  05/05/2015  FINDINGS: Lower chest: There are bilateral pleural effusions left greater night. New from previous exam. Overlying compressive type atelectasis/consolidation noted.  Hepatobiliary: No focal liver abnormality identified. The gallbladder is unremarkable. There is no biliary dilatation.  Pancreas: Negative  Spleen: Normal appearance of the spleen.  Adrenals/Urinary Tract: Normal adrenal glands. Normal appearance of the kidneys. Urinary bladder is collapsed chronic Foley catheter balloon.  Stomach/Bowel: Small hiatal hernia. The stomach is otherwise unremarkable. Mild increase caliber of the small bowel loops which measure up to 3.7 cm. There are areas of small bowel wall thickening noted, image number 43/series 2 Enteric contrast material is identified within the small bowel loops as well as the colon up to the level of the rectum. There is a large right inguinal hernia which contains fluid and a nonobstructed loop of small bowel.  Vascular/Lymphatic: Calcified atherosclerotic disease involves the abdominal aorta. No aneurysm. No retroperitoneal adenopathy identified. There is no pelvic or inguinal adenopathy.  Reproductive: Prostate gland and seminal vesicles are unremarkable.  Other: Within the right lower quadrant of the abdomen in the area of the previously noted distended tubular structure there is a multi loculated cystic mass which measures 6.4 x 6.2 by 4.0 cm. Again noted is abdominal and pelvic ascites. On today's study this appears more loculated. For example, along the  inferior surface of the right lobe of liver there is loculated fluid, image number 38/series 2.  Musculoskeletal: No aggressive lytic or sclerotic bone lesions.  IMPRESSION: 1. Multi loculated cystic mass is identified within the right lower quadrant of the abdomen and is new from previous exam. This is worrisome for abscess which may be the sequelae of a perforated  appendix. 2. There is a persistent ascites within the abdomen and pelvis which now appears more loculated and suggestive of peritonitis. 3. Increase caliber of small bowel loops worrisome for enteritis and ileus. Additionally, there is a right inguinal hernia which contains a loop of small bowel. This could be partially obstructed. There is no evidence for high-grade bowel obstruction however. 4. Critical Value/emergent results were called by telephone at the time of interpretation on 05/13/2015 at 1:10 pm to Dr. [name] , who verbally acknowledged these results.   Electronically Signed   By: Kerby Moors M.D.   On: 05/13/2015 13:13   Ct Image Guided Drainage By Percutaneous Catheter  05/13/2015   CLINICAL DATA:  Right lower quadrant perforated appendicitis with abscess  EXAM: CT GUIDED DRAINAGE OF RIGHT LOWER QUADRANT APPENDICEAL ABSCESS  ANESTHESIA/SEDATION: 1.0 Mg IV Versed 25 mcg IV Fentanyl  Total Moderate Sedation Time:  15 minutes  PROCEDURE: The procedure, risks, benefits, and alternatives were explained to the patient. Questions regarding the procedure were encouraged and answered. The patient understands and consents to the procedure.  The right lower quadrant was prepped with Betadinein a sterile fashion, and a sterile drape was applied covering the operative field. A sterile gown and sterile gloves were used for the procedure. Local anesthesia was provided with 1% Lidocaine.  Previous imaging reviewed. Patient positioned supine. Noncontrast localization CT performed through the lower abdomen. The right lower quadrant loculated abscess was localized. Under sterile conditions and local anesthesia, an 18 gauge 10 cm access needle was advanced from a lateral approach into the collection. Syringe aspiration yielded purulent fluid compatible with abscess. Amplatz guidewire inserted followed by tract dilatation to advance a 10 [place] drain. Retention loop formed in the abscess cavity and confirmed with CT.  Syringe aspiration yielded 23 cc bloody purulent fluid. Sample sent for Gram stain and culture. Catheter secured with a Prolene suture and connected to external suction bulb. Sterile dressing applied.  COMPLICATIONS: None immediate  FINDINGS: Imaging confirms needle placement in the right lower quadrant loculated abscess for drainage  IMPRESSION: Successful CT-guided right lower quadrant appendiceal abscess drain insertion.   Electronically Signed   By: Jerilynn Mages.  Shick M.D.   On: 05/13/2015 16:23    Medications: . amLODipine  5 mg Oral Daily  . cefTRIAXone (ROCEPHIN)  IV  1 g Intravenous Q24H  . metoprolol tartrate  25 mg Oral BID  . metroNIDAZOLE  500 mg Oral 3 times per day  . saccharomyces boulardii  250 mg Oral BID  . simvastatin  20 mg Oral QPM  . sodium chloride  3 mL Intravenous Q12H   . 0.9 % NaCl with KCl 40 mEq / L 75 mL/hr (05/14/15 0714)    Assessment/Plan Abdominal pain RLQ, possible appendicitis vs carcinomatosis Hx of GIST tumor with carcinomatosis  EXPLORATORY LAPAROTOMY, OPEN RESECTION OF MESENTERIC AND INTESTINAL MASS; Surgeon: [name], MD, 06/01/13. C diff colitis - flagyl, and rocephin Hx of NSTEMI/CAD Hypertension Hx of Intraperitoneal heomrrhage, Acute respiratory failure, shock, 08/2014 Hx of chronic renal disease, stage III Anemia Dyslipidemia Antibiotics:6 days of Zosyn, 3 days of Flagyl, Rocephin 2 days completed Day  5 Flagyl, and day 4 Rocephin DVT: No chemical prophylaxis/SCD's ordered   Plan:  Continue antibiotics,  Labs OK, continue drain.  Consider Lovenox for DVT, but will defer to Dr. Karleen Hampshire    LOS: 8 days    Earnstine Regal 05/14/2015

## 2015-05-14 NOTE — Progress Notes (Signed)
Physical Therapy Treatment Patient Details Name: Johnney Scarlata MRN: [id number] DOB: 05-09-1929 Today's Date: 05/14/2015    History of Present Illness 81 yo male admitted with SIRS. hx of NSTEMI, HTN, abdominal carcinomatosis.     PT Comments    Assisted with amb pt in hallway.  Progressing well and no c/o ABD pain today.   Follow Up Recommendations  Home health PT     Equipment Recommendations  None recommended by PT    Recommendations for Other Services       Precautions / Restrictions Precautions Precautions: Fall    Mobility  Bed Mobility Overal bed mobility: Needs Assistance Bed Mobility: Supine to Sit     Supine to sit: Supervision;Min guard     General bed mobility comments: increased time   Transfers Overall transfer level: Needs assistance Equipment used: None Transfers: Sit to/from Stand Sit to Stand: Min assist         General transfer comment: increased time  Ambulation/Gait Ambulation/Gait assistance: Min guard Ambulation Distance (Feet): 475 Feet Assistive device: None   Gait velocity: decreased   General Gait Details: holding to IV pole.  Increased time.  slow but steady gait.    Stairs            Wheelchair Mobility    Modified Rankin (Stroke Patients Only)       Balance                                    Cognition Arousal/Alertness: Awake/alert Behavior During Therapy: WFL for tasks assessed/performed Overall Cognitive Status: Within Functional Limits for tasks assessed                      Exercises      General Comments        Pertinent Vitals/Pain Pain Assessment: No/denies pain    Home Living                      Prior Function            PT Goals (current goals can now be found in the care plan section) Progress towards PT goals: Progressing toward goals    Frequency  Min 3X/week    PT Plan      Co-evaluation             End of Session Equipment Utilized  During Treatment: Gait belt Activity Tolerance: Patient tolerated treatment well Patient left: in chair;with call bell/phone within reach     Time: 1147-1212 PT Time Calculation (min) (ACUTE ONLY): 25 min  Charges:  $Gait Training: 8-22 mins $Therapeutic Activity: 8-22 mins                    G Codes:      Rica Koyanagi  PTA [facility]  Acute  Rehab Pager      [phone]

## 2015-05-14 NOTE — Progress Notes (Signed)
Took over care of pt at 1500.  I agree with the previous nurses assessment.  Will continue to monitor closely.

## 2015-05-15 NOTE — Progress Notes (Signed)
Progress Note   [name] [name] ACZ:[id number] DOB: August 18, 1929 DOA: 05/05/2015 PCP: Abigail Miyamoto, MD   Brief Narrative:   [name] [name] is an 81 y.o. male with h/o GIST tumor, peritoneal carcinomatosis, being treated with [place] who was admitted 05/05/15 with a chief complaint of abdominal pain associated with nausea. A CT scan, done on admission, showed possible findings consistent with appendicitis versus progressive abdominal carcinomatosis. The patient was placed on empiric Zosyn. He had significant abdominal distention and on 05/10/15 developed diarrhea with subsequent C. difficile testing positive. Flagyl was started at that time. General surgery continues to follow for possible appendicitis, which is being treated medically with antibiotics. Oncology also following.  Assessment/Plan:   Principal Problem:   Sepsis vs SIRS secondary to appendicitis versus abdominal carcinomatosis versus C. difficile colitis with associated abdominal pain - Status post vigorous hydration in the ED. Blood cultures negative to date. - IR consulted for paracentesis to rule out SBP.  Only trace ascites, so could not be done. - Initially treated with empiric zosyn for appendicitis although not entirely clear based on CT findings.   - Narrowed to Rocephin 05/11/15 given C diff +. - Repeat CT scan ordered for 05/13/15 by surgeon showed loculated fluid collection, underwent IR drain placement and bloody pus drained out. His abdominal pain is much improved after the drain was placed. Recommend continue the drain and IR is following. Follow up with abscess drain cultures. Prelim reports shows multiple bacteria, final culture report pending.  - Continue pain control efforts with Dilaudid.    Active Problems:   C. Diff diarrhea - The patient developed explosive diarrhea 05/10/15. C. difficile positive.  - Continue Flagyl.  Contact isolation. His stools are improving and are more formed.     Urinary retention, likely  secondary to pain medication adverse effect versus tumor causing obstruction of urethra - Foley catheter placed 05/07/15. - Renal ultrasound negative for hydronephrosis.    Chronic anemia, multifactorial with recent ABLA, anemia of chronic disease contributory - Hemoglobin drop likely dilutional.  Baseline hemoglobin 10.9.  Current hemoglobin stable at 8.7.recommend PRBC transfusion when hemoglobin is less than 7.     Acute kidney failure, pre-renal from dehydration in the setting of stage III CKD - Renal function improved with IVF.  Baseline creatinine 1.2.  Creatinine back to usual baseline values.    GIST (gastrointestinal stromal tumor) of small bowel, malignant - Dr. [name] following, recommendations to continue Gleevac as it might recurr.     Hypertension - Continue metoprolol and Norvasc. Hydrochlorothiazide on hold.    Hyperlipidemia - Continue Zocor.    DVT Prophylaxis - SCDs ordered.  Code Status: Full. Family Communication: none at bedside Disposition Plan: pending further management. Possibly home with home PT early next week.   IV Access:    Peripheral IV   Procedures and diagnostic studies:   Ct Abdomen Pelvis Wo Contrast  05/06/2015   CLINICAL DATA:  Mid right-sided abdominal pain, onset tonight. Nausea without vomiting. History of GI stromal tumor of small bowel.  EXAM: CT ABDOMEN AND PELVIS WITHOUT CONTRAST  TECHNIQUE: Multidetector CT imaging of the abdomen and pelvis was performed following the standard protocol without IV contrast.  COMPARISON:  CT 01/13/2015  FINDINGS: Resolution of previous left pleural effusion. Depending ground-glass opacity in the right lower lobe consistent with atelectasis. The heart is enlarged.  Lack of IV contrast limits assessment for focal hepatic lesion, allowing for this, no focal lesion is seen. Gallstones are seen within  the gallbladder which is physiologically distended. Small amount perihepatic ascites, the degree of which is  slightly increased from prior exam. Spleen is normal in size, development of small volume of perisplenic ascites.  Mesenteric and omental edema and ill-defined soft tissue omental densities consistent with known peritoneal disease. Soft tissue nodule in the midline upper abdomen measures 2.8 x 2.2 cm, previously 3.5 x 2.3 cm.  Small and large bowel are suboptimally assessed given lack contrast and haziness throughout the abdomen. The stomach through is distended with fluid, small amount of fluid in the distal esophagus is small hiatal hernia. Enteric anastomosis in the mid to left abdomen. Bowel loops in the right lower quadrant of the abdomen are suboptimally defined. There is hyperattenuation within a tubular structure in the right lower quadrant that may reflect the appendix, however neoplasm is not entirely excluded. There is increased soft tissue density/fluid and edema about this structure which may reflect increased ascites. Edema and fluid tracks in the right inguinal canal with possible peritoneal implant in the inguinal canal. There are multiple colonic diverticula containing retained barium from prior exams.  No adrenal nodule. Pancreas is normal. There is no hydronephrosis, kidneys are symmetric in size. Questionable soft tissue density in the region of the porta, may reflect periportal lymph nodes.  Dense atherosclerosis of the abdominal aorta without aneurysm. Small retroperitoneal soft tissue densities.  In the pelvis the bladder is physiologically distended. Prostate gland is normal in size. Small amount pelvic ascites.  No lytic or blastic osseous lesions. Degenerative change in the spine.  IMPRESSION: 1. Increased intra-abdominal ascites in the right and left upper quadrant and right lower quadrant, the degree of which remains mild. There is increased peritoneal and omental edema of the right lower quadrant. This edema and soft tissue density tracks into the right inguinal canal which may reflect  progression of known metastatic disease, versus decompressed small bowel tracking in the right inguinal hernia. No proximal small bowel dilatation to suggest obstruction. 2. There is however interval improvement in size of dominant omental nodule in the anterior upper abdomen from prior. 3. Tubular structure in the right lower quadrant containing high-density. This high-density was seen on prior exam, it is unclear whether this represents the appendix with retained barium versus related to neoplasm or metastatic disease. There is overall increase in the fluid and soft tissue in the right lower quadrant, in addition adjacent to these tubular structure, however the appearance does not favor that of appendicitis. 4. Cholelithiasis without findings of acute cholecystitis. Diverticulosis without diverticulitis.   Electronically Signed   By: Jeb Levering M.D.   On: 05/06/2015 00:17   Dg Abd 1 View  05/10/2015   CLINICAL DATA:  Abdominal distention. 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  EXAM: ABDOMEN - 1 VIEW  COMPARISON:  Abdomen and pelvis CT dated 05/05/2015.  FINDINGS: Mildly dilated small bowel loops in the left upper abdomen. Normal caliber gas filled right and transverse colon. Unremarkable bones.  IMPRESSION: Mild proximal small bowel ileus or partial obstruction.   Electronically Signed   By: Claudie Revering M.D.   On: 05/10/2015 11:47   Ct Abdomen Pelvis W Contrast  05/13/2015   CLINICAL DATA:  Right lower quadrant pain. History of gist tumor. Nausea, abdominal distention and diarrhea.  EXAM: CT ABDOMEN AND PELVIS WITH CONTRAST  TECHNIQUE: Multidetector CT imaging of the abdomen and pelvis was performed using the standard protocol following bolus administration of intravenous contrast.  CONTRAST:  1110mL OMNIPAQUE IOHEXOL 300  MG/ML  SOLN  COMPARISON:  05/05/2015  FINDINGS: Lower chest: There are bilateral pleural effusions left greater night. New from previous exam. Overlying compressive type  atelectasis/consolidation noted.  Hepatobiliary: No focal liver abnormality identified. The gallbladder is unremarkable. There is no biliary dilatation.  Pancreas: Negative  Spleen: Normal appearance of the spleen.  Adrenals/Urinary Tract: Normal adrenal glands. Normal appearance of the kidneys. Urinary bladder is collapsed chronic Foley catheter balloon.  Stomach/Bowel: Small hiatal hernia. The stomach is otherwise unremarkable. Mild increase caliber of the small bowel loops which measure up to 3.7 cm. There are areas of small bowel wall thickening noted, image number 43/series 2 Enteric contrast material is identified within the small bowel loops as well as the colon up to the level of the rectum. There is a large right inguinal hernia which contains fluid and a nonobstructed loop of small bowel.  Vascular/Lymphatic: Calcified atherosclerotic disease involves the abdominal aorta. No aneurysm. No retroperitoneal adenopathy identified. There is no pelvic or inguinal adenopathy.  Reproductive: Prostate gland and seminal vesicles are unremarkable.  Other: Within the right lower quadrant of the abdomen in the area of the previously noted distended tubular structure there is a multi loculated cystic mass which measures 6.4 x 6.2 by 4.0 cm. Again noted is abdominal and pelvic ascites. On today's study this appears more loculated. For example, along the inferior surface of the right lobe of liver there is loculated fluid, image number 38/series 2.  Musculoskeletal: No aggressive lytic or sclerotic bone lesions.  IMPRESSION: 1. Multi loculated cystic mass is identified within the right lower quadrant of the abdomen and is new from previous exam. This is worrisome for abscess which may be the sequelae of a perforated appendix. 2. There is a persistent ascites within the abdomen and pelvis which now appears more loculated and suggestive of peritonitis. 3. Increase caliber of small bowel loops worrisome for enteritis and  ileus. Additionally, there is a right inguinal hernia which contains a loop of small bowel. This could be partially obstructed. There is no evidence for high-grade bowel obstruction however. 4. Critical Value/emergent results were called by telephone at the time of interpretation on 05/13/2015 at 1:10 pm to Dr. [name] , who verbally acknowledged these results.   Electronically Signed   By: Kerby Moors M.D.   On: 05/13/2015 13:13   US Renal  05/07/2015   CLINICAL DATA:  Urinary retention.  EXAM: RENAL / URINARY TRACT ULTRASOUND COMPLETE  COMPARISON:  CT scan abdomen dated 05/05/2015  FINDINGS: Right Kidney:  Length: 10.9 cm. Echogenicity within normal limits. No mass or hydronephrosis visualized.  Left Kidney:  Length: 11.5 cm. Echogenicity within normal limits. No mass or hydronephrosis visualized.  Bladder:  The bladder is empty with a Foley catheter in place.  IMPRESSION: Normal kidneys.  No hydronephrosis or other abnormality.   Electronically Signed   By: Lorriane Shire M.D.   On: 05/07/2015 17:27   US Abdomen Limited  05/06/2015   CLINICAL DATA:  Abdominal pain.  Evaluate for pocket of ascites.  EXAM: LIMITED ABDOMEN ULTRASOUND FOR ASCITES  TECHNIQUE: Limited ultrasound survey for ascites was performed in all four abdominal quadrants.  COMPARISON:  CT abdomen 05/05/2015.  FINDINGS: A drainable pocket of ascites was not identified in any of the 4 quadrants.  IMPRESSION: As above.   Electronically Signed   By: Rolla Flatten M.D.   On: 05/06/2015 12:45   Ct Image Guided Drainage By Percutaneous Catheter  05/13/2015   CLINICAL DATA:  Right lower quadrant perforated appendicitis with abscess  EXAM: CT GUIDED DRAINAGE OF RIGHT LOWER QUADRANT APPENDICEAL ABSCESS  ANESTHESIA/SEDATION: 1.0 Mg IV Versed 25 mcg IV Fentanyl  Total Moderate Sedation Time:  15 minutes  PROCEDURE: The procedure, risks, benefits, and alternatives were explained to the patient. Questions regarding the procedure were encouraged and  answered. The patient understands and consents to the procedure.  The right lower quadrant was prepped with Betadinein a sterile fashion, and a sterile drape was applied covering the operative field. A sterile gown and sterile gloves were used for the procedure. Local anesthesia was provided with 1% Lidocaine.  Previous imaging reviewed. Patient positioned supine. Noncontrast localization CT performed through the lower abdomen. The right lower quadrant loculated abscess was localized. Under sterile conditions and local anesthesia, an 18 gauge 10 cm access needle was advanced from a lateral approach into the collection. Syringe aspiration yielded purulent fluid compatible with abscess. Amplatz guidewire inserted followed by tract dilatation to advance a 10 [place] drain. Retention loop formed in the abscess cavity and confirmed with CT. Syringe aspiration yielded 23 cc bloody purulent fluid. Sample sent for Gram stain and culture. Catheter secured with a Prolene suture and connected to external suction bulb. Sterile dressing applied.  COMPLICATIONS: None immediate  FINDINGS: Imaging confirms needle placement in the right lower quadrant loculated abscess for drainage  IMPRESSION: Successful CT-guided right lower quadrant appendiceal abscess drain insertion.   Electronically Signed   By: Jerilynn Mages.  Shick M.D.   On: 05/13/2015 16:23     Medical Consultants:    Oncology  Surgery  Anti-Infectives:    Zosyn 05/05/15--->05/11/15  Rocephin 05/11/15--->  Flagyl 05/10/15--->  Subjective:   [name] [name]  No nausea or vomiting. Abdominal pain improved. Cheerful, family around.  Objective:    Filed Vitals:   05/14/15 1440 05/14/15 2055 05/15/15 0459 05/15/15 1300  BP: 136/49 135/53 127/54 146/59  Pulse: 89 83 91 91  Temp: 98.2 F (36.8 C) 98 F (36.7 C) 98.2 F (36.8 C) 98.4 F (36.9 C)  TempSrc: Oral Oral Oral Oral  Resp: 20 18  20   Height:      Weight:   80.604 kg (177 lb 11.2 oz)   SpO2: 98% 97% 97%  99%    Intake/Output Summary (Last 24 hours) at 05/15/15 1504 Last data filed at 05/15/15 1340  Gross per 24 hour  Intake   3365 ml  Output   1160 ml  Net   2205 ml    Exam: Gen:  NAD Cardiovascular:  Tachy, No M/R/G Respiratory:  Lungs CTAB Gastrointestinal:  Abdomen distended,no tenderness. , +BS Extremities: upper extremity swelling much improved.    Data Reviewed:    Labs: Basic Metabolic Panel:  Recent Labs Lab 05/10/15 0537 05/11/15 0546 05/14/15 0430  NA 143 141 140  K 4.2 4.1 4.4  CL 113* 110 111  CO2 23 24 23   GLUCOSE 113* 114* 99  BUN 28* 28* 22*  CREATININE 1.19 1.18 1.08  CALCIUM 8.6* 8.3* 7.6*   GFR Estimated Creatinine Clearance: 51.8 mL/min (by C-G formula based on Cr of 1.08). Liver Function Tests:  Recent Labs Lab 05/14/15 0430  AST 20  ALT 23  ALKPHOS 69  BILITOT 0.9  PROT 5.2*  ALBUMIN 2.1*   No results for input(s): LIPASE, AMYLASE in the last 168 hours. Coagulation profile No results for input(s): INR, PROTIME in the last 168 hours.  CBC:  Recent Labs Lab 05/10/15 0537 05/11/15 0546 05/14/15 0430  WBC 9.9 10.0  12.6*  HGB 8.9* 9.3* 8.7*  HCT 27.0* 27.5* 26.1*  MCV 104.7* 103.8* 104.4*  PLT 189 230 379   BNP (last 3 results)  Recent Labs  09/12/14 0445  PROBNP 5726.0*   Sepsis Labs:  Recent Labs Lab 05/10/15 0537 05/11/15 0546 05/14/15 0430  WBC 9.9 10.0 12.6*   Microbiology Recent Results (from the past 240 hour(s))  Urine culture     Status: None   Collection Time: 05/06/15  1:40 AM  Result Value Ref Range Status   Specimen Description URINE, CATHETERIZED  Final   Special Requests NONE  Final   Colony Count NO GROWTH Performed at [facility]   Final   Culture NO GROWTH Performed at [facility]   Final   Report Status 05/07/2015 FINAL  Final  Culture, blood (routine x 2)     Status: None   Collection Time: 05/06/15  3:04 AM  Result Value Ref Range Status   Specimen Description  BLOOD RIGHT HAND  Final   Special Requests Immunocompromised  Final   Culture   Final    NO GROWTH 5 DAYS Performed at [facility]    Report Status 05/12/2015 FINAL  Final  Culture, blood (routine x 2)     Status: None   Collection Time: 05/06/15  3:04 AM  Result Value Ref Range Status   Specimen Description LEFT ANTECUBITAL  Final   Special Requests Immunocompromised  Final   Culture   Final    NO GROWTH 5 DAYS Performed at [facility]    Report Status 05/12/2015 FINAL  Final  Clostridium Difficile by PCR     Status: Abnormal   Collection Time: 05/10/15 11:00 AM  Result Value Ref Range Status   C difficile by pcr POSITIVE (A) NEGATIVE Final    Comment: CRITICAL RESULT CALLED TO, READ BACK BY AND VERIFIED WITH: I HABIB AT 1340 ON 05.22.2016 BY NBROOKS   Culture, routine-abscess     Status: None (Preliminary result)   Collection Time: 05/13/15  5:08 PM  Result Value Ref Range Status   Specimen Description ABSCESS  Final   Special Requests Normal  Final   Gram Stain   Final    ABUNDANT WBC PRESENT, PREDOMINANTLY PMN NO SQUAMOUS EPITHELIAL CELLS SEEN RARE GRAM POSITIVE COCCI IN PAIRS Performed at [facility]    Culture   Final    MULTIPLE ORGANISMS PRESENT, NONE PREDOMINANT Performed at [facility]    Report Status PENDING  Incomplete  Anaerobic culture     Status: None (Preliminary result)   Collection Time: 05/13/15  5:09 PM  Result Value Ref Range Status   Specimen Description ABSCESS  Final   Special Requests Normal  Final   Gram Stain   Final    ABUNDANT WBC PRESENT, PREDOMINANTLY PMN NO SQUAMOUS EPITHELIAL CELLS SEEN RARE GRAM POSITIVE COCCI IN PAIRS Performed at [facility]    Culture   Final    NO ANAEROBES ISOLATED; CULTURE IN PROGRESS FOR 5 DAYS Performed at [facility]    Report Status PENDING  Incomplete     Medications:   . amLODipine  5 mg Oral Daily  . cefTRIAXone (ROCEPHIN)  IV  1 g  Intravenous Q24H  . imatinib  400 mg Oral Q breakfast  . metoprolol tartrate  25 mg Oral BID  . metroNIDAZOLE  500 mg Oral 3 times per day  . saccharomyces boulardii  250 mg Oral BID  . simvastatin  20 mg Oral QPM  . sodium chloride  3 mL Intravenous Q12H   Continuous Infusions: . 0.9 % NaCl with KCl 40 mEq / L 75 mL/hr (05/15/15 1411)    Time spent: 25 minutes.   LOS: 9 days   [facility] Pager [phone]   If 7PM-7AM, please contact night-coverage at [email or web address], password [email or web address] for any overnight needs.  05/15/2015, 3:04 PM

## 2015-05-15 NOTE — Progress Notes (Signed)
Patient ID: [name] [name], male   DOB: September 06, 1929, 81 y.o.   MRN: [id number]    Referring Physician(s): CCS  Subjective:  Pt feeling better; having more formed stools, denies sig abd pain,N/V; has ambulated some; tol diet ok  Allergies: Review of patient's allergies indicates no known allergies.  Medications: Prior to Admission medications   Medication Sig Start Date End Date Taking? Authorizing Provider  amLODipine (NORVASC) 5 MG tablet Take 5 mg by mouth daily. 03/17/15  Yes Historical Provider, MD  hydrochlorothiazide (HYDRODIURIL) 25 MG tablet Take 25 mg by mouth daily.   Yes Historical Provider, MD  imatinib (GLEEVEC) 400 MG tablet Take 1 tablet (400 mg total) by mouth daily. Take with meals and large glass of water.Caution:Chemotherapy. 02/13/15  Yes Ladell Pier, MD  metoprolol tartrate (LOPRESSOR) 25 MG tablet Take 1 tablet (25 mg total) by mouth 2 (two) times daily. 03/06/13  Yes Costin Karlyne Greenspan, MD  simvastatin (ZOCOR) 20 MG tablet Take 20 mg by mouth every evening.   Yes Historical Provider, MD     Vital Signs: BP 127/54 mmHg  Pulse 91  Temp(Src) 98.2 F (36.8 C) (Oral)  Resp 18  Ht 5\' 7"  (1.702 m)  Wt 177 lb 11.2 oz (80.604 kg)  BMI 27.83 kg/m2  SpO2 97%  Physical Exam pt awake/alert; abd dist; RLQ drain intact, insertion site ok,NT, output 60 cc's blood tinged fluid ; cx's pend  Imaging: Ct Abdomen Pelvis W Contrast  05/13/2015   CLINICAL DATA:  Right lower quadrant pain. History of gist tumor. Nausea, abdominal distention and diarrhea.  EXAM: CT ABDOMEN AND PELVIS WITH CONTRAST  TECHNIQUE: Multidetector CT imaging of the abdomen and pelvis was performed using the standard protocol following bolus administration of intravenous contrast.  CONTRAST:  19mL OMNIPAQUE IOHEXOL 300 MG/ML  SOLN  COMPARISON:  05/05/2015  FINDINGS: Lower chest: There are bilateral pleural effusions left greater night. New from previous exam. Overlying compressive type atelectasis/consolidation  noted.  Hepatobiliary: No focal liver abnormality identified. The gallbladder is unremarkable. There is no biliary dilatation.  Pancreas: Negative  Spleen: Normal appearance of the spleen.  Adrenals/Urinary Tract: Normal adrenal glands. Normal appearance of the kidneys. Urinary bladder is collapsed chronic Foley catheter balloon.  Stomach/Bowel: Small hiatal hernia. The stomach is otherwise unremarkable. Mild increase caliber of the small bowel loops which measure up to 3.7 cm. There are areas of small bowel wall thickening noted, image number 43/series 2 Enteric contrast material is identified within the small bowel loops as well as the colon up to the level of the rectum. There is a large right inguinal hernia which contains fluid and a nonobstructed loop of small bowel.  Vascular/Lymphatic: Calcified atherosclerotic disease involves the abdominal aorta. No aneurysm. No retroperitoneal adenopathy identified. There is no pelvic or inguinal adenopathy.  Reproductive: Prostate gland and seminal vesicles are unremarkable.  Other: Within the right lower quadrant of the abdomen in the area of the previously noted distended tubular structure there is a multi loculated cystic mass which measures 6.4 x 6.2 by 4.0 cm. Again noted is abdominal and pelvic ascites. On today's study this appears more loculated. For example, along the inferior surface of the right lobe of liver there is loculated fluid, image number 38/series 2.  Musculoskeletal: No aggressive lytic or sclerotic bone lesions.  IMPRESSION: 1. Multi loculated cystic mass is identified within the right lower quadrant of the abdomen and is new from previous exam. This is worrisome for abscess which may be the sequelae  of a perforated appendix. 2. There is a persistent ascites within the abdomen and pelvis which now appears more loculated and suggestive of peritonitis. 3. Increase caliber of small bowel loops worrisome for enteritis and ileus. Additionally, there is  a right inguinal hernia which contains a loop of small bowel. This could be partially obstructed. There is no evidence for high-grade bowel obstruction however. 4. Critical Value/emergent results were called by telephone at the time of interpretation on 05/13/2015 at 1:10 pm to Dr. [name] , who verbally acknowledged these results.   Electronically Signed   By: Kerby Moors M.D.   On: 05/13/2015 13:13   Ct Image Guided Drainage By Percutaneous Catheter  05/13/2015   CLINICAL DATA:  Right lower quadrant perforated appendicitis with abscess  EXAM: CT GUIDED DRAINAGE OF RIGHT LOWER QUADRANT APPENDICEAL ABSCESS  ANESTHESIA/SEDATION: 1.0 Mg IV Versed 25 mcg IV Fentanyl  Total Moderate Sedation Time:  15 minutes  PROCEDURE: The procedure, risks, benefits, and alternatives were explained to the patient. Questions regarding the procedure were encouraged and answered. The patient understands and consents to the procedure.  The right lower quadrant was prepped with Betadinein a sterile fashion, and a sterile drape was applied covering the operative field. A sterile gown and sterile gloves were used for the procedure. Local anesthesia was provided with 1% Lidocaine.  Previous imaging reviewed. Patient positioned supine. Noncontrast localization CT performed through the lower abdomen. The right lower quadrant loculated abscess was localized. Under sterile conditions and local anesthesia, an 18 gauge 10 cm access needle was advanced from a lateral approach into the collection. Syringe aspiration yielded purulent fluid compatible with abscess. Amplatz guidewire inserted followed by tract dilatation to advance a 10 [place] drain. Retention loop formed in the abscess cavity and confirmed with CT. Syringe aspiration yielded 23 cc bloody purulent fluid. Sample sent for Gram stain and culture. Catheter secured with a Prolene suture and connected to external suction bulb. Sterile dressing applied.  COMPLICATIONS: None immediate   FINDINGS: Imaging confirms needle placement in the right lower quadrant loculated abscess for drainage  IMPRESSION: Successful CT-guided right lower quadrant appendiceal abscess drain insertion.   Electronically Signed   By: Jerilynn Mages.  Shick M.D.   On: 05/13/2015 16:23    Labs:  CBC:  Recent Labs  05/08/15 0404 05/10/15 0537 05/11/15 0546 05/14/15 0430  WBC 11.0* 9.9 10.0 12.6*  HGB 9.4* 8.9* 9.3* 8.7*  HCT 27.7* 27.0* 27.5* 26.1*  PLT 161 189 230 379    COAGS:  Recent Labs  10/16/14 1626 10/21/14 0820 01/13/15 0100 05/06/15 0304  INR 1.09 1.07 1.05 1.14  APTT  --  29  --   --     BMP:  Recent Labs  05/08/15 0404 05/10/15 0537 05/11/15 0546 05/14/15 0430  NA 140 143 141 140  K 4.1 4.2 4.1 4.4  CL 108 113* 110 111  CO2 24 23 24 23   GLUCOSE 144* 113* 114* 99  BUN 32* 28* 28* 22*  CALCIUM 8.1* 8.6* 8.3* 7.6*  CREATININE 1.17 1.19 1.18 1.08  GFRNONAA 55* 54* 55* >60  GFRAA >60 >60 >60 >60    LIVER FUNCTION TESTS:  Recent Labs  03/11/15 0907 04/09/15 0900 05/05/15 2137 05/14/15 0430  BILITOT 0.90 0.55 1.0 0.9  AST 24 26 26 20   ALT 34 34 27 23  ALKPHOS 98 79 84 69  PROT 6.5 6.3* 7.1 5.2*  ALBUMIN 3.5 3.6 4.1 2.1*    Assessment and Plan:  S/p drainage of RLQ  abscess 5/25; cont current tx, check final cx's, monitor labs; check f/u CT once output declines; will check fluid cytology  Signed: D. Rowe Robert 05/15/2015, 9:48 AM   I spent a total of 15 minutes in face to face in clinical consultation/evaluation, greater than 50% of which was counseling/coordinating care for RLQ abscess drainage

## 2015-05-15 NOTE — Progress Notes (Signed)
[facility] Progress Note     Subjective: Much less pain overall.  Having good bowel function both in BMs and flatus.  His abdomen is less distended.  He's ambulating well.  He c/o scrotal swelling, but no pain or redness.  He has a foley catheter.    Objective: Vital signs in last 24 hours: Temp:  [98 F (36.7 C)-98.2 F (36.8 C)] 98.2 F (36.8 C) (05/27 0459) Pulse Rate:  [83-91] 91 (05/27 0459) Resp:  [18-20] 18 (05/26 2055) BP: (127-136)/(49-54) 127/54 mmHg (05/27 0459) SpO2:  [97 %-98 %] 97 % (05/27 0459) Weight:  [80.604 kg (177 lb 11.2 oz)] 80.604 kg (177 lb 11.2 oz) (05/27 0459) Last BM Date: 05/13/15  Intake/Output from previous day: 05/26 0701 - 05/27 0700 In: 3605 [P.O.:840; I.V.:2700; IV Piggyback:50] Out: 59 [Urine:1100; Drains:60; Stool:2] Intake/Output this shift:    PE: Gen:  Alert, NAD, pleasant Abd: Soft, much less distended, hard in the RLQ, minimal tenderness, +BS, no HSM, abdominal scars noted, perc drain in RLQ with yellow clear fluid with brownish red cloudy tissue matter   Lab Results:   Recent Labs  05/14/15 0430  WBC 12.6*  HGB 8.7*  HCT 26.1*  PLT 379   BMET  Recent Labs  05/14/15 0430  NA 140  K 4.4  CL 111  CO2 23  GLUCOSE 99  BUN 22*  CREATININE 1.08  CALCIUM 7.6*   PT/INR No results for input(s): LABPROT, INR in the last 72 hours. CMP     Component Value Date/Time   NA 140 05/14/2015 0430   NA 145 04/09/2015 0900   K 4.4 05/14/2015 0430   K 4.1 04/09/2015 0900   CL 111 05/14/2015 0430   CO2 23 05/14/2015 0430   CO2 27 04/09/2015 0900   GLUCOSE 99 05/14/2015 0430   GLUCOSE 116 04/09/2015 0900   BUN 22* 05/14/2015 0430   BUN 19.3 04/09/2015 0900   CREATININE 1.08 05/14/2015 0430   CREATININE 1.2 04/09/2015 0900   CREATININE 1.18 04/25/2013 1041   CALCIUM 7.6* 05/14/2015 0430   CALCIUM 8.6 04/09/2015 0900   PROT 5.2* 05/14/2015 0430   PROT 6.3* 04/09/2015 0900   ALBUMIN 2.1* 05/14/2015 0430    ALBUMIN 3.6 04/09/2015 0900   AST 20 05/14/2015 0430   AST 26 04/09/2015 0900   ALT 23 05/14/2015 0430   ALT 34 04/09/2015 0900   ALKPHOS 69 05/14/2015 0430   ALKPHOS 79 04/09/2015 0900   BILITOT 0.9 05/14/2015 0430   BILITOT 0.55 04/09/2015 0900   GFRNONAA >60 05/14/2015 0430   GFRAA >60 05/14/2015 0430   Lipase     Component Value Date/Time   LIPASE 21* 05/05/2015 2137       Studies/Results: Ct Abdomen Pelvis W Contrast  05/13/2015   CLINICAL DATA:  Right lower quadrant pain. History of gist tumor. Nausea, abdominal distention and diarrhea.  EXAM: CT ABDOMEN AND PELVIS WITH CONTRAST  TECHNIQUE: Multidetector CT imaging of the abdomen and pelvis was performed using the standard protocol following bolus administration of intravenous contrast.  CONTRAST:  150mL OMNIPAQUE IOHEXOL 300 MG/ML  SOLN  COMPARISON:  05/05/2015  FINDINGS: Lower chest: There are bilateral pleural effusions left greater night. New from previous exam. Overlying compressive type atelectasis/consolidation noted.  Hepatobiliary: No focal liver abnormality identified. The gallbladder is unremarkable. There is no biliary dilatation.  Pancreas: Negative  Spleen: Normal appearance of the spleen.  Adrenals/Urinary Tract: Normal adrenal glands. Normal appearance of the kidneys. Urinary bladder is collapsed  chronic Foley catheter balloon.  Stomach/Bowel: Small hiatal hernia. The stomach is otherwise unremarkable. Mild increase caliber of the small bowel loops which measure up to 3.7 cm. There are areas of small bowel wall thickening noted, image number 43/series 2 Enteric contrast material is identified within the small bowel loops as well as the colon up to the level of the rectum. There is a large right inguinal hernia which contains fluid and a nonobstructed loop of small bowel.  Vascular/Lymphatic: Calcified atherosclerotic disease involves the abdominal aorta. No aneurysm. No retroperitoneal adenopathy identified. There is no  pelvic or inguinal adenopathy.  Reproductive: Prostate gland and seminal vesicles are unremarkable.  Other: Within the right lower quadrant of the abdomen in the area of the previously noted distended tubular structure there is a multi loculated cystic mass which measures 6.4 x 6.2 by 4.0 cm. Again noted is abdominal and pelvic ascites. On today's study this appears more loculated. For example, along the inferior surface of the right lobe of liver there is loculated fluid, image number 38/series 2.  Musculoskeletal: No aggressive lytic or sclerotic bone lesions.  IMPRESSION: 1. Multi loculated cystic mass is identified within the right lower quadrant of the abdomen and is new from previous exam. This is worrisome for abscess which may be the sequelae of a perforated appendix. 2. There is a persistent ascites within the abdomen and pelvis which now appears more loculated and suggestive of peritonitis. 3. Increase caliber of small bowel loops worrisome for enteritis and ileus. Additionally, there is a right inguinal hernia which contains a loop of small bowel. This could be partially obstructed. There is no evidence for high-grade bowel obstruction however. 4. Critical Value/emergent results were called by telephone at the time of interpretation on 05/13/2015 at 1:10 pm to Dr. [name] , who verbally acknowledged these results.   Electronically Signed   By: Kerby Moors M.D.   On: 05/13/2015 13:13   Ct Image Guided Drainage By Percutaneous Catheter  05/13/2015   CLINICAL DATA:  Right lower quadrant perforated appendicitis with abscess  EXAM: CT GUIDED DRAINAGE OF RIGHT LOWER QUADRANT APPENDICEAL ABSCESS  ANESTHESIA/SEDATION: 1.0 Mg IV Versed 25 mcg IV Fentanyl  Total Moderate Sedation Time:  15 minutes  PROCEDURE: The procedure, risks, benefits, and alternatives were explained to the patient. Questions regarding the procedure were encouraged and answered. The patient understands and consents to the procedure.  The  right lower quadrant was prepped with Betadinein a sterile fashion, and a sterile drape was applied covering the operative field. A sterile gown and sterile gloves were used for the procedure. Local anesthesia was provided with 1% Lidocaine.  Previous imaging reviewed. Patient positioned supine. Noncontrast localization CT performed through the lower abdomen. The right lower quadrant loculated abscess was localized. Under sterile conditions and local anesthesia, an 18 gauge 10 cm access needle was advanced from a lateral approach into the collection. Syringe aspiration yielded purulent fluid compatible with abscess. Amplatz guidewire inserted followed by tract dilatation to advance a 10 [place] drain. Retention loop formed in the abscess cavity and confirmed with CT. Syringe aspiration yielded 23 cc bloody purulent fluid. Sample sent for Gram stain and culture. Catheter secured with a Prolene suture and connected to external suction bulb. Sterile dressing applied.  COMPLICATIONS: None immediate  FINDINGS: Imaging confirms needle placement in the right lower quadrant loculated abscess for drainage  IMPRESSION: Successful CT-guided right lower quadrant appendiceal abscess drain insertion.   Electronically Signed   By: Jerilynn Mages.  Shick M.D.  On: 05/13/2015 16:23    Anti-infectives: Anti-infectives    Start     Dose/Rate Route Frequency Ordered Stop   05/11/15 1400  cefTRIAXone (ROCEPHIN) 1 g in dextrose 5 % 50 mL IVPB - Premix     1 g 100 mL/hr over 30 Minutes Intravenous Every 24 hours 05/11/15 1211     05/10/15 1500  metroNIDAZOLE (FLAGYL) tablet 500 mg     500 mg Oral 3 times per day 05/10/15 1354 05/24/15 1759   05/07/15 1045  ciprofloxacin (CIPRO) IVPB 400 mg  Status:  Discontinued     400 mg 200 mL/hr over 60 Minutes Intravenous Every 12 hours 05/07/15 1032 05/07/15 1032   05/07/15 1045  metroNIDAZOLE (FLAGYL) IVPB 500 mg  Status:  Discontinued     500 mg 100 mL/hr over 60 Minutes Intravenous Every 8  hours 05/07/15 1032 05/07/15 1032   05/06/15 1400  piperacillin-tazobactam (ZOSYN) IVPB 3.375 g  Status:  Discontinued     3.375 g 12.5 mL/hr over 240 Minutes Intravenous 3 times per day 05/06/15 0535 05/11/15 1211   05/06/15 0300  vancomycin (VANCOCIN) IVPB 1000 mg/200 mL premix     1,000 mg 200 mL/hr over 60 Minutes Intravenous  Once 05/06/15 0247 05/06/15 0419   05/06/15 0245  piperacillin-tazobactam (ZOSYN) IVPB 3.375 g     3.375 g 12.5 mL/hr over 240 Minutes Intravenous  Once 05/06/15 0247 05/06/15 6283       Assessment/Plan Abdominal pain, possible appendicitis vs pain from carcinomatosis -The patient's history and symptoms seem consistent with appendicitis; however, it is possible this is all related to his carcinomatosis as well. CT scan is unequivocal.  -CT done on 05/05/15, repeat CT (05/13/15 showed RLQ abscess which is multilocular) -S/P IR perc drain, with good drainage so far (14mL/24hr), continue drain -Conservative management with bowel rest and abx therapy (Zosyn Day #10)  -Tolerating soft diet -No urgent surgical needs -IVF, pain control but limit narcotics, antiemetics -Encouraged ambulation and IS Scrotal swelling -scrotal support ?compression? 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 [name] did this patients surgery back in 06/04/2013 (Ex lap with SBR) C.diff colitis -started on flagyl PO Day #6 -question this as cause of pain originally DVT Prop -Can be on Chemical DVT proph, only currently on  SCD's (per primary service)      LOS: 9 days    Nat Christen 05/15/2015, 7:41 AM Pager: [phone]

## 2015-05-16 LAB — CULTURE, ROUTINE-ABSCESS: SPECIAL REQUESTS: NORMAL

## 2015-05-16 LAB — CBC
HEMATOCRIT: 26.4 % — AB (ref 39.0–52.0)
Hemoglobin: 8.5 g/dL — ABNORMAL LOW (ref 13.0–17.0)
MCH: 34.1 pg — ABNORMAL HIGH (ref 26.0–34.0)
MCHC: 32.2 g/dL (ref 30.0–36.0)
MCV: 106 fL — ABNORMAL HIGH (ref 78.0–100.0)
PLATELETS: 462 10*3/uL — AB (ref 150–400)
RBC: 2.49 MIL/uL — AB (ref 4.22–5.81)
RDW: 13.8 % (ref 11.5–15.5)
WBC: 10.6 10*3/uL — AB (ref 4.0–10.5)

## 2015-05-16 MED ORDER — FUROSEMIDE 10 MG/ML IJ SOLN
40.0000 mg | Freq: Once | INTRAMUSCULAR | Status: AC
  Administered 2015-05-16: 40 mg via INTRAVENOUS
  Filled 2015-05-16: qty 4

## 2015-05-16 NOTE — Progress Notes (Addendum)
Progress Note   [name] [name] IZT:[id number] DOB: 10/18/1929 DOA: 05/05/2015 PCP: Abigail Miyamoto, MD   Brief Narrative:   [name] [name] is an 81 y.o. male with h/o GIST tumor, peritoneal carcinomatosis, being treated with [place] who was admitted 05/05/15 with a chief complaint of abdominal pain associated with nausea. A CT scan, done on admission, showed possible findings consistent with appendicitis versus progressive abdominal carcinomatosis. The patient was placed on empiric Zosyn. He had significant abdominal distention and on 05/10/15 developed diarrhea with subsequent C. difficile testing positive. Flagyl was started at that time. General surgery continues to follow for possible appendicitis, which is being treated medically with antibiotics. Oncology also following.  Assessment/Plan:   Principal Problem:   Sepsis vs SIRS secondary to appendicitis versus abdominal carcinomatosis versus C. difficile colitis with associated abdominal pain - Status post vigorous hydration in the ED. Blood cultures negative to date. - IR consulted for paracentesis to rule out SBP.  Only trace ascites, so could not be done. - Initially treated with empiric zosyn for appendicitis although not entirely clear based on CT findings.   - Narrowed to Rocephin 05/11/15 given C diff +. - Repeat CT scan ordered for 05/13/15 by surgeon showed loculated fluid collection, underwent IR drain placement and bloody pus drained out. His abdominal pain is much improved after the drain was placed. Recommend continue the drain and IR is following. When the amount of drain is less than 15 ml/day, plan to follow up with a repeat CT scan.  Follow up with abscess drain cultures. Prelim reports shows multiple bacteria, final culture report pending.  - Continue pain control efforts with Dilaudid.    Active Problems:   C. Diff diarrhea - The patient developed explosive diarrhea 05/10/15. C. difficile positive.  - Continue Flagyl.  Contact  isolation. His stools are improving and are more formed.     Urinary retention, likely secondary to pain medication adverse effect versus tumor causing obstruction of urethra - Foley catheter placed 05/07/15. - Renal ultrasound negative for hydronephrosis.    Chronic anemia, multifactorial with recent ABLA, anemia of chronic disease contributory - Hemoglobin drop likely dilutional.  Baseline hemoglobin 10.9.  Current hemoglobin stable at 8.7.recommend PRBC transfusion when hemoglobin is less than 7.     Acute kidney failure, pre-renal from dehydration in the setting of stage III CKD - Renal function improved with IVF.  Baseline creatinine 1.2.  Creatinine back to usual baseline values.    GIST (gastrointestinal stromal tumor) of small bowel, malignant - Dr. [name] following, recommendations to continue Gleevac as it might recurr.     Hypertension - Continue metoprolol and Norvasc. Hydrochlorothiazide on hold.    Hyperlipidemia - Continue Zocor.    DVT Prophylaxis - SCDs ordered.  Code Status: Full. Family Communication: none at bedside Disposition Plan: pending further management. Possibly home with home PT early next week.   IV Access:    Peripheral IV   Procedures and diagnostic studies:   Ct Abdomen Pelvis Wo Contrast  05/06/2015   CLINICAL DATA:  Mid right-sided abdominal pain, onset tonight. Nausea without vomiting. History of GI stromal tumor of small bowel.  EXAM: CT ABDOMEN AND PELVIS WITHOUT CONTRAST  TECHNIQUE: Multidetector CT imaging of the abdomen and pelvis was performed following the standard protocol without IV contrast.  COMPARISON:  CT 01/13/2015  FINDINGS: Resolution of previous left pleural effusion. Depending ground-glass opacity in the right lower lobe consistent with atelectasis. The heart is enlarged.  Lack of  IV contrast limits assessment for focal hepatic lesion, allowing for this, no focal lesion is seen. Gallstones are seen within the gallbladder  which is physiologically distended. Small amount perihepatic ascites, the degree of which is slightly increased from prior exam. Spleen is normal in size, development of small volume of perisplenic ascites.  Mesenteric and omental edema and ill-defined soft tissue omental densities consistent with known peritoneal disease. Soft tissue nodule in the midline upper abdomen measures 2.8 x 2.2 cm, previously 3.5 x 2.3 cm.  Small and large bowel are suboptimally assessed given lack contrast and haziness throughout the abdomen. The stomach through is distended with fluid, small amount of fluid in the distal esophagus is small hiatal hernia. Enteric anastomosis in the mid to left abdomen. Bowel loops in the right lower quadrant of the abdomen are suboptimally defined. There is hyperattenuation within a tubular structure in the right lower quadrant that may reflect the appendix, however neoplasm is not entirely excluded. There is increased soft tissue density/fluid and edema about this structure which may reflect increased ascites. Edema and fluid tracks in the right inguinal canal with possible peritoneal implant in the inguinal canal. There are multiple colonic diverticula containing retained barium from prior exams.  No adrenal nodule. Pancreas is normal. There is no hydronephrosis, kidneys are symmetric in size. Questionable soft tissue density in the region of the porta, may reflect periportal lymph nodes.  Dense atherosclerosis of the abdominal aorta without aneurysm. Small retroperitoneal soft tissue densities.  In the pelvis the bladder is physiologically distended. Prostate gland is normal in size. Small amount pelvic ascites.  No lytic or blastic osseous lesions. Degenerative change in the spine.  IMPRESSION: 1. Increased intra-abdominal ascites in the right and left upper quadrant and right lower quadrant, the degree of which remains mild. There is increased peritoneal and omental edema of the right lower  quadrant. This edema and soft tissue density tracks into the right inguinal canal which may reflect progression of known metastatic disease, versus decompressed small bowel tracking in the right inguinal hernia. No proximal small bowel dilatation to suggest obstruction. 2. There is however interval improvement in size of dominant omental nodule in the anterior upper abdomen from prior. 3. Tubular structure in the right lower quadrant containing high-density. This high-density was seen on prior exam, it is unclear whether this represents the appendix with retained barium versus related to neoplasm or metastatic disease. There is overall increase in the fluid and soft tissue in the right lower quadrant, in addition adjacent to these tubular structure, however the appearance does not favor that of appendicitis. 4. Cholelithiasis without findings of acute cholecystitis. Diverticulosis without diverticulitis.   Electronically Signed   By: Jeb Levering M.D.   On: 05/06/2015 00:17   Dg Abd 1 View  05/10/2015   CLINICAL DATA:  Abdominal distention. 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  EXAM: ABDOMEN - 1 VIEW  COMPARISON:  Abdomen and pelvis CT dated 05/05/2015.  FINDINGS: Mildly dilated small bowel loops in the left upper abdomen. Normal caliber gas filled right and transverse colon. Unremarkable bones.  IMPRESSION: Mild proximal small bowel ileus or partial obstruction.   Electronically Signed   By: Claudie Revering M.D.   On: 05/10/2015 11:47   Ct Abdomen Pelvis W Contrast  05/13/2015   CLINICAL DATA:  Right lower quadrant pain. History of gist tumor. Nausea, abdominal distention and diarrhea.  EXAM: CT ABDOMEN AND PELVIS WITH CONTRAST  TECHNIQUE: Multidetector CT imaging of the abdomen and  pelvis was performed using the standard protocol following bolus administration of intravenous contrast.  CONTRAST:  170mL OMNIPAQUE IOHEXOL 300 MG/ML  SOLN  COMPARISON:  05/05/2015  FINDINGS: Lower chest: There are  bilateral pleural effusions left greater night. New from previous exam. Overlying compressive type atelectasis/consolidation noted.  Hepatobiliary: No focal liver abnormality identified. The gallbladder is unremarkable. There is no biliary dilatation.  Pancreas: Negative  Spleen: Normal appearance of the spleen.  Adrenals/Urinary Tract: Normal adrenal glands. Normal appearance of the kidneys. Urinary bladder is collapsed chronic Foley catheter balloon.  Stomach/Bowel: Small hiatal hernia. The stomach is otherwise unremarkable. Mild increase caliber of the small bowel loops which measure up to 3.7 cm. There are areas of small bowel wall thickening noted, image number 43/series 2 Enteric contrast material is identified within the small bowel loops as well as the colon up to the level of the rectum. There is a large right inguinal hernia which contains fluid and a nonobstructed loop of small bowel.  Vascular/Lymphatic: Calcified atherosclerotic disease involves the abdominal aorta. No aneurysm. No retroperitoneal adenopathy identified. There is no pelvic or inguinal adenopathy.  Reproductive: Prostate gland and seminal vesicles are unremarkable.  Other: Within the right lower quadrant of the abdomen in the area of the previously noted distended tubular structure there is a multi loculated cystic mass which measures 6.4 x 6.2 by 4.0 cm. Again noted is abdominal and pelvic ascites. On today's study this appears more loculated. For example, along the inferior surface of the right lobe of liver there is loculated fluid, image number 38/series 2.  Musculoskeletal: No aggressive lytic or sclerotic bone lesions.  IMPRESSION: 1. Multi loculated cystic mass is identified within the right lower quadrant of the abdomen and is new from previous exam. This is worrisome for abscess which may be the sequelae of a perforated appendix. 2. There is a persistent ascites within the abdomen and pelvis which now appears more loculated and  suggestive of peritonitis. 3. Increase caliber of small bowel loops worrisome for enteritis and ileus. Additionally, there is a right inguinal hernia which contains a loop of small bowel. This could be partially obstructed. There is no evidence for high-grade bowel obstruction however. 4. Critical Value/emergent results were called by telephone at the time of interpretation on 05/13/2015 at 1:10 pm to Dr. [name] , who verbally acknowledged these results.   Electronically Signed   By: Kerby Moors M.D.   On: 05/13/2015 13:13   US Renal  05/07/2015   CLINICAL DATA:  Urinary retention.  EXAM: RENAL / URINARY TRACT ULTRASOUND COMPLETE  COMPARISON:  CT scan abdomen dated 05/05/2015  FINDINGS: Right Kidney:  Length: 10.9 cm. Echogenicity within normal limits. No mass or hydronephrosis visualized.  Left Kidney:  Length: 11.5 cm. Echogenicity within normal limits. No mass or hydronephrosis visualized.  Bladder:  The bladder is empty with a Foley catheter in place.  IMPRESSION: Normal kidneys.  No hydronephrosis or other abnormality.   Electronically Signed   By: Lorriane Shire M.D.   On: 05/07/2015 17:27   US Abdomen Limited  05/06/2015   CLINICAL DATA:  Abdominal pain.  Evaluate for pocket of ascites.  EXAM: LIMITED ABDOMEN ULTRASOUND FOR ASCITES  TECHNIQUE: Limited ultrasound survey for ascites was performed in all four abdominal quadrants.  COMPARISON:  CT abdomen 05/05/2015.  FINDINGS: A drainable pocket of ascites was not identified in any of the 4 quadrants.  IMPRESSION: As above.   Electronically Signed   By: Michae Kava.D.  On: 05/06/2015 12:45   Ct Image Guided Drainage By Percutaneous Catheter  05/13/2015   CLINICAL DATA:  Right lower quadrant perforated appendicitis with abscess  EXAM: CT GUIDED DRAINAGE OF RIGHT LOWER QUADRANT APPENDICEAL ABSCESS  ANESTHESIA/SEDATION: 1.0 Mg IV Versed 25 mcg IV Fentanyl  Total Moderate Sedation Time:  15 minutes  PROCEDURE: The procedure, risks, benefits, and  alternatives were explained to the patient. Questions regarding the procedure were encouraged and answered. The patient understands and consents to the procedure.  The right lower quadrant was prepped with Betadinein a sterile fashion, and a sterile drape was applied covering the operative field. A sterile gown and sterile gloves were used for the procedure. Local anesthesia was provided with 1% Lidocaine.  Previous imaging reviewed. Patient positioned supine. Noncontrast localization CT performed through the lower abdomen. The right lower quadrant loculated abscess was localized. Under sterile conditions and local anesthesia, an 18 gauge 10 cm access needle was advanced from a lateral approach into the collection. Syringe aspiration yielded purulent fluid compatible with abscess. Amplatz guidewire inserted followed by tract dilatation to advance a 10 [place] drain. Retention loop formed in the abscess cavity and confirmed with CT. Syringe aspiration yielded 23 cc bloody purulent fluid. Sample sent for Gram stain and culture. Catheter secured with a Prolene suture and connected to external suction bulb. Sterile dressing applied.  COMPLICATIONS: None immediate  FINDINGS: Imaging confirms needle placement in the right lower quadrant loculated abscess for drainage  IMPRESSION: Successful CT-guided right lower quadrant appendiceal abscess drain insertion.   Electronically Signed   By: Jerilynn Mages.  Shick M.D.   On: 05/13/2015 16:23     Medical Consultants:    Oncology  Surgery  Anti-Infectives:    Zosyn 05/05/15--->05/11/15  Rocephin 05/11/15--->  Flagyl 05/10/15--->  Subjective:   [name] [name]  No nausea or vomiting. Abdominal pain improved. Cheerful, family around. REPORTS rigth eye is itchy.   Objective:    Filed Vitals:   05/15/15 1300 05/15/15 2046 05/16/15 0628 05/16/15 1350  BP: 146/59 130/50 141/50 117/46  Pulse: 91 89 83 72  Temp: 98.4 F (36.9 C) 98.6 F (37 C) 99 F (37.2 C) 98.9 F (37.2 C)   TempSrc: Oral Oral Oral Oral  Resp: 20 20 20 22   Height:      Weight:   82.736 kg (182 lb 6.4 oz)   SpO2: 99% 97% 97% 95%    Intake/Output Summary (Last 24 hours) at 05/16/15 1713 Last data filed at 05/16/15 1600  Gross per 24 hour  Intake   2121 ml  Output   1820 ml  Net    301 ml    Exam: Gen:  NAD Cardiovascular:  Tachy, No M/R/G Respiratory:  Lungs CTAB Gastrointestinal:  Abdomen distended,no tenderness. , +BS Extremities: upper extremity swelling much improved.    Data Reviewed:    Labs: Basic Metabolic Panel:  Recent Labs Lab 05/10/15 0537 05/11/15 0546 05/14/15 0430  NA 143 141 140  K 4.2 4.1 4.4  CL 113* 110 111  CO2 23 24 23   GLUCOSE 113* 114* 99  BUN 28* 28* 22*  CREATININE 1.19 1.18 1.08  CALCIUM 8.6* 8.3* 7.6*   GFR Estimated Creatinine Clearance: 52.4 mL/min (by C-G formula based on Cr of 1.08). Liver Function Tests:  Recent Labs Lab 05/14/15 0430  AST 20  ALT 23  ALKPHOS 69  BILITOT 0.9  PROT 5.2*  ALBUMIN 2.1*   No results for input(s): LIPASE, AMYLASE in the last 168 hours. Coagulation profile No  results for input(s): INR, PROTIME in the last 168 hours.  CBC:  Recent Labs Lab 05/10/15 0537 05/11/15 0546 05/14/15 0430 05/16/15 0410  WBC 9.9 10.0 12.6* 10.6*  HGB 8.9* 9.3* 8.7* 8.5*  HCT 27.0* 27.5* 26.1* 26.4*  MCV 104.7* 103.8* 104.4* 106.0*  PLT 189 230 379 462*   BNP (last 3 results)  Recent Labs  09/12/14 0445  PROBNP 5726.0*   Sepsis Labs:  Recent Labs Lab 05/10/15 0537 05/11/15 0546 05/14/15 0430 05/16/15 0410  WBC 9.9 10.0 12.6* 10.6*   Microbiology Recent Results (from the past 240 hour(s))  Clostridium Difficile by PCR     Status: Abnormal   Collection Time: 05/10/15 11:00 AM  Result Value Ref Range Status   C difficile by pcr POSITIVE (A) NEGATIVE Final    Comment: CRITICAL RESULT CALLED TO, READ BACK BY AND VERIFIED WITH: I HABIB AT 1340 ON 05.22.2016 BY NBROOKS   Culture, routine-abscess      Status: None   Collection Time: 05/13/15  5:08 PM  Result Value Ref Range Status   Specimen Description ABSCESS  Final   Special Requests Normal  Final   Gram Stain   Final    ABUNDANT WBC PRESENT, PREDOMINANTLY PMN NO SQUAMOUS EPITHELIAL CELLS SEEN RARE GRAM POSITIVE COCCI IN PAIRS Performed at [facility]    Culture   Final    MULTIPLE ORGANISMS PRESENT, NONE PREDOMINANT Note: NO STAPHYLOCOCCUS AUREUS ISOLATED NO GROUP A STREP (S.PYOGENES) ISOLATED Performed at [facility]    Report Status 05/16/2015 FINAL  Final  Anaerobic culture     Status: None (Preliminary result)   Collection Time: 05/13/15  5:09 PM  Result Value Ref Range Status   Specimen Description ABSCESS  Final   Special Requests Normal  Final   Gram Stain   Final    ABUNDANT WBC PRESENT, PREDOMINANTLY PMN NO SQUAMOUS EPITHELIAL CELLS SEEN RARE GRAM POSITIVE COCCI IN PAIRS Performed at [facility]    Culture   Final    NO ANAEROBES ISOLATED; CULTURE IN PROGRESS FOR 5 DAYS Performed at [facility]    Report Status PENDING  Incomplete     Medications:   . amLODipine  5 mg Oral Daily  . cefTRIAXone (ROCEPHIN)  IV  1 g Intravenous Q24H  . imatinib  400 mg Oral Q breakfast  . metoprolol tartrate  25 mg Oral BID  . metroNIDAZOLE  500 mg Oral 3 times per day  . saccharomyces boulardii  250 mg Oral BID  . simvastatin  20 mg Oral QPM  . sodium chloride  3 mL Intravenous Q12H   Continuous Infusions: . 0.9 % NaCl with KCl 40 mEq / L 10 mL/hr (05/16/15 0909)    Time spent: 25 minutes.   LOS: 10 days   [facility] Pager [phone]   If 7PM-7AM, please contact night-coverage at [email or web address], password [email or web address] for any overnight needs.  05/16/2015, 5:13 PM

## 2015-05-16 NOTE — Progress Notes (Signed)
Patient ID: [name] [name], male   DOB: Apr 28, 1929, 81 y.o.   MRN: [id number]    Subjective: Overall feels steadily better since drain placement. No significant abdominal pain. Has been up walking. He states he ate well and had a good bowel movement. He is concerned about some increased swelling in his scrotum.  Objective: Vital signs in last 24 hours: Temp:  [98.4 F (36.9 C)-99 F (37.2 C)] 99 F (37.2 C) (05/28 0628) Pulse Rate:  [83-91] 83 (05/28 0628) Resp:  [20] 20 (05/28 0628) BP: (130-146)/(50-59) 141/50 mmHg (05/28 0628) SpO2:  [97 %-99 %] 97 % (05/28 0628) Weight:  [82.736 kg (182 lb 6.4 oz)] 82.736 kg (182 lb 6.4 oz) (05/28 0628) Last BM Date: 05/15/15  Intake/Output from previous day: 05/27 0701 - 05/28 0700 In: 2290 [P.O.:570; I.V.:1650; IV Piggyback:50] Out: 75 [Urine:1000; Drains:20] Intake/Output this shift: Total I/O In: 240 [P.O.:240] Out: -   General appearance: alert, cooperative and no distress GI: moderately distended but nontender. Drainage catheter in right lower abdomen with cloudy blood-tinged drainage Male genitalia: normal, scrotum very swollen, nontender and not erythematous  Lab Results:   Recent Labs  05/14/15 0430 05/16/15 0410  WBC 12.6* 10.6*  HGB 8.7* 8.5*  HCT 26.1* 26.4*  PLT 379 462*   BMET  Recent Labs  05/14/15 0430  NA 140  K 4.4  CL 111  CO2 23  GLUCOSE 99  BUN 22*  CREATININE 1.08  CALCIUM 7.6*     Studies/Results: Recent Results (from the past 240 hour(s))  Clostridium Difficile by PCR     Status: Abnormal   Collection Time: 05/10/15 11:00 AM  Result Value Ref Range Status   C difficile by pcr POSITIVE (A) NEGATIVE Final    Comment: CRITICAL RESULT CALLED TO, READ BACK BY AND VERIFIED WITH: I HABIB AT 1340 ON 05.22.2016 BY NBROOKS   Culture, routine-abscess     Status: None   Collection Time: 05/13/15  5:08 PM  Result Value Ref Range Status   Specimen Description ABSCESS  Final   Special Requests Normal   Final   Gram Stain   Final    ABUNDANT WBC PRESENT, PREDOMINANTLY PMN NO SQUAMOUS EPITHELIAL CELLS SEEN RARE GRAM POSITIVE COCCI IN PAIRS Performed at [facility]    Culture   Final    MULTIPLE ORGANISMS PRESENT, NONE PREDOMINANT Note: NO STAPHYLOCOCCUS AUREUS ISOLATED NO GROUP A STREP (S.PYOGENES) ISOLATED Performed at [facility]    Report Status 05/16/2015 FINAL  Final  Anaerobic culture     Status: None (Preliminary result)   Collection Time: 05/13/15  5:09 PM  Result Value Ref Range Status   Specimen Description ABSCESS  Final   Special Requests Normal  Final   Gram Stain   Final    ABUNDANT WBC PRESENT, PREDOMINANTLY PMN NO SQUAMOUS EPITHELIAL CELLS SEEN RARE GRAM POSITIVE COCCI IN PAIRS Performed at [facility]    Culture   Final    NO ANAEROBES ISOLATED; CULTURE IN PROGRESS FOR 5 DAYS Performed at [facility]    Report Status PENDING  Incomplete     Anti-infectives: Anti-infectives    Start     Dose/Rate Route Frequency Ordered Stop   05/11/15 1400  cefTRIAXone (ROCEPHIN) 1 g in dextrose 5 % 50 mL IVPB - Premix     1 g 100 mL/hr over 30 Minutes Intravenous Every 24 hours 05/11/15 1211     05/10/15 1500  metroNIDAZOLE (FLAGYL) tablet 500 mg  500 mg Oral 3 times per day 05/10/15 1354 05/24/15 1759   05/07/15 1045  ciprofloxacin (CIPRO) IVPB 400 mg  Status:  Discontinued     400 mg 200 mL/hr over 60 Minutes Intravenous Every 12 hours 05/07/15 1032 05/07/15 1032   05/07/15 1045  metroNIDAZOLE (FLAGYL) IVPB 500 mg  Status:  Discontinued     500 mg 100 mL/hr over 60 Minutes Intravenous Every 8 hours 05/07/15 1032 05/07/15 1032   05/06/15 1400  piperacillin-tazobactam (ZOSYN) IVPB 3.375 g  Status:  Discontinued     3.375 g 12.5 mL/hr over 240 Minutes Intravenous 3 times per day 05/06/15 0535 05/11/15 1211   05/06/15 0300  vancomycin (VANCOCIN) IVPB 1000 mg/200 mL premix     1,000 mg 200 mL/hr over 60 Minutes Intravenous   Once 05/06/15 0247 05/06/15 0419   05/06/15 0245  piperacillin-tazobactam (ZOSYN) IVPB 3.375 g     3.375 g 12.5 mL/hr over 240 Minutes Intravenous  Once 05/06/15 0247 05/06/15 1610      Assessment/Plan: 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 Developed right lower quadrant abscess, status post percutaneous drain. C. Difficile colitis Overall improved with antibiotics and percutaneous drain. Pain and signs of infection resolving. Plan to discharge home on oral antibiotics and drain in place Increased scrotal swelling. Fluid balance markedly positive. Will give a single dose of Lasix and reduce IV fluids.    LOS: 10 days    Curtistine Pettitt T 05/16/2015

## 2015-05-16 NOTE — Progress Notes (Signed)
Referring Physician(s): CCS  Subjective: Patient denies any abdominal pain and feeling better, states his BM are more formed.   Allergies: Review of patient's allergies indicates no known allergies.  Medications: Prior to Admission medications   Medication Sig Start Date End Date Taking? Authorizing Provider  amLODipine (NORVASC) 5 MG tablet Take 5 mg by mouth daily. 03/17/15  Yes Historical Provider, MD  hydrochlorothiazide (HYDRODIURIL) 25 MG tablet Take 25 mg by mouth daily.   Yes Historical Provider, MD  imatinib (GLEEVEC) 400 MG tablet Take 1 tablet (400 mg total) by mouth daily. Take with meals and large glass of water.Caution:Chemotherapy. 02/13/15  Yes Ladell Pier, MD  metoprolol tartrate (LOPRESSOR) 25 MG tablet Take 1 tablet (25 mg total) by mouth 2 (two) times daily. 03/06/13  Yes Costin Karlyne Greenspan, MD  simvastatin (ZOCOR) 20 MG tablet Take 20 mg by mouth every evening.   Yes Historical Provider, MD    Vital Signs: BP 141/50 mmHg  Pulse 83  Temp(Src) 99 F (37.2 C) (Oral)  Resp 20  Ht 5\' 7"  (1.702 m)  Wt 182 lb 6.4 oz (82.736 kg)  BMI 28.56 kg/m2  SpO2 97%  Physical Exam General: A&Ox3, NAD Abd: Distended, NT, RLQ drain intact bloody purulent output, 20 cc last 24 hours (60cc) Cx gram (+) cocci in pairs/pending  Imaging: Ct Abdomen Pelvis W Contrast  05/13/2015   CLINICAL DATA:  Right lower quadrant pain. History of gist tumor. Nausea, abdominal distention and diarrhea.  EXAM: CT ABDOMEN AND PELVIS WITH CONTRAST  TECHNIQUE: Multidetector CT imaging of the abdomen and pelvis was performed using the standard protocol following bolus administration of intravenous contrast.  CONTRAST:  167mL OMNIPAQUE IOHEXOL 300 MG/ML  SOLN  COMPARISON:  05/05/2015  FINDINGS: Lower chest: There are bilateral pleural effusions left greater night. New from previous exam. Overlying compressive type atelectasis/consolidation noted.  Hepatobiliary: No focal liver abnormality identified.  The gallbladder is unremarkable. There is no biliary dilatation.  Pancreas: Negative  Spleen: Normal appearance of the spleen.  Adrenals/Urinary Tract: Normal adrenal glands. Normal appearance of the kidneys. Urinary bladder is collapsed chronic Foley catheter balloon.  Stomach/Bowel: Small hiatal hernia. The stomach is otherwise unremarkable. Mild increase caliber of the small bowel loops which measure up to 3.7 cm. There are areas of small bowel wall thickening noted, image number 43/series 2 Enteric contrast material is identified within the small bowel loops as well as the colon up to the level of the rectum. There is a large right inguinal hernia which contains fluid and a nonobstructed loop of small bowel.  Vascular/Lymphatic: Calcified atherosclerotic disease involves the abdominal aorta. No aneurysm. No retroperitoneal adenopathy identified. There is no pelvic or inguinal adenopathy.  Reproductive: Prostate gland and seminal vesicles are unremarkable.  Other: Within the right lower quadrant of the abdomen in the area of the previously noted distended tubular structure there is a multi loculated cystic mass which measures 6.4 x 6.2 by 4.0 cm. Again noted is abdominal and pelvic ascites. On today's study this appears more loculated. For example, along the inferior surface of the right lobe of liver there is loculated fluid, image number 38/series 2.  Musculoskeletal: No aggressive lytic or sclerotic bone lesions.  IMPRESSION: 1. Multi loculated cystic mass is identified within the right lower quadrant of the abdomen and is new from previous exam. This is worrisome for abscess which may be the sequelae of a perforated appendix. 2. There is a persistent ascites within the abdomen and pelvis  which now appears more loculated and suggestive of peritonitis. 3. Increase caliber of small bowel loops worrisome for enteritis and ileus. Additionally, there is a right inguinal hernia which contains a loop of small bowel.  This could be partially obstructed. There is no evidence for high-grade bowel obstruction however. 4. Critical Value/emergent results were called by telephone at the time of interpretation on 05/13/2015 at 1:10 pm to Dr. [name] , who verbally acknowledged these results.   Electronically Signed   By: Kerby Moors M.D.   On: 05/13/2015 13:13   Ct Image Guided Drainage By Percutaneous Catheter  05/13/2015   CLINICAL DATA:  Right lower quadrant perforated appendicitis with abscess  EXAM: CT GUIDED DRAINAGE OF RIGHT LOWER QUADRANT APPENDICEAL ABSCESS  ANESTHESIA/SEDATION: 1.0 Mg IV Versed 25 mcg IV Fentanyl  Total Moderate Sedation Time:  15 minutes  PROCEDURE: The procedure, risks, benefits, and alternatives were explained to the patient. Questions regarding the procedure were encouraged and answered. The patient understands and consents to the procedure.  The right lower quadrant was prepped with Betadinein a sterile fashion, and a sterile drape was applied covering the operative field. A sterile gown and sterile gloves were used for the procedure. Local anesthesia was provided with 1% Lidocaine.  Previous imaging reviewed. Patient positioned supine. Noncontrast localization CT performed through the lower abdomen. The right lower quadrant loculated abscess was localized. Under sterile conditions and local anesthesia, an 18 gauge 10 cm access needle was advanced from a lateral approach into the collection. Syringe aspiration yielded purulent fluid compatible with abscess. Amplatz guidewire inserted followed by tract dilatation to advance a 10 [place] drain. Retention loop formed in the abscess cavity and confirmed with CT. Syringe aspiration yielded 23 cc bloody purulent fluid. Sample sent for Gram stain and culture. Catheter secured with a Prolene suture and connected to external suction bulb. Sterile dressing applied.  COMPLICATIONS: None immediate  FINDINGS: Imaging confirms needle placement in the right lower  quadrant loculated abscess for drainage  IMPRESSION: Successful CT-guided right lower quadrant appendiceal abscess drain insertion.   Electronically Signed   By: Jerilynn Mages.  Shick M.D.   On: 05/13/2015 16:23    Labs:  CBC:  Recent Labs  05/10/15 0537 05/11/15 0546 05/14/15 0430 05/16/15 0410  WBC 9.9 10.0 12.6* 10.6*  HGB 8.9* 9.3* 8.7* 8.5*  HCT 27.0* 27.5* 26.1* 26.4*  PLT 189 230 379 462*    COAGS:  Recent Labs  10/16/14 1626 10/21/14 0820 01/13/15 0100 05/06/15 0304  INR 1.09 1.07 1.05 1.14  APTT  --  29  --   --     BMP:  Recent Labs  05/08/15 0404 05/10/15 0537 05/11/15 0546 05/14/15 0430  NA 140 143 141 140  K 4.1 4.2 4.1 4.4  CL 108 113* 110 111  CO2 24 23 24 23   GLUCOSE 144* 113* 114* 99  BUN 32* 28* 28* 22*  CALCIUM 8.1* 8.6* 8.3* 7.6*  CREATININE 1.17 1.19 1.18 1.08  GFRNONAA 55* 54* 55* >60  GFRAA >60 >60 >60 >60    LIVER FUNCTION TESTS:  Recent Labs  03/11/15 0907 04/09/15 0900 05/05/15 2137 05/14/15 0430  BILITOT 0.90 0.55 1.0 0.9  AST 24 26 26 20   ALT 34 34 27 23  ALKPHOS 98 79 84 69  PROT 6.5 6.3* 7.1 5.2*  ALBUMIN 3.5 3.6 4.1 2.1*    Assessment and Plan: RLQ abscess secondary to perforated appendicitis ? GIST tumor?  S/p perc drain 5/25 with bloody purulent output 20cc, follow  Cx-rare gram (+) cocci in pairs, wbc trending down 10.6 (12.6), continue flushes and monitor daily output, will need repeat imaging once output < 15cc/24hrs or if clinical status changes.  Plans per CCS/[facility] C. Diff, being treated and loose stools improving.  History of GIST tumor    Signed: Hedy Jacob 05/16/2015, 9:57 AM   I spent a total of 15 Minutes in face to face in clinical consultation/evaluation, greater than 50% of which was counseling/coordinating care for RLQ abscess

## 2015-05-17 ENCOUNTER — Inpatient Hospital Stay (HOSPITAL_COMMUNITY): Payer: Medicare Other

## 2015-05-17 LAB — CBC
HCT: 25.7 % — ABNORMAL LOW (ref 39.0–52.0)
Hemoglobin: 8.3 g/dL — ABNORMAL LOW (ref 13.0–17.0)
MCH: 33.5 pg (ref 26.0–34.0)
MCHC: 32.3 g/dL (ref 30.0–36.0)
MCV: 103.6 fL — ABNORMAL HIGH (ref 78.0–100.0)
Platelets: 467 10*3/uL — ABNORMAL HIGH (ref 150–400)
RBC: 2.48 MIL/uL — ABNORMAL LOW (ref 4.22–5.81)
RDW: 13.6 % (ref 11.5–15.5)
WBC: 9.5 10*3/uL (ref 4.0–10.5)

## 2015-05-17 LAB — BASIC METABOLIC PANEL
ANION GAP: 6 (ref 5–15)
BUN: 16 mg/dL (ref 6–20)
CO2: 23 mmol/L (ref 22–32)
CREATININE: 1.04 mg/dL (ref 0.61–1.24)
Calcium: 7.5 mg/dL — ABNORMAL LOW (ref 8.9–10.3)
Chloride: 110 mmol/L (ref 101–111)
GFR calc non Af Amer: >60 mL/min (ref >60)
GLUCOSE: 107 mg/dL — AB (ref 65–99)
Potassium: 3.7 mmol/L (ref 3.5–5.1)
Sodium: 139 mmol/L (ref 135–145)

## 2015-05-17 MED ORDER — FUROSEMIDE 10 MG/ML IJ SOLN
40.0000 mg | Freq: Once | INTRAMUSCULAR | Status: AC
  Administered 2015-05-17: 40 mg via INTRAVENOUS
  Filled 2015-05-17: qty 4

## 2015-05-17 NOTE — Progress Notes (Signed)
Progress Note   [name] [name] OZH:[id number] DOB: 1929-02-25 DOA: 05/05/2015 PCP: Abigail Miyamoto, MD   Brief Narrative:   [name] [name] is an 81 y.o. male with h/o GIST tumor, peritoneal carcinomatosis, being treated with [place] who was admitted 05/05/15 with a chief complaint of abdominal pain associated with nausea. A CT scan, done on admission, showed possible findings consistent with appendicitis versus progressive abdominal carcinomatosis. The patient was placed on empiric Zosyn. He had significant abdominal distention and on 05/10/15 developed diarrhea with subsequent C. difficile testing positive. Flagyl was started at that time. General surgery continues to follow for possible appendicitis, which is being treated medically with antibiotics. Oncology also following. Repeat CT scan ordered for 05/13/15 by surgeon showed loculated fluid collection, underwent IR drain placement and bloody pus drained out. His abdominal pain is much improved after the drain was placed. Recommend continue the drain and IR is following. When the amount of drain is less than 15 ml/day, plan to follow up with a repeat CT scan.  Follow up with abscess drain cultures.  Assessment/Plan:   Principal Problem:   Sepsis vs SIRS secondary to appendicitis versus abdominal carcinomatosis versus C. difficile colitis with associated abdominal pain - Status post vigorous hydration in the ED. Blood cultures negative to date. - IR consulted for paracentesis to rule out SBP.  Only trace ascites, so could not be done. - Initially treated with empiric zosyn for appendicitis although not entirely clear based on CT findings.   - Narrowed to Rocephin 05/11/15 given C diff +. - Repeat CT scan ordered for 05/13/15 by surgeon showed loculated fluid collection, underwent IR drain placement and bloody pus drained out. His abdominal pain is much improved after the drain was placed. Recommend continue the drain and IR is following. When the amount  of drain is less than 15 ml/day, plan to follow up with a repeat CT scan.  Follow up with abscess drain cultures. Prelim reports shows multiple bacteria, final culture report pending.  - Continue pain control efforts with Dilaudid.    Active Problems:   C. Diff diarrhea - The patient developed explosive diarrhea 05/10/15. C. difficile positive.  - Continue Flagyl.  Contact isolation.- patient reports loose BM again last night and earlier this am.     Urinary retention, likely secondary to pain medication adverse effect versus tumor causing obstruction of urethra - Foley catheter placed 05/07/15. - Renal ultrasound negative for hydronephrosis.    Chronic anemia, multifactorial with recent ABLA, anemia of chronic disease contributory - Hemoglobin drop likely dilutional.  Baseline hemoglobin 10.9.  Current hemoglobin stable at 8.3.recommend PRBC transfusion when hemoglobin is less than 7.     Acute kidney failure, pre-renal from dehydration in the setting of stage III CKD - Renal function improved with IVF.  Baseline creatinine 1.2.  Creatinine back to usual baseline values.    GIST (gastrointestinal stromal tumor) of small bowel, malignant - Dr. [name] following, recommendations to continue Gleevac as it might recurr.     Hypertension - Continue metoprolol and Norvasc. Hydrochlorothiazide on hold.    Hyperlipidemia - Continue Zocor.  Fluid overload, scrotal swelling and pedal edema: getting IV lasix as needed.  Get srotal [place] for further evaluation.   Right eye redness:  Probably allergic in nature, reports some itching, which has resolved.     DVT Prophylaxis - SCDs ordered.  Code Status: Full. Family Communication: none at bedside Disposition Plan: pending further management. Possibly home with home PT early  next week.   IV Access:    Peripheral IV   Procedures and diagnostic studies:   Ct Abdomen Pelvis Wo Contrast  05/06/2015   CLINICAL DATA:  Mid right-sided  abdominal pain, onset tonight. Nausea without vomiting. History of GI stromal tumor of small bowel.  EXAM: CT ABDOMEN AND PELVIS WITHOUT CONTRAST  TECHNIQUE: Multidetector CT imaging of the abdomen and pelvis was performed following the standard protocol without IV contrast.  COMPARISON:  CT 01/13/2015  FINDINGS: Resolution of previous left pleural effusion. Depending ground-glass opacity in the right lower lobe consistent with atelectasis. The heart is enlarged.  Lack of IV contrast limits assessment for focal hepatic lesion, allowing for this, no focal lesion is seen. Gallstones are seen within the gallbladder which is physiologically distended. Small amount perihepatic ascites, the degree of which is slightly increased from prior exam. Spleen is normal in size, development of small volume of perisplenic ascites.  Mesenteric and omental edema and ill-defined soft tissue omental densities consistent with known peritoneal disease. Soft tissue nodule in the midline upper abdomen measures 2.8 x 2.2 cm, previously 3.5 x 2.3 cm.  Small and large bowel are suboptimally assessed given lack contrast and haziness throughout the abdomen. The stomach through is distended with fluid, small amount of fluid in the distal esophagus is small hiatal hernia. Enteric anastomosis in the mid to left abdomen. Bowel loops in the right lower quadrant of the abdomen are suboptimally defined. There is hyperattenuation within a tubular structure in the right lower quadrant that may reflect the appendix, however neoplasm is not entirely excluded. There is increased soft tissue density/fluid and edema about this structure which may reflect increased ascites. Edema and fluid tracks in the right inguinal canal with possible peritoneal implant in the inguinal canal. There are multiple colonic diverticula containing retained barium from prior exams.  No adrenal nodule. Pancreas is normal. There is no hydronephrosis, kidneys are symmetric in size.  Questionable soft tissue density in the region of the porta, may reflect periportal lymph nodes.  Dense atherosclerosis of the abdominal aorta without aneurysm. Small retroperitoneal soft tissue densities.  In the pelvis the bladder is physiologically distended. Prostate gland is normal in size. Small amount pelvic ascites.  No lytic or blastic osseous lesions. Degenerative change in the spine.  IMPRESSION: 1. Increased intra-abdominal ascites in the right and left upper quadrant and right lower quadrant, the degree of which remains mild. There is increased peritoneal and omental edema of the right lower quadrant. This edema and soft tissue density tracks into the right inguinal canal which may reflect progression of known metastatic disease, versus decompressed small bowel tracking in the right inguinal hernia. No proximal small bowel dilatation to suggest obstruction. 2. There is however interval improvement in size of dominant omental nodule in the anterior upper abdomen from prior. 3. Tubular structure in the right lower quadrant containing high-density. This high-density was seen on prior exam, it is unclear whether this represents the appendix with retained barium versus related to neoplasm or metastatic disease. There is overall increase in the fluid and soft tissue in the right lower quadrant, in addition adjacent to these tubular structure, however the appearance does not favor that of appendicitis. 4. Cholelithiasis without findings of acute cholecystitis. Diverticulosis without diverticulitis.   Electronically Signed   By: Jeb Levering M.D.   On: 05/06/2015 00:17   Dg Abd 1 View  05/10/2015   CLINICAL DATA:  Abdominal distention. 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  EXAM: ABDOMEN - 1 VIEW  COMPARISON:  Abdomen and pelvis CT dated 05/05/2015.  FINDINGS: Mildly dilated small bowel loops in the left upper abdomen. Normal caliber gas filled right and transverse colon. Unremarkable bones.   IMPRESSION: Mild proximal small bowel ileus or partial obstruction.   Electronically Signed   By: Claudie Revering M.D.   On: 05/10/2015 11:47   Ct Abdomen Pelvis W Contrast  05/13/2015   CLINICAL DATA:  Right lower quadrant pain. History of gist tumor. Nausea, abdominal distention and diarrhea.  EXAM: CT ABDOMEN AND PELVIS WITH CONTRAST  TECHNIQUE: Multidetector CT imaging of the abdomen and pelvis was performed using the standard protocol following bolus administration of intravenous contrast.  CONTRAST:  172mL OMNIPAQUE IOHEXOL 300 MG/ML  SOLN  COMPARISON:  05/05/2015  FINDINGS: Lower chest: There are bilateral pleural effusions left greater night. New from previous exam. Overlying compressive type atelectasis/consolidation noted.  Hepatobiliary: No focal liver abnormality identified. The gallbladder is unremarkable. There is no biliary dilatation.  Pancreas: Negative  Spleen: Normal appearance of the spleen.  Adrenals/Urinary Tract: Normal adrenal glands. Normal appearance of the kidneys. Urinary bladder is collapsed chronic Foley catheter balloon.  Stomach/Bowel: Small hiatal hernia. The stomach is otherwise unremarkable. Mild increase caliber of the small bowel loops which measure up to 3.7 cm. There are areas of small bowel wall thickening noted, image number 43/series 2 Enteric contrast material is identified within the small bowel loops as well as the colon up to the level of the rectum. There is a large right inguinal hernia which contains fluid and a nonobstructed loop of small bowel.  Vascular/Lymphatic: Calcified atherosclerotic disease involves the abdominal aorta. No aneurysm. No retroperitoneal adenopathy identified. There is no pelvic or inguinal adenopathy.  Reproductive: Prostate gland and seminal vesicles are unremarkable.  Other: Within the right lower quadrant of the abdomen in the area of the previously noted distended tubular structure there is a multi loculated cystic mass which measures 6.4  x 6.2 by 4.0 cm. Again noted is abdominal and pelvic ascites. On today's study this appears more loculated. For example, along the inferior surface of the right lobe of liver there is loculated fluid, image number 38/series 2.  Musculoskeletal: No aggressive lytic or sclerotic bone lesions.  IMPRESSION: 1. Multi loculated cystic mass is identified within the right lower quadrant of the abdomen and is new from previous exam. This is worrisome for abscess which may be the sequelae of a perforated appendix. 2. There is a persistent ascites within the abdomen and pelvis which now appears more loculated and suggestive of peritonitis. 3. Increase caliber of small bowel loops worrisome for enteritis and ileus. Additionally, there is a right inguinal hernia which contains a loop of small bowel. This could be partially obstructed. There is no evidence for high-grade bowel obstruction however. 4. Critical Value/emergent results were called by telephone at the time of interpretation on 05/13/2015 at 1:10 pm to Dr. [name] , who verbally acknowledged these results.   Electronically Signed   By: Kerby Moors M.D.   On: 05/13/2015 13:13   US Renal  05/07/2015   CLINICAL DATA:  Urinary retention.  EXAM: RENAL / URINARY TRACT ULTRASOUND COMPLETE  COMPARISON:  CT scan abdomen dated 05/05/2015  FINDINGS: Right Kidney:  Length: 10.9 cm. Echogenicity within normal limits. No mass or hydronephrosis visualized.  Left Kidney:  Length: 11.5 cm. Echogenicity within normal limits. No mass or hydronephrosis visualized.  Bladder:  The bladder is empty with a Foley catheter in  place.  IMPRESSION: Normal kidneys.  No hydronephrosis or other abnormality.   Electronically Signed   By: Lorriane Shire M.D.   On: 05/07/2015 17:27   US Abdomen Limited  05/06/2015   CLINICAL DATA:  Abdominal pain.  Evaluate for pocket of ascites.  EXAM: LIMITED ABDOMEN ULTRASOUND FOR ASCITES  TECHNIQUE: Limited ultrasound survey for ascites was performed in all  four abdominal quadrants.  COMPARISON:  CT abdomen 05/05/2015.  FINDINGS: A drainable pocket of ascites was not identified in any of the 4 quadrants.  IMPRESSION: As above.   Electronically Signed   By: Rolla Flatten M.D.   On: 05/06/2015 12:45   Ct Image Guided Drainage By Percutaneous Catheter  05/13/2015   CLINICAL DATA:  Right lower quadrant perforated appendicitis with abscess  EXAM: CT GUIDED DRAINAGE OF RIGHT LOWER QUADRANT APPENDICEAL ABSCESS  ANESTHESIA/SEDATION: 1.0 Mg IV Versed 25 mcg IV Fentanyl  Total Moderate Sedation Time:  15 minutes  PROCEDURE: The procedure, risks, benefits, and alternatives were explained to the patient. Questions regarding the procedure were encouraged and answered. The patient understands and consents to the procedure.  The right lower quadrant was prepped with Betadinein a sterile fashion, and a sterile drape was applied covering the operative field. A sterile gown and sterile gloves were used for the procedure. Local anesthesia was provided with 1% Lidocaine.  Previous imaging reviewed. Patient positioned supine. Noncontrast localization CT performed through the lower abdomen. The right lower quadrant loculated abscess was localized. Under sterile conditions and local anesthesia, an 18 gauge 10 cm access needle was advanced from a lateral approach into the collection. Syringe aspiration yielded purulent fluid compatible with abscess. Amplatz guidewire inserted followed by tract dilatation to advance a 10 [place] drain. Retention loop formed in the abscess cavity and confirmed with CT. Syringe aspiration yielded 23 cc bloody purulent fluid. Sample sent for Gram stain and culture. Catheter secured with a Prolene suture and connected to external suction bulb. Sterile dressing applied.  COMPLICATIONS: None immediate  FINDINGS: Imaging confirms needle placement in the right lower quadrant loculated abscess for drainage  IMPRESSION: Successful CT-guided right lower quadrant  appendiceal abscess drain insertion.   Electronically Signed   By: Jerilynn Mages.  Shick M.D.   On: 05/13/2015 16:23     Medical Consultants:    Oncology  Surgery  Anti-Infectives:    Zosyn 05/05/15--->05/11/15  Rocephin 05/11/15--->  Flagyl 05/10/15--->  Subjective:   [name] [name]  No nausea or vomiting. Abdominal pain improved. Cheerful, family around. REPORTS rigth eye is itchy.   Objective:    Filed Vitals:   05/16/15 0628 05/16/15 1350 05/16/15 2154 05/17/15 0401  BP: 141/50 117/46 138/56 133/54  Pulse: 83 72 92 84  Temp: 99 F (37.2 C) 98.9 F (37.2 C) 99.3 F (37.4 C) 98.2 F (36.8 C)  TempSrc: Oral Oral Oral Oral  Resp: 20 22 20 20   Height:      Weight: 82.736 kg (182 lb 6.4 oz)   80.105 kg (176 lb 9.6 oz)  SpO2: 97% 95% 99% 97%    Intake/Output Summary (Last 24 hours) at 05/17/15 1002 Last data filed at 05/17/15 0600  Gross per 24 hour  Intake    495 ml  Output   2060 ml  Net  -1565 ml    Exam: Gen:  NAD Cardiovascular:  Tachy, No M/R/G Respiratory:  Lungs CTAB Gastrointestinal:  Abdomen distended,no tenderness. , +BS Extremities: upper extremity swelling much improved.    Data Reviewed:    Labs: Basic  Metabolic Panel:  Recent Labs Lab 05/11/15 0546 05/14/15 0430 05/17/15 0553  NA 141 140 139  K 4.1 4.4 3.7  CL 110 111 110  CO2 24 23 23   GLUCOSE 114* 99 107*  BUN 28* 22* 16  CREATININE 1.18 1.08 1.04  CALCIUM 8.3* 7.6* 7.5*   GFR Estimated Creatinine Clearance: 53.6 mL/min (by C-G formula based on Cr of 1.04). Liver Function Tests:  Recent Labs Lab 05/14/15 0430  AST 20  ALT 23  ALKPHOS 69  BILITOT 0.9  PROT 5.2*  ALBUMIN 2.1*   No results for input(s): LIPASE, AMYLASE in the last 168 hours. Coagulation profile No results for input(s): INR, PROTIME in the last 168 hours.  CBC:  Recent Labs Lab 05/11/15 0546 05/14/15 0430 05/16/15 0410 05/17/15 0553  WBC 10.0 12.6* 10.6* 9.5  HGB 9.3* 8.7* 8.5* 8.3*  HCT 27.5* 26.1*  26.4* 25.7*  MCV 103.8* 104.4* 106.0* 103.6*  PLT 230 379 462* 467*   BNP (last 3 results)  Recent Labs  09/12/14 0445  PROBNP 5726.0*   Sepsis Labs:  Recent Labs Lab 05/11/15 0546 05/14/15 0430 05/16/15 0410 05/17/15 0553  WBC 10.0 12.6* 10.6* 9.5   Microbiology Recent Results (from the past 240 hour(s))  Clostridium Difficile by PCR     Status: Abnormal   Collection Time: 05/10/15 11:00 AM  Result Value Ref Range Status   C difficile by pcr POSITIVE (A) NEGATIVE Final    Comment: CRITICAL RESULT CALLED TO, READ BACK BY AND VERIFIED WITH: I HABIB AT 1340 ON 05.22.2016 BY NBROOKS   Culture, routine-abscess     Status: None   Collection Time: 05/13/15  5:08 PM  Result Value Ref Range Status   Specimen Description ABSCESS  Final   Special Requests Normal  Final   Gram Stain   Final    ABUNDANT WBC PRESENT, PREDOMINANTLY PMN NO SQUAMOUS EPITHELIAL CELLS SEEN RARE GRAM POSITIVE COCCI IN PAIRS Performed at [facility]    Culture   Final    MULTIPLE ORGANISMS PRESENT, NONE PREDOMINANT Note: NO STAPHYLOCOCCUS AUREUS ISOLATED NO GROUP A STREP (S.PYOGENES) ISOLATED Performed at [facility]    Report Status 05/16/2015 FINAL  Final  Anaerobic culture     Status: None (Preliminary result)   Collection Time: 05/13/15  5:09 PM  Result Value Ref Range Status   Specimen Description ABSCESS  Final   Special Requests Normal  Final   Gram Stain   Final    ABUNDANT WBC PRESENT, PREDOMINANTLY PMN NO SQUAMOUS EPITHELIAL CELLS SEEN RARE GRAM POSITIVE COCCI IN PAIRS Performed at [facility]    Culture   Final    NO ANAEROBES ISOLATED; CULTURE IN PROGRESS FOR 5 DAYS Performed at [facility]    Report Status PENDING  Incomplete     Medications:   . amLODipine  5 mg Oral Daily  . cefTRIAXone (ROCEPHIN)  IV  1 g Intravenous Q24H  . furosemide  40 mg Intravenous Once  . imatinib  400 mg Oral Q breakfast  . metoprolol tartrate  25 mg  Oral BID  . metroNIDAZOLE  500 mg Oral 3 times per day  . saccharomyces boulardii  250 mg Oral BID  . simvastatin  20 mg Oral QPM  . sodium chloride  3 mL Intravenous Q12H   Continuous Infusions: . 0.9 % NaCl with KCl 40 mEq / L 10 mL/hr (05/16/15 0909)    Time spent: 25 minutes.   LOS: 11 days  [facility] Pager [phone]   If 7PM-7AM, please contact night-coverage at [email or web address], password [email or web address] for any overnight needs.  05/17/2015, 10:02 AM

## 2015-05-17 NOTE — Progress Notes (Signed)
Patient ID: [name] [name], male   DOB: [birth year], 81 y.o.   MRN: [id number]    Subjective: Had some diarrhea last night but overall feels better with no abdominal pain or nausea and eating well. Still with scrotal swelling.  Objective: Vital signs in last 24 hours: Temp:  [98.2 F (36.8 C)-99.3 F (37.4 C)] 98.2 F (36.8 C) (05/29 0401) Pulse Rate:  [72-92] 84 (05/29 0401) Resp:  [20-22] 20 (05/29 0401) BP: (117-138)/(46-56) 133/54 mmHg (05/29 0401) SpO2:  [95 %-99 %] 97 % (05/29 0401) Weight:  [80.105 kg (176 lb 9.6 oz)] 80.105 kg (176 lb 9.6 oz) (05/29 0401) Last BM Date: 05/16/15  Intake/Output from previous day: 05/28 0701 - 05/29 0700 In: 904.8 [P.O.:480; I.V.:369.8; IV Piggyback:50] Out: 2060 [Urine:2050; Drains:10] Intake/Output this shift:    General appearance: alert, cooperative and no distress GI: moderately distended but soft and nontender. Drainage cloudy serosanguineous Male genitalia: normal, scrotal swelling which is slightly improved today  Lab Results:   Recent Labs  05/16/15 0410 05/17/15 0553  WBC 10.6* 9.5  HGB 8.5* 8.3*  HCT 26.4* 25.7*  PLT 462* 467*   BMET  Recent Labs  05/17/15 0553  NA 139  K 3.7  CL 110  CO2 23  GLUCOSE 107*  BUN 16  CREATININE 1.04  CALCIUM 7.5*     Studies/Results: No results found.  Anti-infectives: Anti-infectives    Start     Dose/Rate Route Frequency Ordered Stop   05/11/15 1400  cefTRIAXone (ROCEPHIN) 1 g in dextrose 5 % 50 mL IVPB - Premix     1 g 100 mL/hr over 30 Minutes Intravenous Every 24 hours 05/11/15 1211     05/10/15 1500  metroNIDAZOLE (FLAGYL) tablet 500 mg     500 mg Oral 3 times per day 05/10/15 1354 05/24/15 1759   05/07/15 1045  ciprofloxacin (CIPRO) IVPB 400 mg  Status:  Discontinued     400 mg 200 mL/hr over 60 Minutes Intravenous Every 12 hours 05/07/15 1032 05/07/15 1032   05/07/15 1045  metroNIDAZOLE (FLAGYL) IVPB 500 mg  Status:  Discontinued     500 mg 100 mL/hr over 60 Minutes  Intravenous Every 8 hours 05/07/15 1032 05/07/15 1032   05/06/15 1400  piperacillin-tazobactam (ZOSYN) IVPB 3.375 g  Status:  Discontinued     3.375 g 12.5 mL/hr over 240 Minutes Intravenous 3 times per day 05/06/15 0535 05/11/15 1211   05/06/15 0300  vancomycin (VANCOCIN) IVPB 1000 mg/200 mL premix     1,000 mg 200 mL/hr over 60 Minutes Intravenous  Once 05/06/15 0247 05/06/15 0419   05/06/15 0245  piperacillin-tazobactam (ZOSYN) IVPB 3.375 g     3.375 g 12.5 mL/hr over 240 Minutes Intravenous  Once 05/06/15 0247 05/06/15 7253      Assessment/Plan: 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 Developed right lower quadrant abscess, status post percutaneous drain. C. Difficile colitis Overall improved with antibiotics and percutaneous drain. Pain and signs of infection resolving. Plan to discharge home on oral antibiotics and drain in place Still with some scrotal swelling and mild lower extremity swelling. Repeat Lasix today.      LOS: 11 days    Anjolaoluwa Siguenza T 05/17/2015

## 2015-05-18 LAB — ANAEROBIC CULTURE: Special Requests: NORMAL

## 2015-05-18 NOTE — Progress Notes (Signed)
Physical Therapy Treatment Patient Details Name: [name] [name] MRN: [id number] DOB: 1929-08-20 Today's Date: 05/18/2015    History of Present Illness 81 yo male admitted with SIRS. hx of NSTEMI, HTN, abdominal carcinomatosis. S/P R perc drain 5/25 for R LQ abscess.     PT Comments    Noted pt experiencing increased swelling of scrotal area making mobility difficult at times.   Follow Up Recommendations  Home health PT     Equipment Recommendations  None recommended by PT    Recommendations for Other Services       Precautions / Restrictions Precautions Precautions: Fall Restrictions Weight Bearing Restrictions: No    Mobility  Bed Mobility Overal bed mobility: Needs Assistance Bed Mobility: Supine to Sit     Supine to sit: [facility] elevated;Min assist     General bed mobility comments: increased time. Assist with trunk.   Transfers Overall transfer level: Needs assistance   Transfers: Sit to/from Stand Sit to Stand: Min assist         General transfer comment: increased time. Assist to rise especially from lower surfaces  Ambulation/Gait Ambulation/Gait assistance: Supervision Ambulation Distance (Feet): 500 Feet Assistive device: None (IV pole) Gait Pattern/deviations: Step-through pattern     General Gait Details: Pt walked ~50 feet without holding onto IV pole without LOB. Tolerated distance well.    Stairs            Wheelchair Mobility    Modified Rankin (Stroke Patients Only)       Balance                                    Cognition Arousal/Alertness: Awake/alert Behavior During Therapy: WFL for tasks assessed/performed Overall Cognitive Status: Within Functional Limits for tasks assessed                      Exercises      General Comments        Pertinent Vitals/Pain Pain Assessment: Faces Faces Pain Scale: Hurts little more Pain Location: scrotal area due to swelling Pain Descriptors / Indicators:  Sore;Discomfort Pain Intervention(s): Monitored during session    Home Living                      Prior Function            PT Goals (current goals can now be found in the care plan section) Progress towards PT goals: Progressing toward goals    Frequency  Min 3X/week    PT Plan Current plan remains appropriate    Co-evaluation             End of Session   Activity Tolerance: Patient tolerated treatment well Patient left: in chair;with call bell/phone within reach;with family/visitor present     Time: 1043-1105 PT Time Calculation (min) (ACUTE ONLY): 22 min  Charges:  $Gait Training: 8-22 mins                    G Codes:      Weston Anna, MPT Pager: [phone]

## 2015-05-18 NOTE — Progress Notes (Signed)
Patient ID: [name] [name], male   DOB: 03/16/1929, 81 y.o.   MRN: [id number] Patient ID: [name] [name], male   DOB: [birth year], 81 y.o.   MRN: [id number]    Subjective: Still some diarrhea last night but overall feels better with no abdominal pain or nausea and eating OK. Still with scrotal swelling, painless.  Objective: Vital signs in last 24 hours: Temp:  [98 F (36.7 C)-98.9 F (37.2 C)] 98 F (36.7 C) (05/30 0502) Pulse Rate:  [85-93] 93 (05/30 0502) Resp:  [20] 20 (05/30 0502) BP: (120-147)/(44-54) 120/54 mmHg (05/30 0502) SpO2:  [96 %-100 %] 100 % (05/30 0502) Weight:  [79.788 kg (175 lb 14.4 oz)] 79.788 kg (175 lb 14.4 oz) (05/30 0502) Last BM Date: 05/17/15  Intake/Output from previous day: 05/29 0701 - 05/30 0700 In: 130 [I.V.:80; IV Piggyback:50] Out: 945 [Urine:925; Drains:20] Intake/Output this shift:    General appearance: alert, cooperative and no distress GI: less distended, soft and nontender. Drainage cloudy serosanguineous Male genitalia:  scrotal edema which is slightly improved today  Lab Results:   Recent Labs  05/16/15 0410 05/17/15 0553  WBC 10.6* 9.5  HGB 8.5* 8.3*  HCT 26.4* 25.7*  PLT 462* 467*   BMET  Recent Labs  05/17/15 0553  NA 139  K 3.7  CL 110  CO2 23  GLUCOSE 107*  BUN 16  CREATININE 1.04  CALCIUM 7.5*     Studies/Results: US Scrotum  05/17/2015   CLINICAL DATA:  81 year old male with testicular swelling.  EXAM: ULTRASOUND OF SCROTUM  TECHNIQUE: Complete ultrasound examination of the testicles, epididymis, and other scrotal structures was performed.  COMPARISON:  None.  FINDINGS: Right testicle  Measurements: 4.1 x 2.8 x 3.1 cm. No mass or microlithiasis visualized. Symmetric blood flow with the left testis.  Left testicle  Measurements: 4.5 x 3.0 x 2.8 cm. No mass or microlithiasis visualized. Symmetric blood flow with the right testis.  Right epididymis:  Normal in size and appearance.  Left epididymis:  Normal in size and  appearance.  Hydrocele:  Small bilateral hydroceles, right greater than left.  Varicocele:  None visualized.  Other: There is diffuse symmetric edema of the scrotal skin without focal fluid collection.  IMPRESSION: 1. Diffuse soft tissue edema of the scrotal skin. No focal fluid collection or abscess. 2. Small bilateral hydroceles, right greater than left. 3. Normal appearance of both testis and epididymis.   Electronically Signed   By: Jeb Levering M.D.   On: 05/17/2015 21:42    Anti-infectives: Anti-infectives    Start     Dose/Rate Route Frequency Ordered Stop   05/11/15 1400  cefTRIAXone (ROCEPHIN) 1 g in dextrose 5 % 50 mL IVPB - Premix     1 g 100 mL/hr over 30 Minutes Intravenous Every 24 hours 05/11/15 1211     05/10/15 1500  metroNIDAZOLE (FLAGYL) tablet 500 mg     500 mg Oral 3 times per day 05/10/15 1354 05/24/15 1759   05/07/15 1045  ciprofloxacin (CIPRO) IVPB 400 mg  Status:  Discontinued     400 mg 200 mL/hr over 60 Minutes Intravenous Every 12 hours 05/07/15 1032 05/07/15 1032   05/07/15 1045  metroNIDAZOLE (FLAGYL) IVPB 500 mg  Status:  Discontinued     500 mg 100 mL/hr over 60 Minutes Intravenous Every 8 hours 05/07/15 1032 05/07/15 1032   05/06/15 1400  piperacillin-tazobactam (ZOSYN) IVPB 3.375 g  Status:  Discontinued     3.375 g 12.5 mL/hr over 240 Minutes Intravenous  3 times per day 05/06/15 0535 05/11/15 1211   05/06/15 0300  vancomycin (VANCOCIN) IVPB 1000 mg/200 mL premix     1,000 mg 200 mL/hr over 60 Minutes Intravenous  Once 05/06/15 0247 05/06/15 0419   05/06/15 0245  piperacillin-tazobactam (ZOSYN) IVPB 3.375 g     3.375 g 12.5 mL/hr over 240 Minutes Intravenous  Once 05/06/15 0247 05/06/15 8101      Assessment/Plan: 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 Developed right lower quadrant abscess, status post percutaneous drain. C. Difficile  colitis Overall improved with antibiotics and percutaneous drain. Pain and signs of infection resolving. Plan to discharge home on oral antibiotics and drain in place.  I think could go home tomorrow Still with some scrotal swelling and mild lower extremity swelling, but diuresing and should resolve.       LOS: 12 days    Koraima Albertsen T 05/18/2015

## 2015-05-18 NOTE — Progress Notes (Signed)
Progress Note   [name] [name] GGE:[id number] DOB: 12/03/1929 DOA: 05/05/2015 PCP: Abigail Miyamoto, MD   Brief Narrative:   [name] [name] is an 81 y.o. male with h/o GIST tumor, peritoneal carcinomatosis, being treated with [place] who was admitted 05/05/15 with a chief complaint of abdominal pain associated with nausea. A CT scan, done on admission, showed possible findings consistent with appendicitis versus progressive abdominal carcinomatosis. The patient was placed on empiric Zosyn. He had significant abdominal distention and on 05/10/15 developed diarrhea with subsequent C. difficile testing positive. Flagyl was started at that time. General surgery continues to follow for possible appendicitis, which is being treated medically with antibiotics. Oncology also following. Repeat CT scan ordered for 05/13/15 by surgeon showed loculated fluid collection, underwent IR drain placement and bloody pus drained out. His abdominal pain is much improved after the drain was placed. Recommend continue the drain and IR is following. When the amount of drain is less than 15 ml/day, plan to follow up with a repeat CT scan.  Follow up with abscess drain cultures.  Assessment/Plan:   Principal Problem:   Sepsis vs SIRS secondary to appendicitis versus abdominal carcinomatosis versus C. difficile colitis with associated abdominal pain - Status post vigorous hydration in the ED. Blood cultures negative to date. - IR consulted for paracentesis to rule out SBP.  Only trace ascites, so could not be done. - Initially treated with empiric zosyn for appendicitis although not entirely clear based on CT findings.  - Narrowed to Rocephin 05/11/15 given C diff +. - Repeat CT scan ordered for 05/13/15 by surgeon showed loculated fluid collection, underwent IR drain placement and bloody pus drained out. His abdominal pain is much improved after the drain was placed. Recommend continue the drain and IR is following. When the amount  of drain is less than 15 ml/day, plan to follow up with a repeat CT scan.  Follow up with abscess drain cultures. Prelim reports shows multiple bacteria, no dominant bacteria seen. Plan to d/c rocephin soon.  - Continue pain control efforts with Dilaudid.    Active Problems:   C. Diff diarrhea - The patient developed explosive diarrhea 05/10/15. C. difficile positive.  - Continue Flagyl.  Contact isolation.- pt had 2 bm, last night.     Urinary retention, likely secondary to pain medication adverse effect versus tumor causing obstruction of urethra - Foley catheter placed 05/07/15. - Renal ultrasound negative for hydronephrosis.    Chronic anemia, multifactorial with recent ABLA, anemia of chronic disease contributory - Hemoglobin drop likely dilutional.  Baseline hemoglobin 10.9.  Current hemoglobin stable at 8.3.recommend PRBC transfusion when hemoglobin is less than 7.     Acute kidney failure, pre-renal from dehydration in the setting of stage III CKD - Renal function improved with IVF.  Baseline creatinine 1.2.  Creatinine back to usual baseline values.    GIST (gastrointestinal stromal tumor) of small bowel, malignant - Dr. [name] following, recommendations to continue Gleevac as it might recurr.     Hypertension - Continue metoprolol and Norvasc. Hydrochlorothiazide on hold.    Hyperlipidemia - Continue Zocor.  Fluid overload, scrotal swelling and pedal edema: getting IV lasix as needed.  Get scrotal US shows diffuse soft tissue edema of the scrotal skin.no abscess seen. Small bilateral hydroceles.   Right eye redness:  Probably allergic in nature, reports some itching, which has resolved.     DVT Prophylaxis - SCDs ordered.  Code Status: Full. Family Communication: none at bedside Disposition  Plan: pending further management. Possibly home with home PT early next week.   IV Access:    Peripheral IV   Procedures and diagnostic studies:   Ct Abdomen Pelvis Wo  Contrast  05/06/2015   CLINICAL DATA:  Mid right-sided abdominal pain, onset tonight. Nausea without vomiting. History of GI stromal tumor of small bowel.  EXAM: CT ABDOMEN AND PELVIS WITHOUT CONTRAST  TECHNIQUE: Multidetector CT imaging of the abdomen and pelvis was performed following the standard protocol without IV contrast.  COMPARISON:  CT 01/13/2015  FINDINGS: Resolution of previous left pleural effusion. Depending ground-glass opacity in the right lower lobe consistent with atelectasis. The heart is enlarged.  Lack of IV contrast limits assessment for focal hepatic lesion, allowing for this, no focal lesion is seen. Gallstones are seen within the gallbladder which is physiologically distended. Small amount perihepatic ascites, the degree of which is slightly increased from prior exam. Spleen is normal in size, development of small volume of perisplenic ascites.  Mesenteric and omental edema and ill-defined soft tissue omental densities consistent with known peritoneal disease. Soft tissue nodule in the midline upper abdomen measures 2.8 x 2.2 cm, previously 3.5 x 2.3 cm.  Small and large bowel are suboptimally assessed given lack contrast and haziness throughout the abdomen. The stomach through is distended with fluid, small amount of fluid in the distal esophagus is small hiatal hernia. Enteric anastomosis in the mid to left abdomen. Bowel loops in the right lower quadrant of the abdomen are suboptimally defined. There is hyperattenuation within a tubular structure in the right lower quadrant that may reflect the appendix, however neoplasm is not entirely excluded. There is increased soft tissue density/fluid and edema about this structure which may reflect increased ascites. Edema and fluid tracks in the right inguinal canal with possible peritoneal implant in the inguinal canal. There are multiple colonic diverticula containing retained barium from prior exams.  No adrenal nodule. Pancreas is normal.  There is no hydronephrosis, kidneys are symmetric in size. Questionable soft tissue density in the region of the porta, may reflect periportal lymph nodes.  Dense atherosclerosis of the abdominal aorta without aneurysm. Small retroperitoneal soft tissue densities.  In the pelvis the bladder is physiologically distended. Prostate gland is normal in size. Small amount pelvic ascites.  No lytic or blastic osseous lesions. Degenerative change in the spine.  IMPRESSION: 1. Increased intra-abdominal ascites in the right and left upper quadrant and right lower quadrant, the degree of which remains mild. There is increased peritoneal and omental edema of the right lower quadrant. This edema and soft tissue density tracks into the right inguinal canal which may reflect progression of known metastatic disease, versus decompressed small bowel tracking in the right inguinal hernia. No proximal small bowel dilatation to suggest obstruction. 2. There is however interval improvement in size of dominant omental nodule in the anterior upper abdomen from prior. 3. Tubular structure in the right lower quadrant containing high-density. This high-density was seen on prior exam, it is unclear whether this represents the appendix with retained barium versus related to neoplasm or metastatic disease. There is overall increase in the fluid and soft tissue in the right lower quadrant, in addition adjacent to these tubular structure, however the appearance does not favor that of appendicitis. 4. Cholelithiasis without findings of acute cholecystitis. Diverticulosis without diverticulitis.   Electronically Signed   By: Jeb Levering M.D.   On: 05/06/2015 00:17   Dg Abd 1 View  05/10/2015   CLINICAL DATA:  Abdominal distention. 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  EXAM: ABDOMEN - 1 VIEW  COMPARISON:  Abdomen and pelvis CT dated 05/05/2015.  FINDINGS: Mildly dilated small bowel loops in the left upper abdomen. Normal caliber  gas filled right and transverse colon. Unremarkable bones.  IMPRESSION: Mild proximal small bowel ileus or partial obstruction.   Electronically Signed   By: Claudie Revering M.D.   On: 05/10/2015 11:47   US Scrotum  05/17/2015   CLINICAL DATA:  81 year old male with testicular swelling.  EXAM: ULTRASOUND OF SCROTUM  TECHNIQUE: Complete ultrasound examination of the testicles, epididymis, and other scrotal structures was performed.  COMPARISON:  None.  FINDINGS: Right testicle  Measurements: 4.1 x 2.8 x 3.1 cm. No mass or microlithiasis visualized. Symmetric blood flow with the left testis.  Left testicle  Measurements: 4.5 x 3.0 x 2.8 cm. No mass or microlithiasis visualized. Symmetric blood flow with the right testis.  Right epididymis:  Normal in size and appearance.  Left epididymis:  Normal in size and appearance.  Hydrocele:  Small bilateral hydroceles, right greater than left.  Varicocele:  None visualized.  Other: There is diffuse symmetric edema of the scrotal skin without focal fluid collection.  IMPRESSION: 1. Diffuse soft tissue edema of the scrotal skin. No focal fluid collection or abscess. 2. Small bilateral hydroceles, right greater than left. 3. Normal appearance of both testis and epididymis.   Electronically Signed   By: Jeb Levering M.D.   On: 05/17/2015 21:42   Ct Abdomen Pelvis W Contrast  05/13/2015   CLINICAL DATA:  Right lower quadrant pain. History of gist tumor. Nausea, abdominal distention and diarrhea.  EXAM: CT ABDOMEN AND PELVIS WITH CONTRAST  TECHNIQUE: Multidetector CT imaging of the abdomen and pelvis was performed using the standard protocol following bolus administration of intravenous contrast.  CONTRAST:  115mL OMNIPAQUE IOHEXOL 300 MG/ML  SOLN  COMPARISON:  05/05/2015  FINDINGS: Lower chest: There are bilateral pleural effusions left greater night. New from previous exam. Overlying compressive type atelectasis/consolidation noted.  Hepatobiliary: No focal liver  abnormality identified. The gallbladder is unremarkable. There is no biliary dilatation.  Pancreas: Negative  Spleen: Normal appearance of the spleen.  Adrenals/Urinary Tract: Normal adrenal glands. Normal appearance of the kidneys. Urinary bladder is collapsed chronic Foley catheter balloon.  Stomach/Bowel: Small hiatal hernia. The stomach is otherwise unremarkable. Mild increase caliber of the small bowel loops which measure up to 3.7 cm. There are areas of small bowel wall thickening noted, image number 43/series 2 Enteric contrast material is identified within the small bowel loops as well as the colon up to the level of the rectum. There is a large right inguinal hernia which contains fluid and a nonobstructed loop of small bowel.  Vascular/Lymphatic: Calcified atherosclerotic disease involves the abdominal aorta. No aneurysm. No retroperitoneal adenopathy identified. There is no pelvic or inguinal adenopathy.  Reproductive: Prostate gland and seminal vesicles are unremarkable.  Other: Within the right lower quadrant of the abdomen in the area of the previously noted distended tubular structure there is a multi loculated cystic mass which measures 6.4 x 6.2 by 4.0 cm. Again noted is abdominal and pelvic ascites. On today's study this appears more loculated. For example, along the inferior surface of the right lobe of liver there is loculated fluid, image number 38/series 2.  Musculoskeletal: No aggressive lytic or sclerotic bone lesions.  IMPRESSION: 1. Multi loculated cystic mass is identified within the right lower quadrant of the abdomen and is new  from previous exam. This is worrisome for abscess which may be the sequelae of a perforated appendix. 2. There is a persistent ascites within the abdomen and pelvis which now appears more loculated and suggestive of peritonitis. 3. Increase caliber of small bowel loops worrisome for enteritis and ileus. Additionally, there is a right inguinal hernia which contains  a loop of small bowel. This could be partially obstructed. There is no evidence for high-grade bowel obstruction however. 4. Critical Value/emergent results were called by telephone at the time of interpretation on 05/13/2015 at 1:10 pm to Dr. [name] , who verbally acknowledged these results.   Electronically Signed   By: Kerby Moors M.D.   On: 05/13/2015 13:13   US Renal  05/07/2015   CLINICAL DATA:  Urinary retention.  EXAM: RENAL / URINARY TRACT ULTRASOUND COMPLETE  COMPARISON:  CT scan abdomen dated 05/05/2015  FINDINGS: Right Kidney:  Length: 10.9 cm. Echogenicity within normal limits. No mass or hydronephrosis visualized.  Left Kidney:  Length: 11.5 cm. Echogenicity within normal limits. No mass or hydronephrosis visualized.  Bladder:  The bladder is empty with a Foley catheter in place.  IMPRESSION: Normal kidneys.  No hydronephrosis or other abnormality.   Electronically Signed   By: Lorriane Shire M.D.   On: 05/07/2015 17:27   US Abdomen Limited  05/06/2015   CLINICAL DATA:  Abdominal pain.  Evaluate for pocket of ascites.  EXAM: LIMITED ABDOMEN ULTRASOUND FOR ASCITES  TECHNIQUE: Limited ultrasound survey for ascites was performed in all four abdominal quadrants.  COMPARISON:  CT abdomen 05/05/2015.  FINDINGS: A drainable pocket of ascites was not identified in any of the 4 quadrants.  IMPRESSION: As above.   Electronically Signed   By: Rolla Flatten M.D.   On: 05/06/2015 12:45   Ct Image Guided Drainage By Percutaneous Catheter  05/13/2015   CLINICAL DATA:  Right lower quadrant perforated appendicitis with abscess  EXAM: CT GUIDED DRAINAGE OF RIGHT LOWER QUADRANT APPENDICEAL ABSCESS  ANESTHESIA/SEDATION: 1.0 Mg IV Versed 25 mcg IV Fentanyl  Total Moderate Sedation Time:  15 minutes  PROCEDURE: The procedure, risks, benefits, and alternatives were explained to the patient. Questions regarding the procedure were encouraged and answered. The patient understands and consents to the procedure.  The  right lower quadrant was prepped with Betadinein a sterile fashion, and a sterile drape was applied covering the operative field. A sterile gown and sterile gloves were used for the procedure. Local anesthesia was provided with 1% Lidocaine.  Previous imaging reviewed. Patient positioned supine. Noncontrast localization CT performed through the lower abdomen. The right lower quadrant loculated abscess was localized. Under sterile conditions and local anesthesia, an 18 gauge 10 cm access needle was advanced from a lateral approach into the collection. Syringe aspiration yielded purulent fluid compatible with abscess. Amplatz guidewire inserted followed by tract dilatation to advance a 10 [place] drain. Retention loop formed in the abscess cavity and confirmed with CT. Syringe aspiration yielded 23 cc bloody purulent fluid. Sample sent for Gram stain and culture. Catheter secured with a Prolene suture and connected to external suction bulb. Sterile dressing applied.  COMPLICATIONS: None immediate  FINDINGS: Imaging confirms needle placement in the right lower quadrant loculated abscess for drainage  IMPRESSION: Successful CT-guided right lower quadrant appendiceal abscess drain insertion.   Electronically Signed   By: Jerilynn Mages.  Shick M.D.   On: 05/13/2015 16:23     Medical Consultants:    Oncology  Surgery  Anti-Infectives:    Zosyn 05/05/15--->05/11/15  Rocephin 05/11/15--->  Flagyl 05/10/15--->  Subjective:   [name] [name]  No nausea or vomiting. Abdominal pain improved. Cheerful, family around. REPORTS rigth eye is itchy.   Objective:    Filed Vitals:   05/17/15 0401 05/17/15 1415 05/17/15 2105 05/18/15 0502  BP: 133/54 120/47 147/44 120/54  Pulse: 84 85 92 93  Temp: 98.2 F (36.8 C) 98.7 F (37.1 C) 98.9 F (37.2 C) 98 F (36.7 C)  TempSrc: Oral Oral Oral Oral  Resp: 20 20 20 20   Height:      Weight: 80.105 kg (176 lb 9.6 oz)   79.788 kg (175 lb 14.4 oz)  SpO2: 97% 98% 96% 100%     Intake/Output Summary (Last 24 hours) at 05/18/15 1420 Last data filed at 05/18/15 1405  Gross per 24 hour  Intake 754.16 ml  Output    575 ml  Net 179.16 ml    Exam: Gen:  NAD Cardiovascular:  Tachy, No M/R/G Respiratory:  Lungs CTAB Gastrointestinal:  Abdomen distended,no tenderness. , +BS Extremities: upper extremity swelling much improved.    Data Reviewed:    Labs: Basic Metabolic Panel:  Recent Labs Lab 05/14/15 0430 05/17/15 0553  NA 140 139  K 4.4 3.7  CL 111 110  CO2 23 23  GLUCOSE 99 107*  BUN 22* 16  CREATININE 1.08 1.04  CALCIUM 7.6* 7.5*   GFR Estimated Creatinine Clearance: 53.5 mL/min (by C-G formula based on Cr of 1.04). Liver Function Tests:  Recent Labs Lab 05/14/15 0430  AST 20  ALT 23  ALKPHOS 69  BILITOT 0.9  PROT 5.2*  ALBUMIN 2.1*   No results for input(s): LIPASE, AMYLASE in the last 168 hours. Coagulation profile No results for input(s): INR, PROTIME in the last 168 hours.  CBC:  Recent Labs Lab 05/14/15 0430 05/16/15 0410 05/17/15 0553  WBC 12.6* 10.6* 9.5  HGB 8.7* 8.5* 8.3*  HCT 26.1* 26.4* 25.7*  MCV 104.4* 106.0* 103.6*  PLT 379 462* 467*   BNP (last 3 results)  Recent Labs  09/12/14 0445  PROBNP 5726.0*   Sepsis Labs:  Recent Labs Lab 05/14/15 0430 05/16/15 0410 05/17/15 0553  WBC 12.6* 10.6* 9.5   Microbiology Recent Results (from the past 240 hour(s))  Clostridium Difficile by PCR     Status: Abnormal   Collection Time: 05/10/15 11:00 AM  Result Value Ref Range Status   C difficile by pcr POSITIVE (A) NEGATIVE Final    Comment: CRITICAL RESULT CALLED TO, READ BACK BY AND VERIFIED WITH: I HABIB AT 1340 ON 05.22.2016 BY NBROOKS   Culture, routine-abscess     Status: None   Collection Time: 05/13/15  5:08 PM  Result Value Ref Range Status   Specimen Description ABSCESS  Final   Special Requests Normal  Final   Gram Stain   Final    ABUNDANT WBC PRESENT, PREDOMINANTLY PMN NO SQUAMOUS  EPITHELIAL CELLS SEEN RARE GRAM POSITIVE COCCI IN PAIRS Performed at [facility]    Culture   Final    MULTIPLE ORGANISMS PRESENT, NONE PREDOMINANT Note: NO STAPHYLOCOCCUS AUREUS ISOLATED NO GROUP A STREP (S.PYOGENES) ISOLATED Performed at [facility]    Report Status 05/16/2015 FINAL  Final  Anaerobic culture     Status: None   Collection Time: 05/13/15  5:09 PM  Result Value Ref Range Status   Specimen Description ABSCESS  Final   Special Requests Normal  Final   Gram Stain   Final    ABUNDANT WBC PRESENT,  PREDOMINANTLY PMN NO SQUAMOUS EPITHELIAL CELLS SEEN RARE GRAM POSITIVE COCCI IN PAIRS Performed at [facility]    Culture   Final    NO ANAEROBES ISOLATED Performed at [facility]    Report Status 05/18/2015 FINAL  Final     Medications:   . amLODipine  5 mg Oral Daily  . cefTRIAXone (ROCEPHIN)  IV  1 g Intravenous Q24H  . imatinib  400 mg Oral Q breakfast  . metoprolol tartrate  25 mg Oral BID  . metroNIDAZOLE  500 mg Oral 3 times per day  . saccharomyces boulardii  250 mg Oral BID  . simvastatin  20 mg Oral QPM  . sodium chloride  3 mL Intravenous Q12H   Continuous Infusions: . 0.9 % NaCl with KCl 40 mEq / L Stopped (05/18/15 1405)    Time spent: 25 minutes.   LOS: 12 days   [facility] Pager [phone]   If 7PM-7AM, please contact night-coverage at [email or web address], password [email or web address] for any overnight needs.  05/18/2015, 2:20 PM

## 2015-05-19 ENCOUNTER — Other Ambulatory Visit (HOSPITAL_COMMUNITY): Payer: Medicare Other

## 2015-05-19 ENCOUNTER — Inpatient Hospital Stay (HOSPITAL_COMMUNITY): Payer: Medicare Other

## 2015-05-19 ENCOUNTER — Telehealth: Payer: Self-pay | Admitting: Nurse Practitioner

## 2015-05-19 ENCOUNTER — Other Ambulatory Visit: Payer: Self-pay | Admitting: Oncology

## 2015-05-19 ENCOUNTER — Encounter (HOSPITAL_COMMUNITY): Payer: Self-pay | Admitting: Radiology

## 2015-05-19 DIAGNOSIS — C49A3 Gastrointestinal stromal tumor of small intestine: Secondary | ICD-10-CM

## 2015-05-19 MED ORDER — IOHEXOL 300 MG/ML  SOLN
100.0000 mL | Freq: Once | INTRAMUSCULAR | Status: AC | PRN
  Administered 2015-05-19: 100 mL via INTRAVENOUS

## 2015-05-19 NOTE — Telephone Encounter (Signed)
Lft msg for pt confirming labs/ov per 05/31 POF, mailed schedule to pt and sent msg through my chart... KJ

## 2015-05-19 NOTE — Progress Notes (Signed)
[facility] Progress Note     Subjective: Pt has no more pain.  Drain emptying very little.  No N/V, tolerating soft diet.  Ambulating OOB.  No complaints.  Says Dr. Karleen Hampshire ordered a CT scan.  Objective: Vital signs in last 24 hours: Temp:  [98.2 F (36.8 C)-98.8 F (37.1 C)] 98.7 F (37.1 C) (05/31 1343) Pulse Rate:  [77-92] 77 (05/31 1343) Resp:  [16-20] 16 (05/31 1343) BP: (116-143)/(43-59) 116/53 mmHg (05/31 1343) SpO2:  [98 %-99 %] 99 % (05/31 1343) Weight:  [80.015 kg (176 lb 6.4 oz)] 80.015 kg (176 lb 6.4 oz) (05/31 0509) Last BM Date: 05/18/15  Intake/Output from previous day: 05/30 0701 - 05/31 0700 In: 1023.3 [P.O.:720; I.V.:243.3; IV Piggyback:50] Out: 74 [Urine:650; Drains:15] Intake/Output this shift: Total I/O In: 360 [P.O.:360] Out: 230 [Urine:225; Drains:5]  PE: Gen:  Alert, NAD, pleasant Abd: Soft, NT, mild distension, +BS, no HSM, RLQ drain with serous drainage, abdominal scars noted   Lab Results:   Recent Labs  05/17/15 0553  WBC 9.5  HGB 8.3*  HCT 25.7*  PLT 467*   BMET  Recent Labs  05/17/15 0553  NA 139  K 3.7  CL 110  CO2 23  GLUCOSE 107*  BUN 16  CREATININE 1.04  CALCIUM 7.5*   PT/INR No results for input(s): LABPROT, INR in the last 72 hours. CMP     Component Value Date/Time   NA 139 05/17/2015 0553   NA 145 04/09/2015 0900   K 3.7 05/17/2015 0553   K 4.1 04/09/2015 0900   CL 110 05/17/2015 0553   CO2 23 05/17/2015 0553   CO2 27 04/09/2015 0900   GLUCOSE 107* 05/17/2015 0553   GLUCOSE 116 04/09/2015 0900   BUN 16 05/17/2015 0553   BUN 19.3 04/09/2015 0900   CREATININE 1.04 05/17/2015 0553   CREATININE 1.2 04/09/2015 0900   CREATININE 1.18 04/25/2013 1041   CALCIUM 7.5* 05/17/2015 0553   CALCIUM 8.6 04/09/2015 0900   PROT 5.2* 05/14/2015 0430   PROT 6.3* 04/09/2015 0900   ALBUMIN 2.1* 05/14/2015 0430   ALBUMIN 3.6 04/09/2015 0900   AST 20 05/14/2015 0430   AST 26 04/09/2015 0900   ALT 23  05/14/2015 0430   ALT 34 04/09/2015 0900   ALKPHOS 69 05/14/2015 0430   ALKPHOS 79 04/09/2015 0900   BILITOT 0.9 05/14/2015 0430   BILITOT 0.55 04/09/2015 0900   GFRNONAA >60 05/17/2015 0553   GFRAA >60 05/17/2015 0553   Lipase     Component Value Date/Time   LIPASE 21* 05/05/2015 2137       Studies/Results: US Scrotum  05/17/2015   CLINICAL DATA:  81 year old male with testicular swelling.  EXAM: ULTRASOUND OF SCROTUM  TECHNIQUE: Complete ultrasound examination of the testicles, epididymis, and other scrotal structures was performed.  COMPARISON:  None.  FINDINGS: Right testicle  Measurements: 4.1 x 2.8 x 3.1 cm. No mass or microlithiasis visualized. Symmetric blood flow with the left testis.  Left testicle  Measurements: 4.5 x 3.0 x 2.8 cm. No mass or microlithiasis visualized. Symmetric blood flow with the right testis.  Right epididymis:  Normal in size and appearance.  Left epididymis:  Normal in size and appearance.  Hydrocele:  Small bilateral hydroceles, right greater than left.  Varicocele:  None visualized.  Other: There is diffuse symmetric edema of the scrotal skin without focal fluid collection.  IMPRESSION: 1. Diffuse soft tissue edema of the scrotal skin. No focal fluid collection or abscess. 2. Small  bilateral hydroceles, right greater than left. 3. Normal appearance of both testis and epididymis.   Electronically Signed   By: Jeb Levering M.D.   On: 05/17/2015 21:42    Anti-infectives: Anti-infectives    Start     Dose/Rate Route Frequency Ordered Stop   05/11/15 1400  cefTRIAXone (ROCEPHIN) 1 g in dextrose 5 % 50 mL IVPB - Premix     1 g 100 mL/hr over 30 Minutes Intravenous Every 24 hours 05/11/15 1211     05/10/15 1500  metroNIDAZOLE (FLAGYL) tablet 500 mg     500 mg Oral 3 times per day 05/10/15 1354 05/24/15 1759   05/07/15 1045  ciprofloxacin (CIPRO) IVPB 400 mg  Status:  Discontinued     400 mg 200 mL/hr over 60 Minutes Intravenous Every 12 hours 05/07/15  1032 05/07/15 1032   05/07/15 1045  metroNIDAZOLE (FLAGYL) IVPB 500 mg  Status:  Discontinued     500 mg 100 mL/hr over 60 Minutes Intravenous Every 8 hours 05/07/15 1032 05/07/15 1032   05/06/15 1400  piperacillin-tazobactam (ZOSYN) IVPB 3.375 g  Status:  Discontinued     3.375 g 12.5 mL/hr over 240 Minutes Intravenous 3 times per day 05/06/15 0535 05/11/15 1211   05/06/15 0300  vancomycin (VANCOCIN) IVPB 1000 mg/200 mL premix     1,000 mg 200 mL/hr over 60 Minutes Intravenous  Once 05/06/15 0247 05/06/15 0419   05/06/15 0245  piperacillin-tazobactam (ZOSYN) IVPB 3.375 g     3.375 g 12.5 mL/hr over 240 Minutes Intravenous  Once 05/06/15 0247 05/06/15 4356       Assessment/Plan Abdominal pain, possible appendicitis vs pain from carcinomatosis -The patient's history and symptoms seem consistent with appendicitis; however, it is possible this is all related to his carcinomatosis as well. CT scan is equivocal.  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 [name] did this patients surgery back in 06/04/2013 (Ex lap with SBR) -On Gleevec C.diff colitis -started on flagyl PO Day #10 DVT Proph -Can be on Chemical DVT proph, only currently on SCD's (per primary service)  Await CT scan results, drain may be able to be discontinued if collection is resolved.  If not will need to d/c home with it and follow up with IR drain clinic for rechecking.  Follow up with Dr. [name] at discharge in 2-3 weeks    LOS: 13 days    Nat Christen 05/19/2015, 1:51 PM Pager: [phone]

## 2015-05-19 NOTE — Progress Notes (Signed)
Progress Note   Tully Mcinturff VZD:[id number] DOB: 1929-05-14 DOA: 05/05/2015 PCP: Abigail Miyamoto, MD   Brief Narrative:   [name] [name] is an 81 y.o. male with h/o GIST tumor, peritoneal carcinomatosis, being treated with [facility] who was admitted 05/05/15 with a chief complaint of abdominal pain associated with nausea. A CT scan, done on admission, showed possible findings consistent with appendicitis versus progressive abdominal carcinomatosis. The patient was placed on empiric Zosyn. He had significant abdominal distention and on 05/10/15 developed diarrhea with subsequent C. difficile testing positive. Flagyl was started at that time. General surgery continues to follow for possible appendicitis, which is being treated medically with antibiotics. Oncology also following. Repeat CT scan ordered for 05/13/15 by surgeon showed loculated fluid collection, underwent IR drain placement and bloody pus drained out. His abdominal pain is much improved after the drain was placed. Recommend continue the drain and IR is following. When the amount of drain is less than 15 ml/day, plan to follow up with a repeat CT scan.  Follow up with abscess drain cultures.  Assessment/Plan:   Principal Problem:   Sepsis vs SIRS secondary to appendicitis versus abdominal carcinomatosis versus C. difficile colitis with associated abdominal pain - Status post vigorous hydration in the ED. Blood cultures negative to date. - IR consulted for paracentesis to rule out SBP.  Only trace ascites, so could not be done. - Initially treated with empiric zosyn for appendicitis although not entirely clear based on CT findings.  - Narrowed to Rocephin 05/11/15 given C diff +. - Repeat CT scan ordered for 05/13/15 by surgeon showed loculated fluid collection, underwent IR drain placement and bloody pus drained out. His abdominal pain is much improved after the drain was placed. Recommend continue the drain and IR is following. When the amount  of drain is less than 15 ml/day, plan to follow up with a repeat CT scan.  Follow up with abscess drain cultures. Prelim reports shows multiple bacteria, no dominant bacteria seen. Plan to d/c rocephin soon.  - Continue pain control efforts with Dilaudid.   Repeat CT abdomen ordered and pending.   Active Problems:   C. Diff diarrhea - The patient developed explosive diarrhea 05/10/15. C. difficile positive.  - Continue Flagyl.  Contact isolation.- pt had 2 bm, last night.     Urinary retention, likely secondary to pain medication adverse effect versus tumor causing obstruction of urethra - Foley catheter placed 05/07/15. - Renal ultrasound negative for hydronephrosis.    Chronic anemia, multifactorial with recent ABLA, anemia of chronic disease contributory - Hemoglobin drop likely dilutional.  Baseline hemoglobin 10.9.  Current hemoglobin stable at 8.3.recommend PRBC transfusion when hemoglobin is less than 7.     Acute kidney failure, pre-renal from dehydration in the setting of stage III CKD - Renal function improved with IVF.  Baseline creatinine 1.2.  Creatinine back to usual baseline values.    GIST (gastrointestinal stromal tumor) of small bowel, malignant - Dr. [name] following, recommendations to continue Gleevac as it might recurr.     Hypertension - Continue metoprolol and Norvasc. Hydrochlorothiazide on hold.    Hyperlipidemia - Continue Zocor.  Fluid overload, scrotal swelling and pedal edema: getting IV lasix as needed.  Get scrotal US shows diffuse soft tissue edema of the scrotal skin.no abscess seen. Small bilateral hydroceles.   Right eye redness:  Probably allergic in nature, reports some itching, which has resolved.     DVT Prophylaxis - SCDs ordered.  Code Status:  Full. Family Communication: none at bedside Disposition Plan: pending further management. Possibly home with home PT early next week.   IV Access:    Peripheral IV   Procedures and  diagnostic studies:   Ct Abdomen Pelvis Wo Contrast  05/06/2015   CLINICAL DATA:  Mid right-sided abdominal pain, onset tonight. Nausea without vomiting. History of GI stromal tumor of small bowel.  EXAM: CT ABDOMEN AND PELVIS WITHOUT CONTRAST  TECHNIQUE: Multidetector CT imaging of the abdomen and pelvis was performed following the standard protocol without IV contrast.  COMPARISON:  CT 01/13/2015  FINDINGS: Resolution of previous left pleural effusion. Depending ground-glass opacity in the right lower lobe consistent with atelectasis. The heart is enlarged.  Lack of IV contrast limits assessment for focal hepatic lesion, allowing for this, no focal lesion is seen. Gallstones are seen within the gallbladder which is physiologically distended. Small amount perihepatic ascites, the degree of which is slightly increased from prior exam. Spleen is normal in size, development of small volume of perisplenic ascites.  Mesenteric and omental edema and ill-defined soft tissue omental densities consistent with known peritoneal disease. Soft tissue nodule in the midline upper abdomen measures 2.8 x 2.2 cm, previously 3.5 x 2.3 cm.  Small and large bowel are suboptimally assessed given lack contrast and haziness throughout the abdomen. The stomach through is distended with fluid, small amount of fluid in the distal esophagus is small hiatal hernia. Enteric anastomosis in the mid to left abdomen. Bowel loops in the right lower quadrant of the abdomen are suboptimally defined. There is hyperattenuation within a tubular structure in the right lower quadrant that may reflect the appendix, however neoplasm is not entirely excluded. There is increased soft tissue density/fluid and edema about this structure which may reflect increased ascites. Edema and fluid tracks in the right inguinal canal with possible peritoneal implant in the inguinal canal. There are multiple colonic diverticula containing retained barium from prior  exams.  No adrenal nodule. Pancreas is normal. There is no hydronephrosis, kidneys are symmetric in size. Questionable soft tissue density in the region of the porta, may reflect periportal lymph nodes.  Dense atherosclerosis of the abdominal aorta without aneurysm. Small retroperitoneal soft tissue densities.  In the pelvis the bladder is physiologically distended. Prostate gland is normal in size. Small amount pelvic ascites.  No lytic or blastic osseous lesions. Degenerative change in the spine.  IMPRESSION: 1. Increased intra-abdominal ascites in the right and left upper quadrant and right lower quadrant, the degree of which remains mild. There is increased peritoneal and omental edema of the right lower quadrant. This edema and soft tissue density tracks into the right inguinal canal which may reflect progression of known metastatic disease, versus decompressed small bowel tracking in the right inguinal hernia. No proximal small bowel dilatation to suggest obstruction. 2. There is however interval improvement in size of dominant omental nodule in the anterior upper abdomen from prior. 3. Tubular structure in the right lower quadrant containing high-density. This high-density was seen on prior exam, it is unclear whether this represents the appendix with retained barium versus related to neoplasm or metastatic disease. There is overall increase in the fluid and soft tissue in the right lower quadrant, in addition adjacent to these tubular structure, however the appearance does not favor that of appendicitis. 4. Cholelithiasis without findings of acute cholecystitis. Diverticulosis without diverticulitis.   Electronically Signed   By: Jeb Levering M.D.   On: 05/06/2015 00:17   Dg Abd 1 View  05/10/2015   CLINICAL DATA:  Abdominal distention. 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  EXAM: ABDOMEN - 1 VIEW  COMPARISON:  Abdomen and pelvis CT dated 05/05/2015.  FINDINGS: Mildly dilated small bowel  loops in the left upper abdomen. Normal caliber gas filled right and transverse colon. Unremarkable bones.  IMPRESSION: Mild proximal small bowel ileus or partial obstruction.   Electronically Signed   By: Claudie Revering M.D.   On: 05/10/2015 11:47   US Scrotum  05/17/2015   CLINICAL DATA:  81 year old male with testicular swelling.  EXAM: ULTRASOUND OF SCROTUM  TECHNIQUE: Complete ultrasound examination of the testicles, epididymis, and other scrotal structures was performed.  COMPARISON:  None.  FINDINGS: Right testicle  Measurements: 4.1 x 2.8 x 3.1 cm. No mass or microlithiasis visualized. Symmetric blood flow with the left testis.  Left testicle  Measurements: 4.5 x 3.0 x 2.8 cm. No mass or microlithiasis visualized. Symmetric blood flow with the right testis.  Right epididymis:  Normal in size and appearance.  Left epididymis:  Normal in size and appearance.  Hydrocele:  Small bilateral hydroceles, right greater than left.  Varicocele:  None visualized.  Other: There is diffuse symmetric edema of the scrotal skin without focal fluid collection.  IMPRESSION: 1. Diffuse soft tissue edema of the scrotal skin. No focal fluid collection or abscess. 2. Small bilateral hydroceles, right greater than left. 3. Normal appearance of both testis and epididymis.   Electronically Signed   By: Jeb Levering M.D.   On: 05/17/2015 21:42   Ct Abdomen Pelvis W Contrast  05/13/2015   CLINICAL DATA:  Right lower quadrant pain. History of gist tumor. Nausea, abdominal distention and diarrhea.  EXAM: CT ABDOMEN AND PELVIS WITH CONTRAST  TECHNIQUE: Multidetector CT imaging of the abdomen and pelvis was performed using the standard protocol following bolus administration of intravenous contrast.  CONTRAST:  116mL OMNIPAQUE IOHEXOL 300 MG/ML  SOLN  COMPARISON:  05/05/2015  FINDINGS: Lower chest: There are bilateral pleural effusions left greater night. New from previous exam. Overlying compressive type atelectasis/consolidation  noted.  Hepatobiliary: No focal liver abnormality identified. The gallbladder is unremarkable. There is no biliary dilatation.  Pancreas: Negative  Spleen: Normal appearance of the spleen.  Adrenals/Urinary Tract: Normal adrenal glands. Normal appearance of the kidneys. Urinary bladder is collapsed chronic Foley catheter balloon.  Stomach/Bowel: Small hiatal hernia. The stomach is otherwise unremarkable. Mild increase caliber of the small bowel loops which measure up to 3.7 cm. There are areas of small bowel wall thickening noted, image number 43/series 2 Enteric contrast material is identified within the small bowel loops as well as the colon up to the level of the rectum. There is a large right inguinal hernia which contains fluid and a nonobstructed loop of small bowel.  Vascular/Lymphatic: Calcified atherosclerotic disease involves the abdominal aorta. No aneurysm. No retroperitoneal adenopathy identified. There is no pelvic or inguinal adenopathy.  Reproductive: Prostate gland and seminal vesicles are unremarkable.  Other: Within the right lower quadrant of the abdomen in the area of the previously noted distended tubular structure there is a multi loculated cystic mass which measures 6.4 x 6.2 by 4.0 cm. Again noted is abdominal and pelvic ascites. On today's study this appears more loculated. For example, along the inferior surface of the right lobe of liver there is loculated fluid, image number 38/series 2.  Musculoskeletal: No aggressive lytic or sclerotic bone lesions.  IMPRESSION: 1. Multi loculated cystic mass is identified within the right lower quadrant  of the abdomen and is new from previous exam. This is worrisome for abscess which may be the sequelae of a perforated appendix. 2. There is a persistent ascites within the abdomen and pelvis which now appears more loculated and suggestive of peritonitis. 3. Increase caliber of small bowel loops worrisome for enteritis and ileus. Additionally, there is  a right inguinal hernia which contains a loop of small bowel. This could be partially obstructed. There is no evidence for high-grade bowel obstruction however. 4. Critical Value/emergent results were called by telephone at the time of interpretation on 05/13/2015 at 1:10 pm to Dr. [name] , who verbally acknowledged these results.   Electronically Signed   By: Kerby Moors M.D.   On: 05/13/2015 13:13   US Renal  05/07/2015   CLINICAL DATA:  Urinary retention.  EXAM: RENAL / URINARY TRACT ULTRASOUND COMPLETE  COMPARISON:  CT scan abdomen dated 05/05/2015  FINDINGS: Right Kidney:  Length: 10.9 cm. Echogenicity within normal limits. No mass or hydronephrosis visualized.  Left Kidney:  Length: 11.5 cm. Echogenicity within normal limits. No mass or hydronephrosis visualized.  Bladder:  The bladder is empty with a Foley catheter in place.  IMPRESSION: Normal kidneys.  No hydronephrosis or other abnormality.   Electronically Signed   By: Lorriane Shire M.D.   On: 05/07/2015 17:27   US Abdomen Limited  05/06/2015   CLINICAL DATA:  Abdominal pain.  Evaluate for pocket of ascites.  EXAM: LIMITED ABDOMEN ULTRASOUND FOR ASCITES  TECHNIQUE: Limited ultrasound survey for ascites was performed in all four abdominal quadrants.  COMPARISON:  CT abdomen 05/05/2015.  FINDINGS: A drainable pocket of ascites was not identified in any of the 4 quadrants.  IMPRESSION: As above.   Electronically Signed   By: Rolla Flatten M.D.   On: 05/06/2015 12:45   Ct Image Guided Drainage By Percutaneous Catheter  05/13/2015   CLINICAL DATA:  Right lower quadrant perforated appendicitis with abscess  EXAM: CT GUIDED DRAINAGE OF RIGHT LOWER QUADRANT APPENDICEAL ABSCESS  ANESTHESIA/SEDATION: 1.0 Mg IV Versed 25 mcg IV Fentanyl  Total Moderate Sedation Time:  15 minutes  PROCEDURE: The procedure, risks, benefits, and alternatives were explained to the patient. Questions regarding the procedure were encouraged and answered. The patient understands  and consents to the procedure.  The right lower quadrant was prepped with Betadinein a sterile fashion, and a sterile drape was applied covering the operative field. A sterile gown and sterile gloves were used for the procedure. Local anesthesia was provided with 1% Lidocaine.  Previous imaging reviewed. Patient positioned supine. Noncontrast localization CT performed through the lower abdomen. The right lower quadrant loculated abscess was localized. Under sterile conditions and local anesthesia, an 18 gauge 10 cm access needle was advanced from a lateral approach into the collection. Syringe aspiration yielded purulent fluid compatible with abscess. Amplatz guidewire inserted followed by tract dilatation to advance a 10 [place] drain. Retention loop formed in the abscess cavity and confirmed with CT. Syringe aspiration yielded 23 cc bloody purulent fluid. Sample sent for Gram stain and culture. Catheter secured with a Prolene suture and connected to external suction bulb. Sterile dressing applied.  COMPLICATIONS: None immediate  FINDINGS: Imaging confirms needle placement in the right lower quadrant loculated abscess for drainage  IMPRESSION: Successful CT-guided right lower quadrant appendiceal abscess drain insertion.   Electronically Signed   By: Jerilynn Mages.  Shick M.D.   On: 05/13/2015 16:23     Medical Consultants:    Oncology  Surgery  Anti-Infectives:  Zosyn 05/05/15--->05/11/15  Rocephin 05/11/15--->  Flagyl 05/10/15--->  Subjective:   Beau Fanny  No nausea or vomiting. Abdominal pain improved. Cheerful, family around. No diarrhea today.   Objective:    Filed Vitals:   05/18/15 2044 05/19/15 0509 05/19/15 1024 05/19/15 1343  BP: 143/53 126/59 129/43 116/53  Pulse: 91 92  77  Temp: 98.8 F (37.1 C) 98.8 F (37.1 C)  98.7 F (37.1 C)  TempSrc: Oral Oral  Oral  Resp: 20 18  16   Height:      Weight:  80.015 kg (176 lb 6.4 oz)    SpO2: 98% 99%  99%    Intake/Output Summary (Last 24  hours) at 05/19/15 1846 Last data filed at 05/19/15 1635  Gross per 24 hour  Intake 604.83 ml  Output    492 ml  Net 112.83 ml    Exam: Gen:  NAD Cardiovascular:  Tachy, No M/R/G Respiratory:  Lungs CTAB Gastrointestinal:  Abdomen distended,no tenderness. , +BS Extremities: upper extremity swelling much improved.    Data Reviewed:    Labs: Basic Metabolic Panel:  Recent Labs Lab 05/14/15 0430 05/17/15 0553  NA 140 139  K 4.4 3.7  CL 111 110  CO2 23 23  GLUCOSE 99 107*  BUN 22* 16  CREATININE 1.08 1.04  CALCIUM 7.6* 7.5*   GFR Estimated Creatinine Clearance: 53.6 mL/min (by C-G formula based on Cr of 1.04). Liver Function Tests:  Recent Labs Lab 05/14/15 0430  AST 20  ALT 23  ALKPHOS 69  BILITOT 0.9  PROT 5.2*  ALBUMIN 2.1*   No results for input(s): LIPASE, AMYLASE in the last 168 hours. Coagulation profile No results for input(s): INR, PROTIME in the last 168 hours.  CBC:  Recent Labs Lab 05/14/15 0430 05/16/15 0410 05/17/15 0553  WBC 12.6* 10.6* 9.5  HGB 8.7* 8.5* 8.3*  HCT 26.1* 26.4* 25.7*  MCV 104.4* 106.0* 103.6*  PLT 379 462* 467*   BNP (last 3 results)  Recent Labs  09/12/14 0445  PROBNP 5726.0*   Sepsis Labs:  Recent Labs Lab 05/14/15 0430 05/16/15 0410 05/17/15 0553  WBC 12.6* 10.6* 9.5   Microbiology Recent Results (from the past 240 hour(s))  Clostridium Difficile by PCR     Status: Abnormal   Collection Time: 05/10/15 11:00 AM  Result Value Ref Range Status   C difficile by pcr POSITIVE (A) NEGATIVE Final    Comment: CRITICAL RESULT CALLED TO, READ BACK BY AND VERIFIED WITH: I HABIB AT 1340 ON 05.22.2016 BY NBROOKS   Culture, routine-abscess     Status: None   Collection Time: 05/13/15  5:08 PM  Result Value Ref Range Status   Specimen Description ABSCESS  Final   Special Requests Normal  Final   Gram Stain   Final    ABUNDANT WBC PRESENT, PREDOMINANTLY PMN NO SQUAMOUS EPITHELIAL CELLS SEEN RARE GRAM  POSITIVE COCCI IN PAIRS Performed at [facility]    Culture   Final    MULTIPLE ORGANISMS PRESENT, NONE PREDOMINANT Note: NO STAPHYLOCOCCUS AUREUS ISOLATED NO GROUP A STREP (S.PYOGENES) ISOLATED Performed at [facility]    Report Status 05/16/2015 FINAL  Final  Anaerobic culture     Status: None   Collection Time: 05/13/15  5:09 PM  Result Value Ref Range Status   Specimen Description ABSCESS  Final   Special Requests Normal  Final   Gram Stain   Final    ABUNDANT WBC PRESENT, PREDOMINANTLY PMN NO SQUAMOUS EPITHELIAL CELLS SEEN  RARE GRAM POSITIVE COCCI IN PAIRS Performed at [facility]    Culture   Final    NO ANAEROBES ISOLATED Performed at [facility]    Report Status 05/18/2015 FINAL  Final     Medications:   . amLODipine  5 mg Oral Daily  . cefTRIAXone (ROCEPHIN)  IV  1 g Intravenous Q24H  . imatinib  400 mg Oral Q breakfast  . metoprolol tartrate  25 mg Oral BID  . metroNIDAZOLE  500 mg Oral 3 times per day  . saccharomyces boulardii  250 mg Oral BID  . simvastatin  20 mg Oral QPM  . sodium chloride  3 mL Intravenous Q12H   Continuous Infusions: . 0.9 % NaCl with KCl 40 mEq / L 10 mL/hr (05/18/15 2249)    Time spent: 25 minutes.   LOS: 13 days   [facility] Pager [phone]   If 7PM-7AM, please contact night-coverage at [email or web address], password [email or web address] for any overnight needs.  05/19/2015, 6:46 PM

## 2015-05-19 NOTE — Progress Notes (Signed)
Patient ID: [name] [name], male   DOB: January 30, 1929, 81 y.o.   MRN: [id number]    Referring Physician(s): CCS  Subjective: Pt doing well; no new c/o; denies sig abd pain,N/V; anxious to go home; tol diet ok; having more formed BM's; ambulating ok  Allergies: Review of patient's allergies indicates no known allergies.  Medications: Prior to Admission medications   Medication Sig Start Date End Date Taking? Authorizing Provider  amLODipine (NORVASC) 5 MG tablet Take 5 mg by mouth daily. 03/17/15  Yes Historical Provider, MD  hydrochlorothiazide (HYDRODIURIL) 25 MG tablet Take 25 mg by mouth daily.   Yes Historical Provider, MD  imatinib (GLEEVEC) 400 MG tablet Take 1 tablet (400 mg total) by mouth daily. Take with meals and large glass of water.Caution:Chemotherapy. 02/13/15  Yes Ladell Pier, MD  metoprolol tartrate (LOPRESSOR) 25 MG tablet Take 1 tablet (25 mg total) by mouth 2 (two) times daily. 03/06/13  Yes Costin Karlyne Greenspan, MD  simvastatin (ZOCOR) 20 MG tablet Take 20 mg by mouth every evening.   Yes Historical Provider, MD     Vital Signs: BP 126/59 mmHg  Pulse 92  Temp(Src) 98.8 F (37.1 C) (Oral)  Resp 18  Ht 5\' 7"  (1.702 m)  Wt 176 lb 6.4 oz (80.015 kg)  BMI 27.62 kg/m2  SpO2 99%  Physical Exam RLQ drain intact, insertion site ok, NT, output 15 cc's turbid, light brown fluid; cx's- mult org  Imaging: US Scrotum  05/17/2015   CLINICAL DATA:  81 year old male with testicular swelling.  EXAM: ULTRASOUND OF SCROTUM  TECHNIQUE: Complete ultrasound examination of the testicles, epididymis, and other scrotal structures was performed.  COMPARISON:  None.  FINDINGS: Right testicle  Measurements: 4.1 x 2.8 x 3.1 cm. No mass or microlithiasis visualized. Symmetric blood flow with the left testis.  Left testicle  Measurements: 4.5 x 3.0 x 2.8 cm. No mass or microlithiasis visualized. Symmetric blood flow with the right testis.  Right epididymis:  Normal in size and appearance.  Left  epididymis:  Normal in size and appearance.  Hydrocele:  Small bilateral hydroceles, right greater than left.  Varicocele:  None visualized.  Other: There is diffuse symmetric edema of the scrotal skin without focal fluid collection.  IMPRESSION: 1. Diffuse soft tissue edema of the scrotal skin. No focal fluid collection or abscess. 2. Small bilateral hydroceles, right greater than left. 3. Normal appearance of both testis and epididymis.   Electronically Signed   By: Jeb Levering M.D.   On: 05/17/2015 21:42    Labs:  CBC:  Recent Labs  05/11/15 0546 05/14/15 0430 05/16/15 0410 05/17/15 0553  WBC 10.0 12.6* 10.6* 9.5  HGB 9.3* 8.7* 8.5* 8.3*  HCT 27.5* 26.1* 26.4* 25.7*  PLT 230 379 462* 467*    COAGS:  Recent Labs  10/16/14 1626 10/21/14 0820 01/13/15 0100 05/06/15 0304  INR 1.09 1.07 1.05 1.14  APTT  --  29  --   --     BMP:  Recent Labs  05/10/15 0537 05/11/15 0546 05/14/15 0430 05/17/15 0553  NA 143 141 140 139  K 4.2 4.1 4.4 3.7  CL 113* 110 111 110  CO2 23 24 23 23   GLUCOSE 113* 114* 99 107*  BUN 28* 28* 22* 16  CALCIUM 8.6* 8.3* 7.6* 7.5*  CREATININE 1.19 1.18 1.08 1.04  GFRNONAA 54* 55* >60 >60  GFRAA >60 >60 >60 >60    LIVER FUNCTION TESTS:  Recent Labs  03/11/15 0907 04/09/15 0900 05/05/15 2137  05/14/15 0430  BILITOT 0.90 0.55 1.0 0.9  AST 24 26 26 20   ALT 34 34 27 23  ALKPHOS 98 79 84 69  PROT 6.5 6.3* 7.1 5.2*  ALBUMIN 3.5 3.6 4.1 2.1*    Assessment and Plan: S/p drainage of RLQ ?appendiceal abscess 5/25; hx GIST ; afebrile ;WBC nl; rec f/u CT with drain injection prior to removal- if fistula noted change from bulb to bag drain; other plans as per CCS; if pt goes home with drain rec once daily flushing with 5 cc's sterile NS, recording of output and dressing changes every 2-3 days; can f/u in drain clinic [phone] next week if desired     Signed: D. Rowe Robert 05/19/2015, 9:25 AM   I spent a total of 15 minutes in face to  face in clinical consultation/evaluation, greater than 50% of which was counseling/coordinating care for RLQ abscess drain

## 2015-05-20 ENCOUNTER — Other Ambulatory Visit: Payer: Self-pay | Admitting: Radiology

## 2015-05-20 MED ORDER — FUROSEMIDE 20 MG PO TABS: 20.0000 mg | ORAL_TABLET | Freq: Every day | ORAL | Status: DC

## 2015-05-20 MED ORDER — SACCHAROMYCES BOULARDII 250 MG PO CAPS: 250.0000 mg | ORAL_CAPSULE | Freq: Two times a day (BID) | ORAL | Status: DC

## 2015-05-20 MED ORDER — TRAZODONE HCL 50 MG PO TABS: 50.0000 mg | ORAL_TABLET | Freq: Every evening | ORAL | Status: DC | PRN

## 2015-05-20 MED ORDER — METRONIDAZOLE 500 MG PO TABS: 500.0000 mg | ORAL_TABLET | Freq: Three times a day (TID) | ORAL | Status: DC

## 2015-05-20 MED ORDER — FUROSEMIDE 10 MG/ML IJ SOLN
40.0000 mg | Freq: Once | INTRAMUSCULAR | Status: AC
  Administered 2015-05-20: 40 mg via INTRAVENOUS
  Filled 2015-05-20: qty 4

## 2015-05-20 NOTE — Discharge Summary (Signed)
Physician Discharge Summary  [name] [name] FXT:[id number] DOB: [birth year] DOA: 05/05/2015  PCP: Abigail Miyamoto, MD  Admit date: 05/05/2015 Discharge date: 05/20/2015  Time spent: 30 minutes  Recommendations for Outpatient Follow-up:  1. Follow up with surgery as recommended.   Discharge Diagnoses:  Principal Problem:   SIRS (systemic inflammatory response syndrome) Active Problems:   Chronic anemia   Acute kidney failure   GIST (gastrointestinal stromal tumor) of small bowel, malignant   Hyperlipidemia   Hypertension   Abdominal carcinomatosis   Abdominal pain   Epigastric pain   CKD (chronic kidney disease), stage III   Urinary retention   C. difficile diarrhea   Abdominal abscess   Discharge Condition: improved  Diet recommendation: regular diet.   Filed Weights   05/18/15 0502 05/19/15 0509 05/20/15 0653  Weight: 79.788 kg (175 lb 14.4 oz) 80.015 kg (176 lb 6.4 oz) 79.652 kg (175 lb 9.6 oz)    History of present illness:  [name] [name] is an 81 y.o. male with h/o GIST tumor, peritoneal carcinomatosis, being treated with [facility] who was admitted 05/05/15 with a chief complaint of abdominal pain associated with nausea. A CT scan, done on admission, showed possible findings consistent with appendicitis versus progressive abdominal carcinomatosis. The patient was placed on empiric Zosyn. He had significant abdominal distention and on 05/10/15 developed diarrhea with subsequent C. difficile testing positive. Flagyl was started at that time. General surgery continues to follow for possible appendicitis, which is being treated medically with antibiotics. Oncology also following. Repeat CT scan ordered for 05/13/15 by surgeon showed loculated fluid collection, underwent IR drain placement and bloody pus drained out. His abdominal pain is much improved after the drain was placed. Recommend continue the drain and IR is following.  Hospital Course:  Sepsis vs SIRS secondary to appendicitis  versus abdominal carcinomatosis versus C. difficile colitis with associated abdominal pain - Status post vigorous hydration in the ED. Blood cultures negative to date. - IR consulted for paracentesis to rule out SBP. Only trace ascites, so could not be done. - Initially treated with empiric zosyn for appendicitis although not entirely clear based on CT findings.  - Narrowed to Rocephin 05/11/15 given C diff +. - Repeat CT scan ordered for 05/13/15 by surgeon showed loculated fluid collection, underwent IR drain placement and bloody pus drained out. His abdominal pain is much improved after the drain was placed. Recommend continue the drain and IR is following. When the amount of drain is less than 15 ml/day, plan to follow up with a repeat CT scan. Follow up with abscess drain cultures. Prelim reports shows multiple bacteria, no dominant bacteria seen. Plan to d/c rocephin as her cultures are negative.  Repeat CT abdomen ordered and showed improvement in the abscess..   Active Problems:  C. Diff diarrhea - The patient developed explosive diarrhea 05/10/15. C. difficile positive.  - Continue Flagyl to complete the course.. Contact isolation.- pt had 2 bm, last night.    Urinary retention, likely secondary to pain medication adverse effect versus tumor causing obstruction of urethra - Foley catheter placed 05/07/15. - Renal ultrasound negative for hydronephrosis.   Chronic anemia, multifactorial with recent ABLA, anemia of chronic disease contributory - Hemoglobin drop likely dilutional. Baseline hemoglobin 10.9. Current hemoglobin stable at 8.3.recommend PRBC transfusion when hemoglobin is less than 7.    Acute kidney failure, pre-renal from dehydration in the setting of stage III CKD - Renal function improved with IVF. Baseline creatinine 1.2. Creatinine back to usual baseline  values.   GIST (gastrointestinal stromal tumor) of small bowel, malignant - Dr. [name] following,  recommendations to continue Gleevac as it might recurr.    Hypertension - Continue metoprolol and Norvasc.    Hyperlipidemia - Continue Zocor.  Fluid overload, scrotal swelling and pedal edema: getting IV lasix as needed.  Get scrotal US shows diffuse soft tissue edema of the scrotal skin.no abscess seen. Small bilateral hydroceles. Plan to discharge patient on low dose lasix to help with fluid overload.   Right eye redness:  Probably allergic in nature, reports some itching, which has resolved.    Procedures:  Ct ABDOMEN AND PELVIS.   Ir DRAIN   Consultations: Surgery IR  Discharge Exam: Filed Vitals:   05/20/15 0653  BP: 132/54  Pulse: 90  Temp: 98.8 F (37.1 C)  Resp: 18    General: alert afebrile comfortable Cardiovascular:s1s2 Respiratory: ctab  Discharge Instructions   Discharge Instructions    Diet - low sodium heart healthy    Complete by:  As directed      Discharge instructions    Complete by:  As directed   Follow up with surgery and IR ( radiology) as recommended.          Discharge Medication List as of 05/20/2015  1:49 PM    START taking these medications   Details  furosemide (LASIX) 20 MG tablet Take 1 tablet (20 mg total) by mouth daily., Starting 05/20/2015, Until Discontinued, Print    metroNIDAZOLE (FLAGYL) 500 MG tablet Take 1 tablet (500 mg total) by mouth every 8 (eight) hours., Starting 05/20/2015, Until Discontinued, Print    saccharomyces boulardii (FLORASTOR) 250 MG capsule Take 1 capsule (250 mg total) by mouth 2 (two) times daily., Starting 05/20/2015, Until Discontinued, Print    traZODone (DESYREL) 50 MG tablet Take 1 tablet (50 mg total) by mouth at bedtime as needed for sleep., Starting 05/20/2015, Until Discontinued, Print      CONTINUE these medications which have NOT CHANGED   Details  amLODipine (NORVASC) 5 MG tablet Take 5 mg by mouth daily., Starting 03/17/2015, Until Discontinued, Historical Med    imatinib (GLEEVEC)  400 MG tablet Take 1 tablet (400 mg total) by mouth daily. Take with meals and large glass of water.Caution:Chemotherapy., Starting 02/13/2015, Until Discontinued, Normal    metoprolol tartrate (LOPRESSOR) 25 MG tablet Take 1 tablet (25 mg total) by mouth 2 (two) times daily., Starting 03/06/2013, Until Discontinued, Print    simvastatin (ZOCOR) 20 MG tablet Take 20 mg by mouth every evening., Until Discontinued, Historical Med      STOP taking these medications     hydrochlorothiazide (HYDRODIURIL) 25 MG tablet        No Known Allergies Follow-up Information    Follow up with [name], MD. Go on 06/10/2015.   Specialty:  General Surgery   Why:  at 3:15pm   For Post Hospitalization Follow Up., Arrive 15 minutes prior to appointment, [employer], Photo ID, and co-pay   Contact information:   [address] [place] [address] [phone]        The results of significant diagnostics from this hospitalization (including imaging, microbiology, ancillary and laboratory) are listed below for reference.    Significant Diagnostic Studies: Ct Abdomen Pelvis Wo Contrast  05/06/2015   CLINICAL DATA:  Mid right-sided abdominal pain, onset tonight. Nausea without vomiting. History of GI stromal tumor of small bowel.  EXAM: CT ABDOMEN AND PELVIS WITHOUT CONTRAST  TECHNIQUE: Multidetector  CT imaging of the abdomen and pelvis was performed following the standard protocol without IV contrast.  COMPARISON:  CT 01/13/2015  FINDINGS: Resolution of previous left pleural effusion. Depending ground-glass opacity in the right lower lobe consistent with atelectasis. The heart is enlarged.  Lack of IV contrast limits assessment for focal hepatic lesion, allowing for this, no focal lesion is seen. Gallstones are seen within the gallbladder which is physiologically distended. Small amount perihepatic ascites, the degree of which is slightly increased from prior exam. Spleen is normal in  size, development of small volume of perisplenic ascites.  Mesenteric and omental edema and ill-defined soft tissue omental densities consistent with known peritoneal disease. Soft tissue nodule in the midline upper abdomen measures 2.8 x 2.2 cm, previously 3.5 x 2.3 cm.  Small and large bowel are suboptimally assessed given lack contrast and haziness throughout the abdomen. The stomach through is distended with fluid, small amount of fluid in the distal esophagus is small hiatal hernia. Enteric anastomosis in the mid to left abdomen. Bowel loops in the right lower quadrant of the abdomen are suboptimally defined. There is hyperattenuation within a tubular structure in the right lower quadrant that may reflect the appendix, however neoplasm is not entirely excluded. There is increased soft tissue density/fluid and edema about this structure which may reflect increased ascites. Edema and fluid tracks in the right inguinal canal with possible peritoneal implant in the inguinal canal. There are multiple colonic diverticula containing retained barium from prior exams.  No adrenal nodule. Pancreas is normal. There is no hydronephrosis, kidneys are symmetric in size. Questionable soft tissue density in the region of the porta, may reflect periportal lymph nodes.  Dense atherosclerosis of the abdominal aorta without aneurysm. Small retroperitoneal soft tissue densities.  In the pelvis the bladder is physiologically distended. Prostate gland is normal in size. Small amount pelvic ascites.  No lytic or blastic osseous lesions. Degenerative change in the spine.  IMPRESSION: 1. Increased intra-abdominal ascites in the right and left upper quadrant and right lower quadrant, the degree of which remains mild. There is increased peritoneal and omental edema of the right lower quadrant. This edema and soft tissue density tracks into the right inguinal canal which may reflect progression of known metastatic disease, versus  decompressed small bowel tracking in the right inguinal hernia. No proximal small bowel dilatation to suggest obstruction. 2. There is however interval improvement in size of dominant omental nodule in the anterior upper abdomen from prior. 3. Tubular structure in the right lower quadrant containing high-density. This high-density was seen on prior exam, it is unclear whether this represents the appendix with retained barium versus related to neoplasm or metastatic disease. There is overall increase in the fluid and soft tissue in the right lower quadrant, in addition adjacent to these tubular structure, however the appearance does not favor that of appendicitis. 4. Cholelithiasis without findings of acute cholecystitis. Diverticulosis without diverticulitis.   Electronically Signed   By: Jeb Levering M.D.   On: 05/06/2015 00:17   Dg Abd 1 View  05/10/2015   CLINICAL DATA:  Abdominal distention. 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  EXAM: ABDOMEN - 1 VIEW  COMPARISON:  Abdomen and pelvis CT dated 05/05/2015.  FINDINGS: Mildly dilated small bowel loops in the left upper abdomen. Normal caliber gas filled right and transverse colon. Unremarkable bones.  IMPRESSION: Mild proximal small bowel ileus or partial obstruction.   Electronically Signed   By: Percell Locus.D.  On: 05/10/2015 11:47   US Scrotum  05/17/2015   CLINICAL DATA:  81 year old male with testicular swelling.  EXAM: ULTRASOUND OF SCROTUM  TECHNIQUE: Complete ultrasound examination of the testicles, epididymis, and other scrotal structures was performed.  COMPARISON:  None.  FINDINGS: Right testicle  Measurements: 4.1 x 2.8 x 3.1 cm. No mass or microlithiasis visualized. Symmetric blood flow with the left testis.  Left testicle  Measurements: 4.5 x 3.0 x 2.8 cm. No mass or microlithiasis visualized. Symmetric blood flow with the right testis.  Right epididymis:  Normal in size and appearance.  Left epididymis:  Normal in size and  appearance.  Hydrocele:  Small bilateral hydroceles, right greater than left.  Varicocele:  None visualized.  Other: There is diffuse symmetric edema of the scrotal skin without focal fluid collection.  IMPRESSION: 1. Diffuse soft tissue edema of the scrotal skin. No focal fluid collection or abscess. 2. Small bilateral hydroceles, right greater than left. 3. Normal appearance of both testis and epididymis.   Electronically Signed   By: Jeb Levering M.D.   On: 05/17/2015 21:42   Ct Abdomen Pelvis W Contrast  05/19/2015   CLINICAL DATA:  Recent percutaneous drainage of a multilocular abscess in the right lower quadrant of the abdomen. Followup.  EXAM: CT ABDOMEN AND PELVIS WITH CONTRAST  TECHNIQUE: Multidetector CT imaging of the abdomen and pelvis was performed using the standard protocol following bolus administration of intravenous contrast.  CONTRAST:  15mL OMNIPAQUE IOHEXOL 300 MG/ML IV. Oral contrast was not administered.  COMPARISON:  05/13/2015 and earlier.  FINDINGS: Interval essential resolution of the previously drained abscess in the right upper pelvis. The drained abscess is immediately adjacent to an abnormal loop of thick-walled small bowel which appears to be contained within an internal hernia, the opening of which is best seen on the sagittal reformatted images; on the axial images, the mesenteric vessels are tightly clustered at the entrance of the internal hernia. In addition, a right inguinal hernia is present, containing a decompressed loop of small bowel and a small amount of ascites. Moderate ascites persists in the abdomen and pelvis, slightly increased in amount since the examination 6 days ago.  Normal-appearing liver, spleen, pancreas, adrenal glands and kidneys. High attenuation sludge within the gallbladder which is relatively contracted. No biliary ductal dilation. 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 No significant lymphadenopathy.  Small hiatal hernia.  Stomach otherwise unremarkable. Mild wall thickening involving multiple loops of small bowel as noted previously without evidence of bowel obstruction. Entire colon relatively decompressed, containing liquid stool. Residual contrast material within diverticula in the cecum, distal descending colon, and sigmoid colon.  Diffuse body wall edema, worse than on the examination 6 days ago. Edema involving the visualized scrotum, unchanged. Moderate-sized left pleural effusion and small right pleural effusion, unchanged. Associated passive atelectasis in the lower lobes, unchanged.  IMPRESSION: 1. Essential resolution of the previously drained abscess in the right upper pelvis. 2. The drained abscess is immediately adjacent to an abnormal loop of thick-walled small bowel which I believe is contained within an internal hernia. 3. Right inguinal hernia containing a decompressed loop of small bowel and a small amount of ascites. 4. Moderate abdominal and pelvic ascites, increased since the CT 6 days ago. 5. Severe anasarca, worse than on the examination 6 days ago. 6. Gallbladder sludge, unchanged. 7. Small hiatal hernia. 8. Stable moderate-sized left pleural effusion and small right pleural effusion with associated passive atelectasis in the lower lobes.   Electronically Signed  By: Evangeline Dakin M.D.   On: 05/19/2015 19:35   Ct Abdomen Pelvis W Contrast  05/13/2015   CLINICAL DATA:  Right lower quadrant pain. History of gist tumor. Nausea, abdominal distention and diarrhea.  EXAM: CT ABDOMEN AND PELVIS WITH CONTRAST  TECHNIQUE: Multidetector CT imaging of the abdomen and pelvis was performed using the standard protocol following bolus administration of intravenous contrast.  CONTRAST:  175mL OMNIPAQUE IOHEXOL 300 MG/ML  SOLN  COMPARISON:  05/05/2015  FINDINGS: Lower chest: There are bilateral pleural effusions left greater night. New from previous exam. Overlying compressive type atelectasis/consolidation noted.   Hepatobiliary: No focal liver abnormality identified. The gallbladder is unremarkable. There is no biliary dilatation.  Pancreas: Negative  Spleen: Normal appearance of the spleen.  Adrenals/Urinary Tract: Normal adrenal glands. Normal appearance of the kidneys. Urinary bladder is collapsed chronic Foley catheter balloon.  Stomach/Bowel: Small hiatal hernia. The stomach is otherwise unremarkable. Mild increase caliber of the small bowel loops which measure up to 3.7 cm. There are areas of small bowel wall thickening noted, image number 43/series 2 Enteric contrast material is identified within the small bowel loops as well as the colon up to the level of the rectum. There is a large right inguinal hernia which contains fluid and a nonobstructed loop of small bowel.  Vascular/Lymphatic: Calcified atherosclerotic disease involves the abdominal aorta. No aneurysm. No retroperitoneal adenopathy identified. There is no pelvic or inguinal adenopathy.  Reproductive: Prostate gland and seminal vesicles are unremarkable.  Other: Within the right lower quadrant of the abdomen in the area of the previously noted distended tubular structure there is a multi loculated cystic mass which measures 6.4 x 6.2 by 4.0 cm. Again noted is abdominal and pelvic ascites. On today's study this appears more loculated. For example, along the inferior surface of the right lobe of liver there is loculated fluid, image number 38/series 2.  Musculoskeletal: No aggressive lytic or sclerotic bone lesions.  IMPRESSION: 1. Multi loculated cystic mass is identified within the right lower quadrant of the abdomen and is new from previous exam. This is worrisome for abscess which may be the sequelae of a perforated appendix. 2. There is a persistent ascites within the abdomen and pelvis which now appears more loculated and suggestive of peritonitis. 3. Increase caliber of small bowel loops worrisome for enteritis and ileus. Additionally, there is a right  inguinal hernia which contains a loop of small bowel. This could be partially obstructed. There is no evidence for high-grade bowel obstruction however. 4. Critical Value/emergent results were called by telephone at the time of interpretation on 05/13/2015 at 1:10 pm to Dr. [name] , who verbally acknowledged these results.   Electronically Signed   By: Kerby Moors M.D.   On: 05/13/2015 13:13   US Renal  05/07/2015   CLINICAL DATA:  Urinary retention.  EXAM: RENAL / URINARY TRACT ULTRASOUND COMPLETE  COMPARISON:  CT scan abdomen dated 05/05/2015  FINDINGS: Right Kidney:  Length: 10.9 cm. Echogenicity within normal limits. No mass or hydronephrosis visualized.  Left Kidney:  Length: 11.5 cm. Echogenicity within normal limits. No mass or hydronephrosis visualized.  Bladder:  The bladder is empty with a Foley catheter in place.  IMPRESSION: Normal kidneys.  No hydronephrosis or other abnormality.   Electronically Signed   By: Lorriane Shire M.D.   On: 05/07/2015 17:27   US Abdomen Limited  05/06/2015   CLINICAL DATA:  Abdominal pain.  Evaluate for pocket of ascites.  EXAM: LIMITED ABDOMEN ULTRASOUND  FOR ASCITES  TECHNIQUE: Limited ultrasound survey for ascites was performed in all four abdominal quadrants.  COMPARISON:  CT abdomen 05/05/2015.  FINDINGS: A drainable pocket of ascites was not identified in any of the 4 quadrants.  IMPRESSION: As above.   Electronically Signed   By: Rolla Flatten M.D.   On: 05/06/2015 12:45   Ct Image Guided Drainage By Percutaneous Catheter  05/13/2015   CLINICAL DATA:  Right lower quadrant perforated appendicitis with abscess  EXAM: CT GUIDED DRAINAGE OF RIGHT LOWER QUADRANT APPENDICEAL ABSCESS  ANESTHESIA/SEDATION: 1.0 Mg IV Versed 25 mcg IV Fentanyl  Total Moderate Sedation Time:  15 minutes  PROCEDURE: The procedure, risks, benefits, and alternatives were explained to the patient. Questions regarding the procedure were encouraged and answered. The patient understands and  consents to the procedure.  The right lower quadrant was prepped with Betadinein a sterile fashion, and a sterile drape was applied covering the operative field. A sterile gown and sterile gloves were used for the procedure. Local anesthesia was provided with 1% Lidocaine.  Previous imaging reviewed. Patient positioned supine. Noncontrast localization CT performed through the lower abdomen. The right lower quadrant loculated abscess was localized. Under sterile conditions and local anesthesia, an 18 gauge 10 cm access needle was advanced from a lateral approach into the collection. Syringe aspiration yielded purulent fluid compatible with abscess. Amplatz guidewire inserted followed by tract dilatation to advance a 10 [place] drain. Retention loop formed in the abscess cavity and confirmed with CT. Syringe aspiration yielded 23 cc bloody purulent fluid. Sample sent for Gram stain and culture. Catheter secured with a Prolene suture and connected to external suction bulb. Sterile dressing applied.  COMPLICATIONS: None immediate  FINDINGS: Imaging confirms needle placement in the right lower quadrant loculated abscess for drainage  IMPRESSION: Successful CT-guided right lower quadrant appendiceal abscess drain insertion.   Electronically Signed   By: Jerilynn Mages.  Shick M.D.   On: 05/13/2015 16:23    Microbiology: Recent Results (from the past 240 hour(s))  Culture, routine-abscess     Status: None   Collection Time: 05/13/15  5:08 PM  Result Value Ref Range Status   Specimen Description ABSCESS  Final   Special Requests Normal  Final   Gram Stain   Final    ABUNDANT WBC PRESENT, PREDOMINANTLY PMN NO SQUAMOUS EPITHELIAL CELLS SEEN RARE GRAM POSITIVE COCCI IN PAIRS Performed at [facility]    Culture   Final    MULTIPLE ORGANISMS PRESENT, NONE PREDOMINANT Note: NO STAPHYLOCOCCUS AUREUS ISOLATED NO GROUP A STREP (S.PYOGENES) ISOLATED Performed at [facility]    Report Status 05/16/2015 FINAL   Final  Anaerobic culture     Status: None   Collection Time: 05/13/15  5:09 PM  Result Value Ref Range Status   Specimen Description ABSCESS  Final   Special Requests Normal  Final   Gram Stain   Final    ABUNDANT WBC PRESENT, PREDOMINANTLY PMN NO SQUAMOUS EPITHELIAL CELLS SEEN RARE GRAM POSITIVE COCCI IN PAIRS Performed at [facility]    Culture   Final    NO ANAEROBES ISOLATED Performed at [facility]    Report Status 05/18/2015 FINAL  Final     Labs: Basic Metabolic Panel:  Recent Labs Lab 05/14/15 0430 05/17/15 0553  NA 140 139  K 4.4 3.7  CL 111 110  CO2 23 23  GLUCOSE 99 107*  BUN 22* 16  CREATININE 1.08 1.04  CALCIUM 7.6* 7.5*   Liver Function  Tests:  Recent Labs Lab 05/14/15 0430  AST 20  ALT 23  ALKPHOS 69  BILITOT 0.9  PROT 5.2*  ALBUMIN 2.1*   No results for input(s): LIPASE, AMYLASE in the last 168 hours. No results for input(s): AMMONIA in the last 168 hours. CBC:  Recent Labs Lab 05/14/15 0430 05/16/15 0410 05/17/15 0553  WBC 12.6* 10.6* 9.5  HGB 8.7* 8.5* 8.3*  HCT 26.1* 26.4* 25.7*  MCV 104.4* 106.0* 103.6*  PLT 379 462* 467*   Cardiac Enzymes: No results for input(s): CKTOTAL, CKMB, CKMBINDEX, TROPONINI in the last 168 hours. BNP: BNP (last 3 results) No results for input(s): BNP in the last 8760 hours.  ProBNP (last 3 results)  Recent Labs  09/12/14 0445  PROBNP 5726.0*    CBG: No results for input(s): GLUCAP in the last 168 hours.     SignedHosie Poisson  [facility] 05/20/2015, 7:11 PM

## 2015-05-20 NOTE — Progress Notes (Signed)
[facility] Progress Note     Subjective: Pt continues to have no pain or N/V, tolerating soft diet.  Ambulating well.  Still has foley in place, wants that out.  He says he's going home today after drain removed.  Pt excited to sit outside in his car port and enjoy the outdoors.    Objective: Vital signs in last 24 hours: Temp:  [98.7 F (37.1 C)-99.1 F (37.3 C)] 98.8 F (37.1 C) (06/01 0653) Pulse Rate:  [77-99] 90 (06/01 0653) Resp:  [16-18] 18 (06/01 0653) BP: (116-132)/(43-54) 132/54 mmHg (06/01 0653) SpO2:  [96 %-99 %] 98 % (06/01 0653) Weight:  [79.652 kg (175 lb 9.6 oz)] 79.652 kg (175 lb 9.6 oz) (06/01 0653) Last BM Date: 05/19/15  Intake/Output from previous day: 05/31 0701 - 06/01 0700 In: 59 [P.O.:480; I.V.:240; IV Piggyback:50] Out: 807 [Urine:800; Drains:7] Intake/Output this shift:    PE: Gen:  Alert, NAD, pleasant Abd: Soft, NT, still distended, but less than admission, +BS, drain with minimal serous output, tissue in tubing   Lab Results:  No results for input(s): WBC, HGB, HCT, PLT in the last 72 hours. BMET No results for input(s): NA, K, CL, CO2, GLUCOSE, BUN, CREATININE, CALCIUM in the last 72 hours. PT/INR No results for input(s): LABPROT, INR in the last 72 hours. CMP     Component Value Date/Time   NA 139 05/17/2015 0553   NA 145 04/09/2015 0900   K 3.7 05/17/2015 0553   K 4.1 04/09/2015 0900   CL 110 05/17/2015 0553   CO2 23 05/17/2015 0553   CO2 27 04/09/2015 0900   GLUCOSE 107* 05/17/2015 0553   GLUCOSE 116 04/09/2015 0900   BUN 16 05/17/2015 0553   BUN 19.3 04/09/2015 0900   CREATININE 1.04 05/17/2015 0553   CREATININE 1.2 04/09/2015 0900   CREATININE 1.18 04/25/2013 1041   CALCIUM 7.5* 05/17/2015 0553   CALCIUM 8.6 04/09/2015 0900   PROT 5.2* 05/14/2015 0430   PROT 6.3* 04/09/2015 0900   ALBUMIN 2.1* 05/14/2015 0430   ALBUMIN 3.6 04/09/2015 0900   AST 20 05/14/2015 0430   AST 26 04/09/2015 0900   ALT 23  05/14/2015 0430   ALT 34 04/09/2015 0900   ALKPHOS 69 05/14/2015 0430   ALKPHOS 79 04/09/2015 0900   BILITOT 0.9 05/14/2015 0430   BILITOT 0.55 04/09/2015 0900   GFRNONAA >60 05/17/2015 0553   GFRAA >60 05/17/2015 0553   Lipase     Component Value Date/Time   LIPASE 21* 05/05/2015 2137       Studies/Results: Ct Abdomen Pelvis W Contrast  05/19/2015   CLINICAL DATA:  Recent percutaneous drainage of a multilocular abscess in the right lower quadrant of the abdomen. Followup.  EXAM: CT ABDOMEN AND PELVIS WITH CONTRAST  TECHNIQUE: Multidetector CT imaging of the abdomen and pelvis was performed using the standard protocol following bolus administration of intravenous contrast.  CONTRAST:  176mL OMNIPAQUE IOHEXOL 300 MG/ML IV. Oral contrast was not administered.  COMPARISON:  05/13/2015 and earlier.  FINDINGS: Interval essential resolution of the previously drained abscess in the right upper pelvis. The drained abscess is immediately adjacent to an abnormal loop of thick-walled small bowel which appears to be contained within an internal hernia, the opening of which is best seen on the sagittal reformatted images; on the axial images, the mesenteric vessels are tightly clustered at the entrance of the internal hernia. In addition, a right inguinal hernia is present, containing a decompressed loop of small bowel  and a small amount of ascites. Moderate ascites persists in the abdomen and pelvis, slightly increased in amount since the examination 6 days ago.  Normal-appearing liver, spleen, pancreas, adrenal glands and kidneys. High attenuation sludge within the gallbladder which is relatively contracted. No biliary ductal dilation. 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 No significant lymphadenopathy.  Small hiatal hernia. Stomach otherwise unremarkable. Mild wall thickening involving multiple loops of small bowel as noted previously without evidence of bowel obstruction. Entire colon  relatively decompressed, containing liquid stool. Residual contrast material within diverticula in the cecum, distal descending colon, and sigmoid colon.  Diffuse body wall edema, worse than on the examination 6 days ago. Edema involving the visualized scrotum, unchanged. Moderate-sized left pleural effusion and small right pleural effusion, unchanged. Associated passive atelectasis in the lower lobes, unchanged.  IMPRESSION: 1. Essential resolution of the previously drained abscess in the right upper pelvis. 2. The drained abscess is immediately adjacent to an abnormal loop of thick-walled small bowel which I believe is contained within an internal hernia. 3. Right inguinal hernia containing a decompressed loop of small bowel and a small amount of ascites. 4. Moderate abdominal and pelvic ascites, increased since the CT 6 days ago. 5. Severe anasarca, worse than on the examination 6 days ago. 6. Gallbladder sludge, unchanged. 7. Small hiatal hernia. 8. Stable moderate-sized left pleural effusion and small right pleural effusion with associated passive atelectasis in the lower lobes.   Electronically Signed   By: Evangeline Dakin M.D.   On: 05/19/2015 19:35    Anti-infectives: Anti-infectives    Start     Dose/Rate Route Frequency Ordered Stop   05/11/15 1400  cefTRIAXone (ROCEPHIN) 1 g in dextrose 5 % 50 mL IVPB - Premix     1 g 100 mL/hr over 30 Minutes Intravenous Every 24 hours 05/11/15 1211     05/10/15 1500  metroNIDAZOLE (FLAGYL) tablet 500 mg     500 mg Oral 3 times per day 05/10/15 1354 05/24/15 1759   05/07/15 1045  ciprofloxacin (CIPRO) IVPB 400 mg  Status:  Discontinued     400 mg 200 mL/hr over 60 Minutes Intravenous Every 12 hours 05/07/15 1032 05/07/15 1032   05/07/15 1045  metroNIDAZOLE (FLAGYL) IVPB 500 mg  Status:  Discontinued     500 mg 100 mL/hr over 60 Minutes Intravenous Every 8 hours 05/07/15 1032 05/07/15 1032   05/06/15 1400  piperacillin-tazobactam (ZOSYN) IVPB 3.375 g   Status:  Discontinued     3.375 g 12.5 mL/hr over 240 Minutes Intravenous 3 times per day 05/06/15 0535 05/11/15 1211   05/06/15 0300  vancomycin (VANCOCIN) IVPB 1000 mg/200 mL premix     1,000 mg 200 mL/hr over 60 Minutes Intravenous  Once 05/06/15 0247 05/06/15 0419   05/06/15 0245  piperacillin-tazobactam (ZOSYN) IVPB 3.375 g     3.375 g 12.5 mL/hr over 240 Minutes Intravenous  Once 05/06/15 0247 05/06/15 0539       Assessment/Plan Abdominal pain, possible appendicitis vs pain from carcinomatosis -The patient's history and symptoms seem consistent with appendicitis; however, it is possible this is all related to his carcinomatosis as well.  -CT done on 05/05/15, CT 05/13/15 showed RLQ abscess which is multilocular, CT 05/19/15 shows resolution of abscess -S/P IR perc drain, drainage (59mL/24hr) -CT done yesterday shows resolution of abscess.  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 [name] did this patients surgery back in 06/04/2013 (Ex lap with SBR) -On Gleevec C.diff colitis -started on flagyl PO Day #11 DVT Proph -Can be on Chemical DVT proph, only currently on SCD's (per primary service)  Follow up with Dr. [name] at discharge in 2-3 weeks, patient will need to call and verify time/date    LOS: 14 days    Nat Christen 05/20/2015, 9:45 AM Pager: [phone]

## 2015-05-20 NOTE — Discharge Instructions (Signed)
Per Radiology: Flush drain with 5cc's sterile NS daily Record output every 8 hours and take form to injection appointment at [facility] dressing every 2-3 days [facility] radiology will call you to set up the Outpatient drain injection appointment

## 2015-05-21 DIAGNOSIS — C269 Malignant neoplasm of ill-defined sites within the digestive system: Secondary | ICD-10-CM | POA: Diagnosis not present

## 2015-05-21 DIAGNOSIS — D649 Anemia, unspecified: Secondary | ICD-10-CM | POA: Diagnosis not present

## 2015-05-21 DIAGNOSIS — I2510 Atherosclerotic heart disease of native coronary artery without angina pectoris: Secondary | ICD-10-CM | POA: Diagnosis not present

## 2015-05-21 DIAGNOSIS — E785 Hyperlipidemia, unspecified: Secondary | ICD-10-CM | POA: Diagnosis not present

## 2015-05-21 DIAGNOSIS — I10 Essential (primary) hypertension: Secondary | ICD-10-CM | POA: Diagnosis not present

## 2015-05-21 DIAGNOSIS — I5032 Chronic diastolic (congestive) heart failure: Secondary | ICD-10-CM | POA: Diagnosis not present

## 2015-05-23 DIAGNOSIS — C269 Malignant neoplasm of ill-defined sites within the digestive system: Secondary | ICD-10-CM | POA: Diagnosis not present

## 2015-05-23 DIAGNOSIS — I10 Essential (primary) hypertension: Secondary | ICD-10-CM | POA: Diagnosis not present

## 2015-05-23 DIAGNOSIS — I5032 Chronic diastolic (congestive) heart failure: Secondary | ICD-10-CM | POA: Diagnosis not present

## 2015-05-23 DIAGNOSIS — I2510 Atherosclerotic heart disease of native coronary artery without angina pectoris: Secondary | ICD-10-CM | POA: Diagnosis not present

## 2015-05-23 DIAGNOSIS — E785 Hyperlipidemia, unspecified: Secondary | ICD-10-CM | POA: Diagnosis not present

## 2015-05-23 DIAGNOSIS — D649 Anemia, unspecified: Secondary | ICD-10-CM | POA: Diagnosis not present

## 2015-05-25 ENCOUNTER — Other Ambulatory Visit: Payer: Self-pay

## 2015-05-25 ENCOUNTER — Other Ambulatory Visit (HOSPITAL_BASED_OUTPATIENT_CLINIC_OR_DEPARTMENT_OTHER): Payer: Medicare Other

## 2015-05-25 ENCOUNTER — Ambulatory Visit (HOSPITAL_BASED_OUTPATIENT_CLINIC_OR_DEPARTMENT_OTHER): Payer: Medicare Other | Admitting: Nurse Practitioner

## 2015-05-25 ENCOUNTER — Telehealth: Payer: Self-pay | Admitting: Oncology

## 2015-05-25 VITALS — BP 163/62 | HR 55 | Temp 98.6°F | Resp 18 | Ht 67.0 in | Wt 173.2 lb

## 2015-05-25 DIAGNOSIS — C49A3 Gastrointestinal stromal tumor of small intestine: Secondary | ICD-10-CM

## 2015-05-25 DIAGNOSIS — C786 Secondary malignant neoplasm of retroperitoneum and peritoneum: Secondary | ICD-10-CM

## 2015-05-25 DIAGNOSIS — C494 Malignant neoplasm of connective and soft tissue of abdomen: Secondary | ICD-10-CM

## 2015-05-25 DIAGNOSIS — C762 Malignant neoplasm of abdomen: Secondary | ICD-10-CM

## 2015-05-25 LAB — CBC WITH DIFFERENTIAL/PLATELET
BASO%: 0.3 % (ref 0.0–2.0)
Basophils Absolute: 0 10*3/uL (ref 0.0–0.1)
EOS ABS: 0 10*3/uL (ref 0.0–0.5)
EOS%: 0.3 % (ref 0.0–7.0)
HCT: 25.7 % — ABNORMAL LOW (ref 38.4–49.9)
HEMOGLOBIN: 8.8 g/dL — AB (ref 13.0–17.1)
LYMPH#: 0.8 10*3/uL — AB (ref 0.9–3.3)
LYMPH%: 7.9 % — ABNORMAL LOW (ref 14.0–49.0)
MCH: 35.4 pg — ABNORMAL HIGH (ref 27.2–33.4)
MCHC: 34.2 g/dL (ref 32.0–36.0)
MCV: 103.4 fL — ABNORMAL HIGH (ref 79.3–98.0)
MONO#: 0.6 10*3/uL (ref 0.1–0.9)
MONO%: 5.9 % (ref 0.0–14.0)
NEUT%: 85.6 % — AB (ref 39.0–75.0)
NEUTROS ABS: 8.9 10*3/uL — AB (ref 1.5–6.5)
PLATELETS: 344 10*3/uL (ref 140–400)
RBC: 2.49 10*6/uL — AB (ref 4.20–5.82)
RDW: 13.7 % (ref 11.0–14.6)
WBC: 10.4 10*3/uL — AB (ref 4.0–10.3)

## 2015-05-25 LAB — COMPREHENSIVE METABOLIC PANEL (CC13)
ALK PHOS: 56 U/L (ref 40–150)
ALT: 12 U/L (ref 0–55)
AST: 20 U/L (ref 5–34)
Albumin: 2.4 g/dL — ABNORMAL LOW (ref 3.5–5.0)
Anion Gap: 9 mEq/L (ref 3–11)
BUN: 14.2 mg/dL (ref 7.0–26.0)
CO2: 27 mEq/L (ref 22–29)
Calcium: 8 mg/dL — ABNORMAL LOW (ref 8.4–10.4)
Chloride: 105 mEq/L (ref 98–109)
Creatinine: 1.2 mg/dL (ref 0.7–1.3)
EGFR: 57 mL/min/{1.73_m2} — ABNORMAL LOW (ref >90)
Glucose: 142 mg/dl — ABNORMAL HIGH (ref 70–140)
POTASSIUM: 3.8 meq/L (ref 3.5–5.1)
Sodium: 140 mEq/L (ref 136–145)
TOTAL PROTEIN: 5.8 g/dL — AB (ref 6.4–8.3)
Total Bilirubin: 0.28 mg/dL (ref 0.20–1.20)

## 2015-05-25 NOTE — Telephone Encounter (Signed)
Gave patient avs report and appointments for July. Per patient he already had EKG today.

## 2015-05-25 NOTE — Progress Notes (Addendum)
  [place] OFFICE PROGRESS NOTE   Diagnosis:  Gastrointestinal stromal tumor   INTERVAL HISTORY:   [name] [name] returns for follow-up. He continues [place]. He was hospitalized 05/05/2015 through 05/20/2015 presenting with abdominal pain. He was found to have a right lower abdomen abscess and underwent catheter drainage by interventional radiology 05/13/2015. Follow-up CT 05/19/2015 showed resolution of the abscess.  [name] [name] reports he has had no abdominal pain for several days. No fever. Diarrhea has resolved. He denies nausea/vomiting. Appetite and energy are slowly improving. He continues to note leg swelling. The scrotal swelling has improved. He denies shortness of breath. No chest pain.  Objective:  Vital signs in last 24 hours:  Blood pressure 163/62, pulse 55, temperature 98.6 F (37 C), temperature source Oral, resp. rate 18, height $RemoveBe'5\' 7"'bgUWpnVYn$  (1.702 m), weight 173 lb 3.2 oz (78.563 kg), SpO2 98 %.    HEENT: No thrush or ulcers. Resp: Lungs clear bilaterally. Cardio: Irregular. GI: No organomegaly. Nontender. No mass. Drainage catheter right lower abdomen. Vascular: Edema throughout both legs.  Lab Results:  Lab Results  Component Value Date   WBC 10.4* 05/25/2015   HGB 8.8* 05/25/2015   HCT 25.7* 05/25/2015   MCV 103.4* 05/25/2015   PLT 344 05/25/2015   NEUTROABS 8.9* 05/25/2015   05/25/2015 EKG with PACs.  Imaging:  No results found.  Medications: I have reviewed the patient's current medications.  Assessment/Plan: 1. 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 History of anemia, status post a nondiagnostic bone marrow biopsy 10/21/2014  3. NSTEMI March 2014  4. Admission 05/06/2015 with acute onset right abdomen pain-potentially related to acute appendicitis versus pain from carcinomatosis  CT 05/13/2015 consistent with a right lower abdomen abscess, status post catheter drainage by interventional radiology 05/13/2015  Follow-up CT 05/19/2015 showed resolution of the abscess.  5. C. difficile colitis 05/10/2015    Disposition: [name] [name] continues [place] for the metastatic GIST. He is continuing to recover from the recent hospitalization with an abdominal abscess. The drainage catheter remains in place. We contacted interventional radiology. They will arrange for follow-up in their clinic. He is scheduled to see Dr. [name] later this month.  He was noted to have an irregular rhythm on exam today. EKG showed PACs.  We will schedule a follow-up visit in 4 weeks. He will contact the office in the interim with any problems.  Patient seen with Dr. [name]. 25 minutes were spent face-to-face at today's visit with the majority of that time involved in counseling/coordination of care.    Ned Card ANP/GNP-BC   05/25/2015  2:27 PM  This was a shared visit with Ned Card. [name] [name] was interviewed and examined.He is recovered from the mission with appendicitis versus perforated tumor. We will refer him to interventional radiology to deciding on removal of the abdominal drain.  He will continue Gleevec for treatment of the gastrointestinal stromal tumor.  Julieanne Manson, M.D.

## 2015-05-26 DIAGNOSIS — E785 Hyperlipidemia, unspecified: Secondary | ICD-10-CM | POA: Diagnosis not present

## 2015-05-26 DIAGNOSIS — I2510 Atherosclerotic heart disease of native coronary artery without angina pectoris: Secondary | ICD-10-CM | POA: Diagnosis not present

## 2015-05-26 DIAGNOSIS — C269 Malignant neoplasm of ill-defined sites within the digestive system: Secondary | ICD-10-CM | POA: Diagnosis not present

## 2015-05-26 DIAGNOSIS — D649 Anemia, unspecified: Secondary | ICD-10-CM | POA: Diagnosis not present

## 2015-05-26 DIAGNOSIS — I10 Essential (primary) hypertension: Secondary | ICD-10-CM | POA: Diagnosis not present

## 2015-05-26 DIAGNOSIS — I5032 Chronic diastolic (congestive) heart failure: Secondary | ICD-10-CM | POA: Diagnosis not present

## 2015-05-28 ENCOUNTER — Other Ambulatory Visit (HOSPITAL_COMMUNITY): Payer: Self-pay | Admitting: Radiology

## 2015-05-28 DIAGNOSIS — I10 Essential (primary) hypertension: Secondary | ICD-10-CM | POA: Diagnosis not present

## 2015-05-28 DIAGNOSIS — C269 Malignant neoplasm of ill-defined sites within the digestive system: Secondary | ICD-10-CM | POA: Diagnosis not present

## 2015-05-28 DIAGNOSIS — I2510 Atherosclerotic heart disease of native coronary artery without angina pectoris: Secondary | ICD-10-CM | POA: Diagnosis not present

## 2015-05-28 DIAGNOSIS — I5032 Chronic diastolic (congestive) heart failure: Secondary | ICD-10-CM | POA: Diagnosis not present

## 2015-05-28 DIAGNOSIS — E785 Hyperlipidemia, unspecified: Secondary | ICD-10-CM | POA: Diagnosis not present

## 2015-05-28 DIAGNOSIS — D649 Anemia, unspecified: Secondary | ICD-10-CM | POA: Diagnosis not present

## 2015-06-01 ENCOUNTER — Ambulatory Visit (HOSPITAL_COMMUNITY)
Admission: RE | Admit: 2015-06-01 | Discharge: 2015-06-01 | Disposition: A | Discharge status: Home / Self Care | Payer: Medicare Other | Source: Ambulatory Visit | Attending: Radiology | Admitting: Radiology

## 2015-06-01 DIAGNOSIS — C494 Malignant neoplasm of connective and soft tissue of abdomen: Secondary | ICD-10-CM | POA: Insufficient documentation

## 2015-06-01 DIAGNOSIS — Z9889 Other specified postprocedural states: Secondary | ICD-10-CM | POA: Diagnosis not present

## 2015-06-01 DIAGNOSIS — K651 Peritoneal abscess: Secondary | ICD-10-CM | POA: Diagnosis not present

## 2015-06-01 DIAGNOSIS — C786 Secondary malignant neoplasm of retroperitoneum and peritoneum: Secondary | ICD-10-CM | POA: Diagnosis not present

## 2015-06-01 DIAGNOSIS — C269 Malignant neoplasm of ill-defined sites within the digestive system: Secondary | ICD-10-CM | POA: Diagnosis not present

## 2015-06-01 MED ORDER — IOHEXOL 300 MG/ML  SOLN
10.0000 mL | Freq: Once | INTRAMUSCULAR | Status: AC | PRN
  Administered 2015-06-01: 10 mL via INTRAVENOUS

## 2015-06-02 DIAGNOSIS — R630 Anorexia: Secondary | ICD-10-CM | POA: Diagnosis not present

## 2015-06-02 DIAGNOSIS — G4700 Insomnia, unspecified: Secondary | ICD-10-CM | POA: Diagnosis not present

## 2015-06-02 DIAGNOSIS — A047 Enterocolitis due to Clostridium difficile: Secondary | ICD-10-CM | POA: Diagnosis not present

## 2015-06-02 DIAGNOSIS — D518 Other vitamin B12 deficiency anemias: Secondary | ICD-10-CM | POA: Diagnosis not present

## 2015-06-02 DIAGNOSIS — K352 Acute appendicitis with generalized peritonitis: Secondary | ICD-10-CM | POA: Diagnosis not present

## 2015-06-02 DIAGNOSIS — D509 Iron deficiency anemia, unspecified: Secondary | ICD-10-CM | POA: Diagnosis not present

## 2015-06-02 DIAGNOSIS — I499 Cardiac arrhythmia, unspecified: Secondary | ICD-10-CM | POA: Diagnosis not present

## 2015-06-03 DIAGNOSIS — I5032 Chronic diastolic (congestive) heart failure: Secondary | ICD-10-CM | POA: Diagnosis not present

## 2015-06-03 DIAGNOSIS — E785 Hyperlipidemia, unspecified: Secondary | ICD-10-CM | POA: Diagnosis not present

## 2015-06-03 DIAGNOSIS — D649 Anemia, unspecified: Secondary | ICD-10-CM | POA: Diagnosis not present

## 2015-06-03 DIAGNOSIS — I10 Essential (primary) hypertension: Secondary | ICD-10-CM | POA: Diagnosis not present

## 2015-06-03 DIAGNOSIS — C269 Malignant neoplasm of ill-defined sites within the digestive system: Secondary | ICD-10-CM | POA: Diagnosis not present

## 2015-06-03 DIAGNOSIS — I2510 Atherosclerotic heart disease of native coronary artery without angina pectoris: Secondary | ICD-10-CM | POA: Diagnosis not present

## 2015-06-04 ENCOUNTER — Other Ambulatory Visit: Payer: Self-pay | Admitting: General Surgery

## 2015-06-04 DIAGNOSIS — C49A3 Gastrointestinal stromal tumor of small intestine: Secondary | ICD-10-CM

## 2015-06-04 NOTE — Addendum Note (Signed)
Addended by: Excell Seltzer T on: 06/04/2015 12:29 PM   Modules accepted: Orders

## 2015-06-08 ENCOUNTER — Ambulatory Visit
Admission: RE | Admit: 2015-06-08 | Discharge: 2015-06-08 | Disposition: A | Discharge status: Home / Self Care | Payer: Medicare Other | Source: Ambulatory Visit | Attending: General Surgery | Admitting: General Surgery

## 2015-06-08 DIAGNOSIS — C179 Malignant neoplasm of small intestine, unspecified: Secondary | ICD-10-CM | POA: Diagnosis not present

## 2015-06-08 DIAGNOSIS — C786 Secondary malignant neoplasm of retroperitoneum and peritoneum: Secondary | ICD-10-CM | POA: Diagnosis not present

## 2015-06-08 DIAGNOSIS — K8020 Calculus of gallbladder without cholecystitis without obstruction: Secondary | ICD-10-CM | POA: Diagnosis not present

## 2015-06-08 MED ORDER — IOPAMIDOL (ISOVUE-300) INJECTION 61%
100.0000 mL | Freq: Once | INTRAVENOUS | Status: AC | PRN
  Administered 2015-06-08: 100 mL via INTRAVENOUS

## 2015-06-10 ENCOUNTER — Other Ambulatory Visit: Payer: Self-pay

## 2015-06-10 DIAGNOSIS — R188 Other ascites: Secondary | ICD-10-CM | POA: Diagnosis not present

## 2015-06-10 DIAGNOSIS — K432 Incisional hernia without obstruction or gangrene: Secondary | ICD-10-CM | POA: Diagnosis not present

## 2015-06-10 DIAGNOSIS — Z8619 Personal history of other infectious and parasitic diseases: Secondary | ICD-10-CM | POA: Diagnosis not present

## 2015-06-10 DIAGNOSIS — K3533 Acute appendicitis with perforation and localized peritonitis, with abscess: Secondary | ICD-10-CM

## 2015-06-10 DIAGNOSIS — I2510 Atherosclerotic heart disease of native coronary artery without angina pectoris: Secondary | ICD-10-CM | POA: Diagnosis not present

## 2015-06-10 DIAGNOSIS — K651 Peritoneal abscess: Secondary | ICD-10-CM | POA: Diagnosis not present

## 2015-06-10 DIAGNOSIS — Z8509 Personal history of malignant neoplasm of other digestive organs: Secondary | ICD-10-CM | POA: Diagnosis not present

## 2015-06-10 DIAGNOSIS — L0291 Cutaneous abscess, unspecified: Secondary | ICD-10-CM

## 2015-06-10 DIAGNOSIS — K4090 Unilateral inguinal hernia, without obstruction or gangrene, not specified as recurrent: Secondary | ICD-10-CM | POA: Diagnosis not present

## 2015-06-12 ENCOUNTER — Other Ambulatory Visit (HOSPITAL_COMMUNITY): Payer: Self-pay | Admitting: Interventional Radiology

## 2015-06-12 ENCOUNTER — Ambulatory Visit (HOSPITAL_COMMUNITY)
Admission: RE | Admit: 2015-06-12 | Discharge: 2015-06-12 | Disposition: A | Discharge status: Home / Self Care | Payer: Medicare Other | Source: Ambulatory Visit | Attending: Interventional Radiology | Admitting: Interventional Radiology

## 2015-06-12 DIAGNOSIS — K383 Fistula of appendix: Secondary | ICD-10-CM | POA: Diagnosis not present

## 2015-06-12 DIAGNOSIS — C269 Malignant neoplasm of ill-defined sites within the digestive system: Secondary | ICD-10-CM | POA: Diagnosis not present

## 2015-06-12 DIAGNOSIS — E785 Hyperlipidemia, unspecified: Secondary | ICD-10-CM | POA: Diagnosis not present

## 2015-06-12 DIAGNOSIS — I2510 Atherosclerotic heart disease of native coronary artery without angina pectoris: Secondary | ICD-10-CM | POA: Diagnosis not present

## 2015-06-12 DIAGNOSIS — R180 Malignant ascites: Secondary | ICD-10-CM | POA: Diagnosis not present

## 2015-06-12 DIAGNOSIS — I5032 Chronic diastolic (congestive) heart failure: Secondary | ICD-10-CM | POA: Diagnosis not present

## 2015-06-12 DIAGNOSIS — C169 Malignant neoplasm of stomach, unspecified: Secondary | ICD-10-CM | POA: Insufficient documentation

## 2015-06-12 DIAGNOSIS — C49A2 Gastrointestinal stromal tumor of stomach: Secondary | ICD-10-CM

## 2015-06-12 DIAGNOSIS — I10 Essential (primary) hypertension: Secondary | ICD-10-CM | POA: Diagnosis not present

## 2015-06-12 DIAGNOSIS — Z9889 Other specified postprocedural states: Secondary | ICD-10-CM | POA: Insufficient documentation

## 2015-06-12 DIAGNOSIS — D649 Anemia, unspecified: Secondary | ICD-10-CM | POA: Diagnosis not present

## 2015-06-12 MED ORDER — LIDOCAINE HCL 1 % IJ SOLN
INTRAMUSCULAR | Status: AC
  Filled 2015-06-12: qty 20

## 2015-06-12 MED ORDER — IOHEXOL 300 MG/ML  SOLN
50.0000 mL | Freq: Once | INTRAMUSCULAR | Status: AC | PRN
  Administered 2015-06-12: 20 mL via INTRAVENOUS

## 2015-06-12 NOTE — Procedures (Signed)
Successful US guided paracentesis from LUQ.  Yielded 5 liters of serous fluid.  No immediate complications.  Pt tolerated well.   Specimen was not sent for labs.  Tsosie Billing D PA-C 06/12/2015 4:20 PM

## 2015-06-12 NOTE — Procedures (Signed)
Successful ultrasound-guided paracentesis yielding [5] liters of peritoneal fluid.  Fluoroscopic guided drainage catheter injection confirmed the presence of a residual tiny fistulous connection with the end of the decompressed abscess cavity within the right lower abdominal quadrant and the residual lumen of the appendix.   The patient will return to the [facility] Interventional Radiology drain clinic on Tuesday, 7/5 for repeat percutaneous drainage catheter injection and (if needed) ultrasound-guided paracentesis.   Of note, patient has been team deemed a non operative candidate by Dr. [name] ([facility]) and as such, once/if the fistula closes, the percutaneous drainage catheter may be removed.

## 2015-06-16 DIAGNOSIS — I5032 Chronic diastolic (congestive) heart failure: Secondary | ICD-10-CM | POA: Diagnosis not present

## 2015-06-16 DIAGNOSIS — C269 Malignant neoplasm of ill-defined sites within the digestive system: Secondary | ICD-10-CM | POA: Diagnosis not present

## 2015-06-16 DIAGNOSIS — I10 Essential (primary) hypertension: Secondary | ICD-10-CM | POA: Diagnosis not present

## 2015-06-16 DIAGNOSIS — D649 Anemia, unspecified: Secondary | ICD-10-CM | POA: Diagnosis not present

## 2015-06-16 DIAGNOSIS — I2510 Atherosclerotic heart disease of native coronary artery without angina pectoris: Secondary | ICD-10-CM | POA: Diagnosis not present

## 2015-06-16 DIAGNOSIS — E785 Hyperlipidemia, unspecified: Secondary | ICD-10-CM | POA: Diagnosis not present

## 2015-06-18 ENCOUNTER — Telehealth: Payer: Self-pay | Admitting: *Deleted

## 2015-06-18 ENCOUNTER — Telehealth: Payer: Self-pay | Admitting: Oncology

## 2015-06-18 ENCOUNTER — Telehealth: Payer: Self-pay | Admitting: Nurse Practitioner

## 2015-06-18 ENCOUNTER — Other Ambulatory Visit: Payer: Self-pay | Admitting: *Deleted

## 2015-06-18 ENCOUNTER — Ambulatory Visit (HOSPITAL_BASED_OUTPATIENT_CLINIC_OR_DEPARTMENT_OTHER): Payer: Medicare Other | Admitting: Oncology

## 2015-06-18 VITALS — BP 137/59 | HR 118 | Temp 98.4°F | Resp 20 | Ht 67.0 in | Wt 160.8 lb

## 2015-06-18 DIAGNOSIS — K651 Peritoneal abscess: Secondary | ICD-10-CM

## 2015-06-18 DIAGNOSIS — C786 Secondary malignant neoplasm of retroperitoneum and peritoneum: Secondary | ICD-10-CM

## 2015-06-18 DIAGNOSIS — C49A3 Gastrointestinal stromal tumor of small intestine: Secondary | ICD-10-CM

## 2015-06-18 DIAGNOSIS — C494 Malignant neoplasm of connective and soft tissue of abdomen: Secondary | ICD-10-CM | POA: Diagnosis not present

## 2015-06-18 DIAGNOSIS — IMO0002 Reserved for concepts with insufficient information to code with codable children: Secondary | ICD-10-CM

## 2015-06-18 DIAGNOSIS — C762 Malignant neoplasm of abdomen: Secondary | ICD-10-CM

## 2015-06-18 DIAGNOSIS — R188 Other ascites: Secondary | ICD-10-CM | POA: Diagnosis not present

## 2015-06-18 NOTE — Telephone Encounter (Signed)
PT.'S SON STATES ON 06/12/15 INTERVENTIONAL RADIOLOGY AT [facility] DRAINED FOUR TO FIVE LITERS FROM PT.'S ABDOMEN PER RADIOLOGIST, DR.WATTS. ON 06/14/15 PT. BEGAN NOT FEELING WELL. HE IS STILL RUNNING A LOW GRADE FEVER OF 99.2 TO 99.4. PT. IS EATING SOME BUT HIS FLUID INTAKE IS AROUND 32 OUNCES IN THE PAST 24 HOURS. HE HAS BEEN HAVING NORMAL BOWEL MOVEMENTS BUT YESTERDAY HAD DIARRHEA X2. PT. IS VERY WEAK AND HIS ABDOMEN IS GETTING DISTENDED. WOULD LIKE HIS FATHER SEEN EARLIER THAN THE 06/25/15 APPOINTMENT WITH DR.SHERRILL. VERBAL ORDER AND READ BACK TO DR.Middleton. CAN SEE CYNDEE BACON,NP TODAY OR TOMORROW. NOTIFIED PT.'S SON OF DR.SHERRILL'S INSTRUCTION. PT. WILL SEE CYNDEE BACON TOMORROW WITH LAB AT 8:30AM AND APPOINTMENT WITH CYNDEE AT 9:00AM. POF TO SCHEDLULER.

## 2015-06-18 NOTE — Telephone Encounter (Signed)
Pt scheduled for labs/flush/ov with NP/CB per 06/30 POF.... [name] [name] pt is aware

## 2015-06-18 NOTE — Progress Notes (Addendum)
[place] OFFICE PROGRESS NOTE   Diagnosis:  Gastrointestinal stromal tumor  INTERVAL HISTORY:   [name] [name] returns prior to scheduled follow-up. He developed increased abdominal distention and leakage around the abdominal drainage catheter last week. He was seen in interventional radiology on 06/12/2015. The drainage catheter was injected showing residual tiny fistulous connection with the end of the decompressed abscess cavity within the right lower abdominal quadrant and the residual lumen of the appendix.  He also underwent a paracentesis with 5 L of fluid removed. The abdominal distention has recurred. He reports "low-grade fevers" over the past 2-3 weeks in the 99 range. No shaking chills. He is losing weight. He notes a marked decrease in appetite following breakfast. No nausea or vomiting. He had a normal bowel movement today. Leg swelling is better. No rash.  Objective:  Vital signs in last 24 hours:  Blood pressure 137/59, pulse 118, temperature 98.4 F (36.9 C), temperature source Oral, resp. rate 20, height 5' 7"  (1.702 m), weight 160 lb 12.8 oz (72.938 kg), SpO2 100 %.Repeat heart rate 108    HEENT: No thrush or ulcers. Resp: Breath sounds diminished at the bases. Cardio: Irregular. GI: Abdomen distended consistent with ascites. No organomegaly. No mass. Drainage catheter right lower abdomen. Minimal erythema at the catheter site. Collection bag with light brown drainage. Vascular: Trace pitting edema at the lower legs bilaterally. Sacral edema. Skin: Normal skin turgor.    Lab Results:  Lab Results  Component Value Date   WBC 10.4* 05/25/2015   HGB 8.8* 05/25/2015   HCT 25.7* 05/25/2015   MCV 103.4* 05/25/2015   PLT 344 05/25/2015   NEUTROABS 8.9* 05/25/2015    Imaging:  No results found.  Medications: I have reviewed the patient's current medications.  Assessment/Plan: 1. Gastrointestinal stromal tumor of small bowel, status post primary  resection 06/04/2013  CT 10/16/2014 consistent with extensive carcinomatosis, CT-guided biopsy of an omental mass 10/21/2014 confirmed a gastrointestinal stromal tumor  Initiation of Gleevec 10/31/2014  CT 01/13/2015 revealed improvement in the peritoneal and omental metastatic disease  CT 05/05/2015 with no progression of omental/peritoneal metastatic disease, increased ascites, and appendix inflammation  2. History of anemia, status post a nondiagnostic bone marrow biopsy 10/21/2014  3. NSTEMI March 2014  4. Admission 05/06/2015 with acute onset right abdomen pain-potentially related to acute appendicitis versus pain from carcinomatosis  CT 05/13/2015 consistent with a right lower abdomen abscess, status post catheter drainage by interventional radiology 05/13/2015  Follow-up CT 05/19/2015 showed resolution of the abscess.  Follow-up evaluation in interventional radiology 06/01/2015 showed the abscess cavity had resolved. The abscess drainage catheter communicated with the cecum via the appendix. The catheter was left in place.  CT abdomen/pelvis 06/08/2015 showed the right pelvic drain in place without recurrent or residual surrounding fluid collection. Peritoneal metastasis with slight increase in small to moderate volume of abdominal pelvic ascites.  Drainage catheter injection 06/12/2015 showed residual tiny fistulous connection with the end of the decompressed abscess cavity within the right lower abdominal quadrant and the residual lumen of the appendix.  Paracentesis 06/12/2015 with 5 liters of fluid removed  5. C. difficile colitis 05/10/2015   Disposition: [name] [name] has ascites of unclear etiology. Dr. [name] reviewed with [name] [name] and his sons that the ascites could be due to cancer or infection. We are referring him for a paracentesis 06/19/2015 with fluid to be sent for cell count, gram stain and culture.   He is scheduled to follow-up in radiology regarding  the  drainage catheter on 06/23/2015. His next appointment here is 06/25/2015. They will contact the office prior to that visit with further problems.  Patient seen with Dr. [name]. 25 minutes were spent face-to-face at today's visit with the majority of that time involved in counseling/coordination of care.    Ned Card ANP/GNP-BC   06/18/2015  4:35 PM   This was a shared visit with Ned Card. [name] [name] was interviewd and examined.  I discussed the case with Dr. [name].  He does not recommend surgery.  Dr. [name] and IR will manage the abscess drain. I am concerned the ascites is related to progression of the GIST.  He is not a candidate for salvage therapy if his performance status does not improve.  Julieanne Manson, MD

## 2015-06-18 NOTE — Telephone Encounter (Signed)
Pt coming in today for MD visit per 06/30 POF, pt's son is aware... [name] [name]

## 2015-06-19 ENCOUNTER — Ambulatory Visit (HOSPITAL_COMMUNITY): Payer: Medicare Other

## 2015-06-19 ENCOUNTER — Other Ambulatory Visit: Payer: Self-pay | Admitting: *Deleted

## 2015-06-19 ENCOUNTER — Ambulatory Visit: Payer: Medicare Other

## 2015-06-19 ENCOUNTER — Ambulatory Visit (HOSPITAL_COMMUNITY)
Admission: RE | Admit: 2015-06-19 | Discharge: 2015-06-19 | Disposition: A | Discharge status: Home / Self Care | Payer: Medicare Other | Source: Ambulatory Visit | Attending: Nurse Practitioner | Admitting: Nurse Practitioner

## 2015-06-19 ENCOUNTER — Other Ambulatory Visit: Payer: Medicare Other

## 2015-06-19 ENCOUNTER — Encounter: Payer: Medicare Other | Admitting: Nurse Practitioner

## 2015-06-19 DIAGNOSIS — C762 Malignant neoplasm of abdomen: Secondary | ICD-10-CM

## 2015-06-19 DIAGNOSIS — C49A3 Gastrointestinal stromal tumor of small intestine: Secondary | ICD-10-CM

## 2015-06-19 DIAGNOSIS — R188 Other ascites: Secondary | ICD-10-CM | POA: Diagnosis not present

## 2015-06-19 DIAGNOSIS — IMO0002 Reserved for concepts with insufficient information to code with codable children: Secondary | ICD-10-CM

## 2015-06-19 LAB — BODY FLUID CELL COUNT WITH DIFFERENTIAL
Lymphs, Fluid: 13 %
Monocyte-Macrophage-Serous Fluid: 32 % — ABNORMAL LOW (ref 50–90)
Neutrophil Count, Fluid: 55 % — ABNORMAL HIGH (ref 0–25)
WBC FLUID: 231 uL (ref 0–1000)

## 2015-06-19 LAB — PATHOLOGIST SMEAR REVIEW

## 2015-06-19 NOTE — Procedures (Signed)
US guided diagnostic/therapeutic paracentesis performed yielding 4.9 liters clear, yellow fluid. A portion of the fluid was sent to the lab for preordered studies. No immediate complications.

## 2015-06-20 LAB — GRAM STAIN

## 2015-06-23 ENCOUNTER — Ambulatory Visit (HOSPITAL_COMMUNITY)
Admission: RE | Admit: 2015-06-23 | Discharge: 2015-06-23 | Disposition: A | Discharge status: Home / Self Care | Payer: Medicare Other | Source: Ambulatory Visit | Attending: General Surgery | Admitting: General Surgery

## 2015-06-23 DIAGNOSIS — Z4803 Encounter for change or removal of drains: Secondary | ICD-10-CM | POA: Insufficient documentation

## 2015-06-23 DIAGNOSIS — Z85831 Personal history of malignant neoplasm of soft tissue: Secondary | ICD-10-CM | POA: Diagnosis not present

## 2015-06-23 DIAGNOSIS — K632 Fistula of intestine: Secondary | ICD-10-CM | POA: Diagnosis not present

## 2015-06-23 MED ORDER — IOHEXOL 300 MG/ML  SOLN
50.0000 mL | Freq: Once | INTRAMUSCULAR | Status: AC | PRN
  Administered 2015-06-23: 10 mL via INTRAVENOUS

## 2015-06-23 NOTE — Procedures (Signed)
Contrast injection demonstrates a resolved periappendiceal abscess and fistulous connection to the tip of the appendix.  As such, the percutaneous drainage catheter was removed

## 2015-06-24 LAB — CULTURE, BODY FLUID W GRAM STAIN -BOTTLE: Culture: NO GROWTH

## 2015-06-24 LAB — CULTURE, BODY FLUID-BOTTLE

## 2015-06-25 ENCOUNTER — Other Ambulatory Visit (HOSPITAL_BASED_OUTPATIENT_CLINIC_OR_DEPARTMENT_OTHER): Payer: Medicare Other

## 2015-06-25 ENCOUNTER — Telehealth: Payer: Self-pay | Admitting: Oncology

## 2015-06-25 ENCOUNTER — Ambulatory Visit (HOSPITAL_BASED_OUTPATIENT_CLINIC_OR_DEPARTMENT_OTHER): Payer: Medicare Other | Admitting: Oncology

## 2015-06-25 VITALS — BP 130/55 | HR 107 | Temp 98.3°F | Resp 17 | Ht 67.0 in | Wt 154.5 lb

## 2015-06-25 DIAGNOSIS — C494 Malignant neoplasm of connective and soft tissue of abdomen: Secondary | ICD-10-CM

## 2015-06-25 DIAGNOSIS — C762 Malignant neoplasm of abdomen: Secondary | ICD-10-CM

## 2015-06-25 DIAGNOSIS — C49A3 Gastrointestinal stromal tumor of small intestine: Secondary | ICD-10-CM

## 2015-06-25 DIAGNOSIS — D649 Anemia, unspecified: Secondary | ICD-10-CM | POA: Diagnosis not present

## 2015-06-25 DIAGNOSIS — C786 Secondary malignant neoplasm of retroperitoneum and peritoneum: Secondary | ICD-10-CM | POA: Diagnosis not present

## 2015-06-25 DIAGNOSIS — E785 Hyperlipidemia, unspecified: Secondary | ICD-10-CM | POA: Diagnosis not present

## 2015-06-25 DIAGNOSIS — C269 Malignant neoplasm of ill-defined sites within the digestive system: Secondary | ICD-10-CM | POA: Diagnosis not present

## 2015-06-25 DIAGNOSIS — I5032 Chronic diastolic (congestive) heart failure: Secondary | ICD-10-CM | POA: Diagnosis not present

## 2015-06-25 DIAGNOSIS — R601 Generalized edema: Secondary | ICD-10-CM | POA: Diagnosis not present

## 2015-06-25 DIAGNOSIS — I2510 Atherosclerotic heart disease of native coronary artery without angina pectoris: Secondary | ICD-10-CM | POA: Diagnosis not present

## 2015-06-25 DIAGNOSIS — I10 Essential (primary) hypertension: Secondary | ICD-10-CM | POA: Diagnosis not present

## 2015-06-25 LAB — CBC WITH DIFFERENTIAL/PLATELET
BASO%: 0.4 % (ref 0.0–2.0)
BASOS ABS: 0 10*3/uL (ref 0.0–0.1)
EOS ABS: 0.1 10*3/uL (ref 0.0–0.5)
EOS%: 1 % (ref 0.0–7.0)
HCT: 25.4 % — ABNORMAL LOW (ref 38.4–49.9)
HEMOGLOBIN: 8.5 g/dL — AB (ref 13.0–17.1)
LYMPH%: 7.7 % — ABNORMAL LOW (ref 14.0–49.0)
MCH: 34 pg — ABNORMAL HIGH (ref 27.2–33.4)
MCHC: 33.6 g/dL (ref 32.0–36.0)
MCV: 101.2 fL — AB (ref 79.3–98.0)
MONO#: 0.6 10*3/uL (ref 0.1–0.9)
MONO%: 7 % (ref 0.0–14.0)
NEUT#: 7.3 10*3/uL — ABNORMAL HIGH (ref 1.5–6.5)
NEUT%: 83.9 % — AB (ref 39.0–75.0)
PLATELETS: 368 10*3/uL (ref 140–400)
RBC: 2.51 10*6/uL — AB (ref 4.20–5.82)
RDW: 13.7 % (ref 11.0–14.6)
WBC: 8.7 10*3/uL (ref 4.0–10.3)
lymph#: 0.7 10*3/uL — ABNORMAL LOW (ref 0.9–3.3)

## 2015-06-25 LAB — COMPREHENSIVE METABOLIC PANEL (CC13)
ALBUMIN: 1.7 g/dL — AB (ref 3.5–5.0)
ALT: 32 U/L (ref 0–55)
AST: 28 U/L (ref 5–34)
Alkaline Phosphatase: 95 U/L (ref 40–150)
Anion Gap: 7 mEq/L (ref 3–11)
BILIRUBIN TOTAL: 0.28 mg/dL (ref 0.20–1.20)
BUN: 27.7 mg/dL — AB (ref 7.0–26.0)
CALCIUM: 7.9 mg/dL — AB (ref 8.4–10.4)
CHLORIDE: 104 meq/L (ref 98–109)
CO2: 28 meq/L (ref 22–29)
Creatinine: 1.4 mg/dL — ABNORMAL HIGH (ref 0.7–1.3)
EGFR: 45 mL/min/{1.73_m2} — ABNORMAL LOW (ref >90)
Glucose: 148 mg/dl — ABNORMAL HIGH (ref 70–140)
Potassium: 3.8 mEq/L (ref 3.5–5.1)
Sodium: 139 mEq/L (ref 136–145)
Total Protein: 5.5 g/dL — ABNORMAL LOW (ref 6.4–8.3)

## 2015-06-25 MED ORDER — SACCHAROMYCES BOULARDII 250 MG PO CAPS: 250.0000 mg | ORAL_CAPSULE | Freq: Two times a day (BID) | ORAL | Status: DC

## 2015-06-25 NOTE — Telephone Encounter (Signed)
Gave and printed appt sched and avs for pt for July  °

## 2015-06-25 NOTE — Progress Notes (Signed)
[place] OFFICE PROGRESS NOTE   Diagnosis: Gastrointestinal stromal tumor  INTERVAL HISTORY:   [name] returns as scheduled. He continues [place]. No fever. He reports feeling better since the abdominal drain was removed earlier this week.[name] He underwent a paracentesis on 06/19/2015 for 4.9 L of fluid. The fluid cytology was negative for malignant cells. A culture returned negative.  Mr. Chrisley continues to have a poor appetite.  Objective:  Vital signs in last 24 hours:  Blood pressure 130/55, pulse 107, temperature 98.3 F (36.8 C), temperature source Oral, resp. rate 17, height 5' 7"  (1.702 m), weight 154 lb 8 oz (70.081 kg), SpO2 99 %.    HEENT: No thrush or ulcers Resp: Lungs clear bilaterally Cardio: Irregular GI: No hepatomegaly, no mass, nontender, abdomen is moderately distended. Vascular: Trace Pitting edema at the lower leg bilaterally, pitting edema at the sacrum and abdominal wall     Lab Results:  Lab Results  Component Value Date   WBC 8.7 06/25/2015   HGB 8.5* 06/25/2015   HCT 25.4* 06/25/2015   MCV 101.2* 06/25/2015   PLT 368 06/25/2015   NEUTROABS 7.3* 06/25/2015   Potassium 3.8, creatinine 1.4, albumen 1.7, bilirubin 0.28 Imaging:  Ir Sinus/fist Tube Chk-non Gi  06/23/2015   CLINICAL DATA:  History of malignant GIST with peritoneal carcinomatosis, incidentally noted to have a ruptured appendicitis CT scan performed 05/13/2015 for which the patient underwent a technically successful CT-guided percutaneous drainage catheter (also on 05/13/2015).  Subsequent CT scan demonstrated complete resolution of the periappendiceal abscess, however contrast injections performed both 06/01/2015 and 06/12/2015 demonstrated a residual fistulous connections.  Patient returns to the Interventional Radiology drain Clinic for repeat fluoroscopic guided percutaneous drainage catheter injection to evaluate for residual fistulous connection.  Patient reports  minimal (less than 10 cc) of fluid from the percutaneous drainage catheter for the past 2 weeks. The patient is no longer flushing the percutaneous drainage catheter.  Note, patient has history of recurrent malignant ascites for which the patient is undergone several prior ultrasound-guided paracenteses, most recently on 06/19/2015 yielding 5 L of ascitic fluid. The patient reports minimal to no significant drainage around the right lower quadrant percutaneous drainage catheter.  EXAM: SINUS TRACT INJECTION/FISTULOGRAM  COMPARISON:  CT abdomen pelvis - 05/13/2015; 05/19/2015; 06/08/2015; CT-guided periappendiceal percutaneous drainage catheter placement - 05/13/2015  CONTRAST:  53m OMNIPAQUE IOHEXOL 300 MG/ML SOLN - injected via the existing percutaneous drainage catheter  FLUOROSCOPY TIME:  6 seconds (1.7 mGy)  TECHNIQUE: The patient was positioned supine on the fluoroscopy table.  A preprocedural spot fluoroscopic image was obtained of the right lower abdominal quadrant existing percutaneous drainage catheter.  Multiple spot fluoroscopic and radiographic images were obtained following the injection of a small amount of contrast via the percutaneous drainage catheter.  Images were reviewed and the decision was made to remove the percutaneous drainage catheter. The external portion of the percutaneous drainage catheter was cut and the drainage catheter was removed intact. A dressing was placed. The patient tolerated the procedure well without immediate postprocedural complication.  FINDINGS: Preprocedural spot fluoroscopic image demonstrates unchanged positioning of the percutaneous drainage catheter with end coiled and locked overlying the right lower abdominal quadrant.  Contrast injection demonstrated opacification of the decompressed/resolved abscess cavity with resolution of previously noted tiny fistulous connection to the adjacent appendix. Contrast courses along the catheter tract to the entrance site at  the skin surface.  IMPRESSION: Resolved periappendiceal abscess and fistulous connection to the tip of the appendix.  As such, the percutaneous drainage catheter was removed.   Electronically Signed   By: Sandi Mariscal M.D.   On: 06/23/2015 16:51    Medications: I have reviewed the patient's current medications.  Assessment/Plan: 1. 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 History of anemia, status post a nondiagnostic bone marrow biopsy 10/21/2014  3. NSTEMI March 2014  4. Admission 05/06/2015 with acute onset right abdomen pain-potentially related to acute appendicitis versus pain from carcinomatosis  CT 05/13/2015 consistent with a right lower abdomen abscess, status post catheter drainage by interventional radiology 05/13/2015  Follow-up CT 05/19/2015 showed resolution of the abscess.  Follow-up evaluation in interventional radiology 06/01/2015 showed the abscess cavity had resolved. The abscess drainage catheter communicated with the cecum via the appendix. The catheter was left in place.  CT abdomen/pelvis 06/08/2015 showed the right pelvic drain in place without recurrent or residual surrounding fluid collection. Peritoneal metastasis with slight increase in small to moderate volume of abdominal pelvic ascites.  Drainage catheter injection 06/12/2015 showed residual tiny fistulous connection with the end of the decompressed abscess cavity within the right lower abdominal quadrant and the residual lumen of the appendix.  Paracentesis 06/12/2015 with 5  liters of fluid removed  Paracentesis 06/19/2015-negative cytology and culture  Removal of abscess drainage catheter 06/23/2015  5. C. difficile colitis 05/10/2015   Disposition:  Mr. Dragoo has an improved performance status compared to when he was here last week. He has anasarca secondary to hypoalbuminemia. I encouraged him to begin nutrition supplements. He will increase his diet and activity as tolerated.  The anemia is secondary to chronic disease, malnutrition, Gleevec, and renal insufficiency.  Mr. Kimmey will continue Gleevec. He will return for an office visit in 2 weeks.  Betsy Coder, MD  06/25/2015  9:19 AM

## 2015-06-29 ENCOUNTER — Telehealth: Payer: Self-pay | Admitting: *Deleted

## 2015-06-29 NOTE — Telephone Encounter (Signed)
Call for increased adominal distention and we will arrange paracentesis

## 2015-06-29 NOTE — Telephone Encounter (Signed)
Voicemail retrieved from spouse.  "He has a lot of swelling in his stomach.  Should this be removed?"  Called [name] [name] asking if abdomen as large as it was a week ago with paracentesis.  "Almost.  He has swelling in ankles.  Does not have any pain to abdomen or feet.  No trouble breathing.  Wearing slide in shoes.  He either lies down or sits with feet elevated at times.  Continues lasix 40 mg daily.  Eats breakfast and lunch but doesn't eat in the evening."   Will notify Dr. [name].  Call tomorrow (before 10:00 am) and advised that they go to ER if any distress.  He will leave tomorrow at 10:00 to see PCP for an 11:00 appointment.  They do not have a mobile phone.  Home number for return calls is [phone].

## 2015-06-30 ENCOUNTER — Telehealth: Payer: Self-pay | Admitting: *Deleted

## 2015-06-30 DIAGNOSIS — C49A3 Gastrointestinal stromal tumor of small intestine: Secondary | ICD-10-CM

## 2015-06-30 DIAGNOSIS — E46 Unspecified protein-calorie malnutrition: Secondary | ICD-10-CM | POA: Diagnosis not present

## 2015-06-30 DIAGNOSIS — R630 Anorexia: Secondary | ICD-10-CM | POA: Diagnosis not present

## 2015-06-30 DIAGNOSIS — C494 Malignant neoplasm of connective and soft tissue of abdomen: Secondary | ICD-10-CM | POA: Diagnosis not present

## 2015-06-30 DIAGNOSIS — C762 Malignant neoplasm of abdomen: Secondary | ICD-10-CM

## 2015-06-30 NOTE — Telephone Encounter (Signed)
TC from pt's son, [name] [name]. He is asking if the paracentesis for his dad has been scheduled as patient's ascites has returned and that he has gained 5 lbs in 5 days. Order placed for paracenetesis per Dr. Gearldine Shown note from 06/29/15  Scheduled for paracentesis on 07/01/15 @ 2:30 with arrival time of 2:15 @ [facility] radiology.  TC to son, [name] [name] and made him aware of schedule. He voiced understanding and will let his dad know. Advised to call us sooner if pt becomes short of breath or develops any other symptoms before tomorrow.  He agreed.

## 2015-06-30 NOTE — Telephone Encounter (Signed)
Called this morning for clarification of abdominal swelling.  "He tossed, turned and did not sleep well last night.  Report abdomen has changed shape and getting larger.  Please enter order and schedule Paracentesis if needed.

## 2015-06-30 NOTE — Telephone Encounter (Signed)
I discussed the case with his primary physician, Dr. Maceo Pro. He saw [name] [name] today.  It is okay to arrange for a therapeutic paracentesis, fluid to be sent for cytology, if [name] [name] desires this.  Please clarify with him on 07/01/2015.

## 2015-07-01 ENCOUNTER — Ambulatory Visit (HOSPITAL_COMMUNITY)
Admission: RE | Admit: 2015-07-01 | Discharge: 2015-07-01 | Disposition: A | Discharge status: Home / Self Care | Payer: Medicare Other | Source: Ambulatory Visit | Attending: Oncology | Admitting: Oncology

## 2015-07-01 DIAGNOSIS — D649 Anemia, unspecified: Secondary | ICD-10-CM | POA: Diagnosis not present

## 2015-07-01 DIAGNOSIS — C762 Malignant neoplasm of abdomen: Secondary | ICD-10-CM

## 2015-07-01 DIAGNOSIS — C269 Malignant neoplasm of ill-defined sites within the digestive system: Secondary | ICD-10-CM | POA: Diagnosis not present

## 2015-07-01 DIAGNOSIS — E785 Hyperlipidemia, unspecified: Secondary | ICD-10-CM | POA: Diagnosis not present

## 2015-07-01 DIAGNOSIS — I5032 Chronic diastolic (congestive) heart failure: Secondary | ICD-10-CM | POA: Diagnosis not present

## 2015-07-01 DIAGNOSIS — R188 Other ascites: Secondary | ICD-10-CM | POA: Insufficient documentation

## 2015-07-01 DIAGNOSIS — I10 Essential (primary) hypertension: Secondary | ICD-10-CM | POA: Diagnosis not present

## 2015-07-01 DIAGNOSIS — C49A3 Gastrointestinal stromal tumor of small intestine: Secondary | ICD-10-CM

## 2015-07-01 DIAGNOSIS — I2510 Atherosclerotic heart disease of native coronary artery without angina pectoris: Secondary | ICD-10-CM | POA: Diagnosis not present

## 2015-07-01 NOTE — Procedures (Signed)
Ultrasound-guided diagnostic and therapeutic paracentesis performed yielding 5.3 L yellow fluid. A portion of the fluid was submitted to the lab for cytology. No immediate complications.

## 2015-07-01 NOTE — Telephone Encounter (Signed)
Called patient about today's appointment.  Patient is aware to arrive at [facility] for paracentesis at 2:15 pm.  Called Ultrasound with order to send fluid for cytology.

## 2015-07-08 ENCOUNTER — Other Ambulatory Visit (HOSPITAL_BASED_OUTPATIENT_CLINIC_OR_DEPARTMENT_OTHER): Payer: Medicare Other

## 2015-07-08 ENCOUNTER — Ambulatory Visit (HOSPITAL_BASED_OUTPATIENT_CLINIC_OR_DEPARTMENT_OTHER): Payer: Medicare Other | Admitting: Oncology

## 2015-07-08 ENCOUNTER — Telehealth: Payer: Self-pay | Admitting: Oncology

## 2015-07-08 VITALS — BP 132/53 | HR 90 | Temp 98.5°F | Resp 18 | Ht 67.0 in | Wt 160.0 lb

## 2015-07-08 DIAGNOSIS — C494 Malignant neoplasm of connective and soft tissue of abdomen: Secondary | ICD-10-CM | POA: Diagnosis not present

## 2015-07-08 DIAGNOSIS — R188 Other ascites: Secondary | ICD-10-CM | POA: Diagnosis not present

## 2015-07-08 DIAGNOSIS — C49A3 Gastrointestinal stromal tumor of small intestine: Secondary | ICD-10-CM

## 2015-07-08 DIAGNOSIS — C786 Secondary malignant neoplasm of retroperitoneum and peritoneum: Secondary | ICD-10-CM

## 2015-07-08 LAB — CBC WITH DIFFERENTIAL/PLATELET
BASO%: 0.3 % (ref 0.0–2.0)
Basophils Absolute: 0 10*3/uL (ref 0.0–0.1)
EOS ABS: 0.2 10*3/uL (ref 0.0–0.5)
EOS%: 2.9 % (ref 0.0–7.0)
HCT: 26.1 % — ABNORMAL LOW (ref 38.4–49.9)
HGB: 8.3 g/dL — ABNORMAL LOW (ref 13.0–17.1)
LYMPH%: 20.6 % (ref 14.0–49.0)
MCH: 32.5 pg (ref 27.2–33.4)
MCHC: 31.8 g/dL — ABNORMAL LOW (ref 32.0–36.0)
MCV: 102.4 fL — ABNORMAL HIGH (ref 79.3–98.0)
MONO#: 0.5 10*3/uL (ref 0.1–0.9)
MONO%: 6.9 % (ref 0.0–14.0)
NEUT%: 69.3 % (ref 39.0–75.0)
NEUTROS ABS: 5 10*3/uL (ref 1.5–6.5)
PLATELETS: 371 10*3/uL (ref 140–400)
RBC: 2.55 10*6/uL — AB (ref 4.20–5.82)
RDW: 14.6 % (ref 11.0–14.6)
WBC: 7.3 10*3/uL (ref 4.0–10.3)
lymph#: 1.5 10*3/uL (ref 0.9–3.3)

## 2015-07-08 LAB — COMPREHENSIVE METABOLIC PANEL (CC13)
ALK PHOS: 105 U/L (ref 40–150)
ALT: 34 U/L (ref 0–55)
AST: 27 U/L (ref 5–34)
Albumin: 1.7 g/dL — ABNORMAL LOW (ref 3.5–5.0)
Anion Gap: 7 mEq/L (ref 3–11)
BUN: 23.3 mg/dL (ref 7.0–26.0)
CO2: 25 meq/L (ref 22–29)
Calcium: 8.3 mg/dL — ABNORMAL LOW (ref 8.4–10.4)
Chloride: 108 mEq/L (ref 98–109)
Creatinine: 1.5 mg/dL — ABNORMAL HIGH (ref 0.7–1.3)
EGFR: 44 mL/min/{1.73_m2} — ABNORMAL LOW (ref >90)
GLUCOSE: 111 mg/dL (ref 70–140)
Potassium: 3.8 mEq/L (ref 3.5–5.1)
Sodium: 141 mEq/L (ref 136–145)
Total Bilirubin: 0.29 mg/dL (ref 0.20–1.20)
Total Protein: 5.9 g/dL — ABNORMAL LOW (ref 6.4–8.3)

## 2015-07-08 NOTE — Telephone Encounter (Signed)
Gave and printed appt sched and avs for pt for Aug °

## 2015-07-08 NOTE — Progress Notes (Signed)
  [name] OFFICE PROGRESS NOTE   Diagnosis: Gastrointestinal stromal tumor  INTERVAL HISTORY:   [name] [name] returns as scheduled. He underwent a paracentesis 07/01/2015 for 5 L of fluid. The cytology revealed no malignant cells.  He has noted an improved appetite since starting Megace. The abdominal distention has partially recurred. He has a good energy level. He has returned to exercising. He continues [place].  Objective:  Vital signs in last 24 hours:  Blood pressure 132/53, pulse 90, temperature 98.5 F (36.9 C), temperature source Oral, resp. rate 18, height _0  (1.702 m), weight 160 lb (72.576 kg), SpO2 98 %.    HEENT: No thrush or ulcers Resp: Decreased breath sounds at the left lower chest, no respiratory distress Cardio: Frequent premature beats GI: Mildly distended, nontender, no mass Vascular: Anasarca with 1+ edema throughout the leg bilaterally   Lab Results:  Lab Results  Component Value Date   WBC 7.3 07/08/2015   HGB 8.3* 07/08/2015   HCT 26.1* 07/08/2015   MCV 102.4* 07/08/2015   PLT 371 07/08/2015   NEUTROABS 5.0 07/08/2015   Potassium 3.8, creatinine 1.5, albumen 1.7  Medications: I have reviewed the patient's current medications.  Assessment/Plan: 1. 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 History of anemia, status post a nondiagnostic bone marrow biopsy 10/21/2014  3. NSTEMI March 2014  4. Admission 05/06/2015 with acute onset right abdomen pain-potentially related to acute appendicitis  versus pain from carcinomatosis  CT 05/13/2015 consistent with a right lower abdomen abscess, status post catheter drainage by interventional radiology 05/13/2015  Follow-up CT 05/19/2015 showed resolution of the abscess.  Follow-up evaluation in interventional radiology 06/01/2015 showed the abscess cavity had resolved. The abscess drainage catheter communicated with the cecum via the appendix. The catheter was left in place.  CT abdomen/pelvis 06/08/2015 showed the right pelvic drain in place without recurrent or residual surrounding fluid collection. Peritoneal metastasis with slight increase in small to moderate volume of abdominal pelvic ascites.  Drainage catheter injection 06/12/2015 showed residual tiny fistulous connection with the end of the decompressed abscess cavity within the right lower abdominal quadrant and the residual lumen of the appendix.  Paracentesis 06/12/2015 with 5 liters of fluid removed  Paracentesis 06/19/2015-negative cytology and culture  Removal of abscess drainage catheter 06/23/2015  5. C. difficile colitis 05/10/2015    Disposition:  [name] [name] has an improved performance status. He has persistent anasarca and ascites. This is most likely secondary to hypoalbuminemia and renal insufficiency. He will continue to have therapeutic paracentesis procedures as needed. [name] [name] will work on his protein intake and continue exercising. He will return for an office visit in 3 weeks.  The anemia is secondary to chronic disease, Gleevec, and renal insufficiency. He does not appear symptomatic from the anemia at present. We will consider a trial of erythropoietin if the hemoglobin falls or he becomes symptomatic.    Betsy Coder, MD  07/08/2015  9:25 AM

## 2015-07-14 ENCOUNTER — Telehealth: Payer: Self-pay | Admitting: *Deleted

## 2015-07-14 ENCOUNTER — Other Ambulatory Visit: Payer: Self-pay | Admitting: General Surgery

## 2015-07-14 ENCOUNTER — Other Ambulatory Visit: Payer: Self-pay | Admitting: Physician Assistant

## 2015-07-14 ENCOUNTER — Ambulatory Visit (HOSPITAL_COMMUNITY)
Admission: RE | Admit: 2015-07-14 | Discharge: 2015-07-14 | Disposition: A | Discharge status: Home / Self Care | Payer: Medicare Other | Source: Ambulatory Visit | Attending: Oncology | Admitting: Oncology

## 2015-07-14 DIAGNOSIS — R188 Other ascites: Secondary | ICD-10-CM | POA: Diagnosis not present

## 2015-07-14 DIAGNOSIS — L570 Actinic keratosis: Secondary | ICD-10-CM | POA: Diagnosis not present

## 2015-07-14 DIAGNOSIS — D225 Melanocytic nevi of trunk: Secondary | ICD-10-CM | POA: Diagnosis not present

## 2015-07-14 DIAGNOSIS — Z08 Encounter for follow-up examination after completed treatment for malignant neoplasm: Secondary | ICD-10-CM | POA: Diagnosis not present

## 2015-07-14 DIAGNOSIS — C44310 Basal cell carcinoma of skin of unspecified parts of face: Secondary | ICD-10-CM | POA: Diagnosis not present

## 2015-07-14 DIAGNOSIS — C49A3 Gastrointestinal stromal tumor of small intestine: Secondary | ICD-10-CM

## 2015-07-14 DIAGNOSIS — I781 Nevus, non-neoplastic: Secondary | ICD-10-CM | POA: Diagnosis not present

## 2015-07-14 DIAGNOSIS — K3533 Acute appendicitis with perforation and localized peritonitis, with abscess: Secondary | ICD-10-CM

## 2015-07-14 DIAGNOSIS — X32XXXD Exposure to sunlight, subsequent encounter: Secondary | ICD-10-CM | POA: Diagnosis not present

## 2015-07-14 DIAGNOSIS — Z85828 Personal history of other malignant neoplasm of skin: Secondary | ICD-10-CM | POA: Diagnosis not present

## 2015-07-14 DIAGNOSIS — D485 Neoplasm of uncertain behavior of skin: Secondary | ICD-10-CM | POA: Diagnosis not present

## 2015-07-14 MED ORDER — LIDOCAINE HCL (PF) 1 % IJ SOLN
INTRAMUSCULAR | Status: AC
  Filled 2015-07-14: qty 10

## 2015-07-14 NOTE — Telephone Encounter (Signed)
Received voice message from pt's son Ronalee Belts requesting therapeutic paracentesis d/t increase in abdominal distension.  Per Dr. [name]; notified pt that [facility] would be able to do today @ 1130.  Pt verbalized understanding and states he can be at [facility] @ 1130.

## 2015-07-14 NOTE — Progress Notes (Signed)
During paracentesis today, patient complained of continued drainage from the right lower quadrant.  He recently had his pigtail drain removed by Dr. Pascal Lux.  This was removed on 06/23/15.  It was originally placed on 5/25.  The drainage is purulent in nature, it is not ascites.  I discussed this with Dr. Pascal Lux  We will obtain a CT scan of the abdomen and pelvis to evaluate for new abscess formation.  Gareth Eagle, PA-C

## 2015-07-14 NOTE — Procedures (Signed)
Successful US guided paracentesis from left lower quadrant.  Yielded 3.1 liters of clear yellow fluid.  No immediate complications.  Pt tolerated well.   Specimen was not sent for labs.  WENDY S BLAIR PA-C 07/14/2015 12:07 PM

## 2015-07-15 DIAGNOSIS — I10 Essential (primary) hypertension: Secondary | ICD-10-CM | POA: Diagnosis not present

## 2015-07-15 DIAGNOSIS — I2510 Atherosclerotic heart disease of native coronary artery without angina pectoris: Secondary | ICD-10-CM | POA: Diagnosis not present

## 2015-07-15 DIAGNOSIS — E785 Hyperlipidemia, unspecified: Secondary | ICD-10-CM | POA: Diagnosis not present

## 2015-07-15 DIAGNOSIS — D649 Anemia, unspecified: Secondary | ICD-10-CM | POA: Diagnosis not present

## 2015-07-15 DIAGNOSIS — C269 Malignant neoplasm of ill-defined sites within the digestive system: Secondary | ICD-10-CM | POA: Diagnosis not present

## 2015-07-15 DIAGNOSIS — I5032 Chronic diastolic (congestive) heart failure: Secondary | ICD-10-CM | POA: Diagnosis not present

## 2015-07-16 ENCOUNTER — Ambulatory Visit
Admission: RE | Admit: 2015-07-16 | Discharge: 2015-07-16 | Disposition: A | Discharge status: Home / Self Care | Payer: Medicare Other | Source: Ambulatory Visit | Attending: Interventional Radiology | Admitting: Interventional Radiology

## 2015-07-16 ENCOUNTER — Ambulatory Visit
Admission: RE | Admit: 2015-07-16 | Discharge: 2015-07-16 | Disposition: A | Discharge status: Home / Self Care | Payer: Medicare Other | Source: Ambulatory Visit | Attending: General Surgery | Admitting: General Surgery

## 2015-07-16 ENCOUNTER — Other Ambulatory Visit (HOSPITAL_COMMUNITY): Payer: Self-pay | Admitting: Interventional Radiology

## 2015-07-16 DIAGNOSIS — K353 Acute appendicitis with localized peritonitis: Secondary | ICD-10-CM | POA: Diagnosis not present

## 2015-07-16 DIAGNOSIS — C772 Secondary and unspecified malignant neoplasm of intra-abdominal lymph nodes: Secondary | ICD-10-CM | POA: Diagnosis not present

## 2015-07-16 DIAGNOSIS — K3533 Acute appendicitis with perforation and localized peritonitis, with abscess: Secondary | ICD-10-CM

## 2015-07-16 DIAGNOSIS — R590 Localized enlarged lymph nodes: Secondary | ICD-10-CM | POA: Diagnosis not present

## 2015-07-16 DIAGNOSIS — K4090 Unilateral inguinal hernia, without obstruction or gangrene, not specified as recurrent: Secondary | ICD-10-CM | POA: Diagnosis not present

## 2015-07-16 DIAGNOSIS — Z8509 Personal history of malignant neoplasm of other digestive organs: Secondary | ICD-10-CM | POA: Diagnosis not present

## 2015-07-16 MED ORDER — IOPAMIDOL (ISOVUE-300) INJECTION 61%
50.0000 mL | Freq: Once | INTRAVENOUS | Status: AC | PRN
  Administered 2015-07-16: 50 mL via INTRAVENOUS

## 2015-07-16 NOTE — Progress Notes (Signed)
Chief Complaint: Patient was seen in consultation today for  Chief Complaint  Patient presents with  . Follow-up   at the request of McCullough,Heath  Referring Physician(s): McCullough,Heath  History of Present Illness: [name] [name] is a 81 y.o. male with history of malignant GIST peritoneal carcinomatosis was instilling noted to have a ruptured appendicitis on CT scan performed 05/13/2015 for which the patient underwent a technically successful CT-guided percutaneous drainage catheter placement on 05/13/2015. Subsequent CT scans has demonstrated complete resolution of the periappendiceal abscess, however catheter injection performed 06/01/2015 and 06/12/2015 demonstrated a residual fistulous connection.  Follow-up catheter injection on 06/23/2015 demonstrated resolution of the fistulous connection. The tube was removed at that time. The patient presents today complaining of persistent drainage from the catheter tube site since the time of tube removal. The patient changes his bandage daily sometimes with the assistance of his home health nurse. The drainage fluctuates and is sometimes copious and sometimes minimal however there has been no significant tapering or resolution. He is asymptomatic, he denies pain, nausea, vomiting, fever or chills.  Drainage is brown and thick occasionally with a foul odor.  Past Medical History  Diagnosis Date  . Hyperlipidemia     TAKES zOCOR DAILY  . Abdominal mass 02/2013  . UTI (urinary tract infection)   . Hypertension     TAKES LOTREL,HCTZ,AND METOPROLOL DAILY  . NSTEMI (non-ST elevated myocardial infarction) 02/2013    "LIGHT: ONE MD SAID HE DID AND ONE SAID HE DIDN'T  . Pneumonia     MAR 2014  . Stromal tumor of digestive system   . CAD (coronary artery disease)   . Shortness of breath   . Acute respiratory failure with hypoxia 09/12/2014  . Hemorrhagic shock 09/13/2014  . Intraperitoneal hemorrhage 09/17/2014  . CKD (chronic kidney  disease), stage III     Past Surgical History  Procedure Laterality Date  . Cyst excision  1962    wrist  . Cardiac catheterization  03-05-13  . Laparotomy N/A 06/04/2013    Procedure: EXPLORATORY LAPAROTOMY, OPEN RESECTION OF MESENTERIC AND INTESTINAL MASS;  Surgeon: [name], MD;  Location: [facility];  Service: General;  Laterality: N/A;  Small Bowel Resection  . Colonoscopy  09/08/2014    dr scooler  . Left heart catheterization with coronary angiogram N/A 03/05/2013    Procedure: LEFT HEART CATHETERIZATION WITH CORONARY ANGIOGRAM;  Surgeon: [name], MD;  Location: [facility] CATH LAB;  Service: Cardiovascular;  Laterality: N/A;    Allergies: Review of patient's allergies indicates no known allergies.  Medications: Prior to Admission medications   Medication Sig Start Date End Date Taking? Authorizing Provider  amLODipine (NORVASC) 5 MG tablet Take 5 mg by mouth daily. 03/17/15   Historical Provider, MD  furosemide (LASIX) 20 MG tablet Take 1 tablet (20 mg total) by mouth daily. 05/20/15   Hosie Poisson, MD  imatinib (GLEEVEC) 400 MG tablet Take 1 tablet (400 mg total) by mouth daily. Take with meals and large glass of water.Caution:Chemotherapy. 02/13/15   Ladell Pier, MD  megestrol (MEGACE) 40 MG/ML suspension Take 40 mg by mouth 2 (two) times daily.    Historical Provider, MD  metoprolol tartrate (LOPRESSOR) 25 MG tablet Take 1 tablet (25 mg total) by mouth 2 (two) times daily. 03/06/13   Costin Karlyne Greenspan, MD  mirtazapine (REMERON) 15 MG tablet Take 15 mg by mouth at bedtime.    Historical Provider, MD  saccharomyces boulardii (FLORASTOR) 250 MG capsule Take 1  capsule (250 mg total) by mouth 2 (two) times daily. 06/25/15   Ladell Pier, MD  simvastatin (ZOCOR) 20 MG tablet Take 20 mg by mouth every evening.    Historical Provider, MD     Family History  Problem Relation Age of Onset  . Skin cancer Brother     History   Social History  . Marital Status: Married    Spouse  Name: [name]  . Number of Children: 2  . Years of Education: N/A   Occupational History  . Retired     Writer   Social History Main Topics  . Smoking status: Never Smoker   . Smokeless tobacco: Never Used  . Alcohol Use: No  . Drug Use: No  . Sexual Activity: No   Other Topics Concern  . Not on file   Social History Narrative   Married, wife [name]   Retired Environmental manager for 11 years   Independent in [place]   #2 grown sons    Review of Systems: A 12 point ROS discussed and pertinent positives are indicated in the HPI above.  All other systems are negative.  Review of Systems  Vital Signs: BP 137/52 mmHg  Pulse 84  Temp(Src) 98.1 F (36.7 C) (Oral)  Resp 14  Ht 5\' 7"  (1.702 m)  Wt 158 lb (71.668 kg)  BMI 24.74 kg/m2  SpO2 99%  Physical Exam  Constitutional: He appears well-developed and well-nourished. No distress.  HENT:  Head: Normocephalic and atraumatic.  Eyes: No scleral icterus.  Cardiovascular: Normal rate.   Pulmonary/Chest: Effort normal.  Abdominal:    Gauze bandage coated in thick brownish discharge.  Mildly foul odor. Drain exit site open with minimal discharge at this time.  No cellulitis.  Non tender, no fluctuance or induration.   Nursing note and vitals reviewed.   Imaging: Ct Abdomen Pelvis W Contrast  07/16/2015   CLINICAL DATA:  Followup appendix abscess, drain has been removed, appendix still in place, history of gastrointestinal stromal tumor with metastasis October 2015 status post chemotherapy, currently taking Gleevec  EXAM: CT ABDOMEN AND PELVIS WITH CONTRAST  TECHNIQUE: Multidetector CT imaging of the abdomen and pelvis was performed using the standard protocol following bolus administration of intravenous contrast.  CONTRAST:  65mL ISOVUE-300 IOPAMIDOL (ISOVUE-300) INJECTION 61%  COMPARISON:  06/08/2015  FINDINGS: Lower chest: Increase in the size of a now moderate to large left pleural effusion, with underlying  compressive atelectasis. 4 mm pulmonary nodule right lung base image number 8 stable.  Hepatobiliary: No focal hepatic abnormalities. Gallbladder is contracted. Increased attenuation inferiorly within the gallbladder suggests possibility of cholelithiasis as previously described.  Pancreas: Normal  Spleen: Normal  Adrenals/Urinary Tract: Normal adrenal glands.  Kidneys are normal.  Stomach/Bowel: Small bowel anastomosis anteriorly in the midline as seen on prior study. No abnormally dilated loops of bowel to suggest obstruction. Anastomosis remains adjacent to the anterior abdominal wall and adhesions may be present. Diverticulosis distal colon. The appendix measures 17 mm in diameter with wall thickening.  Vascular/Lymphatic: Atherosclerotic calcification of the aortoiliac vessels. 1 cm right iliac adenopathy image number 51 as previously described possibly representing metastasis.  Reproductive: Small bowel and fluid again seen into a right inguinal hernia with no evidence of obstruction or strangulation.  Other: There is a small volume of ascites present, decreased when compared to prior study with reported intervening paracentesis. Increased attenuation within the ascites anterior left abdomen upper quadrant and right upper quadrant consistent  with peritoneal metastasis, stable. Peritoneal thickening within the right inguinal canal suggests metastasis as well. No recurrent abscess in the right lower quadrant status post removal of percutaneous catheter.  Musculoskeletal: No acute findings  IMPRESSION: 1. Increased pleural effusion on the left 2. Status post paracentesis decreased ascites. Persistent evidence of peritoneal metastasis. 3. Abnormally dilated appendix with wall thickening consistent with known inflammation. No abscess status post removal of drainage catheter. 4. Right inguinal hernia with evidence of involvement with metastasis 5. Right common iliac adenopathy concerning for metastasis 6. Other  nonacute findings described above   Electronically Signed   By: Skipper Cliche M.D.   On: 07/16/2015 16:26   Ir Sinus/fist Tube Chk-non Gi  06/23/2015   CLINICAL DATA:  History of malignant GIST with peritoneal carcinomatosis, incidentally noted to have a ruptured appendicitis CT scan performed 05/13/2015 for which the patient underwent a technically successful CT-guided percutaneous drainage catheter (also on 05/13/2015).  Subsequent CT scan demonstrated complete resolution of the periappendiceal abscess, however contrast injections performed both 06/01/2015 and 06/12/2015 demonstrated a residual fistulous connections.  Patient returns to the Interventional Radiology drain Clinic for repeat fluoroscopic guided percutaneous drainage catheter injection to evaluate for residual fistulous connection.  Patient reports minimal (less than 10 cc) of fluid from the percutaneous drainage catheter for the past 2 weeks. The patient is no longer flushing the percutaneous drainage catheter.  Note, patient has history of recurrent malignant ascites for which the patient is undergone several prior ultrasound-guided paracenteses, most recently on 06/19/2015 yielding 5 L of ascitic fluid. The patient reports minimal to no significant drainage around the right lower quadrant percutaneous drainage catheter.  EXAM: SINUS TRACT INJECTION/FISTULOGRAM  COMPARISON:  CT abdomen pelvis - 05/13/2015; 05/19/2015; 06/08/2015; CT-guided periappendiceal percutaneous drainage catheter placement - 05/13/2015  CONTRAST:  49mL OMNIPAQUE IOHEXOL 300 MG/ML SOLN - injected via the existing percutaneous drainage catheter  FLUOROSCOPY TIME:  6 seconds (1.7 mGy)  TECHNIQUE: The patient was positioned supine on the fluoroscopy table.  A preprocedural spot fluoroscopic image was obtained of the right lower abdominal quadrant existing percutaneous drainage catheter.  Multiple spot fluoroscopic and radiographic images were obtained following the injection  of a small amount of contrast via the percutaneous drainage catheter.  Images were reviewed and the decision was made to remove the percutaneous drainage catheter. The external portion of the percutaneous drainage catheter was cut and the drainage catheter was removed intact. A dressing was placed. The patient tolerated the procedure well without immediate postprocedural complication.  FINDINGS: Preprocedural spot fluoroscopic image demonstrates unchanged positioning of the percutaneous drainage catheter with end coiled and locked overlying the right lower abdominal quadrant.  Contrast injection demonstrated opacification of the decompressed/resolved abscess cavity with resolution of previously noted tiny fistulous connection to the adjacent appendix. Contrast courses along the catheter tract to the entrance site at the skin surface.  IMPRESSION: Resolved periappendiceal abscess and fistulous connection to the tip of the appendix. As such, the percutaneous drainage catheter was removed.   Electronically Signed   By: Sandi Mariscal M.D.   On: 06/23/2015 16:51   US Paracentesis  07/14/2015   CLINICAL DATA:  Ascites secondary to gastrointestinal stromal tumor  EXAM: ULTRASOUND GUIDED LEFT LOWER QUADRANT PARACENTESIS  COMPARISON:  None.  PROCEDURE: An ultrasound guided paracentesis was thoroughly discussed with the patient and questions answered. The benefits, risks, alternatives and complications were also discussed. The patient understands and wishes to proceed with the procedure. Written consent was obtained.  Ultrasound was performed  to localize and mark an adequate pocket of fluid in the left lower quadrant of the abdomen. The area was then prepped and draped in the normal sterile fashion. 1% Lidocaine was used for local anesthesia. Under ultrasound guidance a 19 gauge Yueh catheter was introduced. Paracentesis was performed. The catheter was removed and a dressing applied.  COMPLICATIONS: None.  FINDINGS: A total  of approximately 3.1 liters of clear yellow fluid was removed. A fluid sample was not sent for laboratory analysis.  IMPRESSION: Successful ultrasound guided paracentesis yielding 3.1 liters of ascites.  Read by:  Gareth Eagle, PA-C   Electronically Signed   By: Sandi Mariscal M.D.   On: 07/14/2015 13:22   US Paracentesis  07/01/2015   INDICATION: Patient with history of gastrointestinal stromal tumor of small bowel, abdominal carcinomatosis, previously drained appendiceal abscess, ascites. Request is made for diagnostic and therapeutic paracentesis.  EXAM: ULTRASOUND-GUIDED DIAGNOSTIC AND THERAPEUTIC PARACENTESIS  COMPARISON:  Prior paracentesis on 06/19/2015  MEDICATIONS: None.  COMPLICATIONS: None immediate  TECHNIQUE: Informed written consent was obtained from the patient after a discussion of the risks, benefits and alternatives to treatment. A timeout was performed prior to the initiation of the procedure.  Initial ultrasound scanning demonstrates a large amount of ascites within the left lower abdominal quadrant. The left lower abdomen was prepped and draped in the usual sterile fashion. 1% lidocaine was used for local anesthesia. Under direct ultrasound guidance, a 19 gauge, 10-cm, Yueh catheter was introduced. An ultrasound image was saved for documentation purposed. The paracentesis was performed. The catheter was removed and a dressing was applied. The patient tolerated the procedure well without immediate post procedural complication.  FINDINGS: A total of approximately 5.3 liters of yellow fluid was removed. Samples were sent to the laboratory as requested by the clinical team.  IMPRESSION: Successful ultrasound-guided diagnostic and therapeutic paracentesis yielding 5.3 liters of peritoneal fluid.  Read by: Rowe Robert, PA-C   Electronically Signed   By: Sandi Mariscal M.D.   On: 07/01/2015 15:48   US Paracentesis  06/19/2015   INDICATION: Patient with history of gastrointestinal stromal tumor of small  bowel, abdominal carcinomatosis, drained appendiceal abscess, ascites. Request is made for diagnostic and therapeutic paracentesis.  EXAM: ULTRASOUND-GUIDED DIAGNOSTIC AND THERAPEUTIC PARACENTESIS  COMPARISON:  PRIOR PARACENTESIS ON 06/12/2015  MEDICATIONS: None.  COMPLICATIONS: None immediate  TECHNIQUE: Informed written consent was obtained from the patient after a discussion of the risks, benefits and alternatives to treatment. A timeout was performed prior to the initiation of the procedure.  Initial ultrasound scanning demonstrates a large amount of ascites within the left lower abdominal quadrant. The left lower abdomen was prepped and draped in the usual sterile fashion. 1% lidocaine was used for local anesthesia. Under direct ultrasound guidance, a 19 gauge, 10-cm, Yueh catheter was introduced. An ultrasound image was saved for documentation purposed. The paracentesis was performed. The catheter was removed and a dressing was applied. The patient tolerated the procedure well without immediate post procedural complication.  FINDINGS: A total of approximately 4.9 liters of clear, yellow fluid was removed. Samples were sent to the laboratory as requested by the clinical team.  IMPRESSION: Successful ultrasound-guided diagnostic and therapeutic paracentesis yielding 4.9 liters of peritoneal fluid.  Read by: Rowe Robert, PA-C   Electronically Signed   By: Markus Daft M.D.   On: 06/19/2015 13:02    Labs:  CBC:  Recent Labs  05/17/15 0553 05/25/15 1343 06/25/15 0835 07/08/15 0828  WBC 9.5 10.4* 8.7 7.3  HGB 8.3* 8.8* 8.5* 8.3*  HCT 25.7* 25.7* 25.4* 26.1*  PLT 467* 344 368 371    COAGS:  Recent Labs  10/16/14 1626 10/21/14 0820 01/13/15 0100 05/06/15 0304  INR 1.09 1.07 1.05 1.14  APTT  --  29  --   --     BMP:  Recent Labs  05/10/15 0537 05/11/15 0546 05/14/15 0430 05/17/15 0553 05/25/15 1343 06/25/15 0835 07/08/15 0828  NA 143 141 140 139 140 139 141  K 4.2 4.1 4.4 3.7  3.8 3.8 3.8  CL 113* 110 111 110  --   --   --   CO2 23 24 23 23 27 28 25   GLUCOSE 113* 114* 99 107* 142* 148* 111  BUN 28* 28* 22* 16 14.2 27.7* 23.3  CALCIUM 8.6* 8.3* 7.6* 7.5* 8.0* 7.9* 8.3*  CREATININE 1.19 1.18 1.08 1.04 1.2 1.4* 1.5*  GFRNONAA 54* 55* >60 >60  --   --   --   GFRAA >60 >60 >60 >60  --   --   --     LIVER FUNCTION TESTS:  Recent Labs  05/14/15 0430 05/25/15 1343 06/25/15 0835 07/08/15 0828  BILITOT 0.9 0.28 0.28 0.29  AST 20 20 28 27   ALT 23 12 32 34  ALKPHOS 69 56 95 105  PROT 5.2* 5.8* 5.5* 5.9*  ALBUMIN 2.1* 2.4* 1.7* 1.7*    TUMOR MARKERS: No results for input(s): AFPTM, CEA, CA199, CHROMGRNA in the last 8760 hours.  Assessment and Plan:  Persistent but asymptomatic drainage from tube exit site since 06/23/2015. Drainage appears purulent, or perhaps representative of enteric contents. CT scan performed today demonstrates no recurrent intra-abdominal abscess. However, there is thickening along the prior tumor tract leading toward the region of the appendix.  This may represent persistent appendiceal cutaneous fistula versus a granulated and patent soft tissue tract with leaking of fibrinous exudate.  1.) One-week trial of antibiotics with Cipro 250 mg and Flagyl 500 mg twice a day.   2.) If the drainage stops and skin tract heals, patient need not return to clinic.  If, however, there is persistent drainage at 1 week he will call and be scheduled to come in to interventional radiology at the hospital for tract injection and possible sclerosis with Betadine.  SignedJacqulynn Cadet 07/16/2015, 5:51 PM   I spent a total of   10 Minutes in face to face in clinical consultation, greater than 50% of which was counseling/coordinating care for draining skin tract.

## 2015-07-20 DIAGNOSIS — R600 Localized edema: Secondary | ICD-10-CM | POA: Diagnosis not present

## 2015-07-20 DIAGNOSIS — E782 Mixed hyperlipidemia: Secondary | ICD-10-CM | POA: Diagnosis not present

## 2015-07-20 DIAGNOSIS — I10 Essential (primary) hypertension: Secondary | ICD-10-CM | POA: Diagnosis not present

## 2015-07-20 DIAGNOSIS — R630 Anorexia: Secondary | ICD-10-CM | POA: Diagnosis not present

## 2015-07-26 ENCOUNTER — Emergency Department (HOSPITAL_COMMUNITY): Payer: Medicare Other

## 2015-07-26 ENCOUNTER — Inpatient Hospital Stay (HOSPITAL_COMMUNITY)
Admission: EM | Admit: 2015-07-26 | Discharge: 2015-07-29 | DRG: 871 | Disposition: A | Discharge status: Home / Self Care | Payer: Medicare Other | Attending: Internal Medicine | Admitting: Internal Medicine

## 2015-07-26 ENCOUNTER — Encounter (HOSPITAL_COMMUNITY): Payer: Self-pay | Admitting: Emergency Medicine

## 2015-07-26 DIAGNOSIS — K353 Acute appendicitis with localized peritonitis: Secondary | ICD-10-CM | POA: Diagnosis not present

## 2015-07-26 DIAGNOSIS — E43 Unspecified severe protein-calorie malnutrition: Secondary | ICD-10-CM | POA: Diagnosis present

## 2015-07-26 DIAGNOSIS — L03311 Cellulitis of abdominal wall: Secondary | ICD-10-CM | POA: Diagnosis present

## 2015-07-26 DIAGNOSIS — N183 Chronic kidney disease, stage 3 unspecified: Secondary | ICD-10-CM | POA: Diagnosis present

## 2015-07-26 DIAGNOSIS — Z79899 Other long term (current) drug therapy: Secondary | ICD-10-CM

## 2015-07-26 DIAGNOSIS — IMO0002 Reserved for concepts with insufficient information to code with codable children: Secondary | ICD-10-CM | POA: Diagnosis present

## 2015-07-26 DIAGNOSIS — I2510 Atherosclerotic heart disease of native coronary artery without angina pectoris: Secondary | ICD-10-CM | POA: Diagnosis present

## 2015-07-26 DIAGNOSIS — C786 Secondary malignant neoplasm of retroperitoneum and peritoneum: Secondary | ICD-10-CM | POA: Diagnosis not present

## 2015-07-26 DIAGNOSIS — I252 Old myocardial infarction: Secondary | ICD-10-CM | POA: Diagnosis not present

## 2015-07-26 DIAGNOSIS — E8770 Fluid overload, unspecified: Secondary | ICD-10-CM | POA: Diagnosis present

## 2015-07-26 DIAGNOSIS — A047 Enterocolitis due to Clostridium difficile: Secondary | ICD-10-CM | POA: Diagnosis not present

## 2015-07-26 DIAGNOSIS — R140 Abdominal distension (gaseous): Secondary | ICD-10-CM

## 2015-07-26 DIAGNOSIS — R188 Other ascites: Secondary | ICD-10-CM | POA: Diagnosis not present

## 2015-07-26 DIAGNOSIS — R109 Unspecified abdominal pain: Secondary | ICD-10-CM | POA: Diagnosis not present

## 2015-07-26 DIAGNOSIS — L02211 Cutaneous abscess of abdominal wall: Secondary | ICD-10-CM | POA: Diagnosis not present

## 2015-07-26 DIAGNOSIS — I129 Hypertensive chronic kidney disease with stage 1 through stage 4 chronic kidney disease, or unspecified chronic kidney disease: Secondary | ICD-10-CM | POA: Diagnosis present

## 2015-07-26 DIAGNOSIS — D649 Anemia, unspecified: Secondary | ICD-10-CM | POA: Diagnosis not present

## 2015-07-26 DIAGNOSIS — C494 Malignant neoplasm of connective and soft tissue of abdomen: Secondary | ICD-10-CM | POA: Diagnosis not present

## 2015-07-26 DIAGNOSIS — R1031 Right lower quadrant pain: Secondary | ICD-10-CM | POA: Diagnosis not present

## 2015-07-26 DIAGNOSIS — Z6825 Body mass index (BMI) 25.0-25.9, adult: Secondary | ICD-10-CM | POA: Diagnosis not present

## 2015-07-26 DIAGNOSIS — K651 Peritoneal abscess: Secondary | ICD-10-CM | POA: Diagnosis not present

## 2015-07-26 DIAGNOSIS — Z808 Family history of malignant neoplasm of other organs or systems: Secondary | ICD-10-CM

## 2015-07-26 DIAGNOSIS — E785 Hyperlipidemia, unspecified: Secondary | ICD-10-CM | POA: Diagnosis present

## 2015-07-26 DIAGNOSIS — C179 Malignant neoplasm of small intestine, unspecified: Secondary | ICD-10-CM | POA: Diagnosis not present

## 2015-07-26 DIAGNOSIS — C49A3 Gastrointestinal stromal tumor of small intestine: Secondary | ICD-10-CM

## 2015-07-26 DIAGNOSIS — L988 Other specified disorders of the skin and subcutaneous tissue: Secondary | ICD-10-CM | POA: Diagnosis not present

## 2015-07-26 DIAGNOSIS — Z823 Family history of stroke: Secondary | ICD-10-CM

## 2015-07-26 DIAGNOSIS — K3533 Acute appendicitis with perforation and localized peritonitis, with abscess: Secondary | ICD-10-CM | POA: Diagnosis present

## 2015-07-26 DIAGNOSIS — A419 Sepsis, unspecified organism: Principal | ICD-10-CM

## 2015-07-26 LAB — COMPREHENSIVE METABOLIC PANEL
ALBUMIN: 1.9 g/dL — AB (ref 3.5–5.0)
ALK PHOS: 129 U/L — AB (ref 38–126)
ALT: 37 U/L (ref 17–63)
ALT: 28 U/L (ref 17–63)
ANION GAP: 2 — AB (ref 5–15)
AST: 34 U/L (ref 15–41)
AST: 27 U/L (ref 15–41)
Albumin: 2.2 g/dL — ABNORMAL LOW (ref 3.5–5.0)
Alkaline Phosphatase: 100 U/L (ref 38–126)
Anion gap: 7 (ref 5–15)
BILIRUBIN TOTAL: 0.3 mg/dL (ref 0.3–1.2)
BILIRUBIN TOTAL: 0.4 mg/dL (ref 0.3–1.2)
BUN: 18 mg/dL (ref 6–20)
BUN: 15 mg/dL (ref 6–20)
CALCIUM: 7.9 mg/dL — AB (ref 8.9–10.3)
CALCIUM: 7.2 mg/dL — AB (ref 8.9–10.3)
CHLORIDE: 111 mmol/L (ref 101–111)
CO2: 22 mmol/L (ref 22–32)
CO2: 22 mmol/L (ref 22–32)
Chloride: 113 mmol/L — ABNORMAL HIGH (ref 101–111)
Creatinine, Ser: 1.24 mg/dL (ref 0.61–1.24)
Creatinine, Ser: 1.18 mg/dL (ref 0.61–1.24)
GFR calc Af Amer: >60 mL/min (ref >60)
GFR calc non Af Amer: 55 mL/min — ABNORMAL LOW (ref >60)
GFR, EST AFRICAN AMERICAN: 60 mL/min — AB (ref >60)
GFR, EST NON AFRICAN AMERICAN: 52 mL/min — AB (ref >60)
GLUCOSE: 123 mg/dL — AB (ref 65–99)
GLUCOSE: 96 mg/dL (ref 65–99)
POTASSIUM: 3.6 mmol/L (ref 3.5–5.1)
Potassium: 3.7 mmol/L (ref 3.5–5.1)
SODIUM: 140 mmol/L (ref 135–145)
Sodium: 137 mmol/L (ref 135–145)
TOTAL PROTEIN: 6.4 g/dL — AB (ref 6.5–8.1)
Total Protein: 5.3 g/dL — ABNORMAL LOW (ref 6.5–8.1)

## 2015-07-26 LAB — CBC WITH DIFFERENTIAL/PLATELET
BASOS PCT: 0 % (ref 0–1)
Basophils Absolute: 0 10*3/uL (ref 0.0–0.1)
Basophils Absolute: 0 10*3/uL (ref 0.0–0.1)
Basophils Relative: 0 % (ref 0–1)
EOS ABS: 0.2 10*3/uL (ref 0.0–0.7)
EOS PCT: 2 % (ref 0–5)
Eosinophils Absolute: 0.2 10*3/uL (ref 0.0–0.7)
Eosinophils Relative: 1 % (ref 0–5)
HEMATOCRIT: 30.4 % — AB (ref 39.0–52.0)
HEMATOCRIT: 26.6 % — AB (ref 39.0–52.0)
Hemoglobin: 9.5 g/dL — ABNORMAL LOW (ref 13.0–17.0)
Hemoglobin: 8.5 g/dL — ABNORMAL LOW (ref 13.0–17.0)
LYMPHS PCT: 9 % — AB (ref 12–46)
Lymphocytes Relative: 12 % (ref 12–46)
Lymphs Abs: 1.3 10*3/uL (ref 0.7–4.0)
Lymphs Abs: 1.2 10*3/uL (ref 0.7–4.0)
MCH: 32.2 pg (ref 26.0–34.0)
MCH: 32.4 pg (ref 26.0–34.0)
MCHC: 31.3 g/dL (ref 30.0–36.0)
MCHC: 32 g/dL (ref 30.0–36.0)
MCV: 103.1 fL — ABNORMAL HIGH (ref 78.0–100.0)
MCV: 101.5 fL — ABNORMAL HIGH (ref 78.0–100.0)
MONO ABS: 0.6 10*3/uL (ref 0.1–1.0)
MONO ABS: 0.6 10*3/uL (ref 0.1–1.0)
Monocytes Relative: 6 % (ref 3–12)
Monocytes Relative: 5 % (ref 3–12)
NEUTROS ABS: 10.5 10*3/uL — AB (ref 1.7–7.7)
NEUTROS PCT: 80 % — AB (ref 43–77)
Neutro Abs: 8.9 10*3/uL — ABNORMAL HIGH (ref 1.7–7.7)
Neutrophils Relative %: 85 % — ABNORMAL HIGH (ref 43–77)
Platelets: 401 10*3/uL — ABNORMAL HIGH (ref 150–400)
Platelets: 332 10*3/uL (ref 150–400)
RBC: 2.95 MIL/uL — ABNORMAL LOW (ref 4.22–5.81)
RBC: 2.62 MIL/uL — ABNORMAL LOW (ref 4.22–5.81)
RDW: 17 % — ABNORMAL HIGH (ref 11.5–15.5)
RDW: 16.9 % — ABNORMAL HIGH (ref 11.5–15.5)
WBC: 11.1 10*3/uL — ABNORMAL HIGH (ref 4.0–10.5)
WBC: 12.5 10*3/uL — ABNORMAL HIGH (ref 4.0–10.5)

## 2015-07-26 LAB — BASIC METABOLIC PANEL
Anion gap: 5 (ref 5–15)
BUN: 18 mg/dL (ref 6–20)
CO2: 24 mmol/L (ref 22–32)
CREATININE: 1.28 mg/dL — AB (ref 0.61–1.24)
Calcium: 7.9 mg/dL — ABNORMAL LOW (ref 8.9–10.3)
Chloride: 111 mmol/L (ref 101–111)
GFR, EST AFRICAN AMERICAN: 58 mL/min — AB (ref >60)
GFR, EST NON AFRICAN AMERICAN: 50 mL/min — AB (ref >60)
Glucose, Bld: 124 mg/dL — ABNORMAL HIGH (ref 65–99)
Potassium: 3.6 mmol/L (ref 3.5–5.1)
Sodium: 140 mmol/L (ref 135–145)

## 2015-07-26 LAB — PROCALCITONIN: Procalcitonin: 0.12 ng/mL

## 2015-07-26 LAB — PROTIME-INR
INR: 1.17 (ref 0.00–1.49)
Prothrombin Time: 15.1 seconds (ref 11.6–15.2)

## 2015-07-26 LAB — LIPASE, BLOOD: LIPASE: 38 U/L (ref 22–51)

## 2015-07-26 LAB — I-STAT CG4 LACTIC ACID, ED
Lactic Acid, Venous: 1.38 mmol/L (ref 0.5–2.0)
Lactic Acid, Venous: 1.56 mmol/L (ref 0.5–2.0)

## 2015-07-26 LAB — APTT: aPTT: 27 seconds (ref 24–37)

## 2015-07-26 MED ORDER — MORPHINE SULFATE 2 MG/ML IJ SOLN: 2.0000 mg | INTRAMUSCULAR | Status: DC | PRN

## 2015-07-26 MED ORDER — POLYETHYLENE GLYCOL 3350 17 G PO PACK: 17.0000 g | PACK | Freq: Every day | ORAL | Status: DC | PRN

## 2015-07-26 MED ORDER — SODIUM CHLORIDE 0.9 % IV SOLN: 250.0000 mL | INTRAVENOUS | Status: DC | PRN

## 2015-07-26 MED ORDER — AMLODIPINE BESYLATE 5 MG PO TABS
5.0000 mg | ORAL_TABLET | Freq: Every day | ORAL | Status: DC
Start: 2015-07-27 — End: 2015-07-29
  Administered 2015-07-27 – 2015-07-29 (×3): 5 mg via ORAL
  Filled 2015-07-26 (×3): qty 1

## 2015-07-26 MED ORDER — IOHEXOL 300 MG/ML  SOLN
50.0000 mL | Freq: Once | INTRAMUSCULAR | Status: AC | PRN
  Administered 2015-07-26: 50 mL via INTRAVENOUS

## 2015-07-26 MED ORDER — SODIUM CHLORIDE 0.9 % IJ SOLN: 3.0000 mL | INTRAMUSCULAR | Status: DC | PRN

## 2015-07-26 MED ORDER — SACCHAROMYCES BOULARDII 250 MG PO CAPS
250.0000 mg | ORAL_CAPSULE | Freq: Two times a day (BID) | ORAL | Status: DC
  Administered 2015-07-26 – 2015-07-29 (×6): 250 mg via ORAL
  Filled 2015-07-26 (×7): qty 1

## 2015-07-26 MED ORDER — PIPERACILLIN-TAZOBACTAM 3.375 G IVPB 30 MIN: 3.3750 g | Freq: Once | INTRAVENOUS | Status: DC

## 2015-07-26 MED ORDER — IOHEXOL 300 MG/ML  SOLN
100.0000 mL | Freq: Once | INTRAMUSCULAR | Status: AC | PRN
  Administered 2015-07-26: 100 mL via INTRAVENOUS

## 2015-07-26 MED ORDER — METOPROLOL TARTRATE 25 MG PO TABS
25.0000 mg | ORAL_TABLET | Freq: Two times a day (BID) | ORAL | Status: DC
  Administered 2015-07-26 – 2015-07-29 (×6): 25 mg via ORAL
  Filled 2015-07-26 (×7): qty 1

## 2015-07-26 MED ORDER — SODIUM CHLORIDE 0.9 % IJ SOLN
3.0000 mL | Freq: Two times a day (BID) | INTRAMUSCULAR | Status: DC
  Administered 2015-07-26 – 2015-07-27 (×2): 3 mL via INTRAVENOUS

## 2015-07-26 MED ORDER — IMATINIB MESYLATE 400 MG PO TABS: 400.0000 mg | ORAL_TABLET | Freq: Every day | ORAL | Status: DC

## 2015-07-26 MED ORDER — ONDANSETRON HCL 4 MG PO TABS: 4.0000 mg | ORAL_TABLET | Freq: Four times a day (QID) | ORAL | Status: DC | PRN

## 2015-07-26 MED ORDER — SODIUM CHLORIDE 0.9 % IV BOLUS (SEPSIS): 250.0000 mL | Freq: Once | INTRAVENOUS | Status: DC

## 2015-07-26 MED ORDER — CIPROFLOXACIN IN D5W 400 MG/200ML IV SOLN
400.0000 mg | Freq: Once | INTRAVENOUS | Status: AC
  Administered 2015-07-26: 400 mg via INTRAVENOUS
  Filled 2015-07-26: qty 200

## 2015-07-26 MED ORDER — MIRTAZAPINE 15 MG PO TABS
15.0000 mg | ORAL_TABLET | Freq: Every day | ORAL | Status: DC
  Administered 2015-07-26 – 2015-07-28 (×3): 15 mg via ORAL
  Filled 2015-07-26 (×4): qty 1

## 2015-07-26 MED ORDER — SIMVASTATIN 20 MG PO TABS
20.0000 mg | ORAL_TABLET | Freq: Every evening | ORAL | Status: DC
  Administered 2015-07-26 – 2015-07-28 (×3): 20 mg via ORAL
  Filled 2015-07-26 (×4): qty 1

## 2015-07-26 MED ORDER — SACCHAROMYCES BOULARDII 250 MG PO CAPS: 250.0000 mg | ORAL_CAPSULE | Freq: Two times a day (BID) | ORAL | Status: DC

## 2015-07-26 MED ORDER — MEGESTROL ACETATE 40 MG/ML PO SUSP
40.0000 mg | Freq: Two times a day (BID) | ORAL | Status: DC
  Administered 2015-07-26 – 2015-07-29 (×6): 40 mg via ORAL
  Filled 2015-07-26 (×7): qty 5

## 2015-07-26 MED ORDER — ONDANSETRON HCL 4 MG/2ML IJ SOLN
4.0000 mg | Freq: Once | INTRAMUSCULAR | Status: AC
  Administered 2015-07-26: 4 mg via INTRAVENOUS
  Filled 2015-07-26: qty 2

## 2015-07-26 MED ORDER — METRONIDAZOLE IN NACL 5-0.79 MG/ML-% IV SOLN
500.0000 mg | Freq: Once | INTRAVENOUS | Status: AC
  Administered 2015-07-26: 500 mg via INTRAVENOUS
  Filled 2015-07-26: qty 100

## 2015-07-26 MED ORDER — ONDANSETRON HCL 4 MG/2ML IJ SOLN: 4.0000 mg | Freq: Four times a day (QID) | INTRAMUSCULAR | Status: DC | PRN

## 2015-07-26 MED ORDER — PIPERACILLIN-TAZOBACTAM 3.375 G IVPB
3.3750 g | Freq: Three times a day (TID) | INTRAVENOUS | Status: DC
  Administered 2015-07-26 – 2015-07-29 (×8): 3.375 g via INTRAVENOUS
  Filled 2015-07-26 (×9): qty 50

## 2015-07-26 MED ORDER — SODIUM CHLORIDE 0.9 % IV BOLUS (SEPSIS)
250.0000 mL | Freq: Once | INTRAVENOUS | Status: AC
  Administered 2015-07-26: 250 mL via INTRAVENOUS

## 2015-07-26 MED ORDER — SODIUM CHLORIDE 0.9 % IV SOLN: INTRAVENOUS | Status: DC

## 2015-07-26 MED ORDER — HYDROCODONE-ACETAMINOPHEN 5-325 MG PO TABS
1.0000 | ORAL_TABLET | ORAL | Status: DC | PRN
  Administered 2015-07-28: 1 via ORAL
  Filled 2015-07-26: qty 1

## 2015-07-26 NOTE — ED Notes (Addendum)
Pt noted to have an abscess with pustulent center, hard surrounding, and redness to RLQ. Pt report having recent JP drain removal from appendicitis and area healed up and then noticed this morning an abscess.

## 2015-07-26 NOTE — ED Provider Notes (Signed)
CSN: [id number]     Arrival date & time 07/26/15  1132 History   First MD Initiated Contact with Patient 07/26/15 1216     Chief Complaint  Patient presents with  . Abscess  . CA Pt      (Consider location/radiation/quality/duration/timing/severity/associated sxs/prior Treatment) Patient is a 81 y.o. male presenting with abscess. The history is provided by the patient and a relative.  Abscess Associated symptoms: no fever, no headaches, no nausea and no vomiting    patient with known metastatic gastrointestinal cancer. Patient is followed by oncology here locally. This was complicated by what was thought perhaps to be development of appendicitis back in April and/or necrotic tumors. Which developed into abscess. Was treated medically also consultation about whether treated surgically or not. Eventually end up having CT directed drainage of the abscess by interventional radiology. Patient had CT scan 10 days ago which showed resolution of the drainage tract. Patient for a long time had a bag to allow pleural material drain into. Starting today patient noted to have redness at the area where the drainage track was and a area of bulging. Denies any fever denies any significant systemic symptoms. Does have some tenderness around the area in the right lower quadrant. No nausea no vomiting.  Past Medical History  Diagnosis Date  . Hyperlipidemia     TAKES zOCOR DAILY  . Abdominal mass 02/2013  . UTI (urinary tract infection)   . Hypertension     TAKES LOTREL,HCTZ,AND METOPROLOL DAILY  . NSTEMI (non-ST elevated myocardial infarction) 02/2013    "LIGHT: ONE MD SAID HE DID AND ONE SAID HE DIDN'T  . Pneumonia     MAR 2014  . Stromal tumor of digestive system   . CAD (coronary artery disease)   . Shortness of breath   . Acute respiratory failure with hypoxia 09/12/2014  . Hemorrhagic shock 09/13/2014  . Intraperitoneal hemorrhage 09/17/2014  . CKD (chronic kidney disease), stage III    Past  Surgical History  Procedure Laterality Date  . Cyst excision  1962    wrist  . Cardiac catheterization  03-05-13  . Laparotomy N/A 06/04/2013    Procedure: EXPLORATORY LAPAROTOMY, OPEN RESECTION OF MESENTERIC AND INTESTINAL MASS;  Surgeon: [name], MD;  Location: [place];  Service: General;  Laterality: N/A;  Small Bowel Resection  . Colonoscopy  09/08/2014    dr scooler  . Left heart catheterization with coronary angiogram N/A 03/05/2013    Procedure: LEFT HEART CATHETERIZATION WITH CORONARY ANGIOGRAM;  Surgeon: [name], MD;  Location: [facility] CATH [facility];  Service: Cardiovascular;  Laterality: N/A;   Family History  Problem Relation Age of Onset  . Skin cancer Brother    History  Substance Use Topics  . Smoking status: Never Smoker   . Smokeless tobacco: Never Used  . Alcohol Use: No    Review of Systems  Constitutional: Negative for fever.  HENT: Negative for congestion.   Eyes: Negative for redness and visual disturbance.  Respiratory: Negative for shortness of breath.   Cardiovascular: Negative for chest pain.  Gastrointestinal: Positive for abdominal pain and abdominal distention. Negative for nausea and vomiting.  Genitourinary: Negative for dysuria.  Musculoskeletal: Negative for back pain.  Skin: Positive for wound. Negative for rash.  Neurological: Negative for headaches.  Hematological: Does not bruise/bleed easily.  Psychiatric/Behavioral: Negative for confusion.      Allergies  Review of patient's allergies indicates no known allergies.  Home Medications   Prior to Admission medications  Medication Sig Start Date End Date Taking? Authorizing Provider  amLODipine (NORVASC) 5 MG tablet Take 5 mg by mouth daily. 03/17/15  Yes Historical Provider, MD  furosemide (LASIX) 20 MG tablet Take 1 tablet (20 mg total) by mouth daily. 05/20/15  Yes Hosie Poisson, MD  imatinib (GLEEVEC) 400 MG tablet Take 1 tablet (400 mg total) by mouth daily. Take with meals and  large glass of water.Caution:Chemotherapy. 02/13/15  Yes Ladell Pier, MD  megestrol (MEGACE) 40 MG/ML suspension Take 40 mg by mouth 2 (two) times daily.   Yes Historical Provider, MD  metoprolol tartrate (LOPRESSOR) 25 MG tablet Take 1 tablet (25 mg total) by mouth 2 (two) times daily. 03/06/13  Yes Costin Karlyne Greenspan, MD  mirtazapine (REMERON) 15 MG tablet Take 15 mg by mouth at bedtime.   Yes Historical Provider, MD  simvastatin (ZOCOR) 20 MG tablet Take 20 mg by mouth every evening.   Yes Historical Provider, MD  saccharomyces boulardii (FLORASTOR) 250 MG capsule Take 1 capsule (250 mg total) by mouth 2 (two) times daily. Patient not taking: Reported on 07/26/2015 06/25/15   Ladell Pier, MD   BP 123/54 mmHg  Pulse 79  Temp(Src) 98.3 F (36.8 C) (Oral)  Resp 15  SpO2 100% Physical Exam  Constitutional: He is oriented to person, place, and time. He appears well-developed and well-nourished. No distress.  HENT:  Head: Normocephalic and atraumatic.  Mouth/Throat: Oropharynx is clear and moist.  Eyes: Conjunctivae and EOM are normal. Pupils are equal, round, and reactive to light.  Neck: Normal range of motion.  Cardiovascular: Normal rate and regular rhythm.   No murmur heard. Pulmonary/Chest: Effort normal and breath sounds normal. No respiratory distress.  Abdominal: Soft. Bowel sounds are normal. He exhibits distension and mass. There is tenderness.  Patient's right lower quadrant with a 1 cm area of the lodging skin about 3 cm of erythema. A 4 cm of induration. Abdomen is distended consistent with probably ascites. No drainage from the right lower quadrant.  Musculoskeletal: Normal range of motion.  Neurological: He is alert and oriented to person, place, and time. No cranial nerve deficit. He exhibits normal muscle tone. Coordination normal.  Skin: Skin is warm. There is erythema.  Nursing note and vitals reviewed.   ED Course  Procedures (including critical care time) Labs  Review Labs Reviewed  CBC WITH DIFFERENTIAL/PLATELET - Abnormal; Notable for the following:    WBC 11.1 (*)    RBC 2.95 (*)    Hemoglobin 9.5 (*)    HCT 30.4 (*)    MCV 103.1 (*)    RDW 17.0 (*)    Platelets 401 (*)    Neutrophils Relative % 80 (*)    Neutro Abs 8.9 (*)    All other components within normal limits  BASIC METABOLIC PANEL - Abnormal; Notable for the following:    Glucose, Bld 124 (*)    Creatinine, Ser 1.28 (*)    Calcium 7.9 (*)    GFR calc non Af Amer 50 (*)    GFR calc Af Amer 58 (*)    All other components within normal limits  COMPREHENSIVE METABOLIC PANEL - Abnormal; Notable for the following:    Glucose, Bld 123 (*)    Calcium 7.9 (*)    Total Protein 6.4 (*)    Albumin 2.2 (*)    Alkaline Phosphatase 129 (*)    GFR calc non Af Amer 52 (*)    GFR calc Af Amer 60 (*)  All other components within normal limits  LIPASE, BLOOD  I-STAT CG4 LACTIC ACID, ED  I-STAT CG4 LACTIC ACID, ED   Results for orders placed or performed during the hospital encounter of 07/26/15  CBC with Differential  Result Value Ref Range   WBC 11.1 (H) 4.0 - 10.5 K/uL   RBC 2.95 (L) 4.22 - 5.81 MIL/uL   Hemoglobin 9.5 (L) 13.0 - 17.0 g/dL   HCT 30.4 (L) 39.0 - 52.0 %   MCV 103.1 (H) 78.0 - 100.0 fL   MCH 32.2 26.0 - 34.0 pg   MCHC 31.3 30.0 - 36.0 g/dL   RDW 17.0 (H) 11.5 - 15.5 %   Platelets 401 (H) 150 - 400 K/uL   Neutrophils Relative % 80 (H) 43 - 77 %   Neutro Abs 8.9 (H) 1.7 - 7.7 K/uL   Lymphocytes Relative 12 12 - 46 %   Lymphs Abs 1.3 0.7 - 4.0 K/uL   Monocytes Relative 6 3 - 12 %   Monocytes Absolute 0.6 0.1 - 1.0 K/uL   Eosinophils Relative 2 0 - 5 %   Eosinophils Absolute 0.2 0.0 - 0.7 K/uL   Basophils Relative 0 0 - 1 %   Basophils Absolute 0.0 0.0 - 0.1 K/uL  Basic metabolic panel  Result Value Ref Range   Sodium 140 135 - 145 mmol/L   Potassium 3.6 3.5 - 5.1 mmol/L   Chloride 111 101 - 111 mmol/L   CO2 24 22 - 32 mmol/L   Glucose, Bld 124 (H) 65 - 99  mg/dL   BUN 18 6 - 20 mg/dL   Creatinine, Ser 1.28 (H) 0.61 - 1.24 mg/dL   Calcium 7.9 (L) 8.9 - 10.3 mg/dL   GFR calc non Af Amer 50 (L) >60 mL/min   GFR calc Af Amer 58 (L) >60 mL/min   Anion gap 5 5 - 15  Comprehensive metabolic panel  Result Value Ref Range   Sodium 140 135 - 145 mmol/L   Potassium 3.6 3.5 - 5.1 mmol/L   Chloride 111 101 - 111 mmol/L   CO2 22 22 - 32 mmol/L   Glucose, Bld 123 (H) 65 - 99 mg/dL   BUN 18 6 - 20 mg/dL   Creatinine, Ser 1.24 0.61 - 1.24 mg/dL   Calcium 7.9 (L) 8.9 - 10.3 mg/dL   Total Protein 6.4 (L) 6.5 - 8.1 g/dL   Albumin 2.2 (L) 3.5 - 5.0 g/dL   AST 34 15 - 41 U/L   ALT 37 17 - 63 U/L   Alkaline Phosphatase 129 (H) 38 - 126 U/L   Total Bilirubin 0.3 0.3 - 1.2 mg/dL   GFR calc non Af Amer 52 (L) >60 mL/min   GFR calc Af Amer 60 (L) >60 mL/min   Anion gap 7 5 - 15  Lipase, blood  Result Value Ref Range   Lipase 38 22 - 51 U/L  I-Stat CG4 Lactic Acid, ED  Result Value Ref Range   Lactic Acid, Venous 1.38 0.5 - 2.0 mmol/L  I-Stat CG4 Lactic Acid, ED  Result Value Ref Range   Lactic Acid, Venous 1.56 0.5 - 2.0 mmol/L     Imaging Review No results found.   EKG Interpretation None      MDM   Final diagnoses:  Abdominal pain  Fistula    Patient with a, gated history over the past 3-4 months. Patient was thought to perhaps have the appendicitis but this was complicated by gastric intestinal metastatic  cancer associated with ascites. Patient has a GIST. Patient is followed by hematology oncology here. Back in April outpatient development was thought possibly be appendicitis it was treated medically with Vicente Males bodyaches and then percutaneous drainage by interventional radiology. Patient had CT scan 10 days ago and he felt everything and resolved. Along draining sinus up until about 10 days ago. Yesterday patient started to develop redness where the previous draining sinus was in now has about a 1 cm area approaching out. Systemically  patient without any significant symptoms.  CT scan is pending and will tell a lot of information. Most likely patient will require admission and reevaluation by interventional radiology and less CAT scan shows nothing of significance then patient could be discharged home and follow-up with his doctors but most likely will require admission. Lactic acid is not elevated here no significant leukocytosis but mildly elevated white blood cell count. Has some mild renal insufficiency. Liver function test without elevation of bilirubin. On physical exam suspect he does have ascites he's had to have that drained before.      Fredia Sorrow, MD 07/26/15 1536

## 2015-07-26 NOTE — Progress Notes (Signed)
Subjective: 81 y.o. male with h/o GIST tumor, peritoneal carcinomatosis, being treated with [facility] who was admitted 05/05/15 with a chief complaint of abdominal pain associated with nausea. A CT scan, done on admission, showed possible findings consistent with appendicitis versus progressive abdominal carcinomatosis. The patient was placed on empiric Zosyn. He had significant abdominal distention and on 05/10/15 developed diarrhea with subsequent C. difficile testing positive. Flagyl was started at that time.he appeared to have possible appendicitis and this was treated with drain and abx.drain has been out a month now.  Has persistent drainage.  Undergoing paracentesis still.  Appetite better.  Was on c/f for continued drainage without abscess from IR.  Today he had more pain, swelling at site, redness and then feels better since it burst.  Scan shows small abscess.    Objective: Vital signs in last 24 hours: Temp:  [98.3 F (36.8 C)-98.9 F (37.2 C)] 98.9 F (37.2 C) (08/07 1734) Pulse Rate:  [79-84] 84 (08/07 1734) Resp:  [15-22] 18 (08/07 1734) BP: (123-147)/(46-54) 130/46 mmHg (08/07 1734) SpO2:  [96 %-100 %] 100 % (08/07 1734) Weight:  [72.576 kg (160 lb)] 72.576 kg (160 lb) (08/07 1900) Last BM Date: 07/26/15 (per pt)  Intake/Output from previous day:   Intake/Output this shift:    GI: soft tender around rlq drain site with erythema now, some serous drainage on bandage  Lab Results:   Recent Labs  07/26/15 1222 07/26/15 1731  WBC 11.1* 12.5*  HGB 9.5* 8.5*  HCT 30.4* 26.6*  PLT 401* 332   BMET  Recent Labs  07/26/15 1222 07/26/15 1731  NA 140 137  K 3.6 3.7  CL 111 113*  CO2 24 22  GLUCOSE 124* 96  BUN 18 15  CREATININE 1.28* 1.18  CALCIUM 7.9* 7.2*   PT/INR  Recent Labs  07/26/15 1731  LABPROT 15.1  INR 1.17   ABG No results for input(s): PHART, HCO3 in the last 72 hours.  Invalid input(s): PCO2, PO2  Studies/Results: Ct Abdomen Pelvis W  Contrast  07/26/2015   CLINICAL DATA:  RIGHT lower quadrant abscess with pus, induration and erythema in RIGHT lower quadrant, recent JP drain removal for appendicitis ; history hypertension, coronary artery disease, stage 3 chronic kidney disease, diastolic CHF, GI stromal tumor with abdominal carcinomatosis  EXAM: CT ABDOMEN AND PELVIS WITH CONTRAST  TECHNIQUE: Multidetector CT imaging of the abdomen and pelvis was performed using the standard protocol following bolus administration of intravenous contrast. Sagittal and coronal MPR images reconstructed from axial data set.  CONTRAST:  53mL OMNIPAQUE IOHEXOL 300 MG/ML SOLN, 122mL OMNIPAQUE IOHEXOL 300 MG/ML SOLN  COMPARISON:  07/16/2015, 06/08/2015, 05/13/2015  FINDINGS: LEFT pleural effusion and lower lobe atelectasis.  15 mm gallstone in gallbladder.  Liver, spleen, pancreas, kidneys, and adrenal glands normal.  Significant ascites throughout abdomen and pelvis.  RIGHT inguinal hernia containing a small bowel loop without definite evidence of bowel obstruction.  Significant bowel wall thickening of a distal ileal loop in RIGHT pelvis.  Area of diffuse infiltration, fluid, enlargement and poor definition of the lateral RIGHT abdominal wall anterior to the anterior RIGHT iliac bone, could represent a developing abscess though cannot exclude tumor implant along the prior drain tract.  Additional multiloculated collection in the upper RIGHT pelvis at site of large abscess identified on 05/13/2015, now measuring 3.3 x 2.5 cm image 61.  Distal descending and sigmoid diverticulosis.  Unremarkable bladder and ureters.  Small LEFT inguinal hernia containing fat.  No mass, adenopathy, or free  air.  Omental implant in the mid abdomen 25 x 13 mm image 32, with scattered additional areas of subtle peritoneal carcinomatosis in the anterior abdomen and mesentery.  Persistent enlargement inflammatory changes of the appendix compatible with appendicitis.  No acute osseous  findings.  IMPRESSION: Significant ascites with multiple areas of peritoneal based tumor implants compatible with peritoneal carcinomatosis.  Increased size of an area of infiltrative change at the lateral RIGHT mid abdominal wall anterior to the RIGHT iliac bone, could represent developing inflammatory process/abscess or tumor implantation along prior drainage catheter tract.  Multiloculated collection in upper RIGHT pelvis 3.3 x 2.5 cm at site of previous abscess, question recurrent abscess.  No definite evidence of bowel obstruction.  Persistent enlargement and inflammation of the appendix as well as edema of a small bowel loop in the RIGHT pelvis.  Decreased LEFT pleural effusion and basilar atelectasis since prior study.   Electronically Signed   By: Lavonia Dana M.D.   On: 07/26/2015 15:55    Anti-infectives: Anti-infectives    Start     Dose/Rate Route Frequency Ordered Stop   07/26/15 1830  piperacillin-tazobactam (ZOSYN) IVPB 3.375 g     3.375 g 12.5 mL/hr over 240 Minutes Intravenous 3 times per day 07/26/15 1748     07/26/15 1745  piperacillin-tazobactam (ZOSYN) IVPB 3.375 g  Status:  Discontinued     3.375 g 100 mL/hr over 30 Minutes Intravenous  Once 07/26/15 1730 07/26/15 1748   07/26/15 1615  ciprofloxacin (CIPRO) IVPB 400 mg     400 mg 200 mL/hr over 60 Minutes Intravenous  Once 07/26/15 1602 07/26/15 1739   07/26/15 1615  metroNIDAZOLE (FLAGYL) IVPB 500 mg     500 mg 100 mL/hr over 60 Minutes Intravenous  Once 07/26/15 1602 07/26/15 1738      Assessment/Plan: IA abscess  This is likely from appendix. Agree with admission, iv abx, npo for possible drain placement and evaluation by IR tomorrow. I dont think surgery indicated for this and that would be last option.  Will follow with you  [facility] 07/26/2015

## 2015-07-26 NOTE — ED Provider Notes (Signed)
Please see previous physicians note regarding patient's presenting history and physical, initial ED course, and associated MDM. In short this is a 81 year old male with history of metastatic gastrointestinal cancer complicated by recurrent intra-abdominal abscesses secondary to appendicitis who presents with abdominal pain and redness involving his previous drainage tract. At times he is pending CT abdomen and pelvis. A CT is visualized and are reviewed with radiology. He has  recurrent intra-abdominal abscess in the right lower quadrant, requiring drainage. Is initiated on ciprofloxacin and Flagyl. On my examination he has a distended abdomen due to ascites, but has a soft nonsurgical abdomen. There is an area of induration and redness in the right lower quadrant where previous drainage site was. He has been hemodynamically stable here without evidence of systemic illness. Discussed with the hospitalist who will admit him to [place] for ongoing management in IR consult. Per hospitalist requests our Gen. surgery was notified in case patient's status declines.   Forde Dandy, MD 07/26/15 [phone]

## 2015-07-26 NOTE — ED Notes (Signed)
Spoke with [place] in lab. States they can add on the CMET & Lipase.

## 2015-07-26 NOTE — H&P (Addendum)
[facility] History and Physical  [name] URK:[id number] DOB: Apr 20, 1929 DOA: 07/26/2015  Referring physician: Dr. Augustin Coupe PCP: Abigail Miyamoto, MD   Chief Complaint: abd pain  HPI: [name] [name] is a 81 y.o. 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 He relates the opening has closed but has continued draining, he relates that the draining increasing and its slightly mild malodorous he started having abdominal pain on the day prior to admission. With erythema. He denies any fever, chills, nausea, diarrhea. The drainage has increased so he came into the hospital.  In the ER,: He has remained afebrile blood pressure stable, CBC was done that shows mild leukocytosis, sore consulted for further evaluation.   Review of Systems:  Constitutional:  No weight loss, night sweats, Fevers, chills, fatigue.  HEENT:  No headaches, Difficulty swallowing,Tooth/dental problems,Sore throat,  No sneezing, itching, ear ache, nasal congestion, post nasal drip,  Cardio-vascular:  No chest pain, Orthopnea, PND, swelling in lower extremities, anasarca, dizziness, palpitations  GI:  No heartburn, indigestion, abdominal pain, nausea, vomiting, diarrhea, change in bowel habits, loss of appetite  Resp:  No shortness of breath with exertion or at rest. No excess mucus, no productive cough, No non-productive cough, No coughing up of blood.No change in color of mucus.No wheezing.No chest wall deformity  Skin:  no rash or lesions.  GU:  no dysuria, change in color of urine, no urgency or frequency. No flank pain.  Musculoskeletal:  No joint pain or swelling. No decreased range of motion. No back pain.  Psych:  No change in mood or affect. No depression or anxiety. No memory loss.   Past Medical History  Diagnosis Date  . Hyperlipidemia     TAKES  zOCOR DAILY  . Abdominal mass 02/2013  . UTI (urinary tract infection)   . Hypertension     TAKES LOTREL,HCTZ,AND METOPROLOL DAILY  . NSTEMI (non-ST elevated myocardial infarction) 02/2013    "LIGHT: ONE MD SAID HE DID AND ONE SAID HE DIDN'T  . Pneumonia     MAR 2014  . Stromal tumor of digestive system   . CAD (coronary artery disease)   . Shortness of breath   . Acute respiratory failure with hypoxia 09/12/2014  . Hemorrhagic shock 09/13/2014  . Intraperitoneal hemorrhage 09/17/2014  . CKD (chronic kidney disease), stage III    Past Surgical History  Procedure Laterality Date  . Cyst excision  1962    wrist  . Cardiac catheterization  03-05-13  . Laparotomy N/A 06/04/2013    Procedure: EXPLORATORY LAPAROTOMY, OPEN RESECTION OF MESENTERIC AND INTESTINAL MASS;  Surgeon: [name], MD;  Location: [facility];  Service: General;  Laterality: N/A;  Small Bowel Resection  . Colonoscopy  09/08/2014    dr scooler  . Left heart catheterization with coronary angiogram N/A 03/05/2013    Procedure: LEFT HEART CATHETERIZATION WITH CORONARY ANGIOGRAM;  Surgeon: [name], MD;  Location: [facility] CATH LAB;  Service: Cardiovascular;  Laterality: N/A;   Social History:  reports that he has never smoked. He has never used smokeless tobacco. He reports that he does not drink alcohol or use illicit drugs.  No Known Allergies  Family History  Problem Relation Age of Onset  . Skin cancer Brother   . Stroke Mother   . Skin cancer Father     Prior to Admission medications  Medication Sig Start Date End Date Taking? Authorizing Provider  amLODipine (NORVASC) 5 MG tablet Take 5 mg by mouth daily. 03/17/15  Yes Historical Provider, MD  furosemide (LASIX) 20 MG tablet Take 1 tablet (20 mg total) by mouth daily. 05/20/15  Yes Hosie Poisson, MD  imatinib (GLEEVEC) 400 MG tablet Take 1 tablet (400 mg total) by mouth daily. Take with meals and large glass of water.Caution:Chemotherapy. 02/13/15  Yes Ladell Pier, MD  megestrol (MEGACE) 40 MG/ML suspension Take 40 mg by mouth 2 (two) times daily.   Yes Historical Provider, MD  metoprolol tartrate (LOPRESSOR) 25 MG tablet Take 1 tablet (25 mg total) by mouth 2 (two) times daily. 03/06/13  Yes Costin Karlyne Greenspan, MD  mirtazapine (REMERON) 15 MG tablet Take 15 mg by mouth at bedtime.   Yes Historical Provider, MD  simvastatin (ZOCOR) 20 MG tablet Take 20 mg by mouth every evening.   Yes Historical Provider, MD  saccharomyces boulardii (FLORASTOR) 250 MG capsule Take 1 capsule (250 mg total) by mouth 2 (two) times daily. Patient not taking: Reported on 07/26/2015 06/25/15   Ladell Pier, MD   Physical Exam: Filed Vitals:   07/26/15 1143 07/26/15 1154 07/26/15 1331 07/26/15 1545  BP: 147/51  123/54 139/46  Pulse: 80  79 79  Temp: 98.3 F (36.8 C)     TempSrc: Oral     Resp: 18  15 22   SpO2: 100% 96% 100% 100%    Wt Readings from Last 3 Encounters:  07/16/15 71.668 kg (158 lb)  07/08/15 72.576 kg (160 lb)  06/25/15 70.081 kg (154 lb 8 oz)    General:  Appears calm and comfortable Eyes: PERRL, normal lids, irises & conjunctiva ENT: grossly normal hearing, lips & tongue Neck: no LAD, masses or thyromegaly Cardiovascular: RRR, no m/r/g. Lower extremity edema with edema in his sacrum. Telemetry: SR, no arrhythmias  Respiratory: CTA bilaterally, no w/r/r. Normal respiratory effort. Abdomen: Abdomen is soft significantly distended, he also has about a 6 cm erythema with drainage and tender to touch. No rebound or guarding. The area around the scar is tender to palpation. Skin: no rash or induration seen on limited exam Musculoskeletal: grossly normal tone BUE/BLE Psychiatric: grossly normal mood and affect, speech fluent and appropriate Neurologic: grossly non-focal.          Labs on Admission:  Basic Metabolic Panel:  Recent Labs Lab 07/26/15 1220 07/26/15 1222  NA 140 140  K 3.6 3.6  CL 111 111  CO2 22 24  GLUCOSE 123* 124*    BUN 18 18  CREATININE 1.24 1.28*  CALCIUM 7.9* 7.9*   Liver Function Tests:  Recent Labs Lab 07/26/15 1220  AST 34  ALT 37  ALKPHOS 129*  BILITOT 0.3  PROT 6.4*  ALBUMIN 2.2*    Recent Labs Lab 07/26/15 1220  LIPASE 38   No results for input(s): AMMONIA in the last 168 hours. CBC:  Recent Labs Lab 07/26/15 1222  WBC 11.1*  NEUTROABS 8.9*  HGB 9.5*  HCT 30.4*  MCV 103.1*  PLT 401*   Cardiac Enzymes: No results for input(s): CKTOTAL, CKMB, CKMBINDEX, TROPONINI in the last 168 hours.  BNP (last 3 results) No results for input(s): BNP in the last 8760 hours.  ProBNP (last 3 results)  Recent Labs  09/12/14 0445  PROBNP 5726.0*    CBG: No results for input(s): GLUCAP in the last 168 hours.  Radiological Exams on Admission: Ct Abdomen Pelvis W Contrast  07/26/2015   CLINICAL DATA:  RIGHT lower quadrant abscess with pus, induration and erythema in RIGHT lower quadrant, recent JP drain removal for appendicitis ; history hypertension, coronary artery disease, stage 3 chronic kidney disease, diastolic CHF, GI stromal tumor with abdominal carcinomatosis  EXAM: CT ABDOMEN AND PELVIS WITH CONTRAST  TECHNIQUE: Multidetector CT imaging of the abdomen and pelvis was performed using the standard protocol following bolus administration of intravenous contrast. Sagittal and coronal MPR images reconstructed from axial data set.  CONTRAST:  75mL OMNIPAQUE IOHEXOL 300 MG/ML SOLN, 128mL OMNIPAQUE IOHEXOL 300 MG/ML SOLN  COMPARISON:  07/16/2015, 06/08/2015, 05/13/2015  FINDINGS: LEFT pleural effusion and lower lobe atelectasis.  15 mm gallstone in gallbladder.  Liver, spleen, pancreas, kidneys, and adrenal glands normal.  Significant ascites throughout abdomen and pelvis.  RIGHT inguinal hernia containing a small bowel loop without definite evidence of bowel obstruction.  Significant bowel wall thickening of a distal ileal loop in RIGHT pelvis.  Area of diffuse infiltration, fluid,  enlargement and poor definition of the lateral RIGHT abdominal wall anterior to the anterior RIGHT iliac bone, could represent a developing abscess though cannot exclude tumor implant along the prior drain tract.  Additional multiloculated collection in the upper RIGHT pelvis at site of large abscess identified on 05/13/2015, now measuring 3.3 x 2.5 cm image 61.  Distal descending and sigmoid diverticulosis.  Unremarkable bladder and ureters.  Small LEFT inguinal hernia containing fat.  No mass, adenopathy, or free air.  Omental implant in the mid abdomen 25 x 13 mm image 32, with scattered additional areas of subtle peritoneal carcinomatosis in the anterior abdomen and mesentery.  Persistent enlargement inflammatory changes of the appendix compatible with appendicitis.  No acute osseous findings.  IMPRESSION: Significant ascites with multiple areas of peritoneal based tumor implants compatible with peritoneal carcinomatosis.  Increased size of an area of infiltrative change at the lateral RIGHT mid abdominal wall anterior to the RIGHT iliac bone, could represent developing inflammatory process/abscess or tumor implantation along prior drainage catheter tract.  Multiloculated collection in upper RIGHT pelvis 3.3 x 2.5 cm at site of previous abscess, question recurrent abscess.  No definite evidence of bowel obstruction.  Persistent enlargement and inflammation of the appendix as well as edema of a small bowel loop in the RIGHT pelvis.  Decreased LEFT pleural effusion and basilar atelectasis since prior study.   Electronically Signed   By: Lavonia Dana M.D.   On: 07/26/2015 15:55    EKG: Independently reviewed. none  Assessment/Plan Abdominal pain/ Abdominal abscess/ Appendiceal abscess/Sepsis: - I have asked emergency room department to consider surgery for assistance. - I agree with L as he responded to this last time. We'll get interventional radiology to place drain. - We'll start him on for store as he  has a history of C. difficile colitis in the last month. - He seems to be fluid overloaded, his blood pressure stable, so we'll monitor his blood pressure and will hold on IV fluids.  Chronic anemia likely multifactorial: Hemoglobin is stable continue to monitor will transfuse if hemoglobin less than 7.  Chronic NEC stage III: Creatinine at baseline. Will hold on diuretics at this point.  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 Blair Promise normal.  Essential hypertension: Continue metoprolol hold Norvasc. Code Status: full DVT Prophylaxis:scd Family Communication: son Disposition Plan: inpatient  Time spent: 16 min  Charlynne Cousins [facility] Pager [phone]

## 2015-07-26 NOTE — Progress Notes (Signed)
ANTIBIOTIC CONSULT NOTE - INITIAL  Pharmacy Consult for zosyn Indication: Intra-abdominal  No Known Allergies  Patient Measurements:   Adjusted Body Weight:   Vital Signs: Temp: 98.9 F (37.2 C) (08/07 1734) Temp Source: Oral (08/07 1734) BP: 130/46 mmHg (08/07 1734) Pulse Rate: 84 (08/07 1734) Intake/Output from previous day:   Intake/Output from this shift:    Labs:  Recent Labs  07/26/15 1220 07/26/15 1222  WBC  --  11.1*  HGB  --  9.5*  PLT  --  401*  CREATININE 1.24 1.28*   Estimated Creatinine Clearance: 40.2 mL/min (by C-G formula based on Cr of 1.28). No results for input(s): VANCOTROUGH, VANCOPEAK, VANCORANDOM, GENTTROUGH, GENTPEAK, GENTRANDOM, TOBRATROUGH, TOBRAPEAK, TOBRARND, AMIKACINPEAK, AMIKACINTROU, AMIKACIN in the last 72 hours.   Microbiology: No results found for this or any previous visit (from the past 720 hour(s)).  Medical History: Past Medical History  Diagnosis Date  . Hyperlipidemia     TAKES zOCOR DAILY  . Abdominal mass 02/2013  . UTI (urinary tract infection)   . Hypertension     TAKES LOTREL,HCTZ,AND METOPROLOL DAILY  . NSTEMI (non-ST elevated myocardial infarction) 02/2013    "LIGHT: ONE MD SAID HE DID AND ONE SAID HE DIDN'T  . Pneumonia     MAR 2014  . Stromal tumor of digestive system   . CAD (coronary artery disease)   . Shortness of breath   . Acute respiratory failure with hypoxia 09/12/2014  . Hemorrhagic shock 09/13/2014  . Intraperitoneal hemorrhage 09/17/2014  . CKD (chronic kidney disease), stage III     Assessment: 41 YOM with h/o peritoneal carcinomatosis with recent drain placement for fluid collection and was on antibiotics until two weeks ago per H&P (Discharge summary in June only has him discharged with metronidazole for C. Diff).  He is experiencing drainage from drain site despite it being removed and the opening closed.   8/7 >> zosyn  >>  8/7 blood:  Dose changes/levels:   Goal of Therapy:  Dose for  indication and for patient-specific parameters  Plan:   Start zosyn 3.375gm IV q8h infused over 4hr  No additional dose adjustment at this time  Await cultures  Doreene Eland, PharmD, BCPS.   Pager: [phone] 07/26/2015,5:49 PM

## 2015-07-26 NOTE — ED Notes (Signed)
Patient transported to CT 

## 2015-07-27 ENCOUNTER — Inpatient Hospital Stay (HOSPITAL_COMMUNITY): Payer: Medicare Other

## 2015-07-27 DIAGNOSIS — Z862 Personal history of diseases of the blood and blood-forming organs and certain disorders involving the immune mechanism: Secondary | ICD-10-CM

## 2015-07-27 DIAGNOSIS — C494 Malignant neoplasm of connective and soft tissue of abdomen: Secondary | ICD-10-CM

## 2015-07-27 DIAGNOSIS — R188 Other ascites: Secondary | ICD-10-CM | POA: Insufficient documentation

## 2015-07-27 DIAGNOSIS — C786 Secondary malignant neoplasm of retroperitoneum and peritoneum: Secondary | ICD-10-CM

## 2015-07-27 DIAGNOSIS — A047 Enterocolitis due to Clostridium difficile: Secondary | ICD-10-CM

## 2015-07-27 DIAGNOSIS — R1031 Right lower quadrant pain: Secondary | ICD-10-CM

## 2015-07-27 LAB — BASIC METABOLIC PANEL
Anion gap: 3 — ABNORMAL LOW (ref 5–15)
BUN: 17 mg/dL (ref 6–20)
CO2: 23 mmol/L (ref 22–32)
Calcium: 7.7 mg/dL — ABNORMAL LOW (ref 8.9–10.3)
Chloride: 112 mmol/L — ABNORMAL HIGH (ref 101–111)
Creatinine, Ser: 1.26 mg/dL — ABNORMAL HIGH (ref 0.61–1.24)
GFR calc Af Amer: 59 mL/min — ABNORMAL LOW (ref >60)
GFR, EST NON AFRICAN AMERICAN: 51 mL/min — AB (ref >60)
Glucose, Bld: 96 mg/dL (ref 65–99)
Potassium: 4 mmol/L (ref 3.5–5.1)
Sodium: 138 mmol/L (ref 135–145)

## 2015-07-27 LAB — CBC
HCT: 25.4 % — ABNORMAL LOW (ref 39.0–52.0)
Hemoglobin: 8 g/dL — ABNORMAL LOW (ref 13.0–17.0)
MCH: 32.3 pg (ref 26.0–34.0)
MCHC: 31.5 g/dL (ref 30.0–36.0)
MCV: 102.4 fL — AB (ref 78.0–100.0)
Platelets: 303 10*3/uL (ref 150–400)
RBC: 2.48 MIL/uL — AB (ref 4.22–5.81)
RDW: 16.9 % — ABNORMAL HIGH (ref 11.5–15.5)
WBC: 8.1 10*3/uL (ref 4.0–10.5)

## 2015-07-27 MED ORDER — MIDAZOLAM HCL 2 MG/2ML IJ SOLN
INTRAMUSCULAR | Status: AC | PRN
  Administered 2015-07-27 (×2): 0.5 mg via INTRAVENOUS

## 2015-07-27 MED ORDER — IOHEXOL 300 MG/ML  SOLN
10.0000 mL | Freq: Once | INTRAMUSCULAR | Status: AC | PRN
  Administered 2015-07-27: 10 mL

## 2015-07-27 MED ORDER — IMATINIB MESYLATE 100 MG PO TABS
400.0000 mg | ORAL_TABLET | Freq: Every day | ORAL | Status: DC
  Administered 2015-07-28 – 2015-07-29 (×2): 400 mg via ORAL
  Filled 2015-07-27 (×3): qty 4

## 2015-07-27 MED ORDER — SODIUM CHLORIDE 0.9 % IV SOLN: 250.0000 mL | INTRAVENOUS | Status: DC | PRN

## 2015-07-27 MED ORDER — FENTANYL CITRATE (PF) 100 MCG/2ML IJ SOLN
INTRAMUSCULAR | Status: AC | PRN
  Administered 2015-07-27: 25 ug via INTRAVENOUS

## 2015-07-27 MED ORDER — SODIUM CHLORIDE 0.45 % IV SOLN
INTRAVENOUS | Status: DC
  Administered 2015-07-27: 14:00:00 via INTRAVENOUS

## 2015-07-27 MED ORDER — FENTANYL CITRATE (PF) 100 MCG/2ML IJ SOLN
INTRAMUSCULAR | Status: AC
  Filled 2015-07-27: qty 2

## 2015-07-27 MED ORDER — LIDOCAINE-EPINEPHRINE 2 %-1:100000 IJ SOLN
INTRAMUSCULAR | Status: AC
  Filled 2015-07-27: qty 1

## 2015-07-27 MED ORDER — MIDAZOLAM HCL 2 MG/2ML IJ SOLN
INTRAMUSCULAR | Status: AC
  Filled 2015-07-27: qty 4

## 2015-07-27 NOTE — Procedures (Signed)
Technically successful fluoroscopic guided placement of a 10 Fr drainage catheter placement into the right lower quadrant via the residual patent track of the prior percutaneous drain.  Contrast injection demonstrates persistent communication btwn the residual track and the appendix/cecum. Successful US guided paracentesis yielding 2 L of serous fluid. No immediate post procedural complications.   Ronny Bacon, MD Pager #: [phone]

## 2015-07-27 NOTE — Progress Notes (Signed)
IP PROGRESS NOTE  Subjective:   [name] [name] is known to me with a history of recurrent gastrointestinal stromal tumor. He is maintained on Gleevec. There is been no clear evidence of disease progression since he started Gleevec  He reports feeling well until he developed recurrent right abdomen pain over the weekend. There is been erythema and drainage at the previous abdominal catheter site.  Objective: Vital signs in last 24 hours: Blood pressure 135/61, pulse 76, temperature 98.5 F (36.9 C), temperature source Oral, resp. rate 23, height 5' 7" (1.702 m), weight 160 lb (72.576 kg), SpO2 100 %.  Intake/Output from previous day: 08/07 0701 - 08/08 0700 In: 240 [P.O.:240] Out: -   Physical Exam:  Lungs: Lungs clear bilaterally with decreased breath sounds at the bases, no respiratory distress Cardiac: Regular rate and rhythm Abdomen: Distended, no mass, induration with serous drainage at the right low abdomen drain site Extremities: 1+ pitting edema at the low leg bilaterally     Lab Results:  Recent Labs  07/26/15 1731 07/27/15 0514  WBC 12.5* 8.1  HGB 8.5* 8.0*  HCT 26.6* 25.4*  PLT 332 303    BMET  Recent Labs  07/26/15 1731 07/27/15 0514  NA 137 138  K 3.7 4.0  CL 113* 112*  CO2 22 23  GLUCOSE 96 96  BUN 15 17  CREATININE 1.18 1.26*  CALCIUM 7.2* 7.7*    Studies/Results: Ct Abdomen Pelvis W Contrast  07/26/2015   CLINICAL DATA:  RIGHT lower quadrant abscess with pus, induration and erythema in RIGHT lower quadrant, recent JP drain removal for appendicitis ; history hypertension, coronary artery disease, stage 3 chronic kidney disease, diastolic CHF, GI stromal tumor with abdominal carcinomatosis  EXAM: CT ABDOMEN AND PELVIS WITH CONTRAST  TECHNIQUE: Multidetector CT imaging of the abdomen and pelvis was performed using the standard protocol following bolus administration of intravenous contrast. Sagittal and coronal MPR images reconstructed from axial data  set.  CONTRAST:  50mL OMNIPAQUE IOHEXOL 300 MG/ML SOLN, 100mL OMNIPAQUE IOHEXOL 300 MG/ML SOLN  COMPARISON:  07/16/2015, 06/08/2015, 05/13/2015  FINDINGS: LEFT pleural effusion and lower lobe atelectasis.  15 mm gallstone in gallbladder.  Liver, spleen, pancreas, kidneys, and adrenal glands normal.  Significant ascites throughout abdomen and pelvis.  RIGHT inguinal hernia containing a small bowel loop without definite evidence of bowel obstruction.  Significant bowel wall thickening of a distal ileal loop in RIGHT pelvis.  Area of diffuse infiltration, fluid, enlargement and poor definition of the lateral RIGHT abdominal wall anterior to the anterior RIGHT iliac bone, could represent a developing abscess though cannot exclude tumor implant along the prior drain tract.  Additional multiloculated collection in the upper RIGHT pelvis at site of large abscess identified on 05/13/2015, now measuring 3.3 x 2.5 cm image 61.  Distal descending and sigmoid diverticulosis.  Unremarkable bladder and ureters.  Small LEFT inguinal hernia containing fat.  No mass, adenopathy, or free air.  Omental implant in the mid abdomen 25 x 13 mm image 32, with scattered additional areas of subtle peritoneal carcinomatosis in the anterior abdomen and mesentery.  Persistent enlargement inflammatory changes of the appendix compatible with appendicitis.  No acute osseous findings.  IMPRESSION: Significant ascites with multiple areas of peritoneal based tumor implants compatible with peritoneal carcinomatosis.  Increased size of an area of infiltrative change at the lateral RIGHT mid abdominal wall anterior to the RIGHT iliac bone, could represent developing inflammatory process/abscess or tumor implantation along prior drainage catheter tract.  Multiloculated collection   in upper RIGHT pelvis 3.3 x 2.5 cm at site of previous abscess, question recurrent abscess.  No definite evidence of bowel obstruction.  Persistent enlargement and inflammation  of the appendix as well as edema of a small bowel loop in the RIGHT pelvis.  Decreased LEFT pleural effusion and basilar atelectasis since prior study.   Electronically Signed   By: Mark  Boles M.D.   On: 07/26/2015 15:55   Ir Image Guided Drainage Percut Cath  Peritoneal Retroperit  07/27/2015   CLINICAL DATA:  This is a patient who is very well known to the interventional radiology service with past medical history significant for malignant GIST with peritoneal carcinomatosis who was subsequently found to have ruptured appendicitis on CT scan performed 05/13/2015 for which the patient underwent a technically successful CT-guided percutaneous drainage catheter placement on 05/13/2015.  Subsequent CT imaging and drainage catheter injections demonstrated resolution of the periappendiceal abscess and the fistulous connection to the residual appendix and as such, the percutaneous drainage catheter was removed on 06/12/2015.  Since that time, the patient has undergone serial ultrasound-guided paracenteses during which time, it was noted the patient was having recurrent apparent purulent appearing discharge from the track of the prior right lower quadrant percutaneous drainage catheter placement.  CT scan of the abdomen and pelvis performed 07/26/2015 demonstrates interval reaccumulation of a tiny periappendiceal abscess as well as a phlegmonous fluid collection within the ventral wall of the right lower abdominal quadrant.  Given the patient's poor operative candidacy, request made for attempted fluoroscopic guided placement of a new percutaneous drainage catheter within the right lower abdominal quadrant. Additionally, request made for a repeat ultrasound-guided paracentesis.  EXAM: 1. IR IMAGE GUIDED DRAINAGE PERCUT CATH  PERITONEAL RETROPERIT 2. IR PARACENTESIS  COMPARISON:  Fluoroscopic guided percutaneous drainage catheter injection -06/12/2015; 06/01/2015; CT abdomen pelvis- 07/26/2015; 07/16/2015; 06/08/2015;  Ultrasound-guided paracentesis - 07/14/2015; 07/01/2015  CONTRAST:  10 cc Omnipaque 300, administered via the track of the prior right lower quadrant percutaneous drainage catheter  MEDICATIONS: Versed at 1 mg IV; Fentanyl 25 mcg IV.  Sedation time: 20 minutes  FLUOROSCOPY TIME:  1 minutes, 54 seconds (103 mGy).  TECHNIQUE: Informed written consent was obtained from the patient after a discussion of the risks, benefits and alternatives to treatment. Questions regarding the procedure were encouraged and answered. A timeout was performed prior to the initiation of the procedure.  The right lower abdominal quadrant and site of the prior right lower quadrant percutaneous drainage catheter ostomy was prepped and draped in the usual sterile fashion, and a sterile drape was applied covering the operative field. Maximum barrier sterile technique with sterile gowns and gloves were used for the procedure. A timeout was performed prior to the initiation of the procedure. Local anesthesia was provided with 1% lidocaine.  Preprocedural spot fluoroscopic image was obtained of the abdomen with a radiopaque clamp demarcating the location of the access site of the prior percutaneous drainage catheter.  The residual external portion of the right lower quadrant percutaneous drainage catheter was cannulated with a Christmas tree adapter. Contrast injection confirmed patency of the track with persistent fistulous connection with the residual ill-defined fluid collection within the right lower abdominal quadrant and the residual appendix with passage of contrast through the appendix to the level of the cecum.  With the use of a regular glidewire, a Kumpe the catheter was manipulated through the track to the level of the residual fluid collection with the right lower abdominal quadrant. Contrast injection confirmed appropriate positioning.   The surrounding subcutaneous tissues shortness of size with 1% lidocaine. Over a short Amplatz wire,  a new 10 French percutaneous drainage catheter was then advanced with and coiled and locked within the residual ill-defined fluid collection with the right lower abdominal quadrant. Contrast injection confirmed appropriate positioning.  External portion of the percutaneous drainage catheter was secured at the skin exit site within interrupted suture. The percutaneous drainage catheter was connected to a gravity bag. A dressing was placed. The patient tolerated the procedure well without immediate postprocedural complication.  Attention was now paid towards performance of the paracentesis.  Initial ultrasound scanning demonstrates a moderate amount of ascites within the right lower abdominal quadrant. The right lower abdomen was prepped and draped in the usual sterile fashion. 1% lidocaine with epinephrine was used for local anesthesia. Under direct ultrasound guidance, a 19 gauge, 7-cm, Yueh catheter was introduced. An ultrasound image was saved for documentation purposed. The paracentesis was performed. The catheter was removed and a dressing was applied.  The patient tolerated both above procedures well without immediate postprocedural complication.  FINDINGS: Contrast injection of the exit site of the prior right lower quadrant percutaneous drainage catheter demonstrates a persistent track to the ill-defined fluid collections seen on preceding abdominal CT as well as a persistent fistulous connection to the residual appendix and cecum.  After successful fluoroscopic guided placement, the new percutaneous drainage catheter is coiled and locked within the ill-defined fluid collection with the right lower abdominal quadrant.  Ultrasound-guided paracentesis yielded approximately 2 L of serous appearing ascitic fluid.  IMPRESSION: 1. Successful fluoroscopic guided recannulization of the right lower quadrant percutaneous drainage catheter tract with new 10 French percutaneous drainage catheter coiled and locked within  the ill-defined residual fluid collection with the right lower abdominal quadrant. Contrast injection confirms persistent fistulous connection with this ill-defined collection and the residual appendix and cecum. 2. Successful ultrasound-guided paracentesis yielding 2 L of serous ascitic fluid.   Electronically Signed   By: Sandi Mariscal M.D.   On: 07/27/2015 13:19   Ir Paracentesis  07/27/2015   CLINICAL DATA:  This is a patient who is very well known to the interventional radiology service with past medical history significant for malignant GIST with peritoneal carcinomatosis who was subsequently found to have ruptured appendicitis on CT scan performed 05/13/2015 for which the patient underwent a technically successful CT-guided percutaneous drainage catheter placement on 05/13/2015.  Subsequent CT imaging and drainage catheter injections demonstrated resolution of the periappendiceal abscess and the fistulous connection to the residual appendix and as such, the percutaneous drainage catheter was removed on 06/12/2015.  Since that time, the patient has undergone serial ultrasound-guided paracenteses during which time, it was noted the patient was having recurrent apparent purulent appearing discharge from the track of the prior right lower quadrant percutaneous drainage catheter placement.  CT scan of the abdomen and pelvis performed 07/26/2015 demonstrates interval reaccumulation of a tiny periappendiceal abscess as well as a phlegmonous fluid collection within the ventral wall of the right lower abdominal quadrant.  Given the patient's poor operative candidacy, request made for attempted fluoroscopic guided placement of a new percutaneous drainage catheter within the right lower abdominal quadrant. Additionally, request made for a repeat ultrasound-guided paracentesis.  EXAM: 1. IR IMAGE GUIDED DRAINAGE PERCUT CATH  PERITONEAL RETROPERIT 2. IR PARACENTESIS  COMPARISON:  Fluoroscopic guided percutaneous drainage  catheter injection -06/12/2015; 06/01/2015; CT abdomen pelvis- 07/26/2015; 07/16/2015; 06/08/2015; Ultrasound-guided paracentesis - 07/14/2015; 07/01/2015  CONTRAST:  10 cc Omnipaque 300, administered via the  track of the prior right lower quadrant percutaneous drainage catheter  MEDICATIONS: Versed at 1 mg IV; Fentanyl 25 mcg IV.  Sedation time: 20 minutes  FLUOROSCOPY TIME:  1 minutes, 54 seconds (103 mGy).  TECHNIQUE: Informed written consent was obtained from the patient after a discussion of the risks, benefits and alternatives to treatment. Questions regarding the procedure were encouraged and answered. A timeout was performed prior to the initiation of the procedure.  The right lower abdominal quadrant and site of the prior right lower quadrant percutaneous drainage catheter ostomy was prepped and draped in the usual sterile fashion, and a sterile drape was applied covering the operative field. Maximum barrier sterile technique with sterile gowns and gloves were used for the procedure. A timeout was performed prior to the initiation of the procedure. Local anesthesia was provided with 1% lidocaine.  Preprocedural spot fluoroscopic image was obtained of the abdomen with a radiopaque clamp demarcating the location of the access site of the prior percutaneous drainage catheter.  The residual external portion of the right lower quadrant percutaneous drainage catheter was cannulated with a Christmas tree adapter. Contrast injection confirmed patency of the track with persistent fistulous connection with the residual ill-defined fluid collection within the right lower abdominal quadrant and the residual appendix with passage of contrast through the appendix to the level of the cecum.  With the use of a regular glidewire, a Kumpe the catheter was manipulated through the track to the level of the residual fluid collection with the right lower abdominal quadrant. Contrast injection confirmed appropriate positioning.  The surrounding subcutaneous tissues shortness of size with 1% lidocaine. Over a short Amplatz wire, a new 10 French percutaneous drainage catheter was then advanced with and coiled and locked within the residual ill-defined fluid collection with the right lower abdominal quadrant. Contrast injection confirmed appropriate positioning.  External portion of the percutaneous drainage catheter was secured at the skin exit site within interrupted suture. The percutaneous drainage catheter was connected to a gravity bag. A dressing was placed. The patient tolerated the procedure well without immediate postprocedural complication.  Attention was now paid towards performance of the paracentesis.  Initial ultrasound scanning demonstrates a moderate amount of ascites within the right lower abdominal quadrant. The right lower abdomen was prepped and draped in the usual sterile fashion. 1% lidocaine with epinephrine was used for local anesthesia. Under direct ultrasound guidance, a 19 gauge, 7-cm, Yueh catheter was introduced. An ultrasound image was saved for documentation purposed. The paracentesis was performed. The catheter was removed and a dressing was applied.  The patient tolerated both above procedures well without immediate postprocedural complication.  FINDINGS: Contrast injection of the exit site of the prior right lower quadrant percutaneous drainage catheter demonstrates a persistent track to the ill-defined fluid collections seen on preceding abdominal CT as well as a persistent fistulous connection to the residual appendix and cecum.  After successful fluoroscopic guided placement, the new percutaneous drainage catheter is coiled and locked within the ill-defined fluid collection with the right lower abdominal quadrant.  Ultrasound-guided paracentesis yielded approximately 2 L of serous appearing ascitic fluid.  IMPRESSION: 1. Successful fluoroscopic guided recannulization of the right lower quadrant  percutaneous drainage catheter tract with new 10 French percutaneous drainage catheter coiled and locked within the ill-defined residual fluid collection with the right lower abdominal quadrant. Contrast injection confirms persistent fistulous connection with this ill-defined collection and the residual appendix and cecum. 2. Successful ultrasound-guided paracentesis yielding 2 L of serous ascitic fluid.  Electronically Signed   By: Sandi Mariscal M.D.   On: 07/27/2015 13:19    Medications: I have reviewed the patient's current medications.  Assessment/Plan:  1. 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 History of anemia, status post a nondiagnostic bone marrow biopsy 10/21/2014  3. NSTEMI March 2014  4. Admission 05/06/2015 with acute onset right abdomen pain-potentially related to acute appendicitis versus pain from carcinomatosis  CT 05/13/2015 consistent with a right lower abdomen abscess, status post catheter drainage by interventional radiology 05/13/2015  Follow-up CT 05/19/2015 showed resolution of the abscess.  Follow-up evaluation in interventional radiology 06/01/2015 showed the abscess cavity had resolved. The abscess drainage catheter communicated with the cecum via the appendix. The catheter was left in place.  CT abdomen/pelvis 06/08/2015 showed the right pelvic drain in place without recurrent or residual surrounding fluid collection. Peritoneal metastasis with slight increase in small to moderate volume of abdominal pelvic ascites.  Drainage  catheter injection 06/12/2015 showed residual tiny fistulous connection with the end of the decompressed abscess cavity within the right lower abdominal quadrant and the residual lumen of the appendix.  Paracentesis 06/12/2015 with 5 liters of fluid removed  Paracentesis 06/19/2015-negative cytology and culture  Removal of abscess drainage catheter 06/23/2015  CT 07/26/2015 with increased right abdominal wall inflammation and increased fluid collection at site of previous right abdomen abscess, ascites, and enlargement/inflammation of the appendix  5. C. difficile colitis 05/10/2015  Mr. Smock is admitted with inflammation at the right abdominal wall and a recurrent right lower quadrant fluid collection. He has been evaluated by the surgical service and will undergo drainage of the right abdomen fluid collection in interventional radiology. He will also undergo a palliative therapeutic paracentesis. The reaccumulation of peritoneal fluid may be related to infection, hypoalbuminemia, or the underlying GIST.  I recommend continuing Faison.    LOS: 1 day   Jonda Alanis  07/27/2015, 2:05 PM

## 2015-07-27 NOTE — Progress Notes (Signed)
[facility] PROGRESS NOTE Assessment/Plan: Sepsis/ Appendiceal abscess: - Started empirically Zosyn, surgery was consulted and recommended IR to place drain. - Can probably send for culture and pathology to see there are any defined malignant cells. - Blood pressure has remained stable, afebrile and leukocytosis resolved. - Change IV fluids to half normal saline KVO.  CKD (chronic kidney disease), stage III: - At baseline continue to monitor.  Chronic anemia - Mild drop in hemoglobin from 9.5-8.0  - Likely due to chronic infection, they continues to drop my need 1-2 units of packed red blood cells transfused.  Anasarca: Unlikely due to heart failure, his lower extremity edema massive ascites, this likely due to low albumin.  Severe protein caloric malnutrition: Start and short 3 times a day. Code Status: full Family Communication: son  Disposition Plan: inpatient   Consultants:  surgey  Procedures:  CT abd and pelvis  Antibiotics:  Zosyn 8.7.2016  HPI/Subjective: No complains abd pain improved.  Objective: Filed Vitals:   07/26/15 1900 07/26/15 2140 07/27/15 0459 07/27/15 0940  BP:  118/57 122/51 125/58  Pulse:  86 70   Temp:  98.5 F (36.9 C) 98.5 F (36.9 C)   TempSrc:  Oral Oral   Resp:  17 18   Height: 5\' 7"  (1.702 m)     Weight: 72.576 kg (160 lb)     SpO2:  98% 98%     Intake/Output Summary (Last 24 hours) at 07/27/15 1102 Last data filed at 07/26/15 2141  Gross per 24 hour  Intake    240 ml  Output      0 ml  Net    240 ml   Filed Weights   07/26/15 1900  Weight: 72.576 kg (160 lb)    Exam:  General: Alert, awake, oriented x3, in no acute distress.  HEENT: No bruits, no goiter.  Heart: Regular rate and rhythm. Lungs: Good air movement, clear Abdomen: Soft, nontender, distended with drainage from his right flank, positive bowel sounds.  Neuro: Grossly intact, nonfocal.   Data Reviewed: Basic Metabolic Panel:  Recent  Labs Lab 07/26/15 1220 07/26/15 1222 07/26/15 1731 07/27/15 0514  NA 140 140 137 138  K 3.6 3.6 3.7 4.0  CL 111 111 113* 112*  CO2 22 24 22 23   GLUCOSE 123* 124* 96 96  BUN 18 18 15 17   CREATININE 1.24 1.28* 1.18 1.26*  CALCIUM 7.9* 7.9* 7.2* 7.7*   Liver Function Tests:  Recent Labs Lab 07/26/15 1220 07/26/15 1731  AST 34 27  ALT 37 28  ALKPHOS 129* 100  BILITOT 0.3 0.4  PROT 6.4* 5.3*  ALBUMIN 2.2* 1.9*    Recent Labs Lab 07/26/15 1220  LIPASE 38   No results for input(s): AMMONIA in the last 168 hours. CBC:  Recent Labs Lab 07/26/15 1222 07/26/15 1731 07/27/15 0514  WBC 11.1* 12.5* 8.1  NEUTROABS 8.9* 10.5*  --   HGB 9.5* 8.5* 8.0*  HCT 30.4* 26.6* 25.4*  MCV 103.1* 101.5* 102.4*  PLT 401* 332 303   Cardiac Enzymes: No results for input(s): CKTOTAL, CKMB, CKMBINDEX, TROPONINI in the last 168 hours. BNP (last 3 results) No results for input(s): BNP in the last 8760 hours.  ProBNP (last 3 results)  Recent Labs  09/12/14 0445  PROBNP 5726.0*    CBG: No results for input(s): GLUCAP in the last 168 hours.  No results found for this or any previous visit (from the past 240 hour(s)).   Studies: Ct Abdomen Pelvis W  Contrast  07/26/2015   CLINICAL DATA:  RIGHT lower quadrant abscess with pus, induration and erythema in RIGHT lower quadrant, recent JP drain removal for appendicitis ; history hypertension, coronary artery disease, stage 3 chronic kidney disease, diastolic CHF, GI stromal tumor with abdominal carcinomatosis  EXAM: CT ABDOMEN AND PELVIS WITH CONTRAST  TECHNIQUE: Multidetector CT imaging of the abdomen and pelvis was performed using the standard protocol following bolus administration of intravenous contrast. Sagittal and coronal MPR images reconstructed from axial data set.  CONTRAST:  12mL OMNIPAQUE IOHEXOL 300 MG/ML SOLN, 165mL OMNIPAQUE IOHEXOL 300 MG/ML SOLN  COMPARISON:  07/16/2015, 06/08/2015, 05/13/2015  FINDINGS: LEFT pleural effusion  and lower lobe atelectasis.  15 mm gallstone in gallbladder.  Liver, spleen, pancreas, kidneys, and adrenal glands normal.  Significant ascites throughout abdomen and pelvis.  RIGHT inguinal hernia containing a small bowel loop without definite evidence of bowel obstruction.  Significant bowel wall thickening of a distal ileal loop in RIGHT pelvis.  Area of diffuse infiltration, fluid, enlargement and poor definition of the lateral RIGHT abdominal wall anterior to the anterior RIGHT iliac bone, could represent a developing abscess though cannot exclude tumor implant along the prior drain tract.  Additional multiloculated collection in the upper RIGHT pelvis at site of large abscess identified on 05/13/2015, now measuring 3.3 x 2.5 cm image 61.  Distal descending and sigmoid diverticulosis.  Unremarkable bladder and ureters.  Small LEFT inguinal hernia containing fat.  No mass, adenopathy, or free air.  Omental implant in the mid abdomen 25 x 13 mm image 32, with scattered additional areas of subtle peritoneal carcinomatosis in the anterior abdomen and mesentery.  Persistent enlargement inflammatory changes of the appendix compatible with appendicitis.  No acute osseous findings.  IMPRESSION: Significant ascites with multiple areas of peritoneal based tumor implants compatible with peritoneal carcinomatosis.  Increased size of an area of infiltrative change at the lateral RIGHT mid abdominal wall anterior to the RIGHT iliac bone, could represent developing inflammatory process/abscess or tumor implantation along prior drainage catheter tract.  Multiloculated collection in upper RIGHT pelvis 3.3 x 2.5 cm at site of previous abscess, question recurrent abscess.  No definite evidence of bowel obstruction.  Persistent enlargement and inflammation of the appendix as well as edema of a small bowel loop in the RIGHT pelvis.  Decreased LEFT pleural effusion and basilar atelectasis since prior study.   Electronically Signed    By: Lavonia Dana M.D.   On: 07/26/2015 15:55    Scheduled Meds: . amLODipine  5 mg Oral Daily  . megestrol  40 mg Oral BID  . metoprolol tartrate  25 mg Oral BID  . mirtazapine  15 mg Oral QHS  . piperacillin-tazobactam (ZOSYN)  IV  3.375 g Intravenous 3 times per day  . saccharomyces boulardii  250 mg Oral BID  . simvastatin  20 mg Oral QPM  . sodium chloride  250 mL Intravenous Once  . sodium chloride  3 mL Intravenous Q12H   Continuous Infusions:   Time Spent: 25 min   Charlynne Cousins  [facility] Pager [phone]. If 7PM-7AM, please contact night-coverage at [email or web address], password [facility] 07/27/2015, 11:02 AM  LOS: 1 day

## 2015-07-27 NOTE — Care Management Note (Signed)
Case Management Note  Patient Details  Name: [name] [name] MRN: [id number] Date of Birth: Nov 09, 1929  Subjective/Objective:           Increased purulent drainage from surgical site         Action/Plan: Date:  July 27, 2015 U.R. performed for needs and level of care. Will continue to follow for Case Management needs.  Velva Harman, RN, BSN, [place]   [phone]  Expected Discharge Date:  07/29/15               Expected Discharge Plan:  Home/Self Care  In-House Referral:  NA  Discharge planning Services  CM Consult  Post Acute Care Choice:  NA Choice offered to:  NA  DME Arranged:    DME Agency:     HH Arranged:    HH Agency:     Status of Service:  In process, will continue to follow  Medicare Important Message Given:    Date Medicare IM Given:    Medicare IM give by:    Date Additional Medicare IM Given:    Additional Medicare Important Message give by:     If discussed at [place] of Stay Meetings, dates discussed:    Additional Comments:  Leeroy Cha, RN 07/27/2015, 12:07 PM

## 2015-07-27 NOTE — Progress Notes (Signed)
Patient ID: [name] [name], male   DOB: 1929-07-16, 81 y.o.   MRN: [id number]    Referring Physician(s): CCS  Chief Complaint:  Appendiceal abscess  Subjective: Patient with hx GIST and familiar to IR service from prior drainage of an appendiceal abscess on 05/13/15. On 06/23/15 follow-up catheter injection revealed resolution of previously noted fistulous connection and the drain was removed. The patient has continued to have some intermittent drainage from the tube exit site and recent CT demonstrated significant ascites, peritoneal carcinomatosis as well as residual abscess in right pelvis measuring 3.3 x 2.5 cm. Request now received for repeat pelvic abscess drainage as well as paracentesis. Patient currently denies fevers, significant dyspnea, chest pain, significant abdominal pain, nausea/ vomiting or abnormal bleeding.  Allergies: Review of patient's allergies indicates no known allergies.  Medications: Prior to Admission medications   Medication Sig Start Date End Date Taking? Authorizing Provider  amLODipine (NORVASC) 5 MG tablet Take 5 mg by mouth daily. 03/17/15  Yes Historical Provider, MD  furosemide (LASIX) 20 MG tablet Take 1 tablet (20 mg total) by mouth daily. 05/20/15  Yes Hosie Poisson, MD  imatinib (GLEEVEC) 400 MG tablet Take 1 tablet (400 mg total) by mouth daily. Take with meals and large glass of water.Caution:Chemotherapy. 02/13/15  Yes Ladell Pier, MD  megestrol (MEGACE) 40 MG/ML suspension Take 40 mg by mouth 2 (two) times daily.   Yes Historical Provider, MD  metoprolol tartrate (LOPRESSOR) 25 MG tablet Take 1 tablet (25 mg total) by mouth 2 (two) times daily. 03/06/13  Yes Costin Karlyne Greenspan, MD  mirtazapine (REMERON) 15 MG tablet Take 15 mg by mouth at bedtime.   Yes Historical Provider, MD  simvastatin (ZOCOR) 20 MG tablet Take 20 mg by mouth every evening.   Yes Historical Provider, MD  saccharomyces boulardii (FLORASTOR) 250 MG capsule Take 1 capsule (250 mg total) by  mouth 2 (two) times daily. Patient not taking: Reported on 07/26/2015 06/25/15   Ladell Pier, MD     Vital Signs: BP 125/58 mmHg  Pulse 70  Temp(Src) 98.5 F (36.9 C) (Oral)  Resp 18  Ht 5\' 7"  (1.702 m)  Wt 160 lb (72.576 kg)  BMI 25.05 kg/m2  SpO2 98%  Physical Exam patient is awake, alert. Chest with diminished breath sounds at bases. Heart with regular rate and rhythm. Abdomen soft, distended, draining wound right lower quadrant, positive bowel sounds. Extremities with full range of motion  Imaging: Ct Abdomen Pelvis W Contrast  07/26/2015   CLINICAL DATA:  RIGHT lower quadrant abscess with pus, induration and erythema in RIGHT lower quadrant, recent JP drain removal for appendicitis ; history hypertension, coronary artery disease, stage 3 chronic kidney disease, diastolic CHF, GI stromal tumor with abdominal carcinomatosis  EXAM: CT ABDOMEN AND PELVIS WITH CONTRAST  TECHNIQUE: Multidetector CT imaging of the abdomen and pelvis was performed using the standard protocol following bolus administration of intravenous contrast. Sagittal and coronal MPR images reconstructed from axial data set.  CONTRAST:  22mL OMNIPAQUE IOHEXOL 300 MG/ML SOLN, 154mL OMNIPAQUE IOHEXOL 300 MG/ML SOLN  COMPARISON:  07/16/2015, 06/08/2015, 05/13/2015  FINDINGS: LEFT pleural effusion and lower lobe atelectasis.  15 mm gallstone in gallbladder.  Liver, spleen, pancreas, kidneys, and adrenal glands normal.  Significant ascites throughout abdomen and pelvis.  RIGHT inguinal hernia containing a small bowel loop without definite evidence of bowel obstruction.  Significant bowel wall thickening of a distal ileal loop in RIGHT pelvis.  Area of diffuse infiltration, fluid, enlargement and poor  definition of the lateral RIGHT abdominal wall anterior to the anterior RIGHT iliac bone, could represent a developing abscess though cannot exclude tumor implant along the prior drain tract.  Additional multiloculated collection in the  upper RIGHT pelvis at site of large abscess identified on 05/13/2015, now measuring 3.3 x 2.5 cm image 61.  Distal descending and sigmoid diverticulosis.  Unremarkable bladder and ureters.  Small LEFT inguinal hernia containing fat.  No mass, adenopathy, or free air.  Omental implant in the mid abdomen 25 x 13 mm image 32, with scattered additional areas of subtle peritoneal carcinomatosis in the anterior abdomen and mesentery.  Persistent enlargement inflammatory changes of the appendix compatible with appendicitis.  No acute osseous findings.  IMPRESSION: Significant ascites with multiple areas of peritoneal based tumor implants compatible with peritoneal carcinomatosis.  Increased size of an area of infiltrative change at the lateral RIGHT mid abdominal wall anterior to the RIGHT iliac bone, could represent developing inflammatory process/abscess or tumor implantation along prior drainage catheter tract.  Multiloculated collection in upper RIGHT pelvis 3.3 x 2.5 cm at site of previous abscess, question recurrent abscess.  No definite evidence of bowel obstruction.  Persistent enlargement and inflammation of the appendix as well as edema of a small bowel loop in the RIGHT pelvis.  Decreased LEFT pleural effusion and basilar atelectasis since prior study.   Electronically Signed   By: Lavonia Dana M.D.   On: 07/26/2015 15:55    Labs:  CBC:  Recent Labs  07/08/15 0828 07/26/15 1222 07/26/15 1731 07/27/15 0514  WBC 7.3 11.1* 12.5* 8.1  HGB 8.3* 9.5* 8.5* 8.0*  HCT 26.1* 30.4* 26.6* 25.4*  PLT 371 401* 332 303    COAGS:  Recent Labs  10/21/14 0820 01/13/15 0100 05/06/15 0304 07/26/15 1731  INR 1.07 1.05 1.14 1.17  APTT 29  --   --  27    BMP:  Recent Labs  07/26/15 1220 07/26/15 1222 07/26/15 1731 07/27/15 0514  NA 140 140 137 138  K 3.6 3.6 3.7 4.0  CL 111 111 113* 112*  CO2 22 24 22 23   GLUCOSE 123* 124* 96 96  BUN 18 18 15 17   CALCIUM 7.9* 7.9* 7.2* 7.7*  CREATININE 1.24  1.28* 1.18 1.26*  GFRNONAA 52* 50* 55* 51*  GFRAA 60* 58* >60 59*    LIVER FUNCTION TESTS:  Recent Labs  06/25/15 0835 07/08/15 0828 07/26/15 1220 07/26/15 1731  BILITOT 0.28 0.29 0.3 0.4  AST 28 27 34 27  ALT 32 34 37 28  ALKPHOS 95 105 129* 100  PROT 5.5* 5.9* 6.4* 5.3*  ALBUMIN 1.7* 1.7* 2.2* 1.9*    Assessment and Plan: Patient with history of GIST resection/recurrence, ascites and prior drainage of an 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 Plan is for image guided fistula injection with replacement of drainage catheter as well as paracentesis today. Details/risks of procedures, including but not limited to, internal bleeding, sepsis, injury to adjacent organs discussed with patient and family with their understanding and consent.   Signed: D. Rowe Robert 07/27/2015, 11:26 AM   I spent a total of 15 minutes in face to face in clinical consultation/evaluation, greater than 50% of which was counseling/coordinating care for right lower quadrant abscess drainage/paracentesis

## 2015-07-27 NOTE — Progress Notes (Signed)
Subjective: Drain site RLQ with purulent drainage, actually feels better since it started draining last PM.  His son says it drained a good deal since it started last PM.  We are going to Ask IR to replace drain if possible.  Dr. Learta Codding is going to request a repeat paracentesis also.  Objective: Vital signs in last 24 hours: Temp:  [98.3 F (36.8 C)-98.9 F (37.2 C)] 98.5 F (36.9 C) (08/08 0459) Pulse Rate:  [70-86] 70 (08/08 0459) Resp:  [15-22] 18 (08/08 0459) BP: (118-147)/(46-57) 122/51 mmHg (08/08 0459) SpO2:  [96 %-100 %] 98 % (08/08 0459) Weight:  [72.576 kg (160 lb)] 72.576 kg (160 lb) (08/07 1900) Last BM Date: 07/26/15 (per pt) PO 240, NPO now Afebrile, VSS Creatinine is up to 1.26 WBC OK H/H down some Intake/Output from previous day: 08/07 0701 - 08/08 0700 In: 240 [P.O.:240] Out: -  Intake/Output this shift:    General appearance: alert, cooperative and no distress GI: RLQ old drain site open and draining purulent fluid, some erythema/cellulits around the site, very tender.  Lab Results:   Recent Labs  07/26/15 1731 07/27/15 0514  WBC 12.5* 8.1  HGB 8.5* 8.0*  HCT 26.6* 25.4*  PLT 332 303    BMET  Recent Labs  07/26/15 1731 07/27/15 0514  NA 137 138  K 3.7 4.0  CL 113* 112*  CO2 22 23  GLUCOSE 96 96  BUN 15 17  CREATININE 1.18 1.26*  CALCIUM 7.2* 7.7*   PT/INR  Recent Labs  07/26/15 1731  LABPROT 15.1  INR 1.17     Recent Labs Lab 07/26/15 1220 07/26/15 1731  AST 34 27  ALT 37 28  ALKPHOS 129* 100  BILITOT 0.3 0.4  PROT 6.4* 5.3*  ALBUMIN 2.2* 1.9*     Lipase     Component Value Date/Time   LIPASE 38 07/26/2015 1220     Studies/Results: Ct Abdomen Pelvis W Contrast  07/26/2015   CLINICAL DATA:  RIGHT lower quadrant abscess with pus, induration and erythema in RIGHT lower quadrant, recent JP drain removal for appendicitis ; history hypertension, coronary artery disease, stage 3 chronic kidney disease, diastolic CHF,  GI stromal tumor with abdominal carcinomatosis  EXAM: CT ABDOMEN AND PELVIS WITH CONTRAST  TECHNIQUE: Multidetector CT imaging of the abdomen and pelvis was performed using the standard protocol following bolus administration of intravenous contrast. Sagittal and coronal MPR images reconstructed from axial data set.  CONTRAST:  53mL OMNIPAQUE IOHEXOL 300 MG/ML SOLN, 196mL OMNIPAQUE IOHEXOL 300 MG/ML SOLN  COMPARISON:  07/16/2015, 06/08/2015, 05/13/2015  FINDINGS: LEFT pleural effusion and lower lobe atelectasis.  15 mm gallstone in gallbladder.  Liver, spleen, pancreas, kidneys, and adrenal glands normal.  Significant ascites throughout abdomen and pelvis.  RIGHT inguinal hernia containing a small bowel loop without definite evidence of bowel obstruction.  Significant bowel wall thickening of a distal ileal loop in RIGHT pelvis.  Area of diffuse infiltration, fluid, enlargement and poor definition of the lateral RIGHT abdominal wall anterior to the anterior RIGHT iliac bone, could represent a developing abscess though cannot exclude tumor implant along the prior drain tract.  Additional multiloculated collection in the upper RIGHT pelvis at site of large abscess identified on 05/13/2015, now measuring 3.3 x 2.5 cm image 61.  Distal descending and sigmoid diverticulosis.  Unremarkable bladder and ureters.  Small LEFT inguinal hernia containing fat.  No mass, adenopathy, or free air.  Omental implant in the mid abdomen 25 x 13 mm image 32,  with scattered additional areas of subtle peritoneal carcinomatosis in the anterior abdomen and mesentery.  Persistent enlargement inflammatory changes of the appendix compatible with appendicitis.  No acute osseous findings.  IMPRESSION: Significant ascites with multiple areas of peritoneal based tumor implants compatible with peritoneal carcinomatosis.  Increased size of an area of infiltrative change at the lateral RIGHT mid abdominal wall anterior to the RIGHT iliac bone, could  represent developing inflammatory process/abscess or tumor implantation along prior drainage catheter tract.  Multiloculated collection in upper RIGHT pelvis 3.3 x 2.5 cm at site of previous abscess, question recurrent abscess.  No definite evidence of bowel obstruction.  Persistent enlargement and inflammation of the appendix as well as edema of a small bowel loop in the RIGHT pelvis.  Decreased LEFT pleural effusion and basilar atelectasis since prior study.   Electronically Signed   By: Lavonia Dana M.D.   On: 07/26/2015 15:55    Medications: . amLODipine  5 mg Oral Daily  . megestrol  40 mg Oral BID  . metoprolol tartrate  25 mg Oral BID  . mirtazapine  15 mg Oral QHS  . piperacillin-tazobactam (ZOSYN)  IV  3.375 g Intravenous 3 times per day  . saccharomyces boulardii  250 mg Oral BID  . simvastatin  20 mg Oral QPM  . sodium chloride  250 mL Intravenous Once  . sodium chloride  3 mL Intravenous Q12H     Prior to Admission medications   Medication Sig Start Date End Date Taking? Authorizing Provider  amLODipine (NORVASC) 5 MG tablet Take 5 mg by mouth daily. 03/17/15  Yes Historical Provider, MD  furosemide (LASIX) 20 MG tablet Take 1 tablet (20 mg total) by mouth daily. 05/20/15  Yes Hosie Poisson, MD  imatinib (GLEEVEC) 400 MG tablet Take 1 tablet (400 mg total) by mouth daily. Take with meals and large glass of water.Caution:Chemotherapy. 02/13/15  Yes Ladell Pier, MD  megestrol (MEGACE) 40 MG/ML suspension Take 40 mg by mouth 2 (two) times daily.   Yes Historical Provider, MD  metoprolol tartrate (LOPRESSOR) 25 MG tablet Take 1 tablet (25 mg total) by mouth 2 (two) times daily. 03/06/13  Yes Costin Karlyne Greenspan, MD  mirtazapine (REMERON) 15 MG tablet Take 15 mg by mouth at bedtime.   Yes Historical Provider, MD  simvastatin (ZOCOR) 20 MG tablet Take 20 mg by mouth every evening.   Yes Historical Provider, MD  saccharomyces boulardii (FLORASTOR) 250 MG capsule Take 1 capsule (250 mg total) by  mouth 2 (two) times daily. Patient not taking: Reported on 07/26/2015 06/25/15   Ladell Pier, MD     Assessment/Plan Abdominal pain RLQ, possible appendicitis vs carcinomatosis 5/17-05/20/15. IR percutaneous drain RLQ abscess 05/12/25  -  drain removed 06/23/15. Hx of GIST tumor with carcinomatosis  S/p multiple paracentesis  Hx of chronic renal disease, stage III - creatinine is up some this AM Anemia EXPLORATORY LAPAROTOMY, OPEN RESECTION OF MESENTERIC AND INTESTINAL MASS; Surgeon: [name], MD, 06/01/13. C diff colitis - flagyl, and rocephin Hx of NSTEMI/CAD Hypertension Hx of Intraperitoneal heomrrhage, Acute respiratory failure, shock, 08/2014 Dyslipidemia Antibiotics: Cipro/Flagy, started 8/7 and converted to Zosyn PM 8/7 -dose 1 today will be first full day of Zosyn DVT:  SCD/waiting on procedure prior to Heparin or Lovenox   Plan:  We will ask IR to see and place drain, Dr. Learta Codding is ordering paracentesis., antibiotics, and I have placed him on some IV fluids while he is NPO, with rising creatinine.  Recheck labs tomorrow.  He can have heparin or Lovenox after IR completes procedures.    LOS: 1 day    Margareta Laureano 07/27/2015

## 2015-07-28 LAB — CBC
HEMATOCRIT: 25.3 % — AB (ref 39.0–52.0)
HEMOGLOBIN: 8.1 g/dL — AB (ref 13.0–17.0)
MCH: 32.8 pg (ref 26.0–34.0)
MCHC: 32 g/dL (ref 30.0–36.0)
MCV: 102.4 fL — ABNORMAL HIGH (ref 78.0–100.0)
PLATELETS: 300 10*3/uL (ref 150–400)
RBC: 2.47 MIL/uL — ABNORMAL LOW (ref 4.22–5.81)
RDW: 17.1 % — ABNORMAL HIGH (ref 11.5–15.5)
WBC: 6.3 10*3/uL (ref 4.0–10.5)

## 2015-07-28 LAB — BASIC METABOLIC PANEL
ANION GAP: 4 — AB (ref 5–15)
BUN: 19 mg/dL (ref 6–20)
CO2: 23 mmol/L (ref 22–32)
Calcium: 7.6 mg/dL — ABNORMAL LOW (ref 8.9–10.3)
Chloride: 112 mmol/L — ABNORMAL HIGH (ref 101–111)
Creatinine, Ser: 1.19 mg/dL (ref 0.61–1.24)
GFR calc Af Amer: >60 mL/min (ref >60)
GFR calc non Af Amer: 54 mL/min — ABNORMAL LOW (ref >60)
Glucose, Bld: 108 mg/dL — ABNORMAL HIGH (ref 65–99)
Potassium: 4.2 mmol/L (ref 3.5–5.1)
Sodium: 139 mmol/L (ref 135–145)

## 2015-07-28 MED ORDER — ALUM & MAG HYDROXIDE-SIMETH 200-200-20 MG/5ML PO SUSP
15.0000 mL | ORAL | Status: DC | PRN
  Administered 2015-07-28: 15 mL via ORAL
  Filled 2015-07-28: qty 30

## 2015-07-28 MED ORDER — AMOXICILLIN-POT CLAVULANATE 875-125 MG PO TABS: 1.0000 | ORAL_TABLET | Freq: Two times a day (BID) | ORAL | Status: DC

## 2015-07-28 MED ORDER — PANTOPRAZOLE SODIUM 40 MG PO TBEC
40.0000 mg | DELAYED_RELEASE_TABLET | Freq: Every day | ORAL | Status: DC
  Administered 2015-07-28 – 2015-07-29 (×2): 40 mg via ORAL
  Filled 2015-07-28 (×2): qty 1

## 2015-07-28 NOTE — Progress Notes (Signed)
While we would recommend the patient stay here for another couple of days of IV antibiotics given the intra-abdominal abscess and just having the drain placed, the patient and family desire discharge home today.  Dr. Harlow Asa talked to the son on the telephone.    We recommend that he go home with 2 weeks of antibiotics (Augmentin would be reasonable).  We will arrange for follow up with Dr. [name] within 2 weeks.  Called IR to have them make a follow up in [facility].  They said to flush the drain 3 times a day to prevent it from getting clogged.  Jomarie Longs, PA-C [facility] [phone]  Earnstine Regal, MD, [facility] Office: [phone]

## 2015-07-28 NOTE — Care Management Important Message (Signed)
Important Message  Patient Details  Name: Jobe Mutch MRN: [id number] Date of Birth: 01-05-1929   Medicare Important Message Given:  Yes-second notification given    Camillo Flaming 07/28/2015, 12:12 [place] Message  Patient Details  Name: Dowell Hoon MRN: [id number] Date of Birth: 1929/04/30   Medicare Important Message Given:  Yes-second notification given    Camillo Flaming 07/28/2015, 12:11 PM

## 2015-07-28 NOTE — Discharge Instructions (Signed)
Per IR:  Flush drains with 67mL of normal saline three times a day.  Call office to confirm date and time of appointment.

## 2015-07-28 NOTE — Progress Notes (Signed)
Subjective: He feels better and wants to go home.  Cellulitis is better, but still there.  He has some drainage around the catheter dressing,and his gown; may be from the old site.    Objective: Vital signs in last 24 hours: Temp:  [97.8 F (36.6 C)-98.4 F (36.9 C)] 97.8 F (36.6 C) (08/09 0525) Pulse Rate:  [71-82] 71 (08/09 0525) Resp:  [13-24] 18 (08/09 0525) BP: (119-146)/(42-85) 120/56 mmHg (08/09 0525) SpO2:  [99 %-100 %] 99 % (08/09 0525) Last BM Date: 07/26/15 (per pt) Nothing PO recorded;  Regular diet 10 ml from the drain placed Afebrile, VSS Labs OK   Intake/Output from previous day: 08/08 0701 - 08/09 0700 In: 292.2 [I.V.:42.2; IV Piggyback:250] Out: 285 [Urine:275; Drains:10] Intake/Output this shift:    General appearance: alert, cooperative and no distress GI: soft, site is still sore, but less so than yesterday.  some drainage on dressing, cellulitis is better.  Fluid in the drain is mostly clear serous, a little cloudy  Lab Results:   Recent Labs  07/27/15 0514 07/28/15 0555  WBC 8.1 6.3  HGB 8.0* 8.1*  HCT 25.4* 25.3*  PLT 303 300    BMET  Recent Labs  07/27/15 0514 07/28/15 0555  NA 138 139  K 4.0 4.2  CL 112* 112*  CO2 23 23  GLUCOSE 96 108*  BUN 17 19  CREATININE 1.26* 1.19  CALCIUM 7.7* 7.6*   PT/INR  Recent Labs  07/26/15 1731  LABPROT 15.1  INR 1.17     Recent Labs Lab 07/26/15 1220 07/26/15 1731  AST 34 27  ALT 37 28  ALKPHOS 129* 100  BILITOT 0.3 0.4  PROT 6.4* 5.3*  ALBUMIN 2.2* 1.9*     Lipase     Component Value Date/Time   LIPASE 38 07/26/2015 1220     Studies/Results: Ct Abdomen Pelvis W Contrast  07/26/2015   CLINICAL DATA:  RIGHT lower quadrant abscess with pus, induration and erythema in RIGHT lower quadrant, recent JP drain removal for appendicitis ; history hypertension, coronary artery disease, stage 3 chronic kidney disease, diastolic CHF, GI stromal tumor with abdominal carcinomatosis   EXAM: CT ABDOMEN AND PELVIS WITH CONTRAST  TECHNIQUE: Multidetector CT imaging of the abdomen and pelvis was performed using the standard protocol following bolus administration of intravenous contrast. Sagittal and coronal MPR images reconstructed from axial data set.  CONTRAST:  42mL OMNIPAQUE IOHEXOL 300 MG/ML SOLN, 143mL OMNIPAQUE IOHEXOL 300 MG/ML SOLN  COMPARISON:  07/16/2015, 06/08/2015, 05/13/2015  FINDINGS: LEFT pleural effusion and lower lobe atelectasis.  15 mm gallstone in gallbladder.  Liver, spleen, pancreas, kidneys, and adrenal glands normal.  Significant ascites throughout abdomen and pelvis.  RIGHT inguinal hernia containing a small bowel loop without definite evidence of bowel obstruction.  Significant bowel wall thickening of a distal ileal loop in RIGHT pelvis.  Area of diffuse infiltration, fluid, enlargement and poor definition of the lateral RIGHT abdominal wall anterior to the anterior RIGHT iliac bone, could represent a developing abscess though cannot exclude tumor implant along the prior drain tract.  Additional multiloculated collection in the upper RIGHT pelvis at site of large abscess identified on 05/13/2015, now measuring 3.3 x 2.5 cm image 61.  Distal descending and sigmoid diverticulosis.  Unremarkable bladder and ureters.  Small LEFT inguinal hernia containing fat.  No mass, adenopathy, or free air.  Omental implant in the mid abdomen 25 x 13 mm image 32, with scattered additional areas of subtle peritoneal carcinomatosis in the anterior  abdomen and mesentery.  Persistent enlargement inflammatory changes of the appendix compatible with appendicitis.  No acute osseous findings.  IMPRESSION: Significant ascites with multiple areas of peritoneal based tumor implants compatible with peritoneal carcinomatosis.  Increased size of an area of infiltrative change at the lateral RIGHT mid abdominal wall anterior to the RIGHT iliac bone, could represent developing inflammatory  process/abscess or tumor implantation along prior drainage catheter tract.  Multiloculated collection in upper RIGHT pelvis 3.3 x 2.5 cm at site of previous abscess, question recurrent abscess.  No definite evidence of bowel obstruction.  Persistent enlargement and inflammation of the appendix as well as edema of a small bowel loop in the RIGHT pelvis.  Decreased LEFT pleural effusion and basilar atelectasis since prior study.   Electronically Signed   By: Lavonia Dana M.D.   On: 07/26/2015 15:55   Ir Image Guided Drainage Percut Cath  Peritoneal Retroperit  07/27/2015   CLINICAL DATA:  This is a patient who is very well known to the interventional radiology service with past medical history significant for malignant GIST with peritoneal carcinomatosis who was subsequently found to have ruptured appendicitis on CT scan performed 05/13/2015 for which the patient underwent a technically successful CT-guided percutaneous drainage catheter placement on 05/13/2015.  Subsequent CT imaging and drainage catheter injections demonstrated resolution of the periappendiceal abscess and the fistulous connection to the residual appendix and as such, the percutaneous drainage catheter was removed on 06/12/2015.  Since that time, the patient has undergone serial ultrasound-guided paracenteses during which time, it was noted the patient was having recurrent apparent purulent appearing discharge from the track of the prior right lower quadrant percutaneous drainage catheter placement.  CT scan of the abdomen and pelvis performed 07/26/2015 demonstrates interval reaccumulation of a tiny periappendiceal abscess as well as a phlegmonous fluid collection within the ventral wall of the right lower abdominal quadrant.  Given the patient's poor operative candidacy, request made for attempted fluoroscopic guided placement of a new percutaneous drainage catheter within the right lower abdominal quadrant. Additionally, request made for a  repeat ultrasound-guided paracentesis.  EXAM: 1. IR IMAGE GUIDED DRAINAGE PERCUT CATH  PERITONEAL RETROPERIT 2. IR PARACENTESIS  COMPARISON:  Fluoroscopic guided percutaneous drainage catheter injection -06/12/2015; 06/01/2015; CT abdomen pelvis- 07/26/2015; 07/16/2015; 06/08/2015; Ultrasound-guided paracentesis - 07/14/2015; 07/01/2015  CONTRAST:  10 cc Omnipaque 300, administered via the track of the prior right lower quadrant percutaneous drainage catheter  MEDICATIONS: Versed at 1 mg IV; Fentanyl 25 mcg IV.  Sedation time: 20 minutes  FLUOROSCOPY TIME:  1 minutes, 54 seconds (103 mGy).  TECHNIQUE: Informed written consent was obtained from the patient after a discussion of the risks, benefits and alternatives to treatment. Questions regarding the procedure were encouraged and answered. A timeout was performed prior to the initiation of the procedure.  The right lower abdominal quadrant and site of the prior right lower quadrant percutaneous drainage catheter ostomy was prepped and draped in the usual sterile fashion, and a sterile drape was applied covering the operative field. Maximum barrier sterile technique with sterile gowns and gloves were used for the procedure. A timeout was performed prior to the initiation of the procedure. Local anesthesia was provided with 1% lidocaine.  Preprocedural spot fluoroscopic image was obtained of the abdomen with a radiopaque clamp demarcating the location of the access site of the prior percutaneous drainage catheter.  The residual external portion of the right lower quadrant percutaneous drainage catheter was cannulated with a Christmas tree adapter. Contrast injection confirmed  patency of the track with persistent fistulous connection with the residual ill-defined fluid collection within the right lower abdominal quadrant and the residual appendix with passage of contrast through the appendix to the level of the cecum.  With the use of a regular glidewire, a Kumpe the  catheter was manipulated through the track to the level of the residual fluid collection with the right lower abdominal quadrant. Contrast injection confirmed appropriate positioning. The surrounding subcutaneous tissues shortness of size with 1% lidocaine. Over a short Amplatz wire, a new 10 French percutaneous drainage catheter was then advanced with and coiled and locked within the residual ill-defined fluid collection with the right lower abdominal quadrant. Contrast injection confirmed appropriate positioning.  External portion of the percutaneous drainage catheter was secured at the skin exit site within interrupted suture. The percutaneous drainage catheter was connected to a gravity bag. A dressing was placed. The patient tolerated the procedure well without immediate postprocedural complication.  Attention was now paid towards performance of the paracentesis.  Initial ultrasound scanning demonstrates a moderate amount of ascites within the right lower abdominal quadrant. The right lower abdomen was prepped and draped in the usual sterile fashion. 1% lidocaine with epinephrine was used for local anesthesia. Under direct ultrasound guidance, a 19 gauge, 7-cm, Yueh catheter was introduced. An ultrasound image was saved for documentation purposed. The paracentesis was performed. The catheter was removed and a dressing was applied.  The patient tolerated both above procedures well without immediate postprocedural complication.  FINDINGS: Contrast injection of the exit site of the prior right lower quadrant percutaneous drainage catheter demonstrates a persistent track to the ill-defined fluid collections seen on preceding abdominal CT as well as a persistent fistulous connection to the residual appendix and cecum.  After successful fluoroscopic guided placement, the new percutaneous drainage catheter is coiled and locked within the ill-defined fluid collection with the right lower abdominal quadrant.   Ultrasound-guided paracentesis yielded approximately 2 L of serous appearing ascitic fluid.  IMPRESSION: 1. Successful fluoroscopic guided recannulization of the right lower quadrant percutaneous drainage catheter tract with new 10 French percutaneous drainage catheter coiled and locked within the ill-defined residual fluid collection with the right lower abdominal quadrant. Contrast injection confirms persistent fistulous connection with this ill-defined collection and the residual appendix and cecum. 2. Successful ultrasound-guided paracentesis yielding 2 L of serous ascitic fluid.   Electronically Signed   By: Sandi Mariscal M.D.   On: 07/27/2015 13:19   Ir Paracentesis  07/27/2015   CLINICAL DATA:  This is a patient who is very well known to the interventional radiology service with past medical history significant for malignant GIST with peritoneal carcinomatosis who was subsequently found to have ruptured appendicitis on CT scan performed 05/13/2015 for which the patient underwent a technically successful CT-guided percutaneous drainage catheter placement on 05/13/2015.  Subsequent CT imaging and drainage catheter injections demonstrated resolution of the periappendiceal abscess and the fistulous connection to the residual appendix and as such, the percutaneous drainage catheter was removed on 06/12/2015.  Since that time, the patient has undergone serial ultrasound-guided paracenteses during which time, it was noted the patient was having recurrent apparent purulent appearing discharge from the track of the prior right lower quadrant percutaneous drainage catheter placement.  CT scan of the abdomen and pelvis performed 07/26/2015 demonstrates interval reaccumulation of a tiny periappendiceal abscess as well as a phlegmonous fluid collection within the ventral wall of the right lower abdominal quadrant.  Given the patient's poor operative candidacy, request made  for attempted fluoroscopic guided placement of a  new percutaneous drainage catheter within the right lower abdominal quadrant. Additionally, request made for a repeat ultrasound-guided paracentesis.  EXAM: 1. IR IMAGE GUIDED DRAINAGE PERCUT CATH  PERITONEAL RETROPERIT 2. IR PARACENTESIS  COMPARISON:  Fluoroscopic guided percutaneous drainage catheter injection -06/12/2015; 06/01/2015; CT abdomen pelvis- 07/26/2015; 07/16/2015; 06/08/2015; Ultrasound-guided paracentesis - 07/14/2015; 07/01/2015  CONTRAST:  10 cc Omnipaque 300, administered via the track of the prior right lower quadrant percutaneous drainage catheter  MEDICATIONS: Versed at 1 mg IV; Fentanyl 25 mcg IV.  Sedation time: 20 minutes  FLUOROSCOPY TIME:  1 minutes, 54 seconds (103 mGy).  TECHNIQUE: Informed written consent was obtained from the patient after a discussion of the risks, benefits and alternatives to treatment. Questions regarding the procedure were encouraged and answered. A timeout was performed prior to the initiation of the procedure.  The right lower abdominal quadrant and site of the prior right lower quadrant percutaneous drainage catheter ostomy was prepped and draped in the usual sterile fashion, and a sterile drape was applied covering the operative field. Maximum barrier sterile technique with sterile gowns and gloves were used for the procedure. A timeout was performed prior to the initiation of the procedure. Local anesthesia was provided with 1% lidocaine.  Preprocedural spot fluoroscopic image was obtained of the abdomen with a radiopaque clamp demarcating the location of the access site of the prior percutaneous drainage catheter.  The residual external portion of the right lower quadrant percutaneous drainage catheter was cannulated with a Christmas tree adapter. Contrast injection confirmed patency of the track with persistent fistulous connection with the residual ill-defined fluid collection within the right lower abdominal quadrant and the residual appendix with passage  of contrast through the appendix to the level of the cecum.  With the use of a regular glidewire, a Kumpe the catheter was manipulated through the track to the level of the residual fluid collection with the right lower abdominal quadrant. Contrast injection confirmed appropriate positioning. The surrounding subcutaneous tissues shortness of size with 1% lidocaine. Over a short Amplatz wire, a new 10 French percutaneous drainage catheter was then advanced with and coiled and locked within the residual ill-defined fluid collection with the right lower abdominal quadrant. Contrast injection confirmed appropriate positioning.  External portion of the percutaneous drainage catheter was secured at the skin exit site within interrupted suture. The percutaneous drainage catheter was connected to a gravity bag. A dressing was placed. The patient tolerated the procedure well without immediate postprocedural complication.  Attention was now paid towards performance of the paracentesis.  Initial ultrasound scanning demonstrates a moderate amount of ascites within the right lower abdominal quadrant. The right lower abdomen was prepped and draped in the usual sterile fashion. 1% lidocaine with epinephrine was used for local anesthesia. Under direct ultrasound guidance, a 19 gauge, 7-cm, Yueh catheter was introduced. An ultrasound image was saved for documentation purposed. The paracentesis was performed. The catheter was removed and a dressing was applied.  The patient tolerated both above procedures well without immediate postprocedural complication.  FINDINGS: Contrast injection of the exit site of the prior right lower quadrant percutaneous drainage catheter demonstrates a persistent track to the ill-defined fluid collections seen on preceding abdominal CT as well as a persistent fistulous connection to the residual appendix and cecum.  After successful fluoroscopic guided placement, the new percutaneous drainage catheter is  coiled and locked within the ill-defined fluid collection with the right lower abdominal quadrant.  Ultrasound-guided paracentesis yielded approximately  2 L of serous appearing ascitic fluid.  IMPRESSION: 1. Successful fluoroscopic guided recannulization of the right lower quadrant percutaneous drainage catheter tract with new 10 French percutaneous drainage catheter coiled and locked within the ill-defined residual fluid collection with the right lower abdominal quadrant. Contrast injection confirms persistent fistulous connection with this ill-defined collection and the residual appendix and cecum. 2. Successful ultrasound-guided paracentesis yielding 2 L of serous ascitic fluid.   Electronically Signed   By: Sandi Mariscal M.D.   On: 07/27/2015 13:19    Medications: . amLODipine  5 mg Oral Daily  . imatinib  400 mg Oral Q breakfast  . megestrol  40 mg Oral BID  . metoprolol tartrate  25 mg Oral BID  . mirtazapine  15 mg Oral QHS  . piperacillin-tazobactam (ZOSYN)  IV  3.375 g Intravenous 3 times per day  . saccharomyces boulardii  250 mg Oral BID  . simvastatin  20 mg Oral QPM  . sodium chloride  250 mL Intravenous Once    Assessment/Plan Abdominal pain RLQ, possible appendicitis vs carcinomatosis 5/17-05/20/15. IR percutaneous drain RLQ abscess 05/12/25 - drain removed 06/23/15. IR percutaneous drain RLQ and Paracentesis 2 liters 07/27/15 Hx of GIST tumor with carcinomatosis  S/p multiple paracentesis  Hx of chronic renal disease, stage III - creatinine is up some this AM Anemia EXPLORATORY LAPAROTOMY, OPEN RESECTION OF MESENTERIC AND INTESTINAL MASS; Surgeon: [name], MD, 06/01/13. C diff colitis - flagyl, and rocephin Hx of NSTEMI/CAD Hypertension Hx of Intraperitoneal heomrrhage, Acute respiratory failure, shock, 08/2014 Dyslipidemia Antibiotics: Cipro/Flagy, started 8/7 and converted to Zosyn PM 8/7 - day 2 DVT: SCD/can start Heparin/Lovenox from our standpoint when Medicine  is ready.   PlaN:  He wants to go home, will discuss antibiotic course with everyone today.       LOS: 2 days    Eladia Frame 07/28/2015

## 2015-07-28 NOTE — Progress Notes (Addendum)
[facility] PROGRESS NOTE Assessment/Plan: Sepsis/ Appendiceal abscess: - Started empirically Zosyn, surgery was consulted and recommended IR to place drain 8.9.2016. - KVO IV fluids. - Agree with surgery and I think he need 1-2 more day of IV antibiotics. - The patient should stay at least 1 more additional day for IV antibiotics. I do not feels comfortable discharging the patient on today due to the severity of his illness. I appreciate surgeries assistance. - I have offered him Palliative care but the family has denied it.  CKD (chronic kidney disease), stage III: - At baseline continue to monitor.  Chronic anemia - Mild drop in hemoglobin from 9.5-8.0  - Likely due to chronic infection, they continues to drop my need 1-2 units of packed red blood cells transfused.  Anasarca: Unlikely due to heart failure, he has lower extremity edema with ascites, this likely due to low albumin and possible abscess.  Severe protein caloric malnutrition: Start Ensure 3 times a day.  Code Status: full Family Communication: son  Disposition Plan: home possible in 1-2 days   Consultants:  surgey  Procedures:  CT abd and pelvis  Antibiotics:  Zosyn 8.7.2016  HPI/Subjective: No complains abd pain improved.  Objective: Filed Vitals:   07/27/15 1235 07/27/15 1424 07/27/15 2044 07/28/15 0525  BP: 135/61 119/43 123/42 120/56  Pulse: 76 79 82 71  Temp:  98.4 F (36.9 C) 98.4 F (36.9 C) 97.8 F (36.6 C)  TempSrc:  Oral Oral Oral  Resp: 23 18 18 18   Height:      Weight:      SpO2: 100% 100% 99% 99%    Intake/Output Summary (Last 24 hours) at 07/28/15 1011 Last data filed at 07/28/15 0805  Gross per 24 hour  Intake 537.17 ml  Output    285 ml  Net 252.17 ml   Filed Weights   07/26/15 1900  Weight: 72.576 kg (160 lb)    Exam:  General: Alert, awake, oriented x3, in no acute distress.  HEENT: No bruits, no goiter.  Heart: Regular rate and rhythm. Lungs: Good  air movement, clear Abdomen: Soft, positive bowel sounds.  Neuro: Grossly intact, nonfocal.   Data Reviewed: Basic Metabolic Panel:  Recent Labs Lab 07/26/15 1220 07/26/15 1222 07/26/15 1731 07/27/15 0514 07/28/15 0555  NA 140 140 137 138 139  K 3.6 3.6 3.7 4.0 4.2  CL 111 111 113* 112* 112*  CO2 22 24 22 23 23   GLUCOSE 123* 124* 96 96 108*  BUN 18 18 15 17 19   CREATININE 1.24 1.28* 1.18 1.26* 1.19  CALCIUM 7.9* 7.9* 7.2* 7.7* 7.6*   Liver Function Tests:  Recent Labs Lab 07/26/15 1220 07/26/15 1731  AST 34 27  ALT 37 28  ALKPHOS 129* 100  BILITOT 0.3 0.4  PROT 6.4* 5.3*  ALBUMIN 2.2* 1.9*    Recent Labs Lab 07/26/15 1220  LIPASE 38   No results for input(s): AMMONIA in the last 168 hours. CBC:  Recent Labs Lab 07/26/15 1222 07/26/15 1731 07/27/15 0514 07/28/15 0555  WBC 11.1* 12.5* 8.1 6.3  NEUTROABS 8.9* 10.5*  --   --   HGB 9.5* 8.5* 8.0* 8.1*  HCT 30.4* 26.6* 25.4* 25.3*  MCV 103.1* 101.5* 102.4* 102.4*  PLT 401* 332 303 300   Cardiac Enzymes: No results for input(s): CKTOTAL, CKMB, CKMBINDEX, TROPONINI in the last 168 hours. BNP (last 3 results) No results for input(s): BNP in the last 8760 hours.  ProBNP (last 3 results)  Recent Labs  09/12/14 0445  PROBNP 5726.0*    CBG: No results for input(s): GLUCAP in the last 168 hours.  No results found for this or any previous visit (from the past 240 hour(s)).   Studies: Ct Abdomen Pelvis W Contrast  07/26/2015   CLINICAL DATA:  RIGHT lower quadrant abscess with pus, induration and erythema in RIGHT lower quadrant, recent JP drain removal for appendicitis ; history hypertension, coronary artery disease, stage 3 chronic kidney disease, diastolic CHF, GI stromal tumor with abdominal carcinomatosis  EXAM: CT ABDOMEN AND PELVIS WITH CONTRAST  TECHNIQUE: Multidetector CT imaging of the abdomen and pelvis was performed using the standard protocol following bolus administration of intravenous  contrast. Sagittal and coronal MPR images reconstructed from axial data set.  CONTRAST:  13mL OMNIPAQUE IOHEXOL 300 MG/ML SOLN, 112mL OMNIPAQUE IOHEXOL 300 MG/ML SOLN  COMPARISON:  07/16/2015, 06/08/2015, 05/13/2015  FINDINGS: LEFT pleural effusion and lower lobe atelectasis.  15 mm gallstone in gallbladder.  Liver, spleen, pancreas, kidneys, and adrenal glands normal.  Significant ascites throughout abdomen and pelvis.  RIGHT inguinal hernia containing a small bowel loop without definite evidence of bowel obstruction.  Significant bowel wall thickening of a distal ileal loop in RIGHT pelvis.  Area of diffuse infiltration, fluid, enlargement and poor definition of the lateral RIGHT abdominal wall anterior to the anterior RIGHT iliac bone, could represent a developing abscess though cannot exclude tumor implant along the prior drain tract.  Additional multiloculated collection in the upper RIGHT pelvis at site of large abscess identified on 05/13/2015, now measuring 3.3 x 2.5 cm image 61.  Distal descending and sigmoid diverticulosis.  Unremarkable bladder and ureters.  Small LEFT inguinal hernia containing fat.  No mass, adenopathy, or free air.  Omental implant in the mid abdomen 25 x 13 mm image 32, with scattered additional areas of subtle peritoneal carcinomatosis in the anterior abdomen and mesentery.  Persistent enlargement inflammatory changes of the appendix compatible with appendicitis.  No acute osseous findings.  IMPRESSION: Significant ascites with multiple areas of peritoneal based tumor implants compatible with peritoneal carcinomatosis.  Increased size of an area of infiltrative change at the lateral RIGHT mid abdominal wall anterior to the RIGHT iliac bone, could represent developing inflammatory process/abscess or tumor implantation along prior drainage catheter tract.  Multiloculated collection in upper RIGHT pelvis 3.3 x 2.5 cm at site of previous abscess, question recurrent abscess.  No definite  evidence of bowel obstruction.  Persistent enlargement and inflammation of the appendix as well as edema of a small bowel loop in the RIGHT pelvis.  Decreased LEFT pleural effusion and basilar atelectasis since prior study.   Electronically Signed   By: Lavonia Dana M.D.   On: 07/26/2015 15:55   Ir Image Guided Drainage Percut Cath  Peritoneal Retroperit  07/27/2015   CLINICAL DATA:  This is a patient who is very well known to the interventional radiology service with past medical history significant for malignant GIST with peritoneal carcinomatosis who was subsequently found to have ruptured appendicitis on CT scan performed 05/13/2015 for which the patient underwent a technically successful CT-guided percutaneous drainage catheter placement on 05/13/2015.  Subsequent CT imaging and drainage catheter injections demonstrated resolution of the periappendiceal abscess and the fistulous connection to the residual appendix and as such, the percutaneous drainage catheter was removed on 06/12/2015.  Since that time, the patient has undergone serial ultrasound-guided paracenteses during which time, it was noted the patient was having recurrent apparent purulent appearing discharge from the track of the  prior right lower quadrant percutaneous drainage catheter placement.  CT scan of the abdomen and pelvis performed 07/26/2015 demonstrates interval reaccumulation of a tiny periappendiceal abscess as well as a phlegmonous fluid collection within the ventral wall of the right lower abdominal quadrant.  Given the patient's poor operative candidacy, request made for attempted fluoroscopic guided placement of a new percutaneous drainage catheter within the right lower abdominal quadrant. Additionally, request made for a repeat ultrasound-guided paracentesis.  EXAM: 1. IR IMAGE GUIDED DRAINAGE PERCUT CATH  PERITONEAL RETROPERIT 2. IR PARACENTESIS  COMPARISON:  Fluoroscopic guided percutaneous drainage catheter injection  -06/12/2015; 06/01/2015; CT abdomen pelvis- 07/26/2015; 07/16/2015; 06/08/2015; Ultrasound-guided paracentesis - 07/14/2015; 07/01/2015  CONTRAST:  10 cc Omnipaque 300, administered via the track of the prior right lower quadrant percutaneous drainage catheter  MEDICATIONS: Versed at 1 mg IV; Fentanyl 25 mcg IV.  Sedation time: 20 minutes  FLUOROSCOPY TIME:  1 minutes, 54 seconds (103 mGy).  TECHNIQUE: Informed written consent was obtained from the patient after a discussion of the risks, benefits and alternatives to treatment. Questions regarding the procedure were encouraged and answered. A timeout was performed prior to the initiation of the procedure.  The right lower abdominal quadrant and site of the prior right lower quadrant percutaneous drainage catheter ostomy was prepped and draped in the usual sterile fashion, and a sterile drape was applied covering the operative field. Maximum barrier sterile technique with sterile gowns and gloves were used for the procedure. A timeout was performed prior to the initiation of the procedure. Local anesthesia was provided with 1% lidocaine.  Preprocedural spot fluoroscopic image was obtained of the abdomen with a radiopaque clamp demarcating the location of the access site of the prior percutaneous drainage catheter.  The residual external portion of the right lower quadrant percutaneous drainage catheter was cannulated with a Christmas tree adapter. Contrast injection confirmed patency of the track with persistent fistulous connection with the residual ill-defined fluid collection within the right lower abdominal quadrant and the residual appendix with passage of contrast through the appendix to the level of the cecum.  With the use of a regular glidewire, a Kumpe the catheter was manipulated through the track to the level of the residual fluid collection with the right lower abdominal quadrant. Contrast injection confirmed appropriate positioning. The surrounding  subcutaneous tissues shortness of size with 1% lidocaine. Over a short Amplatz wire, a new 10 French percutaneous drainage catheter was then advanced with and coiled and locked within the residual ill-defined fluid collection with the right lower abdominal quadrant. Contrast injection confirmed appropriate positioning.  External portion of the percutaneous drainage catheter was secured at the skin exit site within interrupted suture. The percutaneous drainage catheter was connected to a gravity bag. A dressing was placed. The patient tolerated the procedure well without immediate postprocedural complication.  Attention was now paid towards performance of the paracentesis.  Initial ultrasound scanning demonstrates a moderate amount of ascites within the right lower abdominal quadrant. The right lower abdomen was prepped and draped in the usual sterile fashion. 1% lidocaine with epinephrine was used for local anesthesia. Under direct ultrasound guidance, a 19 gauge, 7-cm, Yueh catheter was introduced. An ultrasound image was saved for documentation purposed. The paracentesis was performed. The catheter was removed and a dressing was applied.  The patient tolerated both above procedures well without immediate postprocedural complication.  FINDINGS: Contrast injection of the exit site of the prior right lower quadrant percutaneous drainage catheter demonstrates a persistent track to the ill-defined  fluid collections seen on preceding abdominal CT as well as a persistent fistulous connection to the residual appendix and cecum.  After successful fluoroscopic guided placement, the new percutaneous drainage catheter is coiled and locked within the ill-defined fluid collection with the right lower abdominal quadrant.  Ultrasound-guided paracentesis yielded approximately 2 L of serous appearing ascitic fluid.  IMPRESSION: 1. Successful fluoroscopic guided recannulization of the right lower quadrant percutaneous drainage  catheter tract with new 10 French percutaneous drainage catheter coiled and locked within the ill-defined residual fluid collection with the right lower abdominal quadrant. Contrast injection confirms persistent fistulous connection with this ill-defined collection and the residual appendix and cecum. 2. Successful ultrasound-guided paracentesis yielding 2 L of serous ascitic fluid.   Electronically Signed   By: Sandi Mariscal M.D.   On: 07/27/2015 13:19   Ir Paracentesis  07/27/2015   CLINICAL DATA:  This is a patient who is very well known to the interventional radiology service with past medical history significant for malignant GIST with peritoneal carcinomatosis who was subsequently found to have ruptured appendicitis on CT scan performed 05/13/2015 for which the patient underwent a technically successful CT-guided percutaneous drainage catheter placement on 05/13/2015.  Subsequent CT imaging and drainage catheter injections demonstrated resolution of the periappendiceal abscess and the fistulous connection to the residual appendix and as such, the percutaneous drainage catheter was removed on 06/12/2015.  Since that time, the patient has undergone serial ultrasound-guided paracenteses during which time, it was noted the patient was having recurrent apparent purulent appearing discharge from the track of the prior right lower quadrant percutaneous drainage catheter placement.  CT scan of the abdomen and pelvis performed 07/26/2015 demonstrates interval reaccumulation of a tiny periappendiceal abscess as well as a phlegmonous fluid collection within the ventral wall of the right lower abdominal quadrant.  Given the patient's poor operative candidacy, request made for attempted fluoroscopic guided placement of a new percutaneous drainage catheter within the right lower abdominal quadrant. Additionally, request made for a repeat ultrasound-guided paracentesis.  EXAM: 1. IR IMAGE GUIDED DRAINAGE PERCUT CATH   PERITONEAL RETROPERIT 2. IR PARACENTESIS  COMPARISON:  Fluoroscopic guided percutaneous drainage catheter injection -06/12/2015; 06/01/2015; CT abdomen pelvis- 07/26/2015; 07/16/2015; 06/08/2015; Ultrasound-guided paracentesis - 07/14/2015; 07/01/2015  CONTRAST:  10 cc Omnipaque 300, administered via the track of the prior right lower quadrant percutaneous drainage catheter  MEDICATIONS: Versed at 1 mg IV; Fentanyl 25 mcg IV.  Sedation time: 20 minutes  FLUOROSCOPY TIME:  1 minutes, 54 seconds (103 mGy).  TECHNIQUE: Informed written consent was obtained from the patient after a discussion of the risks, benefits and alternatives to treatment. Questions regarding the procedure were encouraged and answered. A timeout was performed prior to the initiation of the procedure.  The right lower abdominal quadrant and site of the prior right lower quadrant percutaneous drainage catheter ostomy was prepped and draped in the usual sterile fashion, and a sterile drape was applied covering the operative field. Maximum barrier sterile technique with sterile gowns and gloves were used for the procedure. A timeout was performed prior to the initiation of the procedure. Local anesthesia was provided with 1% lidocaine.  Preprocedural spot fluoroscopic image was obtained of the abdomen with a radiopaque clamp demarcating the location of the access site of the prior percutaneous drainage catheter.  The residual external portion of the right lower quadrant percutaneous drainage catheter was cannulated with a Christmas tree adapter. Contrast injection confirmed patency of the track with persistent fistulous connection with the residual ill-defined  fluid collection within the right lower abdominal quadrant and the residual appendix with passage of contrast through the appendix to the level of the cecum.  With the use of a regular glidewire, a Kumpe the catheter was manipulated through the track to the level of the residual fluid collection  with the right lower abdominal quadrant. Contrast injection confirmed appropriate positioning. The surrounding subcutaneous tissues shortness of size with 1% lidocaine. Over a short Amplatz wire, a new 10 French percutaneous drainage catheter was then advanced with and coiled and locked within the residual ill-defined fluid collection with the right lower abdominal quadrant. Contrast injection confirmed appropriate positioning.  External portion of the percutaneous drainage catheter was secured at the skin exit site within interrupted suture. The percutaneous drainage catheter was connected to a gravity bag. A dressing was placed. The patient tolerated the procedure well without immediate postprocedural complication.  Attention was now paid towards performance of the paracentesis.  Initial ultrasound scanning demonstrates a moderate amount of ascites within the right lower abdominal quadrant. The right lower abdomen was prepped and draped in the usual sterile fashion. 1% lidocaine with epinephrine was used for local anesthesia. Under direct ultrasound guidance, a 19 gauge, 7-cm, Yueh catheter was introduced. An ultrasound image was saved for documentation purposed. The paracentesis was performed. The catheter was removed and a dressing was applied.  The patient tolerated both above procedures well without immediate postprocedural complication.  FINDINGS: Contrast injection of the exit site of the prior right lower quadrant percutaneous drainage catheter demonstrates a persistent track to the ill-defined fluid collections seen on preceding abdominal CT as well as a persistent fistulous connection to the residual appendix and cecum.  After successful fluoroscopic guided placement, the new percutaneous drainage catheter is coiled and locked within the ill-defined fluid collection with the right lower abdominal quadrant.  Ultrasound-guided paracentesis yielded approximately 2 L of serous appearing ascitic fluid.   IMPRESSION: 1. Successful fluoroscopic guided recannulization of the right lower quadrant percutaneous drainage catheter tract with new 10 French percutaneous drainage catheter coiled and locked within the ill-defined residual fluid collection with the right lower abdominal quadrant. Contrast injection confirms persistent fistulous connection with this ill-defined collection and the residual appendix and cecum. 2. Successful ultrasound-guided paracentesis yielding 2 L of serous ascitic fluid.   Electronically Signed   By: Sandi Mariscal M.D.   On: 07/27/2015 13:19    Scheduled Meds: . amLODipine  5 mg Oral Daily  . imatinib  400 mg Oral Q breakfast  . megestrol  40 mg Oral BID  . metoprolol tartrate  25 mg Oral BID  . mirtazapine  15 mg Oral QHS  . piperacillin-tazobactam (ZOSYN)  IV  3.375 g Intravenous 3 times per day  . saccharomyces boulardii  250 mg Oral BID  . simvastatin  20 mg Oral QPM  . sodium chloride  250 mL Intravenous Once   Continuous Infusions: . sodium chloride 10 mL/hr at 07/27/15 1347    Time Spent: 25 min   Charlynne Cousins  [facility] Pager [phone]. If 7PM-7AM, please contact night-coverage at [email or web address], password [facility] 07/28/2015, 10:11 AM  LOS: 2 days

## 2015-07-29 ENCOUNTER — Other Ambulatory Visit: Payer: Medicare Other

## 2015-07-29 ENCOUNTER — Ambulatory Visit: Payer: Medicare Other | Admitting: Nurse Practitioner

## 2015-07-29 ENCOUNTER — Other Ambulatory Visit: Payer: Self-pay | Admitting: Radiology

## 2015-07-29 ENCOUNTER — Other Ambulatory Visit: Payer: Self-pay | Admitting: *Deleted

## 2015-07-29 DIAGNOSIS — C179 Malignant neoplasm of small intestine, unspecified: Secondary | ICD-10-CM

## 2015-07-29 DIAGNOSIS — R188 Other ascites: Secondary | ICD-10-CM

## 2015-07-29 DIAGNOSIS — K651 Peritoneal abscess: Secondary | ICD-10-CM

## 2015-07-29 DIAGNOSIS — A419 Sepsis, unspecified organism: Principal | ICD-10-CM

## 2015-07-29 MED ORDER — SACCHAROMYCES BOULARDII 250 MG PO CAPS: 250.0000 mg | ORAL_CAPSULE | Freq: Two times a day (BID) | ORAL | Status: DC

## 2015-07-29 MED ORDER — HYDROCODONE-ACETAMINOPHEN 5-325 MG PO TABS: 1.0000 | ORAL_TABLET | ORAL | Status: DC | PRN

## 2015-07-29 NOTE — Discharge Summary (Addendum)
Physician Discharge Summary  [name] [name] HWE:[id number] DOB: 1929-01-06 DOA: 07/26/2015  PCP: [name] Miyamoto, MD  Admit date: 07/26/2015 Discharge date: 07/29/2015  Time spent: 50 minutes  Recommendations for Outpatient Follow-up:  1. 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 He was treated recently for possible appendicitis with an abdominal drain Zosyn which is managed by [facility] surgery. On 5/22 he developed C. difficile colitis and was treated with Flagyl. Drain was removed and he was without a drain for about a month but stated that the opening where the drain was removed from continued to drain. He came into the ER as drainage became malodorous and he began having abdominal pain about a day prior to coming in. CT scan of the abdomen and pelvis revealed increased area of infiltrative changes in the lateral right mid abdominal wall anterior to the right iliac bone. He was placed on IV antibiotics for treatment of recurrent abdominal abscess. He is also been undergoing intermittent paracentesis for ascites  Hospital Course:  Abdominal abscess likely secondary to appendicitis -Surgical consult was requested-Dr. Donne Hazel suspect that the abscesses secondary to appendicitis. He requested a drain be placed which was done by IR-he was maintained on IV antibiotics as she was given ciprofloxacin and Flagyl initially which were switched to Zosyn - blood cultures negative-abdominal fluid not sent for culture-previous abscess culture on 5/28 revealed multiple organisms-none predominant -The patient's  son insisted he be discharged yesterday but surgery recommended at least 2 more days of IV antibiotics and subsequently transitioning to Augmentin for 2 more weeks -The patient's son is insisting again that his father be discharged today, insisting that he spoke with the surgeon yesterday and was told it was okay to be discharged-he states "I am taking him home" and is asking for prescription for Augmentin which was recommended by surgery  -I will be discharging him-prescription for Augmentin already written by surgery -Patient has had a drain before and knows how to flush it-basic instructions given -He knows to follow-up with the drain clinic and with general surgery-numbers given in discharge papers  Recent C. difficile colitis -Monitor closely for recurrence --Continue Florastor  Gist tumor -cont Gleevac- Dr [name] [name] aware of this admission   Ascites -Secondary to above tumor-she has been undergoing periodic paracentesis at outpatient -Underwent paracentesis with 2 L of fluid removed by IR on 8/8  CKD 3 -Stable  Procedures:  IR guided paracentesis and intra-abdominal drain placement 8/8  Consultations:  General surgery  Oncology  IR  Discharge Exam: Filed Weights   07/26/15 1900  Weight: 72.576 kg (160 lb)   Filed Vitals:   07/29/15 0521  BP: 121/58  Pulse: 73  Temp: 98 F (36.7 C)  Resp: 20    General: AAO x 3, no distress Cardiovascular: RRR, no murmurs  Respiratory: clear to auscultation bilaterally GI: soft, non-tender, non-distended, bowel sound positive-right lower quadrant drain in place with small amount of yellowish brown fluid and bag  Discharge Instructions You were cared for by a hospitalist during your hospital stay. If you have any questions about your discharge medications or the  care you received while you were in the hospital after you are discharged, you can call the unit and asked to speak with the hospitalist on call if the hospitalist that  took care of you is not available. Once you are discharged, your primary care physician will handle any further medical issues. Please note that NO REFILLS for any discharge medications will be authorized once you are discharged, as it is imperative that you return to your primary care physician (or establish a relationship with a primary care physician if you do not have one) for your aftercare needs so that they can reassess your need for medications and monitor your lab values.      Discharge Instructions    Diet - low sodium heart healthy    Complete by:  As directed      Discharge instructions    Complete by:  As directed   Flush drain three times a day with 5 cc of normal saline     Increase activity slowly    Complete by:  As directed             Medication List    TAKE these medications        amLODipine 5 MG tablet  Commonly known as:  NORVASC  Take 5 mg by mouth daily.     amoxicillin-clavulanate 875-125 MG per tablet  Commonly known as:  AUGMENTIN  Take 1 tablet by mouth 2 (two) times daily.     furosemide 20 MG tablet  Commonly known as:  LASIX  Take 1 tablet (20 mg total) by mouth daily.     HYDROcodone-acetaminophen 5-325 MG per tablet  Commonly known as:  NORCO/VICODIN  Take 1 tablet by mouth every 4 (four) hours as needed for moderate pain.     imatinib 400 MG tablet  Commonly known as:  GLEEVEC  Take 1 tablet (400 mg total) by mouth daily. Take with meals and large glass of water.Caution:Chemotherapy.     megestrol 40 MG/ML suspension  Commonly known as:  MEGACE  Take 40 mg by mouth 2 (two) times daily.     metoprolol tartrate 25 MG tablet  Commonly known as:  LOPRESSOR  Take 1 tablet (25 mg total) by mouth 2 (two) times daily.     mirtazapine 15 MG tablet  Commonly known as:  REMERON  Take 15 mg by mouth at bedtime.     saccharomyces boulardii 250 MG capsule  Commonly known as:  FLORASTOR  Take 1 capsule (250 mg total) by mouth 2 (two) times  daily.     simvastatin 20 MG tablet  Commonly known as:  ZOCOR  Take 20 mg by mouth every evening.       No Known Allergies Follow-up Information    Follow up with [name], MD. Schedule an appointment as soon as possible for a visit on 08/03/2015.   Specialty:  General Surgery   Why:  Please follow up with Dr. [name] on Monday, August 15th at 4:30pm   Contact information:   [address] [place] [address] [phone]       Call HOSS, ART A, MD.   Specialty:  Interventional Radiology   Why:  Dr. Jonelle Sports office will call you to set up an appointment in [facility] to evaluate whether your drain can come out.     Contact information:   [address] [place] [address] [phone]  The results of significant diagnostics from this hospitalization (including imaging, microbiology, ancillary and laboratory) are listed below for reference.    Significant Diagnostic Studies: Ct Abdomen Pelvis W Contrast  07/26/2015   CLINICAL DATA:  RIGHT lower quadrant abscess with pus, induration and erythema in RIGHT lower quadrant, recent JP drain removal for appendicitis ; history hypertension, coronary artery disease, stage 3 chronic kidney disease, diastolic CHF, GI stromal tumor with abdominal carcinomatosis  EXAM: CT ABDOMEN AND PELVIS WITH CONTRAST  TECHNIQUE: Multidetector CT imaging of the abdomen and pelvis was performed using the standard protocol following bolus administration of intravenous contrast. Sagittal and coronal MPR images reconstructed from axial data set.  CONTRAST:  64mL OMNIPAQUE IOHEXOL 300 MG/ML SOLN, 177mL OMNIPAQUE IOHEXOL 300 MG/ML SOLN  COMPARISON:  07/16/2015, 06/08/2015, 05/13/2015  FINDINGS: LEFT pleural effusion and lower lobe atelectasis.  15 mm gallstone in gallbladder.  Liver, spleen, pancreas, kidneys, and adrenal glands normal.  Significant ascites throughout abdomen and pelvis.  RIGHT inguinal hernia containing a small  bowel loop without definite evidence of bowel obstruction.  Significant bowel wall thickening of a distal ileal loop in RIGHT pelvis.  Area of diffuse infiltration, fluid, enlargement and poor definition of the lateral RIGHT abdominal wall anterior to the anterior RIGHT iliac bone, could represent a developing abscess though cannot exclude tumor implant along the prior drain tract.  Additional multiloculated collection in the upper RIGHT pelvis at site of large abscess identified on 05/13/2015, now measuring 3.3 x 2.5 cm image 61.  Distal descending and sigmoid diverticulosis.  Unremarkable bladder and ureters.  Small LEFT inguinal hernia containing fat.  No mass, adenopathy, or free air.  Omental implant in the mid abdomen 25 x 13 mm image 32, with scattered additional areas of subtle peritoneal carcinomatosis in the anterior abdomen and mesentery.  Persistent enlargement inflammatory changes of the appendix compatible with appendicitis.  No acute osseous findings.  IMPRESSION: Significant ascites with multiple areas of peritoneal based tumor implants compatible with peritoneal carcinomatosis.  Increased size of an area of infiltrative change at the lateral RIGHT mid abdominal wall anterior to the RIGHT iliac bone, could represent developing inflammatory process/abscess or tumor implantation along prior drainage catheter tract.  Multiloculated collection in upper RIGHT pelvis 3.3 x 2.5 cm at site of previous abscess, question recurrent abscess.  No definite evidence of bowel obstruction.  Persistent enlargement and inflammation of the appendix as well as edema of a small bowel loop in the RIGHT pelvis.  Decreased LEFT pleural effusion and basilar atelectasis since prior study.   Electronically Signed   By: Lavonia Dana M.D.   On: 07/26/2015 15:55   Ct Abdomen Pelvis W Contrast  07/16/2015   CLINICAL DATA:  Followup appendix abscess, drain has been removed, appendix still in place, history of gastrointestinal  stromal tumor with metastasis October 2015 status post chemotherapy, currently taking Gleevec  EXAM: CT ABDOMEN AND PELVIS WITH CONTRAST  TECHNIQUE: Multidetector CT imaging of the abdomen and pelvis was performed using the standard protocol following bolus administration of intravenous contrast.  CONTRAST:  60mL ISOVUE-300 IOPAMIDOL (ISOVUE-300) INJECTION 61%  COMPARISON:  06/08/2015  FINDINGS: Lower chest: Increase in the size of a now moderate to large left pleural effusion, with underlying compressive atelectasis. 4 mm pulmonary nodule right lung base image number 8 stable.  Hepatobiliary: No focal hepatic abnormalities. Gallbladder is contracted. Increased attenuation inferiorly within the gallbladder suggests possibility of cholelithiasis as previously described.  Pancreas: Normal  Spleen: Normal  Adrenals/Urinary Tract: Normal  adrenal glands.  Kidneys are normal.  Stomach/Bowel: Small bowel anastomosis anteriorly in the midline as seen on prior study. No abnormally dilated loops of bowel to suggest obstruction. Anastomosis remains adjacent to the anterior abdominal wall and adhesions may be present. Diverticulosis distal colon. The appendix measures 17 mm in diameter with wall thickening.  Vascular/Lymphatic: Atherosclerotic calcification of the aortoiliac vessels. 1 cm right iliac adenopathy image number 79 as previously described possibly representing metastasis.  Reproductive: Small bowel and fluid again seen into a right inguinal hernia with no evidence of obstruction or strangulation.  Other: There is a small volume of ascites present, decreased when compared to prior study with reported intervening paracentesis. Increased attenuation within the ascites anterior left abdomen upper quadrant and right upper quadrant consistent with peritoneal metastasis, stable. Peritoneal thickening within the right inguinal canal suggests metastasis as well. No recurrent abscess in the right lower quadrant status post  removal of percutaneous catheter.  Musculoskeletal: No acute findings  IMPRESSION: 1. Increased pleural effusion on the left 2. Status post paracentesis decreased ascites. Persistent evidence of peritoneal metastasis. 3. Abnormally dilated appendix with wall thickening consistent with known inflammation. No abscess status post removal of drainage catheter. 4. Right inguinal hernia with evidence of involvement with metastasis 5. Right common iliac adenopathy concerning for metastasis 6. Other nonacute findings described above   Electronically Signed   By: Skipper Cliche M.D.   On: 07/16/2015 16:26   US Paracentesis  07/14/2015   CLINICAL DATA:  Ascites secondary to gastrointestinal stromal tumor  EXAM: ULTRASOUND GUIDED LEFT LOWER QUADRANT PARACENTESIS  COMPARISON:  None.  PROCEDURE: An ultrasound guided paracentesis was thoroughly discussed with the patient and questions answered. The benefits, risks, alternatives and complications were also discussed. The patient understands and wishes to proceed with the procedure. Written consent was obtained.  Ultrasound was performed to localize and mark an adequate pocket of fluid in the left lower quadrant of the abdomen. The area was then prepped and draped in the normal sterile fashion. 1% Lidocaine was used for local anesthesia. Under ultrasound guidance a 19 gauge Yueh catheter was introduced. Paracentesis was performed. The catheter was removed and a dressing applied.  COMPLICATIONS: None.  FINDINGS: A total of approximately 3.1 liters of clear yellow fluid was removed. A fluid sample was not sent for laboratory analysis.  IMPRESSION: Successful ultrasound guided paracentesis yielding 3.1 liters of ascites.  Read by:  Gareth Eagle, PA-C   Electronically Signed   By: Sandi Mariscal M.D.   On: 07/14/2015 13:22   US Paracentesis  07/01/2015   INDICATION: Patient with history of gastrointestinal stromal tumor of small bowel, abdominal carcinomatosis, previously drained  appendiceal abscess, ascites. Request is made for diagnostic and therapeutic paracentesis.  EXAM: ULTRASOUND-GUIDED DIAGNOSTIC AND THERAPEUTIC PARACENTESIS  COMPARISON:  Prior paracentesis on 06/19/2015  MEDICATIONS: None.  COMPLICATIONS: None immediate  TECHNIQUE: Informed written consent was obtained from the patient after a discussion of the risks, benefits and alternatives to treatment. A timeout was performed prior to the initiation of the procedure.  Initial ultrasound scanning demonstrates a large amount of ascites within the left lower abdominal quadrant. The left lower abdomen was prepped and draped in the usual sterile fashion. 1% lidocaine was used for local anesthesia. Under direct ultrasound guidance, a 19 gauge, 10-cm, Yueh catheter was introduced. An ultrasound image was saved for documentation purposed. The paracentesis was performed. The catheter was removed and a dressing was applied. The patient tolerated the procedure well without immediate post  procedural complication.  FINDINGS: A total of approximately 5.3 liters of yellow fluid was removed. Samples were sent to the laboratory as requested by the clinical team.  IMPRESSION: Successful ultrasound-guided diagnostic and therapeutic paracentesis yielding 5.3 liters of peritoneal fluid.  Read by: Rowe Robert, PA-C   Electronically Signed   By: Sandi Mariscal M.D.   On: 07/01/2015 15:48   Ir Image Guided Drainage Percut Cath  Peritoneal Retroperit  07/27/2015   CLINICAL DATA:  This is a patient who is very well known to the interventional radiology service with past medical history significant for malignant GIST with peritoneal carcinomatosis who was subsequently found to have ruptured appendicitis on CT scan performed 05/13/2015 for which the patient underwent a technically successful CT-guided percutaneous drainage catheter placement on 05/13/2015.  Subsequent CT imaging and drainage catheter injections demonstrated resolution of the  periappendiceal abscess and the fistulous connection to the residual appendix and as such, the percutaneous drainage catheter was removed on 06/12/2015.  Since that time, the patient has undergone serial ultrasound-guided paracenteses during which time, it was noted the patient was having recurrent apparent purulent appearing discharge from the track of the prior right lower quadrant percutaneous drainage catheter placement.  CT scan of the abdomen and pelvis performed 07/26/2015 demonstrates interval reaccumulation of a tiny periappendiceal abscess as well as a phlegmonous fluid collection within the ventral wall of the right lower abdominal quadrant.  Given the patient's poor operative candidacy, request made for attempted fluoroscopic guided placement of a new percutaneous drainage catheter within the right lower abdominal quadrant. Additionally, request made for a repeat ultrasound-guided paracentesis.  EXAM: 1. IR IMAGE GUIDED DRAINAGE PERCUT CATH  PERITONEAL RETROPERIT 2. IR PARACENTESIS  COMPARISON:  Fluoroscopic guided percutaneous drainage catheter injection -06/12/2015; 06/01/2015; CT abdomen pelvis- 07/26/2015; 07/16/2015; 06/08/2015; Ultrasound-guided paracentesis - 07/14/2015; 07/01/2015  CONTRAST:  10 cc Omnipaque 300, administered via the track of the prior right lower quadrant percutaneous drainage catheter  MEDICATIONS: Versed at 1 mg IV; Fentanyl 25 mcg IV.  Sedation time: 20 minutes  FLUOROSCOPY TIME:  1 minutes, 54 seconds (103 mGy).  TECHNIQUE: Informed written consent was obtained from the patient after a discussion of the risks, benefits and alternatives to treatment. Questions regarding the procedure were encouraged and answered. A timeout was performed prior to the initiation of the procedure.  The right lower abdominal quadrant and site of the prior right lower quadrant percutaneous drainage catheter ostomy was prepped and draped in the usual sterile fashion, and a sterile drape was applied  covering the operative field. Maximum barrier sterile technique with sterile gowns and gloves were used for the procedure. A timeout was performed prior to the initiation of the procedure. Local anesthesia was provided with 1% lidocaine.  Preprocedural spot fluoroscopic image was obtained of the abdomen with a radiopaque clamp demarcating the location of the access site of the prior percutaneous drainage catheter.  The residual external portion of the right lower quadrant percutaneous drainage catheter was cannulated with a Christmas tree adapter. Contrast injection confirmed patency of the track with persistent fistulous connection with the residual ill-defined fluid collection within the right lower abdominal quadrant and the residual appendix with passage of contrast through the appendix to the level of the cecum.  With the use of a regular glidewire, a Kumpe the catheter was manipulated through the track to the level of the residual fluid collection with the right lower abdominal quadrant. Contrast injection confirmed appropriate positioning. The surrounding subcutaneous tissues shortness of size with  1% lidocaine. Over a short Amplatz wire, a new 10 French percutaneous drainage catheter was then advanced with and coiled and locked within the residual ill-defined fluid collection with the right lower abdominal quadrant. Contrast injection confirmed appropriate positioning.  External portion of the percutaneous drainage catheter was secured at the skin exit site within interrupted suture. The percutaneous drainage catheter was connected to a gravity bag. A dressing was placed. The patient tolerated the procedure well without immediate postprocedural complication.  Attention was now paid towards performance of the paracentesis.  Initial ultrasound scanning demonstrates a moderate amount of ascites within the right lower abdominal quadrant. The right lower abdomen was prepped and draped in the usual sterile  fashion. 1% lidocaine with epinephrine was used for local anesthesia. Under direct ultrasound guidance, a 19 gauge, 7-cm, Yueh catheter was introduced. An ultrasound image was saved for documentation purposed. The paracentesis was performed. The catheter was removed and a dressing was applied.  The patient tolerated both above procedures well without immediate postprocedural complication.  FINDINGS: Contrast injection of the exit site of the prior right lower quadrant percutaneous drainage catheter demonstrates a persistent track to the ill-defined fluid collections seen on preceding abdominal CT as well as a persistent fistulous connection to the residual appendix and cecum.  After successful fluoroscopic guided placement, the new percutaneous drainage catheter is coiled and locked within the ill-defined fluid collection with the right lower abdominal quadrant.  Ultrasound-guided paracentesis yielded approximately 2 L of serous appearing ascitic fluid.  IMPRESSION: 1. Successful fluoroscopic guided recannulization of the right lower quadrant percutaneous drainage catheter tract with new 10 French percutaneous drainage catheter coiled and locked within the ill-defined residual fluid collection with the right lower abdominal quadrant. Contrast injection confirms persistent fistulous connection with this ill-defined collection and the residual appendix and cecum. 2. Successful ultrasound-guided paracentesis yielding 2 L of serous ascitic fluid.   Electronically Signed   By: Sandi Mariscal M.D.   On: 07/27/2015 13:19   Ir Paracentesis  07/27/2015   CLINICAL DATA:  This is a patient who is very well known to the interventional radiology service with past medical history significant for malignant GIST with peritoneal carcinomatosis who was subsequently found to have ruptured appendicitis on CT scan performed 05/13/2015 for which the patient underwent a technically successful CT-guided percutaneous drainage catheter  placement on 05/13/2015.  Subsequent CT imaging and drainage catheter injections demonstrated resolution of the periappendiceal abscess and the fistulous connection to the residual appendix and as such, the percutaneous drainage catheter was removed on 06/12/2015.  Since that time, the patient has undergone serial ultrasound-guided paracenteses during which time, it was noted the patient was having recurrent apparent purulent appearing discharge from the track of the prior right lower quadrant percutaneous drainage catheter placement.  CT scan of the abdomen and pelvis performed 07/26/2015 demonstrates interval reaccumulation of a tiny periappendiceal abscess as well as a phlegmonous fluid collection within the ventral wall of the right lower abdominal quadrant.  Given the patient's poor operative candidacy, request made for attempted fluoroscopic guided placement of a new percutaneous drainage catheter within the right lower abdominal quadrant. Additionally, request made for a repeat ultrasound-guided paracentesis.  EXAM: 1. IR IMAGE GUIDED DRAINAGE PERCUT CATH  PERITONEAL RETROPERIT 2. IR PARACENTESIS  COMPARISON:  Fluoroscopic guided percutaneous drainage catheter injection -06/12/2015; 06/01/2015; CT abdomen pelvis- 07/26/2015; 07/16/2015; 06/08/2015; Ultrasound-guided paracentesis - 07/14/2015; 07/01/2015  CONTRAST:  10 cc Omnipaque 300, administered via the track of the prior right lower quadrant percutaneous drainage  catheter  MEDICATIONS: Versed at 1 mg IV; Fentanyl 25 mcg IV.  Sedation time: 20 minutes  FLUOROSCOPY TIME:  1 minutes, 54 seconds (103 mGy).  TECHNIQUE: Informed written consent was obtained from the patient after a discussion of the risks, benefits and alternatives to treatment. Questions regarding the procedure were encouraged and answered. A timeout was performed prior to the initiation of the procedure.  The right lower abdominal quadrant and site of the prior right lower quadrant  percutaneous drainage catheter ostomy was prepped and draped in the usual sterile fashion, and a sterile drape was applied covering the operative field. Maximum barrier sterile technique with sterile gowns and gloves were used for the procedure. A timeout was performed prior to the initiation of the procedure. Local anesthesia was provided with 1% lidocaine.  Preprocedural spot fluoroscopic image was obtained of the abdomen with a radiopaque clamp demarcating the location of the access site of the prior percutaneous drainage catheter.  The residual external portion of the right lower quadrant percutaneous drainage catheter was cannulated with a Christmas tree adapter. Contrast injection confirmed patency of the track with persistent fistulous connection with the residual ill-defined fluid collection within the right lower abdominal quadrant and the residual appendix with passage of contrast through the appendix to the level of the cecum.  With the use of a regular glidewire, a Kumpe the catheter was manipulated through the track to the level of the residual fluid collection with the right lower abdominal quadrant. Contrast injection confirmed appropriate positioning. The surrounding subcutaneous tissues shortness of size with 1% lidocaine. Over a short Amplatz wire, a new 10 French percutaneous drainage catheter was then advanced with and coiled and locked within the residual ill-defined fluid collection with the right lower abdominal quadrant. Contrast injection confirmed appropriate positioning.  External portion of the percutaneous drainage catheter was secured at the skin exit site within interrupted suture. The percutaneous drainage catheter was connected to a gravity bag. A dressing was placed. The patient tolerated the procedure well without immediate postprocedural complication.  Attention was now paid towards performance of the paracentesis.  Initial ultrasound scanning demonstrates a moderate amount of  ascites within the right lower abdominal quadrant. The right lower abdomen was prepped and draped in the usual sterile fashion. 1% lidocaine with epinephrine was used for local anesthesia. Under direct ultrasound guidance, a 19 gauge, 7-cm, Yueh catheter was introduced. An ultrasound image was saved for documentation purposed. The paracentesis was performed. The catheter was removed and a dressing was applied.  The patient tolerated both above procedures well without immediate postprocedural complication.  FINDINGS: Contrast injection of the exit site of the prior right lower quadrant percutaneous drainage catheter demonstrates a persistent track to the ill-defined fluid collections seen on preceding abdominal CT as well as a persistent fistulous connection to the residual appendix and cecum.  After successful fluoroscopic guided placement, the new percutaneous drainage catheter is coiled and locked within the ill-defined fluid collection with the right lower abdominal quadrant.  Ultrasound-guided paracentesis yielded approximately 2 L of serous appearing ascitic fluid.  IMPRESSION: 1. Successful fluoroscopic guided recannulization of the right lower quadrant percutaneous drainage catheter tract with new 10 French percutaneous drainage catheter coiled and locked within the ill-defined residual fluid collection with the right lower abdominal quadrant. Contrast injection confirms persistent fistulous connection with this ill-defined collection and the residual appendix and cecum. 2. Successful ultrasound-guided paracentesis yielding 2 L of serous ascitic fluid.   Electronically Signed   By: Sandi Mariscal  M.D.   On: 07/27/2015 13:19    Microbiology: Recent Results (from the past 240 hour(s))  Culture, blood (x 2)     Status: None (Preliminary result)   Collection Time: 07/26/15  6:10 PM  Result Value Ref Range Status   Specimen Description BLOOD LEFT ARM  Final   Special Requests   Final    BOTTLES DRAWN  AEROBIC AND ANAEROBIC BLUE 10CC RED [place]   Culture   Final    NO GROWTH 1 DAY Performed at [facility]    Report Status PENDING  Incomplete  Culture, blood (x 2)     Status: None (Preliminary result)   Collection Time: 07/26/15  6:22 PM  Result Value Ref Range Status   Specimen Description BLOOD LEFT HAND  Final   Special Requests   Final    BOTTLES DRAWN AEROBIC AND ANAEROBIC BLUE 10CC RED [place]   Culture   Final    NO GROWTH 1 DAY Performed at [facility]    Report Status PENDING  Incomplete     Labs: Basic Metabolic Panel:  Recent Labs Lab 07/26/15 1220 07/26/15 1222 07/26/15 1731 07/27/15 0514 07/28/15 0555  NA 140 140 137 138 139  K 3.6 3.6 3.7 4.0 4.2  CL 111 111 113* 112* 112*  CO2 22 24 22 23 23   GLUCOSE 123* 124* 96 96 108*  BUN 18 18 15 17 19   CREATININE 1.24 1.28* 1.18 1.26* 1.19  CALCIUM 7.9* 7.9* 7.2* 7.7* 7.6*   Liver Function Tests:  Recent Labs Lab 07/26/15 1220 07/26/15 1731  AST 34 27  ALT 37 28  ALKPHOS 129* 100  BILITOT 0.3 0.4  PROT 6.4* 5.3*  ALBUMIN 2.2* 1.9*    Recent Labs Lab 07/26/15 1220  LIPASE 38   No results for input(s): AMMONIA in the last 168 hours. CBC:  Recent Labs Lab 07/26/15 1222 07/26/15 1731 07/27/15 0514 07/28/15 0555  WBC 11.1* 12.5* 8.1 6.3  NEUTROABS 8.9* 10.5*  --   --   HGB 9.5* 8.5* 8.0* 8.1*  HCT 30.4* 26.6* 25.4* 25.3*  MCV 103.1* 101.5* 102.4* 102.4*  PLT 401* 332 303 300   Cardiac Enzymes: No results for input(s): CKTOTAL, CKMB, CKMBINDEX, TROPONINI in the last 168 hours. BNP: BNP (last 3 results) No results for input(s): BNP in the last 8760 hours.  ProBNP (last 3 results)  Recent Labs  09/12/14 0445  PROBNP 5726.0*    CBG: No results for input(s): GLUCAP in the last 168 hours.     SignedDebbe Odea, MD [facility] 07/29/2015, 11:44 AM

## 2015-07-29 NOTE — Progress Notes (Signed)
Ambulatory pulse ox- 95% on room air. Will continue to monitor.

## 2015-07-29 NOTE — Progress Notes (Signed)
Discharge instructions given to pt/family, verbalized understanding. Left the unit in stable condition. 

## 2015-07-30 ENCOUNTER — Telehealth: Payer: Self-pay | Admitting: Oncology

## 2015-07-30 NOTE — Telephone Encounter (Signed)
Spoke with patient and he is aware of his appointment 8/29

## 2015-08-01 LAB — CULTURE, BLOOD (ROUTINE X 2)
Culture: NO GROWTH
Culture: NO GROWTH

## 2015-08-03 DIAGNOSIS — K651 Peritoneal abscess: Secondary | ICD-10-CM | POA: Diagnosis not present

## 2015-08-03 DIAGNOSIS — Z8619 Personal history of other infectious and parasitic diseases: Secondary | ICD-10-CM | POA: Diagnosis not present

## 2015-08-03 DIAGNOSIS — Z8509 Personal history of malignant neoplasm of other digestive organs: Secondary | ICD-10-CM | POA: Diagnosis not present

## 2015-08-03 DIAGNOSIS — R188 Other ascites: Secondary | ICD-10-CM | POA: Diagnosis not present

## 2015-08-03 DIAGNOSIS — K4090 Unilateral inguinal hernia, without obstruction or gangrene, not specified as recurrent: Secondary | ICD-10-CM | POA: Diagnosis not present

## 2015-08-03 DIAGNOSIS — I2510 Atherosclerotic heart disease of native coronary artery without angina pectoris: Secondary | ICD-10-CM | POA: Diagnosis not present

## 2015-08-03 DIAGNOSIS — K432 Incisional hernia without obstruction or gangrene: Secondary | ICD-10-CM | POA: Diagnosis not present

## 2015-08-05 ENCOUNTER — Ambulatory Visit
Admission: RE | Admit: 2015-08-05 | Discharge: 2015-08-05 | Disposition: A | Discharge status: Home / Self Care | Payer: Medicare Other | Source: Ambulatory Visit | Attending: Interventional Radiology | Admitting: Interventional Radiology

## 2015-08-05 ENCOUNTER — Ambulatory Visit
Admission: RE | Admit: 2015-08-05 | Discharge: 2015-08-05 | Disposition: A | Discharge status: Home / Self Care | Payer: Medicare Other | Source: Ambulatory Visit | Attending: Surgery | Admitting: Surgery

## 2015-08-05 DIAGNOSIS — K353 Acute appendicitis with localized peritonitis: Secondary | ICD-10-CM | POA: Diagnosis not present

## 2015-08-05 MED ORDER — IOPAMIDOL (ISOVUE-300) INJECTION 61%
100.0000 mL | Freq: Once | INTRAVENOUS | Status: AC | PRN
  Administered 2015-08-05: 100 mL via INTRAVENOUS

## 2015-08-11 ENCOUNTER — Other Ambulatory Visit (HOSPITAL_COMMUNITY): Payer: Self-pay | Admitting: Interventional Radiology

## 2015-08-11 DIAGNOSIS — R21 Rash and other nonspecific skin eruption: Secondary | ICD-10-CM | POA: Diagnosis not present

## 2015-08-11 DIAGNOSIS — R600 Localized edema: Secondary | ICD-10-CM | POA: Diagnosis not present

## 2015-08-11 DIAGNOSIS — K651 Peritoneal abscess: Secondary | ICD-10-CM

## 2015-08-11 DIAGNOSIS — I8310 Varicose veins of unspecified lower extremity with inflammation: Secondary | ICD-10-CM | POA: Diagnosis not present

## 2015-08-17 ENCOUNTER — Telehealth: Payer: Self-pay | Admitting: Oncology

## 2015-08-17 ENCOUNTER — Other Ambulatory Visit (HOSPITAL_BASED_OUTPATIENT_CLINIC_OR_DEPARTMENT_OTHER): Payer: Medicare Other

## 2015-08-17 ENCOUNTER — Ambulatory Visit (HOSPITAL_BASED_OUTPATIENT_CLINIC_OR_DEPARTMENT_OTHER): Payer: Medicare Other | Admitting: Nurse Practitioner

## 2015-08-17 VITALS — BP 154/53 | HR 87 | Temp 98.8°F | Resp 18 | Ht 67.0 in | Wt 167.9 lb

## 2015-08-17 DIAGNOSIS — C49A3 Gastrointestinal stromal tumor of small intestine: Secondary | ICD-10-CM

## 2015-08-17 DIAGNOSIS — C494 Malignant neoplasm of connective and soft tissue of abdomen: Secondary | ICD-10-CM

## 2015-08-17 DIAGNOSIS — C786 Secondary malignant neoplasm of retroperitoneum and peritoneum: Secondary | ICD-10-CM

## 2015-08-17 DIAGNOSIS — C179 Malignant neoplasm of small intestine, unspecified: Secondary | ICD-10-CM | POA: Diagnosis not present

## 2015-08-17 DIAGNOSIS — D649 Anemia, unspecified: Secondary | ICD-10-CM

## 2015-08-17 LAB — COMPREHENSIVE METABOLIC PANEL (CC13)
ALBUMIN: 2.5 g/dL — AB (ref 3.5–5.0)
ALK PHOS: 101 U/L (ref 40–150)
ALT: 32 U/L (ref 0–55)
AST: 31 U/L (ref 5–34)
Anion Gap: 7 mEq/L (ref 3–11)
BUN: 16.4 mg/dL (ref 7.0–26.0)
CALCIUM: 8.4 mg/dL (ref 8.4–10.4)
CO2: 24 mEq/L (ref 22–29)
CREATININE: 1.2 mg/dL (ref 0.7–1.3)
Chloride: 111 mEq/L — ABNORMAL HIGH (ref 98–109)
EGFR: 53 mL/min/{1.73_m2} — ABNORMAL LOW (ref >90)
GLUCOSE: 122 mg/dL (ref 70–140)
Potassium: 3.9 mEq/L (ref 3.5–5.1)
SODIUM: 142 meq/L (ref 136–145)
TOTAL PROTEIN: 6.9 g/dL (ref 6.4–8.3)
Total Bilirubin: 0.24 mg/dL (ref 0.20–1.20)

## 2015-08-17 LAB — IRON AND TIBC CHCC
%SAT: 21 % (ref 20–55)
Iron: 52 ug/dL (ref 42–163)
TIBC: 253 ug/dL (ref 202–409)
UIBC: 201 ug/dL (ref 117–376)

## 2015-08-17 LAB — CBC WITH DIFFERENTIAL/PLATELET
BASO%: 0.5 % (ref 0.0–2.0)
BASOS ABS: 0 10*3/uL (ref 0.0–0.1)
EOS ABS: 0.1 10*3/uL (ref 0.0–0.5)
EOS%: 2.7 % (ref 0.0–7.0)
HCT: 29.7 % — ABNORMAL LOW (ref 38.4–49.9)
HEMOGLOBIN: 9.9 g/dL — AB (ref 13.0–17.1)
LYMPH%: 25.3 % (ref 14.0–49.0)
MCH: 34 pg — ABNORMAL HIGH (ref 27.2–33.4)
MCHC: 33.4 g/dL (ref 32.0–36.0)
MCV: 101.7 fL — AB (ref 79.3–98.0)
MONO#: 0.5 10*3/uL (ref 0.1–0.9)
MONO%: 9 % (ref 0.0–14.0)
NEUT#: 3.5 10*3/uL (ref 1.5–6.5)
NEUT%: 62.5 % (ref 39.0–75.0)
PLATELETS: 321 10*3/uL (ref 140–400)
RBC: 2.92 10*6/uL — ABNORMAL LOW (ref 4.20–5.82)
RDW: 17.4 % — AB (ref 11.0–14.6)
WBC: 5.5 10*3/uL (ref 4.0–10.3)
lymph#: 1.4 10*3/uL (ref 0.9–3.3)

## 2015-08-17 LAB — FERRITIN CHCC: FERRITIN: 387 ng/mL — AB (ref 22–316)

## 2015-08-17 NOTE — Telephone Encounter (Signed)
Pt confirmed labs/ov per 08/29 POF, gave pt AVS and Calendar... KJ °

## 2015-08-17 NOTE — Progress Notes (Addendum)
[name] [name] OFFICE PROGRESS NOTE   Diagnosis:  Gastrointestinal stromal tumor  INTERVAL HISTORY:   [name] [name] returns as scheduled. He continues [place]. He denies nausea/vomiting. No diarrhea. He had a rash around the ankles about 1 week ago. The rash has resolved. He notes ankle edema nearly resolves overnight and worsens during the day. He continues Lasix.  He denies abdominal pain. No fever. He continues to have a percutaneous drainage catheter. He estimates 1 teaspoon to 1 tablespoon of drainage per day. He has a follow-up visit in the radiology drain clinic later this week.  Objective:  Vital signs in last 24 hours:  Blood pressure 154/53, pulse 87, temperature 98.8 F (37.1 C), temperature source Oral, resp. rate 18, height 5' 7"  (1.702 m), weight 167 lb 14.4 oz (76.159 kg), SpO2 100 %.    HEENT: No thrush or ulcers. Resp: Lungs clear bilaterally. Cardio: Regular rate and rhythm. Occasional premature beat. GI: Mild abdominal distention. No hepatomegaly. No mass. Drainage catheter right lower quadrant. Vascular: Anasarca with 2+ edema throughout the legs bilaterally. Skin: Faint fine rash over the upper back.    Lab Results:  Lab Results  Component Value Date   WBC 5.5 08/17/2015   HGB 9.9* 08/17/2015   HCT 29.7* 08/17/2015   MCV 101.7* 08/17/2015   PLT 321 08/17/2015   NEUTROABS 3.5 08/17/2015    Imaging:  No results found.  Medications: I have reviewed the patient's current medications.  Assessment/Plan: 1. 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 History of anemia, status post a nondiagnostic bone marrow biopsy 10/21/2014  3. NSTEMI March 2014  4. Admission 05/06/2015 with acute onset right abdomen pain-potentially related to acute appendicitis versus pain from carcinomatosis  CT 05/13/2015 consistent with a right lower abdomen abscess, status post catheter drainage by interventional radiology 05/13/2015  Follow-up CT 05/19/2015 showed resolution of the abscess.  Follow-up evaluation in interventional radiology 06/01/2015 showed the abscess cavity had resolved. The abscess drainage catheter communicated with the cecum via the appendix. The catheter was left in place.  CT abdomen/pelvis 06/08/2015 showed the right pelvic drain in place without recurrent or residual surrounding fluid collection. Peritoneal metastasis with slight increase in small to moderate volume of abdominal pelvic ascites.  Drainage catheter injection 06/12/2015 showed residual tiny fistulous connection with the end of the decompressed abscess cavity within the right lower abdominal quadrant and the residual lumen of the appendix.  Paracentesis 06/12/2015 with 5 liters of fluid removed  Paracentesis 06/19/2015-negative cytology and culture  Removal of abscess drainage catheter 06/23/2015  CT 07/26/2015 with increased right abdominal wall inflammation and increased fluid collection at site of previous right abdomen abscess, ascites, and enlargement/inflammation of the appendix  Placement of right lower quadrant percutaneous drainage catheter 07/27/2015. Contrast injection confirmed persistent fistulous connection with the ill-defined fluid collection and the residual appendix and cecum. Paracentesis with 2 L of fluid removed.  Follow-up CT abdomen/pelvis 08/05/2015 with small to moderate left pleural effusion and moderate to large volume ascites with both appearing slightly decreased since the prior study. Right lower  quadrant drainage catheter in place. No residual fluid collection around the drain or in the  adjacent abdomen or pelvis. 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 Drainage catheter left in place.  5. C. difficile colitis 05/10/2015    Disposition: [name] [name] will continue Gleevec. He will follow-up with interventional radiology and Dr. [name] regarding the drainage catheter. We scheduled a return visit in one month. He will contact the office in the interim with any problems.  Patient seen with Dr. [name].    Ned Card ANP/GNP-BC   08/17/2015  4:01 PM This was a shared visit with Ned Card. [name] [name] was interviewed and examined.  [name] [name] has a persistent right lower quadrant fistula/abscess. This is being managed by interventional radiology and Dr. [name]. There is been no clear evidence of tumor progression. He will continue Gleevec.  Julieanne Manson, M.D.

## 2015-08-19 ENCOUNTER — Other Ambulatory Visit (HOSPITAL_COMMUNITY): Payer: Self-pay | Admitting: Interventional Radiology

## 2015-08-19 ENCOUNTER — Ambulatory Visit
Admission: RE | Admit: 2015-08-19 | Discharge: 2015-08-19 | Disposition: A | Discharge status: Home / Self Care | Payer: Medicare Other | Source: Ambulatory Visit | Attending: Interventional Radiology | Admitting: Interventional Radiology

## 2015-08-19 ENCOUNTER — Other Ambulatory Visit: Payer: Self-pay | Admitting: Interventional Radiology

## 2015-08-19 DIAGNOSIS — K651 Peritoneal abscess: Secondary | ICD-10-CM

## 2015-08-19 DIAGNOSIS — K353 Acute appendicitis with localized peritonitis: Secondary | ICD-10-CM | POA: Diagnosis not present

## 2015-08-19 LAB — ERYTHROPOIETIN: ERYTHROPOIETIN: 19 m[IU]/mL — AB (ref 2.6–18.5)

## 2015-08-19 NOTE — Progress Notes (Signed)
Referring Physician(s): Dr. [name]  Chief Complaint: The patient is seen in follow up today s/p periappendiceal abscess drain  History of present illness: Periappendiceal abscess drain recently replaced on 07/27/2015 after fistulous connection demonstrated on exit site injection. The patient states minimal output <5cc/daily. He is still flushing the drain daily and denies any abdominal pain, fever or chills. He states he has had recent follow up with Dr. [name] last week and the decision at this time is not to undergo surgical intervention.    Past Medical History  Diagnosis Date  . Hyperlipidemia     TAKES zOCOR DAILY  . Abdominal mass 02/2013  . UTI (urinary tract infection)   . Hypertension     TAKES LOTREL,HCTZ,AND METOPROLOL DAILY  . NSTEMI (non-ST elevated myocardial infarction) 02/2013    "LIGHT: ONE MD SAID HE DID AND ONE SAID HE DIDN'T  . Pneumonia     MAR 2014  . Stromal tumor of digestive system   . CAD (coronary artery disease)   . Shortness of breath   . Acute respiratory failure with hypoxia 09/12/2014  . Hemorrhagic shock 09/13/2014  . Intraperitoneal hemorrhage 09/17/2014  . CKD (chronic kidney disease), stage III     Past Surgical History  Procedure Laterality Date  . Cyst excision  1962    wrist  . Cardiac catheterization  03-05-13  . Laparotomy N/A 06/04/2013    Procedure: EXPLORATORY LAPAROTOMY, OPEN RESECTION OF MESENTERIC AND INTESTINAL MASS;  Surgeon: [name], MD;  Location: [facility];  Service: General;  Laterality: N/A;  Small Bowel Resection  . Colonoscopy  09/08/2014    dr scooler  . Left heart catheterization with coronary angiogram N/A 03/05/2013    Procedure: LEFT HEART CATHETERIZATION WITH CORONARY ANGIOGRAM;  Surgeon: [name], MD;  Location: [facility] CATH LAB;  Service: Cardiovascular;  Laterality: N/A;    Allergies: Review of patient's allergies indicates no known allergies.  Medications: Prior to Admission medications   Medication  Sig Start Date End Date Taking? Authorizing Provider  amLODipine (NORVASC) 5 MG tablet Take 5 mg by mouth daily. 03/17/15   Historical Provider, MD  amoxicillin-clavulanate (AUGMENTIN) 875-125 MG per tablet Take 1 tablet by mouth 2 (two) times daily. 07/28/15   Nat Christen, PA-C  furosemide (LASIX) 20 MG tablet Take 1 tablet (20 mg total) by mouth daily. 05/20/15   Hosie Poisson, MD  HYDROcodone-acetaminophen (NORCO/VICODIN) 5-325 MG per tablet Take 1 tablet by mouth every 4 (four) hours as needed for moderate pain. 07/29/15   Debbe Odea, MD  imatinib (GLEEVEC) 400 MG tablet Take 1 tablet (400 mg total) by mouth daily. Take with meals and large glass of water.Caution:Chemotherapy. 02/13/15   Ladell Pier, MD  megestrol (MEGACE) 40 MG/ML suspension Take 40 mg by mouth 2 (two) times daily.    Historical Provider, MD  metoprolol tartrate (LOPRESSOR) 25 MG tablet Take 1 tablet (25 mg total) by mouth 2 (two) times daily. 03/06/13   Costin Karlyne Greenspan, MD  mirtazapine (REMERON) 15 MG tablet Take 15 mg by mouth at bedtime.    Historical Provider, MD  saccharomyces boulardii (FLORASTOR) 250 MG capsule Take 1 capsule (250 mg total) by mouth 2 (two) times daily. 07/29/15   Debbe Odea, MD  simvastatin (ZOCOR) 20 MG tablet Take 20 mg by mouth every evening.    Historical Provider, MD     Family History  Problem Relation Age of Onset  . Skin cancer Brother   . Stroke Mother   .  Skin cancer Father     Social History   Social History  . Marital Status: Married    Spouse Name: [name]  . Number of Children: 2  . Years of Education: N/A   Occupational History  . Retired     Writer   Social History Main Topics  . Smoking status: Never Smoker   . Smokeless tobacco: Never Used  . Alcohol Use: No  . Drug Use: No  . Sexual Activity: No   Other Topics Concern  . Not on file   Social History Narrative   Married, wife [name]   Retired Environmental manager for 40 years   Independent in [place]   #2 grown sons    Vital Signs: There were no vitals taken for this visit.  Physical Exam General: A&Ox, NAD Abd: Soft, NT, ND, RLQ perc drain intact with < 5cc serous fluid in gravity bag  Imaging: No results found.  Labs:  CBC:  Recent Labs  07/26/15 1731 07/27/15 0514 07/28/15 0555 08/17/15 1450  WBC 12.5* 8.1 6.3 5.5  HGB 8.5* 8.0* 8.1* 9.9*  HCT 26.6* 25.4* 25.3* 29.7*  PLT 332 303 300 321    COAGS:  Recent Labs  10/21/14 0820 01/13/15 0100 05/06/15 0304 07/26/15 1731  INR 1.07 1.05 1.14 1.17  APTT 29  --   --  27    BMP:  Recent Labs  07/26/15 1222 07/26/15 1731 07/27/15 0514 07/28/15 0555 08/17/15 1450  NA 140 137 138 139 142  K 3.6 3.7 4.0 4.2 3.9  CL 111 113* 112* 112*  --   CO2 24 22 23 23 24   GLUCOSE 124* 96 96 108* 122  BUN 18 15 17 19  16.4  CALCIUM 7.9* 7.2* 7.7* 7.6* 8.4  CREATININE 1.28* 1.18 1.26* 1.19 1.2  GFRNONAA 50* 55* 51* 54*  --   GFRAA 58* >60 59* >60  --     LIVER FUNCTION TESTS:  Recent Labs  07/08/15 0828 07/26/15 1220 07/26/15 1731 08/17/15 1450  BILITOT 0.29 0.3 0.4 0.24  AST 27 34 27 31  ALT 34 37 28 32  ALKPHOS 105 129* 100 101  PROT 5.9* 6.4* 5.3* 6.9  ALBUMIN 1.7* 2.2* 1.9* 2.5*    Assessment: Gastrointestinal stromal tumor- follows with Dr. [name] Periappendiceal abscess follows with Dr. Marlene Bast surgical intervention scheduled at this time Periappendiceal abscess catheter placed 05/13/15 followed by CT imaging and drain injections- catheter was removed 06/23/15 after resolution of abscess and fistulous connection  Drain replaced 07/27/2015 after fistulous connection demonstrated on exit site injection, minimal output <5cc/daily-still flushing-denies abdominal pain, fever or chills  Seen in follow-up today with drain injection (+) fistula to colon Recommend follow up in 1 month in IR clinic with repeat drain injection and patient was instructed to stop flushing the drain.     SignedHedy Jacob 08/19/2015, 3:42 PM   Please refer to Dr. Pascal Lux attestation of this note for management and plan.

## 2015-08-31 DIAGNOSIS — I2510 Atherosclerotic heart disease of native coronary artery without angina pectoris: Secondary | ICD-10-CM | POA: Diagnosis not present

## 2015-08-31 DIAGNOSIS — K432 Incisional hernia without obstruction or gangrene: Secondary | ICD-10-CM | POA: Diagnosis not present

## 2015-08-31 DIAGNOSIS — Z8509 Personal history of malignant neoplasm of other digestive organs: Secondary | ICD-10-CM | POA: Diagnosis not present

## 2015-08-31 DIAGNOSIS — Z8619 Personal history of other infectious and parasitic diseases: Secondary | ICD-10-CM | POA: Diagnosis not present

## 2015-08-31 DIAGNOSIS — K4090 Unilateral inguinal hernia, without obstruction or gangrene, not specified as recurrent: Secondary | ICD-10-CM | POA: Diagnosis not present

## 2015-08-31 DIAGNOSIS — K651 Peritoneal abscess: Secondary | ICD-10-CM | POA: Diagnosis not present

## 2015-09-10 ENCOUNTER — Telehealth: Payer: Self-pay | Admitting: *Deleted

## 2015-09-10 ENCOUNTER — Other Ambulatory Visit: Payer: Self-pay | Admitting: *Deleted

## 2015-09-10 DIAGNOSIS — C49A3 Gastrointestinal stromal tumor of small intestine: Secondary | ICD-10-CM

## 2015-09-10 DIAGNOSIS — C762 Malignant neoplasm of abdomen: Secondary | ICD-10-CM

## 2015-09-10 NOTE — Telephone Encounter (Signed)
Called patient after voicemail left by wife [name].  "He needs fluid drained off."  Admits to "wee bit of discomfort to his stomach.  Tummy feels tight when he exhales.  Feels firm to touch.  Sometimes there's more drainage than other.  1 TBLE today."  Asked if he has a PleurX or Aspira drain.  "He's had it drained before and was told whenever he needs it drained to call.  We'll be out today until 4:00 because I have an appointment.  Anytime tomorrow will be good."  Will notify providers.  Last paracentesis was 08-05-2015 per EMR.  Next F/U is 09-14-2015 with Ned Card NP.

## 2015-09-11 ENCOUNTER — Ambulatory Visit (HOSPITAL_COMMUNITY)
Admission: RE | Admit: 2015-09-11 | Discharge: 2015-09-11 | Disposition: A | Discharge status: Home / Self Care | Payer: Medicare Other | Source: Ambulatory Visit | Attending: Oncology | Admitting: Oncology

## 2015-09-11 ENCOUNTER — Other Ambulatory Visit: Payer: Self-pay | Admitting: Oncology

## 2015-09-11 DIAGNOSIS — C494 Malignant neoplasm of connective and soft tissue of abdomen: Secondary | ICD-10-CM | POA: Diagnosis not present

## 2015-09-11 DIAGNOSIS — R188 Other ascites: Secondary | ICD-10-CM | POA: Insufficient documentation

## 2015-09-11 DIAGNOSIS — C762 Malignant neoplasm of abdomen: Secondary | ICD-10-CM

## 2015-09-11 DIAGNOSIS — C49A3 Gastrointestinal stromal tumor of small intestine: Secondary | ICD-10-CM

## 2015-09-11 MED ORDER — LIDOCAINE HCL (PF) 1 % IJ SOLN
INTRAMUSCULAR | Status: AC
  Filled 2015-09-11: qty 10

## 2015-09-11 NOTE — Telephone Encounter (Signed)
Late Entry:  Notified pt's wife 09/10/15 that therapeutic paracentesis has been scheduled for 09/11/15 @ [facility]; no availability at [facility]; need to arrive at 10:45.  Pt's wife verbalized understanding and expressed appreciation.

## 2015-09-11 NOTE — Procedures (Signed)
Successful US guided paracentesis from RUQ.  Yielded 2.4 liters of serous fluid.  No immediate complications.  Pt tolerated well.   Specimen was sent for labs.  Tsosie Billing D PA-C 09/11/2015 11:50 AM

## 2015-09-14 ENCOUNTER — Telehealth: Payer: Self-pay | Admitting: Oncology

## 2015-09-14 ENCOUNTER — Ambulatory Visit (HOSPITAL_BASED_OUTPATIENT_CLINIC_OR_DEPARTMENT_OTHER): Payer: Medicare Other | Admitting: Nurse Practitioner

## 2015-09-14 ENCOUNTER — Other Ambulatory Visit (HOSPITAL_BASED_OUTPATIENT_CLINIC_OR_DEPARTMENT_OTHER): Payer: Medicare Other

## 2015-09-14 VITALS — BP 154/62 | HR 81 | Temp 98.5°F | Resp 18 | Ht 67.0 in | Wt 158.5 lb

## 2015-09-14 DIAGNOSIS — C49A3 Gastrointestinal stromal tumor of small intestine: Secondary | ICD-10-CM

## 2015-09-14 DIAGNOSIS — C179 Malignant neoplasm of small intestine, unspecified: Secondary | ICD-10-CM | POA: Diagnosis not present

## 2015-09-14 LAB — CBC WITH DIFFERENTIAL/PLATELET
BASO%: 0.5 % (ref 0.0–2.0)
Basophils Absolute: 0 10*3/uL (ref 0.0–0.1)
EOS%: 3.2 % (ref 0.0–7.0)
Eosinophils Absolute: 0.1 10*3/uL (ref 0.0–0.5)
HCT: 30.1 % — ABNORMAL LOW (ref 38.4–49.9)
HGB: 10.1 g/dL — ABNORMAL LOW (ref 13.0–17.1)
LYMPH%: 26.3 % (ref 14.0–49.0)
MCH: 34.3 pg — ABNORMAL HIGH (ref 27.2–33.4)
MCHC: 33.5 g/dL (ref 32.0–36.0)
MCV: 102.3 fL — AB (ref 79.3–98.0)
MONO#: 0.4 10*3/uL (ref 0.1–0.9)
MONO%: 8.6 % (ref 0.0–14.0)
NEUT%: 61.4 % (ref 39.0–75.0)
NEUTROS ABS: 2.8 10*3/uL (ref 1.5–6.5)
Platelets: 264 10*3/uL (ref 140–400)
RBC: 2.95 10*6/uL — AB (ref 4.20–5.82)
RDW: 15.7 % — ABNORMAL HIGH (ref 11.0–14.6)
WBC: 4.6 10*3/uL (ref 4.0–10.3)
lymph#: 1.2 10*3/uL (ref 0.9–3.3)

## 2015-09-14 LAB — COMPREHENSIVE METABOLIC PANEL (CC13)
ALT: 35 U/L (ref 0–55)
AST: 28 U/L (ref 5–34)
Albumin: 2.8 g/dL — ABNORMAL LOW (ref 3.5–5.0)
Alkaline Phosphatase: 105 U/L (ref 40–150)
Anion Gap: 7 mEq/L (ref 3–11)
BILIRUBIN TOTAL: 0.32 mg/dL (ref 0.20–1.20)
BUN: 22.7 mg/dL (ref 7.0–26.0)
CO2: 24 meq/L (ref 22–29)
Calcium: 8.3 mg/dL — ABNORMAL LOW (ref 8.4–10.4)
Chloride: 112 mEq/L — ABNORMAL HIGH (ref 98–109)
Creatinine: 1.3 mg/dL (ref 0.7–1.3)
EGFR: 50 mL/min/{1.73_m2} — AB (ref >90)
GLUCOSE: 143 mg/dL — AB (ref 70–140)
Potassium: 3.8 mEq/L (ref 3.5–5.1)
SODIUM: 143 meq/L (ref 136–145)
TOTAL PROTEIN: 6.8 g/dL (ref 6.4–8.3)

## 2015-09-14 NOTE — Telephone Encounter (Signed)
Gave patient avs report and appointments for October  °

## 2015-09-14 NOTE — Progress Notes (Addendum)
[name] OFFICE PROGRESS NOTE   Diagnosis:  Gastrointestinal stromal tumor  INTERVAL HISTORY:   [name] [name] returns as scheduled. He continues [place]. He denies nausea/vomiting. No diarrhea. He periodically notes "soft stools". No skin rash. He reports a good appetite. He continues to have a percutaneous drainage catheter. He notes minimal drainage. He had a paracentesis 09/11/2015. He noted no significant change in abdominal fullness following the procedure.  Objective:  Vital signs in last 24 hours:  Blood pressure 154/62, pulse 81, temperature 98.5 F (36.9 C), temperature source Oral, resp. rate 18, height 5' 7"  (1.702 m), weight 158 lb 8 oz (71.895 kg), SpO2 100 %.    HEENT: No thrush or ulcers. Lymphatics: No palpable cervical or supraclavicular lymph nodes. Resp: Lungs clear bilaterally. Cardio: Regular rate and rhythm. GI: Mild abdominal distention. Ascites. No hepatomegaly. No mass. Drainage catheter right lower quadrant. Vascular: Pitting edema at the lower legs bilaterally. Skin: Faint fine erythematous rash over the upper back and chest.    Lab Results:  Lab Results  Component Value Date   WBC 4.6 09/14/2015   HGB 10.1* 09/14/2015   HCT 30.1* 09/14/2015   MCV 102.3* 09/14/2015   PLT 264 09/14/2015   NEUTROABS 2.8 09/14/2015    Imaging:  No results found.  Medications: I have reviewed the patient's current medications.  Assessment/Plan: 1. 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 History of anemia,  status post a nondiagnostic bone marrow biopsy 10/21/2014  3. NSTEMI March 2014  4. Admission 05/06/2015 with acute onset right abdomen pain-potentially related to acute appendicitis versus pain from carcinomatosis  CT 05/13/2015 consistent with a right lower abdomen abscess, status post catheter drainage by interventional radiology 05/13/2015  Follow-up CT 05/19/2015 showed resolution of the abscess.  Follow-up evaluation in interventional radiology 06/01/2015 showed the abscess cavity had resolved. The abscess drainage catheter communicated with the cecum via the appendix. The catheter was left in place.  CT abdomen/pelvis 06/08/2015 showed the right pelvic drain in place without recurrent or residual surrounding fluid collection. Peritoneal metastasis with slight increase in small to moderate volume of abdominal pelvic ascites.  Drainage catheter injection 06/12/2015 showed residual tiny fistulous connection with the end of the decompressed abscess cavity within the right lower abdominal quadrant and the residual lumen of the appendix.  Paracentesis 06/12/2015 with 5 liters of fluid removed  Paracentesis 06/19/2015-negative cytology and culture  Removal of abscess drainage catheter 06/23/2015  CT 07/26/2015 with increased right abdominal wall inflammation and increased fluid collection at site of previous right abdomen abscess, ascites, and enlargement/inflammation of the appendix  Placement of right lower quadrant percutaneous drainage catheter 07/27/2015. Contrast injection confirmed persistent fistulous connection with the ill-defined fluid collection and the residual appendix and cecum. Paracentesis with 2 L of fluid removed.  Follow-up CT abdomen/pelvis 08/05/2015 with small to moderate left pleural effusion and moderate to large volume ascites with both appearing slightly decreased since the prior study. Right lower quadrant drainage catheter in place. No residual fluid collection  around the drain or in the adjacent abdomen or pelvis. 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 Drainage catheter left  in place.  5. C. difficile colitis 05/10/2015   Disposition: [name] [name] appears stable. He will continue Gleevec.  He will continue follow-up with interventional radiology and Dr. [name] regarding the right lower quadrant fistula/abscess/drainage catheter.  We scheduled a return visit in one month. He will contact the office in the interim with any problems.  Patient seen with Dr. [name].    Ned Card ANP/GNP-BC   09/14/2015  1:53 PM  This was a shared visit with Ned Card. [name] [name] was interviewed and examined. There is no clinical evidence for progression of the gastric intestinal stromal tumor. He will continue Gleevec.  Julieanne Manson, M.D.

## 2015-09-17 ENCOUNTER — Ambulatory Visit
Admission: RE | Admit: 2015-09-17 | Discharge: 2015-09-17 | Disposition: A | Discharge status: Home / Self Care | Payer: Medicare Other | Source: Ambulatory Visit | Attending: Interventional Radiology | Admitting: Interventional Radiology

## 2015-09-17 ENCOUNTER — Other Ambulatory Visit: Payer: Self-pay | Admitting: Interventional Radiology

## 2015-09-17 DIAGNOSIS — K651 Peritoneal abscess: Secondary | ICD-10-CM

## 2015-09-17 NOTE — Progress Notes (Signed)
Patient ID: [name] [name], male   DOB: 1929/07/18, 81 y.o.   MRN: [id number]   Referring Physician(s): Ingram,H Chief Complaint: The patient is seen in follow up today s/p periappendiceal abscess drain placement on 05/13/15.  History of present illness:  [name] [name] is an 81 year old white male, patient of Dr. Fanny Skates, with history of malignant GIST with peritoneal carcinomatosis who was subsequently found to have a ruptured appendicitis/associated periappendiceal abscess on CT scan performed on 05/13/15. He subsequently underwent drainage cath placement on 05/13/15. 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 He presents today for follow-up drain injection. He currently denies any fevers or chills. Drain output has been minimal. He has just completed course of antibiotics. He denies significant abdominal pain, nausea or vomiting. Drain is not being flushed at this time.  Past Medical History  Diagnosis Date  . Hyperlipidemia     TAKES zOCOR DAILY  . Abdominal mass 02/2013  . UTI (urinary tract infection)   . Hypertension     TAKES LOTREL,HCTZ,AND METOPROLOL DAILY  . NSTEMI (non-ST elevated myocardial infarction) 02/2013    "LIGHT: ONE MD SAID HE DID AND ONE SAID HE DIDN'T  . Pneumonia     MAR 2014  . Stromal tumor of digestive system   . CAD (coronary artery disease)   . Shortness of breath   . Acute respiratory failure with hypoxia 09/12/2014  . Hemorrhagic shock 09/13/2014  . Intraperitoneal hemorrhage 09/17/2014  . CKD (chronic kidney disease), stage III     Past Surgical History  Procedure Laterality Date  . Cyst excision  1962    wrist  . Cardiac catheterization  03-05-13  . Laparotomy N/A 06/04/2013    Procedure: EXPLORATORY LAPAROTOMY, OPEN RESECTION OF  MESENTERIC AND INTESTINAL MASS;  Surgeon: [name], MD;  Location: [facility];  Service: General;  Laterality: N/A;  Small Bowel Resection  . Colonoscopy  09/08/2014    dr scooler  . Left heart catheterization with coronary angiogram N/A 03/05/2013    Procedure: LEFT HEART CATHETERIZATION WITH CORONARY ANGIOGRAM;  Surgeon: [name], MD;  Location: [facility] CATH LAB;  Service: Cardiovascular;  Laterality: N/A;    Allergies: Review of patient's allergies indicates no known allergies.  Medications: Prior to Admission medications   Medication Sig Start Date End Date Taking? Authorizing Provider  amLODipine (NORVASC) 5 MG tablet Take 5 mg by mouth daily. 03/17/15   Historical Provider, MD  amoxicillin-clavulanate (AUGMENTIN) 875-125 MG per tablet Take 1 tablet by mouth 2 (two) times daily. 07/28/15   Nat Christen, PA-C  furosemide (LASIX) 20 MG tablet Take 1 tablet (20 mg total) by mouth daily. 05/20/15   Hosie Poisson, MD  GLEEVEC 400 MG tablet TAKE 1 TABLET BY MOUTH DAILY. TAKE WITH MEALS AND LARGE GLASS OF WATER. CAUTION:CHEMOTHERAPY. 09/11/15   Ladell Pier, MD  HYDROcodone-acetaminophen (NORCO/VICODIN) 5-325 MG per tablet Take 1 tablet by mouth every 4 (four) hours as needed for moderate pain. Patient not taking: Reported on 09/14/2015 07/29/15   Debbe Odea, MD  megestrol (MEGACE) 40 MG/ML suspension Take 40 mg by mouth 2 (two) times daily.    Historical Provider, MD  metoprolol tartrate (LOPRESSOR) 25 MG tablet Take 1 tablet (25 mg total) by mouth 2 (two) times daily. 03/06/13   Costin Karlyne Greenspan, MD  mirtazapine (REMERON) 15 MG tablet Take 15 mg by mouth at bedtime.    Historical Provider, MD  saccharomyces boulardii (FLORASTOR) 250 MG capsule Take 1 capsule (250 mg total) by mouth 2 (two) times daily. 07/29/15   Debbe Odea, MD  simvastatin (ZOCOR) 20 MG tablet Take 20 mg by mouth every evening.    Historical Provider, MD     Family History  Problem Relation Age of Onset  . Skin cancer  Brother   . Stroke Mother   . Skin cancer Father     Social History   Social History  . Marital Status: Married    Spouse Name: [name]  . Number of Children: 2  . Years of Education: N/A   Occupational History  . Retired     Writer   Social History Main Topics  . Smoking status: Never Smoker   . Smokeless tobacco: Never Used  . Alcohol Use: No  . Drug Use: No  . Sexual Activity: No   Other Topics Concern  . Not on file   Social History Narrative   Married, wife [name]   Retired Environmental manager for 19 years   Independent in [place]   #2 grown sons     Vital Signs: Stable/Afebrile  Physical Exam patient is awake, alert. Right lower quadrant drain intact, small amount of yellow fluid in bag. Insertion site nontender. Abdomen soft, sl distended.  Imaging: No results found.  Labs:  CBC:  Recent Labs  07/27/15 0514 07/28/15 0555 08/17/15 1450 09/14/15 1322  WBC 8.1 6.3 5.5 4.6  HGB 8.0* 8.1* 9.9* 10.1*  HCT 25.4* 25.3* 29.7* 30.1*  PLT 303 300 321 264    COAGS:  Recent Labs  10/21/14 0820 01/13/15 0100 05/06/15 0304 07/26/15 1731  INR 1.07 1.05 1.14 1.17  APTT 29  --   --  27    BMP:  Recent Labs  07/26/15 1222 07/26/15 1731 07/27/15 0514 07/28/15 0555 08/17/15 1450 09/14/15 1322  NA 140 137 138 139 142 143  K 3.6 3.7 4.0 4.2 3.9 3.8  CL 111 113* 112* 112*  --   --   CO2 24 22 23 23 24 24   GLUCOSE 124* 96 96 108* 122 143*  BUN 18 15 17 19  16.4 22.7  CALCIUM 7.9* 7.2* 7.7* 7.6* 8.4 8.3*  CREATININE 1.28* 1.18 1.26* 1.19 1.2 1.3  GFRNONAA 50* 55* 51* 54*  --   --   GFRAA 58* >60 59* >60  --   --     LIVER FUNCTION TESTS:  Recent Labs  07/26/15 1220 07/26/15 1731 08/17/15 1450 09/14/15 1322  BILITOT 0.3 0.4 0.24 0.32  AST 34 27 31 28   ALT 37 28 32 35  ALKPHOS 129* 100 101 105  PROT 6.4* 5.3* 6.9 6.8  ALBUMIN 2.2* 1.9* 2.5* 2.8*    Assessment: Patient with history of malignant GIST with peritoneal  carcinomatosis and drainage of a periappendiceal abscess initially on 05/13/15. 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 Follow-up drain injection today reveals persistent fistula. Plan at this time is to continue drainage catheter with follow-up injection in 1 month. Patient to continue follow-up with Dr. [name] as scheduled.   Signed: D. Rowe Robert 09/17/2015, 1:34 PM   Please refer to Dr.Wagner attestation of this note for management and plan.

## 2015-10-01 DIAGNOSIS — Z8619 Personal history of other infectious and parasitic diseases: Secondary | ICD-10-CM | POA: Diagnosis not present

## 2015-10-01 DIAGNOSIS — K432 Incisional hernia without obstruction or gangrene: Secondary | ICD-10-CM | POA: Diagnosis not present

## 2015-10-01 DIAGNOSIS — K4090 Unilateral inguinal hernia, without obstruction or gangrene, not specified as recurrent: Secondary | ICD-10-CM | POA: Diagnosis not present

## 2015-10-01 DIAGNOSIS — Z8509 Personal history of malignant neoplasm of other digestive organs: Secondary | ICD-10-CM | POA: Diagnosis not present

## 2015-10-01 DIAGNOSIS — I2510 Atherosclerotic heart disease of native coronary artery without angina pectoris: Secondary | ICD-10-CM | POA: Diagnosis not present

## 2015-10-01 DIAGNOSIS — K651 Peritoneal abscess: Secondary | ICD-10-CM | POA: Diagnosis not present

## 2015-10-13 ENCOUNTER — Telehealth: Payer: Self-pay | Admitting: Oncology

## 2015-10-13 ENCOUNTER — Other Ambulatory Visit (HOSPITAL_BASED_OUTPATIENT_CLINIC_OR_DEPARTMENT_OTHER): Payer: Medicare Other

## 2015-10-13 ENCOUNTER — Ambulatory Visit (HOSPITAL_BASED_OUTPATIENT_CLINIC_OR_DEPARTMENT_OTHER): Payer: Medicare Other | Admitting: Oncology

## 2015-10-13 VITALS — BP 161/70 | HR 69 | Temp 98.4°F | Resp 18 | Ht 67.0 in | Wt 161.5 lb

## 2015-10-13 DIAGNOSIS — C49A3 Gastrointestinal stromal tumor of small intestine: Secondary | ICD-10-CM

## 2015-10-13 DIAGNOSIS — C494 Malignant neoplasm of connective and soft tissue of abdomen: Secondary | ICD-10-CM

## 2015-10-13 DIAGNOSIS — C7989 Secondary malignant neoplasm of other specified sites: Secondary | ICD-10-CM

## 2015-10-13 DIAGNOSIS — Z23 Encounter for immunization: Secondary | ICD-10-CM | POA: Diagnosis not present

## 2015-10-13 LAB — COMPREHENSIVE METABOLIC PANEL (CC13)
ALBUMIN: 3 g/dL — AB (ref 3.5–5.0)
ALK PHOS: 90 U/L (ref 40–150)
ALT: 29 U/L (ref 0–55)
ANION GAP: 7 meq/L (ref 3–11)
AST: 23 U/L (ref 5–34)
BUN: 20.8 mg/dL (ref 7.0–26.0)
CALCIUM: 8.6 mg/dL (ref 8.4–10.4)
CO2: 24 mEq/L (ref 22–29)
CREATININE: 1.3 mg/dL (ref 0.7–1.3)
Chloride: 112 mEq/L — ABNORMAL HIGH (ref 98–109)
EGFR: 48 mL/min/{1.73_m2} — AB (ref >90)
GLUCOSE: 102 mg/dL (ref 70–140)
POTASSIUM: 3.7 meq/L (ref 3.5–5.1)
Sodium: 143 mEq/L (ref 136–145)
Total Bilirubin: 0.31 mg/dL (ref 0.20–1.20)
Total Protein: 6.6 g/dL (ref 6.4–8.3)

## 2015-10-13 LAB — CBC WITH DIFFERENTIAL/PLATELET
BASO%: 0.4 % (ref 0.0–2.0)
BASOS ABS: 0 10*3/uL (ref 0.0–0.1)
EOS ABS: 0.2 10*3/uL (ref 0.0–0.5)
EOS%: 3.6 % (ref 0.0–7.0)
HEMATOCRIT: 28.4 % — AB (ref 38.4–49.9)
HEMOGLOBIN: 9.8 g/dL — AB (ref 13.0–17.1)
LYMPH#: 1.1 10*3/uL (ref 0.9–3.3)
LYMPH%: 22.4 % (ref 14.0–49.0)
MCH: 35.1 pg — AB (ref 27.2–33.4)
MCHC: 34.3 g/dL (ref 32.0–36.0)
MCV: 102.3 fL — AB (ref 79.3–98.0)
MONO#: 0.4 10*3/uL (ref 0.1–0.9)
MONO%: 8.6 % (ref 0.0–14.0)
NEUT#: 3.2 10*3/uL (ref 1.5–6.5)
NEUT%: 65 % (ref 39.0–75.0)
PLATELETS: 226 10*3/uL (ref 140–400)
RBC: 2.78 10*6/uL — ABNORMAL LOW (ref 4.20–5.82)
RDW: 14 % (ref 11.0–14.6)
WBC: 5 10*3/uL (ref 4.0–10.3)

## 2015-10-13 MED ORDER — INFLUENZA VAC SPLIT QUAD 0.5 ML IM SUSY
0.5000 mL | PREFILLED_SYRINGE | Freq: Once | INTRAMUSCULAR | Status: AC
  Administered 2015-10-13: 0.5 mL via INTRAMUSCULAR
  Filled 2015-10-13: qty 0.5

## 2015-10-13 NOTE — Telephone Encounter (Signed)
Gave adn printed appt sched and avs for pt for NOv

## 2015-10-13 NOTE — Progress Notes (Signed)
[place] OFFICE PROGRESS NOTE   Diagnosis: Gastrointestinal stromal tumor  INTERVAL HISTORY:   Mr. Odonovan returns as scheduled. He continues [place]. He reports mild left periorbital edema in the mornings that resolve spontaneously during the day. The abdominal drain remains in place. Minimal drainage. No fever. Good appetite. He is followed by interventional radiology and Dr. [name]. He last underwent a paracentesis for 2.4 L on 09/11/2015.  Objective:  Vital signs in last 24 hours:  Blood pressure 161/70, pulse 69, temperature 98.4 F (36.9 C), temperature source Oral, resp. rate 18, height 5' 7"  (1.702 m), weight 161 lb 8 oz (73.256 kg), SpO2 100 %.    HEENT: No thrush or ulcers, very minimal periorbital edema Resp: Lungs clear bilaterally Cardio: Regular rhythm with premature beats GI: No hepatosplenomegaly, mildly distended, no mass, right lower quadrant drain Vascular: Trace pitting edema at the lower leg bilaterally   Lab Results:  Lab Results  Component Value Date   WBC 5.0 10/13/2015   HGB 9.8* 10/13/2015   HCT 28.4* 10/13/2015   MCV 102.3* 10/13/2015   PLT 226 10/13/2015   NEUTROABS 3.2 10/13/2015    Medications: I have reviewed the patient's current medications.  Assessment/Plan: 1. 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 History of anemia, status post a nondiagnostic bone marrow biopsy 10/21/2014  3. NSTEMI March 2014  4. Admission 05/06/2015 with acute onset right abdomen pain-potentially related to  acute appendicitis versus pain from carcinomatosis  CT 05/13/2015 consistent with a right lower abdomen abscess, status post catheter drainage by interventional radiology 05/13/2015  Follow-up CT 05/19/2015 showed resolution of the abscess.  Follow-up evaluation in interventional radiology 06/01/2015 showed the abscess cavity had resolved. The abscess drainage catheter communicated with the cecum via the appendix. The catheter was left in place.  CT abdomen/pelvis 06/08/2015 showed the right pelvic drain in place without recurrent or residual surrounding fluid collection. Peritoneal metastasis with slight increase in small to moderate volume of abdominal pelvic ascites.  Drainage catheter injection 06/12/2015 showed residual tiny fistulous connection with the end of the decompressed abscess cavity within the right lower abdominal quadrant and the residual lumen of the appendix.  Paracentesis 06/12/2015 with 5 liters of fluid removed  Paracentesis 06/19/2015-negative cytology and culture  Removal of abscess drainage catheter 06/23/2015  CT 07/26/2015 with increased right abdominal wall inflammation and increased fluid collection at site of previous right abdomen abscess, ascites, and enlargement/inflammation of the appendix  Placement of right lower quadrant percutaneous drainage catheter 07/27/2015. Contrast injection confirmed persistent fistulous connection with the ill-defined fluid collection and the residual appendix and cecum. Paracentesis with 2 L of fluid removed.  Follow-up CT abdomen/pelvis 08/05/2015 with small to moderate left pleural effusion and moderate to large volume ascites with both appearing slightly decreased since the prior study. Right lower quadrant drainage catheter in place. No residual fluid collection around the drain or in the adjacent abdomen or pelvis. 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 Drainage catheter  left in place.  5. C. difficile colitis 05/10/2015   Disposition:  Mr. Axel appears stable. There is no clinical evidence for progression of  the gastrointestinal stromal tumor. He will continue Gleevec. He is followed in interventional radiology and by Dr. [name] for the right lower quadrant fistula. Mr. Reine will return for an office visit in one month. He received an influenza vaccine today.  Betsy Coder, MD  10/13/2015  11:14 AM

## 2015-10-14 ENCOUNTER — Ambulatory Visit
Admission: RE | Admit: 2015-10-14 | Discharge: 2015-10-14 | Disposition: A | Discharge status: Home / Self Care | Payer: Medicare Other | Source: Ambulatory Visit | Attending: Interventional Radiology | Admitting: Interventional Radiology

## 2015-10-14 ENCOUNTER — Other Ambulatory Visit: Payer: Self-pay | Admitting: Interventional Radiology

## 2015-10-14 DIAGNOSIS — K651 Peritoneal abscess: Secondary | ICD-10-CM

## 2015-10-14 DIAGNOSIS — K353 Acute appendicitis with localized peritonitis: Secondary | ICD-10-CM | POA: Diagnosis not present

## 2015-10-14 NOTE — Progress Notes (Signed)
Patient ID: [name] [name], male   DOB: 1929/04/09, 81 y.o.   MRN: [id number]       Chief Complaint: Follow-up drain  History of Present Illness: [name] [name] is a 81 y.o. male with a long history of a right lower quadrant appendiceal abscess drain. He also has ascites and peritoneal carcinomatosis. At some point, the drain was removed, but the abscess recurred. He is being followed by surgery. He is not a surgical candidate. Currently, he is on antibiotics and has 30 days left. He denies any fever or chills. He denies any pain in the right lower quadrant other than due to his stitch. Output has been minimal. Past Medical History  Diagnosis Date  . Hyperlipidemia     TAKES zOCOR DAILY  . Abdominal mass 02/2013  . UTI (urinary tract infection)   . Hypertension     TAKES LOTREL,HCTZ,AND METOPROLOL DAILY  . NSTEMI (non-ST elevated myocardial infarction) 02/2013    "LIGHT: ONE MD SAID HE DID AND ONE SAID HE DIDN'T  . Pneumonia     MAR 2014  . Stromal tumor of digestive system   . CAD (coronary artery disease)   . Shortness of breath   . Acute respiratory failure with hypoxia 09/12/2014  . Hemorrhagic shock 09/13/2014  . Intraperitoneal hemorrhage 09/17/2014  . CKD (chronic kidney disease), stage III     Past Surgical History  Procedure Laterality Date  . Cyst excision  1962    wrist  . Cardiac catheterization  03-05-13  . Laparotomy N/A 06/04/2013    Procedure: EXPLORATORY LAPAROTOMY, OPEN RESECTION OF MESENTERIC AND INTESTINAL MASS;  Surgeon: [name], MD;  Location: [facility];  Service: General;  Laterality: N/A;  Small Bowel Resection  . Colonoscopy  09/08/2014    dr scooler  . Left heart catheterization with coronary angiogram N/A 03/05/2013    Procedure: LEFT HEART CATHETERIZATION WITH CORONARY ANGIOGRAM;  Surgeon: [name], MD;  Location: [facility] CATH LAB;  Service: Cardiovascular;  Laterality: N/A;    Allergies: Review of patient's allergies indicates no known  allergies.  Medications: Prior to Admission medications   Medication Sig Start Date End Date Taking? Authorizing Provider  amLODipine (NORVASC) 5 MG tablet Take 5 mg by mouth daily. 03/17/15   Historical Provider, MD  amoxicillin-clavulanate (AUGMENTIN) 875-125 MG per tablet Take 1 tablet by mouth 2 (two) times daily. 07/28/15   Nat Christen, PA-C  furosemide (LASIX) 20 MG tablet Take 1 tablet (20 mg total) by mouth daily. 05/20/15   Hosie Poisson, MD  GLEEVEC 400 MG tablet TAKE 1 TABLET BY MOUTH DAILY. TAKE WITH MEALS AND LARGE GLASS OF WATER. CAUTION:CHEMOTHERAPY. 09/11/15   Ladell Pier, MD  HYDROcodone-acetaminophen (NORCO/VICODIN) 5-325 MG per tablet Take 1 tablet by mouth every 4 (four) hours as needed for moderate pain. Patient not taking: Reported on 09/14/2015 07/29/15   Debbe Odea, MD  megestrol (MEGACE) 40 MG/ML suspension Take 40 mg by mouth 2 (two) times daily.    Historical Provider, MD  metoprolol tartrate (LOPRESSOR) 25 MG tablet Take 1 tablet (25 mg total) by mouth 2 (two) times daily. 03/06/13   Costin Karlyne Greenspan, MD  saccharomyces boulardii (FLORASTOR) 250 MG capsule Take 1 capsule (250 mg total) by mouth 2 (two) times daily. 07/29/15   Debbe Odea, MD  simvastatin (ZOCOR) 20 MG tablet Take 20 mg by mouth every evening.    Historical Provider, MD     Family History  Problem Relation Age of Onset  .  Skin cancer Brother   . Stroke Mother   . Skin cancer Father     Social History   Social History  . Marital Status: Married    Spouse Name: [name]  . Number of Children: 2  . Years of Education: N/A   Occupational History  . Retired     Writer   Social History Main Topics  . Smoking status: Never Smoker   . Smokeless tobacco: Never Used  . Alcohol Use: No  . Drug Use: No  . Sexual Activity: No   Other Topics Concern  . Not on file   Social History Narrative   Married, wife [name]   Retired Environmental manager for 54 years   Independent in [place]   #2 grown sons     Review of Systems: A 12 point ROS discussed and pertinent positives are indicated in the HPI above.  All other systems are negative.  Review of Systems  Vital Signs: BP 153/67 mmHg  Pulse 73  Temp(Src) 98.2 F (36.8 C) (Oral)  SpO2 100%  Physical Exam  Constitutional: He is oriented to person, place, and time. He appears well-developed and well-nourished.  Abdominal:  Right lower quadrant abscess drain site is clean and dry without evidence of erythema or pus.  Neurological: He is alert and oriented to person, place, and time.      Imaging: Dg Sinus/fist Tube Chk-non Gi  09/17/2015  CLINICAL DATA:  81 year old male with a history of ruptured appendicitis and subsequent drainage EXAM: ABSCESS INJECTION CONTRAST:  5 cc contrast FLUOROSCOPY TIME:  Fluoroscopy Time (in minutes and seconds): 18 seconds COMPARISON:  Fluoroscopy injection 08/19/2015.  CT 08/05/2015 FINDINGS: Scout image demonstrates pigtail catheter overlying the right iliac bone. Injection of small amount of contrast reveals no large abscess cavity with small amount of contrast limited to the pigtail catheter drain. There is a small amount of contrast tracking superiorly from the drain, with no definite colonic loops opacified. The majority of the contrast was evacuated through the percutaneous access site adjacent to the tube. IMPRESSION: Status post right lower quadrant drain injection demonstrates no persisting abscess cavity. It is uncertain whether the fistulous connection persists, as only a small amount of contrast tract superiorly from the drain, and further pressurized injection resulted in contrast evacuating through the skin access site adjacent to the tube. Signed, Dulcy Fanny. Earleen Newport, DO [facility] PLAN: The patient reports he has an upcoming surgery appointment midway through October, which we advised that he observed. Would not  inject this tube before 1 month from now for recheck. Electronically Signed   By: Corrie Mckusick D.O.   On: 09/17/2015 14:26   Fluoroscopic contrast injection into the drain today confirms a persistent fistula to the cecum. The abscess cavity remains decompressed.   Labs:  CBC:  Recent Labs  07/28/15 0555 08/17/15 1450 09/14/15 1322 10/13/15 1042  WBC 6.3 5.5 4.6 5.0  HGB 8.1* 9.9* 10.1* 9.8*  HCT 25.3* 29.7* 30.1* 28.4*  PLT 300 321 264 226    COAGS:  Recent Labs  10/21/14 0820 01/13/15 0100 05/06/15 0304 07/26/15 1731  INR 1.07 1.05 1.14 1.17  APTT 29  --   --  27    BMP:  Recent Labs  07/26/15 1222 07/26/15 1731 07/27/15 0514 07/28/15 0555 08/17/15 1450 09/14/15 1322 10/13/15 1042  NA 140 137 138 139 142 143 143  K 3.6 3.7 4.0 4.2 3.9 3.8 3.7  CL 111 113* 112* 112*  --   --   --   CO2 24 22 23 23 24 24 24   GLUCOSE 124* 96 96 108* 122 143* 102  BUN 18 15 17 19  16.4 22.7 20.8  CALCIUM 7.9* 7.2* 7.7* 7.6* 8.4 8.3* 8.6  CREATININE 1.28* 1.18 1.26* 1.19 1.2 1.3 1.3  GFRNONAA 50* 55* 51* 54*  --   --   --   GFRAA 58* >60 59* >60  --   --   --     LIVER FUNCTION TESTS:  Recent Labs  07/26/15 1731 08/17/15 1450 09/14/15 1322 10/13/15 1042  BILITOT 0.4 0.24 0.32 0.31  AST 27 31 28 23   ALT 28 32 35 29  ALKPHOS 100 101 105 90  PROT 5.3* 6.9 6.8 6.6  ALBUMIN 1.9* 2.5* 2.8* 3.0*    TUMOR MARKERS: No results for input(s): AFPTM, CEA, CA199, CHROMGRNA in the last 8760 hours.  Assessment and Plan:  Evaluation of the right lower quadrant abscess drain confirms a persistent fistula to the cecum with decompression of the abscess cavity. Continued drainage is recommended. Antibiotics could be ceased at this point.    Signed: Shriyans Kuenzi, ART A 10/14/2015, 1:23 PM   I spent a total of 15 Minutes in face to face in clinical consultation, greater than 50% of which was counseling/coordinating care for appendiceal abscess drain.

## 2015-10-27 DIAGNOSIS — Z8619 Personal history of other infectious and parasitic diseases: Secondary | ICD-10-CM | POA: Diagnosis not present

## 2015-10-27 DIAGNOSIS — Z8509 Personal history of malignant neoplasm of other digestive organs: Secondary | ICD-10-CM | POA: Diagnosis not present

## 2015-10-27 DIAGNOSIS — K651 Peritoneal abscess: Secondary | ICD-10-CM | POA: Diagnosis not present

## 2015-10-27 DIAGNOSIS — R188 Other ascites: Secondary | ICD-10-CM | POA: Diagnosis not present

## 2015-10-27 DIAGNOSIS — I2510 Atherosclerotic heart disease of native coronary artery without angina pectoris: Secondary | ICD-10-CM | POA: Diagnosis not present

## 2015-11-04 IMAGING — RF DG SINUS / FISTULA TRACT / ABSCESSOGRAM
2 series · 2 of 2 positions shown · IV contrast (agent unspecified)
Comparison: Fluoroscopy injection 08/19/2015.  CT 08/05/2015

CLINICAL DATA: 86-year-old male with a history of ruptured
appendicitis and subsequent drainage

EXAM:
ABSCESS INJECTION
CONTRAST:  5 cc contrast
FLUOROSCOPY TIME:  Fluoroscopy Time (in minutes and seconds): 18
seconds

[Series 1: run · 1 of 1 slices shown (1 of 2)]
[im 1/1]
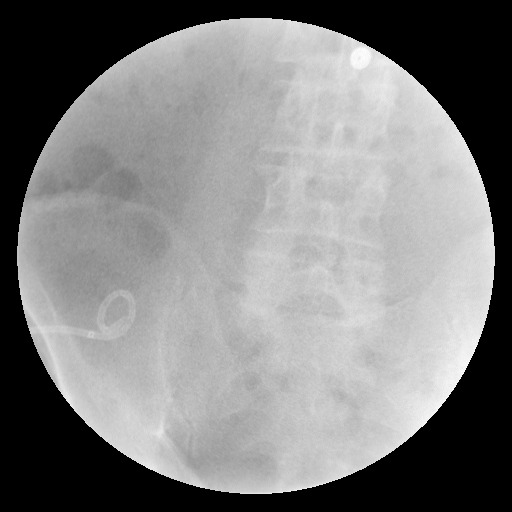

[Series 2: run · 1 of 1 slices shown (2 of 2)]
[im 1/1]
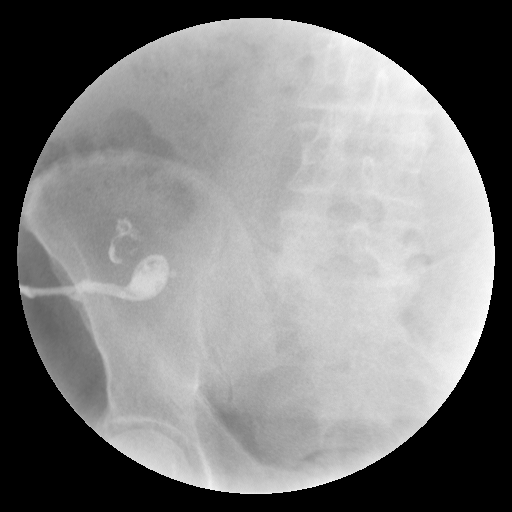

[2 of 2 positions shown; findings below may reference images not displayed]

FINDINGS: Scout image demonstrates pigtail catheter overlying the right iliac
bone.

Injection of small amount of contrast reveals no large abscess
cavity with small amount of contrast limited to the pigtail catheter
drain.

There is a small amount of contrast tracking superiorly from the
drain, with no definite colonic loops opacified. The majority of the
contrast was evacuated through the percutaneous access site adjacent
to the tube.
IMPRESSION: Status post right lower quadrant drain injection demonstrates no
persisting abscess cavity.

It is uncertain whether the fistulous connection persists, as only a
small amount of contrast tract superiorly from the drain, and
further pressurized injection resulted in contrast evacuating
through the skin access site adjacent to the tube.

PLAN:
The patient reports he has an upcoming surgery appointment midway
through [place], which we advised that he observed.

Would not inject this tube before 1 month from now for recheck.

## 2015-11-17 ENCOUNTER — Ambulatory Visit (HOSPITAL_BASED_OUTPATIENT_CLINIC_OR_DEPARTMENT_OTHER): Payer: Medicare Other | Admitting: Oncology

## 2015-11-17 ENCOUNTER — Telehealth: Payer: Self-pay | Admitting: Oncology

## 2015-11-17 ENCOUNTER — Ambulatory Visit: Payer: Medicare Other | Admitting: Nurse Practitioner

## 2015-11-17 ENCOUNTER — Other Ambulatory Visit (HOSPITAL_BASED_OUTPATIENT_CLINIC_OR_DEPARTMENT_OTHER): Payer: Medicare Other

## 2015-11-17 VITALS — BP 158/57 | HR 72 | Temp 98.2°F | Resp 18 | Ht 67.0 in | Wt 163.7 lb

## 2015-11-17 DIAGNOSIS — C49A3 Gastrointestinal stromal tumor of small intestine: Secondary | ICD-10-CM

## 2015-11-17 LAB — CBC WITH DIFFERENTIAL/PLATELET
BASO%: 0.2 % (ref 0.0–2.0)
Basophils Absolute: 0 10*3/uL (ref 0.0–0.1)
EOS%: 4.2 % (ref 0.0–7.0)
Eosinophils Absolute: 0.2 10*3/uL (ref 0.0–0.5)
HEMATOCRIT: 28.7 % — AB (ref 38.4–49.9)
HEMOGLOBIN: 9.5 g/dL — AB (ref 13.0–17.1)
LYMPH#: 1.1 10*3/uL (ref 0.9–3.3)
LYMPH%: 23.6 % (ref 14.0–49.0)
MCH: 34.8 pg — ABNORMAL HIGH (ref 27.2–33.4)
MCHC: 33.1 g/dL (ref 32.0–36.0)
MCV: 105.1 fL — ABNORMAL HIGH (ref 79.3–98.0)
MONO#: 0.4 10*3/uL (ref 0.1–0.9)
MONO%: 8.4 % (ref 0.0–14.0)
NEUT%: 63.6 % (ref 39.0–75.0)
NEUTROS ABS: 2.9 10*3/uL (ref 1.5–6.5)
Platelets: 186 10*3/uL (ref 140–400)
RBC: 2.73 10*6/uL — ABNORMAL LOW (ref 4.20–5.82)
RDW: 14.1 % (ref 11.0–14.6)
WBC: 4.5 10*3/uL (ref 4.0–10.3)

## 2015-11-17 LAB — COMPREHENSIVE METABOLIC PANEL (CC13)
ALT: 26 U/L (ref 0–55)
AST: 29 U/L (ref 5–34)
Albumin: 3.2 g/dL — ABNORMAL LOW (ref 3.5–5.0)
Alkaline Phosphatase: 87 U/L (ref 40–150)
Anion Gap: 5 mEq/L (ref 3–11)
BILIRUBIN TOTAL: 0.5 mg/dL (ref 0.20–1.20)
BUN: 16.9 mg/dL (ref 7.0–26.0)
CALCIUM: 8.6 mg/dL (ref 8.4–10.4)
CHLORIDE: 107 meq/L (ref 98–109)
CO2: 29 mEq/L (ref 22–29)
CREATININE: 1.2 mg/dL (ref 0.7–1.3)
EGFR: 56 mL/min/{1.73_m2} — ABNORMAL LOW (ref >90)
Glucose: 95 mg/dl (ref 70–140)
Potassium: 3.6 mEq/L (ref 3.5–5.1)
Sodium: 141 mEq/L (ref 136–145)
TOTAL PROTEIN: 6.7 g/dL (ref 6.4–8.3)

## 2015-11-17 NOTE — Telephone Encounter (Signed)
Gave and pritned appt sched and avs fo rpt for NOV and DEC...the patient wanted all labs on the same day

## 2015-11-17 NOTE — Progress Notes (Signed)
[facility] OFFICE PROGRESS NOTE   Diagnosis: Gastrointestinal stromal tumor  INTERVAL HISTORY:   [name] [name] returns as scheduled. He continues [place]. No diarrhea or rash. There is been a slight increase in the abdominal distention. He continues follow-up with Dr. [name] and interventional radiology for management of the right lower quadrant drain. He reports minimal drainage. He is scheduled for an appointment in radiology tomorrow.  He is exercising regularly.  Objective:  Vital signs in last 24 hours:  Blood pressure 158/57, pulse 72, temperature 98.2 F (36.8 C), temperature source Oral, resp. rate 18, height 5' 7"  (1.702 m), weight 163 lb 11.2 oz (74.254 kg), SpO2 99 %.    HEENT: No thrush Resp: Lungs clear bilaterally Cardio: Regular rate and rhythm GI: Distended with ascites, nontender, right lower quadrant drain site without evidence of infection, no mass Vascular: Trace pitting edema at the lower leg bilaterally  Skin: No rash     Lab Results:  Lab Results  Component Value Date   WBC 4.5 11/17/2015   HGB 9.5* 11/17/2015   HCT 28.7* 11/17/2015   MCV 105.1* 11/17/2015   PLT 186 11/17/2015   NEUTROABS 2.9 11/17/2015    Potassium 3.6, creatinine 1.2, albumin 3.2   Medications: I have reviewed the patient's current medications.  Assessment/Plan: 1. 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 History of anemia, status post a nondiagnostic bone marrow biopsy 10/21/2014  3. NSTEMI March 2014  4. Admission  05/06/2015 with acute onset right abdomen pain-potentially related to acute appendicitis versus pain from carcinomatosis  CT 05/13/2015 consistent with a right lower abdomen abscess, status post catheter drainage by interventional radiology 05/13/2015  Follow-up CT 05/19/2015 showed resolution of the abscess.  Follow-up evaluation in interventional radiology 06/01/2015 showed the abscess cavity had resolved. The abscess drainage catheter communicated with the cecum via the appendix. The catheter was left in place.  CT abdomen/pelvis 06/08/2015 showed the right pelvic drain in place without recurrent or residual surrounding fluid collection. Peritoneal metastasis with slight increase in small to moderate volume of abdominal pelvic ascites.  Drainage catheter injection 06/12/2015 showed residual tiny fistulous connection with the end of the decompressed abscess cavity within the right lower abdominal quadrant and the residual lumen of the appendix.  Paracentesis 06/12/2015 with 5 liters of fluid removed  Paracentesis 06/19/2015-negative cytology and culture  Removal of abscess drainage catheter 06/23/2015  CT 07/26/2015 with increased right abdominal wall inflammation and increased fluid collection at site of previous right abdomen abscess, ascites, and enlargement/inflammation of the appendix  Placement of right lower quadrant percutaneous drainage catheter 07/27/2015. Contrast injection confirmed persistent fistulous connection with the ill-defined fluid collection and the residual appendix and cecum. Paracentesis with 2 L of fluid removed.  Follow-up CT abdomen/pelvis 08/05/2015 with small to moderate left pleural effusion and moderate to large volume ascites with both appearing slightly decreased since the prior study. Right lower quadrant drainage catheter in place. No residual fluid collection around the drain or in the adjacent abdomen or pelvis. 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 Drainage catheter left in place.  5. C. difficile colitis 05/10/2015  Disposition:  [name] [name] appears stable. There is no clinical evidence for progression of the gastrointestinal stromal tumor. He will continue Gleevec. He will contact us if the ascites is more bothersome and we will arrange for a therapeutic paracentesis.  He is scheduled for an evaluation of the right lower quadrant fistula tomorrow.  [name] [name] will return for an office and lab visit in one month.  Betsy Coder, MD  11/17/2015  1:11 PM

## 2015-11-18 ENCOUNTER — Ambulatory Visit
Admission: RE | Admit: 2015-11-18 | Discharge: 2015-11-18 | Disposition: A | Discharge status: Home / Self Care | Payer: Medicare Other | Source: Ambulatory Visit | Attending: Interventional Radiology | Admitting: Interventional Radiology

## 2015-11-18 DIAGNOSIS — K651 Peritoneal abscess: Secondary | ICD-10-CM

## 2015-11-18 DIAGNOSIS — K632 Fistula of intestine: Secondary | ICD-10-CM | POA: Diagnosis not present

## 2015-11-18 DIAGNOSIS — K353 Acute appendicitis with localized peritonitis: Secondary | ICD-10-CM | POA: Diagnosis not present

## 2015-11-18 NOTE — Progress Notes (Signed)
    Chief Complaint: RLQ abscess drain  Subjective: Pt returns for follow up of RLQ drain, with known fistulous communication to bowel He is here for a repeat drain injection. Has had minimal output from drain. No other c/o. Followed by Dr. [name]  Allergies: Review of patient's allergies indicates no known allergies.  Medications: Prior to Admission medications   Medication Sig Start Date End Date Taking? Authorizing Provider  amLODipine (NORVASC) 5 MG tablet Take 5 mg by mouth daily. 03/17/15   Historical Provider, MD  furosemide (LASIX) 20 MG tablet Take 1 tablet (20 mg total) by mouth daily. 05/20/15   Hosie Poisson, MD  GLEEVEC 400 MG tablet TAKE 1 TABLET BY MOUTH DAILY. TAKE WITH MEALS AND LARGE GLASS OF WATER. CAUTION:CHEMOTHERAPY. 09/11/15   Ladell Pier, MD  HYDROcodone-acetaminophen (NORCO/VICODIN) 5-325 MG per tablet Take 1 tablet by mouth every 4 (four) hours as needed for moderate pain. 07/29/15   Debbe Odea, MD  metoprolol tartrate (LOPRESSOR) 25 MG tablet Take 1 tablet (25 mg total) by mouth 2 (two) times daily. 03/06/13   Costin Karlyne Greenspan, MD  saccharomyces boulardii (FLORASTOR) 250 MG capsule Take 1 capsule (250 mg total) by mouth 2 (two) times daily. 07/29/15   Debbe Odea, MD  simvastatin (ZOCOR) 20 MG tablet Take 20 mg by mouth every evening.    Historical Provider, MD     Vital Signs: BP 149/65 mmHg  Pulse 65  Temp(Src) 97.7 F (36.5 C) (Oral)  SpO2 98%  Physical Exam  Abdominal:  RLQ drain intact, site clean. Developing some proud flesh at drain insertion site, but no erythema or tenderness. Scant drain output    Imaging: No results found.  Labs:  CBC:  Recent Labs 11/17/15 1113  4.5  9.5*  28.7*  186     Assessment and Plan: Perf appendicitis with abscess s/p perc drain originally placed 05/13/15 Drain injection today again shows persistent fistulous communication to bowel in RLQ Contacted Dr. Darrel Hoover office and plan to set up  procedure to exchange drain for smaller size as well as retract drain some, hopefully this will encourage closure of the fistulous tract. Discussed with pt and wife.  SignedAscencion Dike 11/18/2015, 11:36 AM

## 2015-12-18 ENCOUNTER — Other Ambulatory Visit (HOSPITAL_COMMUNITY): Payer: Self-pay | Admitting: Interventional Radiology

## 2015-12-18 ENCOUNTER — Ambulatory Visit (HOSPITAL_BASED_OUTPATIENT_CLINIC_OR_DEPARTMENT_OTHER): Payer: Medicare Other | Admitting: Oncology

## 2015-12-18 ENCOUNTER — Telehealth: Payer: Self-pay | Admitting: *Deleted

## 2015-12-18 ENCOUNTER — Telehealth: Payer: Self-pay | Admitting: Oncology

## 2015-12-18 ENCOUNTER — Other Ambulatory Visit (HOSPITAL_BASED_OUTPATIENT_CLINIC_OR_DEPARTMENT_OTHER): Payer: Medicare Other

## 2015-12-18 ENCOUNTER — Ambulatory Visit (HOSPITAL_COMMUNITY)
Admission: RE | Admit: 2015-12-18 | Discharge: 2015-12-18 | Disposition: A | Discharge status: Home / Self Care | Payer: Medicare Other | Source: Ambulatory Visit | Attending: Oncology | Admitting: Oncology

## 2015-12-18 VITALS — BP 148/51 | HR 73 | Temp 98.4°F | Resp 19 | Wt 164.8 lb

## 2015-12-18 DIAGNOSIS — C49A3 Gastrointestinal stromal tumor of small intestine: Secondary | ICD-10-CM

## 2015-12-18 DIAGNOSIS — M7989 Other specified soft tissue disorders: Secondary | ICD-10-CM | POA: Diagnosis not present

## 2015-12-18 DIAGNOSIS — R197 Diarrhea, unspecified: Secondary | ICD-10-CM | POA: Diagnosis not present

## 2015-12-18 DIAGNOSIS — Z9889 Other specified postprocedural states: Secondary | ICD-10-CM

## 2015-12-18 LAB — CBC WITH DIFFERENTIAL/PLATELET
BASO%: 0.2 % (ref 0.0–2.0)
BASOS ABS: 0 10*3/uL (ref 0.0–0.1)
EOS%: 2.7 % (ref 0.0–7.0)
Eosinophils Absolute: 0.1 10*3/uL (ref 0.0–0.5)
HCT: 29.4 % — ABNORMAL LOW (ref 38.4–49.9)
HGB: 9.8 g/dL — ABNORMAL LOW (ref 13.0–17.1)
LYMPH%: 27.1 % (ref 14.0–49.0)
MCH: 35.5 pg — AB (ref 27.2–33.4)
MCHC: 33.3 g/dL (ref 32.0–36.0)
MCV: 106.5 fL — AB (ref 79.3–98.0)
MONO#: 0.4 10*3/uL (ref 0.1–0.9)
MONO%: 9.9 % (ref 0.0–14.0)
NEUT#: 2.5 10*3/uL (ref 1.5–6.5)
NEUT%: 60.1 % (ref 39.0–75.0)
Platelets: 199 10*3/uL (ref 140–400)
RBC: 2.76 10*6/uL — ABNORMAL LOW (ref 4.20–5.82)
RDW: 13.7 % (ref 11.0–14.6)
WBC: 4.1 10*3/uL (ref 4.0–10.3)
lymph#: 1.1 10*3/uL (ref 0.9–3.3)

## 2015-12-18 LAB — COMPREHENSIVE METABOLIC PANEL
ALT: 19 U/L (ref 0–55)
AST: 24 U/L (ref 5–34)
Albumin: 3.5 g/dL (ref 3.5–5.0)
Alkaline Phosphatase: 92 U/L (ref 40–150)
Anion Gap: 7 mEq/L (ref 3–11)
BUN: 16.3 mg/dL (ref 7.0–26.0)
CHLORIDE: 110 meq/L — AB (ref 98–109)
CO2: 27 mEq/L (ref 22–29)
Calcium: 8.4 mg/dL (ref 8.4–10.4)
Creatinine: 1.2 mg/dL (ref 0.7–1.3)
EGFR: 53 mL/min/{1.73_m2} — AB (ref >90)
GLUCOSE: 100 mg/dL (ref 70–140)
POTASSIUM: 4.1 meq/L (ref 3.5–5.1)
SODIUM: 144 meq/L (ref 136–145)
Total Bilirubin: 0.54 mg/dL (ref 0.20–1.20)
Total Protein: 7.1 g/dL (ref 6.4–8.3)

## 2015-12-18 NOTE — Telephone Encounter (Signed)
Received call report from [facility] in Vascular Lab: Negative for DVT. Pt was discharged to home.

## 2015-12-18 NOTE — Progress Notes (Signed)
[place] OFFICE PROGRESS NOTE   Diagnosis:  Gastrointestinal stromal tumor  INTERVAL HISTORY:    [name] [name] returns as scheduled. He continues [place]. He denies mouth sores and diarrhea. He has noted increased leg swelling and would like to have a paracentesis. He was seen by interventional radiology on 11/18/2015 and the plan is to slowly remove the drainage catheter. This has not been scheduled.  Objective:  Vital signs in last 24 hours:  Blood pressure 148/51, pulse 73, temperature 98.4 F (36.9 C), temperature source Oral, resp. rate 19, weight 164 lb 12.8 oz (74.753 kg), SpO2 99 %.    HEENT:  No thrush or ulcers Resp:  Lungs clear bilaterally Cardio:  Regular rate and rhythm GI:  The abdomen is distended with ascites, no mass, nontender, right abdomen drain with a gauze dressing Vascular:  1+ edema throughout the right leg, no tenderness or erythema. Trace edema at the left lower leg   Lab Results:  Lab Results  Component Value Date   WBC 4.1 12/18/2015   HGB 9.8* 12/18/2015   HCT 29.4* 12/18/2015   MCV 106.5* 12/18/2015   PLT 199 12/18/2015   NEUTROABS 2.5 12/18/2015    potassium 4.1, BUN 16.3, creatinine 1.2  Medications: I have reviewed the patient's current medications.  Assessment/Plan: 1. 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 History of anemia, status post a nondiagnostic bone marrow biopsy 10/21/2014  3. NSTEMI March 2014  4. Admission 05/06/2015 with acute onset right abdomen pain-potentially  related to acute appendicitis versus pain from carcinomatosis  CT 05/13/2015 consistent with a right lower abdomen abscess, status post catheter drainage by interventional radiology 05/13/2015  Follow-up CT 05/19/2015 showed resolution of the abscess.  Follow-up evaluation in interventional radiology 06/01/2015 showed the abscess cavity had resolved. The abscess drainage catheter communicated with the cecum via the appendix. The catheter was left in place.  CT abdomen/pelvis 06/08/2015 showed the right pelvic drain in place without recurrent or residual surrounding fluid collection. Peritoneal metastasis with slight increase in small to moderate volume of abdominal pelvic ascites.  Drainage catheter injection 06/12/2015 showed residual tiny fistulous connection with the end of the decompressed abscess cavity within the right lower abdominal quadrant and the residual lumen of the appendix.  Paracentesis 06/12/2015 with 5 liters of fluid removed  Paracentesis 06/19/2015-negative cytology and culture  Removal of abscess drainage catheter 06/23/2015  CT 07/26/2015 with increased right abdominal wall inflammation and increased fluid collection at site of previous right abdomen abscess, ascites, and enlargement/inflammation of the appendix  Placement of right lower quadrant percutaneous drainage catheter 07/27/2015. Contrast injection confirmed persistent fistulous connection with the ill-defined fluid collection and the residual appendix and cecum. Paracentesis with 2 L of fluid removed.  Follow-up CT abdomen/pelvis 08/05/2015 with small to moderate left pleural effusion and moderate to large volume ascites with both appearing slightly decreased since the prior study. Right lower quadrant drainage catheter in place. No residual fluid collection around the drain or in the adjacent abdomen or pelvis. 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 Drainage  catheter left in place.  5. C. difficile colitis 05/10/2015  Disposition:   there is no evidence of progressive gastrointestinal stromal tumor. [name] [name] will continue Gleevec. He will be referred for a palliative paracentesis. The right leg is significantly swollen compared to the left side. We will refer him for a Doppler today.   I contacted interventional radiology and they will schedule him for the drain retraction.   [name] [name] will return for an office visit in one month.  [name] Coder, MD  12/18/2015  1:00 PM

## 2015-12-18 NOTE — Progress Notes (Signed)
VASCULAR LAB PRELIMINARY  PRELIMINARY  PRELIMINARY  PRELIMINARY  Right lower extremity venous duplex completed.    Preliminary report:  Right:  No evidence of DVT, superficial thrombosis, or Baker's cyst.  Cinsere Mizrahi, RVT 12/18/2015, 1:54 PM

## 2015-12-18 NOTE — Telephone Encounter (Signed)
Gave patient avs report and appointments for January. [facility] will call re paracentesis - patient aware.

## 2015-12-24 ENCOUNTER — Other Ambulatory Visit (HOSPITAL_COMMUNITY): Payer: Self-pay | Admitting: Interventional Radiology

## 2015-12-24 ENCOUNTER — Other Ambulatory Visit: Payer: Self-pay | Admitting: General Surgery

## 2015-12-24 ENCOUNTER — Ambulatory Visit (HOSPITAL_COMMUNITY)
Admission: RE | Admit: 2015-12-24 | Discharge: 2015-12-24 | Disposition: A | Discharge status: Home / Self Care | Payer: Medicare Other | Source: Ambulatory Visit | Attending: Interventional Radiology | Admitting: Interventional Radiology

## 2015-12-24 DIAGNOSIS — K3533 Acute appendicitis with perforation and localized peritonitis, with abscess: Secondary | ICD-10-CM

## 2015-12-24 DIAGNOSIS — Z9889 Other specified postprocedural states: Secondary | ICD-10-CM

## 2015-12-24 DIAGNOSIS — Z4803 Encounter for change or removal of drains: Secondary | ICD-10-CM | POA: Diagnosis not present

## 2015-12-24 DIAGNOSIS — K632 Fistula of intestine: Secondary | ICD-10-CM | POA: Diagnosis not present

## 2015-12-24 MED ORDER — IOHEXOL 300 MG/ML  SOLN
5.0000 mL | Freq: Once | INTRAMUSCULAR | Status: AC | PRN
  Administered 2015-12-24: 5 mL

## 2015-12-24 MED ORDER — LIDOCAINE HCL 1 % IJ SOLN
INTRAMUSCULAR | Status: AC
  Filled 2015-12-24: qty 20

## 2015-12-28 ENCOUNTER — Ambulatory Visit (HOSPITAL_COMMUNITY): Payer: Medicare Other

## 2015-12-29 ENCOUNTER — Other Ambulatory Visit: Payer: Self-pay | Admitting: Nurse Practitioner

## 2015-12-29 ENCOUNTER — Ambulatory Visit (HOSPITAL_COMMUNITY): Admission: RE | Admit: 2015-12-29 | Payer: Medicare Other | Source: Ambulatory Visit

## 2015-12-29 ENCOUNTER — Telehealth: Payer: Self-pay | Admitting: Nurse Practitioner

## 2015-12-29 DIAGNOSIS — J45909 Unspecified asthma, uncomplicated: Secondary | ICD-10-CM | POA: Diagnosis not present

## 2015-12-29 DIAGNOSIS — J209 Acute bronchitis, unspecified: Secondary | ICD-10-CM | POA: Diagnosis not present

## 2015-12-29 DIAGNOSIS — R197 Diarrhea, unspecified: Secondary | ICD-10-CM | POA: Diagnosis not present

## 2015-12-29 NOTE — Telephone Encounter (Signed)
Mailed letter to patient. Made patient aware of upcoming appointment.       [facility]. °

## 2016-01-01 ENCOUNTER — Ambulatory Visit (HOSPITAL_COMMUNITY)
Admission: RE | Admit: 2016-01-01 | Discharge: 2016-01-01 | Disposition: A | Discharge status: Home / Self Care | Payer: Medicare Other | Source: Ambulatory Visit | Attending: Oncology | Admitting: Oncology

## 2016-01-01 DIAGNOSIS — C49A3 Gastrointestinal stromal tumor of small intestine: Secondary | ICD-10-CM | POA: Diagnosis not present

## 2016-01-01 DIAGNOSIS — R188 Other ascites: Secondary | ICD-10-CM | POA: Diagnosis present

## 2016-01-01 NOTE — Procedures (Signed)
US guided therapeutic paracentesis performed yielding 400 cc yellow fluid. No immediate complications.

## 2016-01-07 ENCOUNTER — Other Ambulatory Visit: Payer: Medicare Other

## 2016-01-12 ENCOUNTER — Other Ambulatory Visit (HOSPITAL_COMMUNITY): Payer: Self-pay | Admitting: Interventional Radiology

## 2016-01-12 ENCOUNTER — Ambulatory Visit
Admission: RE | Admit: 2016-01-12 | Discharge: 2016-01-12 | Disposition: A | Discharge status: Home / Self Care | Payer: Medicare Other | Source: Ambulatory Visit | Attending: Interventional Radiology | Admitting: Interventional Radiology

## 2016-01-12 ENCOUNTER — Ambulatory Visit
Admission: RE | Admit: 2016-01-12 | Discharge: 2016-01-12 | Disposition: A | Discharge status: Home / Self Care | Payer: Medicare Other | Source: Ambulatory Visit | Attending: General Surgery | Admitting: General Surgery

## 2016-01-12 DIAGNOSIS — K3533 Acute appendicitis with perforation and localized peritonitis, with abscess: Secondary | ICD-10-CM

## 2016-01-12 DIAGNOSIS — K632 Fistula of intestine: Secondary | ICD-10-CM | POA: Diagnosis not present

## 2016-01-12 MED FILL — GLEEVEC 400 MG TABLET: 400 | 30 days supply | Qty: 30 | Fill #4

## 2016-01-12 NOTE — Progress Notes (Signed)
Chief Complaint: Patient was seen in consultation today for abscess drain management at the request of Dr. [name]  Referring Physician(s): Marygrace Drought  History of Present Illness: [name] [name] is a 82 y.o. male with a history of perforated appendicitis treated by percutaneous drain placement. During place and has been, located by a persistent fistulous communication with the appendiceal stump. Most recently, he had exchange of drainage catheter for a smaller caliber Bettey Mare catheter on 12/24/2015.    Since that time, his catheter output has significantly decreased. In fact, the last 7 days he has had almost no output. He presents today for repeat drain injection to assess closure of the fistula.   He continues to flush the catheter daily in the evenings.  Past Medical History  Diagnosis Date  . Hyperlipidemia     TAKES zOCOR DAILY  . Abdominal mass 02/2013  . UTI (urinary tract infection)   . Hypertension     TAKES LOTREL,HCTZ,AND METOPROLOL DAILY  . NSTEMI (non-ST elevated myocardial infarction) 02/2013    "LIGHT: ONE MD SAID HE DID AND ONE SAID HE DIDN'T  . Pneumonia     MAR 2014  . Stromal tumor of digestive system   . CAD (coronary artery disease)   . Shortness of breath   . Acute respiratory failure with hypoxia 09/12/2014  . Hemorrhagic shock 09/13/2014  . Intraperitoneal hemorrhage 09/17/2014  . CKD (chronic kidney disease), stage III     Past Surgical History  Procedure Laterality Date  . Cyst excision  1962    wrist  . Cardiac catheterization  03-05-13  . Laparotomy N/A 06/04/2013    Procedure: EXPLORATORY LAPAROTOMY, OPEN RESECTION OF MESENTERIC AND INTESTINAL MASS;  Surgeon: [name], MD;  Location: [facility];  Service: General;  Laterality: N/A;  Small Bowel Resection  . Colonoscopy  09/08/2014    dr scooler  . Left heart catheterization with coronary angiogram N/A 03/05/2013    Procedure: LEFT HEART CATHETERIZATION WITH CORONARY ANGIOGRAM;   Surgeon: [name], MD;  Location: [facility] CATH LAB;  Service: Cardiovascular;  Laterality: N/A;    Allergies: Review of patient's allergies indicates no known allergies.  Medications: Prior to Admission medications   Medication Sig Start Date End Date Taking? Authorizing Provider  amLODipine (NORVASC) 5 MG tablet Take 5 mg by mouth daily. 03/17/15   Historical Provider, MD  furosemide (LASIX) 20 MG tablet Take 1 tablet (20 mg total) by mouth daily. 05/20/15   Hosie Poisson, MD  GLEEVEC 400 MG tablet TAKE 1 TABLET BY MOUTH DAILY. TAKE WITH MEALS AND LARGE GLASS OF WATER. CAUTION:CHEMOTHERAPY. 09/11/15   Ladell Pier, MD  HYDROcodone-acetaminophen (NORCO/VICODIN) 5-325 MG per tablet Take 1 tablet by mouth every 4 (four) hours as needed for moderate pain. 07/29/15   Debbe Odea, MD  metoprolol tartrate (LOPRESSOR) 25 MG tablet Take 1 tablet (25 mg total) by mouth 2 (two) times daily. 03/06/13   Costin Karlyne Greenspan, MD  saccharomyces boulardii (FLORASTOR) 250 MG capsule Take 1 capsule (250 mg total) by mouth 2 (two) times daily. 07/29/15   Debbe Odea, MD  simvastatin (ZOCOR) 20 MG tablet Take 20 mg by mouth every evening.    Historical Provider, MD     Family History  Problem Relation Age of Onset  . Skin cancer Brother   . Stroke Mother   . Skin cancer Father     Social History   Social History  . Marital Status: Married    Spouse Name:  [name]  . Number of Children: 2  . Years of Education: N/A   Occupational History  . Retired     Writer   Social History Main Topics  . Smoking status: Never Smoker   . Smokeless tobacco: Never Used  . Alcohol Use: No  . Drug Use: No  . Sexual Activity: No   Other Topics Concern  . Not on file   Social History Narrative   Married, wife [name]   Retired Environmental manager for 26 years   Independent in [place]   #2 grown sons    Review of Systems: A 12 point ROS discussed and pertinent positives are indicated in the HPI  above.  All other systems are negative.  Review of Systems  Vital Signs: BP 145/62 mmHg  Pulse 99  Temp(Src) 98.2 F (36.8 C) (Oral)  Physical Exam  Constitutional: He is oriented to person, place, and time.  HENT:  Head: Normocephalic and atraumatic.  Eyes: No scleral icterus.  Cardiovascular: Normal rate.   Abdominal: Soft.    Neurological: He is alert and oriented to person, place, and time.  Skin: Skin is warm and dry.  Psychiatric: He has a normal mood and affect. His behavior is normal.  Nursing note and vitals reviewed.   Mallampati Score:     Imaging: Ir Catheter Tube Change  12/24/2015  CLINICAL DATA:  82 year old male with a prolonged persistent fistulous connection between a percutaneous drainage catheter in the appendix. He presents today for down size to a smaller [employer] tube and repositioning of the drainage catheter slightly farther away from the fistulous connection in an effort to promote closure. EXAM: IR CATHETER TUBE CHANGE Date: 12/24/2015 PROCEDURE: 1. Tube injection under fluoroscopy 2. Tube exchange Interventional Radiologist:  Criselda Peaches, MD ANESTHESIA/SEDATION: None required MEDICATIONS: None FLUOROSCOPY TIME:  24 seconds for a total of 8 mGy CONTRAST:  7 mL Omnipaque 300 TECHNIQUE: Informed consent was obtained from the patient following explanation of the procedure, risks, benefits and alternatives. The patient understands, agrees and consents for the procedure. All questions were addressed. A time out was performed. Maximal barrier sterile technique utilized including caps, mask, sterile gowns, sterile gloves, large sterile drape, hand hygiene, and Betadine skin prep. A gentle hand injection of contrast material was performed through the existing tube. This confirms the presence of the fistulous connection to the appendix. The catheter was cut and removed over a Bentson wire. A new 10 French Dawson Mueller drainage catheter was advanced over  the wire and formed. The catheter was rotated inferiorly slightly further away from the previously noted fistulous connection. The catheter was secured to the skin with 0 Prolene suture. COMPLICATIONS: None IMPRESSION: 1. Persistent fistulous connection with the appendix. 2. Successful exchange for a smaller 10 French Dawson Mueller drainage catheter which is now positioned farther from the fistulous connection. Return to drain clinic in 2 weeks for repeat drain injection and evaluation. Signed, Criselda Peaches, MD [facility] Radiology Electronically Signed   By: [name] M.D.   On: 12/24/2015 16:02   US Paracentesis  01/01/2016  INDICATION: Patient with history of gastrointestinal stromal tumor of small bowel, recurrent ascites. Request is made for therapeutic paracentesis. EXAM: ULTRASOUND-GUIDED THERAPEUTIC PARACENTESIS COMPARISON:  Prior paracentesis on 09/11/2015 MEDICATIONS: None. COMPLICATIONS: None immediate TECHNIQUE: Informed written consent was obtained from the patient after a discussion of the risks, benefits and alternatives to treatment. A timeout was performed prior  to the initiation of the procedure. Initial ultrasound scanning demonstrates a small amount of ascites within the left mid to lower abdominal quadrant. The left mid to lower abdomen was prepped and draped in the usual sterile fashion. 1% lidocaine was used for local anesthesia. Under direct ultrasound guidance, a 19 gauge, 7-cm, Yueh catheter was introduced. An ultrasound image was saved for documentation purposed. The paracentesis was performed. The catheter was removed and a dressing was applied. The patient tolerated the procedure well without immediate post procedural complication. FINDINGS: A total of approximately 400 cc  of yellow fluid was removed. IMPRESSION: Successful ultrasound-guided therapeutic paracentesis yielding 400 cc of peritoneal fluid. Read by: Rowe Robert, PA-C Electronically Signed   By: Aletta Edouard M.D.   On: 01/01/2016 15:19    Labs:  CBC:  Recent Labs  09/14/15 1322 10/13/15 1042 11/17/15 1113 12/18/15 1148  WBC 4.6 5.0 4.5 4.1  HGB 10.1* 9.8* 9.5* 9.8*  HCT 30.1* 28.4* 28.7* 29.4*  PLT 264 226 186 199    COAGS:  Recent Labs  01/13/15 0100 05/06/15 0304 07/26/15 1731  INR 1.05 1.14 1.17  APTT  --   --  27    BMP:  Recent Labs  07/26/15 1222 07/26/15 1731 07/27/15 0514 07/28/15 0555  09/14/15 1322 10/13/15 1042 11/17/15 1113 12/18/15 1148  NA 140 137 138 139  < > 143 143 141 144  K 3.6 3.7 4.0 4.2  < > 3.8 3.7 3.6 4.1  CL 111 113* 112* 112*  --   --   --   --   --   CO2 24 22 23 23   < > 24 24 29 27   GLUCOSE 124* 96 96 108*  < > 143* 102 95 100  BUN 18 15 17 19   < > 22.7 20.8 16.9 16.3  CALCIUM 7.9* 7.2* 7.7* 7.6*  < > 8.3* 8.6 8.6 8.4  CREATININE 1.28* 1.18 1.26* 1.19  < > 1.3 1.3 1.2 1.2  GFRNONAA 50* 55* 51* 54*  --   --   --   --   --   GFRAA 58* >60 59* >60  --   --   --   --   --   < > = values in this interval not displayed.  LIVER FUNCTION TESTS:  Recent Labs  09/14/15 1322 10/13/15 1042 11/17/15 1113 12/18/15 1148  BILITOT 0.32 0.31 0.50 0.54  AST 28 23 29 24   ALT 35 29 26 19   ALKPHOS 105 90 87 92  PROT 6.8 6.6 6.7 7.1  ALBUMIN 2.8* 3.0* 3.2* 3.5    TUMOR MARKERS: No results for input(s): AFPTM, CEA, CA199, CHROMGRNA in the last 8760 hours.  Assessment and Plan:  Although drain output has dwindled to near 0, there is a persistent small fistulous connection on drain injection. The fistula appears significantly smaller than seen on prior imaging.  - Improving fistula status post downsized to smaller [employer] drainage catheter.  - Stop flushing.  - Patient will return to IR drain clinic in 1 month for hopefully final contrast injection and tube removal of the fistula has sealed.  Electronically Signed: [name] 01/12/2016, 2:52 PM   I spent a total  of   10 Minutes in face to face in clinical consultation, greater than 50% of which was counseling/coordinating care for perforated appendiceal abscess .

## 2016-01-15 ENCOUNTER — Telehealth: Payer: Self-pay | Admitting: Nurse Practitioner

## 2016-01-15 ENCOUNTER — Other Ambulatory Visit: Payer: Medicare Other

## 2016-01-15 ENCOUNTER — Other Ambulatory Visit (HOSPITAL_BASED_OUTPATIENT_CLINIC_OR_DEPARTMENT_OTHER): Payer: Medicare Other

## 2016-01-15 ENCOUNTER — Ambulatory Visit: Payer: Medicare Other | Admitting: Nurse Practitioner

## 2016-01-15 ENCOUNTER — Ambulatory Visit (HOSPITAL_BASED_OUTPATIENT_CLINIC_OR_DEPARTMENT_OTHER): Payer: Medicare Other | Admitting: Nurse Practitioner

## 2016-01-15 VITALS — BP 143/55 | HR 67 | Temp 98.3°F | Resp 18 | Ht 67.0 in | Wt 159.9 lb

## 2016-01-15 DIAGNOSIS — C49A3 Gastrointestinal stromal tumor of small intestine: Secondary | ICD-10-CM | POA: Diagnosis not present

## 2016-01-15 DIAGNOSIS — C762 Malignant neoplasm of abdomen: Secondary | ICD-10-CM

## 2016-01-15 LAB — CBC WITH DIFFERENTIAL/PLATELET
BASO%: 0.4 % (ref 0.0–2.0)
Basophils Absolute: 0 10*3/uL (ref 0.0–0.1)
EOS ABS: 0.1 10*3/uL (ref 0.0–0.5)
EOS%: 0.9 % (ref 0.0–7.0)
HEMATOCRIT: 27.8 % — AB (ref 38.4–49.9)
HEMOGLOBIN: 9.2 g/dL — AB (ref 13.0–17.1)
LYMPH#: 0.8 10*3/uL — AB (ref 0.9–3.3)
LYMPH%: 15.8 % (ref 14.0–49.0)
MCH: 35.5 pg — ABNORMAL HIGH (ref 27.2–33.4)
MCHC: 33.1 g/dL (ref 32.0–36.0)
MCV: 107.3 fL — AB (ref 79.3–98.0)
MONO#: 0.5 10*3/uL (ref 0.1–0.9)
MONO%: 9.9 % (ref 0.0–14.0)
NEUT%: 73 % (ref 39.0–75.0)
NEUTROS ABS: 3.9 10*3/uL (ref 1.5–6.5)
PLATELETS: 191 10*3/uL (ref 140–400)
RBC: 2.59 10*6/uL — ABNORMAL LOW (ref 4.20–5.82)
RDW: 13.7 % (ref 11.0–14.6)
WBC: 5.3 10*3/uL (ref 4.0–10.3)

## 2016-01-15 LAB — COMPREHENSIVE METABOLIC PANEL
ALBUMIN: 3.4 g/dL — AB (ref 3.5–5.0)
ALK PHOS: 85 U/L (ref 40–150)
ALT: 24 U/L (ref 0–55)
ANION GAP: 6 meq/L (ref 3–11)
AST: 24 U/L (ref 5–34)
BILIRUBIN TOTAL: 0.45 mg/dL (ref 0.20–1.20)
BUN: 19.6 mg/dL (ref 7.0–26.0)
CO2: 27 mEq/L (ref 22–29)
CREATININE: 1.3 mg/dL (ref 0.7–1.3)
Calcium: 8.3 mg/dL — ABNORMAL LOW (ref 8.4–10.4)
Chloride: 110 mEq/L — ABNORMAL HIGH (ref 98–109)
EGFR: 49 mL/min/{1.73_m2} — AB (ref >90)
Glucose: 95 mg/dl (ref 70–140)
Potassium: 3.7 mEq/L (ref 3.5–5.1)
Sodium: 143 mEq/L (ref 136–145)
TOTAL PROTEIN: 6.8 g/dL (ref 6.4–8.3)

## 2016-01-15 NOTE — Telephone Encounter (Signed)
Pt confirmed labs/ov per 01/27 POF, gave pt AVS and Calendar... KJ

## 2016-01-15 NOTE — Progress Notes (Signed)
[place] OFFICE PROGRESS NOTE   Diagnosis:   Gastrointestinal stromal tumor  INTERVAL HISTORY:   [name] [name] returns as scheduled. He continues [place]. He denies nausea/vomiting. No diarrhea. No rash. He reports stable leg edema. He has intermittent periorbital edema. He reports recent "bronchitis". He has a mild persistent cough. The drainage catheter has minimal output.  Objective:  Vital signs in last 24 hours:  Blood pressure 143/55, pulse 67, temperature 98.3 F (36.8 C), temperature source Oral, resp. rate 18, height 5' 7"  (1.702 m), weight 159 lb 14.4 oz (72.53 kg), SpO2 100 %.    HEENT:  Mild periorbital edema. No thrush or ulcers. Resp:  Scattered inspiratory rhonchi. Cardio:  Regular rate and rhythm. GI:  Abdomen mildly distended. Nontender. No hepatomegaly. Right abdomen drain site is covered with a gauze dressing. Vascular:  Trace edema bilateral legs right greater than left. Skin: No rash.    Lab Results:  Lab Results  Component Value Date   WBC 5.3 01/15/2016   HGB 9.2* 01/15/2016   HCT 27.8* 01/15/2016   MCV 107.3* 01/15/2016   PLT 191 01/15/2016   NEUTROABS 3.9 01/15/2016    Imaging:  No results found.  Medications: I have reviewed the patient's current medications.  Assessment/Plan: 1. 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 History of anemia, status post a nondiagnostic bone marrow biopsy 10/21/2014  3. NSTEMI March 2014  4. Admission 05/06/2015 with acute onset right abdomen pain-potentially  related to acute appendicitis versus pain from carcinomatosis  CT 05/13/2015 consistent with a right lower abdomen abscess, status post catheter drainage by interventional radiology 05/13/2015  Follow-up CT 05/19/2015 showed resolution of the abscess.  Follow-up evaluation in interventional radiology 06/01/2015 showed the abscess cavity had resolved. The abscess drainage catheter communicated with the cecum via the appendix. The catheter was left in place.  CT abdomen/pelvis 06/08/2015 showed the right pelvic drain in place without recurrent or residual surrounding fluid collection. Peritoneal metastasis with slight increase in small to moderate volume of abdominal pelvic ascites.  Drainage catheter injection 06/12/2015 showed residual tiny fistulous connection with the end of the decompressed abscess cavity within the right lower abdominal quadrant and the residual lumen of the appendix.  Paracentesis 06/12/2015 with 5 liters of fluid removed  Paracentesis 06/19/2015-negative cytology and culture  Removal of abscess drainage catheter 06/23/2015  CT 07/26/2015 with increased right abdominal wall inflammation and increased fluid collection at site of previous right abdomen abscess, ascites, and enlargement/inflammation of the appendix  Placement of right lower quadrant percutaneous drainage catheter 07/27/2015. Contrast injection confirmed persistent fistulous connection with the ill-defined fluid collection and the residual appendix and cecum. Paracentesis with 2 L of fluid removed.  Follow-up CT abdomen/pelvis 08/05/2015 with small to moderate left pleural effusion and moderate to large volume ascites with both appearing slightly decreased since the prior study. Right lower quadrant drainage catheter in place. No residual fluid collection around the drain or in the adjacent abdomen or pelvis. 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 Drainage  catheter left in place.  Tube evaluation 01/12/2016. Persistent but improving fistulous connection with the appendiceal stump.  5. C. difficile colitis 05/10/2015  6.  Right leg edema 12/18/2015. Negative venous Doppler.   Disposition: [name] [name] appears stable. He will continue Gleevec.  He will continue follow-up with Dr. [name] and radiology regarding the drain.  We scheduled a return visit in 4 weeks. He will contact the office in the interim with any problems.  Ned Card ANP/GNP-BC   01/15/2016  10:31 AM

## 2016-01-20 DIAGNOSIS — E782 Mixed hyperlipidemia: Secondary | ICD-10-CM | POA: Diagnosis not present

## 2016-01-20 DIAGNOSIS — D509 Iron deficiency anemia, unspecified: Secondary | ICD-10-CM | POA: Diagnosis not present

## 2016-01-20 DIAGNOSIS — I2510 Atherosclerotic heart disease of native coronary artery without angina pectoris: Secondary | ICD-10-CM | POA: Diagnosis not present

## 2016-01-20 DIAGNOSIS — I10 Essential (primary) hypertension: Secondary | ICD-10-CM | POA: Diagnosis not present

## 2016-02-10 ENCOUNTER — Other Ambulatory Visit: Payer: Self-pay | Admitting: Interventional Radiology

## 2016-02-10 ENCOUNTER — Ambulatory Visit
Admission: RE | Admit: 2016-02-10 | Discharge: 2016-02-10 | Disposition: A | Discharge status: Home / Self Care | Payer: Medicare Other | Source: Ambulatory Visit | Attending: Interventional Radiology | Admitting: Interventional Radiology

## 2016-02-10 DIAGNOSIS — K3533 Acute appendicitis with perforation and localized peritonitis, with abscess: Secondary | ICD-10-CM

## 2016-02-10 DIAGNOSIS — K353 Acute appendicitis with localized peritonitis: Secondary | ICD-10-CM | POA: Diagnosis not present

## 2016-02-10 DIAGNOSIS — T8183XD Persistent postprocedural fistula, subsequent encounter: Secondary | ICD-10-CM

## 2016-02-10 DIAGNOSIS — K632 Fistula of intestine: Secondary | ICD-10-CM | POA: Diagnosis not present

## 2016-02-10 MED FILL — GLEEVEC 400 MG TABLET: 400 | 30 days supply | Qty: 30 | Fill #5

## 2016-02-10 NOTE — Consult Note (Signed)
Chief Complaint: Patient was seen in consultation today for No chief complaint on file.  at the request of McCullough,Heath  Referring Physician(s): McCullough,Heath  History of Present Illness: [name] [name] is a 82 y.o. male  with a history of perforated appendicitis treated by percutaneous drain placement. During place and has been, located by a persistent fistulous communication with the appendiceal stump. Most recently, he had exchange of drainage catheter for a smaller caliber Bettey Mare catheter on 12/24/2015. He's not flushing the catheter. No significant output from the drain catheter.  Past Medical History  Diagnosis Date  . Hyperlipidemia     TAKES zOCOR DAILY  . Abdominal mass 02/2013  . UTI (urinary tract infection)   . Hypertension     TAKES LOTREL,HCTZ,AND METOPROLOL DAILY  . NSTEMI (non-ST elevated myocardial infarction) 02/2013    "LIGHT: ONE MD SAID HE DID AND ONE SAID HE DIDN'T  . Pneumonia     MAR 2014  . Stromal tumor of digestive system   . CAD (coronary artery disease)   . Shortness of breath   . Acute respiratory failure with hypoxia 09/12/2014  . Hemorrhagic shock 09/13/2014  . Intraperitoneal hemorrhage 09/17/2014  . CKD (chronic kidney disease), stage [name]     Past Surgical History  Procedure Laterality Date  . Cyst excision  1962    wrist  . Cardiac catheterization  03-05-13  . Laparotomy N/A 06/04/2013    Procedure: EXPLORATORY LAPAROTOMY, OPEN RESECTION OF MESENTERIC AND INTESTINAL MASS;  Surgeon: [name], MD;  Location: [facility];  Service: General;  Laterality: N/A;  Small Bowel Resection  . Colonoscopy  09/08/2014    dr scooler  . Left heart catheterization with coronary angiogram N/A 03/05/2013    Procedure: LEFT HEART CATHETERIZATION WITH CORONARY ANGIOGRAM;  Surgeon: [name], MD;  Location[facility] C[facility] LAB;  Service: Cardiovascular;  Laterality: N/A;    Allergies: Review of patient's allergies indicates no known  allergies.  Medications: Prior to Admission medications   Medication Sig Start Date End Date Taking? Authorizing Provider  amLODipine (NORVASC) 5 MG tablet Take 5 mg by mouth daily. 03/17/15   Historical Provider, MD  furosemide (LASIX) 20 MG tablet Take 1 tablet (20 mg total) by mouth daily. 05/20/15   Hosie Poisson, MD  GLEEVEC 400 MG tablet TAKE 1 TABLET BY MOUTH DAILY. TAKE WITH MEALS AND LARGE GLASS OF WATER. CAUTION:CHEMOTHERAPY. 09/11/15   Ladell Pier, MD  HYDROcodone-acetaminophen (NORCO/VICODIN) 5-325 MG per tablet Take 1 tablet by mouth every 4 (four) hours as needed for moderate pain. 07/29/15   Debbe Odea, MD  metoprolol tartrate (LOPRESSOR) 25 MG tablet Take 1 tablet (25 mg total) by mouth 2 (two) times daily. 03/06/13   Costin Karlyne Greenspan, MD  saccharomyces boulardii (FLORASTOR) 250 MG capsule Take 1 capsule (250 mg total) by mouth 2 (two) times daily. 07/29/15   Debbe Odea, MD  simvastatin (ZOCOR) 20 MG tablet Take 20 mg by mouth every evening.    Historical Provider, MD  VENTOLIN HFA 108 [phone] Base) MCG/ACT inhaler  12/29/15   Historical Provider, MD     Family History  Problem Relation Age of Onset  . Skin cancer Brother   . Stroke Mother   . Skin cancer Father     Social History   Social History  . Marital Status: Married    Spouse Name: [name] [name]  . Number of Children: 2  . Years of Education: N/A   Occupational  History  . Retired     Writer   Social History Main Topics  . Smoking status: Never Smoker   . Smokeless tobacco: Never Used  . Alcohol Use: No  . Drug Use: No  . Sexual Activity: No   Other Topics Concern  . Not on file   Social History Narrative   Married, wife [name] [name]   Retired Environmental manager for 51 years   Independent in [place]   #2 grown sons    ECOG Status: 1 - Symptomatic but completely ambulatory  Review of Systems: A 12 point ROS discussed and pertinent positives are indicated in the HPI above.  All other systems  are negative.  Review of Systems  Vital Signs: BP 166/71 mmHg  Pulse 72  Temp(Src) 98 F (36.7 C) (Oral)  SpO2 99%  Physical Exam  Abdominal: Soft. He exhibits no distension. There is no tenderness.      Mallampati Score:     Imaging: Dg Sinus/fist Tube Chk-non Gi  02/10/2016  CLINICAL DATA:  Appendicitis with abscess. Fistula persists after percutaneous abscess drain catheter placement. The drain catheter has been down sized . EXAM: ABSCESS DRAIN CATHETER INJECTION UNDER FLUOROSCOPY TECHNIQUE: The procedure, risks (including but not limited to bleeding, infection, organ damage ), benefits, and alternatives were explained to the patient. Questions regarding the procedure were encouraged and answered. The patient understands and consents to the procedure. Survey fluoroscopic inspection reveals stable position of the right lower quadrant dawson mueller drain catheter. Injection demonstrates no residual abscess cavity. There is prompt passage of contrast through the residual flipped fistula to the cecum. Some contrast leaks back along the drain catheter with continued injection. IMPRESSION: 1. 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 No residual abscess drain cavity. Findings telephoned to Dr. [name]; Will arrange catheter downsizing/ repositioning more distal to the fistula. Continue gravity drainage as needed. Electronically Signed   By: Lucrezia Europe M.D.   On: 02/10/2016 14:12   Dg Sinus/fist Tube Chk-non Gi  01/12/2016  INDICATION: 82 year old male with a history of perforated appendicitis treated by percutaneous drain placement. Evaluate persistent fistulous connection to the appendix. EXAM: ABSCESS INJECTION MEDICATIONS: The patient is currently admitted to the hospital and receiving intravenous antibiotics. The antibiotics were administered within an appropriate time frame prior to the initiation of the procedure. ANESTHESIA/SEDATION: None COMPLICATIONS: None immediate. PROCEDURE: A gentle hand  injection of contrast material was performed under fluoroscopy. There is a persistent but improving fistulous connection with the appendiceal stump. They connection fills less briskly than before. IMPRESSION: Persistent but improving fistulous connection with the appendiceal stump. Electronically Signed   By: [name] M.D.   On: 01/12/2016 17:08    Labs:  CBC:  Recent Labs  10/13/15 1042 11/17/15 1113 12/18/15 1148 01/15/16 0933  WBC 5.0 4.5 4.1 5.3  HGB 9.8* 9.5* 9.8* 9.2*  HCT 28.4* 28.7* 29.4* 27.8*  PLT 226 186 199 191    COAGS:  Recent Labs  05/06/15 0304 07/26/15 1731  INR 1.14 1.17  APTT  --  27    BMP:  Recent Labs  07/26/15 1222 07/26/15 1731 07/27/15 0514 07/28/15 0555  10/13/15 1042 11/17/15 1113 12/18/15 1148 01/15/16 0933  NA 140 137 138 139  < > 143 141 144 143  K 3.6 3.7 4.0 4.2  < > 3.7 3.6 4.1 3.7  CL 111 113* 112* 112*  --   --   --   --   --   CO2  24 22 23 23   < > 24 29 27 27   GLUCOSE 124* 96 96 108*  < > 102 95 100 95  BUN 18 15 17 19   < > 20.8 16.9 16.3 19.6  CALCIUM 7.9* 7.2* 7.7* 7.6*  < > 8.6 8.6 8.4 8.3*  CREATININE 1.28* 1.18 1.26* 1.19  < > 1.3 1.2 1.2 1.3  GFRNONAA 50* 55* 51* 54*  --   --   --   --   --   GFRAA 58* >60 59* >60  --   --   --   --   --   < > = values in this interval not displayed.  LIVER FUNCTION TESTS:  Recent Labs  10/13/15 1042 11/17/15 1113 12/18/15 1148 01/15/16 0933  BILITOT 0.31 0.50 0.54 0.45  AST 23 29 24 24   ALT 29 26 19 24   ALKPHOS 90 87 92 85  PROT 6.6 6.7 7.1 6.8  ALBUMIN 3.0* 3.2* 3.5 3.4*    TUMOR MARKERS: No results for input(s): AFPTM, CEA, CA199, CHROMGRNA in the last 8760 hours.  Assessment and Plan:  My impression is that despite downsizing the drain catheter, and no further flushing, the fistula persists. To avoid open surgical procedure, we can continue with catheter downsizing as well as positioning the catheter further away from the site of the fistula itself. This  was discussed on the phone with Dr. [name]. We can set him up for catheter exchange and repositioning under fluoroscopy as an outpatient at his convenience.  Thank you for this interesting consult.  I greatly enjoyed meeting [name] [name] and look forward to participating in their care.  A copy of this report was sent to the requesting provider on this date.  Electronically Signed: Elfa Wooton [name], [name] [name] 02/10/2016, 2:52 PM   I spent a total of    25 Minutes in face to face in clinical consultation, greater than 50% of which was counseling/coordinating care for abscess drain catheter and fistula.

## 2016-02-12 ENCOUNTER — Telehealth: Payer: Self-pay | Admitting: Oncology

## 2016-02-12 ENCOUNTER — Other Ambulatory Visit (HOSPITAL_BASED_OUTPATIENT_CLINIC_OR_DEPARTMENT_OTHER): Payer: Medicare Other

## 2016-02-12 ENCOUNTER — Ambulatory Visit (HOSPITAL_BASED_OUTPATIENT_CLINIC_OR_DEPARTMENT_OTHER): Payer: Medicare Other | Admitting: Oncology

## 2016-02-12 VITALS — BP 160/54 | HR 71 | Temp 98.0°F | Resp 18 | Ht 67.0 in | Wt 164.6 lb

## 2016-02-12 DIAGNOSIS — C49A3 Gastrointestinal stromal tumor of small intestine: Secondary | ICD-10-CM | POA: Diagnosis not present

## 2016-02-12 DIAGNOSIS — C762 Malignant neoplasm of abdomen: Secondary | ICD-10-CM

## 2016-02-12 LAB — COMPREHENSIVE METABOLIC PANEL
ALT: 37 U/L (ref 0–55)
ANION GAP: 6 meq/L (ref 3–11)
AST: 34 U/L (ref 5–34)
Albumin: 3.3 g/dL — ABNORMAL LOW (ref 3.5–5.0)
Alkaline Phosphatase: 104 U/L (ref 40–150)
BILIRUBIN TOTAL: 0.45 mg/dL (ref 0.20–1.20)
BUN: 19.2 mg/dL (ref 7.0–26.0)
CHLORIDE: 112 meq/L — AB (ref 98–109)
CO2: 25 meq/L (ref 22–29)
Calcium: 8.5 mg/dL (ref 8.4–10.4)
Creatinine: 1.2 mg/dL (ref 0.7–1.3)
EGFR: 54 mL/min/{1.73_m2} — AB (ref >90)
Glucose: 91 mg/dl (ref 70–140)
POTASSIUM: 4.1 meq/L (ref 3.5–5.1)
SODIUM: 144 meq/L (ref 136–145)
Total Protein: 6.5 g/dL (ref 6.4–8.3)

## 2016-02-12 LAB — CBC WITH DIFFERENTIAL/PLATELET
BASO%: 0.3 % (ref 0.0–2.0)
BASOS ABS: 0 10*3/uL (ref 0.0–0.1)
EOS%: 2.3 % (ref 0.0–7.0)
Eosinophils Absolute: 0.1 10*3/uL (ref 0.0–0.5)
HCT: 29.6 % — ABNORMAL LOW (ref 38.4–49.9)
HGB: 9.8 g/dL — ABNORMAL LOW (ref 13.0–17.1)
LYMPH#: 1 10*3/uL (ref 0.9–3.3)
LYMPH%: 25.2 % (ref 14.0–49.0)
MCH: 35 pg — AB (ref 27.2–33.4)
MCHC: 33.1 g/dL (ref 32.0–36.0)
MCV: 105.7 fL — AB (ref 79.3–98.0)
MONO#: 0.4 10*3/uL (ref 0.1–0.9)
MONO%: 10.8 % (ref 0.0–14.0)
NEUT#: 2.4 10*3/uL (ref 1.5–6.5)
NEUT%: 61.4 % (ref 39.0–75.0)
PLATELETS: 157 10*3/uL (ref 140–400)
RBC: 2.8 10*6/uL — AB (ref 4.20–5.82)
RDW: 13.8 % (ref 11.0–14.6)
WBC: 3.9 10*3/uL — AB (ref 4.0–10.3)

## 2016-02-12 NOTE — Progress Notes (Signed)
[place] OFFICE PROGRESS NOTE   Diagnosis: Gastrointestinal stromal tumor  INTERVAL HISTORY:   [name] returns as scheduled. He continues [name] [name]. No diarrhea or rash. He feels well. Good appetite and energy level. The drainage from the right lower quadrant catheter has diminished. He is scheduled for repositioning of the catheter and downsizing of the catheter next week. He underwent a paracentesis for 400 mL on 01/01/2016.  Objective:  Vital signs in last 24 hours:  Blood pressure 160/54, pulse 71, temperature 98 F (36.7 C), temperature source Oral, resp. rate 18, height 5' 7" (1.702 m), weight 164 lb 9.6 oz (74.662 kg), SpO2 99 %.    HEENT: No thrush or ulcers Resp: Lungs clear bilaterally Cardio: Regular rate and rhythm GI: No hepatomegaly, no apparent ascites, mildly distended, right lower quadrant drain with minimal fluid in the tubing. Vascular: The right lower leg is slightly larger than the left side   Lab Results:  Lab Results  Component Value Date   WBC 3.9* 02/12/2016   HGB 9.8* 02/12/2016   HCT 29.6* 02/12/2016   MCV 105.7* 02/12/2016   PLT 157 02/12/2016   NEUTROABS 2.4 02/12/2016    Imaging:  Dg Sinus/fist Tube Chk-non Gi  02/10/2016  CLINICAL DATA:  Appendicitis with abscess. Fistula persists after percutaneous abscess drain catheter placement. The drain catheter has been down sized . EXAM: ABSCESS DRAIN CATHETER INJECTION UNDER FLUOROSCOPY TECHNIQUE: The procedure, risks (including but not limited to bleeding, infection, organ damage ), benefits, and alternatives were explained to the patient. Questions regarding the procedure were encouraged and answered. The patient understands and consents to the procedure. Survey fluoroscopic inspection reveals stable position of the right lower quadrant dawson mueller drain catheter. Injection demonstrates no residual abscess cavity. There is prompt passage of contrast through the residual flipped  fistula to the cecum. Some contrast leaks back along the drain catheter with continued injection. IMPRESSION: 1. 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 No residual abscess drain cavity. Findings telephoned to Dr. [name]; Will arrange catheter downsizing/ repositioning more distal to the fistula. Continue gravity drainage as needed. Electronically Signed   By: Lucrezia Europe M.D.   On: 02/10/2016 14:12    Medications: I have reviewed the patient's current medications.  Assessment/Plan: 1. 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 History of anemia, status post a nondiagnostic bone marrow biopsy 10/21/2014  3. NSTEMI March 2014  4. Admission 05/06/2015 with acute onset right abdomen pain-potentially related to acute appendicitis versus pain from carcinomatosis  CT 05/13/2015 consistent with a right lower abdomen abscess, status post catheter drainage by interventional radiology 05/13/2015  Follow-up CT 05/19/2015 showed resolution of the abscess.  Follow-up evaluation in interventional radiology 06/01/2015 showed the abscess cavity had resolved. The abscess drainage catheter communicated with the cecum via the appendix. The catheter was left in place.  CT abdomen/pelvis 06/08/2015 showed the right pelvic drain in place without recurrent or residual surrounding fluid collection. Peritoneal metastasis with slight increase in small to moderate volume of abdominal pelvic ascites.  Drainage catheter injection 06/12/2015 showed residual tiny fistulous connection with the end  of the decompressed abscess cavity within the right lower abdominal quadrant  and the residual lumen of the appendix.  Paracentesis 06/12/2015 with 5 liters of fluid removed  Paracentesis 06/19/2015-negative cytology and culture  Removal of abscess drainage catheter 06/23/2015  CT 07/26/2015 with increased right abdominal wall inflammation and increased fluid collection at site of previous right abdomen abscess, ascites, and enlargement/inflammation of the appendix  Placement of right lower quadrant percutaneous drainage catheter 07/27/2015. Contrast injection confirmed persistent fistulous connection with the ill-defined fluid collection and the residual appendix and cecum. Paracentesis with 2 L of fluid removed.  Follow-up CT abdomen/pelvis 08/05/2015 with small to moderate left pleural effusion and moderate to large volume ascites with both appearing slightly decreased since the prior study. Right lower quadrant drainage catheter in place. No residual fluid collection around the drain or in the adjacent abdomen or pelvis. 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 Drainage catheter left in place.  Tube evaluation 02/10/2016, persistent fistula   5. C. difficile colitis 05/10/2015  6. Right leg edema 12/18/2015. Negative venous Doppler.    Disposition:  [name] [name] appears stable. There is no clinical evidence for progression of the gastrointestinal stromal tumor. He continues follow-up with interventional radiology and Dr. [name] for management of the right lower quadrant fistula.  He will return for an office and lab visit in 6 weeks.  Betsy Coder, MD  02/12/2016  10:57 AM

## 2016-02-12 NOTE — Telephone Encounter (Signed)
Pt confirmed labs/ov per 02/24 POF,gave pt AVS and Calendar... KJ

## 2016-02-16 ENCOUNTER — Other Ambulatory Visit (HOSPITAL_COMMUNITY): Payer: Self-pay | Admitting: Interventional Radiology

## 2016-02-16 ENCOUNTER — Other Ambulatory Visit: Payer: Self-pay | Admitting: General Surgery

## 2016-02-16 ENCOUNTER — Ambulatory Visit (HOSPITAL_COMMUNITY)
Admission: RE | Admit: 2016-02-16 | Discharge: 2016-02-16 | Disposition: A | Discharge status: Home / Self Care | Payer: Medicare Other | Source: Ambulatory Visit | Attending: Interventional Radiology | Admitting: Interventional Radiology

## 2016-02-16 DIAGNOSIS — Z4803 Encounter for change or removal of drains: Secondary | ICD-10-CM | POA: Diagnosis not present

## 2016-02-16 DIAGNOSIS — K3533 Acute appendicitis with perforation and localized peritonitis, with abscess: Secondary | ICD-10-CM

## 2016-02-16 DIAGNOSIS — T8183XD Persistent postprocedural fistula, subsequent encounter: Secondary | ICD-10-CM

## 2016-02-16 DIAGNOSIS — K353 Acute appendicitis with localized peritonitis: Secondary | ICD-10-CM | POA: Diagnosis not present

## 2016-02-16 DIAGNOSIS — C786 Secondary malignant neoplasm of retroperitoneum and peritoneum: Secondary | ICD-10-CM | POA: Diagnosis not present

## 2016-02-16 MED ORDER — IOHEXOL 300 MG/ML  SOLN
10.0000 mL | Freq: Once | INTRAMUSCULAR | Status: AC | PRN
  Administered 2016-02-16: 10 mL

## 2016-02-16 MED ORDER — LIDOCAINE-EPINEPHRINE 2 %-1:100000 IJ SOLN
INTRAMUSCULAR | Status: AC
  Filled 2016-02-16: qty 1

## 2016-02-16 NOTE — Procedures (Signed)
Contrast injection of existing R LQ drain demonstrates persistent fistulous communication with the appendiceal stump. Technically successful fluoro guided down-sizing of a now 8 Fr drainage catheter placement into the right lower abdominal quadrant.   EBL: None No immediate post procedural complications.   Ronny Bacon, MD Pager #: [phone]

## 2016-03-01 ENCOUNTER — Ambulatory Visit
Admission: RE | Admit: 2016-03-01 | Discharge: 2016-03-01 | Disposition: A | Discharge status: Home / Self Care | Payer: Medicare Other | Source: Ambulatory Visit | Attending: General Surgery | Admitting: General Surgery

## 2016-03-01 ENCOUNTER — Ambulatory Visit
Admission: RE | Admit: 2016-03-01 | Discharge: 2016-03-01 | Disposition: A | Discharge status: Home / Self Care | Payer: Medicare Other | Source: Ambulatory Visit | Attending: Interventional Radiology | Admitting: Interventional Radiology

## 2016-03-01 ENCOUNTER — Other Ambulatory Visit: Payer: Self-pay | Admitting: General Surgery

## 2016-03-01 DIAGNOSIS — K3533 Acute appendicitis with perforation and localized peritonitis, with abscess: Secondary | ICD-10-CM

## 2016-03-01 DIAGNOSIS — K632 Fistula of intestine: Secondary | ICD-10-CM | POA: Diagnosis not present

## 2016-03-01 NOTE — Progress Notes (Signed)
Interventional Radiology Progress Note  History: 82 year old male with a right lower abdominal abscess drain.  He had a perforated appendicitis in May 2016, which was drained.   He has had a persistent fistula, for which he has had several drain changes, down-sizes, and now being managed with no daily drain flushes and observation.  Last injection was 2 weeks prior on 02/16/2016.  He has not had fever.   He reports "very little" output into the drain.    Procedure: Injection of the tube under fluoro. .  Findings: No abscess cavity.  Persistent fistula.   Recommendations:  -  Continue current care.  -  No daily routine drain flushes.  - Would schedule follow up drain injection for observed closing of fistula.  Patient requests 4 week interval.  I think this is reasonable.      Signed,  Dulcy Fanny. Earleen Newport, DO

## 2016-03-07 ENCOUNTER — Telehealth: Payer: Self-pay | Admitting: *Deleted

## 2016-03-07 NOTE — Telephone Encounter (Signed)
"  I need a refill on Gleevec sent to [facility].  I have six pills left."  Will notify providers.

## 2016-03-08 MED ORDER — GLEEVEC 400 MG PO TABS: 400.0000 mg | ORAL_TABLET | Freq: Every day | ORAL | Status: DC

## 2016-03-08 MED FILL — GLEEVEC 400 MG TABLET: 400 | 30 days supply | Qty: 30 | Fill #0

## 2016-03-25 ENCOUNTER — Telehealth: Payer: Self-pay | Admitting: Oncology

## 2016-03-25 ENCOUNTER — Other Ambulatory Visit (HOSPITAL_BASED_OUTPATIENT_CLINIC_OR_DEPARTMENT_OTHER): Payer: Medicare Other

## 2016-03-25 ENCOUNTER — Ambulatory Visit (HOSPITAL_BASED_OUTPATIENT_CLINIC_OR_DEPARTMENT_OTHER): Payer: Medicare Other | Admitting: Nurse Practitioner

## 2016-03-25 VITALS — BP 146/58 | HR 69 | Temp 98.4°F | Resp 18 | Ht 67.0 in | Wt 163.2 lb

## 2016-03-25 DIAGNOSIS — C49A3 Gastrointestinal stromal tumor of small intestine: Secondary | ICD-10-CM

## 2016-03-25 LAB — COMPREHENSIVE METABOLIC PANEL
ALBUMIN: 3.4 g/dL — AB (ref 3.5–5.0)
ALK PHOS: 81 U/L (ref 40–150)
ALT: 19 U/L (ref 0–55)
ANION GAP: 6 meq/L (ref 3–11)
AST: 22 U/L (ref 5–34)
BILIRUBIN TOTAL: 0.62 mg/dL (ref 0.20–1.20)
BUN: 17.1 mg/dL (ref 7.0–26.0)
CO2: 27 mEq/L (ref 22–29)
Calcium: 8.5 mg/dL (ref 8.4–10.4)
Chloride: 111 mEq/L — ABNORMAL HIGH (ref 98–109)
Creatinine: 1.2 mg/dL (ref 0.7–1.3)
EGFR: 53 mL/min/{1.73_m2} — AB (ref >90)
Glucose: 98 mg/dl (ref 70–140)
Potassium: 3.9 mEq/L (ref 3.5–5.1)
Sodium: 144 mEq/L (ref 136–145)
TOTAL PROTEIN: 6.6 g/dL (ref 6.4–8.3)

## 2016-03-25 LAB — CBC WITH DIFFERENTIAL/PLATELET
BASO%: 0.6 % (ref 0.0–2.0)
Basophils Absolute: 0 10*3/uL (ref 0.0–0.1)
EOS ABS: 0.1 10*3/uL (ref 0.0–0.5)
EOS%: 2.9 % (ref 0.0–7.0)
HCT: 29.5 % — ABNORMAL LOW (ref 38.4–49.9)
HEMOGLOBIN: 9.8 g/dL — AB (ref 13.0–17.1)
LYMPH%: 18.2 % (ref 14.0–49.0)
MCH: 35 pg — AB (ref 27.2–33.4)
MCHC: 33.2 g/dL (ref 32.0–36.0)
MCV: 105.3 fL — AB (ref 79.3–98.0)
MONO#: 0.5 10*3/uL (ref 0.1–0.9)
MONO%: 11.9 % (ref 0.0–14.0)
NEUT%: 66.4 % (ref 39.0–75.0)
NEUTROS ABS: 2.6 10*3/uL (ref 1.5–6.5)
PLATELETS: 165 10*3/uL (ref 140–400)
RBC: 2.8 10*6/uL — ABNORMAL LOW (ref 4.20–5.82)
RDW: 13.7 % (ref 11.0–14.6)
WBC: 3.9 10*3/uL — AB (ref 4.0–10.3)
lymph#: 0.7 10*3/uL — ABNORMAL LOW (ref 0.9–3.3)

## 2016-03-25 NOTE — Telephone Encounter (Signed)
Gave and printed appt sched and avs fo rpt for May °

## 2016-03-25 NOTE — Progress Notes (Signed)
[place] OFFICE PROGRESS NOTE   Diagnosis:  Gastrointestinal stromal tumor  INTERVAL HISTORY:   [name] [name] returns as scheduled. He continues [place]. He denies diarrhea. No rash. No abdominal pain. He is having very little output from the right lower quadrant catheter.  He reports a one-week history of redness involving the left eye. No associated pain. No visual disturbance. He notes the eye is itchy and watery. He is using over-the-counter eyedrops.  Objective:  Vital signs in last 24 hours:  Blood pressure 146/58, pulse 69, temperature 98.4 F (36.9 C), temperature source Oral, resp. rate 18, height _0  (1.702 m), weight 163 lb 3.2 oz (74.027 kg), SpO2 99 %.    HEENT: Markedly erythematous conjunctiva left eye. Resp: Lungs clear bilaterally. Cardio: Regular rate and rhythm. GI: Abdomen is soft and nontender. No hepatomegaly. No apparent ascites. Right lower quadrant drain. Vascular: Trace lower leg edema bilaterally.   Lab Results:  Lab Results  Component Value Date   WBC 3.9* 03/25/2016   HGB 9.8* 03/25/2016   HCT 29.5* 03/25/2016   MCV 105.3* 03/25/2016   PLT 165 03/25/2016   NEUTROABS 2.6 03/25/2016    Imaging:  No results found.  Medications: I have reviewed the patient's current medications.  Assessment/Plan: 1. 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 History of anemia, status post a nondiagnostic bone marrow biopsy 10/21/2014  3. NSTEMI March 2014  4. Admission 05/06/2015 with acute onset right  abdomen pain-potentially related to acute appendicitis versus pain from carcinomatosis  CT 05/13/2015 consistent with a right lower abdomen abscess, status post catheter drainage by interventional radiology 05/13/2015  Follow-up CT 05/19/2015 showed resolution of the abscess.  Follow-up evaluation in interventional radiology 06/01/2015 showed the abscess cavity had resolved. The abscess drainage catheter communicated with the cecum via the appendix. The catheter was left in place.  CT abdomen/pelvis 06/08/2015 showed the right pelvic drain in place without recurrent or residual surrounding fluid collection. Peritoneal metastasis with slight increase in small to moderate volume of abdominal pelvic ascites.  Drainage catheter injection 06/12/2015 showed residual tiny fistulous connection with the end of the decompressed abscess cavity within the right lower abdominal quadrant and the residual lumen of the appendix.  Paracentesis 06/12/2015 with 5 liters of fluid removed  Paracentesis 06/19/2015-negative cytology and culture  Removal of abscess drainage catheter 06/23/2015  CT 07/26/2015 with increased right abdominal wall inflammation and increased fluid collection at site of previous right abdomen abscess, ascites, and enlargement/inflammation of the appendix  Placement of right lower quadrant percutaneous drainage catheter 07/27/2015. Contrast injection confirmed persistent fistulous connection with the ill-defined fluid collection and the residual appendix and cecum. Paracentesis with 2 L of fluid removed.  Follow-up CT abdomen/pelvis 08/05/2015 with small to moderate left pleural effusion and moderate to large volume ascites with both appearing slightly decreased since the prior study. Right lower quadrant drainage catheter in place. No residual fluid collection around the drain or in the adjacent abdomen or pelvis. Catheter injection shows a fistula to the colon probably related to  appendiceal perforation. Drainage catheter left in place.  Tube evaluation 02/10/2016, persistent fistula  Tube evaluation 03/01/2016, continued fistula to  the cecum   5. C. difficile colitis 05/10/2015  6. Right leg edema 12/18/2015. Negative venous Doppler.   Disposition: [name] [name] appears stable. There is no clinical evidence for progression of the gastrointestinal stromal tumor. He will continue Gleevec.  He continues follow-up with interventional radiology regarding the right lower quadrant drain/fistula.  He has marked left eye conjunctival erythema. We recommend he follow up with his PCP or ophthalmologist.  He will return for a follow-up visit here in 6 weeks. He will contact the office in the interim with any problems.  Patient seen with Dr. [name].    Ned Card ANP/GNP-BC   03/25/2016  10:36 AM

## 2016-03-26 DIAGNOSIS — H1032 Unspecified acute conjunctivitis, left eye: Secondary | ICD-10-CM | POA: Diagnosis not present

## 2016-03-30 ENCOUNTER — Ambulatory Visit
Admission: RE | Admit: 2016-03-30 | Discharge: 2016-03-30 | Disposition: A | Discharge status: Home / Self Care | Payer: Medicare Other | Source: Ambulatory Visit | Attending: General Surgery | Admitting: General Surgery

## 2016-03-30 DIAGNOSIS — K632 Fistula of intestine: Secondary | ICD-10-CM | POA: Diagnosis not present

## 2016-03-30 DIAGNOSIS — K3533 Acute appendicitis with perforation and localized peritonitis, with abscess: Secondary | ICD-10-CM

## 2016-03-30 DIAGNOSIS — L0291 Cutaneous abscess, unspecified: Secondary | ICD-10-CM | POA: Diagnosis not present

## 2016-03-30 NOTE — Progress Notes (Signed)
Interventional Radiology Progress Note  [name] [name] presents for drain injection.   He reports no significant output, perhaps a "teaspoon per day." He denies fevers rigors chills.  No recent hospitalizations.  Injection of the drain reveals no persisting abscess.  The fistula persists.  The drain is directly adjacent the colon.    PLAN: I think it is reasonable to do an injection and drain exchange for same size drain with attempt to locate away from the colon.  Hopefully this will allow the colon to heal the fistula.   I would suggest no further flushing at this time.  I have discussed our plan with [name] Isip and he agrees.   Signed,  Dulcy Fanny. Earleen Newport, DO

## 2016-04-01 ENCOUNTER — Other Ambulatory Visit (HOSPITAL_COMMUNITY): Payer: Self-pay | Admitting: Interventional Radiology

## 2016-04-01 DIAGNOSIS — L0291 Cutaneous abscess, unspecified: Secondary | ICD-10-CM

## 2016-04-05 ENCOUNTER — Other Ambulatory Visit (HOSPITAL_COMMUNITY): Payer: Self-pay | Admitting: Interventional Radiology

## 2016-04-05 ENCOUNTER — Ambulatory Visit (HOSPITAL_COMMUNITY)
Admission: RE | Admit: 2016-04-05 | Discharge: 2016-04-05 | Disposition: A | Discharge status: Home / Self Care | Payer: Medicare Other | Source: Ambulatory Visit | Attending: Interventional Radiology | Admitting: Interventional Radiology

## 2016-04-05 DIAGNOSIS — K632 Fistula of intestine: Secondary | ICD-10-CM | POA: Insufficient documentation

## 2016-04-05 DIAGNOSIS — L0291 Cutaneous abscess, unspecified: Secondary | ICD-10-CM

## 2016-04-05 MED ORDER — LIDOCAINE HCL 1 % IJ SOLN
INTRAMUSCULAR | Status: AC
  Filled 2016-04-05: qty 20

## 2016-04-05 MED ORDER — IOPAMIDOL (ISOVUE-300) INJECTION 61%
INTRAVENOUS | Status: AC
  Filled 2016-04-05: qty 50

## 2016-04-05 NOTE — Procedures (Signed)
Successful fluoro drain exchg ot a smaller DM cath Fistula still present to cecum No comp Stable Keep to gravity bag Clinic injection in 2 weeks

## 2016-04-07 ENCOUNTER — Other Ambulatory Visit (HOSPITAL_COMMUNITY): Payer: Self-pay | Admitting: Interventional Radiology

## 2016-04-07 DIAGNOSIS — L0291 Cutaneous abscess, unspecified: Secondary | ICD-10-CM

## 2016-04-11 ENCOUNTER — Ambulatory Visit (HOSPITAL_COMMUNITY)
Admission: RE | Admit: 2016-04-11 | Discharge: 2016-04-11 | Disposition: A | Discharge status: Home / Self Care | Payer: Medicare Other | Source: Ambulatory Visit | Attending: Diagnostic Radiology | Admitting: Diagnostic Radiology

## 2016-04-11 ENCOUNTER — Other Ambulatory Visit (HOSPITAL_COMMUNITY): Payer: Self-pay | Admitting: Emergency Medicine

## 2016-04-11 ENCOUNTER — Other Ambulatory Visit: Payer: Self-pay | Admitting: Oncology

## 2016-04-11 DIAGNOSIS — L0291 Cutaneous abscess, unspecified: Secondary | ICD-10-CM

## 2016-04-11 MED FILL — GLEEVEC 400 MG TABLET: 400 | 30 days supply | Qty: 30 | Fill #0

## 2016-04-11 NOTE — Progress Notes (Signed)
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  Medications: Prior to Admission medications   Medication Sig Start Date End Date Taking? Authorizing Provider  amLODipine (NORVASC) 5 MG tablet Take 5 mg by mouth daily. 03/17/15   Historical Provider, MD  furosemide (LASIX) 20 MG tablet Take 1 tablet (20 mg total) by mouth daily. 05/20/15   Hosie Poisson, MD  GLEEVEC 400 MG tablet Take 1 tablet (400 mg total) by mouth daily. Take with meals and large glass of water.Caution:Chemotherapy. 03/08/16   Ladell Pier, MD  HYDROcodone-acetaminophen (NORCO/VICODIN) 5-325 MG per tablet Take 1 tablet by mouth every 4 (four) hours as needed for moderate pain. Patient not taking: Reported on 02/12/2016 07/29/15   Debbe Odea, MD  metoprolol tartrate (LOPRESSOR) 25 MG tablet Take 1 tablet (25 mg total) by mouth 2 (two) times daily. 03/06/13   Costin Karlyne Greenspan, MD  saccharomyces boulardii (FLORASTOR) 250 MG capsule Take 1 capsule (250 mg total) by mouth 2 (two) times daily. 07/29/15   Debbe Odea, MD  simvastatin (ZOCOR) 20 MG tablet Take 20 mg by mouth every evening.    Historical Provider, MD  VENTOLIN HFA 108 [phone] Base) MCG/ACT inhaler  12/29/15   Historical Provider, MD     Vital Signs: There were no vitals taken for this visit.  Physical Exam  Abdominal: Soft. Bowel sounds are normal.  Skin: Skin is warm and dry.  There is erythema.  Skin site of RLQ abscess drain is sl reddened 46mm are of where skin has come off near site Tender at this area No pus noted No sign of infection NT to touch No swelling    Imaging: No results found.  Labs:  CBC:  Recent Labs  12/18/15 1148 01/15/16 0933 02/12/16 0951 03/25/16 0952  WBC 4.1 5.3 3.9* 3.9*  HGB 9.8* 9.2* 9.8* 9.8*  HCT 29.4* 27.8* 29.6* 29.5*  PLT 199 191 157 165    COAGS:  Recent Labs  05/06/15 0304 07/26/15 1731  INR 1.14 1.17  APTT  --  27    BMP:  Recent Labs  07/26/15 1222 07/26/15 1731 07/27/15 0514 07/28/15 0555  12/18/15 1148 01/15/16 0933 02/12/16 0951 03/25/16 0952  NA 140 137 138 139  < > 144 143 144 144  K 3.6 3.7 4.0 4.2  < > 4.1 3.7 4.1 3.9  CL 111 113* 112* 112*  --   --   --   --   --   CO2 24 22 23 23   < > 27 27 25 27   GLUCOSE 124* 96 96 108*  < > 100 95 91 98  BUN 18 15 17 19   < > 16.3 19.6 19.2 17.1  CALCIUM 7.9* 7.2* 7.7* 7.6*  < > 8.4 8.3* 8.5 8.5  CREATININE 1.28* 1.18 1.26* 1.19  < > 1.2 1.3 1.2 1.2  GFRNONAA 50* 55* 51* 54*  --   --   --   --   --  GFRAA 58* >60 59* >60  --   --   --   --   --   < > = values in this interval not displayed.  LIVER FUNCTION TESTS:  Recent Labs  12/18/15 1148 01/15/16 0933 02/12/16 0951 03/25/16 0952  BILITOT 0.54 0.45 0.45 0.62  AST 24 24 34 22  ALT 19 24 37 19  ALKPHOS 92 85 104 81  PROT 7.1 6.8 6.5 6.6  ALBUMIN 3.5 3.4* 3.3* 3.4*    Assessment and Plan:  New dressing applied gave pts wife some bacitracin skin ointment Use with every new dressing application Reassurance Return as needed   Electronically Signed: Reco Shonk A 04/11/2016, 11:32 AM   I spent a total of 15 Minutes at the the patient's bedside AND on the patient's hospital floor or unit, greater than 50% of which was counseling/coordinating care for RLQ abscess skin site check

## 2016-04-20 ENCOUNTER — Telehealth: Payer: Self-pay | Admitting: Oncology

## 2016-04-20 NOTE — Telephone Encounter (Signed)
S/w pt, advised appt chg from 5/19 to 5/22 @ 11.30 due to md pal. Pt verbalized understanding.

## 2016-05-02 DIAGNOSIS — Z961 Presence of intraocular lens: Secondary | ICD-10-CM | POA: Diagnosis not present

## 2016-05-04 ENCOUNTER — Other Ambulatory Visit: Payer: Self-pay | Admitting: General Surgery

## 2016-05-04 ENCOUNTER — Ambulatory Visit (HOSPITAL_COMMUNITY)
Admission: RE | Admit: 2016-05-04 | Discharge: 2016-05-04 | Disposition: A | Discharge status: Home / Self Care | Payer: Medicare Other | Source: Ambulatory Visit | Attending: Interventional Radiology | Admitting: Interventional Radiology

## 2016-05-04 ENCOUNTER — Other Ambulatory Visit (HOSPITAL_COMMUNITY): Payer: Self-pay | Admitting: Interventional Radiology

## 2016-05-04 DIAGNOSIS — L0291 Cutaneous abscess, unspecified: Secondary | ICD-10-CM

## 2016-05-04 DIAGNOSIS — Z4803 Encounter for change or removal of drains: Secondary | ICD-10-CM | POA: Diagnosis not present

## 2016-05-04 DIAGNOSIS — K632 Fistula of intestine: Secondary | ICD-10-CM | POA: Diagnosis not present

## 2016-05-04 DIAGNOSIS — K651 Peritoneal abscess: Secondary | ICD-10-CM | POA: Diagnosis present

## 2016-05-04 DIAGNOSIS — K352 Acute appendicitis with generalized peritonitis: Secondary | ICD-10-CM | POA: Diagnosis not present

## 2016-05-04 DIAGNOSIS — K3533 Acute appendicitis with perforation and localized peritonitis, with abscess: Secondary | ICD-10-CM

## 2016-05-04 MED ORDER — LIDOCAINE HCL 1 % IJ SOLN
INTRAMUSCULAR | Status: AC
  Administered 2016-05-04: 5 mL
  Filled 2016-05-04: qty 20

## 2016-05-04 MED ORDER — IOPAMIDOL (ISOVUE-300) INJECTION 61%
INTRAVENOUS | Status: AC
  Administered 2016-05-04: 15 mL
  Filled 2016-05-04: qty 50

## 2016-05-06 ENCOUNTER — Other Ambulatory Visit: Payer: Medicare Other

## 2016-05-06 ENCOUNTER — Ambulatory Visit: Payer: Medicare Other | Admitting: Oncology

## 2016-05-09 ENCOUNTER — Ambulatory Visit (HOSPITAL_BASED_OUTPATIENT_CLINIC_OR_DEPARTMENT_OTHER): Payer: Medicare Other | Admitting: Oncology

## 2016-05-09 ENCOUNTER — Telehealth: Payer: Self-pay | Admitting: Hematology

## 2016-05-09 ENCOUNTER — Other Ambulatory Visit (HOSPITAL_BASED_OUTPATIENT_CLINIC_OR_DEPARTMENT_OTHER): Payer: Medicare Other

## 2016-05-09 VITALS — BP 161/56 | HR 63 | Temp 98.2°F | Resp 18 | Ht 67.0 in | Wt 159.0 lb

## 2016-05-09 DIAGNOSIS — C49A3 Gastrointestinal stromal tumor of small intestine: Secondary | ICD-10-CM | POA: Diagnosis not present

## 2016-05-09 LAB — COMPREHENSIVE METABOLIC PANEL
ALBUMIN: 3.7 g/dL (ref 3.5–5.0)
ALK PHOS: 74 U/L (ref 40–150)
ALT: 14 U/L (ref 0–55)
AST: 18 U/L (ref 5–34)
Anion Gap: 7 mEq/L (ref 3–11)
BILIRUBIN TOTAL: 0.43 mg/dL (ref 0.20–1.20)
BUN: 18.9 mg/dL (ref 7.0–26.0)
CO2: 28 meq/L (ref 22–29)
CREATININE: 1.3 mg/dL (ref 0.7–1.3)
Calcium: 8.8 mg/dL (ref 8.4–10.4)
Chloride: 109 mEq/L (ref 98–109)
EGFR: 49 mL/min/{1.73_m2} — ABNORMAL LOW (ref >90)
GLUCOSE: 108 mg/dL (ref 70–140)
Potassium: 4.3 mEq/L (ref 3.5–5.1)
SODIUM: 143 meq/L (ref 136–145)
TOTAL PROTEIN: 6.9 g/dL (ref 6.4–8.3)

## 2016-05-09 LAB — CBC WITH DIFFERENTIAL/PLATELET
BASO%: 0.6 % (ref 0.0–2.0)
Basophils Absolute: 0 10*3/uL (ref 0.0–0.1)
EOS ABS: 0.4 10*3/uL (ref 0.0–0.5)
EOS%: 7.3 % — ABNORMAL HIGH (ref 0.0–7.0)
HCT: 31.3 % — ABNORMAL LOW (ref 38.4–49.9)
HEMOGLOBIN: 10.6 g/dL — AB (ref 13.0–17.1)
LYMPH%: 17.8 % (ref 14.0–49.0)
MCH: 35.4 pg — ABNORMAL HIGH (ref 27.2–33.4)
MCHC: 34 g/dL (ref 32.0–36.0)
MCV: 104 fL — AB (ref 79.3–98.0)
MONO#: 0.5 10*3/uL (ref 0.1–0.9)
MONO%: 9.6 % (ref 0.0–14.0)
NEUT%: 64.7 % (ref 39.0–75.0)
NEUTROS ABS: 3.6 10*3/uL (ref 1.5–6.5)
PLATELETS: 194 10*3/uL (ref 140–400)
RBC: 3.01 10*6/uL — AB (ref 4.20–5.82)
RDW: 13.8 % (ref 11.0–14.6)
WBC: 5.6 10*3/uL (ref 4.0–10.3)
lymph#: 1 10*3/uL (ref 0.9–3.3)

## 2016-05-09 MED ORDER — GLEEVEC 400 MG PO TABS: 400.0000 mg | ORAL_TABLET | Freq: Every day | ORAL | Status: DC

## 2016-05-09 MED FILL — GLEEVEC 400 MG TABLET: 400 | 30 days supply | Qty: 30 | Fill #0

## 2016-05-09 NOTE — Telephone Encounter (Signed)
per pof to sch pt appt-gave pt copy of avs °

## 2016-05-09 NOTE — Progress Notes (Signed)
[place] OFFICE PROGRESS NOTE   Diagnosis: Gastrointestinal stromal tumor   INTERVAL HISTORY:   [name] returns as scheduled. He continues [place]. The abdominal catheter was downsized on 05/04/2016. No abscess cavity was appreciated. He reports minimal drainage from the catheter. He is scheduled for follow-up in 3 weeks. He has occasional discomfort at the right inguinal hernia. No consistent pain. No other complaint. No diarrhea or rash.  Objective:  Vital signs in last 24 hours:  Blood pressure 161/56, pulse 63, temperature 98.2 F (36.8 C), temperature source Oral, resp. rate 18, height 5' 7"  (1.702 m), weight 159 lb (72.122 kg), SpO2 100 %.    HEENT: No thrush or ulcers Resp: Lungs clear bilaterally Cardio: Regular rate and rhythm GI: No hepatomegaly, mildly distended, right lower quadrant drain site with a gauze dressing, no mass, soft right inguinal hernia Vascular: Trace right greater than left low leg edema  Skin: No rash   Portacath/PICC-without erythema  Lab Results:  Lab Results  Component Value Date   WBC 5.6 05/09/2016   HGB 10.6* 05/09/2016   HCT 31.3* 05/09/2016   MCV 104.0* 05/09/2016   PLT 194 05/09/2016   NEUTROABS 3.6 05/09/2016     Medications: I have reviewed the patient's current medications.  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 History of anemia, status post a nondiagnostic bone marrow biopsy 10/21/2014  3. NSTEMI March 2014  4. Admission 05/06/2015 with acute  onset right abdomen pain-potentially related to acute appendicitis versus pain from carcinomatosis  CT 05/13/2015 consistent with a right lower abdomen abscess, status post catheter drainage by interventional radiology 05/13/2015  Follow-up CT 05/19/2015 showed resolution of the abscess.  Follow-up evaluation in interventional radiology 06/01/2015 showed the abscess cavity had resolved. The abscess drainage catheter communicated with the cecum via the appendix. The catheter was left in place.  CT abdomen/pelvis 06/08/2015 showed the right pelvic drain in place without recurrent or residual surrounding fluid collection. Peritoneal metastasis with slight increase in small to moderate volume of abdominal pelvic ascites.  Drainage catheter injection 06/12/2015 showed residual tiny fistulous connection with the end of the decompressed abscess cavity within the right lower abdominal quadrant and the residual lumen of the appendix.  Paracentesis 06/12/2015 with 5 liters of fluid removed  Paracentesis 06/19/2015-negative cytology and culture  Removal of abscess drainage catheter 06/23/2015  CT 07/26/2015 with increased right abdominal wall inflammation and increased fluid collection at site of previous right abdomen abscess, ascites, and enlargement/inflammation of the appendix  Placement of right lower quadrant percutaneous drainage catheter 07/27/2015. Contrast injection confirmed persistent fistulous connection with the ill-defined fluid collection and the residual appendix and cecum. Paracentesis with 2 L of fluid removed.  Follow-up CT abdomen/pelvis 08/05/2015 with small to moderate left pleural effusion and moderate to large volume ascites with both appearing slightly decreased since the prior study. Right lower quadrant drainage catheter in place. No residual fluid collection around the drain or in the adjacent abdomen or pelvis. Catheter injection shows a fistula to the colon probably related  to appendiceal perforation. Drainage catheter left in place.  Tube evaluation 02/10/2016, persistent fistula  Tube evaluation 03/01/2016, continued fistula  to the cecum  Tube downsized 05/04/2016, no residual abscess seen   5. C. difficile colitis 05/10/2015  6. Right leg edema 12/18/2015. Negative venous Doppler.   Disposition:  [name] continues [place]. He is tolerating the [place] well and there is no evidence for progression of the gastrointestinal stromal tumor. He will return for an office and lab visit in 6 weeks.  He will continue follow-up with interventional radiology and Dr. [name] for management of the right lower quadrant drain. He will schedule an appointment with Dr. [name] if there is consistent discomfort associated with the hernia.  Betsy Coder, MD  05/09/2016  1:15 PM

## 2016-05-10 DIAGNOSIS — H1012 Acute atopic conjunctivitis, left eye: Secondary | ICD-10-CM | POA: Diagnosis not present

## 2016-05-17 ENCOUNTER — Other Ambulatory Visit: Payer: Self-pay | Admitting: General Surgery

## 2016-05-17 ENCOUNTER — Ambulatory Visit
Admission: RE | Admit: 2016-05-17 | Discharge: 2016-05-17 | Disposition: A | Discharge status: Home / Self Care | Payer: Medicare Other | Source: Ambulatory Visit | Attending: General Surgery | Admitting: General Surgery

## 2016-05-17 DIAGNOSIS — K3533 Acute appendicitis with perforation and localized peritonitis, with abscess: Secondary | ICD-10-CM

## 2016-05-17 DIAGNOSIS — K353 Acute appendicitis with localized peritonitis: Secondary | ICD-10-CM | POA: Diagnosis not present

## 2016-05-17 HISTORY — PX: IR GENERIC HISTORICAL: IMG1180011

## 2016-05-17 NOTE — Progress Notes (Signed)
Patient ID: [name] [name], male   DOB: June 25, 1929, 82 y.o.   MRN: [id number]       Chief Complaint: Chronic right lower quadrant abscess drain post appendectomy.  Referring Physician(s): Hoxworth,Benjamin  History of Present Illness: [name] is a 82 y.o. male with a chronic indwelling right lower quadrant abscess drain catheter. Drain catheter has been downsized 8 [place]. He returns for outpatient injection and follow-up for the catheter. Overall he continues to do very well. No interval fevers or abdominal pain. No change in appetite or bowel habits. He reports minimal exudative output from the drain catheter of only a few cc a day.  Past Medical History  Diagnosis Date  . Hyperlipidemia     TAKES zOCOR DAILY  . Abdominal mass 02/2013  . UTI (urinary tract infection)   . Hypertension     TAKES LOTREL,HCTZ,AND METOPROLOL DAILY  . NSTEMI (non-ST elevated myocardial infarction) 02/2013    "LIGHT: ONE MD SAID HE DID AND ONE SAID HE DIDN'T  . Pneumonia     MAR 2014  . Stromal tumor of digestive system   . CAD (coronary artery disease)   . Shortness of breath   . Acute respiratory failure with hypoxia 09/12/2014  . Hemorrhagic shock 09/13/2014  . Intraperitoneal hemorrhage 09/17/2014  . CKD (chronic kidney disease), stage III     Past Surgical History  Procedure Laterality Date  . Cyst excision  1962    wrist  . Cardiac catheterization  03-05-13  . Laparotomy N/A 06/04/2013    Procedure: EXPLORATORY LAPAROTOMY, OPEN RESECTION OF MESENTERIC AND INTESTINAL MASS;  Surgeon: [name], MD;  Location: [facility];  Service: General;  Laterality: N/A;  Small Bowel Resection  . Colonoscopy  09/08/2014    dr scooler  . Left heart catheterization with coronary angiogram N/A 03/05/2013    Procedure: LEFT HEART CATHETERIZATION WITH CORONARY ANGIOGRAM;  Surgeon: [name], MD;  Location: [facility] CATH LAB;  Service: Cardiovascular;  Laterality: N/A;    Allergies: Review of patient's allergies  indicates no known allergies.  Medications: Prior to Admission medications   Medication Sig Start Date End Date Taking? Authorizing Provider  amLODipine (NORVASC) 5 MG tablet Take 5 mg by mouth daily. 03/17/15   Historical Provider, MD  furosemide (LASIX) 20 MG tablet Take 1 tablet (20 mg total) by mouth daily. 05/20/15   Hosie Poisson, MD  GLEEVEC 400 MG tablet Take 1 tablet (400 mg total) by mouth daily. Take with meals and large glass of water.Caution:Chemotherapy. 05/09/16   Ladell Pier, MD  HYDROcodone-acetaminophen (NORCO/VICODIN) 5-325 MG per tablet Take 1 tablet by mouth every 4 (four) hours as needed for moderate pain. Patient not taking: Reported on 02/12/2016 07/29/15   Debbe Odea, MD  metoprolol tartrate (LOPRESSOR) 25 MG tablet Take 1 tablet (25 mg total) by mouth 2 (two) times daily. 03/06/13   Costin Karlyne Greenspan, MD  saccharomyces boulardii (FLORASTOR) 250 MG capsule Take 1 capsule (250 mg total) by mouth 2 (two) times daily. Patient taking differently: Take 250 mg by mouth daily.  07/29/15   Debbe Odea, MD  simvastatin (ZOCOR) 20 MG tablet Take 20 mg by mouth every evening.    Historical Provider, MD     Family History  Problem Relation Age of Onset  . Skin cancer Brother   . Stroke Mother   . Skin cancer Father     Social History   Social History  . Marital Status: Married    Spouse Name: [name]  .  Number of Children: 2  . Years of Education: N/A   Occupational History  . Retired     Writer   Social History Main Topics  . Smoking status: Never Smoker   . Smokeless tobacco: Never Used  . Alcohol Use: No  . Drug Use: No  . Sexual Activity: No   Other Topics Concern  . Not on file   Social History Narrative   Married, wife [name]   Retired Environmental manager for 49 years   Independent in [place]   #2 grown sons     Review of Systems: A 12 point ROS discussed and pertinent positives are indicated in the HPI above.  All other systems are  negative.  Review of Systems  Vital Signs: BP 170/64 mmHg  Pulse 62  Temp(Src) 97.8 F (36.6 C) (Oral)  SpO2 99%  Physical Exam  Constitutional: He appears well-developed and well-nourished. No distress.  Abdominal: Soft. Bowel sounds are normal. He exhibits no distension.  Right lower quadrant abscess drain catheter site is clean, dry and intact. No signs of infection.  Skin: He is not diaphoretic.     Imaging: Ir Catheter Tube Change  05/04/2016  CLINICAL DATA:  Perforated appendicitis complicated by Right lower quadrant abscess, post percutaneous drain catheter placement. Fistula to cecum on previous studies. Previously, the catheter has been downsized in hopes of encouraging closure of the fistula. EXAM: RIGHT LOWER QUADRANT ABSCESS DRAIN CATHETER INJECTION UNDER FLUOROSCOPY TECHNIQUE: The procedure, risks (including but not limited to bleeding, infection, organ damage ), benefits, and alternatives were explained to the patient. Questions regarding the procedure were encouraged and answered. The patient understands and consents to the procedure. Survey fluoroscopic inspection reveals stable position of the right lower quadrant 10 French Dawson Mueller drain catheter. Injection demonstrates persistence of the fistula to the cecum. There is a short tract from the skin to the pigtail portion of the catheter. No significant abscess cavity is evident. The catheter and surrounding skin were prepped and draped in usual sterile fashion. Catheter was cut and exchanged over a Bentson wire. Initially I tried to place a new Bettey Mare catheter more peripherally but there is really no room to pull back the catheter any more as it is nearly up against the abdominal wall fascia. Accordingly, I decided to further downsize the catheter from 10 to 8 [place]. Over the guidewire, an 8 [place] pigtail drain was advanced, positioned in the residual cavity and tract. IMPRESSION: 1. Persistent fistula to the  cecum with no residual abscess cavity. 2. Drain catheter down sized from 10 to a 8 [place] and maintained to gravity drainage with no change in flushing regimen. We will see him back in drain clinic in 3 weeks. Electronically Signed   By: Lucrezia Europe M.D.   On: 05/04/2016 13:03   Dg Sinus/fist Tube Chk-non Gi  05/17/2016  INDICATION: Perforated appendicitis complicated by right lower quadrant abscess, status post percutaneous drain. Chronic fistula to the cecum. Previously, the catheter has been down sized to an 8 [place]. Outpatient follow-up. EXAM: Fluoroscopic injection of the right lower quadrant abscess drain. Date:  5/30/20175/30/2017 11:22 am Radiologist:  M. Daryll Brod, MD Guidance:  Fluoroscopic FLUOROSCOPY TIME:  42 seconds (42 mGy). MEDICATIONS: None. ANESTHESIA/SEDATION: None. CONTRAST:  4 cc Omnipaque [id number] COMPLICATIONS: None immediate. PROCEDURE: Under sterile conditions, the existing right lower quadrant abscess drain was injected with contrast under fluoroscopy. Injection confirms stable position of the small 8 [place] catheter. Abscess cavity  remains collapsed. Fistula remains patent to the cecum however appears smaller than 05/04/2016. IMPRESSION: Persistent fistula to the cecum from the collapsed residual abscess cavity although smaller. Stable drain catheter position. Repeat injection in 4 weeks. Electronically Signed   By: Jerilynn Mages.  Gable Odonohue M.D.   On: 05/17/2016 12:01    Labs:  CBC:  Recent Labs  01/15/16 0933 02/12/16 0951 03/25/16 0952 05/09/16 1139  WBC 5.3 3.9* 3.9* 5.6  HGB 9.2* 9.8* 9.8* 10.6*  HCT 27.8* 29.6* 29.5* 31.3*  PLT 191 157 165 194    COAGS:  Recent Labs  07/26/15 1731  INR 1.17  APTT 27    BMP:  Recent Labs  07/26/15 1222 07/26/15 1731 07/27/15 0514 07/28/15 0555  01/15/16 0933 02/12/16 0951 03/25/16 0952 05/09/16 1139  NA 140 137 138 139  < > 143 144 144 143  K 3.6 3.7 4.0 4.2  < > 3.7 4.1 3.9 4.3  CL 111 113* 112* 112*  --   --   --   --   --    CO2 24 22 23 23   < > 27 25 27 28   GLUCOSE 124* 96 96 108*  < > 95 91 98 108  BUN 18 15 17 19   < > 19.6 19.2 17.1 18.9  CALCIUM 7.9* 7.2* 7.7* 7.6*  < > 8.3* 8.5 8.5 8.8  CREATININE 1.28* 1.18 1.26* 1.19  < > 1.3 1.2 1.2 1.3  GFRNONAA 50* 55* 51* 54*  --   --   --   --   --   GFRAA 58* >60 59* >60  --   --   --   --   --   < > = values in this interval not displayed.  LIVER FUNCTION TESTS:  Recent Labs  01/15/16 0933 02/12/16 0951 03/25/16 0952 05/09/16 1139  BILITOT 0.45 0.45 0.62 0.43  AST 24 34 22 18  ALT 24 37 19 14  ALKPHOS 85 104 81 74  PROT 6.8 6.5 6.6 6.9  ALBUMIN 3.4* 3.3* 3.4* 3.7    TUMOR MARKERS: No results for input(s): AFPTM, CEA, CA199, CHROMGRNA in the last 8760 hours.  Assessment and Plan:  Repeat injection of the smaller 8 French right lower quadrant abscess drain demonstrates a tiny residual fistula from the collapsed abscess cavity to the cecum. Fistula appears smaller than 1 month ago. Patient remains stable and asymptomatic.  Plan: Continue gravity drainage to the abscess catheter. Patient is no longer flushing the catheter. Repeat injection in 4 weeks.  Electronically Signed: [name] 05/17/2016, 1:03 PM   I spent a total of    15 Minutes in face to face in clinical consultation, greater than 50% of which was counseling/coordinating care for This patient with a chronic right lower quadrant abscess drain and chronic fistula to the cecum.

## 2016-06-13 MED FILL — GLEEVEC 400 MG TABLET: 400 | 30 days supply | Qty: 30 | Fill #1

## 2016-06-15 ENCOUNTER — Other Ambulatory Visit: Payer: Medicare Other

## 2016-06-16 ENCOUNTER — Ambulatory Visit
Admission: RE | Admit: 2016-06-16 | Discharge: 2016-06-16 | Disposition: A | Discharge status: Home / Self Care | Payer: Medicare Other | Source: Ambulatory Visit | Attending: General Surgery | Admitting: General Surgery

## 2016-06-16 DIAGNOSIS — K632 Fistula of intestine: Secondary | ICD-10-CM | POA: Diagnosis not present

## 2016-06-16 DIAGNOSIS — K3533 Acute appendicitis with perforation and localized peritonitis, with abscess: Secondary | ICD-10-CM

## 2016-06-16 NOTE — Progress Notes (Signed)
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 Hyperlipidemia     TAKES zOCOR DAILY  . Abdominal mass 02/2013  . UTI (urinary tract infection)   . Hypertension     TAKES LOTREL,HCTZ,AND METOPROLOL DAILY  . NSTEMI (non-ST elevated myocardial infarction) 02/2013    "LIGHT: ONE MD SAID HE DID AND ONE SAID HE DIDN'T  . Pneumonia     MAR 2014  . Stromal tumor of digestive system   . CAD (coronary artery disease)   . Shortness of breath   . Acute respiratory failure with hypoxia 09/12/2014  . Hemorrhagic shock 09/13/2014  . Intraperitoneal hemorrhage 09/17/2014  . CKD (chronic kidney disease), stage III     Past Surgical History  Procedure Laterality Date  . Cyst excision  1962    wrist  . Cardiac catheterization  03-05-13  . Laparotomy N/A 06/04/2013    Procedure: EXPLORATORY LAPAROTOMY, OPEN RESECTION OF MESENTERIC AND INTESTINAL MASS;  Surgeon: [name], MD;  Location: [facility];  Service: General;  Laterality: N/A;  Small Bowel Resection  . Colonoscopy  09/08/2014    dr scooler  . Left heart catheterization with coronary angiogram N/A 03/05/2013    Procedure: LEFT HEART CATHETERIZATION WITH CORONARY ANGIOGRAM;  Surgeon: [name], MD;  Location: [facility] CATH LAB;  Service: Cardiovascular;  Laterality: N/A;    Allergies: Review of patient's allergies indicates no known allergies.  Medications: Prior to Admission medications   Medication Sig Start Date End Date Taking? Authorizing Provider    amLODipine (NORVASC) 5 MG tablet Take 5 mg by mouth daily. 03/17/15   Historical Provider, MD  furosemide (LASIX) 20 MG tablet Take 1 tablet (20 mg total) by mouth daily. 05/20/15   Hosie Poisson, MD  GLEEVEC 400 MG tablet Take 1 tablet (400 mg total) by mouth daily. Take with meals and large glass of water.Caution:Chemotherapy. 05/09/16   Ladell Pier, MD  HYDROcodone-acetaminophen (NORCO/VICODIN) 5-325 MG per tablet Take 1 tablet by mouth every 4 (four) hours as needed for moderate pain. Patient not taking: Reported on 02/12/2016 07/29/15   Debbe Odea, MD  metoprolol tartrate (LOPRESSOR) 25 MG tablet Take 1 tablet (25 mg total) by mouth 2 (two) times daily. 03/06/13   Costin Karlyne Greenspan, MD  saccharomyces boulardii (FLORASTOR) 250 MG capsule Take 1 capsule (250 mg total) by mouth 2 (two) times daily. Patient taking differently: Take 250 mg by mouth daily.  07/29/15   Debbe Odea, MD  simvastatin (ZOCOR) 20 MG tablet Take 20 mg by mouth every evening.    Historical Provider, MD     Family History  Problem Relation Age of Onset  . Skin cancer Brother   . Stroke Mother   . Skin cancer Father     Social History   Social History  . Marital Status: Married    Spouse Name: [name]  . Number of Children: 2  . Years of Education: N/A   Occupational History  . Retired     Writer  Social History Main Topics  . Smoking status: Never Smoker   . Smokeless tobacco: Never Used  . Alcohol Use: No  . Drug Use: No  . Sexual Activity: No   Other Topics Concern  . Not on file   Social History Narrative   Married, wife [name]   Retired Environmental manager for 34 years   Independent in [place]   #2 grown sons     Vital Signs: BP: 179/71; 02 sat 98% RA; P: 87; T: 97.5  Physical Exam  Constitutional: He is oriented to person, place, and time.  Neurological: He is alert and oriented to person, place, and time.  Skin: Skin is warm and dry.  Site of drain is NT no bleeding   no sign of infection Output in bag is yellowish- scant +odorous Afeb    Drain injection does show continued fistula per Dr Annamaria Boots  Imaging: No results found.  Labs:  CBC:  Recent Labs  01/15/16 0933 02/12/16 0951 03/25/16 0952 05/09/16 1139  WBC 5.3 3.9* 3.9* 5.6  HGB 9.2* 9.8* 9.8* 10.6*  HCT 27.8* 29.6* 29.5* 31.3*  PLT 191 157 165 194    COAGS:  Recent Labs  07/26/15 1731  INR 1.17  APTT 27    BMP:  Recent Labs  07/26/15 1222 07/26/15 1731 07/27/15 0514 07/28/15 0555  01/15/16 0933 02/12/16 0951 03/25/16 0952 05/09/16 1139  NA 140 137 138 139  < > 143 144 144 143  K 3.6 3.7 4.0 4.2  < > 3.7 4.1 3.9 4.3  CL 111 113* 112* 112*  --   --   --   --   --   CO2 24 22 23 23   < > 27 25 27 28   GLUCOSE 124* 96 96 108*  < > 95 91 98 108  BUN 18 15 17 19   < > 19.6 19.2 17.1 18.9  CALCIUM 7.9* 7.2* 7.7* 7.6*  < > 8.3* 8.5 8.5 8.8  CREATININE 1.28* 1.18 1.26* 1.19  < > 1.3 1.2 1.2 1.3  GFRNONAA 50* 55* 51* 54*  --   --   --   --   --   GFRAA 58* >60 59* >60  --   --   --   --   --   < > = values in this interval not displayed.  LIVER FUNCTION TESTS:  Recent Labs  01/15/16 0933 02/12/16 0951 03/25/16 0952 05/09/16 1139  BILITOT 0.45 0.45 0.62 0.43  AST 24 34 22 18  ALT 24 37 19 14  ALKPHOS 85 104 81 74  PROT 6.8 6.5 6.6 6.9  ALBUMIN 3.4* 3.3* 3.4* 3.7    Assessment:  Rt abdominal abscess drain placed 05/13/2015 Continued fistula for over 1 yr + fistula on injection today Dr Annamaria Boots spoke to Dr [name] Plan: continue drain for now Pt to see Dr [name] in 2 weeks in office for discussion of new plan. Pt agreeable  Signed: Rice Walsh A 06/16/2016, 9:27 AM   Please refer to Dr. Annamaria Boots attestation of this note for management and plan.

## 2016-06-23 ENCOUNTER — Telehealth: Payer: Self-pay | Admitting: Oncology

## 2016-06-23 ENCOUNTER — Ambulatory Visit (HOSPITAL_BASED_OUTPATIENT_CLINIC_OR_DEPARTMENT_OTHER): Payer: Medicare Other | Admitting: Oncology

## 2016-06-23 ENCOUNTER — Other Ambulatory Visit (HOSPITAL_BASED_OUTPATIENT_CLINIC_OR_DEPARTMENT_OTHER): Payer: Medicare Other

## 2016-06-23 VITALS — BP 171/59 | HR 66 | Temp 98.0°F | Resp 18 | Ht 67.0 in | Wt 158.9 lb

## 2016-06-23 DIAGNOSIS — C49A3 Gastrointestinal stromal tumor of small intestine: Secondary | ICD-10-CM | POA: Diagnosis not present

## 2016-06-23 DIAGNOSIS — C49A9 Gastrointestinal stromal tumor of other sites: Secondary | ICD-10-CM | POA: Diagnosis not present

## 2016-06-23 LAB — CBC WITH DIFFERENTIAL/PLATELET
BASO%: 0.4 % (ref 0.0–2.0)
BASOS ABS: 0 10*3/uL (ref 0.0–0.1)
EOS%: 20.5 % — AB (ref 0.0–7.0)
Eosinophils Absolute: 1 10*3/uL — ABNORMAL HIGH (ref 0.0–0.5)
HCT: 32.3 % — ABNORMAL LOW (ref 38.4–49.9)
HGB: 10.9 g/dL — ABNORMAL LOW (ref 13.0–17.1)
LYMPH%: 19.2 % (ref 14.0–49.0)
MCH: 34.9 pg — ABNORMAL HIGH (ref 27.2–33.4)
MCHC: 33.7 g/dL (ref 32.0–36.0)
MCV: 103.5 fL — ABNORMAL HIGH (ref 79.3–98.0)
MONO#: 0.4 10*3/uL (ref 0.1–0.9)
MONO%: 8.8 % (ref 0.0–14.0)
NEUT#: 2.4 10*3/uL (ref 1.5–6.5)
NEUT%: 51.1 % (ref 39.0–75.0)
Platelets: 179 10*3/uL (ref 140–400)
RBC: 3.12 10*6/uL — ABNORMAL LOW (ref 4.20–5.82)
RDW: 13.8 % (ref 11.0–14.6)
WBC: 4.7 10*3/uL (ref 4.0–10.3)
lymph#: 0.9 10*3/uL (ref 0.9–3.3)

## 2016-06-23 LAB — COMPREHENSIVE METABOLIC PANEL
ALT: 17 U/L (ref 0–55)
AST: 21 U/L (ref 5–34)
Albumin: 3.8 g/dL (ref 3.5–5.0)
Alkaline Phosphatase: 84 U/L (ref 40–150)
Anion Gap: 8 mEq/L (ref 3–11)
BILIRUBIN TOTAL: 0.42 mg/dL (ref 0.20–1.20)
BUN: 15 mg/dL (ref 7.0–26.0)
CHLORIDE: 110 meq/L — AB (ref 98–109)
CO2: 27 meq/L (ref 22–29)
CREATININE: 1.3 mg/dL (ref 0.7–1.3)
Calcium: 8.7 mg/dL (ref 8.4–10.4)
EGFR: 49 mL/min/{1.73_m2} — ABNORMAL LOW (ref >90)
GLUCOSE: 91 mg/dL (ref 70–140)
Potassium: 4 mEq/L (ref 3.5–5.1)
Sodium: 145 mEq/L (ref 136–145)
Total Protein: 7.2 g/dL (ref 6.4–8.3)

## 2016-06-23 NOTE — Telephone Encounter (Signed)
Gave and pritned appt sched and avs for pt for Aug °

## 2016-06-23 NOTE — Progress Notes (Signed)
[facility] OFFICE PROGRESS NOTE   Diagnosis: Gastrointestinal stromal tumor  INTERVAL HISTORY:   [name] returns as scheduled. He continues [name] [name]. No diarrhea or rash. He feels well. The abdominal drain remains in place. He underwent a fistula tract check in radiology on 06/16/2016. The abscess cavity remains collapsed. A small fistula remains to the cecum. He is scheduled to see Dr. [name] to discuss management of the fistula. Objective:  Vital signs in last 24 hours:  Blood pressure 171/59, pulse 66, temperature 98 F (36.7 C), temperature source Oral, resp. rate 18, height 5' 7"  (1.702 m), weight 158 lb 14.4 oz (72.077 kg), SpO2 100 %.    HEENT: No thrush or ulcers, mild left conjunctival erythema Resp: Lungs clear bilaterally Cardio: Regular rate and rhythm GI: No hepatomegaly, no apparent ascites, nontender, right lower quadrant drain site with a gauze dressing Vascular: Trace edema at the left greater than right lower leg   Lab Results:  Lab Results  Component Value Date   WBC 4.7 06/23/2016   HGB 10.9* 06/23/2016   HCT 32.3* 06/23/2016   MCV 103.5* 06/23/2016   PLT 179 06/23/2016   NEUTROABS 2.4 06/23/2016     Medications: I have reviewed the patient's current medications.  Assessment/Plan: 1. 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 History of anemia, status post a nondiagnostic bone marrow biopsy 10/21/2014  3. NSTEMI March 2014  4. Admission 05/06/2015 with acute onset right abdomen pain-potentially  related to acute appendicitis versus pain from carcinomatosis  CT 05/13/2015 consistent with a right lower abdomen abscess, status post catheter drainage by interventional radiology 05/13/2015  Follow-up CT 05/19/2015 showed resolution of the abscess.  Follow-up evaluation in interventional radiology 06/01/2015 showed the abscess cavity had resolved. The abscess drainage catheter communicated with the cecum via the appendix. The catheter was left in place.  CT abdomen/pelvis 06/08/2015 showed the right pelvic drain in place without recurrent or residual surrounding fluid collection. Peritoneal metastasis with slight increase in small to moderate volume of abdominal pelvic ascites.  Drainage catheter injection 06/12/2015 showed residual tiny fistulous connection with the end of the decompressed abscess cavity within the right lower abdominal quadrant and the residual lumen of the appendix.  Paracentesis 06/12/2015 with 5 liters of fluid removed  Paracentesis 06/19/2015-negative cytology and culture  Removal of abscess drainage catheter 06/23/2015  CT 07/26/2015 with increased right abdominal wall inflammation and increased fluid collection at site of previous right abdomen abscess, ascites, and enlargement/inflammation of the appendix  Placement of right lower quadrant percutaneous drainage catheter 07/27/2015. Contrast injection confirmed persistent fistulous connection with the ill-defined fluid collection and the residual appendix and cecum. Paracentesis with 2 L of fluid removed.  Follow-up CT abdomen/pelvis 08/05/2015 with small to moderate left pleural effusion and moderate to large volume ascites with both appearing slightly decreased since the prior study. Right lower quadrant drainage catheter in place. No residual fluid collection around the drain or in the adjacent abdomen or pelvis. 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 Drainage  catheter left in place.  Tube evaluation 02/10/2016, persistent fistula  Tube evaluation 03/01/2016, continued fistula to the  cecum  Tube downsized 05/04/2016, no residual abscess seen  Collapsed abscess with a stable fistula to the cecum on imaging 06/16/2016   5. C. difficile colitis 05/10/2015  6. Right leg edema 12/18/2015. Negative venous Doppler.    Disposition:  [name] [name] appears stable. No clinical evidence for progression of the gastrointestinal stromal tumor. He will continue Gleevec. He will see Dr. [name] to discuss management of the persistent fistula.  [name] [name] will return for an office visit in 6 weeks.  [name] Coder, MD  06/23/2016  10:47 AM

## 2016-06-23 NOTE — Telephone Encounter (Signed)
Gave and printed appt sched and avs for pt for Aug °

## 2016-07-07 DIAGNOSIS — I2510 Atherosclerotic heart disease of native coronary artery without angina pectoris: Secondary | ICD-10-CM | POA: Diagnosis not present

## 2016-07-07 DIAGNOSIS — K4090 Unilateral inguinal hernia, without obstruction or gangrene, not specified as recurrent: Secondary | ICD-10-CM | POA: Diagnosis not present

## 2016-07-07 DIAGNOSIS — K651 Peritoneal abscess: Secondary | ICD-10-CM | POA: Diagnosis not present

## 2016-07-07 DIAGNOSIS — K432 Incisional hernia without obstruction or gangrene: Secondary | ICD-10-CM | POA: Diagnosis not present

## 2016-07-07 DIAGNOSIS — Z8619 Personal history of other infectious and parasitic diseases: Secondary | ICD-10-CM | POA: Diagnosis not present

## 2016-07-07 DIAGNOSIS — Z8509 Personal history of malignant neoplasm of other digestive organs: Secondary | ICD-10-CM | POA: Diagnosis not present

## 2016-07-08 ENCOUNTER — Other Ambulatory Visit: Payer: Self-pay | Admitting: General Surgery

## 2016-07-08 DIAGNOSIS — IMO0002 Reserved for concepts with insufficient information to code with codable children: Secondary | ICD-10-CM

## 2016-07-11 MED FILL — GLEEVEC 400 MG TABLET: 400 | 30 days supply | Qty: 30 | Fill #2

## 2016-07-15 ENCOUNTER — Ambulatory Visit
Admission: RE | Admit: 2016-07-15 | Discharge: 2016-07-15 | Disposition: A | Discharge status: Home / Self Care | Payer: Medicare Other | Source: Ambulatory Visit | Attending: General Surgery | Admitting: General Surgery

## 2016-07-15 DIAGNOSIS — L02211 Cutaneous abscess of abdominal wall: Secondary | ICD-10-CM | POA: Diagnosis not present

## 2016-07-15 DIAGNOSIS — IMO0002 Reserved for concepts with insufficient information to code with codable children: Secondary | ICD-10-CM

## 2016-07-15 MED ORDER — IOPAMIDOL (ISOVUE-300) INJECTION 61%
100.0000 mL | Freq: Once | INTRAVENOUS | Status: AC | PRN
  Administered 2016-07-15: 100 mL via INTRAVENOUS

## 2016-07-19 DIAGNOSIS — R630 Anorexia: Secondary | ICD-10-CM | POA: Diagnosis not present

## 2016-07-19 DIAGNOSIS — H109 Unspecified conjunctivitis: Secondary | ICD-10-CM | POA: Diagnosis not present

## 2016-07-19 DIAGNOSIS — I10 Essential (primary) hypertension: Secondary | ICD-10-CM | POA: Diagnosis not present

## 2016-07-19 DIAGNOSIS — E782 Mixed hyperlipidemia: Secondary | ICD-10-CM | POA: Diagnosis not present

## 2016-07-21 ENCOUNTER — Other Ambulatory Visit: Payer: Self-pay | Admitting: General Surgery

## 2016-07-21 DIAGNOSIS — K3533 Acute appendicitis with perforation and localized peritonitis, with abscess: Secondary | ICD-10-CM

## 2016-07-26 ENCOUNTER — Ambulatory Visit
Admission: RE | Admit: 2016-07-26 | Discharge: 2016-07-26 | Disposition: A | Discharge status: Home / Self Care | Payer: Medicare Other | Source: Ambulatory Visit | Attending: General Surgery | Admitting: General Surgery

## 2016-07-26 DIAGNOSIS — K3533 Acute appendicitis with perforation and localized peritonitis, with abscess: Secondary | ICD-10-CM

## 2016-07-26 DIAGNOSIS — K632 Fistula of intestine: Secondary | ICD-10-CM | POA: Diagnosis not present

## 2016-07-26 HISTORY — PX: IR GENERIC HISTORICAL: IMG1180011

## 2016-07-26 NOTE — Progress Notes (Signed)
Chief Complaint: Patient was seen in follow-up today for RLQ abscess at the request of [place]  Referring Physician(s): Ingram,Haywood  History of Present Illness: [name] [name] is a 82 y.o. male with a history of perforated appendicitis. He underwent initial placement of a percutaneous drainage catheter on 05/13/2015. There was a persistent fistula between the drainage catheter and the base of the cecum necessitating prolonged drain placement. At one point to drain was removed and the patient developed a recurrent abscess several days later necessitating replacement of the drain.  His most recent prior imaging on 07/07/2016 image straits no evidence of residual abscess cavity. Additionally, the drain has become displaced into the abdominal wall musculature.  Output is scant. The patient is not flushing the drain and has minimal (less than 10 mL daily) output of serous fluid. He denies fever, pain, chills or decreased appetite. He is currently on a 10 day course of oral antibiotics prescribed by his referring surgeon, Dr. [name], in preparation for tube removal.  Past Medical History:  Diagnosis Date  . Abdominal mass 02/2013  . Acute respiratory failure with hypoxia 09/12/2014  . CAD (coronary artery disease)   . CKD (chronic kidney disease), stage III   . Hemorrhagic shock 09/13/2014  . Hyperlipidemia    TAKES zOCOR DAILY  . Hypertension    TAKES LOTREL,HCTZ,AND METOPROLOL DAILY  . Intraperitoneal hemorrhage 09/17/2014  . NSTEMI (non-ST elevated myocardial infarction) 02/2013   "LIGHT: ONE MD SAID HE DID AND ONE SAID HE DIDN'T  . Pneumonia    MAR 2014  . Shortness of breath   . Stromal tumor of digestive system   . UTI (urinary tract infection)     Past Surgical History:  Procedure Laterality Date  . CARDIAC CATHETERIZATION  03-05-13  . COLONOSCOPY  09/08/2014   dr scooler  . CYST EXCISION  1962   wrist  . LAPAROTOMY N/A 06/04/2013   Procedure: EXPLORATORY LAPAROTOMY,  OPEN RESECTION OF MESENTERIC AND INTESTINAL MASS;  Surgeon: [name], MD;  Location: [facility];  Service: General;  Laterality: N/A;  Small Bowel Resection  . LEFT HEART CATHETERIZATION WITH CORONARY ANGIOGRAM N/A 03/05/2013   Procedure: LEFT HEART CATHETERIZATION WITH CORONARY ANGIOGRAM;  Surgeon: [name], MD;  Location: [facility] CATH LAB;  Service: Cardiovascular;  Laterality: N/A;    Allergies: Review of patient's allergies indicates no known allergies.  Medications: Prior to Admission medications   Medication Sig Start Date End Date Taking? Authorizing Provider  amLODipine (NORVASC) 5 MG tablet Take 5 mg by mouth daily. 03/17/15   Historical Provider, MD  furosemide (LASIX) 20 MG tablet Take 1 tablet (20 mg total) by mouth daily. 05/20/15   Hosie Poisson, MD  GLEEVEC 400 MG tablet Take 1 tablet (400 mg total) by mouth daily. Take with meals and large glass of water.Caution:Chemotherapy. 05/09/16   Ladell Pier, MD  HYDROcodone-acetaminophen (NORCO/VICODIN) 5-325 MG per tablet Take 1 tablet by mouth every 4 (four) hours as needed for moderate pain. Patient not taking: Reported on 02/12/2016 07/29/15   Debbe Odea, MD  metoprolol tartrate (LOPRESSOR) 25 MG tablet Take 1 tablet (25 mg total) by mouth 2 (two) times daily. 03/06/13   Costin Karlyne Greenspan, MD  saccharomyces boulardii (FLORASTOR) 250 MG capsule Take 1 capsule (250 mg total) by mouth 2 (two) times daily. Patient taking differently: Take 250 mg by mouth daily.  07/29/15   Debbe Odea, MD  simvastatin (ZOCOR) 20 MG tablet Take 20 mg by mouth every evening.  Historical Provider, MD     Family History  Problem Relation Age of Onset  . Skin cancer Brother   . Stroke Mother   . Skin cancer Father     Social History   Social History  . Marital status: Married    Spouse name: [name]  . Number of children: 2  . Years of education: N/A   Occupational History  . Retired     Writer   Social History Main Topics  .  Smoking status: Never Smoker  . Smokeless tobacco: Never Used  . Alcohol use No  . Drug use: No  . Sexual activity: No   Other Topics Concern  . Not on file   Social History Narrative   Married, wife [name]   Retired Environmental manager for 29 years   Independent in [place]   #2 grown sons    Review of Systems: A 12 point ROS discussed and pertinent positives are indicated in the HPI above.  All other systems are negative.  Review of Systems  Vital Signs: BP (!) 175/75   Pulse 67   Temp 98.4 F (36.9 C)   SpO2 100%   Physical Exam  Constitutional: He is oriented to person, place, and time. He appears well-developed and well-nourished. No distress.  HENT:  Head: Normocephalic and atraumatic.  Eyes: No scleral icterus.  Cardiovascular: Normal rate.   Pulmonary/Chest: Effort normal.  Abdominal: Soft. He exhibits no distension. There is no tenderness.  Neurological: He is alert and oriented to person, place, and time.  Skin: Skin is warm and dry.  Psychiatric: He has a normal mood and affect. His behavior is normal.  Nursing note and vitals reviewed.   Mallampati Score:     Imaging: Ct Abdomen Pelvis W Contrast  Result Date: 07/15/2016 CLINICAL DATA:  82 year old male with a history of ruptured appendicitis and known fistula. He currently has a right lower quadrant abscess drain. EXAM: CT ABDOMEN AND PELVIS WITH CONTRAST TECHNIQUE: Multidetector CT imaging of the abdomen and pelvis was performed using the standard protocol following bolus administration of intravenous contrast. CONTRAST:  149mL ISOVUE-300 IOPAMIDOL (ISOVUE-300) INJECTION 61% COMPARISON:  Multiple prior drain injections most recently 06/16/2016 FINDINGS: The right lower quadrant abscess drain has pulled back and is now primarily located within the musculature of the right lower quadrant abdominal wall. There is adjacent thickening of the terminal ileum and cecum without focal abscess collection. The  lung bases are clear. Borderline cardiomegaly. No pericardial effusion. Unremarkable visualized distal thoracic esophagus. Unremarkable CT appearance of the stomach, duodenum, spleen, adrenal glands and pancreas. Normal hepatic contour and morphology. No discrete hepatic lesion. The gallbladder is decompressed. Trace perihepatic ascites is present. This is non loculated. There is no enhancement of the pleural surface. Unremarkable appearance of the bilateral kidneys. No focal solid lesion, hydronephrosis or nephrolithiasis. Colonic diverticulosis. There is some mild stranding adjacent to the proximal sigmoid colon which is nonspecific in appearance. Trace free fluid is also present within the left pericolic gutter. Mild mesenteric edema. Right inguinal hernia containing a loop of small bowel. No bowel wall thickening or evidence of obstruction. Surgical changes of prior intestinal surgery with patent enteroenteric anastomosis noted in the right lower quadrant. Atherosclerotic calcifications throughout the abdominal aorta. No aneurysm. No significant stenosis. Unremarkable bladder, prostate gland and seminal vesicles. No free fluid or suspicious adenopathy. No acute fracture or aggressive appearing lytic or blastic osseous lesion. L5 degenerative disc disease. IMPRESSION: 1. The right lower  quadrant abdominal drainage catheter has been pulled back and is now within the musculature of the right lower quadrant abdominal wall. 2. No recurrent right lower quadrant intraperitoneal fluid collection or abscess. 3. There is some persistent reactive thickening of the terminal ileum and cecal base. 4. Mild perihepatic and left pericolic gutter ascites as well as mesenteric edema. 5. Extensive colonic diverticulosis with some stranding adjacent to the sigmoid colon. While it is difficult to exclude early uncomplicated diverticulitis radiographically, the patient's reported lack of clinical symptoms favors simple edema similar  to that in the small bowel mesentery. 6. Mild cardiomegaly. 7. Right inguinal hernia containing a loop of small bowel without evidence of bowel wall thickening or obstruction. 8.  Aortic Atherosclerosis (ICD10-170.0) 9. L5-S1 degenerative disc disease. Electronically Signed   By: [name] M.D.   On: 07/15/2016 13:35   Labs:  CBC:  Recent Labs  02/12/16 0951 03/25/16 0952 05/09/16 1139 06/23/16 0956  WBC 3.9* 3.9* 5.6 4.7  HGB 9.8* 9.8* 10.6* 10.9*  HCT 29.6* 29.5* 31.3* 32.3*  PLT 157 165 194 179    COAGS: No results for input(s): INR, APTT in the last 8760 hours.  BMP:  Recent Labs  07/28/15 0555  02/12/16 0951 03/25/16 0952 05/09/16 1139 06/23/16 0956  NA 139  < > 144 144 143 145  K 4.2  < > 4.1 3.9 4.3 4.0  CL 112*  --   --   --   --   --   CO2 23  < > 25 27 28 27   GLUCOSE 108*  < > 91 98 108 91  BUN 19  < > 19.2 17.1 18.9 15.0  CALCIUM 7.6*  < > 8.5 8.5 8.8 8.7  CREATININE 1.19  < > 1.2 1.2 1.3 1.3  GFRNONAA 54*  --   --   --   --   --   GFRAA >60  --   --   --   --   --   < > = values in this interval not displayed.  LIVER FUNCTION TESTS:  Recent Labs  02/12/16 0951 03/25/16 0952 05/09/16 1139 06/23/16 0956  BILITOT 0.45 0.62 0.43 0.42  AST 34 22 18 21   ALT 37 19 14 17   ALKPHOS 104 81 74 84  PROT 6.5 6.6 6.9 7.2  ALBUMIN 3.3* 3.4* 3.7 3.8    TUMOR MARKERS: No results for input(s): AFPTM, CEA, CA199, CHROMGRNA in the last 8760 hours.  Assessment and Plan:  No residual abscess on most recent CT imaging. Drain output is also minimal.  The drain was cut and removed today. Continue oral antibiotics for the next 8 days.    Electronically Signed: [name] 07/26/2016, 11:53 AM   I spent a total of  10 Minutes in face to face in clinical consultation, greater than 50% of which was counseling/coordinating care for RLQ abscess.

## 2016-08-05 ENCOUNTER — Ambulatory Visit (HOSPITAL_BASED_OUTPATIENT_CLINIC_OR_DEPARTMENT_OTHER): Payer: Medicare Other | Admitting: Oncology

## 2016-08-05 ENCOUNTER — Other Ambulatory Visit (HOSPITAL_BASED_OUTPATIENT_CLINIC_OR_DEPARTMENT_OTHER): Payer: Medicare Other

## 2016-08-05 ENCOUNTER — Telehealth: Payer: Self-pay | Admitting: Oncology

## 2016-08-05 VITALS — BP 163/52 | HR 65 | Temp 98.7°F | Resp 18 | Ht 67.0 in | Wt 161.7 lb

## 2016-08-05 DIAGNOSIS — C7989 Secondary malignant neoplasm of other specified sites: Secondary | ICD-10-CM

## 2016-08-05 DIAGNOSIS — C49A3 Gastrointestinal stromal tumor of small intestine: Secondary | ICD-10-CM | POA: Diagnosis not present

## 2016-08-05 LAB — CBC WITH DIFFERENTIAL/PLATELET
BASO%: 0.2 % (ref 0.0–2.0)
BASOS ABS: 0 10*3/uL (ref 0.0–0.1)
EOS ABS: 0.1 10*3/uL (ref 0.0–0.5)
EOS%: 2.2 % (ref 0.0–7.0)
HCT: 31.1 % — ABNORMAL LOW (ref 38.4–49.9)
HGB: 10.7 g/dL — ABNORMAL LOW (ref 13.0–17.1)
LYMPH%: 27.1 % (ref 14.0–49.0)
MCH: 35.7 pg — AB (ref 27.2–33.4)
MCHC: 34.4 g/dL (ref 32.0–36.0)
MCV: 103.7 fL — AB (ref 79.3–98.0)
MONO#: 0.6 10*3/uL (ref 0.1–0.9)
MONO%: 8.5 % (ref 0.0–14.0)
NEUT#: 4 10*3/uL (ref 1.5–6.5)
NEUT%: 62 % (ref 39.0–75.0)
Platelets: 221 10*3/uL (ref 140–400)
RBC: 3 10*6/uL — AB (ref 4.20–5.82)
RDW: 14.3 % (ref 11.0–14.6)
WBC: 6.5 10*3/uL (ref 4.0–10.3)
lymph#: 1.8 10*3/uL (ref 0.9–3.3)

## 2016-08-05 LAB — COMPREHENSIVE METABOLIC PANEL
ALBUMIN: 3.5 g/dL (ref 3.5–5.0)
ALK PHOS: 49 U/L (ref 40–150)
ALT: 15 U/L (ref 0–55)
AST: 18 U/L (ref 5–34)
Anion Gap: 7 mEq/L (ref 3–11)
BUN: 25.3 mg/dL (ref 7.0–26.0)
CHLORIDE: 113 meq/L — AB (ref 98–109)
CO2: 23 mEq/L (ref 22–29)
Calcium: 9.1 mg/dL (ref 8.4–10.4)
Creatinine: 1.4 mg/dL — ABNORMAL HIGH (ref 0.7–1.3)
EGFR: 46 mL/min/{1.73_m2} — AB (ref >90)
GLUCOSE: 98 mg/dL (ref 70–140)
POTASSIUM: 4.2 meq/L (ref 3.5–5.1)
SODIUM: 144 meq/L (ref 136–145)
Total Bilirubin: 0.61 mg/dL (ref 0.20–1.20)
Total Protein: 6.8 g/dL (ref 6.4–8.3)

## 2016-08-05 NOTE — Progress Notes (Signed)
[name] [name] OFFICE PROGRESS NOTE   Diagnosis: Gastrointestinal stromal tumor  INTERVAL HISTORY:   [name] returns as scheduled. He continues [place]. The abdominal drain was removed 07/26/2016. He completed a course of antibiotics. He feels well. Good appetite and energy level. No complaint. Minimal drainage from the drain site.  Objective:  Vital signs in last 24 hours:  Blood pressure (!) 163/52, pulse 65, temperature 98.7 F (37.1 C), temperature source Oral, resp. rate 18, height 5' 7"  (1.702 m), weight 161 lb 11.2 oz (73.3 kg), SpO2 100 %.   Resp: Lungs clear bilaterally Cardio: Regular rate and rhythm GI: No hepatosplenomegaly, no apparent ascites, drain site in the right lower abdomen with granulation tissue, right inguinal hernia Vascular: Trace low leg edema bilaterally      Lab Results:  Lab Results  Component Value Date   WBC 6.5 08/05/2016   HGB 10.7 (L) 08/05/2016   HCT 31.1 (L) 08/05/2016   MCV 103.7 (H) 08/05/2016   PLT 221 08/05/2016   NEUTROABS 4.0 08/05/2016     Medications: I have reviewed the patient's current medications.  Assessment/Plan: 1. 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 History of anemia, status post a nondiagnostic bone marrow biopsy 10/21/2014  3. NSTEMI March 2014  4. Admission 05/06/2015 with acute onset right abdomen pain-potentially related to acute appendicitis versus pain from  carcinomatosis  CT 05/13/2015 consistent with a right lower abdomen abscess, status post catheter drainage by interventional radiology 05/13/2015  Follow-up CT 05/19/2015 showed resolution of the abscess.  Follow-up evaluation in interventional radiology 06/01/2015 showed the abscess cavity had resolved. The abscess drainage catheter communicated with the cecum via the appendix. The catheter was left in place.  CT abdomen/pelvis 06/08/2015 showed the right pelvic drain in place without recurrent or residual surrounding fluid collection. Peritoneal metastasis with slight increase in small to moderate volume of abdominal pelvic ascites.  Drainage catheter injection 06/12/2015 showed residual tiny fistulous connection with the end of the decompressed abscess cavity within the right lower abdominal quadrant and the residual lumen of the appendix.  Paracentesis 06/12/2015 with 5 liters of fluid removed  Paracentesis 06/19/2015-negative cytology and culture  Removal of abscess drainage catheter 06/23/2015  CT 07/26/2015 with increased right abdominal wall inflammation and increased fluid collection at site of previous right abdomen abscess, ascites, and enlargement/inflammation of the appendix  Placement of right lower quadrant percutaneous drainage catheter 07/27/2015. Contrast injection confirmed persistent fistulous connection with the ill-defined fluid collection and the residual appendix and cecum. Paracentesis with 2 L of fluid removed.  Follow-up CT abdomen/pelvis 08/05/2015 with small to moderate left pleural effusion and moderate to large volume ascites with both appearing slightly decreased since the prior study. Right lower quadrant drainage catheter in place. No residual fluid collection around the drain or in the adjacent abdomen or pelvis. 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 Drainage catheter left in place.  Tube evaluation  02/10/2016, persistent fistula  Tube evaluation 03/01/2016, continued fistula to the cecum  Tube downsized 05/04/2016, no residual abscess seen  Collapsed  abscess with a stable fistula to the cecum on imaging 06/16/2016  Tube removed 07/26/2016   5. C. difficile colitis 05/10/2015  6. Right leg edema 12/18/2015. Negative venous Doppler.     Disposition:  [name] [name] appears stable. The abscess drain has been removed. No clinical evidence of active infection at present. No evidence for progression of the gastrointestinal stromal tumor. He will continue Gleevec. He will return for an office and lab visit in 6 weeks.  Betsy Coder, MD  08/05/2016  9:48 AM

## 2016-08-05 NOTE — Telephone Encounter (Signed)
Gave pt cal & avs °

## 2016-08-09 MED FILL — GLEEVEC 400 MG TABLET: 400 | 30 days supply | Qty: 30 | Fill #3

## 2016-08-10 DIAGNOSIS — K651 Peritoneal abscess: Secondary | ICD-10-CM | POA: Diagnosis not present

## 2016-08-16 DIAGNOSIS — Z8619 Personal history of other infectious and parasitic diseases: Secondary | ICD-10-CM | POA: Diagnosis not present

## 2016-08-16 DIAGNOSIS — K651 Peritoneal abscess: Secondary | ICD-10-CM | POA: Diagnosis not present

## 2016-08-16 DIAGNOSIS — Z8509 Personal history of malignant neoplasm of other digestive organs: Secondary | ICD-10-CM | POA: Diagnosis not present

## 2016-08-16 DIAGNOSIS — I2510 Atherosclerotic heart disease of native coronary artery without angina pectoris: Secondary | ICD-10-CM | POA: Diagnosis not present

## 2016-09-07 DIAGNOSIS — K651 Peritoneal abscess: Secondary | ICD-10-CM | POA: Diagnosis not present

## 2016-09-07 MED FILL — GLEEVEC 400 MG TABLET: 400 | 30 days supply | Qty: 30 | Fill #4

## 2016-09-15 ENCOUNTER — Other Ambulatory Visit (HOSPITAL_COMMUNITY): Payer: Self-pay | Admitting: General Surgery

## 2016-09-15 DIAGNOSIS — I2510 Atherosclerotic heart disease of native coronary artery without angina pectoris: Secondary | ICD-10-CM | POA: Diagnosis not present

## 2016-09-15 DIAGNOSIS — K432 Incisional hernia without obstruction or gangrene: Secondary | ICD-10-CM | POA: Diagnosis not present

## 2016-09-15 DIAGNOSIS — Z8509 Personal history of malignant neoplasm of other digestive organs: Secondary | ICD-10-CM | POA: Diagnosis not present

## 2016-09-15 DIAGNOSIS — K651 Peritoneal abscess: Secondary | ICD-10-CM | POA: Diagnosis not present

## 2016-09-15 DIAGNOSIS — K4090 Unilateral inguinal hernia, without obstruction or gangrene, not specified as recurrent: Secondary | ICD-10-CM | POA: Diagnosis not present

## 2016-09-15 DIAGNOSIS — Z8619 Personal history of other infectious and parasitic diseases: Secondary | ICD-10-CM | POA: Diagnosis not present

## 2016-09-16 ENCOUNTER — Other Ambulatory Visit (HOSPITAL_COMMUNITY): Payer: Self-pay | Admitting: General Surgery

## 2016-09-16 ENCOUNTER — Other Ambulatory Visit (HOSPITAL_BASED_OUTPATIENT_CLINIC_OR_DEPARTMENT_OTHER): Payer: Medicare Other

## 2016-09-16 ENCOUNTER — Telehealth: Payer: Self-pay | Admitting: Oncology

## 2016-09-16 DIAGNOSIS — C49A3 Gastrointestinal stromal tumor of small intestine: Secondary | ICD-10-CM

## 2016-09-16 DIAGNOSIS — K651 Peritoneal abscess: Secondary | ICD-10-CM

## 2016-09-16 LAB — CBC WITH DIFFERENTIAL/PLATELET
BASO%: 0.5 % (ref 0.0–2.0)
BASOS ABS: 0 10*3/uL (ref 0.0–0.1)
EOS ABS: 0.1 10*3/uL (ref 0.0–0.5)
EOS%: 3.1 % (ref 0.0–7.0)
HCT: 30.1 % — ABNORMAL LOW (ref 38.4–49.9)
HEMOGLOBIN: 10.2 g/dL — AB (ref 13.0–17.1)
LYMPH%: 22.5 % (ref 14.0–49.0)
MCH: 35.9 pg — AB (ref 27.2–33.4)
MCHC: 33.9 g/dL (ref 32.0–36.0)
MCV: 105.9 fL — ABNORMAL HIGH (ref 79.3–98.0)
MONO#: 0.6 10*3/uL (ref 0.1–0.9)
MONO%: 16.5 % — ABNORMAL HIGH (ref 0.0–14.0)
NEUT%: 57.4 % (ref 39.0–75.0)
NEUTROS ABS: 2.2 10*3/uL (ref 1.5–6.5)
Platelets: 199 10*3/uL (ref 140–400)
RBC: 2.85 10*6/uL — AB (ref 4.20–5.82)
RDW: 13.5 % (ref 11.0–14.6)
WBC: 3.8 10*3/uL — AB (ref 4.0–10.3)
lymph#: 0.9 10*3/uL (ref 0.9–3.3)

## 2016-09-16 LAB — COMPREHENSIVE METABOLIC PANEL
ALBUMIN: 3.3 g/dL — AB (ref 3.5–5.0)
ALK PHOS: 48 U/L (ref 40–150)
ALT: 26 U/L (ref 0–55)
AST: 24 U/L (ref 5–34)
Anion Gap: 8 mEq/L (ref 3–11)
BUN: 28.1 mg/dL — AB (ref 7.0–26.0)
CALCIUM: 8.7 mg/dL (ref 8.4–10.4)
CO2: 21 mEq/L — ABNORMAL LOW (ref 22–29)
CREATININE: 1.6 mg/dL — AB (ref 0.7–1.3)
Chloride: 115 mEq/L — ABNORMAL HIGH (ref 98–109)
EGFR: 38 mL/min/{1.73_m2} — ABNORMAL LOW (ref >90)
Glucose: 94 mg/dl (ref 70–140)
POTASSIUM: 4 meq/L (ref 3.5–5.1)
Sodium: 143 mEq/L (ref 136–145)
Total Bilirubin: 0.37 mg/dL (ref 0.20–1.20)
Total Protein: 6.5 g/dL (ref 6.4–8.3)

## 2016-09-16 NOTE — Telephone Encounter (Signed)
Appt schd given to patient.[name] 09/16/16

## 2016-09-20 ENCOUNTER — Ambulatory Visit (HOSPITAL_COMMUNITY)
Admission: RE | Admit: 2016-09-20 | Discharge: 2016-09-20 | Disposition: A | Discharge status: Home / Self Care | Payer: Medicare Other | Source: Ambulatory Visit | Attending: General Surgery | Admitting: General Surgery

## 2016-09-20 ENCOUNTER — Encounter (HOSPITAL_COMMUNITY): Payer: Self-pay

## 2016-09-20 DIAGNOSIS — I708 Atherosclerosis of other arteries: Secondary | ICD-10-CM | POA: Diagnosis not present

## 2016-09-20 DIAGNOSIS — K5730 Diverticulosis of large intestine without perforation or abscess without bleeding: Secondary | ICD-10-CM | POA: Insufficient documentation

## 2016-09-20 DIAGNOSIS — M48061 Spinal stenosis, lumbar region without neurogenic claudication: Secondary | ICD-10-CM | POA: Diagnosis not present

## 2016-09-20 DIAGNOSIS — K651 Peritoneal abscess: Secondary | ICD-10-CM

## 2016-09-20 DIAGNOSIS — I700 Atherosclerosis of aorta: Secondary | ICD-10-CM | POA: Diagnosis not present

## 2016-09-20 DIAGNOSIS — K4090 Unilateral inguinal hernia, without obstruction or gangrene, not specified as recurrent: Secondary | ICD-10-CM | POA: Insufficient documentation

## 2016-09-20 MED ORDER — IOPAMIDOL (ISOVUE-300) INJECTION 61%: 30.0000 mL | Freq: Once | INTRAVENOUS | Status: DC | PRN

## 2016-09-20 MED ORDER — IOPAMIDOL (ISOVUE-300) INJECTION 61%
100.0000 mL | Freq: Once | INTRAVENOUS | Status: AC | PRN
  Administered 2016-09-20: 80 mL via INTRAVENOUS

## 2016-09-20 MED ORDER — IOPAMIDOL (ISOVUE-300) INJECTION 61%: 50.0000 mL | Freq: Once | INTRAVENOUS | Status: DC | PRN

## 2016-09-21 ENCOUNTER — Other Ambulatory Visit: Payer: Self-pay | Admitting: *Deleted

## 2016-09-30 ENCOUNTER — Other Ambulatory Visit (HOSPITAL_BASED_OUTPATIENT_CLINIC_OR_DEPARTMENT_OTHER): Payer: Medicare Other

## 2016-09-30 ENCOUNTER — Telehealth: Payer: Self-pay | Admitting: Oncology

## 2016-09-30 ENCOUNTER — Ambulatory Visit (HOSPITAL_BASED_OUTPATIENT_CLINIC_OR_DEPARTMENT_OTHER): Payer: Medicare Other | Admitting: Oncology

## 2016-09-30 VITALS — BP 172/55 | HR 78 | Temp 98.2°F | Resp 18 | Ht 67.0 in | Wt 161.7 lb

## 2016-09-30 DIAGNOSIS — Z23 Encounter for immunization: Secondary | ICD-10-CM

## 2016-09-30 DIAGNOSIS — C49A9 Gastrointestinal stromal tumor of other sites: Secondary | ICD-10-CM

## 2016-09-30 DIAGNOSIS — C49A3 Gastrointestinal stromal tumor of small intestine: Secondary | ICD-10-CM

## 2016-09-30 LAB — CBC WITH DIFFERENTIAL/PLATELET
BASO%: 0.2 % (ref 0.0–2.0)
BASOS ABS: 0 10*3/uL (ref 0.0–0.1)
EOS ABS: 0.2 10*3/uL (ref 0.0–0.5)
EOS%: 3.1 % (ref 0.0–7.0)
HEMATOCRIT: 30 % — AB (ref 38.4–49.9)
HEMOGLOBIN: 10.2 g/dL — AB (ref 13.0–17.1)
LYMPH#: 1.6 10*3/uL (ref 0.9–3.3)
LYMPH%: 27 % (ref 14.0–49.0)
MCH: 35.8 pg — AB (ref 27.2–33.4)
MCHC: 34 g/dL (ref 32.0–36.0)
MCV: 105.3 fL — AB (ref 79.3–98.0)
MONO#: 0.4 10*3/uL (ref 0.1–0.9)
MONO%: 6.6 % (ref 0.0–14.0)
NEUT#: 3.7 10*3/uL (ref 1.5–6.5)
NEUT%: 63.1 % (ref 39.0–75.0)
PLATELETS: 247 10*3/uL (ref 140–400)
RBC: 2.85 10*6/uL — ABNORMAL LOW (ref 4.20–5.82)
RDW: 13.7 % (ref 11.0–14.6)
WBC: 5.8 10*3/uL (ref 4.0–10.3)

## 2016-09-30 LAB — COMPREHENSIVE METABOLIC PANEL
ALBUMIN: 3.6 g/dL (ref 3.5–5.0)
ALK PHOS: 54 U/L (ref 40–150)
ALT: 22 U/L (ref 0–55)
ANION GAP: 8 meq/L (ref 3–11)
AST: 23 U/L (ref 5–34)
BUN: 23.4 mg/dL (ref 7.0–26.0)
CALCIUM: 8.9 mg/dL (ref 8.4–10.4)
CO2: 26 mEq/L (ref 22–29)
Chloride: 111 mEq/L — ABNORMAL HIGH (ref 98–109)
Creatinine: 1.4 mg/dL — ABNORMAL HIGH (ref 0.7–1.3)
EGFR: 47 mL/min/{1.73_m2} — AB (ref >90)
Glucose: 107 mg/dl (ref 70–140)
POTASSIUM: 4.4 meq/L (ref 3.5–5.1)
Sodium: 145 mEq/L (ref 136–145)
Total Bilirubin: 0.31 mg/dL (ref 0.20–1.20)
Total Protein: 6.8 g/dL (ref 6.4–8.3)

## 2016-09-30 MED ORDER — INFLUENZA VAC SPLIT QUAD 0.5 ML IM SUSY
0.5000 mL | PREFILLED_SYRINGE | Freq: Once | INTRAMUSCULAR | Status: AC
  Administered 2016-09-30: 0.5 mL via INTRAMUSCULAR
  Filled 2016-09-30: qty 0.5

## 2016-09-30 NOTE — Progress Notes (Signed)
[name] OFFICE PROGRESS NOTE   Diagnosis: Gastrointestinal stromal tumor  INTERVAL HISTORY:   [name] [name] returns as scheduled. He feels well. He continues [place]. The abdominal drain site has healed. He underwent a CT of the pelvis on 09/20/2016 and there is no residual abscess. No ascites.  Objective:  Vital signs in last 24 hours:  Blood pressure (!) 172/55, pulse 78, temperature 98.2 F (36.8 C), temperature source Oral, resp. rate 18, height _0  (1.702 m), weight 161 lb 11.2 oz (73.3 kg), SpO2 100 %.    HEENT: No thrush or ulcers Resp: Lungs clear bilaterally Cardio: Regular rate and rhythm GI: No hepatomegaly, no mass, nontender, no apparent ascites, drain site with an overlying eschar at the right lower abdomen Vascular: No leg edema   Lab Results:  Lab Results  Component Value Date   WBC 5.8 09/30/2016   HGB 10.2 (L) 09/30/2016   HCT 30.0 (L) 09/30/2016   MCV 105.3 (H) 09/30/2016   PLT 247 09/30/2016   NEUTROABS 3.7 09/30/2016    Medications: I have reviewed the patient's current medications.  Assessment/Plan: 1. 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 History of anemia, status post a nondiagnostic bone marrow biopsy 10/21/2014  3. NSTEMI March 2014  4. Admission 05/06/2015 with acute onset right abdomen pain-potentially related to acute  appendicitis versus pain from carcinomatosis  CT 05/13/2015 consistent with a right lower abdomen abscess, status post catheter drainage by interventional radiology 05/13/2015  Follow-up CT 05/19/2015 showed resolution of the abscess.  Follow-up evaluation in interventional radiology 06/01/2015 showed the abscess cavity had resolved. The abscess drainage catheter communicated with the cecum via the appendix. The catheter was left in place.  CT abdomen/pelvis 06/08/2015 showed the right pelvic drain in place without recurrent or residual surrounding fluid collection. Peritoneal metastasis with slight increase in small to moderate volume of abdominal pelvic ascites.  Drainage catheter injection 06/12/2015 showed residual tiny fistulous connection with the end of the decompressed abscess cavity within the right lower abdominal quadrant and the residual lumen of the appendix.  Paracentesis 06/12/2015 with 5 liters of fluid removed  Paracentesis 06/19/2015-negative cytology and culture  Removal of abscess drainage catheter 06/23/2015  CT 07/26/2015 with increased right abdominal wall inflammation and increased fluid collection at site of previous right abdomen abscess, ascites, and enlargement/inflammation of the appendix  Placement of right lower quadrant percutaneous drainage catheter 07/27/2015. Contrast injection confirmed persistent fistulous connection with the ill-defined fluid collection and the residual appendix and cecum. Paracentesis with 2 L of fluid removed.  Follow-up CT abdomen/pelvis 08/05/2015 with small to moderate left pleural effusion and moderate to large volume ascites with both appearing slightly decreased since the prior study. Right lower quadrant drainage catheter in place. 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 Drainage catheter left in  place.  Tube evaluation 02/10/2016, persistent fistula  Tube evaluation 03/01/2016, continued fistula to the cecum  Tube downsized 05/04/2016, no residual abscess seen  Collapsed abscess with a stable fistula to the cecum on imaging 06/16/2016  Tube removed 07/26/2016   5. C. difficile colitis 05/10/2015  6. Right leg edema 12/18/2015. Negative venous Doppler.     Disposition:  [name] [name] appears stable. No clinical evidence for progression of the gastrointestinal stromal tumor. He will continue Gleevec. He will return for an office and lab visit in approximately 6 weeks.  Betsy Coder, MD  09/30/2016  12:15 PM

## 2016-09-30 NOTE — Telephone Encounter (Signed)
Avs report and appointment schedule given to patient, per 09/30/16 los. °

## 2016-10-07 DIAGNOSIS — X32XXXD Exposure to sunlight, subsequent encounter: Secondary | ICD-10-CM | POA: Diagnosis not present

## 2016-10-07 DIAGNOSIS — L570 Actinic keratosis: Secondary | ICD-10-CM | POA: Diagnosis not present

## 2016-10-07 DIAGNOSIS — D485 Neoplasm of uncertain behavior of skin: Secondary | ICD-10-CM | POA: Diagnosis not present

## 2016-10-07 DIAGNOSIS — C44311 Basal cell carcinoma of skin of nose: Secondary | ICD-10-CM | POA: Diagnosis not present

## 2016-10-10 MED FILL — GLEEVEC 400 MG TABLET: 400 | 30 days supply | Qty: 30 | Fill #5

## 2016-10-11 DIAGNOSIS — K651 Peritoneal abscess: Secondary | ICD-10-CM | POA: Diagnosis not present

## 2016-10-11 DIAGNOSIS — Z8619 Personal history of other infectious and parasitic diseases: Secondary | ICD-10-CM | POA: Diagnosis not present

## 2016-10-11 DIAGNOSIS — K432 Incisional hernia without obstruction or gangrene: Secondary | ICD-10-CM | POA: Diagnosis not present

## 2016-10-11 DIAGNOSIS — K4090 Unilateral inguinal hernia, without obstruction or gangrene, not specified as recurrent: Secondary | ICD-10-CM | POA: Diagnosis not present

## 2016-10-11 DIAGNOSIS — I2510 Atherosclerotic heart disease of native coronary artery without angina pectoris: Secondary | ICD-10-CM | POA: Diagnosis not present

## 2016-10-11 DIAGNOSIS — Z8509 Personal history of malignant neoplasm of other digestive organs: Secondary | ICD-10-CM | POA: Diagnosis not present

## 2016-11-07 MED FILL — GLEEVEC 400 MG TABLET: 400 | 30 days supply | Qty: 30 | Fill #6

## 2016-11-11 ENCOUNTER — Other Ambulatory Visit (HOSPITAL_BASED_OUTPATIENT_CLINIC_OR_DEPARTMENT_OTHER): Payer: Medicare Other

## 2016-11-11 ENCOUNTER — Ambulatory Visit (HOSPITAL_BASED_OUTPATIENT_CLINIC_OR_DEPARTMENT_OTHER): Payer: Medicare Other | Admitting: Oncology

## 2016-11-11 ENCOUNTER — Telehealth: Payer: Self-pay | Admitting: Oncology

## 2016-11-11 VITALS — BP 157/59 | HR 71 | Temp 98.2°F | Resp 16 | Ht 67.0 in | Wt 167.0 lb

## 2016-11-11 DIAGNOSIS — C49A3 Gastrointestinal stromal tumor of small intestine: Secondary | ICD-10-CM | POA: Diagnosis not present

## 2016-11-11 LAB — COMPREHENSIVE METABOLIC PANEL
ALBUMIN: 3.5 g/dL (ref 3.5–5.0)
ALK PHOS: 55 U/L (ref 40–150)
ALT: 23 U/L (ref 0–55)
AST: 25 U/L (ref 5–34)
Anion Gap: 9 mEq/L (ref 3–11)
BILIRUBIN TOTAL: 0.4 mg/dL (ref 0.20–1.20)
BUN: 20.9 mg/dL (ref 7.0–26.0)
CO2: 26 meq/L (ref 22–29)
Calcium: 8.6 mg/dL (ref 8.4–10.4)
Chloride: 110 mEq/L — ABNORMAL HIGH (ref 98–109)
Creatinine: 1.3 mg/dL (ref 0.7–1.3)
EGFR: 49 mL/min/{1.73_m2} — ABNORMAL LOW (ref >90)
GLUCOSE: 98 mg/dL (ref 70–140)
Potassium: 3.9 mEq/L (ref 3.5–5.1)
SODIUM: 145 meq/L (ref 136–145)
TOTAL PROTEIN: 6.5 g/dL (ref 6.4–8.3)

## 2016-11-11 LAB — CBC WITH DIFFERENTIAL/PLATELET
BASO%: 0.4 % (ref 0.0–2.0)
Basophils Absolute: 0 10*3/uL (ref 0.0–0.1)
EOS ABS: 0.2 10*3/uL (ref 0.0–0.5)
EOS%: 3 % (ref 0.0–7.0)
HCT: 31.1 % — ABNORMAL LOW (ref 38.4–49.9)
HEMOGLOBIN: 10.3 g/dL — AB (ref 13.0–17.1)
LYMPH%: 21.8 % (ref 14.0–49.0)
MCH: 36.2 pg — ABNORMAL HIGH (ref 27.2–33.4)
MCHC: 33 g/dL (ref 32.0–36.0)
MCV: 109.7 fL — ABNORMAL HIGH (ref 79.3–98.0)
MONO#: 0.5 10*3/uL (ref 0.1–0.9)
MONO%: 9.3 % (ref 0.0–14.0)
NEUT%: 65.5 % (ref 39.0–75.0)
NEUTROS ABS: 3.3 10*3/uL (ref 1.5–6.5)
Platelets: 202 10*3/uL (ref 140–400)
RBC: 2.84 10*6/uL — ABNORMAL LOW (ref 4.20–5.82)
RDW: 13.3 % (ref 11.0–14.6)
WBC: 5.1 10*3/uL (ref 4.0–10.3)
lymph#: 1.1 10*3/uL (ref 0.9–3.3)

## 2016-11-11 NOTE — Telephone Encounter (Signed)
Appointments scheduled, per 11/11/16 los. A copy of the AVS report and appointment schedule was given to the patient, per 11/11/16 los.  °

## 2016-11-11 NOTE — Progress Notes (Signed)
[facility] OFFICE PROGRESS NOTE   Diagnosis: Gastrointestinal stromal tumor  INTERVAL HISTORY:   Mr. Yim returns as scheduled. He continues [place]. He reports the right lower abdominal drain site "opens up "every 2-3 weeks. He has completed several courses of antibiotics. He had a small amount of bloody drainage from the site today. No ever. He feels well.  Objective:  Vital signs in last 24 hours:  Blood pressure (!) 157/59, pulse 71, temperature 98.2 F (36.8 C), temperature source Oral, resp. rate 16, height 5' 7" (1.702 m), weight 167 lb (75.8 kg), SpO2 100 %, peak flow (!) 0 L/min.    HEENT: No thrush or ulcers Resp: Lungs clear bilaterally Cardio: Regular rate and rhythm GI: No hepatosplenomegaly, no mass, nontender, 1/2 cm scab overlying the right lower quadrant drain site. I cannot express fluid or blood from the site today. Right inguinal hernia. Vascular: No leg edema Skin: No rash    Lab Results:  Lab Results  Component Value Date   WBC 5.1 11/11/2016   HGB 10.3 (L) 11/11/2016   HCT 31.1 (L) 11/11/2016   MCV 109.7 (H) 11/11/2016   PLT 202 11/11/2016   NEUTROABS 3.3 11/11/2016     Medications: I have reviewed the patient's current medications.  Assessment/Plan: 1. 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 History of anemia, status post a nondiagnostic  bone marrow biopsy 10/21/2014  3. NSTEMI March 2014  4. Admission 05/06/2015 with acute onset right abdomen pain-potentially related to acute appendicitis versus pain from carcinomatosis  CT 05/13/2015 consistent with a right lower abdomen abscess, status post catheter drainage by interventional radiology 05/13/2015  Follow-up CT 05/19/2015 showed resolution of the abscess.  Follow-up evaluation in interventional radiology 06/01/2015 showed the abscess cavity had resolved. The abscess drainage catheter communicated with the cecum via the appendix. The catheter was left in place.  CT abdomen/pelvis 06/08/2015 showed the right pelvic drain in place without recurrent or residual surrounding fluid collection. Peritoneal metastasis with slight increase in small to moderate volume of abdominal pelvic ascites.  Drainage catheter injection 06/12/2015 showed residual tiny fistulous connection with the end of the decompressed abscess cavity within the right lower abdominal quadrant and the residual lumen of the appendix.  Paracentesis 06/12/2015 with 5 liters of fluid removed  Paracentesis 06/19/2015-negative cytology and culture  Removal of abscess drainage catheter 06/23/2015  CT 07/26/2015 with increased right abdominal wall inflammation and increased fluid collection at site of previous right abdomen abscess, ascites, and enlargement/inflammation of the appendix  Placement of right lower quadrant percutaneous drainage catheter 07/27/2015. Contrast injection confirmed persistent fistulous connection with the ill-defined fluid collection and the residual appendix and cecum. Paracentesis with 2 L of fluid removed.  Follow-up CT abdomen/pelvis 08/05/2015 with small to moderate left pleural effusion and moderate to large volume ascites with both appearing slightly decreased since the prior study. Right lower quadrant drainage catheter in place. No residual fluid collection around the drain or in the  adjacent abdomen or pelvis.Catheter injection shows a fistula to  the colon probably related to appendiceal perforation. Drainage catheter left in place.  Tube evaluation 02/10/2016, persistent fistula  Tube evaluation 03/01/2016, continued fistula to the cecum  Tube downsized 05/04/2016, no residual abscess seen  Collapsed abscess with a stable fistula to the cecum on imaging 06/16/2016  Tube removed 07/26/2016   5. C. difficile colitis 05/10/2015  6. Right leg edema 12/18/2015. Negative venous Doppler.  Disposition:  Mr. Hogg appears stable. The plan is to continue [place] indefinitely. He will contact us or Dr. [name] if the drain site opens again. We could consider holding the [place] for a few weeks to see if this would facilitate healing. I think it is unlikely the [place] is contributing to delayed wound healing.  Mr. Sison will return for an office and lab visit in 2 months.  Betsy Coder, MD  11/11/2016  11:33 AM

## 2016-11-16 ENCOUNTER — Telehealth: Payer: Self-pay | Admitting: *Deleted

## 2016-11-16 DIAGNOSIS — Z08 Encounter for follow-up examination after completed treatment for malignant neoplasm: Secondary | ICD-10-CM | POA: Diagnosis not present

## 2016-11-16 DIAGNOSIS — Z85828 Personal history of other malignant neoplasm of skin: Secondary | ICD-10-CM | POA: Diagnosis not present

## 2016-11-16 NOTE — Telephone Encounter (Signed)
FYI "Derrall Braccia [phone].  My father was seen last week, called me stating he has not heard anything about Gleevec yet.  The nurse called [facility] but they were closed the day after Thanksgiving.  Has anything been completed for [place] covering [place] in 2018.  We were told the pharmacy will not cover [place] in 2018 and he is to be on this medicine indefinitely.  Call me or him with an update."

## 2016-11-17 NOTE — Telephone Encounter (Signed)
Late entry for 11/16/16: Spoke with pt's son. They received a letter from [employer] company [facility]) that [place] will not be covered in 2018. May need an appeal done.  Pt has been receiving Gleevec from [facility] with copay assistance from [employer].  Pt's son has already renewed his assistance through [employer]. Asks that we contact insurance company to be sure [place] coverage continues through 2018.

## 2016-11-18 ENCOUNTER — Other Ambulatory Visit: Payer: Self-pay | Admitting: *Deleted

## 2016-11-18 MED ORDER — IMATINIB MESYLATE 400 MG PO TABS: 400.0000 mg | ORAL_TABLET | Freq: Every day | ORAL | 0 refills | Status: DC

## 2016-11-18 NOTE — Progress Notes (Signed)
Prescription for imatinib sent to [facility] per order of Dr. [name].  Patient's son notified.

## 2016-11-18 NOTE — Telephone Encounter (Signed)
Per Evelena Peat in pharmacy, [facility] will not cover brand name Gleevec and prescription for generic Imatinib to be sent to [facility].  Prescription sent per order of Dr. [name].  Patient's son informed of above and appreciative of call back.

## 2016-12-06 MED FILL — GLEEVEC 400 MG TABLET: 400 | 30 days supply | Qty: 30 | Fill #7

## 2016-12-22 ENCOUNTER — Encounter: Payer: Self-pay | Admitting: Interventional Radiology

## 2017-01-06 MED FILL — GLEEVEC 400 MG TABLET: 400 | 30 days supply | Qty: 30 | Fill #8

## 2017-01-13 ENCOUNTER — Telehealth: Payer: Self-pay | Admitting: Oncology

## 2017-01-13 ENCOUNTER — Ambulatory Visit (HOSPITAL_BASED_OUTPATIENT_CLINIC_OR_DEPARTMENT_OTHER): Payer: Medicare Other | Admitting: Oncology

## 2017-01-13 ENCOUNTER — Other Ambulatory Visit (HOSPITAL_BASED_OUTPATIENT_CLINIC_OR_DEPARTMENT_OTHER): Payer: Medicare Other

## 2017-01-13 VITALS — BP 167/74 | HR 69 | Temp 98.1°F | Resp 18 | Ht 67.0 in | Wt 166.5 lb

## 2017-01-13 DIAGNOSIS — C49A9 Gastrointestinal stromal tumor of other sites: Secondary | ICD-10-CM

## 2017-01-13 DIAGNOSIS — C49A3 Gastrointestinal stromal tumor of small intestine: Secondary | ICD-10-CM | POA: Diagnosis not present

## 2017-01-13 LAB — COMPREHENSIVE METABOLIC PANEL
ALT: 19 U/L (ref 0–55)
AST: 24 U/L (ref 5–34)
Albumin: 4 g/dL (ref 3.5–5.0)
Alkaline Phosphatase: 69 U/L (ref 40–150)
Anion Gap: 9 mEq/L (ref 3–11)
BILIRUBIN TOTAL: 0.47 mg/dL (ref 0.20–1.20)
BUN: 19.4 mg/dL (ref 7.0–26.0)
CHLORIDE: 109 meq/L (ref 98–109)
CO2: 27 meq/L (ref 22–29)
CREATININE: 1.4 mg/dL — AB (ref 0.7–1.3)
Calcium: 9 mg/dL (ref 8.4–10.4)
EGFR: 45 mL/min/{1.73_m2} — ABNORMAL LOW (ref >90)
GLUCOSE: 104 mg/dL (ref 70–140)
Potassium: 4.4 mEq/L (ref 3.5–5.1)
SODIUM: 145 meq/L (ref 136–145)
TOTAL PROTEIN: 7 g/dL (ref 6.4–8.3)

## 2017-01-13 LAB — CBC WITH DIFFERENTIAL/PLATELET
BASO%: 0.4 % (ref 0.0–2.0)
Basophils Absolute: 0 10*3/uL (ref 0.0–0.1)
EOS%: 2.2 % (ref 0.0–7.0)
Eosinophils Absolute: 0.1 10*3/uL (ref 0.0–0.5)
HCT: 35.8 % — ABNORMAL LOW (ref 38.4–49.9)
HGB: 12.5 g/dL — ABNORMAL LOW (ref 13.0–17.1)
LYMPH#: 1.1 10*3/uL (ref 0.9–3.3)
LYMPH%: 17.6 % (ref 14.0–49.0)
MCH: 37.1 pg — ABNORMAL HIGH (ref 27.2–33.4)
MCHC: 35 g/dL (ref 32.0–36.0)
MCV: 106.1 fL — ABNORMAL HIGH (ref 79.3–98.0)
MONO#: 0.6 10*3/uL (ref 0.1–0.9)
MONO%: 9 % (ref 0.0–14.0)
NEUT%: 70.8 % (ref 39.0–75.0)
NEUTROS ABS: 4.4 10*3/uL (ref 1.5–6.5)
PLATELETS: 205 10*3/uL (ref 140–400)
RBC: 3.38 10*6/uL — AB (ref 4.20–5.82)
RDW: 12.7 % (ref 11.0–14.6)
WBC: 6.2 10*3/uL (ref 4.0–10.3)

## 2017-01-13 NOTE — Progress Notes (Signed)
[facility] OFFICE PROGRESS NOTE   Diagnosis: Gastrointestinal stromal tumor  INTERVAL HISTORY:   [name] returns as scheduled. He feels well. He is exercising. No diarrhea. The abdominal wound has completely healed. He continues [name] [name].  Objective:  Vital signs in last 24 hours:  Blood pressure (!) 167/74, pulse 69, temperature 98.1 F (36.7 C), temperature source Oral, resp. rate 18, height 5' 7"  (1.702 m), weight 166 lb 8 oz (75.5 kg), SpO2 100 %.    HEENT: No thrush or ulcers Resp: Lungs clear bilaterally Cardio: Regular rate and rhythm GI: No hepatosplenomegaly, no mass, nontender Vascular: Trace edema at the right lower leg  Skin: Mild fine rash over the trunk, the right lower quadrant drain site is completely healed   Portacath  Lab Results:  Lab Results  Component Value Date   WBC 6.2 01/13/2017   HGB 12.5 (L) 01/13/2017   HCT 35.8 (L) 01/13/2017   MCV 106.1 (H) 01/13/2017   PLT 205 01/13/2017   NEUTROABS 4.4 01/13/2017     Medications: I have reviewed the patient's current medications.  Assessment/Plan: 1. 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 History of anemia, status post a nondiagnostic bone marrow biopsy 10/21/2014  3. NSTEMI March 2014  4. Admission 05/06/2015 with acute onset right abdomen pain-potentially related to acute appendicitis  versus pain from carcinomatosis  CT 05/13/2015 consistent with a right lower abdomen abscess, status post catheter drainage by interventional radiology 05/13/2015  Follow-up CT 05/19/2015 showed resolution of the abscess.  Follow-up evaluation in interventional radiology 06/01/2015 showed the abscess cavity had resolved. The abscess drainage catheter communicated with the cecum via the appendix. The catheter was left in place.  CT abdomen/pelvis 06/08/2015 showed the right pelvic drain in place without recurrent or residual surrounding fluid collection. Peritoneal metastasis with slight increase in small to moderate volume of abdominal pelvic ascites.  Drainage catheter injection 06/12/2015 showed residual tiny fistulous connection with the end of the decompressed abscess cavity within the right lower abdominal quadrant and the residual lumen of the appendix.  Paracentesis 06/12/2015 with 5 liters of fluid removed  Paracentesis 06/19/2015-negative cytology and culture  Removal of abscess drainage catheter 06/23/2015  CT 07/26/2015 with increased right abdominal wall inflammation and increased fluid collection at site of previous right abdomen abscess, ascites, and enlargement/inflammation of the appendix  Placement of right lower quadrant percutaneous drainage catheter 07/27/2015. Contrast injection confirmed persistent fistulous connection with the ill-defined fluid collection and the residual appendix and cecum. Paracentesis with 2 L of fluid removed.  Follow-up CT abdomen/pelvis 08/05/2015 with small to moderate left pleural effusion and moderate to large volume ascites with both appearing slightly decreased since the prior study. Right lower quadrant drainage catheter in place. 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 Drainage catheter left in  place.  Tube evaluation 02/10/2016, persistent fistula  Tube evaluation 03/01/2016, continued fistula to the cecum  Tube downsized  05/04/2016, no residual abscess seen  Collapsed abscess with a stable fistula to the cecum on imaging 06/16/2016  Tube removed 07/26/2016   5. C. difficile colitis 05/10/2015  6. Right leg edema 12/18/2015. Negative venous Doppler.    Disposition:  [name] appears well. No clinical evidence for progression of the gastrointestinal stromal tumor. He will continue [place] indefinitely. [name] will return for an office and lab visit in 2 months.  Betsy Coder, MD  01/13/2017  2:19 PM

## 2017-01-13 NOTE — Telephone Encounter (Signed)
Appointments scheduled per 01/13/17 los. Patient was given a copy of the AVS report and appointment schedule per 01/13/17 los.  ° °

## 2017-01-20 DIAGNOSIS — E782 Mixed hyperlipidemia: Secondary | ICD-10-CM | POA: Diagnosis not present

## 2017-01-20 DIAGNOSIS — I10 Essential (primary) hypertension: Secondary | ICD-10-CM | POA: Diagnosis not present

## 2017-02-07 ENCOUNTER — Telehealth: Payer: Self-pay | Admitting: *Deleted

## 2017-02-07 NOTE — Telephone Encounter (Addendum)
Pt and son came to office to report they have again been told by [facility] that imatinib will no longer be covered. Per pt's son: pt needs brand name only because [employer] will only cover brand name [place]. Received PA request form from [facility] via fax. 1320: Completed form faxed to [facility].  Left message on voicemail informing pt.

## 2017-02-08 MED FILL — GLEEVEC 400 MG TABLET: 400 | 30 days supply | Qty: 30 | Fill #0

## 2017-03-06 ENCOUNTER — Telehealth: Payer: Self-pay | Admitting: *Deleted

## 2017-03-06 ENCOUNTER — Other Ambulatory Visit: Payer: Self-pay | Admitting: Oncology

## 2017-03-06 MED ORDER — GLEEVEC 400 MG PO TABS: ORAL_TABLET | ORAL | 0 refills | Status: DC

## 2017-03-06 MED FILL — GLEEVEC 400 MG TABLET: 400 | 30 days supply | Qty: 30 | Fill #0

## 2017-03-06 NOTE — Telephone Encounter (Signed)
Message received from patient's wife requesting refill for [place].  OK for refill for Gleevec per Dr. [name].  Call placed back to patient's wife to inform her that refill for [place] has been sent to [facility] [facility].  Patient's wife appreciative of call back.

## 2017-03-13 ENCOUNTER — Ambulatory Visit (HOSPITAL_BASED_OUTPATIENT_CLINIC_OR_DEPARTMENT_OTHER): Payer: Medicare Other | Admitting: Oncology

## 2017-03-13 ENCOUNTER — Other Ambulatory Visit (HOSPITAL_BASED_OUTPATIENT_CLINIC_OR_DEPARTMENT_OTHER): Payer: Medicare Other

## 2017-03-13 ENCOUNTER — Telehealth: Payer: Self-pay | Admitting: Oncology

## 2017-03-13 VITALS — BP 160/65 | HR 65 | Temp 98.2°F | Resp 18 | Ht 67.0 in | Wt 167.4 lb

## 2017-03-13 DIAGNOSIS — C49A3 Gastrointestinal stromal tumor of small intestine: Secondary | ICD-10-CM

## 2017-03-13 LAB — CBC WITH DIFFERENTIAL/PLATELET
BASO%: 0.2 % (ref 0.0–2.0)
BASOS ABS: 0 10*3/uL (ref 0.0–0.1)
EOS%: 2.1 % (ref 0.0–7.0)
Eosinophils Absolute: 0.1 10*3/uL (ref 0.0–0.5)
HCT: 33.5 % — ABNORMAL LOW (ref 38.4–49.9)
HGB: 11.3 g/dL — ABNORMAL LOW (ref 13.0–17.1)
LYMPH#: 1.2 10*3/uL (ref 0.9–3.3)
LYMPH%: 23.2 % (ref 14.0–49.0)
MCH: 35.4 pg — AB (ref 27.2–33.4)
MCHC: 33.7 g/dL (ref 32.0–36.0)
MCV: 105 fL — AB (ref 79.3–98.0)
MONO#: 0.5 10*3/uL (ref 0.1–0.9)
MONO%: 8.4 % (ref 0.0–14.0)
NEUT#: 3.5 10*3/uL (ref 1.5–6.5)
NEUT%: 66.1 % (ref 39.0–75.0)
Platelets: 183 10*3/uL (ref 140–400)
RBC: 3.19 10*6/uL — ABNORMAL LOW (ref 4.20–5.82)
RDW: 13.8 % (ref 11.0–14.6)
WBC: 5.4 10*3/uL (ref 4.0–10.3)
nRBC: 0 % (ref 0–0)

## 2017-03-13 LAB — COMPREHENSIVE METABOLIC PANEL
ALT: 16 U/L (ref 0–55)
AST: 21 U/L (ref 5–34)
Albumin: 3.9 g/dL (ref 3.5–5.0)
Alkaline Phosphatase: 66 U/L (ref 40–150)
Anion Gap: 8 mEq/L (ref 3–11)
BUN: 17.1 mg/dL (ref 7.0–26.0)
CHLORIDE: 108 meq/L (ref 98–109)
CO2: 27 mEq/L (ref 22–29)
Calcium: 9 mg/dL (ref 8.4–10.4)
Creatinine: 1.3 mg/dL (ref 0.7–1.3)
EGFR: 50 mL/min/{1.73_m2} — AB (ref >90)
Glucose: 102 mg/dl (ref 70–140)
POTASSIUM: 4.4 meq/L (ref 3.5–5.1)
SODIUM: 143 meq/L (ref 136–145)
Total Bilirubin: 0.39 mg/dL (ref 0.20–1.20)
Total Protein: 6.9 g/dL (ref 6.4–8.3)

## 2017-03-13 NOTE — Progress Notes (Signed)
[place] OFFICE PROGRESS NOTE   Diagnosis: Gastrointestinal stromal tumor  INTERVAL HISTORY:   [name] [name] returns as scheduled. He feels well. He continues [place]. No diarrhea. He exercises for 1 hour each day. The eschar at the right lower quadrant drain tube site recently opened and bled. This has resolved.  Objective:  Vital signs in last 24 hours:  Blood pressure (!) 160/65, pulse 65, temperature 98.2 F (36.8 C), temperature source Oral, resp. rate 18, height 5' 7"  (1.702 m), weight 167 lb 6.4 oz (75.9 kg), SpO2 99 %.    HEENT: No thrush or ulcers Resp: Lungs clear bilaterally Cardio: Regular rate and rhythm GI: No hepatosplenomegaly, no apparent ascites, nontender, no mass Vascular: The right lower leg is slightly larger than left side  Skin: Healed drain site at the right lower abdomen with an overlying eschar. No evidence of infection.     Lab Results:  Lab Results  Component Value Date   WBC 5.4 03/13/2017   HGB 11.3 (L) 03/13/2017   HCT 33.5 (L) 03/13/2017   MCV 105.0 (H) 03/13/2017   PLT 183 03/13/2017   NEUTROABS 3.5 03/13/2017    Medications: I have reviewed the patient's current medications.  Assessment/Plan: 1. 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 History of anemia, status post a nondiagnostic bone marrow biopsy 10/21/2014  3. NSTEMI  March 2014  4. Admission 05/06/2015 with acute onset right abdomen pain-potentially related to acute appendicitis versus pain from carcinomatosis  CT 05/13/2015 consistent with a right lower abdomen abscess, status post catheter drainage by interventional radiology 05/13/2015  Follow-up CT 05/19/2015 showed resolution of the abscess.  Follow-up evaluation in interventional radiology 06/01/2015 showed the abscess cavity had resolved. The abscess drainage catheter communicated with the cecum via the appendix. The catheter was left in place.  CT abdomen/pelvis 06/08/2015 showed the right pelvic drain in place without recurrent or residual surrounding fluid collection. Peritoneal metastasis with slight increase in small to moderate volume of abdominal pelvic ascites.  Drainage catheter injection 06/12/2015 showed residual tiny fistulous connection with the end of the decompressed abscess cavity within the right lower abdominal quadrant and the residual lumen of the appendix.  Paracentesis 06/12/2015 with 5 liters of fluid removed  Paracentesis 06/19/2015-negative cytology and culture  Removal of abscess drainage catheter 06/23/2015  CT 07/26/2015 with increased right abdominal wall inflammation and increased fluid collection at site of previous right abdomen abscess, ascites, and enlargement/inflammation of the appendix  Placement of right lower quadrant percutaneous drainage catheter 07/27/2015. Contrast injection confirmed persistent fistulous connection with the ill-defined fluid collection and the residual appendix and cecum. Paracentesis with 2 L of fluid removed.  Follow-up CT abdomen/pelvis 08/05/2015 with small to moderate left pleural effusion and moderate to large volume ascites with both appearing slightly decreased since the prior study. Right lower quadrant drainage catheter in place. 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 Drainage catheter  left in place.  Tube evaluation 02/10/2016, persistent fistula  Tube evaluation 03/01/2016, continued fistula to the cecum  Tube downsized 05/04/2016, no residual abscess seen  Collapsed abscess with a stable fistula to the cecum on imaging 06/16/2016  Tube removed 07/26/2016   5. C. difficile colitis 05/10/2015  6. Right leg edema 12/18/2015. Negative venous Doppler.   Disposition:  Mr. Granillo appears stable. There is no clinical evidence for progression of the gastrointestinal stromal tumor. He will continue Gleevec. He will return for an office and lab visit in 2 months.  15 minutes were spent with the patient today. The majority of the time was used for counseling and coordination of care.  Betsy Coder, MD  03/13/2017  1:09 PM

## 2017-03-13 NOTE — Telephone Encounter (Signed)
Gave patient avs report and appointments or May

## 2017-03-14 ENCOUNTER — Other Ambulatory Visit: Payer: Self-pay | Admitting: *Deleted

## 2017-03-14 MED ORDER — GLEEVEC 400 MG PO TABS: ORAL_TABLET | ORAL | 0 refills | Status: DC

## 2017-04-03 ENCOUNTER — Inpatient Hospital Stay (HOSPITAL_COMMUNITY)
Admission: EM | Admit: 2017-04-03 | Discharge: 2017-04-06 | DRG: 389 | Disposition: A | Discharge status: Home / Self Care | Payer: Medicare Other | Attending: Internal Medicine | Admitting: Internal Medicine

## 2017-04-03 ENCOUNTER — Observation Stay (HOSPITAL_COMMUNITY): Payer: Medicare Other

## 2017-04-03 ENCOUNTER — Emergency Department (HOSPITAL_COMMUNITY): Payer: Medicare Other

## 2017-04-03 ENCOUNTER — Encounter (HOSPITAL_COMMUNITY): Payer: Self-pay | Admitting: Emergency Medicine

## 2017-04-03 DIAGNOSIS — K56600 Partial intestinal obstruction, unspecified as to cause: Secondary | ICD-10-CM | POA: Diagnosis not present

## 2017-04-03 DIAGNOSIS — N183 Chronic kidney disease, stage 3 unspecified: Secondary | ICD-10-CM | POA: Diagnosis present

## 2017-04-03 DIAGNOSIS — K5669 Other partial intestinal obstruction: Secondary | ICD-10-CM | POA: Diagnosis not present

## 2017-04-03 DIAGNOSIS — Z4682 Encounter for fitting and adjustment of non-vascular catheter: Secondary | ICD-10-CM | POA: Diagnosis not present

## 2017-04-03 DIAGNOSIS — I5032 Chronic diastolic (congestive) heart failure: Secondary | ICD-10-CM | POA: Diagnosis present

## 2017-04-03 DIAGNOSIS — K4090 Unilateral inguinal hernia, without obstruction or gangrene, not specified as recurrent: Secondary | ICD-10-CM | POA: Diagnosis present

## 2017-04-03 DIAGNOSIS — I2510 Atherosclerotic heart disease of native coronary artery without angina pectoris: Secondary | ICD-10-CM | POA: Diagnosis present

## 2017-04-03 DIAGNOSIS — Z79899 Other long term (current) drug therapy: Secondary | ICD-10-CM

## 2017-04-03 DIAGNOSIS — R109 Unspecified abdominal pain: Secondary | ICD-10-CM | POA: Diagnosis not present

## 2017-04-03 DIAGNOSIS — K5651 Intestinal adhesions [bands], with partial obstruction: Secondary | ICD-10-CM | POA: Diagnosis not present

## 2017-04-03 DIAGNOSIS — I10 Essential (primary) hypertension: Secondary | ICD-10-CM | POA: Diagnosis not present

## 2017-04-03 DIAGNOSIS — D649 Anemia, unspecified: Secondary | ICD-10-CM | POA: Diagnosis not present

## 2017-04-03 DIAGNOSIS — C49A3 Gastrointestinal stromal tumor of small intestine: Secondary | ICD-10-CM | POA: Diagnosis not present

## 2017-04-03 DIAGNOSIS — R1011 Right upper quadrant pain: Secondary | ICD-10-CM | POA: Diagnosis not present

## 2017-04-03 DIAGNOSIS — I252 Old myocardial infarction: Secondary | ICD-10-CM | POA: Diagnosis not present

## 2017-04-03 DIAGNOSIS — R1110 Vomiting, unspecified: Secondary | ICD-10-CM

## 2017-04-03 DIAGNOSIS — Z4659 Encounter for fitting and adjustment of other gastrointestinal appliance and device: Secondary | ICD-10-CM

## 2017-04-03 DIAGNOSIS — C49A0 Gastrointestinal stromal tumor, unspecified site: Secondary | ICD-10-CM | POA: Diagnosis present

## 2017-04-03 DIAGNOSIS — Z431 Encounter for attention to gastrostomy: Secondary | ICD-10-CM | POA: Diagnosis not present

## 2017-04-03 DIAGNOSIS — I5033 Acute on chronic diastolic (congestive) heart failure: Secondary | ICD-10-CM | POA: Diagnosis not present

## 2017-04-03 DIAGNOSIS — I130 Hypertensive heart and chronic kidney disease with heart failure and stage 1 through stage 4 chronic kidney disease, or unspecified chronic kidney disease: Secondary | ICD-10-CM | POA: Diagnosis present

## 2017-04-03 DIAGNOSIS — E785 Hyperlipidemia, unspecified: Secondary | ICD-10-CM | POA: Diagnosis present

## 2017-04-03 DIAGNOSIS — R1013 Epigastric pain: Secondary | ICD-10-CM | POA: Diagnosis not present

## 2017-04-03 DIAGNOSIS — K2970 Gastritis, unspecified, without bleeding: Secondary | ICD-10-CM | POA: Diagnosis not present

## 2017-04-03 DIAGNOSIS — R197 Diarrhea, unspecified: Secondary | ICD-10-CM | POA: Diagnosis not present

## 2017-04-03 HISTORY — DX: Gastrointestinal stromal tumor of large intestine: C49.A4

## 2017-04-03 LAB — URINALYSIS, ROUTINE W REFLEX MICROSCOPIC
BILIRUBIN URINE: NEGATIVE
Bacteria, UA: NONE SEEN
GLUCOSE, UA: 150 mg/dL — AB
Ketones, ur: 20 mg/dL — AB
LEUKOCYTES UA: NEGATIVE
NITRITE: NEGATIVE
PH: 5 (ref 5.0–8.0)
Protein, ur: 30 mg/dL — AB
SPECIFIC GRAVITY, URINE: 1.021 (ref 1.005–1.030)
Squamous Epithelial / LPF: NONE SEEN

## 2017-04-03 LAB — CBC WITH DIFFERENTIAL/PLATELET
Basophils Absolute: 0 10*3/uL (ref 0.0–0.1)
Basophils Relative: 0 %
EOS ABS: 0 10*3/uL (ref 0.0–0.7)
Eosinophils Relative: 0 %
HEMATOCRIT: 34.2 % — AB (ref 39.0–52.0)
HEMOGLOBIN: 11.3 g/dL — AB (ref 13.0–17.0)
LYMPHS ABS: 0.2 10*3/uL — AB (ref 0.7–4.0)
Lymphocytes Relative: 2 %
MCH: 34.9 pg — ABNORMAL HIGH (ref 26.0–34.0)
MCHC: 33 g/dL (ref 30.0–36.0)
MCV: 105.6 fL — ABNORMAL HIGH (ref 78.0–100.0)
MONOS PCT: 7 %
Monocytes Absolute: 0.6 10*3/uL (ref 0.1–1.0)
NEUTROS ABS: 8.5 10*3/uL — AB (ref 1.7–7.7)
NEUTROS PCT: 91 %
Platelets: 211 10*3/uL (ref 150–400)
RBC: 3.24 MIL/uL — AB (ref 4.22–5.81)
RDW: 13.5 % (ref 11.5–15.5)
WBC: 9.4 10*3/uL (ref 4.0–10.5)

## 2017-04-03 LAB — HEPATIC FUNCTION PANEL
ALBUMIN: 3.8 g/dL (ref 3.5–5.0)
ALK PHOS: 55 U/L (ref 38–126)
ALT: 17 U/L (ref 17–63)
AST: 24 U/L (ref 15–41)
Bilirubin, Direct: 0.2 mg/dL (ref 0.1–0.5)
Indirect Bilirubin: 0.6 mg/dL (ref 0.3–0.9)
TOTAL PROTEIN: 6.6 g/dL (ref 6.5–8.1)
Total Bilirubin: 0.8 mg/dL (ref 0.3–1.2)

## 2017-04-03 LAB — I-STAT CHEM 8, ED
BUN: 21 mg/dL — ABNORMAL HIGH (ref 6–20)
CHLORIDE: 107 mmol/L (ref 101–111)
Calcium, Ion: 1.19 mmol/L (ref 1.15–1.40)
Creatinine, Ser: 1.4 mg/dL — ABNORMAL HIGH (ref 0.61–1.24)
Glucose, Bld: 165 mg/dL — ABNORMAL HIGH (ref 65–99)
HEMATOCRIT: 34 % — AB (ref 39.0–52.0)
Hemoglobin: 11.6 g/dL — ABNORMAL LOW (ref 13.0–17.0)
POTASSIUM: 4.2 mmol/L (ref 3.5–5.1)
Sodium: 141 mmol/L (ref 135–145)
TCO2: 27 mmol/L (ref 0–100)

## 2017-04-03 LAB — I-STAT TROPONIN, ED: TROPONIN I, POC: 0.02 ng/mL (ref 0.00–0.08)

## 2017-04-03 LAB — TROPONIN I: Troponin I: 0.03 ng/mL (ref <0.03)

## 2017-04-03 MED ORDER — MORPHINE SULFATE (PF) 4 MG/ML IV SOLN
2.0000 mg | INTRAVENOUS | Status: DC | PRN
  Administered 2017-04-03 (×4): 2 mg via INTRAVENOUS
  Filled 2017-04-03 (×4): qty 1

## 2017-04-03 MED ORDER — ONDANSETRON HCL 4 MG PO TABS: 4.0000 mg | ORAL_TABLET | Freq: Four times a day (QID) | ORAL | Status: DC | PRN

## 2017-04-03 MED ORDER — BENZOCAINE (TOPICAL) 20 % EX AERO
INHALATION_SPRAY | Freq: Four times a day (QID) | CUTANEOUS | Status: DC | PRN
Start: 2017-04-03 — End: 2017-04-03
  Administered 2017-04-03: 06:00:00 via OROMUCOSAL
  Filled 2017-04-03: qty 57

## 2017-04-03 MED ORDER — ONDANSETRON HCL 4 MG/2ML IJ SOLN
4.0000 mg | Freq: Four times a day (QID) | INTRAMUSCULAR | Status: DC | PRN
  Administered 2017-04-03 – 2017-04-04 (×2): 4 mg via INTRAVENOUS
  Filled 2017-04-03 (×2): qty 2

## 2017-04-03 MED ORDER — SODIUM CHLORIDE 0.9 % IV SOLN
INTRAVENOUS | Status: DC
  Administered 2017-04-03 – 2017-04-04 (×3): via INTRAVENOUS

## 2017-04-03 MED ORDER — METOPROLOL TARTRATE 5 MG/5ML IV SOLN
5.0000 mg | Freq: Two times a day (BID) | INTRAVENOUS | Status: DC
  Administered 2017-04-03 – 2017-04-04 (×2): 5 mg via INTRAVENOUS
  Filled 2017-04-03 (×2): qty 5

## 2017-04-03 MED ORDER — ENOXAPARIN SODIUM 40 MG/0.4ML ~~LOC~~ SOLN
40.0000 mg | SUBCUTANEOUS | Status: DC
  Administered 2017-04-03 – 2017-04-05 (×3): 40 mg via SUBCUTANEOUS
  Filled 2017-04-03 (×3): qty 0.4

## 2017-04-03 MED ORDER — ONDANSETRON HCL 4 MG/2ML IJ SOLN
4.0000 mg | Freq: Once | INTRAMUSCULAR | Status: AC
  Administered 2017-04-03: 4 mg via INTRAVENOUS
  Filled 2017-04-03: qty 2

## 2017-04-03 MED ORDER — ORAL CARE MOUTH RINSE
15.0000 mL | Freq: Two times a day (BID) | OROMUCOSAL | Status: DC
  Administered 2017-04-04 – 2017-04-06 (×2): 15 mL via OROMUCOSAL

## 2017-04-03 MED ORDER — OLOPATADINE HCL 0.1 % OP SOLN
1.0000 [drp] | Freq: Two times a day (BID) | OPHTHALMIC | Status: DC
  Administered 2017-04-03 – 2017-04-06 (×6): 1 [drp] via OPHTHALMIC
  Filled 2017-04-03: qty 5

## 2017-04-03 MED ORDER — GI COCKTAIL ~~LOC~~
30.0000 mL | Freq: Once | ORAL | Status: AC
  Administered 2017-04-03: 30 mL via ORAL
  Filled 2017-04-03: qty 30

## 2017-04-03 NOTE — ED Provider Notes (Signed)
[facility] DEPT Provider Note   CSN: [id number] Arrival date & time: 04/03/17  0103  By signing my name below, I, Higinio Plan, attest that this documentation has been prepared under the direction and in the presence of Kirklin Mcduffee, MD . Electronically Signed: Higinio Plan, Scribe. 04/03/2017. 1:11 AM.  History   Chief Complaint Chief Complaint  Patient presents with  . Abdominal Pain   The history is provided by the patient. No language interpreter was used.   HPI Comments: Mister Krahenbuhl is a 83 y.o. male with PMHx of ventral abdominal hernia, CAD, CKD, and HTN, brought in by EMS to the Emergency Department complaining of gradually worsening, 7/10, epigastric abdominal pain described as "bad indigestion" that began at ~9:00 PM yesterday evening. Pt reports associated diarrhea that began ~12 hours PTA and shortness of breath that began this morning. He denies any nausea, vomiting, dysuria, urinary frequency, or blood in his stool.   Past Medical History:  Diagnosis Date  . Abdominal mass 02/2013  . Acute respiratory failure with hypoxia ([place]) 09/12/2014  . CAD (coronary artery disease)   . CKD (chronic kidney disease), stage III   . Hemorrhagic shock 09/13/2014  . Hyperlipidemia    TAKES zOCOR DAILY  . Hypertension    TAKES LOTREL,HCTZ,AND METOPROLOL DAILY  . Intraperitoneal hemorrhage 09/17/2014  . NSTEMI (non-ST elevated myocardial infarction) ([place]) 02/2013   "LIGHT: ONE MD SAID HE DID AND ONE SAID HE DIDN'T  . Pneumonia    MAR 2014  . Shortness of breath   . Stromal tumor of digestive system ([place])   . UTI (urinary tract infection)     Patient Active Problem List   Diagnosis Date Noted  . Abscess of abdominal cavity ([place])   . Ascites   . Sepsis ([place]) 07/26/2015  . Abdominal abscess   . C. difficile diarrhea 05/10/2015  . Urinary retention 05/07/2015  . CKD (chronic kidney disease), stage III   . SIRS (systemic inflammatory response syndrome) ([place]) 05/06/2015  . Epigastric  pain   . Abdominal pain 01/13/2015  . Abdominal carcinomatosis ([place]) 10/16/2014  . Diastolic CHF, acute on chronic ([facility]) 09/17/2014  . Hyperlipidemia   . CAD (coronary artery disease)   . Hypertension   . GIST (gastrointestinal stromal tumor) of small bowel, malignant ([place]) 06/20/2013  . Acute kidney failure ([place]) 02/28/2013  . NSTEMI (non-ST elevated myocardial infarction) ([place]) 02/27/2013  . Chronic anemia 02/27/2013    Past Surgical History:  Procedure Laterality Date  . CARDIAC CATHETERIZATION  03-05-13  . COLONOSCOPY  09/08/2014   dr scooler  . CYST EXCISION  1962   wrist  . IR GENERIC HISTORICAL  05/17/2016   IR RADIOLOGIST EVAL & MGMT 05/17/2016 [name], MD [facility] INTERV RAD  . IR GENERIC HISTORICAL  07/26/2016   IR RADIOLOGIST EVAL & MGMT 07/26/2016 [name], MD [facility] INTERV RAD  . LAPAROTOMY N/A 06/04/2013   Procedure: EXPLORATORY LAPAROTOMY, OPEN RESECTION OF MESENTERIC AND INTESTINAL MASS;  Surgeon: [name], MD;  Location: [name];  Service: General;  Laterality: N/A;  Small Bowel Resection  . LEFT HEART CATHETERIZATION WITH CORONARY ANGIOGRAM N/A 03/05/2013   Procedure: LEFT HEART CATHETERIZATION WITH CORONARY ANGIOGRAM;  Surgeon: [name], MD;  Location: [facility] CATH LAB;  Service: Cardiovascular;  Laterality: N/A;    Home Medications    Prior to Admission medications   Medication Sig Start Date End Date Taking? Authorizing Provider  amLODipine (NORVASC) 5 MG tablet Take 5 mg by mouth daily. 03/17/15  Historical Provider, MD  furosemide (LASIX) 20 MG tablet Take 1 tablet (20 mg total) by mouth daily. 05/20/15   Hosie Poisson, MD  GLEEVEC 400 MG tablet TAKE 1 TABLET BY MOUTH DAILY. TAKE WITH MEALS AND LARGE GLASS OF WATER. CAUTION:CHEMOTHERAPY. 03/14/17   Ladell Pier, MD  metoprolol tartrate (LOPRESSOR) 25 MG tablet Take 1 tablet (25 mg total) by mouth 2 (two) times daily. 03/06/13   Costin Karlyne Greenspan, MD  simvastatin (ZOCOR) 20 MG tablet Take 20 mg by  mouth every evening.    Historical Provider, MD    Family History Family History  Problem Relation Age of Onset  . Skin cancer Brother   . Stroke Mother   . Skin cancer Father     Social History Social History  Substance Use Topics  . Smoking status: Never Smoker  . Smokeless tobacco: Never Used  . Alcohol use No   Allergies   Patient has no known allergies.  Review of Systems Review of Systems  Respiratory: Positive for shortness of breath.   Gastrointestinal: Positive for abdominal pain and diarrhea. Negative for blood in stool, nausea and vomiting.  Genitourinary: Negative for dysuria and frequency.  All other systems reviewed and are negative.  Physical Exam Updated Vital Signs BP (!) 152/51   Pulse 86   Temp 98.8 F (37.1 C) (Oral)   Resp 20   SpO2 99%   Physical Exam  Constitutional: He appears well-developed and well-nourished.  HENT:  Head: Normocephalic.  Mouth/Throat: Oropharynx is clear and moist. No oropharyngeal exudate.  Moist mucous membranes, no exudate.   Eyes: Conjunctivae and EOM are normal. Pupils are equal, round, and reactive to light. Right eye exhibits no discharge. Left eye exhibits no discharge. No scleral icterus.  Neck: Normal range of motion. Neck supple. No JVD present. No tracheal deviation present.  Trachea is midline. No stridor or carotid bruits.  Cardiovascular: Normal rate, regular rhythm, normal heart sounds and intact distal pulses.   No murmur heard. Pulmonary/Chest: Effort normal and breath sounds normal. No stridor. No respiratory distress. He has no wheezes. He has no rales.  Lungs CTA bilaterally.  Abdominal: Soft. Bowel sounds are normal. He exhibits no distension. There is no tenderness. There is no rebound and no guarding.  Gassy throughout.   Musculoskeletal: Normal range of motion. He exhibits no edema or tenderness.  All compartments are soft. No edema. No palpable cords.   Lymphadenopathy:    He has no cervical  adenopathy.  Neurological: He is alert. He has normal reflexes. He displays normal reflexes.  Skin: Skin is warm and dry. Capillary refill takes less than 2 seconds.  Psychiatric: He has a normal mood and affect. His behavior is normal.  Nursing note and vitals reviewed.  ED Treatments / Results  DIAGNOSTIC STUDIES:  Oxygen Saturation is 99% on RA, normal by my interpretation.    COORDINATION OF CARE:  1:08 AM Discussed treatment plan with pt at bedside and pt agreed to plan.  Labs (all labs ordered are listed, but only abnormal results are displayed)  Results for orders placed or performed during the hospital encounter of 04/03/17  CBC with Differential/Platelet  Result Value Ref Range   WBC 9.4 4.0 - 10.5 K/uL   RBC 3.24 (L) 4.22 - 5.81 MIL/uL   Hemoglobin 11.3 (L) 13.0 - 17.0 g/dL   HCT 34.2 (L) 39.0 - 52.0 %   MCV 105.6 (H) 78.0 - 100.0 fL   MCH 34.9 (H) 26.0 - 34.0  pg   MCHC 33.0 30.0 - 36.0 g/dL   RDW 13.5 11.5 - 15.5 %   Platelets 211 150 - 400 K/uL   Neutrophils Relative % 91 %   Neutro Abs 8.5 (H) 1.7 - 7.7 K/uL   Lymphocytes Relative 2 %   Lymphs Abs 0.2 (L) 0.7 - 4.0 K/uL   Monocytes Relative 7 %   Monocytes Absolute 0.6 0.1 - 1.0 K/uL   Eosinophils Relative 0 %   Eosinophils Absolute 0.0 0.0 - 0.7 K/uL   Basophils Relative 0 %   Basophils Absolute 0.0 0.0 - 0.1 K/uL  Urinalysis, Routine w reflex microscopic  Result Value Ref Range   Color, Urine YELLOW YELLOW   APPearance CLEAR CLEAR   Specific Gravity, Urine 1.021 1.005 - 1.030   pH 5.0 5.0 - 8.0   Glucose, UA 150 (A) NEGATIVE mg/dL   Hgb urine dipstick SMALL (A) NEGATIVE   Bilirubin Urine NEGATIVE NEGATIVE   Ketones, ur 20 (A) NEGATIVE mg/dL   Protein, ur 30 (A) NEGATIVE mg/dL   Nitrite NEGATIVE NEGATIVE   Leukocytes, UA NEGATIVE NEGATIVE   RBC / HPF 0-5 0 - 5 RBC/hpf   WBC, UA 0-5 0 - 5 WBC/hpf   Bacteria, UA NONE SEEN NONE SEEN   Squamous Epithelial / LPF NONE SEEN NONE SEEN   Mucous PRESENT      Hyaline Casts, UA PRESENT   Hepatic function panel  Result Value Ref Range   Total Protein 6.6 6.5 - 8.1 g/dL   Albumin 3.8 3.5 - 5.0 g/dL   AST 24 15 - 41 U/L   ALT 17 17 - 63 U/L   Alkaline Phosphatase 55 38 - 126 U/L   Total Bilirubin 0.8 0.3 - 1.2 mg/dL   Bilirubin, Direct 0.2 0.1 - 0.5 mg/dL   Indirect Bilirubin 0.6 0.3 - 0.9 mg/dL  I-stat troponin, ED  Result Value Ref Range   Troponin i, poc 0.02 0.00 - 0.08 ng/mL   Comment 3          I-Stat Chem 8, ED  Result Value Ref Range   Sodium 141 135 - 145 mmol/L   Potassium 4.2 3.5 - 5.1 mmol/L   Chloride 107 101 - 111 mmol/L   BUN 21 (H) 6 - 20 mg/dL   Creatinine, Ser 1.40 (H) 0.61 - 1.24 mg/dL   Glucose, Bld 165 (H) 65 - 99 mg/dL   Calcium, Ion 1.19 1.15 - 1.40 mmol/L   TCO2 27 0 - 100 mmol/L   Hemoglobin 11.6 (L) 13.0 - 17.0 g/dL   HCT 34.0 (L) 39.0 - 52.0 %   Dg Abdomen 1 View  Result Date: 04/03/2017 CLINICAL DATA:  NG tube placement EXAM: ABDOMEN - 1 VIEW COMPARISON:  CT abdomen and pelvis 04/03/2017 FINDINGS: The enteric tube is not visualized, suggesting location in the upper esophagus. Gas-filled dilated small bowel suggests small bowel obstruction. Surgical clips are present. Degenerative changes in the spine. IMPRESSION: Enteric tube is not visualized suggesting location in the upper esophagus off the field of view. Gas distended small bowel consistent with obstruction. Electronically Signed   By: Lucienne Capers M.D.   On: 04/03/2017 06:32   Ct Renal Stone Study  Result Date: 04/03/2017 CLINICAL DATA:  Epigastric abdominal pain and diarrhea EXAM: CT ABDOMEN AND PELVIS WITHOUT CONTRAST TECHNIQUE: Multidetector CT imaging of the abdomen and pelvis was performed following the standard protocol without IV contrast. COMPARISON:  09/20/2016, 07/15/2016 FINDINGS: Lower chest: Lung bases demonstrate no acute consolidation  or pleural effusion. Borderline heart size. Small esophageal hiatal hernia. Hepatobiliary: Calcified  gallstone. Contracted gallbladder. No focal hepatic abnormality. No biliary dilatation. Pancreas: Unremarkable. No pancreatic ductal dilatation or surrounding inflammatory changes. Spleen: Normal in size without focal abnormality. Adrenals/Urinary Tract: Adrenal glands are within normal limits. Kidneys show no hydronephrosis. Bladder unremarkable. Stomach/Bowel: The stomach is slightly enlarged. Multiple fluid-filled loops of slightly dilated small bowel measuring up to 3.2 cm. Bowel anastomosis in the right lower quadrant with slightly enlarged segment of bowel at the suture line. Some dilated bowel leading up to the bowel containing right inguinal hernia. Diverticular disease of the colon. No colon wall thickening. Vascular/Lymphatic: Atherosclerotic vascular calcifications. No grossly enlarged lymph nodes. Reproductive: Prostate is unremarkable. Other: No free air. Small amount of ascites in the right upper quadrant adjacent to the liver Musculoskeletal: Degenerative changes. No acute or suspicious bone lesion. IMPRESSION: 1. Multiple loops of fluid-filled slightly enlarged small bowel suggesting early or partial bowel obstruction ; this may be related to the right inguinal hernia as there appears to be some dilated small bowel upstream to the hernia sac. 2. Small amount of ascites adjacent to the liver 3. Negative for kidney stones or hydronephrosis 4. Gallstones Electronically Signed   By: Donavan Foil M.D.   On: 04/03/2017 03:51     EKG Interpretation  Date/Time:  Monday Celesta Funderburk 16 2018 01:34:31 EDT Ventricular Rate:  86 PR Interval:    QRS Duration: 103 QT Interval:  392 QTC Calculation: 469 R Axis:     Text Interpretation:  Sinus rhythm Ventricular premature complex Confirmed by [facility]  MD, Lu Paradise ([id number]) on 04/03/2017 2:04:24 AM       Procedures Procedures (including critical care time)  Medications Ordered in ED  Medications  benzocaine (HURRICAINE) 20 % oral spray ( Mouth/Throat  Given 04/03/17 0538)  ondansetron (ZOFRAN) injection 4 mg (4 mg Intravenous Given 04/03/17 0137)  gi cocktail (Maalox,Lidocaine,Donnatal) (30 mLs Oral Given 04/03/17 0248)     Case d/w Dr. Barry Dienes who will see the patient in consult.    Final Clinical Impressions(s) / ED Diagnoses  SBO with diarrhea: Will admit to medicine.      Veatrice Kells, MD 04/03/17 [phone]

## 2017-04-03 NOTE — H&P (Signed)
History and Physical  Patient Name: [name] [name]     YKD:[id number]    DOB: [birth year]    DOA: 04/03/2017 PCP: Abigail Miyamoto, MD   Patient coming from: Home  Chief Complaint: Abdominal pain, diarrhea  HPI: Greg Cratty is a 83 y.o. 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  The patient was in his usual state of health until after lunch yesterday when he developed several episodes of watery loose stools. Afterwards he started to develop bloating and abdominal pain, which progressed to heartburn-like indigestion until he came to the ER.   ED course: -Afebrile, heart rate 86, respirations 22, BP 152/51, pulse ox normal -Na 141, K 4.2, Cr 1.4 (baseline 1.3-1.5), WBC 9.4K, Hgb 11.3 -Troponin negative -ECG showed no ischemic changes -CT abdomen without contrast was obtained that showed multiple loops of dilated small bowel leading to his bowel-containing right inguinal hernia -An NG tube was placed and TRH were asked to evaluate for SBO    He had a small section of small bowel resected in 2013 or 2014 when he was first diagnosed with GIST.  He has never had a SBO.  He has had a long-standing right inguinal hernia, without ever having incarceration, and the hernia is not red and painful.     ROS: Review of Systems  Gastrointestinal: Positive for abdominal pain, diarrhea and heartburn. Negative for blood in stool, melena and vomiting.  All other systems reviewed and are negative.         Past Medical History:  Diagnosis Date  . Abdominal mass 02/2013  . Acute respiratory failure with hypoxia ([place]) 09/12/2014  . CAD (coronary artery disease)   . CKD (chronic kidney disease), stage III   . Hemorrhagic shock ([place]) 09/13/2014  . Hyperlipidemia    TAKES zOCOR DAILY  . Hypertension    TAKES LOTREL,HCTZ,AND METOPROLOL DAILY  . Intraperitoneal hemorrhage 09/17/2014  . NSTEMI (non-ST elevated myocardial  infarction) ([place]) 02/2013   "LIGHT: ONE MD SAID HE DID AND ONE SAID HE DIDN'T  . Pneumonia    MAR 2014  . Shortness of breath   . Stromal tumor of digestive system ([place])   . UTI (urinary tract infection)     Past Surgical History:  Procedure Laterality Date  . CARDIAC CATHETERIZATION  03-05-13  . COLONOSCOPY  09/08/2014   dr scooler  . CYST EXCISION  1962   wrist  . IR GENERIC HISTORICAL  05/17/2016   IR RADIOLOGIST EVAL & MGMT 05/17/2016 [name], MD [facility] INTERV RAD  . IR GENERIC HISTORICAL  07/26/2016   IR RADIOLOGIST EVAL & MGMT 07/26/2016 [name], MD [facility] INTERV RAD  . LAPAROTOMY N/A 06/04/2013   Procedure: EXPLORATORY LAPAROTOMY, OPEN RESECTION OF MESENTERIC AND INTESTINAL MASS;  Surgeon: [name], MD;  Location: [facility];  Service: General;  Laterality: N/A;  Small Bowel Resection  . LEFT HEART CATHETERIZATION WITH CORONARY ANGIOGRAM N/A 03/05/2013   Procedure: LEFT HEART CATHETERIZATION WITH CORONARY ANGIOGRAM;  Surgeon: [name], MD;  Location: [facility] CATH LAB;  Service: Cardiovascular;  Laterality: N/A;    Social History: Patient lives with his wife.  The patient walks unassisted.  He is not a smoker.    No Known Allergies  Family history: family history includes Skin cancer in his brother and father; Stroke in his mother.  Prior to Admission medications   Medication Sig Start Date End Date Taking?  Authorizing Provider  amLODipine (NORVASC) 5 MG tablet Take 5 mg by mouth daily. 03/17/15  Yes Historical Provider, MD  furosemide (LASIX) 20 MG tablet Take 1 tablet (20 mg total) by mouth daily. 05/20/15  Yes Hosie Poisson, MD  GLEEVEC 400 MG tablet TAKE 1 TABLET BY MOUTH DAILY. TAKE WITH MEALS AND LARGE GLASS OF WATER. CAUTION:CHEMOTHERAPY. 03/14/17  Yes Ladell Pier, MD  metoprolol tartrate (LOPRESSOR) 25 MG tablet Take 1 tablet (25 mg total) by mouth 2 (two) times daily. 03/06/13  Yes Costin Karlyne Greenspan, MD  simvastatin (ZOCOR) 20 MG tablet Take 20 mg by mouth  every evening.   Yes Historical Provider, MD       Physical Exam: BP (!) 136/56   Pulse 88   Temp 98.8 F (37.1 C) (Oral)   Resp (!) 21   SpO2 96%  General appearance: Thin elderly adult male, alert and in no acute distress.   Eyes: Anicteric, conjunctiva pink, lids and lashes normal, watery discharge. PERRL.    ENT: No nasal deformity, epistaxis.NG in place.   Hearing normal. OP moist without lesions.   Neck: No neck masses.  Trachea midline.  No thyromegaly/tenderness. Lymph: No cervical or supraclavicular lymphadenopathy. Skin: Warm and dry.  No jaundice.  No suspicious rashes or lesions. Cardiac: RRR, nl S1-S2, no murmurs appreciated.  Capillary refill is brisk.  No LE edema.  Radial and DP pulses 2+ and symmetric. Respiratory: Normal respiratory rate and rhythm.  CTAB without rales or wheezes. Abdomen: Abdomen soft.  Mild right sided TTP, no guarding or rebound.  Small right inguinal hernia, no red or indurated. No ascites, distension, hepatosplenomegaly.   MSK: No deformities or effusions.  No cyanosis or clubbing. Neuro: Cranial nerves normal.  Sensation intact to light touch. Speech is fluent.  Muscle strength normal.    Psych: Sensorium intact and responding to questions, attention normal.  Behavior appropriate.  Affect normal.  Judgment and insight appear normal.     Labs on Admission:  I have personally reviewed following labs and imaging studies: CBC:  Recent Labs Lab 04/03/17 0112 04/03/17 0200  WBC 9.4  --   NEUTROABS 8.5*  --   HGB 11.3* 11.6*  HCT 34.2* 34.0*  MCV 105.6*  --   PLT 211  --    Basic Metabolic Panel:  Recent Labs Lab 04/03/17 0200  NA 141  K 4.2  CL 107  GLUCOSE 165*  BUN 21*  CREATININE 1.40*   GFR: CrCl cannot be calculated (Unknown ideal weight.).  Liver Function Tests:  Recent Labs Lab 04/03/17 0112  AST 24  ALT 17  ALKPHOS 55  BILITOT 0.8  PROT 6.6  ALBUMIN 3.8   No results for input(s): LIPASE, AMYLASE in the  last 168 hours. No results for input(s): AMMONIA in the last 168 hours. Coagulation Profile: No results for input(s): INR, PROTIME in the last 168 hours. Cardiac Enzymes: No results for input(s): CKTOTAL, CKMB, CKMBINDEX, TROPONINI in the last 168 hours. BNP (last 3 results) No results for input(s): PROBNP in the last 8760 hours. HbA1C: No results for input(s): HGBA1C in the last 72 hours. CBG: No results for input(s): GLUCAP in the last 168 hours. Lipid Profile: No results for input(s): CHOL, HDL, LDLCALC, TRIG, CHOLHDL, LDLDIRECT in the last 72 hours. Thyroid Function Tests: No results for input(s): TSH, T4TOTAL, FREET4, T3FREE, THYROIDAB in the last 72 hours. Anemia Panel: No results for input(s): VITAMINB12, FOLATE, FERRITIN, TIBC, IRON, RETICCTPCT in the last 72 hours. Sepsis  Labs: Invalid input(s): PROCALCITONIN, LACTICIDVEN No results found for this or any previous visit (from the past 240 hour(s)).       Radiological Exams on Admission: Personally reviewed CT report: Ct Renal Stone Study  Result Date: 04/03/2017 CLINICAL DATA:  Epigastric abdominal pain and diarrhea EXAM: CT ABDOMEN AND PELVIS WITHOUT CONTRAST TECHNIQUE: Multidetector CT imaging of the abdomen and pelvis was performed following the standard protocol without IV contrast. COMPARISON:  09/20/2016, 07/15/2016 FINDINGS: Lower chest: Lung bases demonstrate no acute consolidation or pleural effusion. Borderline heart size. Small esophageal hiatal hernia. Hepatobiliary: Calcified gallstone. Contracted gallbladder. No focal hepatic abnormality. No biliary dilatation. Pancreas: Unremarkable. No pancreatic ductal dilatation or surrounding inflammatory changes. Spleen: Normal in size without focal abnormality. Adrenals/Urinary Tract: Adrenal glands are within normal limits. Kidneys show no hydronephrosis. Bladder unremarkable. Stomach/Bowel: The stomach is slightly enlarged. Multiple fluid-filled loops of slightly dilated  small bowel measuring up to 3.2 cm. Bowel anastomosis in the right lower quadrant with slightly enlarged segment of bowel at the suture line. Some dilated bowel leading up to the bowel containing right inguinal hernia. Diverticular disease of the colon. No colon wall thickening. Vascular/Lymphatic: Atherosclerotic vascular calcifications. No grossly enlarged lymph nodes. Reproductive: Prostate is unremarkable. Other: No free air. Small amount of ascites in the right upper quadrant adjacent to the liver Musculoskeletal: Degenerative changes. No acute or suspicious bone lesion. IMPRESSION: 1. Multiple loops of fluid-filled slightly enlarged small bowel suggesting early or partial bowel obstruction ; this may be related to the right inguinal hernia as there appears to be some dilated small bowel upstream to the hernia sac. 2. Small amount of ascites adjacent to the liver 3. Negative for kidney stones or hydronephrosis 4. Gallstones Electronically Signed   By: Donavan Foil M.D.   On: 04/03/2017 03:51    EKG: Independently reviewed. Rate 86, QTc 469.  No ST changes.  No changes from previous.    Assessment/Plan  1. Partial small bowel obstruction:  Alternative considerations include viral gastroenteritis.   -Consult to Gen. surgery regarding hernia -Nothing by mouth -MIVF -Nausea and pain control as needed   2. Hypertension:  -Continue metoprolol, switched IV -Hold amlodipine and statin  3. Chronic diastolic CHF:  Euvolemic -Follow I's and O's -Hold furosemide while NG in place -Daily weights  4. GIST:  -Hold Gleevec well NG in place  5. CKD 3:  Baseline 1.3, stable.  6. Anemia:  At baseline, stable.      DVT prophylaxis: Lovenox  Code Status: Full  Family Communication: Wife and son at bedside  Disposition Plan: Anticipate conservative management partial small bowel obstruction Consults called: Gen. surgery Admission status: OBS At the point of initial evaluation, it is my  clinical opinion that admission for OBSERVATION is reasonable and necessary because the patient's presenting complaints in the context of their chronic conditions represent sufficient risk of deterioration or significant morbidity to constitute reasonable grounds for close observation in the hospital setting, but that the patient may be medically stable for discharge from the hospital within 24 to 48 hours.    Medical decision making: Patient seen at 5:45 AM on 04/03/2017.  The patient was discussed with Dr. Nicholes Stairs.  What exists of the patient's chart was reviewed in depth and summarized above.  Clinical condition: stable.        Edwin Dada [facility] Pager [phone]

## 2017-04-03 NOTE — Progress Notes (Signed)
Patient admitted after midnight, please see H&P.  NG tube coiled in esophagus.  No output in tube or canister.  Will remove.  + BS heard.  Appreciate surgical consultation.  NPO for now.  Labs ordered for the AM.  Eulogio Bear DO

## 2017-04-03 NOTE — ED Triage Notes (Signed)
Pt states he had epigastric pain that started last night around 9 pm. This morning he continued to have the pain and became short of breath.

## 2017-04-03 NOTE — ED Notes (Signed)
NG tube coiled in esophagus.  Remove tube per attending.

## 2017-04-03 NOTE — Consult Note (Signed)
[facility]    Consult Note    Requesting MD: Dr. April Palumbo & Dr. Jackqulyn Livings    PCP: Dr. Maceo Pro    Oncologist: Dr. [name]    Diagnosis: partial small bowel obstruction  Subjective: [name] [name] is a 83 y.o. male with a past medical history significant for GIST tumor (resection in 2014, Dr. Leane Para) on oral chemotherapy (Gleevec), CKD III baseline Cr 1.3, HTN, and HFpEF who presents with diarrhea. The patient was in his usual state of health until after lunch yesterday when he developed several episodes of watery loose stools. Afterwards he started to develop bloating and abdominal pain, which progressed to heartburn-like indigestion until he came to the ER.  Imaging in the ER consistent with pSBO with possible transition point in right groin near known/long standing right inguinal hernia. Does not have any abdominal pain at right groin region. Stable ER course with negative troponin I studies and stable VS. NGT place in ED. Patient reports significant reduction in abdominal discomfort following NGT placement/decompression. Has been evaluated by [facility] and will be admitted to [facility].  Objective: Vital signs in last 24 hours: Temp:  [98.8 F (37.1 C)] 98.8 F (37.1 C) (04/16 0110) Pulse Rate:  [78-99] 88 (04/16 0700) Resp:  [15-23] 20 (04/16 0700) BP: (125-153)/(50-73) 141/52 (04/16 0700) SpO2:  [93 %-100 %] 93 % (04/16 0700)    Intake/Output from previous day: No intake/output data recorded. Intake/Output this shift: No intake/output data recorded.  PE: General: pleasant, WD, WN white male who is laying in bed in NAD HEENT: head is normocephalic, atraumatic. Sclera are not injected. Mouth is pink and dry. Heart: regular, rate, and rhythm with frequent ectopic beats. Normal s1,s2. No obvious murmurs. Lungs: Clear with exception of occasional slight end expiratory wheeze. Respiratory effort nonlabored Abd: soft, distended with frequent high pitched  bowel sounds. NO focal areas of tenderness including right groin region. Small, soft right inguinal hernia. Well healed midline abdominal scar without clinical evidence of ventral hernia. No signs of peritonitis.  MS: all 4 extremities are symmetrical with no cyanosis, clubbing, or edema. Skin: warm and dry with no masses, lesions, or rashes Psych: A&Ox3 with an appropriate affect.  Lab Results:   Recent Labs  04/03/17 0112 04/03/17 0200  WBC 9.4  --   HGB 11.3* 11.6*  HCT 34.2* 34.0*  PLT 211  --    BMET  Recent Labs  04/03/17 0200  NA 141  K 4.2  CL 107  GLUCOSE 165*  BUN 21*  CREATININE 1.40*   PT/INR No results for input(s): LABPROT, INR in the last 72 hours. CMP     Component Value Date/Time   NA 141 04/03/2017 0200   NA 143 03/13/2017 1202   K 4.2 04/03/2017 0200   K 4.4 03/13/2017 1202   CL 107 04/03/2017 0200   CO2 27 03/13/2017 1202   GLUCOSE 165 (H) 04/03/2017 0200   GLUCOSE 102 03/13/2017 1202   BUN 21 (H) 04/03/2017 0200   BUN 17.1 03/13/2017 1202   CREATININE 1.40 (H) 04/03/2017 0200   CREATININE 1.3 03/13/2017 1202   CALCIUM 9.0 03/13/2017 1202   PROT 6.6 04/03/2017 0112   PROT 6.9 03/13/2017 1202   ALBUMIN 3.8 04/03/2017 0112   ALBUMIN 3.9 03/13/2017 1202   AST 24 04/03/2017 0112   AST 21 03/13/2017 1202   ALT 17 04/03/2017 0112   ALT 16 03/13/2017 1202   ALKPHOS 55 04/03/2017 0112   ALKPHOS 66  03/13/2017 1202   BILITOT 0.8 04/03/2017 0112   BILITOT 0.39 03/13/2017 1202   GFRNONAA 54 (L) 07/28/2015 0555   GFRAA >60 07/28/2015 0555   Lipase     Component Value Date/Time   LIPASE 38 07/26/2015 1220       Studies/Results: Dg Abdomen 1 View  Result Date: 04/03/2017 CLINICAL DATA:  NG tube placement EXAM: ABDOMEN - 1 VIEW COMPARISON:  CT abdomen and pelvis 04/03/2017 FINDINGS: The enteric tube is not visualized, suggesting location in the upper esophagus. Gas-filled dilated small bowel suggests small bowel obstruction. Surgical clips  are present. Degenerative changes in the spine. IMPRESSION: Enteric tube is not visualized suggesting location in the upper esophagus off the field of view. Gas distended small bowel consistent with obstruction. Electronically Signed   By: Lucienne Capers M.D.   On: 04/03/2017 06:32   Ct Renal Stone Study  Result Date: 04/03/2017 CLINICAL DATA:  Epigastric abdominal pain and diarrhea EXAM: CT ABDOMEN AND PELVIS WITHOUT CONTRAST TECHNIQUE: Multidetector CT imaging of the abdomen and pelvis was performed following the standard protocol without IV contrast. COMPARISON:  09/20/2016, 07/15/2016 FINDINGS: Lower chest: Lung bases demonstrate no acute consolidation or pleural effusion. Borderline heart size. Small esophageal hiatal hernia. Hepatobiliary: Calcified gallstone. Contracted gallbladder. No focal hepatic abnormality. No biliary dilatation. Pancreas: Unremarkable. No pancreatic ductal dilatation or surrounding inflammatory changes. Spleen: Normal in size without focal abnormality. Adrenals/Urinary Tract: Adrenal glands are within normal limits. Kidneys show no hydronephrosis. Bladder unremarkable. Stomach/Bowel: The stomach is slightly enlarged. Multiple fluid-filled loops of slightly dilated small bowel measuring up to 3.2 cm. Bowel anastomosis in the right lower quadrant with slightly enlarged segment of bowel at the suture line. Some dilated bowel leading up to the bowel containing right inguinal hernia. Diverticular disease of the colon. No colon wall thickening. Vascular/Lymphatic: Atherosclerotic vascular calcifications. No grossly enlarged lymph nodes. Reproductive: Prostate is unremarkable. Other: No free air. Small amount of ascites in the right upper quadrant adjacent to the liver Musculoskeletal: Degenerative changes. No acute or suspicious bone lesion. IMPRESSION: 1. Multiple loops of fluid-filled slightly enlarged small bowel suggesting early or partial bowel obstruction ; this may be related to  the right inguinal hernia as there appears to be some dilated small bowel upstream to the hernia sac. 2. Small amount of ascites adjacent to the liver 3. Negative for kidney stones or hydronephrosis 4. Gallstones Electronically Signed   By: Donavan Foil M.D.   On: 04/03/2017 03:51    Anti-infectives: Anti-infectives    None       Assessment/Plan HD#0. 83 y/o male being admitted for pSBO. No clinical or radiographic indication of right inguinal hernia incarceration. Continue NGT decompression and bowel rest with IV hydration in hopes that bowel function will normalize with conservative management. Thank you for this consultation. Will continue to follow.    LOS: 0 days    LEE Edrees Valent, [facility] 04/03/2017, 7:42 AM

## 2017-04-04 ENCOUNTER — Encounter (HOSPITAL_COMMUNITY): Payer: Self-pay | Admitting: General Practice

## 2017-04-04 LAB — CBC
HEMATOCRIT: 30.5 % — AB (ref 39.0–52.0)
HEMOGLOBIN: 9.9 g/dL — AB (ref 13.0–17.0)
MCH: 34.4 pg — AB (ref 26.0–34.0)
MCHC: 32.5 g/dL (ref 30.0–36.0)
MCV: 105.9 fL — ABNORMAL HIGH (ref 78.0–100.0)
Platelets: 196 10*3/uL (ref 150–400)
RBC: 2.88 MIL/uL — AB (ref 4.22–5.81)
RDW: 13.7 % (ref 11.5–15.5)
WBC: 5.6 10*3/uL (ref 4.0–10.5)

## 2017-04-04 LAB — BASIC METABOLIC PANEL
ANION GAP: 7 (ref 5–15)
BUN: 26 mg/dL — ABNORMAL HIGH (ref 6–20)
CALCIUM: 8.6 mg/dL — AB (ref 8.9–10.3)
CO2: 24 mmol/L (ref 22–32)
Chloride: 111 mmol/L (ref 101–111)
Creatinine, Ser: 1.19 mg/dL (ref 0.61–1.24)
GFR, EST NON AFRICAN AMERICAN: 54 mL/min — AB (ref >60)
GLUCOSE: 121 mg/dL — AB (ref 65–99)
POTASSIUM: 3.9 mmol/L (ref 3.5–5.1)
Sodium: 142 mmol/L (ref 135–145)

## 2017-04-04 MED ORDER — IMATINIB MESYLATE 400 MG PO TABS: 400.0000 mg | ORAL_TABLET | Freq: Every day | ORAL | Status: DC

## 2017-04-04 MED ORDER — IMATINIB MESYLATE 100 MG PO TABS
400.0000 mg | ORAL_TABLET | Freq: Every day | ORAL | Status: DC
  Administered 2017-04-05 – 2017-04-06 (×2): 400 mg via ORAL
  Filled 2017-04-04 (×2): qty 4

## 2017-04-04 MED ORDER — FUROSEMIDE 20 MG PO TABS
20.0000 mg | ORAL_TABLET | Freq: Every day | ORAL | Status: DC
  Administered 2017-04-05 – 2017-04-06 (×2): 20 mg via ORAL
  Filled 2017-04-04 (×2): qty 1

## 2017-04-04 MED ORDER — METOPROLOL TARTRATE 25 MG PO TABS
25.0000 mg | ORAL_TABLET | Freq: Two times a day (BID) | ORAL | Status: DC
  Administered 2017-04-04 – 2017-04-06 (×3): 25 mg via ORAL
  Filled 2017-04-04 (×4): qty 1

## 2017-04-04 NOTE — Consult Note (Signed)
[facility] CM Primary Care Navigator  04/04/2017  [place] Code [birth year] [id number]   Met with patient and wife Mardene Speak the bedside to identify possible discharge needs. Patient reports having increased abdominal pain radiating to chest area and nausea that had led to this admission. Patient endorses [place], [place] of Dr Briscoe Deutscher (retired) with [facility] as the primary care provider.   Patient shared using [place] at [facility] obtain medications without any problem.  Patient states that wife manages his medications at home straight out of the containers.   He reports being able to drive prior to admission but wife will  provide transportation to his doctors'appointments after discharge.  Patient's wife will be the primary caregiver at home as stated.  Anticipated discharge plan is home with home health services.  Patient and wife voiced understanding to call primary care provider's office when he gets home, for a post discharge follow-up appointment within a week or sooner if needs arise.Patient letter (with PCP's contact number) was provided as theirreminder.  Patient and wife denied anyhealth management needs or concerns at this time.   For questions, please contact:  Dannielle Huh, BSN, RN- [facility] Primary Care Navigator  Telephone: [phone] [place]

## 2017-04-04 NOTE — Evaluation (Signed)
Physical Therapy Evaluation and Discharge Patient Details Name: [name] [name] MRN: [id number] DOB: 05/25/1929 Today's Date: 04/04/2017   History of Present Illness  Adm 04/03/17 with abd pain and diarrhea. + Partial small bowel obstruction PMHx- HTN, CKD, CHF,   Clinical Impression  Patient evaluated by Physical Therapy with no further acute PT needs identified. All education has been completed and the patient has no further questions.  See below for any follow-up Physical Therapy or equipment needs. PT is signing off. Thank you for this referral.     Follow Up Recommendations No PT follow up    Equipment Recommendations  None recommended by PT    Recommendations for Other Services       Precautions / Restrictions Precautions Precautions: None Restrictions Weight Bearing Restrictions: No      Mobility  Bed Mobility Overal bed mobility: Independent                Transfers Overall transfer level: Independent Equipment used: None                Ambulation/Gait Ambulation/Gait assistance: Independent Ambulation Distance (Feet): 300 Feet Assistive device: None Gait Pattern/deviations: WFL(Within Functional Limits)        Stairs            Wheelchair Mobility    Modified Rankin (Stroke Patients Only)       Balance Overall balance assessment: Independent                                           Pertinent Vitals/Pain Pain Assessment: No/denies pain    Home Living Family/patient expects to be discharged to:: Private residence Living Arrangements: Spouse/significant other Available Help at Discharge: Family;Available 24 hours/day Type of Home: House Home Access: Stairs to enter   [employer] of Steps: 2 Home Layout: One level        Prior Function Level of Independence: Independent         Comments: [place]; lifts weights/exercises every morning     Hand Dominance         Extremity/Trunk Assessment   Upper Extremity Assessment Upper Extremity Assessment: Overall WFL for tasks assessed    Lower Extremity Assessment Lower Extremity Assessment: Overall WFL for tasks assessed    Cervical / Trunk Assessment Cervical / Trunk Assessment: Normal  Communication   Communication: HOH  Cognition Arousal/Alertness: Awake/alert Behavior During Therapy: WFL for tasks assessed/performed Overall Cognitive Status: Within Functional Limits for tasks assessed                                        General Comments General comments (skin integrity, edema, etc.): Wife present; no concerns re: mobility    Exercises     Assessment/Plan    PT Assessment [place] does not need any further PT services  PT Problem List         PT Treatment Interventions      PT Goals (Current goals can be found in the Care Plan section)  Acute Rehab PT Goals Patient Stated Goal: go home once keeping food down PT Goal Formulation: All assessment and education complete, DC therapy    Frequency     Barriers to discharge        Co-evaluation  End of Session   Activity Tolerance: Patient tolerated treatment well Patient left: in chair;with call bell/phone within reach;with family/visitor present Nurse Communication: Mobility status;Other (comment) (no PT needs) PT Visit Diagnosis: Difficulty in walking, not elsewhere classified (R26.2)    Time: 1105-1120 PT Time Calculation (min) (ACUTE ONLY): 15 min   Charges:   PT Evaluation $PT Eval Low Complexity: 1 Procedure     PT G Codes:   PT G-Codes **NOT FOR INPATIENT CLASS** Functional Assessment Tool Used: AM-PAC 6 Clicks Basic Mobility Functional Limitation: Mobility: Walking and moving around Mobility: Walking and Moving Around Current Status (F8182): 0 percent impaired, limited or restricted Mobility: Walking and Moving Around Goal Status (X9371): 0 percent impaired, limited or  restricted Mobility: Walking and Moving Around Discharge Status (I9678): 0 percent impaired, limited or restricted      Barry Brunner, PT     [employer] 04/04/2017, 11:28 AM

## 2017-04-04 NOTE — Care Management Note (Signed)
Case Management Note  Patient Details  Name: [name] [name] MRN: [id number] Date of Birth: 12-20-1928  Subjective/Objective:                    Action/Plan:  PSBO likely due to adhesions Right inguinal hernia easily reduces NGT,IVF Expected Discharge Date:                  Expected Discharge Plan:  Home/Self Care  In-House Referral:     Discharge planning Services     Post Acute Care Choice:    Choice offered to:     DME Arranged:    DME Agency:     HH Arranged:    [place] Agency:     Status of Service:  In process, will continue to follow  If discussed at Long Length of Stay Meetings, dates discussed:    Additional Comments:  Marilu Favre, RN 04/04/2017, 8:32 AM

## 2017-04-04 NOTE — Progress Notes (Signed)
[facility] Progress Note     Subjective: CC: no complaints this morning  Pt had 2 large BM's and is having flatus. No nausea, vomiting, fever or chills. Pt is asking when he can have food.   Objective: Vital signs in last 24 hours: Temp:  [98.4 F (36.9 C)-99.2 F (37.3 C)] 99.2 F (37.3 C) (04/17 0630) Pulse Rate:  [74-94] 74 (04/17 0630) Resp:  [14-22] 16 (04/17 0630) BP: (124-156)/(50-68) 131/61 (04/17 0630) SpO2:  [94 %-99 %] 95 % (04/17 0630) Weight:  [173 lb 4.8 oz (78.6 kg)] 173 lb 4.8 oz (78.6 kg) (04/17 0630) Last BM Date: 04/04/17  Intake/Output from previous day: 04/16 0701 - 04/17 0700 In: 1107.1 [I.V.:1107.1] Out: 300 [Urine:300] Intake/Output this shift: Total I/O In: -  Out: 300 [Urine:300]  PE: Gen:  Alert, NAD, pleasant, cooperative, pale elderly man Card:  RRR, no obvious murmur heard Pulm:  CTA, no W/R/R, effort normal Abd: Soft, not distended, not tender, +BS Skin: no rashes noted, warm and dry  Lab Results:   Recent Labs  04/03/17 0112 04/03/17 0200 04/04/17 0312  WBC 9.4  --  5.6  HGB 11.3* 11.6* 9.9*  HCT 34.2* 34.0* 30.5*  PLT 211  --  196   BMET  Recent Labs  04/03/17 0200 04/04/17 0312  NA 141 142  K 4.2 3.9  CL 107 111  CO2  --  24  GLUCOSE 165* 121*  BUN 21* 26*  CREATININE 1.40* 1.19  CALCIUM  --  8.6*   PT/INR No results for input(s): LABPROT, INR in the last 72 hours. CMP     Component Value Date/Time   NA 142 04/04/2017 0312   NA 143 03/13/2017 1202   K 3.9 04/04/2017 0312   K 4.4 03/13/2017 1202   CL 111 04/04/2017 0312   CO2 24 04/04/2017 0312   CO2 27 03/13/2017 1202   GLUCOSE 121 (H) 04/04/2017 0312   GLUCOSE 102 03/13/2017 1202   BUN 26 (H) 04/04/2017 0312   BUN 17.1 03/13/2017 1202   CREATININE 1.19 04/04/2017 0312   CREATININE 1.3 03/13/2017 1202   CALCIUM 8.6 (L) 04/04/2017 0312   CALCIUM 9.0 03/13/2017 1202   PROT 6.6 04/03/2017 0112   PROT 6.9 03/13/2017 1202   ALBUMIN 3.8  04/03/2017 0112   ALBUMIN 3.9 03/13/2017 1202   AST 24 04/03/2017 0112   AST 21 03/13/2017 1202   ALT 17 04/03/2017 0112   ALT 16 03/13/2017 1202   ALKPHOS 55 04/03/2017 0112   ALKPHOS 66 03/13/2017 1202   BILITOT 0.8 04/03/2017 0112   BILITOT 0.39 03/13/2017 1202   GFRNONAA 54 (L) 04/04/2017 0312   GFRAA >60 04/04/2017 0312   Lipase     Component Value Date/Time   LIPASE 38 07/26/2015 1220       Studies/Results: Dg Abdomen 1 View  Result Date: 04/03/2017 CLINICAL DATA:  NG tube placement EXAM: ABDOMEN - 1 VIEW COMPARISON:  CT abdomen and pelvis 04/03/2017 FINDINGS: The enteric tube is not visualized, suggesting location in the upper esophagus. Gas-filled dilated small bowel suggests small bowel obstruction. Surgical clips are present. Degenerative changes in the spine. IMPRESSION: Enteric tube is not visualized suggesting location in the upper esophagus off the field of view. Gas distended small bowel consistent with obstruction. Electronically Signed   By: Lucienne Capers M.D.   On: 04/03/2017 06:32   Dg Chest Port 1 View  Result Date: 04/03/2017 CLINICAL DATA:  Nasogastric tube placement. EXAM: PORTABLE CHEST 1  VIEW COMPARISON:  Abdomen 04/03/2017.  CT 04/03/2017. FINDINGS: NG tube noted coiled in the mid esophagus. Repositioning should be considered. Heart size normal. Mild left base subsegmental atelectasis. No pleural effusion or pneumothorax. No acute bony abnormality. IMPRESSION: NG noted coiled in the esophagus. Repositioning should be considered. Critical Value/emergent results were called by telephone at the time of interpretation on 04/03/2017 at 9:08 am to Dr. Tyrone Nine, who verbally acknowledged these results. Electronically Signed   By: Marcello Moores  Register   On: 04/03/2017 09:10   Ct Renal Stone Study  Result Date: 04/03/2017 CLINICAL DATA:  Epigastric abdominal pain and diarrhea EXAM: CT ABDOMEN AND PELVIS WITHOUT CONTRAST TECHNIQUE: Multidetector CT imaging of the abdomen  and pelvis was performed following the standard protocol without IV contrast. COMPARISON:  09/20/2016, 07/15/2016 FINDINGS: Lower chest: Lung bases demonstrate no acute consolidation or pleural effusion. Borderline heart size. Small esophageal hiatal hernia. Hepatobiliary: Calcified gallstone. Contracted gallbladder. No focal hepatic abnormality. No biliary dilatation. Pancreas: Unremarkable. No pancreatic ductal dilatation or surrounding inflammatory changes. Spleen: Normal in size without focal abnormality. Adrenals/Urinary Tract: Adrenal glands are within normal limits. Kidneys show no hydronephrosis. Bladder unremarkable. Stomach/Bowel: The stomach is slightly enlarged. Multiple fluid-filled loops of slightly dilated small bowel measuring up to 3.2 cm. Bowel anastomosis in the right lower quadrant with slightly enlarged segment of bowel at the suture line. Some dilated bowel leading up to the bowel containing right inguinal hernia. Diverticular disease of the colon. No colon wall thickening. Vascular/Lymphatic: Atherosclerotic vascular calcifications. No grossly enlarged lymph nodes. Reproductive: Prostate is unremarkable. Other: No free air. Small amount of ascites in the right upper quadrant adjacent to the liver Musculoskeletal: Degenerative changes. No acute or suspicious bone lesion. IMPRESSION: 1. Multiple loops of fluid-filled slightly enlarged small bowel suggesting early or partial bowel obstruction ; this may be related to the right inguinal hernia as there appears to be some dilated small bowel upstream to the hernia sac. 2. Small amount of ascites adjacent to the liver 3. Negative for kidney stones or hydronephrosis 4. Gallstones Electronically Signed   By: Donavan Foil M.D.   On: 04/03/2017 03:51    Anti-infectives: Anti-infectives    None       Assessment/Plan  SBO likely 2/2 adhesions - pt is having bowel function - clears and advance diet as tolerated  We will continue to follow  this pt.    LOS: 1 day    Kalman Drape , [facility] 04/04/2017, 8:07 AM Pager: [phone] Consults: [phone] Mon-Fri 7:00 am-4:30 pm Sat-Sun 7:00 am-11:30 am

## 2017-04-04 NOTE — Progress Notes (Signed)
PROGRESS NOTE    [name] [name]  WEX:[id number] DOB: [birth year] DOA: 04/03/2017 PCP: Abigail Miyamoto, MD   Outpatient Specialists:     Brief Narrative:  [name] [name] is a 83 y.o. 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    Assessment & Plan:   Principal Problem:   Partial small bowel obstruction (HCC) Active Problems:   Chronic anemia   GIST (gastrointestinal stromal tumor) of small bowel, malignant (HCC)   Hypertension   Diastolic CHF, acute on chronic (HCC)   CKD (chronic kidney disease), stage III   Partial small bowel obstruction:  -clear- advance to full for dinner if tolerates -+flatus and BMs  Hypertension:  -resume PO meds once consistently taking PO  Chronic diastolic CHF:  -resume lasix in AM -Daily weights   GIST:  -resume Gleevec  CKD 3:  Baseline 1.3, stable.  Anemia:  At baseline, stable.    DVT prophylaxis:  /Lovenox   Code Status: Full Code   Family Communication:   Disposition Plan:     Consultants:   General surgery   Subjective: 2 BMs yesterday  Objective: Vitals:   04/03/17 1400 04/03/17 1441 04/03/17 2139 04/04/17 0630  BP: 124/65 (!) 156/58 (!) 125/50 131/61  Pulse: 94 94 90 74  Resp: 19  18 16   Temp:  98.9 F (37.2 C) 98.4 F (36.9 C) 99.2 F (37.3 C)  TempSrc:  Oral Oral Oral  SpO2: 95% 94% 95% 95%  Weight:    78.6 kg (173 lb 4.8 oz)    Intake/Output Summary (Last 24 hours) at 04/04/17 1139 Last data filed at 04/04/17 0721  Gross per 24 hour  Intake          1107.08 ml  Output              600 ml  Net           507.08 ml   Filed Weights   04/04/17 0630  Weight: 78.6 kg (173 lb 4.8 oz)    Examination:  General exam: Appears calm and comfortable  Respiratory system: Clear to auscultation. Respiratory effort normal. Cardiovascular system: S1 & S2 heard, RRR. No JVD, murmurs, rubs, gallops or clicks. No  pedal edema. Gastrointestinal system: Abdomen is nondistended, soft and nontender. No organomegaly or masses felt. Normal bowel sounds heard. Central nervous system: Alert and oriented. No focal neurological deficits. Extremities: Symmetric 5 x 5 power. Skin: No rashes, lesions or ulcers Psychiatry: Judgement and insight appear normal. Mood & affect appropriate.     Data Reviewed: I have personally reviewed following labs and imaging studies  CBC:  Recent Labs Lab 04/03/17 0112 04/03/17 0200 04/04/17 0312  WBC 9.4  --  5.6  NEUTROABS 8.5*  --   --   HGB 11.3* 11.6* 9.9*  HCT 34.2* 34.0* 30.5*  MCV 105.6*  --  105.9*  PLT 211  --  938   Basic Metabolic Panel:  Recent Labs Lab 04/03/17 0200 04/04/17 0312  NA 141 142  K 4.2 3.9  CL 107 111  CO2  --  24  GLUCOSE 165* 121*  BUN 21* 26*  CREATININE 1.40* 1.19  CALCIUM  --  8.6*   GFR: Estimated Creatinine Clearance: 42.4 mL/min (by C-G formula based on SCr of 1.19 mg/dL). Liver Function Tests:  Recent Labs Lab 04/03/17 0112  AST 24  ALT 17  ALKPHOS 55  BILITOT 0.8  PROT 6.6  ALBUMIN 3.8   No results for input(s): LIPASE, AMYLASE in the last 168 hours. No results for input(s): AMMONIA in the last 168 hours. Coagulation Profile: No results for input(s): INR, PROTIME in the last 168 hours. Cardiac Enzymes:  Recent Labs Lab 04/03/17 0909  TROPONINI 0.03*   BNP (last 3 results) No results for input(s): PROBNP in the last 8760 hours. HbA1C: No results for input(s): HGBA1C in the last 72 hours. CBG: No results for input(s): GLUCAP in the last 168 hours. Lipid Profile: No results for input(s): CHOL, HDL, LDLCALC, TRIG, CHOLHDL, LDLDIRECT in the last 72 hours. Thyroid Function Tests: No results for input(s): TSH, T4TOTAL, FREET4, T3FREE, THYROIDAB in the last 72 hours. Anemia Panel: No results for input(s): VITAMINB12, FOLATE, FERRITIN, TIBC, IRON, RETICCTPCT in the last 72 hours. Urine analysis:      Component Value Date/Time   COLORURINE YELLOW 04/03/2017 0308   APPEARANCEUR CLEAR 04/03/2017 0308   LABSPEC 1.021 04/03/2017 0308   PHURINE 5.0 04/03/2017 0308   GLUCOSEU 150 (A) 04/03/2017 0308   HGBUR SMALL (A) 04/03/2017 0308   BILIRUBINUR NEGATIVE 04/03/2017 0308   KETONESUR 20 (A) 04/03/2017 0308   PROTEINUR 30 (A) 04/03/2017 0308   UROBILINOGEN 0.2 05/06/2015 0140   NITRITE NEGATIVE 04/03/2017 0308   LEUKOCYTESUR NEGATIVE 04/03/2017 0308    )No results found for this or any previous visit (from the past 240 hour(s)).    Anti-infectives    None       Radiology Studies: Dg Abdomen 1 View  Result Date: 04/03/2017 CLINICAL DATA:  NG tube placement EXAM: ABDOMEN - 1 VIEW COMPARISON:  CT abdomen and pelvis 04/03/2017 FINDINGS: The enteric tube is not visualized, suggesting location in the upper esophagus. Gas-filled dilated small bowel suggests small bowel obstruction. Surgical clips are present. Degenerative changes in the spine. IMPRESSION: Enteric tube is not visualized suggesting location in the upper esophagus off the field of view. Gas distended small bowel consistent with obstruction. Electronically Signed   By: Lucienne Capers M.D.   On: 04/03/2017 06:32   Dg Chest Port 1 View  Result Date: 04/03/2017 CLINICAL DATA:  Nasogastric tube placement. EXAM: PORTABLE CHEST 1 VIEW COMPARISON:  Abdomen 04/03/2017.  CT 04/03/2017. FINDINGS: NG tube noted coiled in the mid esophagus. Repositioning should be considered. Heart size normal. Mild left base subsegmental atelectasis. No pleural effusion or pneumothorax. No acute bony abnormality. IMPRESSION: NG noted coiled in the esophagus. Repositioning should be considered. Critical Value/emergent results were called by telephone at the time of interpretation on 04/03/2017 at 9:08 am to Dr. Tyrone Nine, who verbally acknowledged these results. Electronically Signed   By: Marcello Moores  Register   On: 04/03/2017 09:10   Ct Renal Stone Study  Result  Date: 04/03/2017 CLINICAL DATA:  Epigastric abdominal pain and diarrhea EXAM: CT ABDOMEN AND PELVIS WITHOUT CONTRAST TECHNIQUE: Multidetector CT imaging of the abdomen and pelvis was performed following the standard protocol without IV contrast. COMPARISON:  09/20/2016, 07/15/2016 FINDINGS: Lower chest: Lung bases demonstrate no acute consolidation or pleural effusion. Borderline heart size. Small esophageal hiatal hernia. Hepatobiliary: Calcified gallstone. Contracted gallbladder. No focal hepatic abnormality. No biliary dilatation. Pancreas: Unremarkable. No pancreatic ductal dilatation or surrounding inflammatory changes. Spleen: Normal in size without focal abnormality. Adrenals/Urinary Tract: Adrenal glands are within normal limits. Kidneys show no hydronephrosis. Bladder unremarkable. Stomach/Bowel: The stomach is slightly enlarged. Multiple fluid-filled loops of slightly dilated small bowel measuring up to 3.2 cm. Bowel anastomosis in the right lower quadrant with slightly  enlarged segment of bowel at the suture line. Some dilated bowel leading up to the bowel containing right inguinal hernia. Diverticular disease of the colon. No colon wall thickening. Vascular/Lymphatic: Atherosclerotic vascular calcifications. No grossly enlarged lymph nodes. Reproductive: Prostate is unremarkable. Other: No free air. Small amount of ascites in the right upper quadrant adjacent to the liver Musculoskeletal: Degenerative changes. No acute or suspicious bone lesion. IMPRESSION: 1. Multiple loops of fluid-filled slightly enlarged small bowel suggesting early or partial bowel obstruction ; this may be related to the right inguinal hernia as there appears to be some dilated small bowel upstream to the hernia sac. 2. Small amount of ascites adjacent to the liver 3. Negative for kidney stones or hydronephrosis 4. Gallstones Electronically Signed   By: Donavan Foil M.D.   On: 04/03/2017 03:51        Scheduled Meds: .  enoxaparin (LOVENOX) injection  40 mg Subcutaneous Q24H  . imatinib  400 mg Oral Q breakfast  . mouth rinse  15 mL Mouth Rinse BID  . metoprolol  5 mg Intravenous Q12H  . olopatadine  1 drop Both Eyes BID   Continuous Infusions: . sodium chloride 75 mL/hr at 04/04/17 0700     LOS: 1 day    Time spent: 25 min    [place], DO [facility] Pager [phone]  If 7PM-7AM, please contact night-coverage [email or web address] Password [facility] 04/04/2017, 11:39 AM

## 2017-04-04 NOTE — Progress Notes (Signed)
Pharmacy called and said we actually have the [place] in the dose the patient takes at home so they returned the home med and I gave it to the patient's wife and let them know we will use our supply.

## 2017-04-05 MED ORDER — TRAMADOL HCL 50 MG PO TABS
50.0000 mg | ORAL_TABLET | Freq: Four times a day (QID) | ORAL | Status: DC | PRN
  Administered 2017-04-05: 50 mg via ORAL
  Filled 2017-04-05: qty 1

## 2017-04-05 MED ORDER — CEPHALEXIN 500 MG PO CAPS
500.0000 mg | ORAL_CAPSULE | Freq: Two times a day (BID) | ORAL | Status: DC
  Administered 2017-04-05 – 2017-04-06 (×2): 500 mg via ORAL
  Filled 2017-04-05 (×2): qty 1

## 2017-04-05 MED ORDER — RISAQUAD PO CAPS
1.0000 | ORAL_CAPSULE | Freq: Every day | ORAL | Status: DC
  Administered 2017-04-05 – 2017-04-06 (×2): 1 via ORAL
  Filled 2017-04-05 (×2): qty 1

## 2017-04-05 NOTE — Progress Notes (Signed)
Patient's IV got accidentally pulled out, I notified MD Eliseo Squires and she said we could leave it out. Will continue to watch.

## 2017-04-05 NOTE — Progress Notes (Signed)
Patient due for Gleevec at 0800 but since still on full liquids until lunch, wants to wait until lunch to take it then.

## 2017-04-05 NOTE — Progress Notes (Signed)
[facility] Progress Note    Subjective: Patient doing better.  No acute distress  Objective: Vital signs in last 24 hours: Temp:  [97.7 F (36.5 C)-99.2 F (37.3 C)] 97.7 F (36.5 C) (04/18 0528) Pulse Rate:  [60-78] 60 (04/18 0528) Resp:  [16] 16 (04/18 0528) BP: (122-139)/(54-61) 122/54 (04/18 0528) SpO2:  [95 %-97 %] 97 % (04/18 0528) Weight:  [78.6 kg (173 lb 4.8 oz)-79.6 kg (175 lb 6.4 oz)] 79.6 kg (175 lb 6.4 oz) (04/18 0528) Last BM Date: 04/04/17  Intake/Output from previous day: 04/17 0701 - 04/18 0700 In: -  Out: 300 [Urine:300] Intake/Output this shift: No intake/output data recorded.  General: No distress.  Had two large BMs last night also.  Lungs: clear  Abd: Good bowel sounds.. Not tender at all.  Right inguinal hernia is reducible but  Mildly tender  Extremities: No changes.  No clinical signs or symptoms of DVT  Neuro: Intact  Lab Results:  @LABLAST2 (wbc:2,hgb:2,hct:2,plt:2) BMET ) Recent Labs  04/03/17 0200 04/04/17 0312  NA 141 142  K 4.2 3.9  CL 107 111  CO2  --  24  GLUCOSE 165* 121*  BUN 21* 26*  CREATININE 1.40* 1.19  CALCIUM  --  8.6*   PT/INR No results for input(s): LABPROT, INR in the last 72 hours. ABG No results for input(s): PHART, HCO3 in the last 72 hours.  Invalid input(s): PCO2, PO2  Studies/Results: Dg Abdomen 1 View  Result Date: 04/03/2017 CLINICAL DATA:  NG tube placement EXAM: ABDOMEN - 1 VIEW COMPARISON:  CT abdomen and pelvis 04/03/2017 FINDINGS: The enteric tube is not visualized, suggesting location in the upper esophagus. Gas-filled dilated small bowel suggests small bowel obstruction. Surgical clips are present. Degenerative changes in the spine. IMPRESSION: Enteric tube is not visualized suggesting location in the upper esophagus off the field of view. Gas distended small bowel consistent with obstruction. Electronically Signed   By: Lucienne Capers M.D.   On: 04/03/2017 06:32   Dg Chest Port 1  View  Result Date: 04/03/2017 CLINICAL DATA:  Nasogastric tube placement. EXAM: PORTABLE CHEST 1 VIEW COMPARISON:  Abdomen 04/03/2017.  CT 04/03/2017. FINDINGS: NG tube noted coiled in the mid esophagus. Repositioning should be considered. Heart size normal. Mild left base subsegmental atelectasis. No pleural effusion or pneumothorax. No acute bony abnormality. IMPRESSION: NG noted coiled in the esophagus. Repositioning should be considered. Critical Value/emergent results were called by telephone at the time of interpretation on 04/03/2017 at 9:08 am to Dr. Tyrone Nine, who verbally acknowledged these results. Electronically Signed   By: Marcello Moores  Register   On: 04/03/2017 09:10    Anti-infectives: Anti-infectives    None      Assessment/Plan: s/p  Advance diet May advance to fulls and possible soft diet later today.  LOS: 2 days   Kathryne Eriksson. Dahlia Bailiff, MD, FACS [phone] [id number] [facility] 04/05/2017

## 2017-04-05 NOTE — Progress Notes (Signed)
This morning patient noted a small red area on right lower abdomen that was where an old drain used to be. I had MD Eliseo Squires look at it and she said we would watch it. Later tonight it was worse and patient was requesting a pain pill for it so I notified MD Eliseo Squires. I put a warm cloth on it to help it "pop open" per pt request but nothing has happened yet. Will continue to follow.

## 2017-04-05 NOTE — Progress Notes (Signed)
PROGRESS NOTE    [name]  FKC:[id number] DOB: [birth year] DOA: 04/03/2017 PCP: Abigail Miyamoto, MD   Outpatient Specialists:     Brief Narrative:  [name] [name] is a 83 y.o. 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    Assessment & Plan:   Principal Problem:   Partial small bowel obstruction (HCC) Active Problems:   Chronic anemia   GIST (gastrointestinal stromal tumor) of small bowel, malignant (HCC)   Hypertension   Diastolic CHF, acute on chronic (HCC)   CKD (chronic kidney disease), stage III   Partial small bowel obstruction:  -diet advanced to soft -+flatus and BMs -patient ambulating  Hypertension:  -resume PO meds once consistently taking PO  Chronic diastolic CHF:  -resume lasix -Daily weights   GIST:  -resume Gleevec  CKD 3:  Baseline 1.3, stable.  Anemia:  Suspect recent drop from IVF    DVT prophylaxis:  Lovenox   Code Status: Full Code   Family Communication: Multiple at bedside  Disposition Plan:     Consultants:   General surgery   Subjective: Tolerating full liquids, walked around unit with wife last PM  Objective: Vitals:   04/04/17 1347 04/04/17 2048 04/05/17 0528 04/05/17 1342  BP: (!) 132/54 (!) 139/57 (!) 122/54 (!) 154/55  Pulse: 63 78 60 70  Resp: 16 16 16 18   Temp: 98.6 F (37 C) 99 F (37.2 C) 97.7 F (36.5 C) 98 F (36.7 C)  TempSrc: Oral Oral Oral Oral  SpO2: 97% 97% 97% 100%  Weight:   79.6 kg (175 lb 6.4 oz)     Intake/Output Summary (Last 24 hours) at 04/05/17 1434 Last data filed at 04/04/17 2230  Gross per 24 hour  Intake              240 ml  Output                0 ml  Net              240 ml   Filed Weights   04/04/17 0630 04/05/17 0528  Weight: 78.6 kg (173 lb 4.8 oz) 79.6 kg (175 lb 6.4 oz)    Examination:  General exam: Awake, appears stated age Respiratory system: Clear to  auscultation. Respiratory effort normal. Cardiovascular system: S1 & S2 heard, RRR. No JVD, murmurs, rubs, gallops or clicks. No pedal edema. Gastrointestinal system: mild distention of abd, + BS Central nervous system: Alert and oriented. No focal neurological deficits.    Data Reviewed: I have personally reviewed following labs and imaging studies  CBC:  Recent Labs Lab 04/03/17 0112 04/03/17 0200 04/04/17 0312  WBC 9.4  --  5.6  NEUTROABS 8.5*  --   --   HGB 11.3* 11.6* 9.9*  HCT 34.2* 34.0* 30.5*  MCV 105.6*  --  105.9*  PLT 211  --  749   Basic Metabolic Panel:  Recent Labs Lab 04/03/17 0200 04/04/17 0312  NA 141 142  K 4.2 3.9  CL 107 111  CO2  --  24  GLUCOSE 165* 121*  BUN 21* 26*  CREATININE 1.40* 1.19  CALCIUM  --  8.6*   GFR: Estimated Creatinine Clearance: 45.9 mL/min (by C-G formula based on SCr of 1.19 mg/dL). Liver Function Tests:  Recent Labs Lab 04/03/17 0112  AST 24  ALT 17  ALKPHOS 55  BILITOT 0.8  PROT 6.6  ALBUMIN  3.8   No results for input(s): LIPASE, AMYLASE in the last 168 hours. No results for input(s): AMMONIA in the last 168 hours. Coagulation Profile: No results for input(s): INR, PROTIME in the last 168 hours. Cardiac Enzymes:  Recent Labs Lab 04/03/17 0909  TROPONINI 0.03*   BNP (last 3 results) No results for input(s): PROBNP in the last 8760 hours. HbA1C: No results for input(s): HGBA1C in the last 72 hours. CBG: No results for input(s): GLUCAP in the last 168 hours. Lipid Profile: No results for input(s): CHOL, HDL, LDLCALC, TRIG, CHOLHDL, LDLDIRECT in the last 72 hours. Thyroid Function Tests: No results for input(s): TSH, T4TOTAL, FREET4, T3FREE, THYROIDAB in the last 72 hours. Anemia Panel: No results for input(s): VITAMINB12, FOLATE, FERRITIN, TIBC, IRON, RETICCTPCT in the last 72 hours. Urine analysis:    Component Value Date/Time   COLORURINE YELLOW 04/03/2017 0308   APPEARANCEUR CLEAR 04/03/2017  0308   LABSPEC 1.021 04/03/2017 0308   PHURINE 5.0 04/03/2017 0308   GLUCOSEU 150 (A) 04/03/2017 0308   HGBUR SMALL (A) 04/03/2017 0308   BILIRUBINUR NEGATIVE 04/03/2017 0308   KETONESUR 20 (A) 04/03/2017 0308   PROTEINUR 30 (A) 04/03/2017 0308   UROBILINOGEN 0.2 05/06/2015 0140   NITRITE NEGATIVE 04/03/2017 0308   LEUKOCYTESUR NEGATIVE 04/03/2017 0308    )No results found for this or any previous visit (from the past 240 hour(s)).    Anti-infectives    None       Radiology Studies: No results found.      Scheduled Meds: . acidophilus  1 capsule Oral Daily  . enoxaparin (LOVENOX) injection  40 mg Subcutaneous Q24H  . furosemide  20 mg Oral Daily  . imatinib  400 mg Oral Q breakfast  . mouth rinse  15 mL Mouth Rinse BID  . metoprolol tartrate  25 mg Oral BID  . olopatadine  1 drop Both Eyes BID   Continuous Infusions:    LOS: 2 days    Time spent: 25 min    [place], DO [facility] Pager [phone]  If 7PM-7AM, please contact night-coverage [email or web address] Password [facility] 04/05/2017, 2:34 PM

## 2017-04-06 LAB — CBC
HEMATOCRIT: 28 % — AB (ref 39.0–52.0)
Hemoglobin: 9.4 g/dL — ABNORMAL LOW (ref 13.0–17.0)
MCH: 35.2 pg — ABNORMAL HIGH (ref 26.0–34.0)
MCHC: 33.6 g/dL (ref 30.0–36.0)
MCV: 104.9 fL — AB (ref 78.0–100.0)
Platelets: 186 10*3/uL (ref 150–400)
RBC: 2.67 MIL/uL — AB (ref 4.22–5.81)
RDW: 13.1 % (ref 11.5–15.5)
WBC: 5.4 10*3/uL (ref 4.0–10.5)

## 2017-04-06 LAB — BASIC METABOLIC PANEL
ANION GAP: 8 (ref 5–15)
BUN: 14 mg/dL (ref 6–20)
CO2: 28 mmol/L (ref 22–32)
CREATININE: 1.13 mg/dL (ref 0.61–1.24)
Calcium: 8.3 mg/dL — ABNORMAL LOW (ref 8.9–10.3)
Chloride: 107 mmol/L (ref 101–111)
GFR calc Af Amer: >60 mL/min (ref >60)
GFR calc non Af Amer: 57 mL/min — ABNORMAL LOW (ref >60)
GLUCOSE: 93 mg/dL (ref 65–99)
POTASSIUM: 3.6 mmol/L (ref 3.5–5.1)
Sodium: 143 mmol/L (ref 135–145)

## 2017-04-06 MED ORDER — CEPHALEXIN 500 MG PO CAPS: 500.0000 mg | ORAL_CAPSULE | Freq: Two times a day (BID) | ORAL | 0 refills | Status: DC

## 2017-04-06 MED ORDER — RISAQUAD PO CAPS: 1.0000 | ORAL_CAPSULE | Freq: Every day | ORAL | 0 refills | Status: DC

## 2017-04-06 MED ORDER — TRAMADOL HCL 50 MG PO TABS: 50.0000 mg | ORAL_TABLET | Freq: Four times a day (QID) | ORAL | 0 refills | Status: DC | PRN

## 2017-04-06 NOTE — Discharge Summary (Signed)
Physician Discharge Summary  [name] [name] GMW:[id number] DOB: [birth year] DOA: 04/03/2017  PCP: Abigail Miyamoto, MD  Admit date: 04/03/2017 Discharge date: 04/06/2017   Recommendations for Outpatient Follow-Up:    follow up for RLQ draining wound-- abx given CBC 1-2 weeks re:Hgb   Discharge Diagnosis:   Principal Problem:   Partial small bowel obstruction (HCC) Active Problems:   Chronic anemia   GIST (gastrointestinal stromal tumor) of small bowel, malignant (HCC)   Hypertension   Diastolic CHF, acute on chronic (HCC)   CKD (chronic kidney disease), stage III   Discharge disposition:  Home.  Discharge Condition: Improved.  Diet recommendation: soft  Wound care: None.   History of Present Illness:   [name] [name] is a 83 y.o. 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  The patient was in his usual state of health until after lunch yesterday when he developed several episodes of watery loose stools. Afterwards he started to develop bloating and abdominal pain, which progressed to heartburn-like indigestion until he came to the ER.     Hospital Course by Problem:   Partial small bowel obstruction: -tolerating soft diet -+flatus and BMs -patient ambulating  Hypertension: -resume PO meds  Chronic diastolic CHF: -resume lasix   GIST: -resume Gleevec  CKD 3: Baseline 1.3, stable.  Anemia: Suspect recent drop from IVF -outpatient CBC     Medical Consultants:    General surgery   Discharge Exam:   Vitals:   04/05/17 2114 04/06/17 0526  BP: (!) 128/46 (!) 135/54  Pulse: 71 68  Resp: 17 17  Temp: 99 F (37.2 C) 98.2 F (36.8 C)   Vitals:   04/05/17 1342 04/05/17 2114 04/06/17 0500 04/06/17 0526  BP: (!) 154/55 (!) 128/46  (!) 135/54  Pulse: 70 71  68  Resp: 18 17  17   Temp: 98 F (36.7 C) 99 F (37.2 C)  98.2 F (36.8 C)  TempSrc: Oral  Oral  Oral  SpO2: 100% 98%  97%  Weight:  79.4 kg (175 lb) 79.3 kg (174 lb 13.2 oz)   Height:  5\' 7"  (1.702 m)      Gen:  NAD    The results of significant diagnostics from this hospitalization (including imaging, microbiology, ancillary and laboratory) are listed below for reference.     Procedures and Diagnostic Studies:   Dg Abdomen 1 View  Result Date: 04/03/2017 CLINICAL DATA:  NG tube placement EXAM: ABDOMEN - 1 VIEW COMPARISON:  CT abdomen and pelvis 04/03/2017 FINDINGS: The enteric tube is not visualized, suggesting location in the upper esophagus. Gas-filled dilated small bowel suggests small bowel obstruction. Surgical clips are present. Degenerative changes in the spine. IMPRESSION: Enteric tube is not visualized suggesting location in the upper esophagus off the field of view. Gas distended small bowel consistent with obstruction. Electronically Signed   By: Lucienne Capers M.D.   On: 04/03/2017 06:32   Dg Chest Port 1 View  Result Date: 04/03/2017 CLINICAL DATA:  Nasogastric tube placement. EXAM: PORTABLE CHEST 1 VIEW COMPARISON:  Abdomen 04/03/2017.  CT 04/03/2017. FINDINGS: NG tube noted coiled in the mid esophagus. Repositioning should be considered. Heart size normal. Mild left base subsegmental atelectasis. No pleural effusion or pneumothorax. No acute bony abnormality. IMPRESSION: NG noted coiled in the esophagus. Repositioning should be considered. Critical Value/emergent results were called by telephone at the time of interpretation on 04/03/2017 at 9:08 am  to Dr. Tyrone Nine, who verbally acknowledged these results. Electronically Signed   By: Marcello Moores  Register   On: 04/03/2017 09:10   Ct Renal Stone Study  Result Date: 04/03/2017 CLINICAL DATA:  Epigastric abdominal pain and diarrhea EXAM: CT ABDOMEN AND PELVIS WITHOUT CONTRAST TECHNIQUE: Multidetector CT imaging of the abdomen and pelvis was performed following the standard protocol without IV contrast. COMPARISON:   09/20/2016, 07/15/2016 FINDINGS: Lower chest: Lung bases demonstrate no acute consolidation or pleural effusion. Borderline heart size. Small esophageal hiatal hernia. Hepatobiliary: Calcified gallstone. Contracted gallbladder. No focal hepatic abnormality. No biliary dilatation. Pancreas: Unremarkable. No pancreatic ductal dilatation or surrounding inflammatory changes. Spleen: Normal in size without focal abnormality. Adrenals/Urinary Tract: Adrenal glands are within normal limits. Kidneys show no hydronephrosis. Bladder unremarkable. Stomach/Bowel: The stomach is slightly enlarged. Multiple fluid-filled loops of slightly dilated small bowel measuring up to 3.2 cm. Bowel anastomosis in the right lower quadrant with slightly enlarged segment of bowel at the suture line. Some dilated bowel leading up to the bowel containing right inguinal hernia. Diverticular disease of the colon. No colon wall thickening. Vascular/Lymphatic: Atherosclerotic vascular calcifications. No grossly enlarged lymph nodes. Reproductive: Prostate is unremarkable. Other: No free air. Small amount of ascites in the right upper quadrant adjacent to the liver Musculoskeletal: Degenerative changes. No acute or suspicious bone lesion. IMPRESSION: 1. Multiple loops of fluid-filled slightly enlarged small bowel suggesting early or partial bowel obstruction ; this may be related to the right inguinal hernia as there appears to be some dilated small bowel upstream to the hernia sac. 2. Small amount of ascites adjacent to the liver 3. Negative for kidney stones or hydronephrosis 4. Gallstones Electronically Signed   By: Donavan Foil M.D.   On: 04/03/2017 03:51     Labs:   Basic Metabolic Panel:  Recent Labs Lab 04/03/17 0200 04/04/17 0312 04/06/17 0356  NA 141 142 143  K 4.2 3.9 3.6  CL 107 111 107  CO2  --  24 28  GLUCOSE 165* 121* 93  BUN 21* 26* 14  CREATININE 1.40* 1.19 1.13  CALCIUM  --  8.6* 8.3*   GFR Estimated Creatinine  Clearance: 44.7 mL/min (by C-G formula based on SCr of 1.13 mg/dL). Liver Function Tests:  Recent Labs Lab 04/03/17 0112  AST 24  ALT 17  ALKPHOS 55  BILITOT 0.8  PROT 6.6  ALBUMIN 3.8   No results for input(s): LIPASE, AMYLASE in the last 168 hours. No results for input(s): AMMONIA in the last 168 hours. Coagulation profile No results for input(s): INR, PROTIME in the last 168 hours.  CBC:  Recent Labs Lab 04/03/17 0112 04/03/17 0200 04/04/17 0312 04/06/17 0356  WBC 9.4  --  5.6 5.4  NEUTROABS 8.5*  --   --   --   HGB 11.3* 11.6* 9.9* 9.4*  HCT 34.2* 34.0* 30.5* 28.0*  MCV 105.6*  --  105.9* 104.9*  PLT 211  --  196 186   Cardiac Enzymes:  Recent Labs Lab 04/03/17 0909  TROPONINI 0.03*   BNP: Invalid input(s): POCBNP CBG: No results for input(s): GLUCAP in the last 168 hours. D-Dimer No results for input(s): DDIMER in the last 72 hours. Hgb A1c No results for input(s): HGBA1C in the last 72 hours. Lipid Profile No results for input(s): CHOL, HDL, LDLCALC, TRIG, CHOLHDL, LDLDIRECT in the last 72 hours. Thyroid function studies No results for input(s): TSH, T4TOTAL, T3FREE, THYROIDAB in the last 72 hours.  Invalid input(s): FREET3 Anemia work up No  results for input(s): VITAMINB12, FOLATE, FERRITIN, TIBC, IRON, RETICCTPCT in the last 72 hours. Microbiology No results found for this or any previous visit (from the past 240 hour(s)).   Discharge Instructions:   Discharge Instructions    Discharge instructions    Complete by:  As directed    Soft diet   Increase activity slowly    Complete by:  As directed      Allergies as of 04/06/2017   No Known Allergies     Medication List    TAKE these medications   acidophilus Caps capsule Take 1 capsule by mouth daily.   amLODipine 5 MG tablet Commonly known as:  NORVASC Take 5 mg by mouth daily.   cephALEXin 500 MG capsule Commonly known as:  KEFLEX Take 1 capsule (500 mg total) by mouth every  12 (twelve) hours.   furosemide 20 MG tablet Commonly known as:  LASIX Take 1 tablet (20 mg total) by mouth daily.   GLEEVEC 400 MG tablet Generic drug:  imatinib TAKE 1 TABLET BY MOUTH DAILY. TAKE WITH MEALS AND LARGE GLASS OF WATER. CAUTION:CHEMOTHERAPY.   metoprolol tartrate 25 MG tablet Commonly known as:  LOPRESSOR Take 1 tablet (25 mg total) by mouth 2 (two) times daily.   simvastatin 20 MG tablet Commonly known as:  ZOCOR Take 20 mg by mouth every evening.   traMADol 50 MG tablet Commonly known as:  ULTRAM Take 1 tablet (50 mg total) by mouth every 6 (six) hours as needed for moderate pain.      Follow-up Information    FRIED, ROBERT L, MD Follow up in 1 week(s).   Specialty:  Family Medicine Contact information: [address] [place] [address] [phone]            Time coordinating discharge: 35 min  Signed:  Geradine Girt   [facility] 04/06/2017, 8:06 AM

## 2017-04-06 NOTE — Progress Notes (Signed)
Patient discharged to home with instructions and prescriptions, transported by his son.

## 2017-04-06 NOTE — Final Consult Note (Signed)
Consultant Final Sign-Off Note    Assessment/Final recommendations  [name] [name] is a 83 y.o. male followed by me for SBO   Wound care (if applicable): dry dressing as needed on RLQ wound and abx to cover skin flora/cellulitis   Diet at discharge: regular diet per primary   Activity at discharge: per primary team   Follow-up appointment:  Follow up as needed for wound with Dr. [name] in 1-2 weeks if not resolved   Pending results:  Unresulted Labs    Start     Ordered   04/10/17 0500  Creatinine, serum  (enoxaparin (LOVENOX)    CrCl >/= 30 ml/min)  Weekly,   R    Comments:  while on enoxaparin therapy    04/03/17 0858       Medication recommendations: abx for cellulitis per primary   Other recommendations:    Thank you for allowing [place] to participate in the care of your patient!  Please consult [place] again if you have further needs for your patient.  Kalman Drape 04/06/2017 8:38 AM    Subjective   Pt is feeling good. Having bowel function. No nausea, vomiting, fever or chills. Pt endorses a wound in his RLQ that is actively draining. He states a drain was removed roughly 8 months ago from appendicitis. The drain was in place for roughly a year. He states roughly every 3 months he experiences drainage or an abscess in this area. He states he has been seen by Dr. [name] before to have this wound drained. He is given abx and it resolves.  Objective  Vital signs in last 24 hours: Temp:  [98 F (36.7 C)-99 F (37.2 C)] 98.2 F (36.8 C) (04/19 0526) Pulse Rate:  [68-71] 68 (04/19 0526) Resp:  [17-18] 17 (04/19 0526) BP: (128-154)/(46-55) 135/54 (04/19 0526) SpO2:  [97 %-100 %] 97 % (04/19 0526) Weight:  [174 lb 13.2 oz (79.3 kg)-175 lb (79.4 kg)] 174 lb 13.2 oz (79.3 kg) (04/19 0500)  General: Physical Exam  Constitutional: He appears well-developed and well-nourished. No distress.  Cardiovascular: Normal rate, regular rhythm and normal heart sounds.   No murmur  heard. Pulmonary/Chest: Effort normal and breath sounds normal. No respiratory distress. He has no wheezes. He has no rales.  Abdominal: Soft. Bowel sounds are normal. He exhibits no distension. There is no tenderness.  Small open wound, roughly 1cm in diameter noted to RLQ with bloody mixed with scant purulent drainage, mild induration noted, and tender.  Skin: He is not diaphoretic.  Nursing note and vitals reviewed.    Pertinent labs and Studies:  Recent Labs  04/04/17 0312 04/06/17 0356  WBC 5.6 5.4  HGB 9.9* 9.4*  HCT 30.5* 28.0*   BMET  Recent Labs  04/04/17 0312 04/06/17 0356  NA 142 143  K 3.9 3.6  CL 111 107  CO2 24 28  GLUCOSE 121* 93  BUN 26* 14  CREATININE 1.19 1.13  CALCIUM 8.6* 8.3*   No results for input(s): LABURIN in the last 72 hours. Results for orders placed or performed during the hospital encounter of 07/26/15  Culture, blood (x 2)     Status: None   Collection Time: 07/26/15  6:10 PM  Result Value Ref Range Status   Specimen Description BLOOD LEFT ARM  Final   Special Requests   Final    BOTTLES DRAWN AEROBIC AND ANAEROBIC BLUE 10CC RED Micro   Culture   Final    NO GROWTH 5 DAYS Performed at  [facility]    Report Status 08/01/2015 FINAL  Final  Culture, blood (x 2)     Status: None   Collection Time: 07/26/15  6:22 PM  Result Value Ref Range Status   Specimen Description BLOOD LEFT HAND  Final   Special Requests   Final    BOTTLES DRAWN AEROBIC AND ANAEROBIC BLUE 10CC RED [place]   Culture   Final    NO GROWTH 5 DAYS Performed at [facility]    Report Status 08/01/2015 FINAL  Final    Imaging: No results found.   Jackson Latino, [facility] Surgery Pager [phone]

## 2017-04-10 ENCOUNTER — Other Ambulatory Visit: Payer: Self-pay | Admitting: Oncology

## 2017-04-10 DIAGNOSIS — I2510 Atherosclerotic heart disease of native coronary artery without angina pectoris: Secondary | ICD-10-CM | POA: Diagnosis not present

## 2017-04-10 DIAGNOSIS — Z09 Encounter for follow-up examination after completed treatment for conditions other than malignant neoplasm: Secondary | ICD-10-CM | POA: Diagnosis not present

## 2017-04-10 DIAGNOSIS — Z0000 Encounter for general adult medical examination without abnormal findings: Secondary | ICD-10-CM | POA: Diagnosis not present

## 2017-04-10 DIAGNOSIS — E782 Mixed hyperlipidemia: Secondary | ICD-10-CM | POA: Diagnosis not present

## 2017-04-10 DIAGNOSIS — I10 Essential (primary) hypertension: Secondary | ICD-10-CM | POA: Diagnosis not present

## 2017-04-10 DIAGNOSIS — D509 Iron deficiency anemia, unspecified: Secondary | ICD-10-CM | POA: Diagnosis not present

## 2017-04-10 DIAGNOSIS — D649 Anemia, unspecified: Secondary | ICD-10-CM | POA: Diagnosis not present

## 2017-04-11 MED FILL — GLEEVEC 400 MG TABLET: 400 | 30 days supply | Qty: 30 | Fill #0

## 2017-05-05 MED FILL — GLEEVEC 400 MG TABLET: 400 | 30 days supply | Qty: 30 | Fill #0

## 2017-05-16 ENCOUNTER — Ambulatory Visit (HOSPITAL_BASED_OUTPATIENT_CLINIC_OR_DEPARTMENT_OTHER): Payer: Medicare Other | Admitting: Oncology

## 2017-05-16 ENCOUNTER — Telehealth: Payer: Self-pay | Admitting: Oncology

## 2017-05-16 ENCOUNTER — Other Ambulatory Visit (HOSPITAL_BASED_OUTPATIENT_CLINIC_OR_DEPARTMENT_OTHER): Payer: Medicare Other

## 2017-05-16 VITALS — BP 181/70 | HR 45 | Temp 98.0°F | Resp 18 | Ht 67.0 in | Wt 167.1 lb

## 2017-05-16 DIAGNOSIS — C49A3 Gastrointestinal stromal tumor of small intestine: Secondary | ICD-10-CM | POA: Diagnosis not present

## 2017-05-16 LAB — COMPREHENSIVE METABOLIC PANEL
ALT: 23 U/L (ref 0–55)
ANION GAP: 7 meq/L (ref 3–11)
AST: 24 U/L (ref 5–34)
Albumin: 3.8 g/dL (ref 3.5–5.0)
Alkaline Phosphatase: 71 U/L (ref 40–150)
BUN: 16.3 mg/dL (ref 7.0–26.0)
CHLORIDE: 108 meq/L (ref 98–109)
CO2: 28 meq/L (ref 22–29)
Calcium: 8.8 mg/dL (ref 8.4–10.4)
Creatinine: 1.2 mg/dL (ref 0.7–1.3)
EGFR: 54 mL/min/{1.73_m2} — AB (ref >90)
Glucose: 103 mg/dl (ref 70–140)
POTASSIUM: 3.7 meq/L (ref 3.5–5.1)
Sodium: 143 mEq/L (ref 136–145)
Total Bilirubin: 0.37 mg/dL (ref 0.20–1.20)
Total Protein: 6.6 g/dL (ref 6.4–8.3)

## 2017-05-16 LAB — CBC WITH DIFFERENTIAL/PLATELET
BASO%: 0.5 % (ref 0.0–2.0)
BASOS ABS: 0 10*3/uL (ref 0.0–0.1)
EOS%: 4.1 % (ref 0.0–7.0)
Eosinophils Absolute: 0.2 10*3/uL (ref 0.0–0.5)
HCT: 34.2 % — ABNORMAL LOW (ref 38.4–49.9)
HGB: 11.8 g/dL — ABNORMAL LOW (ref 13.0–17.1)
LYMPH%: 23.2 % (ref 14.0–49.0)
MCH: 36.7 pg — AB (ref 27.2–33.4)
MCHC: 34.6 g/dL (ref 32.0–36.0)
MCV: 106 fL — AB (ref 79.3–98.0)
MONO#: 0.5 10*3/uL (ref 0.1–0.9)
MONO%: 9.3 % (ref 0.0–14.0)
NEUT#: 3.3 10*3/uL (ref 1.5–6.5)
NEUT%: 62.9 % (ref 39.0–75.0)
PLATELETS: 204 10*3/uL (ref 140–400)
RBC: 3.23 10*6/uL — AB (ref 4.20–5.82)
RDW: 13.3 % (ref 11.0–14.6)
WBC: 5.2 10*3/uL (ref 4.0–10.3)
lymph#: 1.2 10*3/uL (ref 0.9–3.3)

## 2017-05-16 MED ORDER — GLEEVEC 400 MG PO TABS: 400.0000 mg | ORAL_TABLET | Freq: Every day | ORAL | 5 refills | Status: DC

## 2017-05-16 NOTE — Progress Notes (Signed)
[facility] OFFICE PROGRESS NOTE   Diagnosis: Gastrointestinal stromal tumor  INTERVAL HISTORY:   [name] [name] returns as scheduled. He was admitted 04/04/2007 with a partial small bowel obstruction. His symptoms resolved with bowel rest. He developed erythema and drainage at the right lower quadrant drain site while in the hospital. He completed a course of cephalexin. The obstruction was felt to be related to adhesions. [name] [name] continues [place]. No diarrhea. Good appetite and energy level. He did not take his blood pressure medication or furosemide today.  Objective:  Vital signs in last 24 hours:  Blood pressure (!) 181/70, pulse (!) 45, temperature 98 F (36.7 C), temperature source Oral, resp. rate 18, height _0  (1.702 m), weight 167 lb 1.6 oz (75.8 kg), SpO2 99 %. Manual pulse-80    HEENT: No thrush or ulcers Resp: Lungs clear bilaterally Cardio: Premature beats and grouped beats. GI: No hepatosplenomegaly, no mass, no apparent ascites, nontender Vascular: Trace low leg edema bilaterally  Skin: No rash  , right lower quadrant drain site is healed. No evidence of infection.    Lab Results:  Lab Results  Component Value Date   WBC 5.2 05/16/2017   HGB 11.8 (L) 05/16/2017   HCT 34.2 (L) 05/16/2017   MCV 106.0 (H) 05/16/2017   PLT 204 05/16/2017   NEUTROABS 3.3 05/16/2017    CMP     Component Value Date/Time   NA 143 05/16/2017 1158   K 3.7 05/16/2017 1158   CL 107 04/06/2017 0356   CO2 28 05/16/2017 1158   GLUCOSE 103 05/16/2017 1158   BUN 16.3 05/16/2017 1158   CREATININE 1.2 05/16/2017 1158   CALCIUM 8.8 05/16/2017 1158   PROT 6.6 05/16/2017 1158   ALBUMIN 3.8 05/16/2017 1158   AST 24 05/16/2017 1158   ALT 23 05/16/2017 1158   ALKPHOS 71 05/16/2017 1158   BILITOT 0.37 05/16/2017 1158   GFRNONAA 57 (L) 04/06/2017 0356   GFRAA >60 04/06/2017 0356     Medications: I have reviewed the patient's current  medications.  Assessment/Plan: 1. 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  CT 10/16/2014 consistent with extensive carcinomatosis, CT-guided biopsy of an omental mass 10/21/2014 confirmed a gastrointestinal stromal tumor  Initiation of Gleevec 10/31/2014  CT 01/13/2015 revealed improvement in the peritoneal and omental metastatic disease  CT 05/05/2015 with no progression of omental/peritoneal metastatic disease, increased ascites, and appendix inflammation  CT 07/07/2016-no abscess, mild perihepatic and left pericolic ascites and mesenteric edema  CT of the pelvis 09/20/2016, no residual abscess, no ascites  CT abdomen pelvis renal stones study 04/03/2017-partial small bowel obstruction, potentially related to right inguinal hernia  2. History of anemia, status post a nondiagnostic bone marrow biopsy 10/21/2014  3. NSTEMI March 2014  4. Admission 05/06/2015 with acute onset right abdomen pain-potentially related to acute appendicitis versus pain from carcinomatosis  CT 05/13/2015 consistent with a right lower abdomen abscess, status post catheter drainage by interventional radiology 05/13/2015  Follow-up CT 05/19/2015 showed resolution of the abscess.  Follow-up evaluation in interventional radiology 06/01/2015 showed the abscess cavity had resolved. The abscess drainage catheter communicated with the cecum via the appendix. The catheter was left in place.  CT abdomen/pelvis 06/08/2015 showed the right pelvic drain in place without recurrent or residual surrounding fluid collection. Peritoneal metastasis with slight increase in small to moderate volume of abdominal pelvic ascites.  Drainage catheter injection 06/12/2015 showed residual tiny fistulous connection with the end of the decompressed  abscess cavity within the right lower abdominal quadrant and the residual lumen of the appendix.  Paracentesis 06/12/2015 with 5 liters of  fluid removed  Paracentesis 06/19/2015-negative cytology and culture  Removal of abscess drainage catheter 06/23/2015  CT 07/26/2015 with increased right abdominal wall inflammation and increased fluid collection at site of previous right abdomen abscess, ascites, and enlargement/inflammation of the appendix  Placement of right lower quadrant percutaneous drainage catheter 07/27/2015. Contrast injection confirmed persistent fistulous connection with the ill-defined fluid collection and the residual appendix and cecum. Paracentesis with 2 L of fluid removed.  Follow-up CT abdomen/pelvis 08/05/2015 with small to moderate left pleural effusion and moderate to large volume ascites with both appearing slightly decreased since the prior study. Right lower quadrant drainage catheter in place. 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 Drainage catheter left in place.  Tube evaluation 02/10/2016, persistent fistula  Tube evaluation 03/01/2016, continued fistula to the cecum  Tube downsized 05/04/2016, no residual abscess seen  Collapsed abscess with a stable fistula to the cecum on imaging 06/16/2016  Tube removed 07/26/2016   5. C. difficile colitis 05/10/2015  6. Right leg edema 12/18/2015. Negative venous Doppler.   Disposition:  [name] [name] appears stable. There is no clinical evidence for progression of the gastrointestinal stromal tumor. He will continue Gleevec. He will return for an office and lab visit in 2 months.  15 minutes were spent with the patient today. The majority of the time was used for counseling and coordination of care.  Donneta Romberg, MD  05/16/2017  1:25 PM

## 2017-05-16 NOTE — Telephone Encounter (Signed)
Lab and follow up scheduled for 07/11/17, per 05/16/17 los. Patient was given a copy of the AVS report and appointment schedule,per 05/16/17 los.

## 2017-05-28 MED FILL — GLEEVEC 400 MG TABLET: 400 | 30 days supply | Qty: 30 | Fill #0

## 2017-06-07 DIAGNOSIS — H109 Unspecified conjunctivitis: Secondary | ICD-10-CM | POA: Diagnosis not present

## 2017-06-13 DIAGNOSIS — L570 Actinic keratosis: Secondary | ICD-10-CM | POA: Diagnosis not present

## 2017-06-13 DIAGNOSIS — X32XXXD Exposure to sunlight, subsequent encounter: Secondary | ICD-10-CM | POA: Diagnosis not present

## 2017-06-26 DIAGNOSIS — Z961 Presence of intraocular lens: Secondary | ICD-10-CM | POA: Diagnosis not present

## 2017-06-26 DIAGNOSIS — H10503 Unspecified blepharoconjunctivitis, bilateral: Secondary | ICD-10-CM | POA: Diagnosis not present

## 2017-07-03 MED FILL — GLEEVEC 400 MG TABLET: 400 | 30 days supply | Qty: 30 | Fill #1

## 2017-07-11 ENCOUNTER — Telehealth: Payer: Self-pay | Admitting: Nurse Practitioner

## 2017-07-11 ENCOUNTER — Other Ambulatory Visit (HOSPITAL_BASED_OUTPATIENT_CLINIC_OR_DEPARTMENT_OTHER): Payer: Medicare Other

## 2017-07-11 ENCOUNTER — Ambulatory Visit (HOSPITAL_BASED_OUTPATIENT_CLINIC_OR_DEPARTMENT_OTHER): Payer: Medicare Other | Admitting: Nurse Practitioner

## 2017-07-11 VITALS — BP 174/58 | HR 75 | Temp 97.9°F | Resp 18 | Ht 67.0 in | Wt 167.0 lb

## 2017-07-11 DIAGNOSIS — C762 Malignant neoplasm of abdomen: Secondary | ICD-10-CM

## 2017-07-11 DIAGNOSIS — C49A3 Gastrointestinal stromal tumor of small intestine: Secondary | ICD-10-CM | POA: Diagnosis not present

## 2017-07-11 DIAGNOSIS — C786 Secondary malignant neoplasm of retroperitoneum and peritoneum: Secondary | ICD-10-CM | POA: Diagnosis not present

## 2017-07-11 LAB — COMPREHENSIVE METABOLIC PANEL
ALT: 13 U/L (ref 0–55)
AST: 19 U/L (ref 5–34)
Albumin: 3.8 g/dL (ref 3.5–5.0)
Alkaline Phosphatase: 65 U/L (ref 40–150)
Anion Gap: 9 mEq/L (ref 3–11)
BILIRUBIN TOTAL: 0.51 mg/dL (ref 0.20–1.20)
BUN: 18.7 mg/dL (ref 7.0–26.0)
CHLORIDE: 109 meq/L (ref 98–109)
CO2: 25 meq/L (ref 22–29)
Calcium: 9 mg/dL (ref 8.4–10.4)
Creatinine: 1.3 mg/dL (ref 0.7–1.3)
EGFR: 48 mL/min/{1.73_m2} — ABNORMAL LOW (ref >90)
Glucose: 95 mg/dl (ref 70–140)
Potassium: 4 mEq/L (ref 3.5–5.1)
Sodium: 144 mEq/L (ref 136–145)
TOTAL PROTEIN: 6.9 g/dL (ref 6.4–8.3)

## 2017-07-11 LAB — CBC WITH DIFFERENTIAL/PLATELET
BASO%: 0.3 % (ref 0.0–2.0)
Basophils Absolute: 0 10*3/uL (ref 0.0–0.1)
EOS%: 14.8 % — AB (ref 0.0–7.0)
Eosinophils Absolute: 0.9 10*3/uL — ABNORMAL HIGH (ref 0.0–0.5)
HEMATOCRIT: 33.6 % — AB (ref 38.4–49.9)
HGB: 11.2 g/dL — ABNORMAL LOW (ref 13.0–17.1)
LYMPH#: 1.3 10*3/uL (ref 0.9–3.3)
LYMPH%: 21.7 % (ref 14.0–49.0)
MCH: 35.3 pg — ABNORMAL HIGH (ref 27.2–33.4)
MCHC: 33.3 g/dL (ref 32.0–36.0)
MCV: 106 fL — ABNORMAL HIGH (ref 79.3–98.0)
MONO#: 0.7 10*3/uL (ref 0.1–0.9)
MONO%: 10.9 % (ref 0.0–14.0)
NEUT%: 52.3 % (ref 39.0–75.0)
NEUTROS ABS: 3.2 10*3/uL (ref 1.5–6.5)
Platelets: 198 10*3/uL (ref 140–400)
RBC: 3.17 10*6/uL — AB (ref 4.20–5.82)
RDW: 13.4 % (ref 11.0–14.6)
WBC: 6.1 10*3/uL (ref 4.0–10.3)

## 2017-07-11 NOTE — Progress Notes (Signed)
[name] OFFICE PROGRESS NOTE   Diagnosis: Gastrointestinal stromal tumor   INTERVAL HISTORY:   [name] [name] returns as scheduled. He continues [place]. He feels well. He has a good appetite. He exercises frequently. He denies pain. No nausea or vomiting. No diarrhea. No rash. He denies pain.  Objective:  Vital signs in last 24 hours:  Blood pressure (!) 174/58, pulse 75, temperature 97.9 F (36.6 C), temperature source Oral, resp. rate 18, height 5' 7"  (1.702 m), weight 167 lb (75.8 kg), SpO2 100 %.    HEENT: No thrush or ulcers. Lymphatics: No palpable cervical, supraclavicular or axillary lymph nodes. Resp: Lungs clear bilaterally. Cardio: Regular rate and rhythm. GI: Abdomen soft and nontender. No hepatosplenomegaly. No mass. Vascular: Trace lower leg edema bilaterally. Skin: No rash. Right lower quadrant drain site is healed. No evidence of infection.    Lab Results:  Lab Results  Component Value Date   WBC 6.1 07/11/2017   HGB 11.2 (L) 07/11/2017   HCT 33.6 (L) 07/11/2017   MCV 106.0 (H) 07/11/2017   PLT 198 07/11/2017   NEUTROABS 3.2 07/11/2017    Imaging:  No results found.  Medications: I have reviewed the patient's current medications.  Assessment/Plan: 1. 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  CT 10/16/2014 consistent with extensive carcinomatosis, CT-guided biopsy of an omental mass 10/21/2014 confirmed a gastrointestinal stromal tumor  Initiation of Gleevec 10/31/2014  CT 01/13/2015 revealed improvement in the peritoneal and omental metastatic disease  CT 05/05/2015 with no progression of omental/peritoneal metastatic disease, increased ascites, and appendix inflammation  CT 07/07/2016-no abscess, mild perihepatic and left pericolic ascites and mesenteric edema  CT of the pelvis 09/20/2016, no residual abscess, no ascites  CT abdomen pelvis renal stones study 04/03/2017-partial  small bowel obstruction, potentially related to right inguinal hernia  2. History of anemia, status post a nondiagnostic bone marrow biopsy 10/21/2014  3. NSTEMI March 2014  4. Admission 05/06/2015 with acute onset right abdomen pain-potentially related to acute appendicitis versus pain from carcinomatosis  CT 05/13/2015 consistent with a right lower abdomen abscess, status post catheter drainage by interventional radiology 05/13/2015  Follow-up CT 05/19/2015 showed resolution of the abscess.  Follow-up evaluation in interventional radiology 06/01/2015 showed the abscess cavity had resolved. The abscess drainage catheter communicated with the cecum via the appendix. The catheter was left in place.  CT abdomen/pelvis 06/08/2015 showed the right pelvic drain in place without recurrent or residual surrounding fluid collection. Peritoneal metastasis with slight increase in small to moderate volume of abdominal pelvic ascites.  Drainage catheter injection 06/12/2015 showed residual tiny fistulous connection with the end of the decompressed abscess cavity within the right lower abdominal quadrant and the residual lumen of the appendix.  Paracentesis 06/12/2015 with 5 liters of fluid removed  Paracentesis 06/19/2015-negative cytology and culture  Removal of abscess drainage catheter 06/23/2015  CT 07/26/2015 with increased right abdominal wall inflammation and increased fluid collection at site of previous right abdomen abscess, ascites, and enlargement/inflammation of the appendix  Placement of right lower quadrant percutaneous drainage catheter 07/27/2015. Contrast injection confirmed persistent fistulous connection with the ill-defined fluid collection and the residual appendix and cecum. Paracentesis with 2 L of fluid removed.  Follow-up CT abdomen/pelvis 08/05/2015 with small to moderate left pleural effusion and moderate to large volume ascites with both appearing slightly decreased  since the prior study. Right lower quadrant drainage catheter in place. 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 Drainage catheter left in place.  Tube evaluation 02/10/2016, persistent fistula  Tube evaluation 03/01/2016, continued fistula to the cecum  Tube downsized 05/04/2016, no residual abscess seen  Collapsed abscess with a stable fistula to the cecum on imaging 06/16/2016  Tube removed 07/26/2016   5. C. difficile colitis 05/10/2015  6. Right leg edema 12/18/2015. Negative venous Doppler.   Disposition: [name] [name] appears stable. There is no clinical evidence of progression of the gastrointestinal stromal tumor. He will continue Gleevec. He will return for labs and a follow-up visit in 2 months. He will contact the office in the interim with any problems.    Ned Card ANP/GNP-BC   07/11/2017  10:05 AM

## 2017-07-11 NOTE — Telephone Encounter (Signed)
Scheduled appt per 7/24 los - Gave patient AVS and calender per los.  

## 2017-07-14 ENCOUNTER — Telehealth: Payer: Self-pay | Admitting: Pharmacist

## 2017-07-14 NOTE — Telephone Encounter (Signed)
Oral Chemotherapy Pharmacist Encounter  Follow-Up Form  Called patient today to follow up regarding patient's oral chemotherapy medication: Gleevec  Original Start date of oral chemotherapy: 10/31/14  Pt is doing well today  Pt reports 0 tablets/doses of [place] missed in the last 2 weeks.   Pt reports the following side effects: N/A, patient reported he has not had any side effects from the [facility]  Recent labs reviewed: CMP and CBC from 07/11/17, no laboratory concerns  Other Issues: N/A  Patient knows to call the office with questions or concerns. [facility] will continue to follow.  Thank you,  Nuala Alpha, PharmD, BCPS 07/14/2017 3:28 PM [facility] [phone]

## 2017-07-26 MED FILL — GLEEVEC 400 MG TABLET: 400 | 30 days supply | Qty: 30 | Fill #2

## 2017-07-27 DIAGNOSIS — E782 Mixed hyperlipidemia: Secondary | ICD-10-CM | POA: Diagnosis not present

## 2017-07-27 DIAGNOSIS — I10 Essential (primary) hypertension: Secondary | ICD-10-CM | POA: Diagnosis not present

## 2017-08-10 ENCOUNTER — Emergency Department (HOSPITAL_COMMUNITY): Payer: Medicare Other

## 2017-08-10 ENCOUNTER — Encounter (HOSPITAL_COMMUNITY): Payer: Self-pay

## 2017-08-10 DIAGNOSIS — C49A4 Gastrointestinal stromal tumor of large intestine: Secondary | ICD-10-CM | POA: Diagnosis not present

## 2017-08-10 DIAGNOSIS — E785 Hyperlipidemia, unspecified: Secondary | ICD-10-CM | POA: Diagnosis not present

## 2017-08-10 DIAGNOSIS — R072 Precordial pain: Secondary | ICD-10-CM | POA: Diagnosis not present

## 2017-08-10 DIAGNOSIS — I129 Hypertensive chronic kidney disease with stage 1 through stage 4 chronic kidney disease, or unspecified chronic kidney disease: Secondary | ICD-10-CM | POA: Insufficient documentation

## 2017-08-10 DIAGNOSIS — I2510 Atherosclerotic heart disease of native coronary artery without angina pectoris: Secondary | ICD-10-CM | POA: Diagnosis not present

## 2017-08-10 DIAGNOSIS — R079 Chest pain, unspecified: Secondary | ICD-10-CM | POA: Diagnosis not present

## 2017-08-10 DIAGNOSIS — N183 Chronic kidney disease, stage 3 (moderate): Secondary | ICD-10-CM | POA: Insufficient documentation

## 2017-08-10 DIAGNOSIS — R9431 Abnormal electrocardiogram [ECG] [EKG]: Secondary | ICD-10-CM | POA: Diagnosis not present

## 2017-08-10 DIAGNOSIS — R0789 Other chest pain: Secondary | ICD-10-CM | POA: Diagnosis not present

## 2017-08-10 LAB — CBC
HEMATOCRIT: 33.2 % — AB (ref 39.0–52.0)
Hemoglobin: 11.3 g/dL — ABNORMAL LOW (ref 13.0–17.0)
MCH: 35.2 pg — AB (ref 26.0–34.0)
MCHC: 34 g/dL (ref 30.0–36.0)
MCV: 103.4 fL — AB (ref 78.0–100.0)
Platelets: 199 10*3/uL (ref 150–400)
RBC: 3.21 MIL/uL — AB (ref 4.22–5.81)
RDW: 13.6 % (ref 11.5–15.5)
WBC: 8.3 10*3/uL (ref 4.0–10.5)

## 2017-08-10 NOTE — ED Triage Notes (Signed)
Pt from home with complaint of nonradiating chest pain since this morning. Denies any associated sx. Hypertensive in triage. Has hx of non stemi.

## 2017-08-11 ENCOUNTER — Emergency Department (HOSPITAL_COMMUNITY)
Admission: EM | Admit: 2017-08-11 | Discharge: 2017-08-11 | Disposition: A | Discharge status: Home / Self Care | Payer: Medicare Other | Attending: Emergency Medicine | Admitting: Emergency Medicine

## 2017-08-11 DIAGNOSIS — C49A4 Gastrointestinal stromal tumor of large intestine: Secondary | ICD-10-CM | POA: Diagnosis not present

## 2017-08-11 DIAGNOSIS — R072 Precordial pain: Secondary | ICD-10-CM

## 2017-08-11 DIAGNOSIS — I2510 Atherosclerotic heart disease of native coronary artery without angina pectoris: Secondary | ICD-10-CM | POA: Diagnosis not present

## 2017-08-11 DIAGNOSIS — R0789 Other chest pain: Secondary | ICD-10-CM | POA: Diagnosis not present

## 2017-08-11 LAB — TROPONIN I
TROPONIN I: 0.04 ng/mL — AB (ref <0.03)
TROPONIN I: 0.03 ng/mL — AB (ref <0.03)
Troponin I: 0.03 ng/mL (ref <0.03)

## 2017-08-11 LAB — BASIC METABOLIC PANEL
ANION GAP: 9 (ref 5–15)
BUN: 12 mg/dL (ref 6–20)
CHLORIDE: 101 mmol/L (ref 101–111)
CO2: 24 mmol/L (ref 22–32)
Calcium: 9 mg/dL (ref 8.9–10.3)
Creatinine, Ser: 1.18 mg/dL (ref 0.61–1.24)
GFR calc non Af Amer: 54 mL/min — ABNORMAL LOW (ref >60)
GLUCOSE: 177 mg/dL — AB (ref 65–99)
Potassium: 3.2 mmol/L — ABNORMAL LOW (ref 3.5–5.1)
Sodium: 134 mmol/L — ABNORMAL LOW (ref 135–145)

## 2017-08-11 MED ORDER — GI COCKTAIL ~~LOC~~
30.0000 mL | Freq: Once | ORAL | Status: AC
  Administered 2017-08-11: 30 mL via ORAL
  Filled 2017-08-11: qty 30

## 2017-08-11 NOTE — Discharge Instructions (Signed)
You have been seen in the Emergency Department (ED) today for chest pain.   Please follow up with the recommended doctor as instructed above in these documents regarding today?s emergent visit and your recent symptoms to discuss further management.  Continue to take your regular medications. If you are not doing so already, please also take a daily baby aspirin (81 mg), at least until you follow up with your doctor.  Return to the Emergency Department (ED) if you experience any further chest pain/pressure/tightness, difficulty breathing, or sudden sweating, or other symptoms that concern you.   Chest Pain (Nonspecific) It is often hard to give a specific diagnosis for the cause of chest pain. There is always a chance that your pain could be related to something serious, such as a heart attack or a blood clot in the lungs. You need to follow up with your health care provider for further evaluation. CAUSES  Heartburn. Pneumonia or bronchitis. Anxiety or stress. Inflammation around your heart (pericarditis) or lung (pleuritis or pleurisy). A blood clot in the lung. A collapsed lung (pneumothorax). It can develop suddenly on its own (spontaneous pneumothorax) or from trauma to the chest. Shingles infection (herpes zoster virus). The chest wall is composed of bones, muscles, and cartilage. Any of these can be the source of the pain. The bones can be bruised by injury. The muscles or cartilage can be strained by coughing or overwork. The cartilage can be affected by inflammation and become sore (costochondritis). DIAGNOSIS  Lab tests or other studies may be needed to find the cause of your pain. Your health care provider may have you take a test called an ambulatory electrocardiogram (ECG). An ECG records your heartbeat patterns over a 24-hour period. You may also have other tests, such as: Transthoracic echocardiogram (TTE). During echocardiography, sound waves are used to evaluate how blood flows  through your heart. Transesophageal echocardiogram (TEE). Cardiac monitoring. This allows your health care provider to monitor your heart rate and rhythm in real time. Holter monitor. This is a portable device that records your heartbeat and can help diagnose heart arrhythmias. It allows your health care provider to track your heart activity for several days, if needed. Stress tests by exercise or by giving medicine that makes the heart beat faster. TREATMENT  Treatment depends on what may be causing your chest pain. Treatment may include: Acid blockers for heartburn. Anti-inflammatory medicine. Pain medicine for inflammatory conditions. Antibiotics if an infection is present. You may be advised to change lifestyle habits. This includes stopping smoking and avoiding alcohol, caffeine, and chocolate. You may be advised to keep your head raised (elevated) when sleeping. This reduces the chance of acid going backward from your stomach into your esophagus. Most of the time, nonspecific chest pain will improve within 2-3 days with rest and mild pain medicine.  HOME CARE INSTRUCTIONS  If antibiotics were prescribed, take them as directed. Finish them even if you start to feel better. For the next few days, avoid physical activities that bring on chest pain. Continue physical activities as directed. Do not use any tobacco products, including cigarettes, chewing tobacco, or electronic cigarettes. Avoid drinking alcohol. Only take medicine as directed by your health care provider. Follow your health care provider's suggestions for further testing if your chest pain does not go away. Keep any follow-up appointments you made. If you do not go to an appointment, you could develop lasting (chronic) problems with pain. If there is any problem keeping an appointment, call to reschedule.  SEEK MEDICAL CARE IF:  Your chest pain does not go away, even after treatment. You have a rash with blisters on your  chest. You have a fever. SEEK IMMEDIATE MEDICAL CARE IF:  You have increased chest pain or pain that spreads to your arm, neck, jaw, back, or abdomen. You have shortness of breath. You have an increasing cough, or you cough up blood. You have severe back or abdominal pain. You feel nauseous or vomit. You have severe weakness. You faint. You have chills. This is an emergency. Do not wait to see if the pain will go away. Get medical help at once. Call your local emergency services (911 in [place] Do not drive yourself to the hospital. MAKE SURE YOU:  Understand these instructions. Will watch your condition. Will get help right away if you are not doing well or get worse. Document Released: 09/14/2005 Document Revised: 12/10/2013 Document Reviewed: 07/10/2008 [facility] Patient Information 2015 [place], [place]. This information is not intended to replace advice given to you by your health care provider. Make sure you discuss any questions you have with your health care provider.

## 2017-08-11 NOTE — ED Notes (Signed)
Pt taken back to triage to be reassissed

## 2017-08-11 NOTE — H&P (Deleted)
Wrong note type

## 2017-08-11 NOTE — Consult Note (Signed)
Patient ID: [name] [name] MRN: [id number], DOB/AGE: 1929/10/22   Admit date: 08/11/2017  Requesting Physician: Primary Physician: Glenford Bayley, DO Primary Cardiologist: Dr. Martinique (2015) Reason for consulation: Chest pain with elevated Troponin  HPI:  [name] is a 83 year old male with history of CAD s/p NSTEMI in March of 2014. Found to have a large abdominal mass at that time. He was cathed and had an occluded LAD with excellent right to left collaterals. EF noted to be 50%. [name] appeared suitable for PCI but given the abdominal mass, medical therapy was favorable. He also has PMH of HTN, HLD, hx of abdominal abscess, hx of anemia, hx of c.diff and anemia.    He was admitted in 2015 with acute respiratory failure that required iintubation. It was felt he had acute diastolic heart failure secondary to colonoscopy prep the preceding week. He was diuresed. Unfortunately this hospital course was complicated by acute hemorrhagic shock caused by a spontaneous retroperitoneal hematoma (after being started on Heparin), a NSTEMI secondary to demand ischemia and acute renal failure. He last saw our office 10/09/2014 for follow-up, at this time we was having some lower extremity edema, wearing compression stockings, and complaining of hoarseness from his intubation. He was supposed to follow-up in 6-8 weeks with Dr. Martinique but he was lost to follow-up.  He since has been complaint with follow-up with his oncologist for his gastrointestinal stromal tumor. [name], NP (oncology) was on 07/11/2017. He continues [place]. He had been feeling well, good appetite, exercising frequently, no cp, n/v/d/rash. His blood pressure at this visit was 174/58, it was not addressed. He is currently stable from an oncology perspective with no evidence of progression of the gastrointestinal stromal tumor. He will continue his [place].  ER COURSE: [name] presented to the ER for evaluation of  constant chest pain that started around 11 am yesterday and has been constant. It is in the center of his chest and described as a pressure, as if he belched it would resolve. He feels better when he gets up to walk around. He has not had any associated fever or chills. He has been given a GI cocktail which completely alleviated his pain. Chest xray shows normal heart size, clear lungs. NA 134, K 3.2, glucose 177, Trop 0.03 << 0.04 >> 0.03. WBC 8.3, H&H -- 11.3 & 33.2. He is currently pain free.   Problem List  Past Medical History:  Diagnosis Date  . Abdominal mass 02/2013  . Acute respiratory failure with hypoxia ([place]) 09/12/2014  . CAD (coronary artery disease)   . CKD (chronic kidney disease), stage III   . GIST (gastrointestinal stroma tumor), malignant, colon ([place])    [name] 03/13/2017  . Hemorrhagic shock ([place]) 09/13/2014  . Hyperlipidemia    TAKES zOCOR DAILY  . Hypertension    TAKES LOTREL,HCTZ,AND METOPROLOL DAILY  . Intraperitoneal hemorrhage 09/17/2014  . NSTEMI (non-ST elevated myocardial infarction) ([place]) 02/2013   "LIGHT: ONE MD SAID HE DID AND ONE SAID HE DIDN'T  . Pneumonia    MAR 2014  . Shortness of breath   . Stromal tumor of digestive system ([place])   . UTI (urinary tract infection)     Past Surgical History:  Procedure Laterality Date  . CARDIAC CATHETERIZATION  03-05-13  . COLONOSCOPY  09/08/2014   dr scooler  . CYST EXCISION  1962   wrist  . IR GENERIC HISTORICAL  05/17/2016   IR RADIOLOGIST  EVAL & MGMT 05/17/2016 [name], MD [facility] INTERV RAD  . IR GENERIC HISTORICAL  07/26/2016   IR RADIOLOGIST EVAL & MGMT 07/26/2016 [name], MD [facility] INTERV RAD  . LAPAROTOMY N/A 06/04/2013   Procedure: EXPLORATORY LAPAROTOMY, OPEN RESECTION OF MESENTERIC AND INTESTINAL MASS;  Surgeon: [name], MD;  Location: [facility];  Service: General;  Laterality: N/A;  Small Bowel Resection  . LEFT HEART CATHETERIZATION WITH CORONARY ANGIOGRAM N/A 03/05/2013   Procedure:  LEFT HEART CATHETERIZATION WITH CORONARY ANGIOGRAM;  Surgeon: [name], MD;  Location[facility] C[facility] LAB;  Service: Cardiovascular;  Laterality: N/A;     Allergies  No Known Allergies  Home Medications  Prior to Admission medications   Medication Sig Start Date End Date Taking? Authorizing Provider  amLODipine (NORVASC) 5 MG tablet Take 5 mg by mouth daily. 03/17/15  Yes [provider]  furosemide (LASIX) 20 MG tablet Take 1 tablet (20 mg total) by mouth daily. 05/20/15  Yes Hosie Poisson, MD  GLEEVEC 400 MG tablet Take 1 tablet (400 mg total) by mouth daily. Take with meals and large glass of water.Caution:Chemotherapy. 05/16/17  Yes Ladell Pier, MD  metoprolol tartrate (LOPRESSOR) 25 MG tablet Take 1 tablet (25 mg total) by mouth 2 (two) times daily. 03/06/13  Yes Caren Griffins, MD  simvastatin (ZOCOR) 20 MG tablet Take 20 mg by mouth every evening.   Yes [provider]  tobramycin-dexamethasone [facility]) ophthalmic solution Place 1 drop into both eyes 4 (four) times daily as needed. For watery eyes 06/26/17  Yes [provider]    Family History  Family History  Problem Relation Age of Onset  . Stroke Mother   . Skin cancer Father   . Skin cancer Brother    Family Status  Relation Status  . Mother Deceased  . Father Deceased  . Brother (Not Specified)     Social History  Social History   Social History  . Marital status: Married    Spouse name: [name]  . Number of children: 2  . Years of education: N/A   Occupational History  . Retired     Writer   Social History Main Topics  . Smoking status: Never Smoker  . Smokeless tobacco: Never Used  . Alcohol use No  . Drug use: No  . Sexual activity: No   Other Topics Concern  . Not on file   Social History Narrative   Married, wife [name]   Retired Environmental manager for 97 years   Independent in [place]   #2 grown sons     Review of Systems General:  No  chills, fever, night sweats or weight changes.  Cardiovascular:  No  dyspnea on exertion, edema, orthopnea, palpitations, paroxysmal nocturnal dyspnea. Dermatological: No rash, lesions/masses Respiratory: No cough, dyspnea Urologic: No hematuria, dysuria Abdominal:   No nausea, vomiting, diarrhea, bright red blood per rectum, melena, or hematemesis Neurologic:  No visual changes, wkns, changes in mental status. All other systems reviewed and are otherwise negative except as noted above.  Physical Exam  Blood pressure (!) 169/72, pulse 92, temperature 98.1 F (36.7 C), temperature source Oral, resp. rate 18, height 5' 7.5" (1.715 m), weight 167 lb (75.8 kg), SpO2 97 %.  General: Pleasant elderly male in NAD Psych: Normal affect. Neuro: Alert and oriented X 3. Moves all extremities spontaneously. HEENT: Normal  Neck: Supple without bruits or JVD. Lungs:  Resp regular and unlabored, CTA. Heart: RRR  no s3, s4, or murmurs. Abdomen: Soft, non-tender, non-distended, BS + x 4.  Extremities: No clubbing, cyanosis or edema. DP/PT/Radials 2+ and equal bilaterally.  Labs   Recent Labs  08/10/17 2335 08/11/17 0330 08/11/17 0906  TROPONINI 0.03* 0.04* 0.03*   Lab Results  Component Value Date   WBC 8.3 08/10/2017   HGB 11.3 (L) 08/10/2017   HCT 33.2 (L) 08/10/2017   MCV 103.4 (H) 08/10/2017   PLT 199 08/10/2017    Recent Labs Lab 08/10/17 2335  NA 134*  K 3.2*  CL 101  CO2 24  BUN 12  CREATININE 1.18  CALCIUM 9.0  GLUCOSE 177*   Lab Results  Component Value Date   TRIG 250 (H) 09/18/2014   No results found for: DDIMER   Radiology/Studies  Dg Chest 2 View  Result Date: 08/11/2017 CLINICAL DATA:  Chest pain for 12 hours. EXAM: CHEST  2 VIEW COMPARISON:  04/03/2017 FINDINGS: Enteric tube has been removed. The cardiomediastinal silhouette is within normal limits. The lungs are well inflated and clear. No pleural effusion or pneumothorax is identified. Any EKG pad projects  over the left upper chest. Thoracic spondylosis is noted. IMPRESSION: No active cardiopulmonary disease. Electronically Signed   By: Logan Bores M.D.   On: 08/11/2017 00:10   ECG  HR 92, sinus rhythm with incomplete RBBB - reviewed personally.  ASSESSMENT AND PLAN  Chest Pain hx of NSTEMI 2014: Pt has a known blockage to LAD which was not amendable to stenting previously because of abdominal tumor. He has had chest pains with both typical and atypical features. He has risk factors of hypertension, hx of hyperlipidemia and family history. His troponin is only mildly elevated, but this appears chronic in nature. -- I recommend the patient can go home with no significant findings on his EKG and negative troponins I think he can follow-up as an outpatient.   OK TO DC from cardiology perspective. Will arrange outpatient follow-up appointment  Hypertension: Hypertension noted in Oncology clinic visits. He is hypertensive here 186/64 to 169/72.  -- Takes Amlodipine 5 mg and Metoprolol 25 mg. --  He says that he has normal BP at home systolic 416S (white coat syndrome?) perhaps we can have him keep a log and adjust accordingly at follow-up appt.  Hyperlipidemia: history of hyperlipidemia, no prior values in EPIC. Takes 20 mg Zocor. -- consider rechecking lipid panel.  GIST (gastrointestinal stromal tumor): Stable from oncology perspective.    Signed, Linus Mako, PA-C 08/11/2017, 11:36 AM  Personally seen and examined. Agree with above. 83 year old male with extensive prior medical history including occluded LAD with extensive collateral blood flow, prior gastrointestinal stromal tumor resected with several postoperative complications including abscess, on chemotherapy with Gleevec here with episode of epigastric discomfort finally relieved with GI cocktail. His son states that when he had a previous symptoms such as this the GI cocktail did not work and he ended up with a small bowel  obstruction. On physical exam he does have bowel sounds present, no high-pitched tinkles, lungs are clear, he appears elderly, heart regular rate and rhythm with 2/6 systolic murmur left lower sternal border.  Atypical chest pain/epigastric pain with minimally elevated troponin, flat, low level  - No evidence of true acute coronary syndrome. EKG does not show any ischemic changes. Personally viewed. He has had previously low level troponin in the past.  - I think that his discomfort was likely GI related and has resolved. He was able to eat a light  lunch without difficulty. Both he and his wife like to go home. I think this is reasonable. His son was present as well. I discussed [name] and his son was aware of his good collateral blood flow. He also reiterated that invasive therapy would not be a good option for him given his prior medical illnesses. I agree. We will let him be discharged and see him back in the office.  Candee Furbish, MD

## 2017-08-11 NOTE — ED Provider Notes (Signed)
Emergency Department Provider Note   I have reviewed the triage vital signs and the nursing notes.   HISTORY  Chief Complaint Chest Pain   HPI [name] [name] is a 83 y.o. male with PMH of CAD, CKD, HLD presents to the ED for evaluation of constant chest pain starting at 11 AM yesterday. The patient describes constant sensation in the center of this chest that feels like he needs to belch. He feels better when he gets up to walk around. No pleuritic pain. No fevers or chills. Patient has past history NSTEMI but no pain with that episode. Follows with Dr. Martinique with Cardiology. No radiation of symptoms.    Past Medical History:  Diagnosis Date  . Abdominal mass 02/2013  . Acute respiratory failure with hypoxia ([place]) 09/12/2014  . CAD (coronary artery disease)   . CKD (chronic kidney disease), stage III   . GIST (gastrointestinal stroma tumor), malignant, colon ([place])    [name] 03/13/2017  . Hemorrhagic shock ([place]) 09/13/2014  . Hyperlipidemia    TAKES zOCOR DAILY  . Hypertension    TAKES LOTREL,HCTZ,AND METOPROLOL DAILY  . Intraperitoneal hemorrhage 09/17/2014  . NSTEMI (non-ST elevated myocardial infarction) ([place]) 02/2013   "LIGHT: ONE MD SAID HE DID AND ONE SAID HE DIDN'T  . Pneumonia    MAR 2014  . Shortness of breath   . Stromal tumor of digestive system ([place])   . UTI (urinary tract infection)     Patient Active Problem List   Diagnosis Date Noted  . Partial small bowel obstruction ([place]) 04/03/2017  . Abscess of abdominal cavity ([place])   . Ascites   . Sepsis ([place]) 07/26/2015  . Abdominal abscess   . C. difficile diarrhea 05/10/2015  . Urinary retention 05/07/2015  . CKD (chronic kidney disease), stage III   . SIRS (systemic inflammatory response syndrome) ([place]) 05/06/2015  . Epigastric pain   . Abdominal pain 01/13/2015  . Abdominal carcinomatosis ([place]) 10/16/2014  . Diastolic CHF, acute on chronic ([facility]) 09/17/2014  . Hyperlipidemia   . CAD (coronary artery disease)    . Hypertension   . GIST (gastrointestinal stromal tumor) of small bowel, malignant ([place]) 06/20/2013  . Acute kidney failure ([place]) 02/28/2013  . NSTEMI (non-ST elevated myocardial infarction) ([place]) 02/27/2013  . Chronic anemia 02/27/2013    Past Surgical History:  Procedure Laterality Date  . CARDIAC CATHETERIZATION  03-05-13  . COLONOSCOPY  09/08/2014   dr scooler  . CYST EXCISION  1962   wrist  . IR GENERIC HISTORICAL  05/17/2016   IR RADIOLOGIST EVAL & MGMT 05/17/2016 [name], MD [facility] INTERV RAD  . IR GENERIC HISTORICAL  07/26/2016   IR RADIOLOGIST EVAL & MGMT 07/26/2016 [name], MD [facility] INTERV RAD  . LAPAROTOMY N/A 06/04/2013   Procedure: EXPLORATORY LAPAROTOMY, OPEN RESECTION OF MESENTERIC AND INTESTINAL MASS;  Surgeon: [name], MD;  Location: [facility];  Service: General;  Laterality: N/A;  Small Bowel Resection  . LEFT HEART CATHETERIZATION WITH CORONARY ANGIOGRAM N/A 03/05/2013   Procedure: LEFT HEART CATHETERIZATION WITH CORONARY ANGIOGRAM;  Surgeon: [name], MD;  Location: [facility] CATH LAB;  Service: Cardiovascular;  Laterality: N/A;    Current Outpatient Rx  . Order #: [id number] Class: Historical Med  . Order #: [id number] Class: Print  . Order #: [id number] Class: Normal  . Order #: [id number] Class: Print  . Order #: [id number] Class: Historical Med  . Order #: [id number] Class: Historical Med    Allergies Patient has no known allergies.  Family History  Problem Relation Age of Onset  . Stroke Mother   . Skin cancer Father   . Skin cancer Brother     Social History Social History  Substance Use Topics  . Smoking status: Never Smoker  . Smokeless tobacco: Never Used  . Alcohol use No    Review of Systems  Constitutional: No fever/chills Eyes: No visual changes. ENT: No sore throat. Cardiovascular: Positive chest pain. Respiratory: Denies shortness of breath. Gastrointestinal: No abdominal pain.  No nausea, no vomiting.  No diarrhea.  No  constipation. Genitourinary: Negative for dysuria. Musculoskeletal: Negative for back pain. Skin: Negative for rash. Neurological: Negative for headaches, focal weakness or numbness.  10-point ROS otherwise negative.  ____________________________________________   PHYSICAL EXAM:  VITAL SIGNS: ED Triage Vitals [08/10/17 2332]  Enc Vitals Group     BP (!) 186/64     Pulse Rate 88     Resp 18     Temp 98.3 F (36.8 C)     Temp Source Oral     SpO2 98 %     Weight 167 lb (75.8 kg)     Height 5' 7.5" (1.715 m)     Pain Score 8   Constitutional: Alert and oriented. Well appearing and in no acute distress. Eyes: Conjunctivae are normal. Head: Atraumatic. Nose: No congestion/rhinnorhea. Mouth/Throat: Mucous membranes are moist.  Oropharynx non-erythematous. Neck: No stridor.  Cardiovascular: Normal rate, regular rhythm. Good peripheral circulation. Grossly normal heart sounds.   Respiratory: Normal respiratory effort.  No retractions. Lungs CTAB. Gastrointestinal: Soft and nontender. No distention.  Musculoskeletal: No lower extremity tenderness nor edema. No gross deformities of extremities. Neurologic:  Normal speech and language. No gross focal neurologic deficits are appreciated.  Skin:  Skin is warm, dry and intact. No rash noted.  ____________________________________________   LABS (all labs ordered are listed, but only abnormal results are displayed)  Labs Reviewed  BASIC METABOLIC PANEL - Abnormal; Notable for the following:       Result Value   Sodium 134 (*)    Potassium 3.2 (*)    Glucose, Bld 177 (*)    GFR calc non Af Amer 54 (*)    All other components within normal limits  CBC - Abnormal; Notable for the following:    RBC 3.21 (*)    Hemoglobin 11.3 (*)    HCT 33.2 (*)    MCV 103.4 (*)    MCH 35.2 (*)    All other components within normal limits  TROPONIN I - Abnormal; Notable for the following:    Troponin I 0.03 (*)    All other components within  normal limits  TROPONIN I - Abnormal; Notable for the following:    Troponin I 0.04 (*)    All other components within normal limits  TROPONIN I - Abnormal; Notable for the following:    Troponin I 0.03 (*)    All other components within normal limits  TROPONIN I   ____________________________________________  EKG   EKG Interpretation  Date/Time:  Friday August 11 2017 03:18:30 EDT Ventricular Rate:  92 PR Interval:  138 QRS Duration: 94 QT Interval:  368 QTC Calculation: 455 R Axis:   17 Text Interpretation:  Normal sinus rhythm Incomplete right bundle branch block Septal infarct , age undetermined Abnormal ECG No STEMI. Similar to prior.  Confirmed by Nanda Quinton [phone]) on 08/11/2017 7:23:42 AM       ____________________________________________  RADIOLOGY  Dg Chest 2 View  Result Date: 08/11/2017 CLINICAL DATA:  Chest pain for 12 hours. EXAM: CHEST  2 VIEW COMPARISON:  04/03/2017 FINDINGS: Enteric tube has been removed. The cardiomediastinal silhouette is within normal limits. The lungs are well inflated and clear. No pleural effusion or pneumothorax is identified. Any EKG pad projects over the left upper chest. Thoracic spondylosis is noted. IMPRESSION: No active cardiopulmonary disease. Electronically Signed   By: Logan Bores M.D.   On: 08/11/2017 00:10    ____________________________________________   PROCEDURES  Procedure(s) performed:   Procedures  None ____________________________________________   INITIAL IMPRESSION / ASSESSMENT AND PLAN / ED COURSE  Pertinent labs & imaging results that were available during my care of the patient were reviewed by me and considered in my medical decision making (see chart for details).  Patient presents to the emergency department for evaluation of constant chest pain for the past 16 hours. No periods of being pain-free. He has history of CAD and is followed by Dr. Martinique. Has been in waiting room with two slightly  elevated troponin that are near prior readings. Plan for 3rd troponin and reassess.   12:55 PM 3 troponins in the ED near baseline. Cardiology has consulted. Appreciate assistance. Offered admission and cath but patient is anxious for discharge and Cardiology will arrange close outpatient follow up.   At this time, I do not feel there is any life-threatening condition present. I have reviewed and discussed all results (EKG, imaging, lab, urine as appropriate), exam findings with patient. I have reviewed nursing notes and appropriate previous records.  I feel the patient is safe to be discharged home without further emergent workup. Discussed usual and customary return precautions. Patient and family (if present) verbalize understanding and are comfortable with this plan.  Patient will follow-up with their primary care provider. If they do not have a primary care provider, information for follow-up has been provided to them. All questions have been answered.  ____________________________________________  FINAL CLINICAL IMPRESSION(S) / ED DIAGNOSES  Final diagnoses:  Precordial chest pain     MEDICATIONS GIVEN DURING THIS VISIT:  Medications  gi cocktail (Maalox,Lidocaine,Donnatal) (30 mLs Oral Given 08/11/17 0752)     NEW OUTPATIENT MEDICATIONS STARTED DURING THIS VISIT:  None   Note:  This document was prepared using Dragon voice recognition software and may include unintentional dictation errors.  Nanda Quinton, MD Emergency Medicine    Long, [facility], MD 08/11/17 [phone]

## 2017-08-17 ENCOUNTER — Other Ambulatory Visit: Payer: Self-pay | Admitting: Student

## 2017-08-17 DIAGNOSIS — K651 Peritoneal abscess: Secondary | ICD-10-CM

## 2017-08-18 ENCOUNTER — Ambulatory Visit
Admission: RE | Admit: 2017-08-18 | Discharge: 2017-08-18 | Disposition: A | Discharge status: Home / Self Care | Payer: Medicare Other | Source: Ambulatory Visit | Attending: Student | Admitting: Student

## 2017-08-18 DIAGNOSIS — K4090 Unilateral inguinal hernia, without obstruction or gangrene, not specified as recurrent: Secondary | ICD-10-CM | POA: Diagnosis not present

## 2017-08-18 DIAGNOSIS — K651 Peritoneal abscess: Secondary | ICD-10-CM

## 2017-08-18 MED ORDER — IOPAMIDOL (ISOVUE-300) INJECTION 61%
100.0000 mL | Freq: Once | INTRAVENOUS | Status: AC | PRN
  Administered 2017-08-18: 100 mL via INTRAVENOUS

## 2017-09-05 ENCOUNTER — Ambulatory Visit (HOSPITAL_BASED_OUTPATIENT_CLINIC_OR_DEPARTMENT_OTHER): Payer: Medicare Other | Admitting: Oncology

## 2017-09-05 ENCOUNTER — Other Ambulatory Visit (HOSPITAL_BASED_OUTPATIENT_CLINIC_OR_DEPARTMENT_OTHER): Payer: Medicare Other

## 2017-09-05 ENCOUNTER — Telehealth: Payer: Self-pay | Admitting: Oncology

## 2017-09-05 VITALS — BP 171/58 | HR 67 | Temp 97.9°F | Resp 16 | Ht 67.5 in | Wt 168.4 lb

## 2017-09-05 DIAGNOSIS — C49A3 Gastrointestinal stromal tumor of small intestine: Secondary | ICD-10-CM

## 2017-09-05 DIAGNOSIS — C762 Malignant neoplasm of abdomen: Secondary | ICD-10-CM

## 2017-09-05 LAB — COMPREHENSIVE METABOLIC PANEL
ALT: 16 U/L (ref 0–55)
AST: 25 U/L (ref 5–34)
Albumin: 3.5 g/dL (ref 3.5–5.0)
Alkaline Phosphatase: 58 U/L (ref 40–150)
Anion Gap: 9 mEq/L (ref 3–11)
BUN: 17 mg/dL (ref 7.0–26.0)
CO2: 26 mEq/L (ref 22–29)
Calcium: 8.7 mg/dL (ref 8.4–10.4)
Chloride: 109 mEq/L (ref 98–109)
Creatinine: 1.2 mg/dL (ref 0.7–1.3)
EGFR: 54 mL/min/{1.73_m2} — ABNORMAL LOW (ref >90)
Glucose: 101 mg/dl (ref 70–140)
Potassium: 3.6 mEq/L (ref 3.5–5.1)
Sodium: 144 mEq/L (ref 136–145)
Total Bilirubin: 0.42 mg/dL (ref 0.20–1.20)
Total Protein: 6.7 g/dL (ref 6.4–8.3)

## 2017-09-05 LAB — CBC WITH DIFFERENTIAL/PLATELET
BASO%: 0.2 % (ref 0.0–2.0)
Basophils Absolute: 0 10*3/uL (ref 0.0–0.1)
EOS%: 2.3 % (ref 0.0–7.0)
Eosinophils Absolute: 0.1 10*3/uL (ref 0.0–0.5)
HCT: 32.1 % — ABNORMAL LOW (ref 38.4–49.9)
HGB: 10.7 g/dL — ABNORMAL LOW (ref 13.0–17.1)
LYMPH%: 18.9 % (ref 14.0–49.0)
MCH: 35.3 pg — ABNORMAL HIGH (ref 27.2–33.4)
MCHC: 33.3 g/dL (ref 32.0–36.0)
MCV: 105.9 fL — ABNORMAL HIGH (ref 79.3–98.0)
MONO#: 0.5 10*3/uL (ref 0.1–0.9)
MONO%: 9.7 % (ref 0.0–14.0)
NEUT#: 3.5 10*3/uL (ref 1.5–6.5)
NEUT%: 68.9 % (ref 39.0–75.0)
Platelets: 176 10*3/uL (ref 140–400)
RBC: 3.03 10*6/uL — ABNORMAL LOW (ref 4.20–5.82)
RDW: 13.5 % (ref 11.0–14.6)
WBC: 5.1 10*3/uL (ref 4.0–10.3)
lymph#: 1 10*3/uL (ref 0.9–3.3)

## 2017-09-05 NOTE — Telephone Encounter (Signed)
Gave avs and calendar for november °

## 2017-09-05 NOTE — Progress Notes (Signed)
[name] OFFICE PROGRESS NOTE   Diagnosis: Gastrointestinal stromal tumor  INTERVAL HISTORY:   [name] [name] returns as scheduled. He continues [place]. He feels well. He has noted mild tearing for the past several months. He saw an ophthalmologist and was prescribed eyedrops. He does not have to white his eyes frequently. The right lower quadrant drain site open and again. He completed 2 weeks of antibiotics prescribed by Dr. [name]. A CT of the abdomen and pelvis 08/18/2017 revealed gallbladder wall thickening with mild pericholecystic edema. 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 Nonobstructing right inguinal hernia The drain site has again closed.   He was seen in the emergency room with chest pain 08/11/2017. He was evaluated by cardiology. He reports resolution of the pain with a GI cocktail. The pain has not recurred.  Objective:  Vital signs in last 24 hours:  Blood pressure (!) 171/58, pulse 67, temperature 97.9 F (36.6 C), temperature source Oral, resp. rate 16, height 5' 7.5" (1.715 m), weight 168 lb 6.4 oz (76.4 kg), SpO2 98 %.    HEENT: Mild conjunctival erythema, no thrush or oral ulcers Resp: Lungs clear bilaterally Cardio: Regular rate and rhythm GI: No hepatosplenomegaly, no mass, nontender, right lower quadrant drain site without drainage or surrounding erythema, right inguinal hernia Vascular: Trace low pretibial edema bilaterally Skin: No rash    Lab Results:  Lab Results  Component Value Date   WBC 5.1 09/05/2017   HGB 10.7 (L) 09/05/2017   HCT 32.1 (L) 09/05/2017   MCV 105.9 (H) 09/05/2017   PLT 176 09/05/2017   NEUTROABS 3.5 09/05/2017    CMP     Component Value Date/Time   NA 144 09/05/2017 0942   K 3.6 09/05/2017 0942   CL 101 08/10/2017 2335   CO2 26 09/05/2017 0942   GLUCOSE 101 09/05/2017 0942   BUN 17.0 09/05/2017 0942   CREATININE 1.2 09/05/2017 0942   CALCIUM 8.7 09/05/2017 0942   PROT 6.7 09/05/2017 0942   ALBUMIN 3.5 09/05/2017 0942   AST 25 09/05/2017 0942   ALT 16 09/05/2017 0942   ALKPHOS 58 09/05/2017 0942   BILITOT 0.42 09/05/2017 0942   GFRNONAA 54 (L) 08/10/2017 2335   GFRAA >60 08/10/2017 2335     Medications: I have reviewed the patient's current medications.  Assessment/Plan: 1. 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  CT 10/16/2014 consistent with extensive carcinomatosis, CT-guided biopsy of an omental mass 10/21/2014 confirmed a gastrointestinal stromal tumor  Initiation of Gleevec 10/31/2014  CT 01/13/2015 revealed improvement in the peritoneal and omental metastatic disease  CT 05/05/2015 with no progression of omental/peritoneal metastatic disease, increased ascites, and appendix inflammation  CT 07/07/2016-no abscess, mild perihepatic and left pericolic ascites and mesenteric edema  CT of the pelvis 09/20/2016, no residual abscess, no ascites  CT abdomen pelvis renal stones study 04/03/2017-partial small bowel obstruction, potentially related to right inguinal hernia  CT abdomen/pelvis 08/18/2017-possible cholecystitis, 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 History of anemia, status post a nondiagnostic bone marrow biopsy 10/21/2014  3. NSTEMI March 2014  4. Admission 05/06/2015 with acute onset right abdomen pain-potentially related to acute appendicitis versus pain from carcinomatosis  CT 05/13/2015 consistent with a right lower abdomen abscess, status post catheter drainage by interventional radiology 05/13/2015  Follow-up CT 05/19/2015 showed resolution of the abscess.  Follow-up evaluation in interventional radiology 06/01/2015 showed the abscess cavity had resolved. The abscess drainage catheter communicated with  the cecum via the appendix. The catheter was left in place.  CT abdomen/pelvis 06/08/2015 showed the right pelvic drain in place without  recurrent or residual surrounding fluid collection. Peritoneal metastasis with slight increase in small to moderate volume of abdominal pelvic ascites.  Drainage catheter injection 06/12/2015 showed residual tiny fistulous connection with the end of the decompressed abscess cavity within the right lower abdominal quadrant and the residual lumen of the appendix.  Paracentesis 06/12/2015 with 5 liters of fluid removed  Paracentesis 06/19/2015-negative cytology and culture  Removal of abscess drainage catheter 06/23/2015  CT 07/26/2015 with increased right abdominal wall inflammation and increased fluid collection at site of previous right abdomen abscess, ascites, and enlargement/inflammation of the appendix  Placement of right lower quadrant percutaneous drainage catheter 07/27/2015. Contrast injection confirmed persistent fistulous connection with the ill-defined fluid collection and the residual appendix and cecum. Paracentesis with 2 L of fluid removed.  Follow-up CT abdomen/pelvis 08/05/2015 with small to moderate left pleural effusion and moderate to large volume ascites with both appearing slightly decreased since the prior study. Right lower quadrant drainage catheter in place. 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 Drainage catheter left in place.  Tube evaluation 02/10/2016, persistent fistula  Tube evaluation 03/01/2016, continued fistula to the cecum  Tube downsized 05/04/2016, no residual abscess seen  Collapsed abscess with a stable fistula to the cecum on imaging 06/16/2016  Tube removed 07/26/2016  Recurrent drainage from the right lower quadrant tube site August 2018, status post antibiotics with improvement   5. C. difficile colitis 05/10/2015  6. Right leg edema 12/18/2015. Negative venous Doppler.    Disposition:  Mr.  [name] [name] appears stable. There is no clinical evidence for progression of the gastrointestinal stromal tumor and a CT 08/18/2017 revealed no disease progression.  The chest discomfort he experienced last month may have been related to gallbladder information versus reflux. The discomfort has resolved.  He will continue Gleevec. The tearing is likely related to [employer] therapy.  [name] [name] will return for an office visit in 2 months.  Donneta Romberg, MD  09/05/2017  10:41 AM

## 2017-09-06 MED FILL — GLEEVEC 400 MG TABLET: 400 | 30 days supply | Qty: 30 | Fill #3

## 2017-09-12 ENCOUNTER — Ambulatory Visit: Payer: Medicare Other | Admitting: Nurse Practitioner

## 2017-09-28 DIAGNOSIS — Z23 Encounter for immunization: Secondary | ICD-10-CM | POA: Diagnosis not present

## 2017-10-02 MED FILL — GLEEVEC 400 MG TABLET: 400 | 30 days supply | Qty: 30 | Fill #4

## 2017-11-03 DIAGNOSIS — I2510 Atherosclerotic heart disease of native coronary artery without angina pectoris: Secondary | ICD-10-CM | POA: Diagnosis not present

## 2017-11-03 DIAGNOSIS — K432 Incisional hernia without obstruction or gangrene: Secondary | ICD-10-CM | POA: Diagnosis not present

## 2017-11-03 DIAGNOSIS — Z8509 Personal history of malignant neoplasm of other digestive organs: Secondary | ICD-10-CM | POA: Diagnosis not present

## 2017-11-03 DIAGNOSIS — K651 Peritoneal abscess: Secondary | ICD-10-CM | POA: Diagnosis not present

## 2017-11-03 DIAGNOSIS — K4090 Unilateral inguinal hernia, without obstruction or gangrene, not specified as recurrent: Secondary | ICD-10-CM | POA: Diagnosis not present

## 2017-11-03 DIAGNOSIS — Z8619 Personal history of other infectious and parasitic diseases: Secondary | ICD-10-CM | POA: Diagnosis not present

## 2017-11-03 DIAGNOSIS — R188 Other ascites: Secondary | ICD-10-CM | POA: Diagnosis not present

## 2017-11-06 ENCOUNTER — Other Ambulatory Visit (HOSPITAL_BASED_OUTPATIENT_CLINIC_OR_DEPARTMENT_OTHER): Payer: Medicare Other

## 2017-11-06 ENCOUNTER — Telehealth: Payer: Self-pay | Admitting: Oncology

## 2017-11-06 ENCOUNTER — Ambulatory Visit (HOSPITAL_BASED_OUTPATIENT_CLINIC_OR_DEPARTMENT_OTHER): Payer: Medicare Other | Admitting: Oncology

## 2017-11-06 VITALS — BP 162/48 | HR 98 | Temp 97.9°F | Resp 18 | Ht 67.5 in | Wt 168.6 lb

## 2017-11-06 DIAGNOSIS — C49A3 Gastrointestinal stromal tumor of small intestine: Secondary | ICD-10-CM

## 2017-11-06 LAB — COMPREHENSIVE METABOLIC PANEL
ALBUMIN: 3.4 g/dL — AB (ref 3.5–5.0)
ALK PHOS: 48 U/L (ref 40–150)
ALT: 13 U/L (ref 0–55)
AST: 18 U/L (ref 5–34)
Anion Gap: 6 mEq/L (ref 3–11)
BUN: 24.8 mg/dL (ref 7.0–26.0)
CALCIUM: 8.4 mg/dL (ref 8.4–10.4)
CHLORIDE: 107 meq/L (ref 98–109)
CO2: 28 mEq/L (ref 22–29)
Creatinine: 1.6 mg/dL — ABNORMAL HIGH (ref 0.7–1.3)
EGFR: 38 mL/min/{1.73_m2} — AB (ref >60)
Glucose: 103 mg/dl (ref 70–140)
POTASSIUM: 3.9 meq/L (ref 3.5–5.1)
SODIUM: 141 meq/L (ref 136–145)
Total Bilirubin: 0.39 mg/dL (ref 0.20–1.20)
Total Protein: 6.3 g/dL — ABNORMAL LOW (ref 6.4–8.3)

## 2017-11-06 LAB — CBC WITH DIFFERENTIAL/PLATELET
BASO%: 0.3 % (ref 0.0–2.0)
BASOS ABS: 0 10*3/uL (ref 0.0–0.1)
EOS ABS: 0.1 10*3/uL (ref 0.0–0.5)
EOS%: 1.9 % (ref 0.0–7.0)
HCT: 32.6 % — ABNORMAL LOW (ref 38.4–49.9)
HGB: 11.2 g/dL — ABNORMAL LOW (ref 13.0–17.1)
LYMPH%: 19.6 % (ref 14.0–49.0)
MCH: 36.3 pg — AB (ref 27.2–33.4)
MCHC: 34.2 g/dL (ref 32.0–36.0)
MCV: 105.9 fL — AB (ref 79.3–98.0)
MONO#: 0.5 10*3/uL (ref 0.1–0.9)
MONO%: 10.5 % (ref 0.0–14.0)
NEUT#: 3.1 10*3/uL (ref 1.5–6.5)
NEUT%: 67.7 % (ref 39.0–75.0)
Platelets: 196 10*3/uL (ref 140–400)
RBC: 3.08 10*6/uL — AB (ref 4.20–5.82)
RDW: 12.9 % (ref 11.0–14.6)
WBC: 4.6 10*3/uL (ref 4.0–10.3)
lymph#: 0.9 10*3/uL (ref 0.9–3.3)

## 2017-11-06 MED FILL — GLEEVEC 400 MG TABLET: 400 | 30 days supply | Qty: 30 | Fill #5

## 2017-11-06 NOTE — Telephone Encounter (Signed)
Scheduled appt per 11/19 los - Gave patient AVS and calender per los.  

## 2017-11-06 NOTE — Progress Notes (Signed)
[place] OFFICE PROGRESS NOTE   Diagnosis: Gastrointestinal stromal tumor  INTERVAL HISTORY:   [name] [name] continues [place].  He feels well.  No diarrhea.  Good appetite.  The right abdominal wound is again draining.  He saw Dr. [name] last week and was prescribed an antibiotic.  Objective:  Vital signs in last 24 hours:  Blood pressure (!) 162/48, pulse 98, temperature 97.9 F (36.6 C), temperature source Oral, resp. rate 18, height 5' 7.5" (1.715 m), weight 168 lb 9.6 oz (76.5 kg), SpO2 100 %.    HEENT: No thrush or ulcers Resp: Lungs clear bilaterally Cardio: Regular rate and rhythm GI: No hepatomegaly, no mass, nontender, small amount of drainage from the wound at the right lower abdomen Vascular: The right lower leg is larger than the left side, no edema  Skin: No rash    Lab Results:  Lab Results  Component Value Date   WBC 4.6 11/06/2017   HGB 11.2 (L) 11/06/2017   HCT 32.6 (L) 11/06/2017   MCV 105.9 (H) 11/06/2017   PLT 196 11/06/2017   NEUTROABS 3.1 11/06/2017    CMP     Component Value Date/Time   NA 141 11/06/2017 0845   K 3.9 11/06/2017 0845   CL 101 08/10/2017 2335   CO2 28 11/06/2017 0845   GLUCOSE 103 11/06/2017 0845   BUN 24.8 11/06/2017 0845   CREATININE 1.6 (H) 11/06/2017 0845   CALCIUM 8.4 11/06/2017 0845   PROT 6.3 (L) 11/06/2017 0845   ALBUMIN 3.4 (L) 11/06/2017 0845   AST 18 11/06/2017 0845   ALT 13 11/06/2017 0845   ALKPHOS 48 11/06/2017 0845   BILITOT 0.39 11/06/2017 0845   GFRNONAA 54 (L) 08/10/2017 2335   GFRAA >60 08/10/2017 2335     Medications: I have reviewed the patient's current medications.  Assessment/Plan: 1. 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  CT 10/16/2014 consistent with extensive carcinomatosis, CT-guided biopsy of an omental mass 10/21/2014 confirmed a gastrointestinal stromal tumor  Initiation of Gleevec 10/31/2014  CT 01/13/2015 revealed  improvement in the peritoneal and omental metastatic disease  CT 05/05/2015 with no progression of omental/peritoneal metastatic disease, increased ascites, and appendix inflammation  CT 07/07/2016-no abscess, mild perihepatic and left pericolic ascites and mesenteric edema  CT of the pelvis 09/20/2016, no residual abscess, no ascites  CT abdomen pelvis renal stones study 04/03/2017-partial small bowel obstruction, potentially related to right inguinal hernia  CT abdomen/pelvis 08/18/2017-possible cholecystitis, 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 History of anemia, status post a nondiagnostic bone marrow biopsy 10/21/2014  3. NSTEMI March 2014  4. Admission 05/06/2015 with acute onset right abdomen pain-potentially related to acute appendicitis versus pain from carcinomatosis  CT 05/13/2015 consistent with a right lower abdomen abscess, status post catheter drainage by interventional radiology 05/13/2015  Follow-up CT 05/19/2015 showed resolution of the abscess.  Follow-up evaluation in interventional radiology 06/01/2015 showed the abscess cavity had resolved. The abscess drainage catheter communicated with the cecum via the appendix. The catheter was left in place.  CT abdomen/pelvis 06/08/2015 showed the right pelvic drain in place without recurrent or residual surrounding fluid collection. Peritoneal metastasis with slight increase in small to moderate volume of abdominal pelvic ascites.  Drainage catheter injection 06/12/2015 showed residual tiny fistulous connection with the end of the decompressed abscess cavity within the right lower abdominal quadrant and the residual lumen of the appendix.  Paracentesis 06/12/2015 with 5 liters of fluid removed  Paracentesis 06/19/2015-negative  cytology and culture  Removal of abscess drainage catheter 06/23/2015  CT 07/26/2015 with increased right abdominal wall inflammation and increased fluid collection at site  of previous right abdomen abscess, ascites, and enlargement/inflammation of the appendix  Placement of right lower quadrant percutaneous drainage catheter 07/27/2015. Contrast injection confirmed persistent fistulous connection with the ill-defined fluid collection and the residual appendix and cecum. Paracentesis with 2 L of fluid removed.  Follow-up CT abdomen/pelvis 08/05/2015 with small to moderate left pleural effusion and moderate to large volume ascites with both appearing slightly decreased since the prior study. Right lower quadrant drainage catheter in place. 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 Drainage catheter left in place.  Tube evaluation 02/10/2016, persistent fistula  Tube evaluation 03/01/2016, continued fistula to the cecum  Tube downsized 05/04/2016, no residual abscess seen  Collapsed abscess with a stable fistula to the cecum on imaging 06/16/2016  Tube removed 07/26/2016  Recurrent drainage from the right lower quadrant tube site August 2018, status post antibiotics with improvement, drainage again November 2018-antibiotic prescribed by Dr. [name]   5. C. difficile colitis 05/10/2015  6. Right leg edema 12/18/2015. Negative venous Doppler.    Disposition:  [name] [name] appears unchanged.  He will continue antibiotics as prescribed by Dr. [name] for the draining right lower quadrant fistula site.  He otherwise feels well.  He will continue Gleevec. The creatinine is mildly elevated today, but has been in this range in the past.  [name] [name] will return for an office and lab visit in 3 months.  15 minutes were spent with the patient today.  The majority of the time was used for counseling and coordination of care.  Betsy Coder, MD  11/06/2017  10:45 AM

## 2017-11-14 ENCOUNTER — Telehealth: Payer: Self-pay | Admitting: Pharmacy Technician

## 2017-11-14 NOTE — Telephone Encounter (Signed)
Oral Oncology Patient Advocate Encounter  Received notification from [facility] that prior authorization renewal for [place] is required.  PA submitted via fax [phone]) Status is pending  Oral Oncology Clinic will continue to follow.  Fabio Asa. Melynda Keller, [place] Patient [place] [phone] 11/14/2017 2:46 PM

## 2017-11-17 NOTE — Telephone Encounter (Signed)
Oral Oncology Patient Advocate Encounter  Prior Authorization for brand Gleevec has been approved.    Effective dates: 11/16/2017 through 12/18/2018.  Fabio Asa. Melynda Keller, [place] Patient [place] [phone] 11/17/2017 10:20 AM

## 2017-12-01 MED FILL — GLEEVEC 400 MG TABLET: 400 | 30 days supply | Qty: 30 | Fill #0

## 2017-12-25 ENCOUNTER — Other Ambulatory Visit: Payer: Self-pay | Admitting: Oncology

## 2018-01-02 MED FILL — GLEEVEC 400 MG TABLET: 400 | 30 days supply | Qty: 30 | Fill #0

## 2018-01-22 ENCOUNTER — Inpatient Hospital Stay: Payer: Medicare Other

## 2018-01-22 ENCOUNTER — Inpatient Hospital Stay: Payer: Medicare Other | Attending: Oncology | Admitting: Oncology

## 2018-01-22 ENCOUNTER — Telehealth: Payer: Self-pay | Admitting: Oncology

## 2018-01-22 VITALS — BP 167/52 | HR 70 | Temp 98.0°F | Resp 18 | Ht 67.5 in | Wt 170.6 lb

## 2018-01-22 DIAGNOSIS — I252 Old myocardial infarction: Secondary | ICD-10-CM | POA: Diagnosis not present

## 2018-01-22 DIAGNOSIS — Z79899 Other long term (current) drug therapy: Secondary | ICD-10-CM | POA: Diagnosis not present

## 2018-01-22 DIAGNOSIS — C786 Secondary malignant neoplasm of retroperitoneum and peritoneum: Secondary | ICD-10-CM | POA: Diagnosis not present

## 2018-01-22 DIAGNOSIS — C49A3 Gastrointestinal stromal tumor of small intestine: Secondary | ICD-10-CM | POA: Insufficient documentation

## 2018-01-22 LAB — COMPREHENSIVE METABOLIC PANEL
ALT: 12 U/L (ref 0–55)
AST: 19 U/L (ref 5–34)
Albumin: 3.6 g/dL (ref 3.5–5.0)
Alkaline Phosphatase: 54 U/L (ref 40–150)
Anion gap: 7 (ref 3–11)
BUN: 19 mg/dL (ref 7–26)
CHLORIDE: 108 mmol/L (ref 98–109)
CO2: 29 mmol/L (ref 22–29)
Calcium: 8.6 mg/dL (ref 8.4–10.4)
Creatinine, Ser: 1.35 mg/dL — ABNORMAL HIGH (ref 0.70–1.30)
GFR, EST AFRICAN AMERICAN: 53 mL/min — AB (ref >60)
GFR, EST NON AFRICAN AMERICAN: 46 mL/min — AB (ref >60)
Glucose, Bld: 95 mg/dL (ref 70–140)
Potassium: 4 mmol/L (ref 3.5–5.1)
Sodium: 144 mmol/L (ref 136–145)
Total Bilirubin: 0.4 mg/dL (ref 0.2–1.2)
Total Protein: 6.4 g/dL (ref 6.4–8.3)

## 2018-01-22 LAB — CBC WITH DIFFERENTIAL/PLATELET
Basophils Absolute: 0 10*3/uL (ref 0.0–0.1)
Basophils Relative: 0 %
Eosinophils Absolute: 0.1 10*3/uL (ref 0.0–0.5)
Eosinophils Relative: 3 %
HCT: 33.7 % — ABNORMAL LOW (ref 38.4–49.9)
HEMOGLOBIN: 11.1 g/dL — AB (ref 13.0–17.1)
LYMPHS ABS: 1.1 10*3/uL (ref 0.9–3.3)
Lymphocytes Relative: 23 %
MCH: 35.2 pg — AB (ref 27.2–33.4)
MCHC: 32.9 g/dL (ref 32.0–36.0)
MCV: 107 fL — ABNORMAL HIGH (ref 79.3–98.0)
Monocytes Absolute: 0.4 10*3/uL (ref 0.1–0.9)
Monocytes Relative: 9 %
NEUTROS ABS: 3.1 10*3/uL (ref 1.5–6.5)
NEUTROS PCT: 65 %
Platelets: 202 10*3/uL (ref 140–400)
RBC: 3.15 MIL/uL — AB (ref 4.20–5.82)
RDW: 13.4 % (ref 11.0–14.6)
WBC: 4.7 10*3/uL (ref 4.0–10.3)

## 2018-01-22 NOTE — Telephone Encounter (Signed)
Scheduled appt per 2/4 los - Gave patient AVS and calender per los.  

## 2018-01-22 NOTE — Progress Notes (Signed)
[facility] OFFICE PROGRESS NOTE   Diagnosis: Gastrointestinal stromal tumor  INTERVAL HISTORY:   [name] [name] returns as scheduled.  He continues [place].  No diarrhea.  No abdominal pain.  Good appetite and energy level.  The right lower quadrant wound has healed.  Objective:  Vital signs in last 24 hours:  Blood pressure (!) 167/52, pulse 70, temperature 98 F (36.7 C), temperature source Oral, resp. rate 18, height 5' 7.5" (1.715 m), weight 170 lb 9.6 oz (77.4 kg), SpO2 99 %.    HEENT: No thrush or ulcers Resp: Lungs clear bilaterally Cardio: Regular rate and rhythm GI: No hepatomegaly, no mass, nontender, right inguinal hernia Vascular: No leg edema Skin: Few erythematous papules over the chest   Lab Results:  Lab Results  Component Value Date   WBC 4.7 01/22/2018   HGB 11.1 (L) 01/22/2018   HCT 33.7 (L) 01/22/2018   MCV 107.0 (H) 01/22/2018   PLT 202 01/22/2018   NEUTROABS 3.1 01/22/2018    CMP     Component Value Date/Time   NA 144 01/22/2018 0916   NA 141 11/06/2017 0845   K 4.0 01/22/2018 0916   K 3.9 11/06/2017 0845   CL 108 01/22/2018 0916   CO2 29 01/22/2018 0916   CO2 28 11/06/2017 0845   GLUCOSE 95 01/22/2018 0916   GLUCOSE 103 11/06/2017 0845   BUN 19 01/22/2018 0916   BUN 24.8 11/06/2017 0845   CREATININE 1.35 (H) 01/22/2018 0916   CREATININE 1.6 (H) 11/06/2017 0845   CALCIUM 8.6 01/22/2018 0916   CALCIUM 8.4 11/06/2017 0845   PROT 6.4 01/22/2018 0916   PROT 6.3 (L) 11/06/2017 0845   ALBUMIN 3.6 01/22/2018 0916   ALBUMIN 3.4 (L) 11/06/2017 0845   AST 19 01/22/2018 0916   AST 18 11/06/2017 0845   ALT 12 01/22/2018 0916   ALT 13 11/06/2017 0845   ALKPHOS 54 01/22/2018 0916   ALKPHOS 48 11/06/2017 0845   BILITOT 0.4 01/22/2018 0916   BILITOT 0.39 11/06/2017 0845   GFRNONAA 46 (L) 01/22/2018 0916   GFRAA 53 (L) 01/22/2018 0916   Medications: I have reviewed the patient's current medications.   Assessment/Plan: 1.  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  CT 10/16/2014 consistent with extensive carcinomatosis, CT-guided biopsy of an omental mass 10/21/2014 confirmed a gastrointestinal stromal tumor  Initiation of Gleevec 10/31/2014  CT 01/13/2015 revealed improvement in the peritoneal and omental metastatic disease  CT 05/05/2015 with no progression of omental/peritoneal metastatic disease, increased ascites, and appendix inflammation  CT 07/07/2016-no abscess, mild perihepatic and left pericolic ascites and mesenteric edema  CT of the pelvis 09/20/2016, no residual abscess, no ascites  CT abdomen pelvis renal stones study 04/03/2017-partial small bowel obstruction, potentially related to right inguinal hernia  CT abdomen/pelvis 08/18/2017-possible cholecystitis, 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 History of anemia, status post a nondiagnostic bone marrow biopsy 10/21/2014  3. NSTEMI March 2014  4. Admission 05/06/2015 with acute onset right abdomen pain-potentially related to acute appendicitis versus pain from carcinomatosis  CT 05/13/2015 consistent with a right lower abdomen abscess, status post catheter drainage by interventional radiology 05/13/2015  Follow-up CT 05/19/2015 showed resolution of the abscess.  Follow-up evaluation in interventional radiology 06/01/2015 showed the abscess cavity had resolved. The abscess drainage catheter communicated with the cecum via the appendix. The catheter was left in place.  CT abdomen/pelvis 06/08/2015 showed the right pelvic drain in place without recurrent or residual surrounding  fluid collection. Peritoneal metastasis with slight increase in small to moderate volume of abdominal pelvic ascites.  Drainage catheter injection 06/12/2015 showed residual tiny fistulous connection with the end of the decompressed abscess cavity within the right lower abdominal quadrant and the  residual lumen of the appendix.  Paracentesis 06/12/2015 with 5 liters of fluid removed  Paracentesis 06/19/2015-negative cytology and culture  Removal of abscess drainage catheter 06/23/2015  CT 07/26/2015 with increased right abdominal wall inflammation and increased fluid collection at site of previous right abdomen abscess, ascites, and enlargement/inflammation of the appendix  Placement of right lower quadrant percutaneous drainage catheter 07/27/2015. Contrast injection confirmed persistent fistulous connection with the ill-defined fluid collection and the residual appendix and cecum. Paracentesis with 2 L of fluid removed.  Follow-up CT abdomen/pelvis 08/05/2015 with small to moderate left pleural effusion and moderate to large volume ascites with both appearing slightly decreased since the prior study. Right lower quadrant drainage catheter in place. 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 Drainage catheter left in place.  Tube evaluation 02/10/2016, persistent fistula  Tube evaluation 03/01/2016, continued fistula to the cecum  Tube downsized 05/04/2016, no residual abscess seen  Collapsed abscess with a stable fistula to the cecum on imaging 06/16/2016  Tube removed 07/26/2016  Recurrent drainage from the right lower quadrant tube site August 2018, status post antibiotics with improvement, drainage again November 2018-antibiotic prescribed by Dr. [name]   5. C. difficile colitis 05/10/2015  6. Right leg edema 12/18/2015. Negative venous Doppler.   Disposition: [name] appears unchanged.  No evidence for progression of the gastrointestinal stromal tumor.  He will continue Gleevec.  He will return for an office and lab visit in 3 months.  He will contact us in the interim for new symptoms.  15 minutes were spent with the patient  today.  The majority of the time was used for counseling and coordination of care.  Betsy Coder, MD  01/22/2018  3:30 PM

## 2018-01-23 ENCOUNTER — Other Ambulatory Visit: Payer: Self-pay | Admitting: Pharmacist

## 2018-02-01 DIAGNOSIS — E782 Mixed hyperlipidemia: Secondary | ICD-10-CM | POA: Diagnosis not present

## 2018-02-01 DIAGNOSIS — N183 Chronic kidney disease, stage 3 (moderate): Secondary | ICD-10-CM | POA: Diagnosis not present

## 2018-02-01 DIAGNOSIS — D509 Iron deficiency anemia, unspecified: Secondary | ICD-10-CM | POA: Diagnosis not present

## 2018-02-01 DIAGNOSIS — I10 Essential (primary) hypertension: Secondary | ICD-10-CM | POA: Diagnosis not present

## 2018-02-01 DIAGNOSIS — R630 Anorexia: Secondary | ICD-10-CM | POA: Diagnosis not present

## 2018-02-01 DIAGNOSIS — I2510 Atherosclerotic heart disease of native coronary artery without angina pectoris: Secondary | ICD-10-CM | POA: Diagnosis not present

## 2018-02-02 MED FILL — GLEEVEC 400 MG TABLET: 400 | 30 days supply | Qty: 30 | Fill #1

## 2018-03-02 MED FILL — GLEEVEC 400 MG TABLET: 400 | 30 days supply | Qty: 30 | Fill #2

## 2018-04-02 ENCOUNTER — Telehealth: Payer: Self-pay | Admitting: Pharmacy Technician

## 2018-04-02 MED FILL — GLEEVEC 400 MG TABLET: 400 | 30 days supply | Qty: 30 | Fill #3

## 2018-04-02 NOTE — Telephone Encounter (Signed)
Oral Oncology [name] Encounter  Received notification from [facility] that they were having difficulty processing the [name]'s copayment assistance for [employer].  I contacted [name] [name]. ([employer]) to obtain some clarification as to why the grant wasn't providing coverage for his medication.  [name] [name] still has [phone] of available funding remaining on his current grant.  [employer] is going to reach out to the technician at the pharmacy [phone]) to assist them in troubleshooting and resolving the issue.   I will continue to follow up with the pharmacy to ensure that the issue has been resolved.    Fabio Asa. Melynda Keller, [name] [phone] 04/02/2018 10:43 AM

## 2018-04-23 ENCOUNTER — Encounter: Payer: Self-pay | Admitting: Nurse Practitioner

## 2018-04-23 ENCOUNTER — Inpatient Hospital Stay: Payer: Medicare Other

## 2018-04-23 ENCOUNTER — Telehealth: Payer: Self-pay | Admitting: Nurse Practitioner

## 2018-04-23 ENCOUNTER — Inpatient Hospital Stay: Payer: Medicare Other | Attending: Oncology | Admitting: Nurse Practitioner

## 2018-04-23 VITALS — BP 158/46 | HR 63 | Temp 98.1°F | Resp 18 | Ht 67.5 in | Wt 170.7 lb

## 2018-04-23 DIAGNOSIS — L539 Erythematous condition, unspecified: Secondary | ICD-10-CM | POA: Insufficient documentation

## 2018-04-23 DIAGNOSIS — C49A3 Gastrointestinal stromal tumor of small intestine: Secondary | ICD-10-CM

## 2018-04-23 DIAGNOSIS — Z79899 Other long term (current) drug therapy: Secondary | ICD-10-CM | POA: Insufficient documentation

## 2018-04-23 DIAGNOSIS — I252 Old myocardial infarction: Secondary | ICD-10-CM | POA: Insufficient documentation

## 2018-04-23 DIAGNOSIS — R238 Other skin changes: Secondary | ICD-10-CM | POA: Insufficient documentation

## 2018-04-23 DIAGNOSIS — C786 Secondary malignant neoplasm of retroperitoneum and peritoneum: Secondary | ICD-10-CM | POA: Insufficient documentation

## 2018-04-23 LAB — CBC WITH DIFFERENTIAL (CANCER CENTER ONLY)
BASOS ABS: 0 10*3/uL (ref 0.0–0.1)
Basophils Relative: 0 %
Eosinophils Absolute: 0.1 10*3/uL (ref 0.0–0.5)
Eosinophils Relative: 2 %
HEMATOCRIT: 32.1 % — AB (ref 38.4–49.9)
Hemoglobin: 10.9 g/dL — ABNORMAL LOW (ref 13.0–17.1)
LYMPHS PCT: 13 %
Lymphs Abs: 0.8 10*3/uL — ABNORMAL LOW (ref 0.9–3.3)
MCH: 35.9 pg — ABNORMAL HIGH (ref 27.2–33.4)
MCHC: 33.9 g/dL (ref 32.0–36.0)
MCV: 105.9 fL — AB (ref 79.3–98.0)
MONO ABS: 0.7 10*3/uL (ref 0.1–0.9)
Monocytes Relative: 12 %
NEUTROS ABS: 4.2 10*3/uL (ref 1.5–6.5)
NEUTROS PCT: 73 %
Platelet Count: 181 10*3/uL (ref 140–400)
RBC: 3.03 MIL/uL — AB (ref 4.20–5.82)
RDW: 12.9 % (ref 11.0–14.6)
WBC: 5.7 10*3/uL (ref 4.0–10.3)

## 2018-04-23 LAB — CMP (CANCER CENTER ONLY)
ALBUMIN: 3.7 g/dL (ref 3.5–5.0)
ALK PHOS: 55 U/L (ref 40–150)
ALT: 15 U/L (ref 0–55)
ANION GAP: 4 (ref 3–11)
AST: 21 U/L (ref 5–34)
BILIRUBIN TOTAL: 0.4 mg/dL (ref 0.2–1.2)
BUN: 24 mg/dL (ref 7–26)
CALCIUM: 9.2 mg/dL (ref 8.4–10.4)
CO2: 29 mmol/L (ref 22–29)
Chloride: 109 mmol/L (ref 98–109)
Creatinine: 1.39 mg/dL — ABNORMAL HIGH (ref 0.70–1.30)
GFR, EST AFRICAN AMERICAN: 51 mL/min — AB (ref >60)
GFR, EST NON AFRICAN AMERICAN: 44 mL/min — AB (ref >60)
GLUCOSE: 98 mg/dL (ref 70–140)
POTASSIUM: 4.6 mmol/L (ref 3.5–5.1)
Sodium: 142 mmol/L (ref 136–145)
TOTAL PROTEIN: 6.5 g/dL (ref 6.4–8.3)

## 2018-04-23 NOTE — Telephone Encounter (Signed)
Scheduled appt per 5/6 los - Gave pt AVS and calender per los.

## 2018-04-23 NOTE — Progress Notes (Signed)
[facility] OFFICE PROGRESS NOTE   Diagnosis: Gastrointestinal stromal tumor  INTERVAL HISTORY:   [name] [name] returns as scheduled.  He continues [place].  He denies nausea/vomiting.  No mouth sores.  No diarrhea. No rash.  He denies shortness of breath.  No fever.  Right lower quadrant wound continues to be healed.  Objective:  Vital signs in last 24 hours:  Blood pressure (!) 158/46, pulse 63, temperature 98.1 F (36.7 C), temperature source Oral, resp. rate 18, height 5' 7.5" (1.715 m), weight 170 lb 11.2 oz (77.4 kg), SpO2 100 %.    HEENT: No thrush or ulcers. Resp: Lungs clear bilaterally. Cardio: Regular rate and rhythm. GI: No hepatosplenomegaly.  No mass.  Nontender. Vascular: Trace bilateral pretibial edema.  Skin: Few erythematous papules scattered over the trunk.   Lab Results:  Lab Results  Component Value Date   WBC 5.7 04/23/2018   HGB 10.9 (L) 04/23/2018   HCT 32.1 (L) 04/23/2018   MCV 105.9 (H) 04/23/2018   PLT 181 04/23/2018   NEUTROABS 4.2 04/23/2018    Imaging:  No results found.  Medications: I have reviewed the patient's current medications.  Assessment/Plan: 1. 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  CT 10/16/2014 consistent with extensive carcinomatosis, CT-guided biopsy of an omental mass 10/21/2014 confirmed a gastrointestinal stromal tumor  Initiation of Gleevec 10/31/2014  CT 01/13/2015 revealed improvement in the peritoneal and omental metastatic disease  CT 05/05/2015 with no progression of omental/peritoneal metastatic disease, increased ascites, and appendix inflammation  CT 07/07/2016-no abscess, mild perihepatic and left pericolic ascites and mesenteric edema  CT of the pelvis 09/20/2016, no residual abscess, no ascites  CT abdomen pelvis renal stones study 04/03/2017-partial small bowel obstruction, potentially related to right inguinal hernia  CT abdomen/pelvis  08/18/2017-possible cholecystitis, 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 History of anemia, status post a nondiagnostic bone marrow biopsy 10/21/2014  3. NSTEMI March 2014  4. Admission 05/06/2015 with acute onset right abdomen pain-potentially related to acute appendicitis versus pain from carcinomatosis  CT 05/13/2015 consistent with a right lower abdomen abscess, status post catheter drainage by interventional radiology 05/13/2015  Follow-up CT 05/19/2015 showed resolution of the abscess.  Follow-up evaluation in interventional radiology 06/01/2015 showed the abscess cavity had resolved. The abscess drainage catheter communicated with the cecum via the appendix. The catheter was left in place.  CT abdomen/pelvis 06/08/2015 showed the right pelvic drain in place without recurrent or residual surrounding fluid collection. Peritoneal metastasis with slight increase in small to moderate volume of abdominal pelvic ascites.  Drainage catheter injection 06/12/2015 showed residual tiny fistulous connection with the end of the decompressed abscess cavity within the right lower abdominal quadrant and the residual lumen of the appendix.  Paracentesis 06/12/2015 with 5 liters of fluid removed  Paracentesis 06/19/2015-negative cytology and culture  Removal of abscess drainage catheter 06/23/2015  CT 07/26/2015 with increased right abdominal wall inflammation and increased fluid collection at site of previous right abdomen abscess, ascites, and enlargement/inflammation of the appendix  Placement of right lower quadrant percutaneous drainage catheter 07/27/2015. Contrast injection confirmed persistent fistulous connection with the ill-defined fluid collection and the residual appendix and cecum. Paracentesis with 2 L of fluid removed.  Follow-up CT abdomen/pelvis 08/05/2015 with small to moderate left pleural effusion and moderate to large volume ascites with both appearing  slightly decreased since the prior study. Right lower quadrant drainage catheter in place. 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 Drainage catheter left in place.  Tube evaluation 02/10/2016, persistent fistula  Tube evaluation 03/01/2016, continued fistula to the cecum  Tube downsized 05/04/2016, no residual abscess seen  Collapsed abscess with a stable fistula to the cecum on imaging 06/16/2016  Tube removed 07/26/2016  Recurrent drainage from the right lower quadrant tube site August 2018, status post antibiotics with improvement, drainage again November 2018-antibiotic prescribed by Dr. [name]   5. C. difficile colitis 05/10/2015  6. Right leg edema 12/18/2015. Negative venous Doppler.    Disposition: [name] appears stable.  There is no clinical evidence of progression of the gastrointestinal stromal tumor.  He will continue Gleevec.  He will return for lab and follow-up in 3 months.  He will contact the office in the interim with any problems.  Plan reviewed with Dr. [name].  Ned Card ANP/GNP-BC   04/23/2018  10:10 AM

## 2018-04-30 MED FILL — GLEEVEC 400 MG TABLET: 400 | 30 days supply | Qty: 30 | Fill #4

## 2018-05-21 DIAGNOSIS — R6889 Other general symptoms and signs: Secondary | ICD-10-CM | POA: Diagnosis not present

## 2018-05-21 DIAGNOSIS — H1033 Unspecified acute conjunctivitis, bilateral: Secondary | ICD-10-CM | POA: Diagnosis not present

## 2018-06-04 MED FILL — GLEEVEC 400 MG TABLET: 400 | 30 days supply | Qty: 30 | Fill #5

## 2018-06-25 ENCOUNTER — Other Ambulatory Visit: Payer: Self-pay | Admitting: Oncology

## 2018-07-02 DIAGNOSIS — Z961 Presence of intraocular lens: Secondary | ICD-10-CM | POA: Diagnosis not present

## 2018-07-02 DIAGNOSIS — H02105 Unspecified ectropion of left lower eyelid: Secondary | ICD-10-CM | POA: Diagnosis not present

## 2018-07-02 DIAGNOSIS — H02102 Unspecified ectropion of right lower eyelid: Secondary | ICD-10-CM | POA: Diagnosis not present

## 2018-07-03 MED FILL — GLEEVEC 400 MG TABLET: 400 | 30 days supply | Qty: 30 | Fill #0

## 2018-07-23 ENCOUNTER — Inpatient Hospital Stay: Payer: Medicare Other

## 2018-07-23 ENCOUNTER — Telehealth: Payer: Self-pay | Admitting: Oncology

## 2018-07-23 ENCOUNTER — Inpatient Hospital Stay: Payer: Medicare Other | Attending: Oncology | Admitting: Oncology

## 2018-07-23 VITALS — BP 158/72 | HR 70 | Temp 98.5°F | Resp 17 | Ht 67.5 in | Wt 169.2 lb

## 2018-07-23 DIAGNOSIS — C786 Secondary malignant neoplasm of retroperitoneum and peritoneum: Secondary | ICD-10-CM | POA: Diagnosis not present

## 2018-07-23 DIAGNOSIS — I252 Old myocardial infarction: Secondary | ICD-10-CM | POA: Diagnosis not present

## 2018-07-23 DIAGNOSIS — C49A3 Gastrointestinal stromal tumor of small intestine: Secondary | ICD-10-CM | POA: Diagnosis not present

## 2018-07-23 DIAGNOSIS — H05229 Edema of unspecified orbit: Secondary | ICD-10-CM | POA: Insufficient documentation

## 2018-07-23 LAB — CBC WITH DIFFERENTIAL (CANCER CENTER ONLY)
Basophils Absolute: 0 10*3/uL (ref 0.0–0.1)
Basophils Relative: 1 %
EOS ABS: 0.1 10*3/uL (ref 0.0–0.5)
EOS PCT: 2 %
HCT: 31.6 % — ABNORMAL LOW (ref 38.4–49.9)
Hemoglobin: 10.9 g/dL — ABNORMAL LOW (ref 13.0–17.1)
Lymphocytes Relative: 20 %
Lymphs Abs: 0.8 10*3/uL — ABNORMAL LOW (ref 0.9–3.3)
MCH: 37 pg — AB (ref 27.2–33.4)
MCHC: 34.6 g/dL (ref 32.0–36.0)
MCV: 107 fL — ABNORMAL HIGH (ref 79.3–98.0)
MONO ABS: 0.4 10*3/uL (ref 0.1–0.9)
MONOS PCT: 10 %
Neutro Abs: 2.8 10*3/uL (ref 1.5–6.5)
Neutrophils Relative %: 67 %
PLATELETS: 172 10*3/uL (ref 140–400)
RBC: 2.95 MIL/uL — ABNORMAL LOW (ref 4.20–5.82)
RDW: 13.1 % (ref 11.0–14.6)
WBC Count: 4.1 10*3/uL (ref 4.0–10.3)

## 2018-07-23 LAB — CMP (CANCER CENTER ONLY)
ALK PHOS: 50 U/L (ref 38–126)
ALT: 15 U/L (ref 0–44)
ANION GAP: 8 (ref 5–15)
AST: 21 U/L (ref 15–41)
Albumin: 3.5 g/dL (ref 3.5–5.0)
BUN: 18 mg/dL (ref 8–23)
CALCIUM: 8.6 mg/dL — AB (ref 8.9–10.3)
CHLORIDE: 108 mmol/L (ref 98–111)
CO2: 27 mmol/L (ref 22–32)
Creatinine: 1.5 mg/dL — ABNORMAL HIGH (ref 0.61–1.24)
GFR, EST AFRICAN AMERICAN: 46 mL/min — AB (ref >60)
GFR, Estimated: 40 mL/min — ABNORMAL LOW (ref >60)
Glucose, Bld: 121 mg/dL — ABNORMAL HIGH (ref 70–99)
POTASSIUM: 3.8 mmol/L (ref 3.5–5.1)
SODIUM: 143 mmol/L (ref 135–145)
Total Bilirubin: 0.5 mg/dL (ref 0.3–1.2)
Total Protein: 6.2 g/dL — ABNORMAL LOW (ref 6.5–8.1)

## 2018-07-23 NOTE — Progress Notes (Signed)
[name] OFFICE PROGRESS NOTE   Diagnosis: Gastrointestinal stromal tumor  INTERVAL HISTORY:   [name] [name] returns as scheduled.  He continues [place].  No diarrhea.  He feels well.  He is working outside.  Objective:  Vital signs in last 24 hours:  Blood pressure (!) 158/72, pulse 70, temperature 98.5 F (36.9 C), temperature source Oral, resp. rate 17, height 5' 7.5" (1.715 m), weight 169 lb 3.2 oz (76.7 kg), SpO2 96 %.    HEENT: Mild periorbital edema, no thrush or ulcers Resp: Lungs clear bilaterally Cardio: Regular rate and rhythm GI: No hepatosplenomegaly, reducible right inguinal hernia Vascular: No leg edema  Skin: No rash, the right lower quadrant drain site is healed  Lab Results:  Lab Results  Component Value Date   WBC 4.1 07/23/2018   HGB 10.9 (L) 07/23/2018   HCT 31.6 (L) 07/23/2018   MCV 107.0 (H) 07/23/2018   PLT 172 07/23/2018   NEUTROABS 2.8 07/23/2018    CMP  Lab Results  Component Value Date   NA 142 04/23/2018   K 4.6 04/23/2018   CL 109 04/23/2018   CO2 29 04/23/2018   GLUCOSE 98 04/23/2018   BUN 24 04/23/2018   CREATININE 1.39 (H) 04/23/2018   CALCIUM 9.2 04/23/2018   PROT 6.5 04/23/2018   ALBUMIN 3.7 04/23/2018   AST 21 04/23/2018   ALT 15 04/23/2018   ALKPHOS 55 04/23/2018   BILITOT 0.4 04/23/2018   GFRNONAA 44 (L) 04/23/2018   GFRAA 51 (L) 04/23/2018   Medications: I have reviewed the patient's current medications.   Assessment/Plan: 1. 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  CT 10/16/2014 consistent with extensive carcinomatosis, CT-guided biopsy of an omental mass 10/21/2014 confirmed a gastrointestinal stromal tumor  Initiation of Gleevec 10/31/2014  CT 01/13/2015 revealed improvement in the peritoneal and omental metastatic disease  CT 05/05/2015 with no progression of omental/peritoneal metastatic disease, increased ascites, and appendix  inflammation  CT 07/07/2016-no abscess, mild perihepatic and left pericolic ascites and mesenteric edema  CT of the pelvis 09/20/2016, no residual abscess, no ascites  CT abdomen pelvis renal stones study 04/03/2017-partial small bowel obstruction, potentially related to right inguinal hernia  CT abdomen/pelvis 08/18/2017-possible cholecystitis, 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 History of anemia, status post a nondiagnostic bone marrow biopsy 10/21/2014  3. NSTEMI March 2014  4. Admission 05/06/2015 with acute onset right abdomen pain-potentially related to acute appendicitis versus pain from carcinomatosis  CT 05/13/2015 consistent with a right lower abdomen abscess, status post catheter drainage by interventional radiology 05/13/2015  Follow-up CT 05/19/2015 showed resolution of the abscess.  Follow-up evaluation in interventional radiology 06/01/2015 showed the abscess cavity had resolved. The abscess drainage catheter communicated with the cecum via the appendix. The catheter was left in place.  CT abdomen/pelvis 06/08/2015 showed the right pelvic drain in place without recurrent or residual surrounding fluid collection. Peritoneal metastasis with slight increase in small to moderate volume of abdominal pelvic ascites.  Drainage catheter injection 06/12/2015 showed residual tiny fistulous connection with the end of the decompressed abscess cavity within the right lower abdominal quadrant and the residual lumen of the appendix.  Paracentesis 06/12/2015 with 5 liters of fluid removed  Paracentesis 06/19/2015-negative cytology and culture  Removal of abscess drainage catheter 06/23/2015  CT 07/26/2015 with increased right abdominal wall inflammation and increased fluid collection at site of previous right abdomen abscess, ascites, and enlargement/inflammation of the appendix  Placement of right lower quadrant  percutaneous drainage catheter 07/27/2015.  Contrast injection confirmed persistent fistulous connection with the ill-defined fluid collection and the residual appendix and cecum. Paracentesis with 2 L of fluid removed.  Follow-up CT abdomen/pelvis 08/05/2015 with small to moderate left pleural effusion and moderate to large volume ascites with both appearing slightly decreased since the prior study. Right lower quadrant drainage catheter in place. 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 Drainage catheter left in place.  Tube evaluation 02/10/2016, persistent fistula  Tube evaluation 03/01/2016, continued fistula to the cecum  Tube downsized 05/04/2016, no residual abscess seen  Collapsed abscess with a stable fistula to the cecum on imaging 06/16/2016  Tube removed 07/26/2016  Recurrent drainage from the right lower quadrant tube site August 2018, status post antibiotics with improvement, drainage again November 2018-antibiotic prescribed by Dr. [name]   5. C. difficile colitis 05/10/2015  6. Right leg edema 12/18/2015. Negative venous Doppler.   Disposition: [name] [name] remains in clinical remission from the gastrointestinal stromal tumor.  He will continue Gleevec.  He will return for an office and lab visit in 3 months.  15 minutes were spent with the patient today.  The majority of the time was used for counseling and coordination of care.  [name] Coder, MD  07/23/2018  8:37 AM

## 2018-07-23 NOTE — Telephone Encounter (Signed)
Appointments scheduled AVS/Calendar printed per 8/5 los °

## 2018-07-31 MED FILL — GLEEVEC 400 MG TABLET: 400 | 30 days supply | Qty: 30 | Fill #1

## 2018-08-09 ENCOUNTER — Encounter: Payer: Self-pay | Admitting: Internal Medicine

## 2018-08-09 DIAGNOSIS — I10 Essential (primary) hypertension: Secondary | ICD-10-CM | POA: Diagnosis not present

## 2018-08-09 DIAGNOSIS — Z0000 Encounter for general adult medical examination without abnormal findings: Secondary | ICD-10-CM | POA: Diagnosis not present

## 2018-08-09 DIAGNOSIS — I252 Old myocardial infarction: Secondary | ICD-10-CM | POA: Diagnosis not present

## 2018-08-09 DIAGNOSIS — N183 Chronic kidney disease, stage 3 (moderate): Secondary | ICD-10-CM | POA: Diagnosis not present

## 2018-08-09 DIAGNOSIS — Z23 Encounter for immunization: Secondary | ICD-10-CM | POA: Diagnosis not present

## 2018-08-09 DIAGNOSIS — I2510 Atherosclerotic heart disease of native coronary artery without angina pectoris: Secondary | ICD-10-CM | POA: Diagnosis not present

## 2018-08-09 DIAGNOSIS — E782 Mixed hyperlipidemia: Secondary | ICD-10-CM | POA: Diagnosis not present

## 2018-08-21 DIAGNOSIS — H04563 Stenosis of bilateral lacrimal punctum: Secondary | ICD-10-CM | POA: Diagnosis not present

## 2018-08-21 DIAGNOSIS — H02015 Cicatricial entropion of left lower eyelid: Secondary | ICD-10-CM | POA: Diagnosis not present

## 2018-08-21 DIAGNOSIS — H02012 Cicatricial entropion of right lower eyelid: Secondary | ICD-10-CM | POA: Diagnosis not present

## 2018-08-21 DIAGNOSIS — H11823 Conjunctivochalasis, bilateral: Secondary | ICD-10-CM | POA: Diagnosis not present

## 2018-08-21 DIAGNOSIS — H02831 Dermatochalasis of right upper eyelid: Secondary | ICD-10-CM | POA: Diagnosis not present

## 2018-08-21 DIAGNOSIS — H04229 Epiphora due to insufficient drainage, unspecified lacrimal gland: Secondary | ICD-10-CM | POA: Diagnosis not present

## 2018-08-21 DIAGNOSIS — H01021 Squamous blepharitis right upper eyelid: Secondary | ICD-10-CM | POA: Diagnosis not present

## 2018-08-21 DIAGNOSIS — H04223 Epiphora due to insufficient drainage, bilateral lacrimal glands: Secondary | ICD-10-CM | POA: Diagnosis not present

## 2018-08-21 DIAGNOSIS — H01024 Squamous blepharitis left upper eyelid: Secondary | ICD-10-CM | POA: Diagnosis not present

## 2018-08-21 DIAGNOSIS — H02834 Dermatochalasis of left upper eyelid: Secondary | ICD-10-CM | POA: Diagnosis not present

## 2018-08-29 MED FILL — GLEEVEC 400 MG TABLET: 400 | 30 days supply | Qty: 30 | Fill #2

## 2018-09-11 DIAGNOSIS — R197 Diarrhea, unspecified: Secondary | ICD-10-CM | POA: Diagnosis not present

## 2018-09-11 DIAGNOSIS — R630 Anorexia: Secondary | ICD-10-CM | POA: Diagnosis not present

## 2018-09-11 DIAGNOSIS — D509 Iron deficiency anemia, unspecified: Secondary | ICD-10-CM | POA: Diagnosis not present

## 2018-09-11 DIAGNOSIS — R1013 Epigastric pain: Secondary | ICD-10-CM | POA: Diagnosis not present

## 2018-09-11 DIAGNOSIS — Z85028 Personal history of other malignant neoplasm of stomach: Secondary | ICD-10-CM | POA: Diagnosis not present

## 2018-09-11 DIAGNOSIS — I2510 Atherosclerotic heart disease of native coronary artery without angina pectoris: Secondary | ICD-10-CM | POA: Diagnosis not present

## 2018-09-11 DIAGNOSIS — E782 Mixed hyperlipidemia: Secondary | ICD-10-CM | POA: Diagnosis not present

## 2018-09-11 DIAGNOSIS — I10 Essential (primary) hypertension: Secondary | ICD-10-CM | POA: Diagnosis not present

## 2018-10-01 DIAGNOSIS — I10 Essential (primary) hypertension: Secondary | ICD-10-CM | POA: Diagnosis not present

## 2018-10-01 DIAGNOSIS — R197 Diarrhea, unspecified: Secondary | ICD-10-CM | POA: Diagnosis not present

## 2018-10-01 MED FILL — GLEEVEC 400 MG TABLET: 400 | 30 days supply | Qty: 30 | Fill #3

## 2018-10-08 DIAGNOSIS — R6883 Chills (without fever): Secondary | ICD-10-CM | POA: Diagnosis not present

## 2018-10-08 DIAGNOSIS — A0472 Enterocolitis due to Clostridium difficile, not specified as recurrent: Secondary | ICD-10-CM | POA: Diagnosis not present

## 2018-10-23 ENCOUNTER — Inpatient Hospital Stay: Payer: Medicare Other | Attending: Oncology | Admitting: Oncology

## 2018-10-23 ENCOUNTER — Ambulatory Visit: Payer: Medicare Other

## 2018-10-23 ENCOUNTER — Telehealth: Payer: Self-pay

## 2018-10-23 ENCOUNTER — Inpatient Hospital Stay: Payer: Medicare Other

## 2018-10-23 ENCOUNTER — Other Ambulatory Visit: Payer: Self-pay | Admitting: *Deleted

## 2018-10-23 VITALS — BP 169/69 | HR 82 | Temp 98.4°F | Resp 14 | Ht 67.5 in | Wt 169.4 lb

## 2018-10-23 DIAGNOSIS — Z23 Encounter for immunization: Secondary | ICD-10-CM | POA: Insufficient documentation

## 2018-10-23 DIAGNOSIS — C49A3 Gastrointestinal stromal tumor of small intestine: Secondary | ICD-10-CM

## 2018-10-23 DIAGNOSIS — I252 Old myocardial infarction: Secondary | ICD-10-CM | POA: Diagnosis not present

## 2018-10-23 DIAGNOSIS — K4090 Unilateral inguinal hernia, without obstruction or gangrene, not specified as recurrent: Secondary | ICD-10-CM | POA: Diagnosis not present

## 2018-10-23 DIAGNOSIS — D649 Anemia, unspecified: Secondary | ICD-10-CM | POA: Diagnosis not present

## 2018-10-23 LAB — CMP (CANCER CENTER ONLY)
ALBUMIN: 3.4 g/dL — AB (ref 3.5–5.0)
ALK PHOS: 40 U/L (ref 38–126)
ALT: 14 U/L (ref 0–44)
AST: 19 U/L (ref 15–41)
Anion gap: 7 (ref 5–15)
BUN: 18 mg/dL (ref 8–23)
CHLORIDE: 112 mmol/L — AB (ref 98–111)
CO2: 24 mmol/L (ref 22–32)
CREATININE: 1.33 mg/dL — AB (ref 0.61–1.24)
Calcium: 8.5 mg/dL — ABNORMAL LOW (ref 8.9–10.3)
GFR, EST AFRICAN AMERICAN: 54 mL/min — AB (ref >60)
GFR, Estimated: 46 mL/min — ABNORMAL LOW (ref >60)
GLUCOSE: 105 mg/dL — AB (ref 70–99)
Potassium: 3.6 mmol/L (ref 3.5–5.1)
Sodium: 143 mmol/L (ref 135–145)
Total Bilirubin: 0.4 mg/dL (ref 0.3–1.2)
Total Protein: 6.4 g/dL — ABNORMAL LOW (ref 6.5–8.1)

## 2018-10-23 LAB — CBC WITH DIFFERENTIAL (CANCER CENTER ONLY)
ABS IMMATURE GRANULOCYTES: 0.03 10*3/uL (ref 0.00–0.07)
BASOS ABS: 0 10*3/uL (ref 0.0–0.1)
BASOS PCT: 0 %
EOS ABS: 0.6 10*3/uL — AB (ref 0.0–0.5)
Eosinophils Relative: 9 %
HCT: 29.4 % — ABNORMAL LOW (ref 39.0–52.0)
Hemoglobin: 9.8 g/dL — ABNORMAL LOW (ref 13.0–17.0)
Immature Granulocytes: 0 %
Lymphocytes Relative: 10 %
Lymphs Abs: 0.7 10*3/uL (ref 0.7–4.0)
MCH: 36.6 pg — ABNORMAL HIGH (ref 26.0–34.0)
MCHC: 33.3 g/dL (ref 30.0–36.0)
MCV: 109.7 fL — AB (ref 80.0–100.0)
MONOS PCT: 8 %
Monocytes Absolute: 0.5 10*3/uL (ref 0.1–1.0)
NEUTROS PCT: 73 %
NRBC: 0 % (ref 0.0–0.2)
Neutro Abs: 4.8 10*3/uL (ref 1.7–7.7)
PLATELETS: 215 10*3/uL (ref 150–400)
RBC: 2.68 MIL/uL — AB (ref 4.22–5.81)
RDW: 13.1 % (ref 11.5–15.5)
WBC: 6.7 10*3/uL (ref 4.0–10.5)

## 2018-10-23 MED ORDER — INFLUENZA VAC SPLIT QUAD 0.5 ML IM SUSY
PREFILLED_SYRINGE | INTRAMUSCULAR | Status: AC
  Filled 2018-10-23: qty 0.5

## 2018-10-23 MED ORDER — INFLUENZA VAC SPLIT QUAD 0.5 ML IM SUSY
0.5000 mL | PREFILLED_SYRINGE | Freq: Once | INTRAMUSCULAR | Status: AC
  Administered 2018-10-23: 0.5 mL via INTRAMUSCULAR

## 2018-10-23 MED ORDER — FLUCONAZOLE 100 MG PO TABS: ORAL_TABLET | ORAL | 0 refills | Status: DC

## 2018-10-23 NOTE — Progress Notes (Signed)
[facility] OFFICE PROGRESS NOTE   Diagnosis: Gastrointestinal stromal tumor  INTERVAL HISTORY:   [name] returns as scheduled.  He continues [place].  He was recently diagnosed with C. difficile colitis.  He completed a course of metronidazole on 10/21/2018.  He reports no diarrhea for the past week. He has a persistent right inguinal hernia.  This is not painful.  He plans to have this repaired early next year. He is scheduled to undergo eye surgery to include tear duct repair and a blepharoplasty.  Objective:  Vital signs in last 24 hours:  Blood pressure (!) 169/69, pulse 82, temperature 98.4 F (36.9 C), temperature source Oral, resp. rate 14, height 5' 7.5" (1.715 m), weight 169 lb 6.4 oz (76.8 kg), SpO2 99 %.    HEENT: Mild conjunctival erythema.  Mild periorbital edema Resp: Clear bilaterally Cardio: Regular rate and rhythm GI: No hepatosplenomegaly, nontender, right inguinal hernia Vascular: No leg edema      Lab Results:  Lab Results  Component Value Date   WBC 4.1 07/23/2018   HGB 10.9 (L) 07/23/2018   HCT 31.6 (L) 07/23/2018   MCV 107.0 (H) 07/23/2018   PLT 172 07/23/2018   NEUTROABS 2.8 07/23/2018    CMP  Lab Results  Component Value Date   NA 143 07/23/2018   K 3.8 07/23/2018   CL 108 07/23/2018   CO2 27 07/23/2018   GLUCOSE 121 (H) 07/23/2018   BUN 18 07/23/2018   CREATININE 1.50 (H) 07/23/2018   CALCIUM 8.6 (L) 07/23/2018   PROT 6.2 (L) 07/23/2018   ALBUMIN 3.5 07/23/2018   AST 21 07/23/2018   ALT 15 07/23/2018   ALKPHOS 50 07/23/2018   BILITOT 0.5 07/23/2018   GFRNONAA 40 (L) 07/23/2018   GFRAA 46 (L) 07/23/2018     Medications: I have reviewed the patient's current medications.   Assessment/Plan: 1. 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  CT 10/16/2014 consistent with extensive carcinomatosis, CT-guided biopsy of an omental mass 10/21/2014 confirmed a  gastrointestinal stromal tumor  Initiation of Gleevec 10/31/2014  CT 01/13/2015 revealed improvement in the peritoneal and omental metastatic disease  CT 05/05/2015 with no progression of omental/peritoneal metastatic disease, increased ascites, and appendix inflammation  CT 07/07/2016-no abscess, mild perihepatic and left pericolic ascites and mesenteric edema  CT of the pelvis 09/20/2016, no residual abscess, no ascites  CT abdomen pelvis renal stones study 04/03/2017-partial small bowel obstruction, potentially related to right inguinal hernia  CT abdomen/pelvis 08/18/2017-possible cholecystitis, 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 History of anemia, status post a nondiagnostic bone marrow biopsy 10/21/2014  3. NSTEMI March 2014  4. Admission 05/06/2015 with acute onset right abdomen pain-potentially related to acute appendicitis versus pain from carcinomatosis  CT 05/13/2015 consistent with a right lower abdomen abscess, status post catheter drainage by interventional radiology 05/13/2015  Follow-up CT 05/19/2015 showed resolution of the abscess.  Follow-up evaluation in interventional radiology 06/01/2015 showed the abscess cavity had resolved. The abscess drainage catheter communicated with the cecum via the appendix. The catheter was left in place.  CT abdomen/pelvis 06/08/2015 showed the right pelvic drain in place without recurrent or residual surrounding fluid collection. Peritoneal metastasis with slight increase in small to moderate volume of abdominal pelvic ascites.  Drainage catheter injection 06/12/2015 showed residual tiny fistulous connection with the end of the decompressed abscess cavity within the right lower abdominal quadrant and the residual lumen of the appendix.  Paracentesis 06/12/2015 with 5 liters  of fluid removed  Paracentesis 06/19/2015-negative cytology and culture  Removal of abscess drainage catheter 06/23/2015  CT  07/26/2015 with increased right abdominal wall inflammation and increased fluid collection at site of previous right abdomen abscess, ascites, and enlargement/inflammation of the appendix  Placement of right lower quadrant percutaneous drainage catheter 07/27/2015. Contrast injection confirmed persistent fistulous connection with the ill-defined fluid collection and the residual appendix and cecum. Paracentesis with 2 L of fluid removed.  Follow-up CT abdomen/pelvis 08/05/2015 with small to moderate left pleural effusion and moderate to large volume ascites with both appearing slightly decreased since the prior study. Right lower quadrant drainage catheter in place. 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 Drainage catheter left in place.  Tube evaluation 02/10/2016, persistent fistula  Tube evaluation 03/01/2016, continued fistula to the cecum  Tube downsized 05/04/2016, no residual abscess seen  Collapsed abscess with a stable fistula to the cecum on imaging 06/16/2016  Tube removed 07/26/2016  Recurrent drainage from the right lower quadrant tube site August 2018, status post antibiotics with improvement, drainage again November 2018-antibiotic prescribed by Dr. Dalbert [name]   5. C. difficile colitis 05/10/2015, [name] [name] 2019  6. Right leg edema 12/18/2015. Negative venous Doppler.    Disposition: [name] remains in clinical remission from the gastrointestinal stromal tumor.  He will continue Gleevec.  He was recently treated for C. difficile colitis.  He will contact us for recurrent diarrhea.  He reports his primary physician will be leaving practice this week.  [name] contact Dr. Dalbert [name] to schedule an appointment regarding the inguinal hernia repair.  The periorbital edema is likely related to [place].  The epiphora may also be related to [place].   We will contact his ophthalmologist to be sure they are aware of the potential toxicity from [place].  [name] [name] will return for an office visit in 3 months.  He will return to the lab for a CBC and chemistry panel today.  [name] Coder, MD  10/23/2018  8:17 AM

## 2018-10-23 NOTE — Telephone Encounter (Signed)
Printed avs and calender of upcoming appointment. Per 11/5 los 

## 2018-10-24 ENCOUNTER — Telehealth: Payer: Self-pay | Admitting: *Deleted

## 2018-10-24 NOTE — Telephone Encounter (Signed)
Notified patient to hold his simvastatin on the days he takes the fluconozole. Obtained name of his ophthamologist to send office note. Dr. Janelle Floor [phone] and Fax [phone]

## 2018-10-29 DIAGNOSIS — R05 Cough: Secondary | ICD-10-CM | POA: Diagnosis not present

## 2018-10-31 MED FILL — GLEEVEC 400 MG TABLET: 400 | 30 days supply | Qty: 30 | Fill #4

## 2018-11-01 ENCOUNTER — Telehealth: Payer: Self-pay

## 2018-11-01 NOTE — Telephone Encounter (Signed)
Oral Oncology Patient Advocate Encounter  I received a call from the call center stating that he still had a $221.27 copay after using the [employer] grant. PSI is the only grant funding available for his disease state. I called [employer] and they stated that he currently has a $310.16 balance on his grant for this year. The grant will start over on 12/19/18 with a balance of $8000 and be good until 12/19/19. This will have him paying $221.27 for November and his full copay for December.  I called the patient to give him this information and he is going to pay the copays. He verbalized understanding of how the grant works and appreciated me looking into this for him.    [place] Patient [place] Phone [phone] Fax [phone]

## 2018-11-08 DIAGNOSIS — H02035 Senile entropion of left lower eyelid: Secondary | ICD-10-CM | POA: Diagnosis not present

## 2018-11-08 DIAGNOSIS — H02012 Cicatricial entropion of right lower eyelid: Secondary | ICD-10-CM | POA: Diagnosis not present

## 2018-11-08 DIAGNOSIS — H04563 Stenosis of bilateral lacrimal punctum: Secondary | ICD-10-CM | POA: Diagnosis not present

## 2018-11-08 DIAGNOSIS — H11823 Conjunctivochalasis, bilateral: Secondary | ICD-10-CM | POA: Diagnosis not present

## 2018-11-08 DIAGNOSIS — H01021 Squamous blepharitis right upper eyelid: Secondary | ICD-10-CM | POA: Diagnosis not present

## 2018-11-08 DIAGNOSIS — H02015 Cicatricial entropion of left lower eyelid: Secondary | ICD-10-CM | POA: Diagnosis not present

## 2018-11-08 DIAGNOSIS — H02532 Eyelid retraction right lower eyelid: Secondary | ICD-10-CM | POA: Diagnosis not present

## 2018-11-08 DIAGNOSIS — H04223 Epiphora due to insufficient drainage, bilateral lacrimal glands: Secondary | ICD-10-CM | POA: Diagnosis not present

## 2018-11-08 DIAGNOSIS — H02535 Eyelid retraction left lower eyelid: Secondary | ICD-10-CM | POA: Diagnosis not present

## 2018-11-08 DIAGNOSIS — H02834 Dermatochalasis of left upper eyelid: Secondary | ICD-10-CM | POA: Diagnosis not present

## 2018-11-08 DIAGNOSIS — H01024 Squamous blepharitis left upper eyelid: Secondary | ICD-10-CM | POA: Diagnosis not present

## 2018-11-08 DIAGNOSIS — H02831 Dermatochalasis of right upper eyelid: Secondary | ICD-10-CM | POA: Diagnosis not present

## 2018-11-08 DIAGNOSIS — H02032 Senile entropion of right lower eyelid: Secondary | ICD-10-CM | POA: Diagnosis not present

## 2018-11-14 DIAGNOSIS — K4090 Unilateral inguinal hernia, without obstruction or gangrene, not specified as recurrent: Secondary | ICD-10-CM | POA: Diagnosis not present

## 2018-11-14 DIAGNOSIS — Z8509 Personal history of malignant neoplasm of other digestive organs: Secondary | ICD-10-CM | POA: Diagnosis not present

## 2018-11-14 DIAGNOSIS — I2510 Atherosclerotic heart disease of native coronary artery without angina pectoris: Secondary | ICD-10-CM | POA: Diagnosis not present

## 2018-11-14 DIAGNOSIS — Z8619 Personal history of other infectious and parasitic diseases: Secondary | ICD-10-CM | POA: Diagnosis not present

## 2018-11-14 DIAGNOSIS — R1031 Right lower quadrant pain: Secondary | ICD-10-CM | POA: Diagnosis not present

## 2018-11-19 ENCOUNTER — Ambulatory Visit
Admission: RE | Admit: 2018-11-19 | Discharge: 2018-11-19 | Disposition: A | Discharge status: Home / Self Care | Payer: Medicare Other | Source: Ambulatory Visit | Attending: General Surgery | Admitting: General Surgery

## 2018-11-19 ENCOUNTER — Other Ambulatory Visit: Payer: Self-pay | Admitting: General Surgery

## 2018-11-19 DIAGNOSIS — K4090 Unilateral inguinal hernia, without obstruction or gangrene, not specified as recurrent: Secondary | ICD-10-CM | POA: Diagnosis not present

## 2018-11-19 DIAGNOSIS — R1031 Right lower quadrant pain: Secondary | ICD-10-CM

## 2018-11-19 DIAGNOSIS — K5730 Diverticulosis of large intestine without perforation or abscess without bleeding: Secondary | ICD-10-CM | POA: Diagnosis not present

## 2018-11-19 MED ORDER — IOHEXOL 300 MG/ML  SOLN
100.0000 mL | Freq: Once | INTRAMUSCULAR | Status: AC | PRN
  Administered 2018-11-19: 100 mL via INTRAVENOUS

## 2018-11-29 MED FILL — GLEEVEC 400 MG TABLET: 400 | 30 days supply | Qty: 30 | Fill #5

## 2018-11-30 DIAGNOSIS — R1031 Right lower quadrant pain: Secondary | ICD-10-CM | POA: Diagnosis not present

## 2018-12-13 ENCOUNTER — Telehealth: Payer: Self-pay

## 2018-12-13 NOTE — Telephone Encounter (Signed)
Oral Oncology Patient Advocate Encounter  [place] prior authorization was set to expire 12/18/18. I called [employer] to renew the authorization and they told me that is was not good until 03/19/19 without renewing. I put that in my calendar to renew then. I called the patient and left a message with his wife to please have him call me back.  [name] Patient [place] Phone [phone] Fax [phone]

## 2018-12-24 ENCOUNTER — Other Ambulatory Visit: Payer: Self-pay | Admitting: Oncology

## 2018-12-31 MED FILL — GLEEVEC 400 MG TABLET: 400 | 30 days supply | Qty: 30 | Fill #0

## 2019-01-22 ENCOUNTER — Inpatient Hospital Stay (HOSPITAL_BASED_OUTPATIENT_CLINIC_OR_DEPARTMENT_OTHER): Payer: Medicare Other | Admitting: Oncology

## 2019-01-22 ENCOUNTER — Telehealth: Payer: Self-pay | Admitting: Oncology

## 2019-01-22 ENCOUNTER — Inpatient Hospital Stay: Payer: Medicare Other | Attending: Oncology

## 2019-01-22 VITALS — BP 183/56 | HR 62 | Temp 98.1°F | Resp 18 | Ht 67.5 in | Wt 165.8 lb

## 2019-01-22 DIAGNOSIS — C49A3 Gastrointestinal stromal tumor of small intestine: Secondary | ICD-10-CM | POA: Diagnosis not present

## 2019-01-22 DIAGNOSIS — I252 Old myocardial infarction: Secondary | ICD-10-CM

## 2019-01-22 DIAGNOSIS — Z79899 Other long term (current) drug therapy: Secondary | ICD-10-CM | POA: Insufficient documentation

## 2019-01-22 DIAGNOSIS — C7B04 Secondary carcinoid tumors of peritoneum: Secondary | ICD-10-CM | POA: Diagnosis not present

## 2019-01-22 LAB — CBC WITH DIFFERENTIAL (CANCER CENTER ONLY)
Abs Immature Granulocytes: 0 10*3/uL (ref 0.00–0.07)
BASOS ABS: 0 10*3/uL (ref 0.0–0.1)
Basophils Relative: 0 %
EOS ABS: 0.1 10*3/uL (ref 0.0–0.5)
EOS PCT: 1 %
HCT: 31.9 % — ABNORMAL LOW (ref 39.0–52.0)
Hemoglobin: 10.6 g/dL — ABNORMAL LOW (ref 13.0–17.0)
IMMATURE GRANULOCYTES: 0 %
LYMPHS ABS: 0.9 10*3/uL (ref 0.7–4.0)
LYMPHS PCT: 22 %
MCH: 36.3 pg — ABNORMAL HIGH (ref 26.0–34.0)
MCHC: 33.2 g/dL (ref 30.0–36.0)
MCV: 109.2 fL — ABNORMAL HIGH (ref 80.0–100.0)
Monocytes Absolute: 0.4 10*3/uL (ref 0.1–1.0)
Monocytes Relative: 10 %
NEUTROS PCT: 67 %
NRBC: 0 % (ref 0.0–0.2)
Neutro Abs: 2.8 10*3/uL (ref 1.7–7.7)
Platelet Count: 169 10*3/uL (ref 150–400)
RBC: 2.92 MIL/uL — AB (ref 4.22–5.81)
RDW: 12.5 % (ref 11.5–15.5)
WBC: 4.2 10*3/uL (ref 4.0–10.5)

## 2019-01-22 LAB — CMP (CANCER CENTER ONLY)
ALT: 16 U/L (ref 0–44)
ANION GAP: 10 (ref 5–15)
AST: 23 U/L (ref 15–41)
Albumin: 3.8 g/dL (ref 3.5–5.0)
Alkaline Phosphatase: 53 U/L (ref 38–126)
BUN: 19 mg/dL (ref 8–23)
CALCIUM: 8.7 mg/dL — AB (ref 8.9–10.3)
CHLORIDE: 109 mmol/L (ref 98–111)
CO2: 26 mmol/L (ref 22–32)
CREATININE: 1.35 mg/dL — AB (ref 0.61–1.24)
GFR, EST AFRICAN AMERICAN: 54 mL/min — AB (ref >60)
GFR, EST NON AFRICAN AMERICAN: 47 mL/min — AB (ref >60)
Glucose, Bld: 99 mg/dL (ref 70–99)
Potassium: 3.7 mmol/L (ref 3.5–5.1)
SODIUM: 145 mmol/L (ref 135–145)
Total Bilirubin: 0.5 mg/dL (ref 0.3–1.2)
Total Protein: 6.7 g/dL (ref 6.5–8.1)

## 2019-01-22 NOTE — Telephone Encounter (Signed)
Scheduled appt per 02/04 los. ° °Printed calendar and avs. °

## 2019-01-22 NOTE — Progress Notes (Signed)
[place] OFFICE PROGRESS NOTE   Diagnosis: Gastrointestinal stromal tumor  INTERVAL HISTORY:   [name] [name] is as scheduled.  He continues [place].  He recently had eyelid surgery.  He saw Dr. [name] in November for pain at the right lower quadrant pain site.  A CT 11/19/2018 revealed a right inguinal hernia, persistent omental edema, persistent bandlike soft tissue density in the right lower quadrant consistent with visual from a fistula, no evidence for recurrence of the gastrointestinal stromal tumor. [name] [name] feels well other than anxiety today.  Objective:  Vital signs in last 24 hours:  Blood pressure (!) 183/56, pulse 62, temperature 98.1 F (36.7 C), temperature source Oral, resp. rate 18, height 5' 7.5" (1.715 m), weight 165 lb 12.8 oz (75.2 kg), SpO2 100 %.    HEENT: Minimal periorbital edema Resp: Lungs clear bilaterally Cardio: Regular rate and rhythm GI: No hepatomegaly, no mass, right inguinal hernia Vascular: Trace edema at the right greater than left lower leg  Skin: No rash    Lab Results:  Lab Results  Component Value Date   WBC 4.2 01/22/2019   HGB 10.6 (L) 01/22/2019   HCT 31.9 (L) 01/22/2019   MCV 109.2 (H) 01/22/2019   PLT 169 01/22/2019   NEUTROABS 2.8 01/22/2019    CMP  Lab Results  Component Value Date   NA 145 01/22/2019   K 3.7 01/22/2019   CL 109 01/22/2019   CO2 26 01/22/2019   GLUCOSE 99 01/22/2019   BUN 19 01/22/2019   CREATININE 1.35 (H) 01/22/2019   CALCIUM 8.7 (L) 01/22/2019   PROT 6.7 01/22/2019   ALBUMIN 3.8 01/22/2019   AST 23 01/22/2019   ALT 16 01/22/2019   ALKPHOS 53 01/22/2019   BILITOT 0.5 01/22/2019   GFRNONAA 47 (L) 01/22/2019   GFRAA 54 (L) 01/22/2019     Medications: I have reviewed the patient's current medications.   Assessment/Plan:  1. 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  CT 10/16/2014 consistent with extensive carcinomatosis,  CT-guided biopsy of an omental mass 10/21/2014 confirmed a gastrointestinal stromal tumor  Initiation of Gleevec 10/31/2014  CT 01/13/2015 revealed improvement in the peritoneal and omental metastatic disease  CT 05/05/2015 with no progression of omental/peritoneal metastatic disease, increased ascites, and appendix inflammation  CT 07/07/2016-no abscess, mild perihepatic and left pericolic ascites and mesenteric edema  CT of the pelvis 09/20/2016, no residual abscess, no ascites  CT abdomen pelvis renal stones study 04/03/2017-partial small bowel obstruction, potentially related to right inguinal hernia  CT abdomen/pelvis 08/18/2017-possible cholecystitis, 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 History of anemia, status post a nondiagnostic bone marrow biopsy 10/21/2014  3. NSTEMI March 2014  4. Admission 05/06/2015 with acute onset right abdomen pain-potentially related to acute appendicitis versus pain from carcinomatosis  CT 05/13/2015 consistent with a right lower abdomen abscess, status post catheter drainage by interventional radiology 05/13/2015  Follow-up CT 05/19/2015 showed resolution of the abscess.  Follow-up evaluation in interventional radiology 06/01/2015 showed the abscess cavity had resolved. The abscess drainage catheter communicated with the cecum via the appendix. The catheter was left in place.  CT abdomen/pelvis 06/08/2015 showed the right pelvic drain in place without recurrent or residual surrounding fluid collection. Peritoneal metastasis with slight increase in small to moderate volume of abdominal pelvic ascites.  Drainage catheter injection 06/12/2015  showed residual tiny fistulous connection with the end of the decompressed abscess cavity within the right lower abdominal  quadrant and the residual lumen of the appendix.  Paracentesis 06/12/2015 with 5 liters of fluid removed  Paracentesis 06/19/2015-negative cytology and culture  Removal of abscess drainage catheter 06/23/2015  CT 07/26/2015 with increased right abdominal wall inflammation and increased fluid collection at site of previous right abdomen abscess, ascites, and enlargement/inflammation of the appendix  Placement of right lower quadrant percutaneous drainage catheter 07/27/2015. Contrast injection confirmed persistent fistulous connection with the ill-defined fluid collection and the residual appendix and cecum. Paracentesis with 2 L of fluid removed.  Follow-up CT abdomen/pelvis 08/05/2015 with small to moderate left pleural effusion and moderate to large volume ascites with both appearing slightly decreased since the prior study. Right lower quadrant drainage catheter in place. 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 Drainage catheter left in place.  Tube evaluation 02/10/2016, persistent fistula  Tube evaluation 03/01/2016, continued fistula to the cecum  Tube downsized 05/04/2016, no residual abscess seen  Collapsed abscess with a stable fistula to the cecum on imaging 06/16/2016  Tube removed 07/26/2016  Recurrent drainage from the right lower quadrant tube site August 2018, status post antibiotics with improvement, drainage again November 2018-antibiotic prescribed by Dr. [name]   5. C. difficile colitis 05/10/2015, Lamount Cranker 2019  6. Right leg edema 12/18/2015. Negative venous Doppler.     Disposition: [name] [name] appears unchanged.  He is in clinical remission from the gastrointestinal stromal tumor.  He will continue Gleevec.  He will return for an office and lab visit in 3 months. 15 minutes were spent with the patient today.  The majority of  the time was used for counseling and coordination of care.  Betsy Coder, MD  01/22/2019  9:37 AM

## 2019-01-24 ENCOUNTER — Telehealth: Payer: Self-pay

## 2019-01-24 NOTE — Telephone Encounter (Signed)
Oral Oncology Patient Advocate Encounter  Received notification from [facility] that the existing prior authorization for [place] is due for renewal.  I called [employer] at [phone] and submitted a new PA. Ref # [id number] Status is pending  Oral Oncology Clinic will continue to follow.  [place] Patient [place] Phone [phone] Fax [phone]

## 2019-01-28 MED FILL — GLEEVEC 400 MG TABLET: 400 | 30 days supply | Qty: 30 | Fill #1

## 2019-01-28 NOTE — Telephone Encounter (Addendum)
Oral Oncology Patient Advocate Encounter  Prior Authorization for [place] has been approved.    Effective dates: 01/25/19 through 12/19/19  I called the patient and gave him this information and left a voicemail.  Oral Oncology Clinic will continue to follow.  [place] Patient [place] Phone [phone] Fax [phone]

## 2019-02-06 DIAGNOSIS — H04222 Epiphora due to insufficient drainage, left lacrimal gland: Secondary | ICD-10-CM | POA: Diagnosis not present

## 2019-02-06 DIAGNOSIS — Z09 Encounter for follow-up examination after completed treatment for conditions other than malignant neoplasm: Secondary | ICD-10-CM | POA: Diagnosis not present

## 2019-02-06 DIAGNOSIS — H01021 Squamous blepharitis right upper eyelid: Secondary | ICD-10-CM | POA: Diagnosis not present

## 2019-02-06 DIAGNOSIS — H02831 Dermatochalasis of right upper eyelid: Secondary | ICD-10-CM | POA: Diagnosis not present

## 2019-02-06 DIAGNOSIS — H01024 Squamous blepharitis left upper eyelid: Secondary | ICD-10-CM | POA: Diagnosis not present

## 2019-02-06 DIAGNOSIS — H04552 Acquired stenosis of left nasolacrimal duct: Secondary | ICD-10-CM | POA: Diagnosis not present

## 2019-02-06 DIAGNOSIS — H02834 Dermatochalasis of left upper eyelid: Secondary | ICD-10-CM | POA: Diagnosis not present

## 2019-02-11 DIAGNOSIS — I700 Atherosclerosis of aorta: Secondary | ICD-10-CM | POA: Diagnosis not present

## 2019-02-11 DIAGNOSIS — N183 Chronic kidney disease, stage 3 (moderate): Secondary | ICD-10-CM | POA: Diagnosis not present

## 2019-02-11 DIAGNOSIS — C49A3 Gastrointestinal stromal tumor of small intestine: Secondary | ICD-10-CM | POA: Diagnosis not present

## 2019-02-11 DIAGNOSIS — I10 Essential (primary) hypertension: Secondary | ICD-10-CM | POA: Diagnosis not present

## 2019-02-11 DIAGNOSIS — E782 Mixed hyperlipidemia: Secondary | ICD-10-CM | POA: Diagnosis not present

## 2019-02-28 MED FILL — GLEEVEC 400 MG TABLET: 400 | 30 days supply | Qty: 30 | Fill #2

## 2019-03-21 ENCOUNTER — Telehealth: Payer: Self-pay | Admitting: Pharmacist

## 2019-03-21 NOTE — Telephone Encounter (Signed)
Oral Chemotherapy Pharmacist Encounter  Follow-Up Form  Spoke with patient's wife, [name], today to follow up regarding patient's oral chemotherapy medication: Gleevec (imatinib) for the treatment of gastrointestinal stromal tumor of small bowel, planned duration until disease progression or unacceptable toxicity  Original Start date of oral chemotherapy: 10/31/2014  Pt is doing well today  [name] reports 0 tablets/doses of Gleevec 400 mg tablets, 1 tablet taken by mouth once daily right after breakfast, missed in the last month. [name] states patient has 13 tablets remaining in current bottle.  Pt reports the following side effects: None to report  Pertinent labs reviewed: Okay for continued treatment.  Other Issues: Coordinated next fill of [place] to ship from the [facility] long outpatient pharmacy on 03/28/2019 for delivery to patient's home on 03/29/2019.  Confirmed office visits on 04/22/2019 with [name].  They know to call the office with questions or concerns. Oral Oncology Clinic will continue to follow.  Johny Drilling, PharmD, BCPS, BCOP  03/21/2019 3:32 PM Oral Oncology Clinic [phone]

## 2019-03-25 DIAGNOSIS — M545 Low back pain: Secondary | ICD-10-CM | POA: Diagnosis not present

## 2019-03-25 DIAGNOSIS — K6289 Other specified diseases of anus and rectum: Secondary | ICD-10-CM | POA: Diagnosis not present

## 2019-03-28 MED FILL — GLEEVEC 400 MG TABLET: 400 | 30 days supply | Qty: 30 | Fill #3

## 2019-04-22 ENCOUNTER — Inpatient Hospital Stay (HOSPITAL_BASED_OUTPATIENT_CLINIC_OR_DEPARTMENT_OTHER): Payer: Medicare Other | Admitting: Oncology

## 2019-04-22 ENCOUNTER — Inpatient Hospital Stay: Payer: Medicare Other | Attending: Oncology

## 2019-04-22 ENCOUNTER — Other Ambulatory Visit: Payer: Self-pay

## 2019-04-22 ENCOUNTER — Telehealth: Payer: Self-pay | Admitting: Oncology

## 2019-04-22 ENCOUNTER — Telehealth: Payer: Self-pay | Admitting: *Deleted

## 2019-04-22 VITALS — BP 153/60 | HR 72 | Temp 98.2°F | Resp 18 | Ht 67.5 in | Wt 163.7 lb

## 2019-04-22 DIAGNOSIS — K4090 Unilateral inguinal hernia, without obstruction or gangrene, not specified as recurrent: Secondary | ICD-10-CM | POA: Insufficient documentation

## 2019-04-22 DIAGNOSIS — Z9049 Acquired absence of other specified parts of digestive tract: Secondary | ICD-10-CM

## 2019-04-22 DIAGNOSIS — R600 Localized edema: Secondary | ICD-10-CM | POA: Diagnosis not present

## 2019-04-22 DIAGNOSIS — N189 Chronic kidney disease, unspecified: Secondary | ICD-10-CM | POA: Insufficient documentation

## 2019-04-22 DIAGNOSIS — C49A3 Gastrointestinal stromal tumor of small intestine: Secondary | ICD-10-CM | POA: Insufficient documentation

## 2019-04-22 DIAGNOSIS — C786 Secondary malignant neoplasm of retroperitoneum and peritoneum: Secondary | ICD-10-CM

## 2019-04-22 DIAGNOSIS — Z79899 Other long term (current) drug therapy: Secondary | ICD-10-CM | POA: Diagnosis not present

## 2019-04-22 DIAGNOSIS — I252 Old myocardial infarction: Secondary | ICD-10-CM | POA: Diagnosis not present

## 2019-04-22 LAB — CBC WITH DIFFERENTIAL (CANCER CENTER ONLY)
Abs Immature Granulocytes: 0.01 10*3/uL (ref 0.00–0.07)
Basophils Absolute: 0 10*3/uL (ref 0.0–0.1)
Basophils Relative: 0 %
Eosinophils Absolute: 0.1 10*3/uL (ref 0.0–0.5)
Eosinophils Relative: 2 %
HCT: 30.3 % — ABNORMAL LOW (ref 39.0–52.0)
Hemoglobin: 10 g/dL — ABNORMAL LOW (ref 13.0–17.0)
Immature Granulocytes: 0 %
Lymphocytes Relative: 20 %
Lymphs Abs: 0.9 10*3/uL (ref 0.7–4.0)
MCH: 36.2 pg — ABNORMAL HIGH (ref 26.0–34.0)
MCHC: 33 g/dL (ref 30.0–36.0)
MCV: 109.8 fL — ABNORMAL HIGH (ref 80.0–100.0)
Monocytes Absolute: 0.3 10*3/uL (ref 0.1–1.0)
Monocytes Relative: 7 %
Neutro Abs: 3.3 10*3/uL (ref 1.7–7.7)
Neutrophils Relative %: 71 %
Platelet Count: 177 10*3/uL (ref 150–400)
RBC: 2.76 MIL/uL — ABNORMAL LOW (ref 4.22–5.81)
RDW: 13.1 % (ref 11.5–15.5)
WBC Count: 4.6 10*3/uL (ref 4.0–10.5)
nRBC: 0 % (ref 0.0–0.2)

## 2019-04-22 LAB — CMP (CANCER CENTER ONLY)
ALT: 22 U/L (ref 0–44)
AST: 41 U/L (ref 15–41)
Albumin: 3.5 g/dL (ref 3.5–5.0)
Alkaline Phosphatase: 54 U/L (ref 38–126)
Anion gap: 9 (ref 5–15)
BUN: 25 mg/dL — ABNORMAL HIGH (ref 8–23)
CO2: 26 mmol/L (ref 22–32)
Calcium: 8.1 mg/dL — ABNORMAL LOW (ref 8.9–10.3)
Chloride: 108 mmol/L (ref 98–111)
Creatinine: 1.72 mg/dL — ABNORMAL HIGH (ref 0.61–1.24)
GFR, Est AFR Am: 41 mL/min — ABNORMAL LOW (ref >60)
GFR, Estimated: 35 mL/min — ABNORMAL LOW (ref >60)
Glucose, Bld: 124 mg/dL — ABNORMAL HIGH (ref 70–99)
Potassium: 3.5 mmol/L (ref 3.5–5.1)
Sodium: 143 mmol/L (ref 135–145)
Total Bilirubin: 0.4 mg/dL (ref 0.3–1.2)
Total Protein: 5.9 g/dL — ABNORMAL LOW (ref 6.5–8.1)

## 2019-04-22 NOTE — Progress Notes (Signed)
[place] OFFICE PROGRESS NOTE   Diagnosis: Gastrointestinal stromal tumor  INTERVAL HISTORY:   Mr. Eichelberger returns as scheduled.  He feels well.  He continues [place].  He underwent eye surgery since the last office visit here.  He continues to have tearing and edema at the left eye.  He relates weight loss to no longer eating out.  No diarrhea.  Stable inguinal hernia.  He is working on his property.  Objective:  Vital signs in last 24 hours:  Blood pressure (!) 153/60, pulse 72, temperature 98.2 F (36.8 C), temperature source Oral, resp. rate 18, height 5' 7.5" (1.715 m), weight 163 lb 11.2 oz (74.3 kg), SpO2 100 %.    HEENT: Mild periorbital edema bilaterally GI: No hepatosplenomegaly, no mass, nontender, right inguinal hernia Vascular: No leg edema   Lab Results:  Lab Results  Component Value Date   WBC 4.6 04/22/2019   HGB 10.0 (L) 04/22/2019   HCT 30.3 (L) 04/22/2019   MCV 109.8 (H) 04/22/2019   PLT 177 04/22/2019   NEUTROABS 3.3 04/22/2019    CMP  Lab Results  Component Value Date   NA 143 04/22/2019   K 3.5 04/22/2019   CL 108 04/22/2019   CO2 26 04/22/2019   GLUCOSE 124 (H) 04/22/2019   BUN 25 (H) 04/22/2019   CREATININE 1.72 (H) 04/22/2019   CALCIUM 8.1 (L) 04/22/2019   PROT 5.9 (L) 04/22/2019   ALBUMIN 3.5 04/22/2019   AST 41 04/22/2019   ALT 22 04/22/2019   ALKPHOS 54 04/22/2019   BILITOT 0.4 04/22/2019   GFRNONAA 35 (L) 04/22/2019   GFRAA 41 (L) 04/22/2019    Medications: I have reviewed the patient's current medications.   Assessment/Plan: 1. 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  CT 10/16/2014 consistent with extensive carcinomatosis, CT-guided biopsy of an omental mass 10/21/2014 confirmed a gastrointestinal stromal tumor  Initiation of Gleevec 10/31/2014  CT 01/13/2015 revealed improvement in the peritoneal and omental metastatic disease  CT 05/05/2015 with no  progression of omental/peritoneal metastatic disease, increased ascites, and appendix inflammation  CT 07/07/2016-no abscess, mild perihepatic and left pericolic ascites and mesenteric edema  CT of the pelvis 09/20/2016, no residual abscess, no ascites  CT abdomen pelvis renal stones study 04/03/2017-partial small bowel obstruction, potentially related to right inguinal hernia  CT abdomen/pelvis 08/18/2017-possible cholecystitis, 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 History of anemia, status post a nondiagnostic bone marrow biopsy 10/21/2014  3. NSTEMI March 2014  4. Admission 05/06/2015 with acute onset right abdomen pain-potentially related to acute appendicitis versus pain from carcinomatosis  CT 05/13/2015 consistent with a right lower abdomen abscess, status post catheter drainage by interventional radiology 05/13/2015  Follow-up CT 05/19/2015 showed resolution of the abscess.  Follow-up evaluation in interventional radiology 06/01/2015 showed the abscess cavity had resolved. The abscess drainage catheter communicated with the cecum via the appendix. The catheter was left in place.  CT abdomen/pelvis 06/08/2015 showed the right pelvic drain in place without recurrent or residual surrounding fluid collection. Peritoneal metastasis with slight increase in small to moderate volume of abdominal pelvic ascites.  Drainage catheter injection 06/12/2015 showed residual tiny fistulous connection with the end of the decompressed abscess cavity within the right lower abdominal quadrant and the residual lumen of the appendix.  Paracentesis 06/12/2015 with 5 liters of fluid  removed  Paracentesis 06/19/2015-negative cytology and culture  Removal of abscess drainage catheter 06/23/2015  CT 07/26/2015 with  increased right abdominal wall inflammation and increased fluid collection at site of previous right abdomen abscess, ascites, and enlargement/inflammation of the appendix  Placement of right lower quadrant percutaneous drainage catheter 07/27/2015. Contrast injection confirmed persistent fistulous connection with the ill-defined fluid collection and the residual appendix and cecum. Paracentesis with 2 L of fluid removed.  Follow-up CT abdomen/pelvis 08/05/2015 with small to moderate left pleural effusion and moderate to large volume ascites with both appearing slightly decreased since the prior study. Right lower quadrant drainage catheter in place. 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 Drainage catheter left in place.  Tube evaluation 02/10/2016, persistent fistula  Tube evaluation 03/01/2016, continued fistula to the cecum  Tube downsized 05/04/2016, no residual abscess seen  Collapsed abscess with a stable fistula to the cecum on imaging 06/16/2016  Tube removed 07/26/2016  Recurrent drainage from the right lower quadrant tube site August 2018, status post antibiotics with improvement, drainage again November 2018-antibiotic prescribed by Dr. [name]   5. C. difficile colitis 05/10/2015, Lamount Cranker 2019  6. Right leg edema 12/18/2015. Negative venous Doppler.  7.  Renal insufficiency   Disposition: Mr. Mollenkopf remains in clinical remission from the gastrointestinal stromal tumor.  He will continue Gleevec.  He has chronic renal insufficiency.  The creatinine is higher today.  We will recommend a repeat chemistry panel here or with his primary physician in 1 month.   Betsy Coder, MD  04/22/2019  8:43 AM

## 2019-04-22 NOTE — Telephone Encounter (Signed)
Notified patient that his creatinine is higher this visit and MD wants to recheck BMP in 1 month here or with his PCP, whichever he prefers. He prefers to check it with Dr. Olen Pel, his new PCP still at [facility]. Called [facility] and was given fax # to send order to his assistant att: Angie. She states Dr. Olen Pel does not usually perform outside labs, but his assistant will clear it with him.

## 2019-04-22 NOTE — Telephone Encounter (Signed)
Scheduled appt per 5/4 los.  A calendar will be mailed out

## 2019-04-25 ENCOUNTER — Telehealth: Payer: Self-pay | Admitting: *Deleted

## 2019-04-25 DIAGNOSIS — C49A3 Gastrointestinal stromal tumor of small intestine: Secondary | ICD-10-CM

## 2019-04-25 NOTE — Telephone Encounter (Signed)
Dr. Doyle Askew office will not draw outside labs. Called patient and left VM w/this information and notified him a scheduler will be calling him for lab in 1 month.

## 2019-04-26 ENCOUNTER — Telehealth: Payer: Self-pay | Admitting: Oncology

## 2019-04-26 NOTE — Telephone Encounter (Signed)
Scheduled lab per sch msg. Called and spoke with patients wife. Confirmed date and time

## 2019-04-29 MED FILL — GLEEVEC 400 MG TABLET: 400 | 30 days supply | Qty: 30 | Fill #4

## 2019-05-27 ENCOUNTER — Other Ambulatory Visit: Payer: Self-pay

## 2019-05-27 ENCOUNTER — Telehealth: Payer: Self-pay

## 2019-05-27 ENCOUNTER — Inpatient Hospital Stay: Payer: Medicare Other | Attending: Oncology

## 2019-05-27 DIAGNOSIS — C786 Secondary malignant neoplasm of retroperitoneum and peritoneum: Secondary | ICD-10-CM | POA: Diagnosis not present

## 2019-05-27 DIAGNOSIS — I252 Old myocardial infarction: Secondary | ICD-10-CM | POA: Diagnosis not present

## 2019-05-27 DIAGNOSIS — N189 Chronic kidney disease, unspecified: Secondary | ICD-10-CM | POA: Insufficient documentation

## 2019-05-27 DIAGNOSIS — C49A3 Gastrointestinal stromal tumor of small intestine: Secondary | ICD-10-CM

## 2019-05-27 DIAGNOSIS — Z9049 Acquired absence of other specified parts of digestive tract: Secondary | ICD-10-CM | POA: Diagnosis not present

## 2019-05-27 DIAGNOSIS — Z79899 Other long term (current) drug therapy: Secondary | ICD-10-CM | POA: Diagnosis not present

## 2019-05-27 LAB — BASIC METABOLIC PANEL - CANCER CENTER ONLY
Anion gap: 8 (ref 5–15)
BUN: 17 mg/dL (ref 8–23)
CO2: 26 mmol/L (ref 22–32)
Calcium: 8.4 mg/dL — ABNORMAL LOW (ref 8.9–10.3)
Chloride: 109 mmol/L (ref 98–111)
Creatinine: 1.36 mg/dL — ABNORMAL HIGH (ref 0.61–1.24)
GFR, Est AFR Am: 53 mL/min — ABNORMAL LOW (ref >60)
GFR, Estimated: 46 mL/min — ABNORMAL LOW (ref >60)
Glucose, Bld: 100 mg/dL — ABNORMAL HIGH (ref 70–99)
Potassium: 4.2 mmol/L (ref 3.5–5.1)
Sodium: 143 mmol/L (ref 135–145)

## 2019-05-27 MED FILL — GLEEVEC 400 MG TABLET: 400 | 30 days supply | Qty: 30 | Fill #5

## 2019-05-27 NOTE — Telephone Encounter (Signed)
Spoke with pt advised per Dr [name] kidney function appears normal and is at baseline, f/u as scheduled. Pt verbalized understanding.

## 2019-05-27 NOTE — Telephone Encounter (Signed)
-----   Message from [name] Pier, MD sent at 05/27/2019  1:38 PM EDT ----- Please call patient, kidney function appears improved and is at baseline, follow-up as scheduled

## 2019-06-18 ENCOUNTER — Other Ambulatory Visit: Payer: Self-pay | Admitting: Oncology

## 2019-06-27 MED FILL — GLEEVEC 400 MG TABLET: 400 | 30 days supply | Qty: 30 | Fill #0

## 2019-07-05 DIAGNOSIS — Z961 Presence of intraocular lens: Secondary | ICD-10-CM | POA: Diagnosis not present

## 2019-07-22 ENCOUNTER — Telehealth: Payer: Self-pay

## 2019-07-22 ENCOUNTER — Inpatient Hospital Stay: Payer: Medicare Other | Attending: Oncology

## 2019-07-22 ENCOUNTER — Other Ambulatory Visit: Payer: Self-pay

## 2019-07-22 ENCOUNTER — Inpatient Hospital Stay (HOSPITAL_BASED_OUTPATIENT_CLINIC_OR_DEPARTMENT_OTHER): Payer: Medicare Other | Admitting: Oncology

## 2019-07-22 ENCOUNTER — Telehealth: Payer: Self-pay | Admitting: Oncology

## 2019-07-22 VITALS — BP 142/64 | HR 64 | Temp 98.5°F | Resp 17 | Ht 67.5 in | Wt 165.5 lb

## 2019-07-22 DIAGNOSIS — C49A3 Gastrointestinal stromal tumor of small intestine: Secondary | ICD-10-CM | POA: Insufficient documentation

## 2019-07-22 DIAGNOSIS — C786 Secondary malignant neoplasm of retroperitoneum and peritoneum: Secondary | ICD-10-CM | POA: Insufficient documentation

## 2019-07-22 DIAGNOSIS — Z79899 Other long term (current) drug therapy: Secondary | ICD-10-CM | POA: Diagnosis not present

## 2019-07-22 LAB — CBC WITH DIFFERENTIAL (CANCER CENTER ONLY)
Abs Immature Granulocytes: 0.01 10*3/uL (ref 0.00–0.07)
Basophils Absolute: 0 10*3/uL (ref 0.0–0.1)
Basophils Relative: 0 %
Eosinophils Absolute: 0.1 10*3/uL (ref 0.0–0.5)
Eosinophils Relative: 1 %
HCT: 31.4 % — ABNORMAL LOW (ref 39.0–52.0)
Hemoglobin: 10.5 g/dL — ABNORMAL LOW (ref 13.0–17.0)
Immature Granulocytes: 0 %
Lymphocytes Relative: 20 %
Lymphs Abs: 1 10*3/uL (ref 0.7–4.0)
MCH: 35.8 pg — ABNORMAL HIGH (ref 26.0–34.0)
MCHC: 33.4 g/dL (ref 30.0–36.0)
MCV: 107.2 fL — ABNORMAL HIGH (ref 80.0–100.0)
Monocytes Absolute: 0.5 10*3/uL (ref 0.1–1.0)
Monocytes Relative: 10 %
Neutro Abs: 3.3 10*3/uL (ref 1.7–7.7)
Neutrophils Relative %: 69 %
Platelet Count: 186 10*3/uL (ref 150–400)
RBC: 2.93 MIL/uL — ABNORMAL LOW (ref 4.22–5.81)
RDW: 12.7 % (ref 11.5–15.5)
WBC Count: 4.9 10*3/uL (ref 4.0–10.5)
nRBC: 0 % (ref 0.0–0.2)

## 2019-07-22 LAB — CMP (CANCER CENTER ONLY)
ALT: 17 U/L (ref 0–44)
AST: 22 U/L (ref 15–41)
Albumin: 3.6 g/dL (ref 3.5–5.0)
Alkaline Phosphatase: 48 U/L (ref 38–126)
Anion gap: 7 (ref 5–15)
BUN: 18 mg/dL (ref 8–23)
CO2: 27 mmol/L (ref 22–32)
Calcium: 8.6 mg/dL — ABNORMAL LOW (ref 8.9–10.3)
Chloride: 109 mmol/L (ref 98–111)
Creatinine: 1.44 mg/dL — ABNORMAL HIGH (ref 0.61–1.24)
GFR, Est AFR Am: 50 mL/min — ABNORMAL LOW (ref >60)
GFR, Estimated: 43 mL/min — ABNORMAL LOW (ref >60)
Glucose, Bld: 107 mg/dL — ABNORMAL HIGH (ref 70–99)
Potassium: 4 mmol/L (ref 3.5–5.1)
Sodium: 143 mmol/L (ref 135–145)
Total Bilirubin: 0.5 mg/dL (ref 0.3–1.2)
Total Protein: 6.3 g/dL — ABNORMAL LOW (ref 6.5–8.1)

## 2019-07-22 NOTE — Telephone Encounter (Signed)
TC to patient per Dr [name] to let him know that his potassium is normal, creatinine is stable, and to follow-up as scheduled. Patient verbalized understanding. No further problems or concerns at this time.

## 2019-07-22 NOTE — Progress Notes (Signed)
[name]   Diagnosis: Gastrointestinal stromal tumor  INTERVAL HISTORY:   [name] returns as scheduled.  He continues [name].  He feels well.  He has a persistent right inguinal hernia.  This is nonpainful.  No new complaint.  Objective:  Vital signs in last 24 hours:  Blood pressure (!) 142/64, pulse 64, temperature 98.5 F (36.9 C), temperature source Oral, resp. rate 17, height 5' 7.5" (1.715 m), weight 165 lb 8 oz (75.1 kg), SpO2 100 %.   Limited physical examination secondary to distancing with the COVID pandemic Lymphatics: No cervical, supraclavicular, axillary, or inguinal nodes GI: No hepatosplenomegaly, no mass, nontender, right inguinal hernia extending into the right scrotum Vascular: No leg edema, the right lower leg is larger than the left side Skin: No rash    Lab Results:  Lab Results  Component Value Date   WBC 4.9 07/22/2019   HGB 10.5 (L) 07/22/2019   HCT 31.4 (L) 07/22/2019   MCV 107.2 (H) 07/22/2019   PLT 186 07/22/2019   NEUTROABS 3.3 07/22/2019    CMP  Lab Results  Component Value Date   NA 143 05/27/2019   K 4.2 05/27/2019   CL 109 05/27/2019   CO2 26 05/27/2019   GLUCOSE 100 (H) 05/27/2019   BUN 17 05/27/2019   CREATININE 1.36 (H) 05/27/2019   CALCIUM 8.4 (L) 05/27/2019   PROT 5.9 (L) 04/22/2019   ALBUMIN 3.5 04/22/2019   AST 41 04/22/2019   ALT 22 04/22/2019   ALKPHOS 54 04/22/2019   BILITOT 0.4 04/22/2019   GFRNONAA 46 (L) 05/27/2019   GFRAA 53 (L) 05/27/2019    No results found for: CEA1  Lab Results  Component Value Date   INR 1.17 07/26/2015    Imaging:  No results found.  Medications: I have reviewed the patient's current medications.   Assessment/Plan: . 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  CT 10/16/2014 consistent with extensive carcinomatosis, CT-guided biopsy of an omental mass 10/21/2014 confirmed a  gastrointestinal stromal tumor  Initiation of Gleevec 10/31/2014  CT 01/13/2015 revealed improvement in the peritoneal and omental metastatic disease  CT 05/05/2015 with no progression of omental/peritoneal metastatic disease, increased ascites, and appendix inflammation  CT 07/07/2016-no abscess, mild perihepatic and left pericolic ascites and mesenteric edema  CT of the pelvis 09/20/2016, no residual abscess, no ascites  CT abdomen pelvis renal stones study 04/03/2017-partial small bowel obstruction, potentially related to right inguinal hernia  CT abdomen/pelvis 08/18/2017-possible cholecystitis, 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 History of anemia, status post a nondiagnostic bone marrow biopsy 10/21/2014  3. NSTEMI March 2014  4. Admission 05/06/2015 with acute onset right abdomen pain-potentially related to acute appendicitis versus pain from carcinomatosis  CT 05/13/2015 consistent with a right lower abdomen abscess, status post catheter drainage by interventional radiology 05/13/2015  Follow-up CT 05/19/2015 showed resolution of the abscess.  Follow-up evaluation in interventional radiology 06/01/2015 showed the abscess cavity had resolved. The abscess drainage catheter communicated with the cecum via the appendix. The catheter was left in place.  CT abdomen/pelvis 06/08/2015 showed the right pelvic drain in place without recurrent or residual surrounding fluid collection. Peritoneal metastasis with slight increase in small to moderate volume of abdominal pelvic ascites.  Drainage catheter injection 06/12/2015 showed residual tiny fistulous connection with the end of  the decompressed abscess cavity within the right lower abdominal quadrant and the residual lumen of the  appendix.  Paracentesis 06/12/2015 with 5 liters of fluid removed  Paracentesis 06/19/2015-negative cytology and culture  Removal of abscess drainage catheter 06/23/2015  CT 07/26/2015 with increased right abdominal wall inflammation and increased fluid collection at site of previous right abdomen abscess, ascites, and enlargement/inflammation of the appendix  Placement of right lower quadrant percutaneous drainage catheter 07/27/2015. Contrast injection confirmed persistent fistulous connection with the ill-defined fluid collection and the residual appendix and cecum. Paracentesis with 2 L of fluid removed.  Follow-up CT abdomen/pelvis 08/05/2015 with small to moderate left pleural effusion and moderate to large volume ascites with both appearing slightly decreased since the prior study. Right lower quadrant drainage catheter in place. 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 Drainage catheter left in place.  Tube evaluation 02/10/2016, persistent fistula  Tube evaluation 03/01/2016, continued fistula to the cecum  Tube downsized 05/04/2016, no residual abscess seen  Collapsed abscess with a stable fistula to the cecum on imaging 06/16/2016  Tube removed 07/26/2016  Recurrent drainage from the right lower quadrant tube site August 2018, status post antibiotics with improvement, drainage again November 2018-antibiotic prescribed by Dr. [name]   5. C. difficile colitis 05/10/2015, Lamount Cranker 2019  6. Right leg edema 12/18/2015. Negative venous Doppler.  7.  Renal insufficiency     Disposition: [name] appears unchanged.  He is tolerating the [place] well.  There is no clinical evidence of disease progression.  He will continue Gleevec.  [name] will return for an office and lab visit in 3 months.  Betsy Coder, MD  07/22/2019  8:34 AM

## 2019-07-22 NOTE — Telephone Encounter (Signed)
Called and spoke with patient. Confirmed date and time  °

## 2019-07-26 MED FILL — GLEEVEC 400 MG TABLET: 400 | 30 days supply | Qty: 30 | Fill #1

## 2019-08-15 DIAGNOSIS — E782 Mixed hyperlipidemia: Secondary | ICD-10-CM | POA: Diagnosis not present

## 2019-08-15 DIAGNOSIS — Z0000 Encounter for general adult medical examination without abnormal findings: Secondary | ICD-10-CM | POA: Diagnosis not present

## 2019-08-15 DIAGNOSIS — Z23 Encounter for immunization: Secondary | ICD-10-CM | POA: Diagnosis not present

## 2019-08-15 DIAGNOSIS — N183 Chronic kidney disease, stage 3 (moderate): Secondary | ICD-10-CM | POA: Diagnosis not present

## 2019-08-15 DIAGNOSIS — C49A3 Gastrointestinal stromal tumor of small intestine: Secondary | ICD-10-CM | POA: Diagnosis not present

## 2019-08-15 DIAGNOSIS — I10 Essential (primary) hypertension: Secondary | ICD-10-CM | POA: Diagnosis not present

## 2019-08-15 DIAGNOSIS — Z862 Personal history of diseases of the blood and blood-forming organs and certain disorders involving the immune mechanism: Secondary | ICD-10-CM | POA: Diagnosis not present

## 2019-08-15 DIAGNOSIS — I700 Atherosclerosis of aorta: Secondary | ICD-10-CM | POA: Diagnosis not present

## 2019-08-27 MED FILL — GLEEVEC 400 MG TABLET: 400 | 30 days supply | Qty: 30 | Fill #2

## 2019-09-27 MED FILL — GLEEVEC 400 MG TABLET: 400 | 30 days supply | Qty: 30 | Fill #3

## 2019-10-01 ENCOUNTER — Telehealth: Payer: Self-pay

## 2019-10-01 NOTE — Telephone Encounter (Signed)
Oral Oncology Patient Advocate Encounter  [place] notified me that the patients grant has ran out of money.  I called the patient and he was aware that this would happen this month and is prepared to pay the copay.  I gave the patient information about the [employer] patient assistance program and he agreed to apply for the program. I submitted the application to [facility] on [id number].  [employer] sent me a notification today that the benefit investigation has been completed. I called [employer] and requested that the application be sent over to the [employer] patient assistance program. The representative, Marjorie Smolder stated that she submitted the request and we should expect to hear from [employer] in 2-3 business days.  This encounter will be updated until final determination.   [place] Patient [place] Phone [phone] Fax [phone] 10/01/2019   10:06 AM

## 2019-10-04 NOTE — Telephone Encounter (Signed)
Oral Oncology Patient Advocate Encounter  Received notification from [employer] Patient Assistance program that patient has been successfully enrolled into their program to receive Gleevec from the manufacturer at $0 out of pocket until 12/19/19.    I called and spoke with the patients wife and asked her to have the patient call me back.   Oral Oncology Clinic will continue to follow.  [employer] ph # [phone] Gleevec will be filled at [facility] by [place] Patient [place] Phone [phone] Fax [phone] 10/04/2019    1:26 PM

## 2019-10-17 ENCOUNTER — Telehealth: Payer: Self-pay | Admitting: Pharmacist

## 2019-10-17 DIAGNOSIS — C49A3 Gastrointestinal stromal tumor of small intestine: Secondary | ICD-10-CM

## 2019-10-17 MED ORDER — GLEEVEC 400 MG PO TABS: 400.0000 mg | ORAL_TABLET | Freq: Every day | ORAL | 5 refills | Status: DC

## 2019-10-17 NOTE — Telephone Encounter (Signed)
Oral Oncology Pharmacist Encounter  Oral oncology patient advocate was successful in securing foundation copayment grant to cover out of pocket expenses for imatinib at the pharmacy. Patient is currently receiving his medication through [employer] assistance program and will continue to do so until the end of the 2020 calendar year. Patient will start filling imatinib at the [facility] in Jan 2021. New prescription sent to the pharmacy today. Patient does not have follow-up scheduled with Dr. Learta Codding. I will plan to call the patient mid-Nov to perform follow-up counseling and assessment and to discuss details of continued medication acquisition.  Johny Drilling, PharmD, BCPS, BCOP 10/29/20203:33 PM  Oral Oncology Clinic [phone]

## 2019-10-22 ENCOUNTER — Inpatient Hospital Stay: Payer: Medicare Other | Attending: Oncology

## 2019-10-22 ENCOUNTER — Telehealth: Payer: Self-pay | Admitting: *Deleted

## 2019-10-22 ENCOUNTER — Other Ambulatory Visit: Payer: Self-pay

## 2019-10-22 ENCOUNTER — Telehealth: Payer: Self-pay | Admitting: Oncology

## 2019-10-22 DIAGNOSIS — Z9049 Acquired absence of other specified parts of digestive tract: Secondary | ICD-10-CM | POA: Diagnosis not present

## 2019-10-22 DIAGNOSIS — C49A3 Gastrointestinal stromal tumor of small intestine: Secondary | ICD-10-CM | POA: Insufficient documentation

## 2019-10-22 DIAGNOSIS — C786 Secondary malignant neoplasm of retroperitoneum and peritoneum: Secondary | ICD-10-CM | POA: Diagnosis not present

## 2019-10-22 LAB — CBC WITH DIFFERENTIAL (CANCER CENTER ONLY)
Abs Immature Granulocytes: 0.01 10*3/uL (ref 0.00–0.07)
Basophils Absolute: 0 10*3/uL (ref 0.0–0.1)
Basophils Relative: 0 %
Eosinophils Absolute: 0.1 10*3/uL (ref 0.0–0.5)
Eosinophils Relative: 1 %
HCT: 30.1 % — ABNORMAL LOW (ref 39.0–52.0)
Hemoglobin: 10.1 g/dL — ABNORMAL LOW (ref 13.0–17.0)
Immature Granulocytes: 0 %
Lymphocytes Relative: 22 %
Lymphs Abs: 1 10*3/uL (ref 0.7–4.0)
MCH: 36.6 pg — ABNORMAL HIGH (ref 26.0–34.0)
MCHC: 33.6 g/dL (ref 30.0–36.0)
MCV: 109.1 fL — ABNORMAL HIGH (ref 80.0–100.0)
Monocytes Absolute: 0.4 10*3/uL (ref 0.1–1.0)
Monocytes Relative: 10 %
Neutro Abs: 2.9 10*3/uL (ref 1.7–7.7)
Neutrophils Relative %: 67 %
Platelet Count: 194 10*3/uL (ref 150–400)
RBC: 2.76 MIL/uL — ABNORMAL LOW (ref 4.22–5.81)
RDW: 12.8 % (ref 11.5–15.5)
WBC Count: 4.3 10*3/uL (ref 4.0–10.5)
nRBC: 0 % (ref 0.0–0.2)

## 2019-10-22 LAB — CMP (CANCER CENTER ONLY)
ALT: 14 U/L (ref 0–44)
AST: 20 U/L (ref 15–41)
Albumin: 3.6 g/dL (ref 3.5–5.0)
Alkaline Phosphatase: 46 U/L (ref 38–126)
Anion gap: 6 (ref 5–15)
BUN: 21 mg/dL (ref 8–23)
CO2: 28 mmol/L (ref 22–32)
Calcium: 8.6 mg/dL — ABNORMAL LOW (ref 8.9–10.3)
Chloride: 107 mmol/L (ref 98–111)
Creatinine: 1.62 mg/dL — ABNORMAL HIGH (ref 0.61–1.24)
GFR, Est AFR Am: 43 mL/min — ABNORMAL LOW (ref >60)
GFR, Estimated: 37 mL/min — ABNORMAL LOW (ref >60)
Glucose, Bld: 109 mg/dL — ABNORMAL HIGH (ref 70–99)
Potassium: 3.8 mmol/L (ref 3.5–5.1)
Sodium: 141 mmol/L (ref 135–145)
Total Bilirubin: 0.3 mg/dL (ref 0.3–1.2)
Total Protein: 6.2 g/dL — ABNORMAL LOW (ref 6.5–8.1)

## 2019-10-22 NOTE — Telephone Encounter (Signed)
Scheduled appt per 1/13 sch message - unable to reach pt . Left message with appt date and time   

## 2019-10-22 NOTE — Telephone Encounter (Addendum)
Left VM for patient to return call re: lab results. Scheduling message sent for lab/OV in 2-3 months. [name] [name] returned call and was informed of labs and to continue Gleevec of 400 mg daily. He already has his appointment for January.

## 2019-10-22 NOTE — Telephone Encounter (Signed)
-----   Message from Ladell Pier, MD sent at 10/22/2019  1:40 PM EST ----- Please call patient, labs are stable, continue [place], he was supposed to be scheduled for an office and lab visit, no office visit was scheduled, scheduled for visit with CBC, CMP in 2-3 months

## 2019-11-11 ENCOUNTER — Telehealth: Payer: Self-pay | Admitting: Pharmacist

## 2019-11-11 NOTE — Telephone Encounter (Signed)
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   Pt reports the following side effects: No new complaints  Pertinent labs reviewed: Okay for continued treatment.  Other Issues: Patient with specific questions about [place] insurance authorization renewal with [facility] Medicare part D as well as copayment coverage from [employer] and [employer]).  Patient informed insurance authorization from [facility] is good through 12/19/2019 and will be renewed closer to that time  Patient reminded he is receiving his Gleevec through [employer] patient assistance program until 12/19/2019  Patient will receive his [place] from the [facility] starting in January 2021 with his copayments covered from a foundation copayment grant from [employer].   Patient informed that oral oncology patient advocate, Margy Clarks, is no longer with this office. Her replacement will start at the beginning of December. Patient also informed that I will no longer be with this office after 11/22/2019, and that his insurance authorization will be handled by our replacements. All questions answered. Patient knows to call the office with questions or concerns.  Johny Drilling, PharmD, BCPS, BCOP  11/11/2019 9:31 AM Oral Oncology Clinic [phone]

## 2019-11-21 DIAGNOSIS — Z8509 Personal history of malignant neoplasm of other digestive organs: Secondary | ICD-10-CM | POA: Diagnosis not present

## 2019-11-21 DIAGNOSIS — I2510 Atherosclerotic heart disease of native coronary artery without angina pectoris: Secondary | ICD-10-CM | POA: Diagnosis not present

## 2019-11-21 DIAGNOSIS — K4090 Unilateral inguinal hernia, without obstruction or gangrene, not specified as recurrent: Secondary | ICD-10-CM | POA: Diagnosis not present

## 2019-11-25 DIAGNOSIS — H10503 Unspecified blepharoconjunctivitis, bilateral: Secondary | ICD-10-CM | POA: Diagnosis not present

## 2019-11-28 ENCOUNTER — Telehealth: Payer: Self-pay

## 2019-11-28 NOTE — Telephone Encounter (Signed)
Oral Oncology Patient Advocate Encounter  Received notification from patient's son that [facility] had sent patient a letter stating  that prior authorization for [place] is required.  PA submitted on CoverMyMeds Key [id number] Status is pending  Oral Oncology Clinic will continue to follow.  [place] Patient [place] Phone [phone]  Fax [phone]  11/28/2019 2:20 PM

## 2019-12-24 ENCOUNTER — Other Ambulatory Visit: Payer: Self-pay

## 2019-12-24 ENCOUNTER — Inpatient Hospital Stay: Payer: Medicare Other

## 2019-12-24 ENCOUNTER — Inpatient Hospital Stay: Payer: Medicare Other | Attending: Oncology | Admitting: Oncology

## 2019-12-24 VITALS — BP 169/61 | HR 79 | Temp 98.5°F | Resp 16 | Ht 67.5 in | Wt 162.0 lb

## 2019-12-24 DIAGNOSIS — R0981 Nasal congestion: Secondary | ICD-10-CM | POA: Diagnosis not present

## 2019-12-24 DIAGNOSIS — N289 Disorder of kidney and ureter, unspecified: Secondary | ICD-10-CM | POA: Diagnosis not present

## 2019-12-24 DIAGNOSIS — I252 Old myocardial infarction: Secondary | ICD-10-CM | POA: Insufficient documentation

## 2019-12-24 DIAGNOSIS — K4090 Unilateral inguinal hernia, without obstruction or gangrene, not specified as recurrent: Secondary | ICD-10-CM | POA: Insufficient documentation

## 2019-12-24 DIAGNOSIS — Z79899 Other long term (current) drug therapy: Secondary | ICD-10-CM | POA: Diagnosis not present

## 2019-12-24 DIAGNOSIS — C49A3 Gastrointestinal stromal tumor of small intestine: Secondary | ICD-10-CM | POA: Diagnosis present

## 2019-12-24 DIAGNOSIS — C786 Secondary malignant neoplasm of retroperitoneum and peritoneum: Secondary | ICD-10-CM | POA: Insufficient documentation

## 2019-12-24 LAB — CBC WITH DIFFERENTIAL (CANCER CENTER ONLY)
Abs Immature Granulocytes: 0.01 10*3/uL (ref 0.00–0.07)
Basophils Absolute: 0 10*3/uL (ref 0.0–0.1)
Basophils Relative: 0 %
Eosinophils Absolute: 0 10*3/uL (ref 0.0–0.5)
Eosinophils Relative: 1 %
HCT: 33.8 % — ABNORMAL LOW (ref 39.0–52.0)
Hemoglobin: 11.2 g/dL — ABNORMAL LOW (ref 13.0–17.0)
Immature Granulocytes: 0 %
Lymphocytes Relative: 16 %
Lymphs Abs: 0.9 10*3/uL (ref 0.7–4.0)
MCH: 35.9 pg — ABNORMAL HIGH (ref 26.0–34.0)
MCHC: 33.1 g/dL (ref 30.0–36.0)
MCV: 108.3 fL — ABNORMAL HIGH (ref 80.0–100.0)
Monocytes Absolute: 0.5 10*3/uL (ref 0.1–1.0)
Monocytes Relative: 8 %
Neutro Abs: 4.2 10*3/uL (ref 1.7–7.7)
Neutrophils Relative %: 75 %
Platelet Count: 213 10*3/uL (ref 150–400)
RBC: 3.12 MIL/uL — ABNORMAL LOW (ref 4.22–5.81)
RDW: 12.5 % (ref 11.5–15.5)
WBC Count: 5.6 10*3/uL (ref 4.0–10.5)
nRBC: 0 % (ref 0.0–0.2)

## 2019-12-24 LAB — CMP (CANCER CENTER ONLY)
ALT: 18 U/L (ref 0–44)
AST: 24 U/L (ref 15–41)
Albumin: 3.9 g/dL (ref 3.5–5.0)
Alkaline Phosphatase: 55 U/L (ref 38–126)
Anion gap: 9 (ref 5–15)
BUN: 20 mg/dL (ref 8–23)
CO2: 28 mmol/L (ref 22–32)
Calcium: 8.6 mg/dL — ABNORMAL LOW (ref 8.9–10.3)
Chloride: 107 mmol/L (ref 98–111)
Creatinine: 1.52 mg/dL — ABNORMAL HIGH (ref 0.61–1.24)
GFR, Est AFR Am: 47 mL/min — ABNORMAL LOW (ref >60)
GFR, Estimated: 40 mL/min — ABNORMAL LOW (ref >60)
Glucose, Bld: 112 mg/dL — ABNORMAL HIGH (ref 70–99)
Potassium: 4.5 mmol/L (ref 3.5–5.1)
Sodium: 144 mmol/L (ref 135–145)
Total Bilirubin: 0.3 mg/dL (ref 0.3–1.2)
Total Protein: 6.6 g/dL (ref 6.5–8.1)

## 2019-12-24 NOTE — Progress Notes (Signed)
Patient assistance for Gleevec through [employer] expired 12/19/2019. Call to oral chemotherapy office and per Nuala Alpha, PharmD, Wynn Maudlin is working on it.

## 2019-12-24 NOTE — Progress Notes (Signed)
[facility] OFFICE PROGRESS NOTE   Diagnosis: Gastrointestinal stromal tumor  INTERVAL HISTORY:   Mr. Herrington continues [place]. No rash. Occasional loose stool, but no no abdominal pain. Good appetite. Right inguinal hernia and is unchanged is not painful. His only complaint is "sinus "congestion.  Objective:  Vital signs in last 24 hours:  Blood pressure (!) 169/61, pulse 79, temperature 98.5 F (36.9 C), temperature source Temporal, resp. rate 16, height 5' 7.5" (1.715 m), weight 162 lb (73.5 kg), SpO2 100 %.    Resp: Lungs clear bilaterally Cardio: Regular rhythm with premature beats GI: No hepatomegaly, no mass, right inguinal hernia Vascular: No leg edema  Skin: No rash   Lab Results:  Lab Results  Component Value Date   WBC 5.6 12/24/2019   HGB 11.2 (L) 12/24/2019   HCT 33.8 (L) 12/24/2019   MCV 108.3 (H) 12/24/2019   PLT 213 12/24/2019   NEUTROABS 4.2 12/24/2019    CMP  Lab Results  Component Value Date   NA 144 12/24/2019   K 4.5 12/24/2019   CL 107 12/24/2019   CO2 28 12/24/2019   GLUCOSE 112 (H) 12/24/2019   BUN 20 12/24/2019   CREATININE 1.52 (H) 12/24/2019   CALCIUM 8.6 (L) 12/24/2019   PROT 6.6 12/24/2019   ALBUMIN 3.9 12/24/2019   AST 24 12/24/2019   ALT 18 12/24/2019   ALKPHOS 55 12/24/2019   BILITOT 0.3 12/24/2019   GFRNONAA 40 (L) 12/24/2019   GFRAA 47 (L) 12/24/2019    Medications: I have reviewed the patient's current medications.   Assessment/Plan: 1. 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  CT 10/16/2014 consistent with extensive carcinomatosis, CT-guided biopsy of an omental mass 10/21/2014 confirmed a gastrointestinal stromal tumor  Initiation of Gleevec 10/31/2014  CT 01/13/2015 revealed improvement in the peritoneal and omental metastatic disease  CT 05/05/2015 with no progression of omental/peritoneal metastatic disease, increased ascites, and appendix  inflammation  CT 07/07/2016-no abscess, mild perihepatic and left pericolic ascites and mesenteric edema  CT of the pelvis 09/20/2016, no residual abscess, no ascites  CT abdomen pelvis renal stones study 04/03/2017-partial small bowel obstruction, potentially related to right inguinal hernia  CT abdomen/pelvis 08/18/2017-possible cholecystitis, 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 History of anemia, status post a nondiagnostic bone marrow biopsy 10/21/2014  3. NSTEMI March 2014  4. Admission 05/06/2015 with acute onset right abdomen pain-potentially related to acute appendicitis versus pain from carcinomatosis  CT 05/13/2015 consistent with a right lower abdomen abscess, status post catheter drainage by interventional radiology 05/13/2015  Follow-up CT 05/19/2015 showed resolution of the abscess.  Follow-up evaluation in interventional radiology 06/01/2015 showed the abscess cavity had resolved. The abscess drainage catheter communicated with the cecum via the appendix. The catheter was left in place.  CT abdomen/pelvis 06/08/2015 showed the right pelvic drain in place without recurrent or residual surrounding fluid collection. Peritoneal metastasis with slight increase in small to moderate volume of abdominal pelvic ascites.  Drainage catheter injection 06/12/2015 showed residual tiny fistulous connection with the end of the decompressed abscess cavity within the right lower abdominal quadrant and the residual lumen of the appendix.  Paracentesis 06/12/2015 with 5 liters of fluid removed  Paracentesis 06/19/2015-negative cytology and culture  Removal of abscess drainage catheter 06/23/2015  CT 07/26/2015 with increased right abdominal wall inflammation and increased  fluid collection at site of  previous right abdomen abscess, ascites, and enlargement/inflammation of the appendix  Placement of right lower quadrant percutaneous drainage catheter 07/27/2015. Contrast injection confirmed persistent fistulous connection with the ill-defined fluid collection and the residual appendix and cecum. Paracentesis with 2 L of fluid removed.  Follow-up CT abdomen/pelvis 08/05/2015 with small to moderate left pleural effusion and moderate to large volume ascites with both appearing slightly decreased since the prior study. Right lower quadrant drainage catheter in place. 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 Drainage catheter left in place.  Tube evaluation 02/10/2016, persistent fistula  Tube evaluation 03/01/2016, continued fistula to the cecum  Tube downsized 05/04/2016, no residual abscess seen  Collapsed abscess with a stable fistula to the cecum on imaging 06/16/2016  Tube removed 07/26/2016  Recurrent drainage from the right lower quadrant tube site August 2018, status post antibiotics with improvement, drainage again November 2018-antibiotic prescribed by Dr. [name]   5. C. difficile colitis 05/10/2015, [place] Cranker 2019  6. Right leg edema 12/18/2015. Negative venous Doppler.  7.  Renal insufficiency  Disposition: Mr. Schroeter appears unchanged. He continues to tolerate Gleevec well. There is no evidence for progression of the gastrointestinal stromal tumor. He will return for an office and lab visit in 3 months.  Betsy Coder, MD  12/24/2019  4:45 PM

## 2019-12-25 MED FILL — GLEEVEC 400 MG TABLET: 400 | 30 days supply | Qty: 30 | Fill #0

## 2019-12-26 ENCOUNTER — Telehealth: Payer: Self-pay | Admitting: Oncology

## 2019-12-26 NOTE — Telephone Encounter (Signed)
Gave patient his appt time

## 2020-01-12 ENCOUNTER — Ambulatory Visit: Payer: Medicare Other | Attending: Internal Medicine

## 2020-01-12 DIAGNOSIS — Z23 Encounter for immunization: Secondary | ICD-10-CM

## 2020-01-12 NOTE — Progress Notes (Signed)
   Covid-19 Vaccination Clinic  Name:  Quevon Seidl    MRN: [id number] DOB: May 09, 1929  01/12/2020  Mr. Dollinger was observed post Covid-19 immunization for 15 minutes without incidence. He was provided with Vaccine Information Sheet and instruction to access the V-Safe system.   Mr. Malinsky was instructed to call 911 with any severe reactions post vaccine: [name] Difficulty breathing  . Swelling of your face and throat  . A fast heartbeat  . A bad rash all over your body  . Dizziness and weakness    Immunizations Administered    Name Date Dose VIS Date Route   Pfizer COVID-19 Vaccine 01/12/2020 10:56 AM 0.3 mL 11/29/2019 Intramuscular   Manufacturer: [place]   Lot: [id number]   [place]: [id number]

## 2020-01-17 NOTE — Telephone Encounter (Signed)
Oral Oncology Patient Advocate Encounter  Received notification from [facility] that prior authorization for [place] (brnad name) is required.  PA submitted on CoverMyMeds Key [id number] Status is pending  Oral Oncology Clinic will continue to follow.  [place] Patient [place] Phone [phone] Fax [phone] 01/17/2020 2:52 PM

## 2020-01-17 NOTE — Telephone Encounter (Signed)
Oral Oncology Patient Advocate Encounter  Prior Authorization for [place] (brand name) has been approved.    PA# [id number] Effective dates: 01/17/20 through 12/18/20  Patients co-pay is $500  Patient has [employer] grant to cover copay.  Oral Oncology Clinic will continue to follow.   [place] Patient [place] Phone [phone] Fax [phone] 01/17/2020 2:56 PM

## 2020-01-20 MED FILL — GLEEVEC 400 MG TABLET: 400 | 30 days supply | Qty: 30 | Fill #1

## 2020-02-03 ENCOUNTER — Ambulatory Visit: Payer: Medicare Other | Attending: Internal Medicine

## 2020-02-03 DIAGNOSIS — Z23 Encounter for immunization: Secondary | ICD-10-CM | POA: Insufficient documentation

## 2020-02-03 NOTE — Progress Notes (Signed)
   Covid-19 Vaccination Clinic  Name:  [name] [name]    MRN: [id number] DOB: 02-08-1929  02/03/2020  [name] was observed post Covid-19 immunization for 15 minutes without incidence. He was provided with Vaccine Information Sheet and instruction to access the V-Safe system.   [name] was instructed to call 911 with any severe reactions post vaccine: [name] Difficulty breathing  . Swelling of your face and throat  . A fast heartbeat  . A bad rash all over your body  . Dizziness and weakness    Immunizations Administered    Name Date Dose VIS Date Route   Pfizer COVID-19 Vaccine 02/03/2020  8:58 AM 0.3 mL 11/29/2019 Intramuscular   Manufacturer: [place]   Lot: [id number]   [place]: [id number]

## 2020-02-07 ENCOUNTER — Encounter (HOSPITAL_COMMUNITY): Payer: Self-pay

## 2020-02-07 ENCOUNTER — Emergency Department (HOSPITAL_COMMUNITY)
Admission: EM | Admit: 2020-02-07 | Discharge: 2020-02-07 | Disposition: A | Discharge status: Home / Self Care | Payer: Medicare Other | Attending: Emergency Medicine | Admitting: Emergency Medicine

## 2020-02-07 DIAGNOSIS — R300 Dysuria: Secondary | ICD-10-CM | POA: Insufficient documentation

## 2020-02-07 DIAGNOSIS — A070 Balantidiasis: Secondary | ICD-10-CM | POA: Insufficient documentation

## 2020-02-07 DIAGNOSIS — I2510 Atherosclerotic heart disease of native coronary artery without angina pectoris: Secondary | ICD-10-CM | POA: Insufficient documentation

## 2020-02-07 DIAGNOSIS — N481 Balanitis: Secondary | ICD-10-CM

## 2020-02-07 DIAGNOSIS — Z79899 Other long term (current) drug therapy: Secondary | ICD-10-CM | POA: Insufficient documentation

## 2020-02-07 DIAGNOSIS — R339 Retention of urine, unspecified: Secondary | ICD-10-CM | POA: Diagnosis present

## 2020-02-07 DIAGNOSIS — I119 Hypertensive heart disease without heart failure: Secondary | ICD-10-CM | POA: Diagnosis not present

## 2020-02-07 LAB — URINALYSIS, ROUTINE W REFLEX MICROSCOPIC
Bilirubin Urine: NEGATIVE
Glucose, UA: NEGATIVE mg/dL
Hgb urine dipstick: NEGATIVE
Ketones, ur: 5 mg/dL — AB
Leukocytes,Ua: NEGATIVE
Nitrite: NEGATIVE
Protein, ur: 30 mg/dL — AB
Specific Gravity, Urine: 1.019 (ref 1.005–1.030)
pH: 5 (ref 5.0–8.0)

## 2020-02-07 LAB — CBC WITH DIFFERENTIAL/PLATELET
Abs Immature Granulocytes: 0.01 10*3/uL (ref 0.00–0.07)
Basophils Absolute: 0 10*3/uL (ref 0.0–0.1)
Basophils Relative: 0 %
Eosinophils Absolute: 0 10*3/uL (ref 0.0–0.5)
Eosinophils Relative: 1 %
HCT: 31.3 % — ABNORMAL LOW (ref 39.0–52.0)
Hemoglobin: 10.5 g/dL — ABNORMAL LOW (ref 13.0–17.0)
Immature Granulocytes: 0 %
Lymphocytes Relative: 27 %
Lymphs Abs: 0.7 10*3/uL (ref 0.7–4.0)
MCH: 36.5 pg — ABNORMAL HIGH (ref 26.0–34.0)
MCHC: 33.5 g/dL (ref 30.0–36.0)
MCV: 108.7 fL — ABNORMAL HIGH (ref 80.0–100.0)
Monocytes Absolute: 0.2 10*3/uL (ref 0.1–1.0)
Monocytes Relative: 9 %
Neutro Abs: 1.6 10*3/uL — ABNORMAL LOW (ref 1.7–7.7)
Neutrophils Relative %: 63 %
Platelets: 196 10*3/uL (ref 150–400)
RBC: 2.88 MIL/uL — ABNORMAL LOW (ref 4.22–5.81)
RDW: 13 % (ref 11.5–15.5)
WBC: 2.6 10*3/uL — ABNORMAL LOW (ref 4.0–10.5)
nRBC: 0 % (ref 0.0–0.2)

## 2020-02-07 LAB — BASIC METABOLIC PANEL
Anion gap: 9 (ref 5–15)
BUN: 26 mg/dL — ABNORMAL HIGH (ref 8–23)
CO2: 26 mmol/L (ref 22–32)
Calcium: 8.6 mg/dL — ABNORMAL LOW (ref 8.9–10.3)
Chloride: 106 mmol/L (ref 98–111)
Creatinine, Ser: 1.62 mg/dL — ABNORMAL HIGH (ref 0.61–1.24)
GFR calc Af Amer: 43 mL/min — ABNORMAL LOW (ref >60)
GFR calc non Af Amer: 37 mL/min — ABNORMAL LOW (ref >60)
Glucose, Bld: 113 mg/dL — ABNORMAL HIGH (ref 70–99)
Potassium: 3.3 mmol/L — ABNORMAL LOW (ref 3.5–5.1)
Sodium: 141 mmol/L (ref 135–145)

## 2020-02-07 MED ORDER — HYDROCODONE-ACETAMINOPHEN 5-325 MG PO TABS: 2.0000 | ORAL_TABLET | ORAL | 0 refills | Status: DC | PRN

## 2020-02-07 MED ORDER — HYDROCODONE-ACETAMINOPHEN 5-325 MG PO TABS
1.0000 | ORAL_TABLET | Freq: Once | ORAL | Status: AC
  Administered 2020-02-07: 11:00:00 1 via ORAL
  Filled 2020-02-07: qty 1

## 2020-02-07 MED ORDER — CLOTRIMAZOLE 1 % EX CREA: TOPICAL_CREAM | CUTANEOUS | 0 refills | Status: DC

## 2020-02-07 NOTE — Discharge Instructions (Addendum)
Follow-up with your urology appointment as scheduled for next month

## 2020-02-07 NOTE — ED Provider Notes (Addendum)
[facility] DEPT Provider Note   CSN: [id number] Arrival date & time: 02/07/20  [id number]     History Chief Complaint  Patient presents with  . Urinary Retention    [name] [name] is a 86 y.o. male.  86 year old male who presents with dysuria and concern for urinary retention x2 weeks.  Patient states that it burns when he first urinates.  Denies any flank pain.  No fever or chills.  Nausea no vomiting.  Denies any suprapubic pressure.  Has been having issues like this in the past and is scheduled see urology in a few weeks but could not wait.  Called EMS and was transported here        Past Medical History:  Diagnosis Date  . Abdominal mass 02/2013  . Acute respiratory failure with hypoxia ([place]) 09/12/2014  . CAD (coronary artery disease)   . CKD (chronic kidney disease), stage III   . GIST (gastrointestinal stroma tumor), malignant, colon ([place])    [name] 03/13/2017  . Hemorrhagic shock ([place]) 09/13/2014  . Hyperlipidemia    TAKES zOCOR DAILY  . Hypertension    TAKES LOTREL,HCTZ,AND METOPROLOL DAILY  . Intraperitoneal hemorrhage 09/17/2014  . NSTEMI (non-ST elevated myocardial infarction) ([place]) 02/2013   "LIGHT: ONE MD SAID HE DID AND ONE SAID HE DIDN'T  . Pneumonia    MAR 2014  . Shortness of breath   . Stromal tumor of digestive system ([place])   . UTI (urinary tract infection)     Patient Active Problem List   Diagnosis Date Noted  . Partial small bowel obstruction ([place]) 04/03/2017  . Abscess of abdominal cavity ([place])   . Ascites   . Sepsis ([place]) 07/26/2015  . Abdominal abscess   . C. difficile diarrhea 05/10/2015  . Urinary retention 05/07/2015  . CKD (chronic kidney disease), stage III   . SIRS (systemic inflammatory response syndrome) ([place]) 05/06/2015  . Epigastric pain   . Abdominal pain 01/13/2015  . Abdominal carcinomatosis ([place]) 10/16/2014  . Diastolic CHF, acute on chronic (H[facility]) 09/17/2014  . Hyperlipidemia   . CAD (coronary artery  disease)   . Hypertension   . GIST (gastrointestinal stromal tumor) of small bowel, malignant ([place]) 06/20/2013  . Acute kidney failure ([place]) 02/28/2013  . NSTEMI (non-ST elevated myocardial infarction) ([place]) 02/27/2013  . Chronic anemia 02/27/2013    Past Surgical History:  Procedure Laterality Date  . CARDIAC CATHETERIZATION  03-05-13  . COLONOSCOPY  09/08/2014   dr scooler  . CYST EXCISION  1962   wrist  . IR GENERIC HISTORICAL  05/17/2016   IR RADIOLOGIST EVAL & MGMT 05/17/2016 [name], MD [facility] INTERV RAD  . IR GENERIC HISTORICAL  07/26/2016   IR RADIOLOGIST EVAL & MGMT 07/26/2016 [name], MD [facility] INTERV RAD  . LAPAROTOMY N/A 06/04/2013   Procedure: EXPLORATORY LAPAROTOMY, OPEN RESECTION OF MESENTERIC AND INTESTINAL MASS;  Surgeon: [name], MD;  Location: [facility];  Service: General;  Laterality: N/A;  Small Bowel Resection  . LEFT HEART CATHETERIZATION WITH CORONARY ANGIOGRAM N/A 03/05/2013   Procedure: LEFT HEART CATHETERIZATION WITH CORONARY ANGIOGRAM;  Surgeon: [name], MD;  Location: [facility] CATH LAB;  Service: Cardiovascular;  Laterality: N/A;       Family History  Problem Relation Age of Onset  . Stroke Mother   . Skin cancer Father   . Skin cancer Brother     Social History   Tobacco Use  . Smoking status: Never Smoker  . Smokeless tobacco: Never  Used  Substance Use Topics  . Alcohol use: No  . Drug use: No    Home Medications Prior to Admission medications   Medication Sig Start Date End Date Taking? Authorizing Provider  amLODipine (NORVASC) 5 MG tablet Take 5 mg by mouth daily. 03/17/15   [provider]  furosemide (LASIX) 20 MG tablet Take 1 tablet (20 mg total) by mouth daily. 05/20/15   Hosie Poisson, MD  GLEEVEC 400 MG tablet Take 1 tablet (400 mg total) by mouth daily. Take with meals and large glass of water. 10/17/19   Ladell Pier, MD  metoprolol tartrate (LOPRESSOR) 25 MG tablet Take 1 tablet (25 mg total) by mouth  2 (two) times daily. 03/06/13   Caren Griffins, MD  simvastatin (ZOCOR) 20 MG tablet Take 20 mg by mouth every evening.    [provider]    Allergies    Patient has no known allergies.  Review of Systems   Review of Systems  All other systems reviewed and are negative.   Physical Exam Updated Vital Signs BP 137/84 (BP Location: Right Arm)   Pulse 89   Temp 98.4 F (36.9 C) (Oral)   Ht 1.702 m (5\' 7" )   Wt 73.5 kg   SpO2 98%   BMI 25.37 kg/m   Physical Exam Vitals and nursing note reviewed.  Constitutional:      General: He is not in acute distress.    Appearance: Normal appearance. He is well-developed. He is not toxic-appearing.  HENT:     Head: Normocephalic and atraumatic.  Eyes:     General: Lids are normal.     Conjunctiva/sclera: Conjunctivae normal.     Pupils: Pupils are equal, round, and reactive to light.  Neck:     Thyroid: No thyroid mass.     Trachea: No tracheal deviation.  Cardiovascular:     Rate and Rhythm: Normal rate and regular rhythm.     Heart sounds: Normal heart sounds. No murmur. No gallop.   Pulmonary:     Effort: Pulmonary effort is normal. No respiratory distress.     Breath sounds: Normal breath sounds. No stridor. No decreased breath sounds, wheezing, rhonchi or rales.  Abdominal:     General: Bowel sounds are normal. There is no distension.     Palpations: Abdomen is soft.     Tenderness: There is no abdominal tenderness. There is no rebound.  Genitourinary:   Musculoskeletal:        General: No tenderness. Normal range of motion.     Cervical back: Normal range of motion and neck supple.  Skin:    General: Skin is warm and dry.     Findings: No abrasion or rash.  Neurological:     Mental Status: He is alert and oriented to person, place, and time.     GCS: GCS eye subscore is 4. GCS verbal subscore is 5. GCS motor subscore is 6.     Cranial Nerves: No cranial nerve deficit.     Sensory: No sensory deficit.    Psychiatric:        Speech: Speech normal.        Behavior: Behavior normal.     ED Results / Procedures / Treatments   Labs (all labs ordered are listed, but only abnormal results are displayed) Labs Reviewed  URINE CULTURE  URINALYSIS, ROUTINE W REFLEX MICROSCOPIC  BASIC METABOLIC PANEL  CBC WITH DIFFERENTIAL/PLATELET    EKG None  Radiology No results  found.  Procedures Procedures (including critical care time)  Medications Ordered in ED Medications - No data to display  ED Course  I have reviewed the triage vital signs and the nursing notes.  Pertinent labs & imaging results that were available during my care of the patient were reviewed by me and considered in my medical decision making (see chart for details).    MDM Rules/Calculators/A&P                      Patient is urinalysis without infection here.  Labs are reassuring here.  Suspect that patient had balanitis as cause of symptoms.  Will prescribe antifungal cream\   10:44 AM Case discussed with Dr. Alinda Money from urology who agrees with plan recommends patient call the office to schedule sooner follow-up Final Clinical Impression(s) / ED Diagnoses Final diagnoses:  None    Rx / DC Orders ED Discharge Orders    None       Lacretia Leigh, MD 02/07/20 1028    Lacretia Leigh, MD 02/07/20 1045

## 2020-02-07 NOTE — ED Triage Notes (Signed)
86 yo male brought in by [facility] from home c/o urinary retention intermittently for the past 2 weeks. Pt has an appt with urology in March but unable to tolerate discomfort. EMS unaware of PMH. Pt is ambulatory and A/Ox4.   Vitals en route BP136/74 HR94 RR16 Sat 97%

## 2020-02-07 NOTE — ED Notes (Signed)
Patient reports wanting to see MD again before discharge and wanting something for pain.

## 2020-02-07 NOTE — ED Notes (Signed)
Dr. Zenia Resides at bedside. UA sent to lab.

## 2020-02-07 NOTE — ED Notes (Addendum)
Secretary spoke to [facility].   Patient has a new scheduled appointment for Tuesday (2/23)  With Dr. Matilde Sprang @ 1:15.   [phone]  Patient and wife made aware.

## 2020-02-07 NOTE — ED Triage Notes (Signed)
Bladder scanner shows 198 ml. Pt voided 75 ml in urinal. Bladder scanner after voiding shows 113.

## 2020-02-07 NOTE — ED Notes (Signed)
Allen, MD at bedside

## 2020-02-08 LAB — URINE CULTURE: Culture: NO GROWTH

## 2020-02-20 MED FILL — GLEEVEC 400 MG TABLET: 400 | 30 days supply | Qty: 30 | Fill #2

## 2020-03-23 ENCOUNTER — Other Ambulatory Visit: Payer: Self-pay

## 2020-03-23 ENCOUNTER — Inpatient Hospital Stay: Payer: Medicare Other | Attending: Oncology

## 2020-03-23 DIAGNOSIS — C49A3 Gastrointestinal stromal tumor of small intestine: Secondary | ICD-10-CM | POA: Insufficient documentation

## 2020-03-23 DIAGNOSIS — Z9049 Acquired absence of other specified parts of digestive tract: Secondary | ICD-10-CM | POA: Diagnosis not present

## 2020-03-23 DIAGNOSIS — C786 Secondary malignant neoplasm of retroperitoneum and peritoneum: Secondary | ICD-10-CM | POA: Insufficient documentation

## 2020-03-23 LAB — CMP (CANCER CENTER ONLY)
ALT: 16 U/L (ref 0–44)
AST: 23 U/L (ref 15–41)
Albumin: 3.5 g/dL (ref 3.5–5.0)
Alkaline Phosphatase: 49 U/L (ref 38–126)
Anion gap: 8 (ref 5–15)
BUN: 22 mg/dL (ref 8–23)
CO2: 28 mmol/L (ref 22–32)
Calcium: 8.3 mg/dL — ABNORMAL LOW (ref 8.9–10.3)
Chloride: 109 mmol/L (ref 98–111)
Creatinine: 1.65 mg/dL — ABNORMAL HIGH (ref 0.61–1.24)
GFR, Est AFR Am: 42 mL/min — ABNORMAL LOW (ref >60)
GFR, Estimated: 37 mL/min — ABNORMAL LOW (ref >60)
Glucose, Bld: 120 mg/dL — ABNORMAL HIGH (ref 70–99)
Potassium: 3.7 mmol/L (ref 3.5–5.1)
Sodium: 145 mmol/L (ref 135–145)
Total Bilirubin: 0.3 mg/dL (ref 0.3–1.2)
Total Protein: 6.1 g/dL — ABNORMAL LOW (ref 6.5–8.1)

## 2020-03-23 LAB — CBC WITH DIFFERENTIAL (CANCER CENTER ONLY)
Abs Immature Granulocytes: 0.01 10*3/uL (ref 0.00–0.07)
Basophils Absolute: 0 10*3/uL (ref 0.0–0.1)
Basophils Relative: 0 %
Eosinophils Absolute: 0 10*3/uL (ref 0.0–0.5)
Eosinophils Relative: 1 %
HCT: 27.2 % — ABNORMAL LOW (ref 39.0–52.0)
Hemoglobin: 9.1 g/dL — ABNORMAL LOW (ref 13.0–17.0)
Immature Granulocytes: 0 %
Lymphocytes Relative: 23 %
Lymphs Abs: 1.3 10*3/uL (ref 0.7–4.0)
MCH: 36.5 pg — ABNORMAL HIGH (ref 26.0–34.0)
MCHC: 33.5 g/dL (ref 30.0–36.0)
MCV: 109.2 fL — ABNORMAL HIGH (ref 80.0–100.0)
Monocytes Absolute: 0.4 10*3/uL (ref 0.1–1.0)
Monocytes Relative: 8 %
Neutro Abs: 3.6 10*3/uL (ref 1.7–7.7)
Neutrophils Relative %: 68 %
Platelet Count: 176 10*3/uL (ref 150–400)
RBC: 2.49 MIL/uL — ABNORMAL LOW (ref 4.22–5.81)
RDW: 13.3 % (ref 11.5–15.5)
WBC Count: 5.3 10*3/uL (ref 4.0–10.5)
nRBC: 0 % (ref 0.0–0.2)

## 2020-03-24 ENCOUNTER — Telehealth: Payer: Self-pay | Admitting: *Deleted

## 2020-03-24 ENCOUNTER — Telehealth: Payer: Self-pay | Admitting: Oncology

## 2020-03-24 DIAGNOSIS — C49A3 Gastrointestinal stromal tumor of small intestine: Secondary | ICD-10-CM

## 2020-03-24 NOTE — Telephone Encounter (Signed)
Scheduled appt per 4/6 sch message - unable to reach pt . Left message with apt date and time

## 2020-03-24 NOTE — Telephone Encounter (Signed)
Notified of message below. Verbalized understanding. Denies any bleeding or dyspnea.  Message to scheduler

## 2020-03-24 NOTE — Telephone Encounter (Signed)
-----   Message from Ladell Pier, MD sent at 03/23/2020  8:13 PM EDT ----- Please call patient, labs ok , hb slightly lower, call for bleeding or dyspnea, was supposed to have office visit today, schedule office and cbc/cmet 6 weeks

## 2020-03-28 MED FILL — GLEEVEC 400 MG TABLET: 400 | 30 days supply | Qty: 30 | Fill #3

## 2020-04-22 MED FILL — GLEEVEC 400 MG TABLET: 400 | 30 days supply | Qty: 30 | Fill #4

## 2020-05-05 ENCOUNTER — Other Ambulatory Visit: Payer: Self-pay

## 2020-05-05 ENCOUNTER — Inpatient Hospital Stay: Payer: Medicare Other | Attending: Oncology | Admitting: Oncology

## 2020-05-05 ENCOUNTER — Inpatient Hospital Stay: Payer: Medicare Other

## 2020-05-05 VITALS — BP 179/65 | HR 77 | Temp 98.1°F | Resp 18 | Ht 67.0 in | Wt 164.7 lb

## 2020-05-05 DIAGNOSIS — D539 Nutritional anemia, unspecified: Secondary | ICD-10-CM | POA: Diagnosis not present

## 2020-05-05 DIAGNOSIS — C786 Secondary malignant neoplasm of retroperitoneum and peritoneum: Secondary | ICD-10-CM | POA: Insufficient documentation

## 2020-05-05 DIAGNOSIS — D649 Anemia, unspecified: Secondary | ICD-10-CM

## 2020-05-05 DIAGNOSIS — Z79899 Other long term (current) drug therapy: Secondary | ICD-10-CM | POA: Insufficient documentation

## 2020-05-05 DIAGNOSIS — K4090 Unilateral inguinal hernia, without obstruction or gangrene, not specified as recurrent: Secondary | ICD-10-CM | POA: Insufficient documentation

## 2020-05-05 DIAGNOSIS — N289 Disorder of kidney and ureter, unspecified: Secondary | ICD-10-CM | POA: Diagnosis not present

## 2020-05-05 DIAGNOSIS — C49A3 Gastrointestinal stromal tumor of small intestine: Secondary | ICD-10-CM | POA: Insufficient documentation

## 2020-05-05 DIAGNOSIS — I252 Old myocardial infarction: Secondary | ICD-10-CM | POA: Diagnosis not present

## 2020-05-05 DIAGNOSIS — H05229 Edema of unspecified orbit: Secondary | ICD-10-CM | POA: Insufficient documentation

## 2020-05-05 LAB — CBC WITH DIFFERENTIAL (CANCER CENTER ONLY)
Abs Immature Granulocytes: 0.01 10*3/uL (ref 0.00–0.07)
Basophils Absolute: 0 10*3/uL (ref 0.0–0.1)
Basophils Relative: 0 %
Eosinophils Absolute: 0.1 10*3/uL (ref 0.0–0.5)
Eosinophils Relative: 1 %
HCT: 31.5 % — ABNORMAL LOW (ref 39.0–52.0)
Hemoglobin: 10.4 g/dL — ABNORMAL LOW (ref 13.0–17.0)
Immature Granulocytes: 0 %
Lymphocytes Relative: 21 %
Lymphs Abs: 1.3 10*3/uL (ref 0.7–4.0)
MCH: 36.2 pg — ABNORMAL HIGH (ref 26.0–34.0)
MCHC: 33 g/dL (ref 30.0–36.0)
MCV: 109.8 fL — ABNORMAL HIGH (ref 80.0–100.0)
Monocytes Absolute: 0.6 10*3/uL (ref 0.1–1.0)
Monocytes Relative: 10 %
Neutro Abs: 4.2 10*3/uL (ref 1.7–7.7)
Neutrophils Relative %: 68 %
Platelet Count: 201 10*3/uL (ref 150–400)
RBC: 2.87 MIL/uL — ABNORMAL LOW (ref 4.22–5.81)
RDW: 12.7 % (ref 11.5–15.5)
WBC Count: 6.1 10*3/uL (ref 4.0–10.5)
nRBC: 0 % (ref 0.0–0.2)

## 2020-05-05 LAB — CMP (CANCER CENTER ONLY)
ALT: 15 U/L (ref 0–44)
AST: 22 U/L (ref 15–41)
Albumin: 3.7 g/dL (ref 3.5–5.0)
Alkaline Phosphatase: 55 U/L (ref 38–126)
Anion gap: 10 (ref 5–15)
BUN: 20 mg/dL (ref 8–23)
CO2: 25 mmol/L (ref 22–32)
Calcium: 8.4 mg/dL — ABNORMAL LOW (ref 8.9–10.3)
Chloride: 109 mmol/L (ref 98–111)
Creatinine: 1.35 mg/dL — ABNORMAL HIGH (ref 0.61–1.24)
GFR, Est AFR Am: 54 mL/min — ABNORMAL LOW (ref >60)
GFR, Estimated: 46 mL/min — ABNORMAL LOW (ref >60)
Glucose, Bld: 103 mg/dL — ABNORMAL HIGH (ref 70–99)
Potassium: 3.9 mmol/L (ref 3.5–5.1)
Sodium: 144 mmol/L (ref 135–145)
Total Bilirubin: 0.4 mg/dL (ref 0.3–1.2)
Total Protein: 6.7 g/dL (ref 6.5–8.1)

## 2020-05-05 NOTE — Progress Notes (Signed)
[facility] OFFICE PROGRESS NOTE   Diagnosis: Gastrointestinal stromal tumor  INTERVAL HISTORY:   [name] [name] returns as scheduled.  He continues [name] [name].  He feels well.  He had dysuria in February and was seen in the emergency room.  He was diagnosed with balanitis.  He has no complaint today.  Objective:  Vital signs in last 24 hours:  Blood pressure (!) 179/65, pulse 77, temperature 98.1 F (36.7 C), temperature source Temporal, resp. rate 18, height 5' 7"  (1.702 m), weight 164 lb 11.2 oz (74.7 kg), SpO2 100 %.    HEENT: Mild periorbital edema Resp: Lungs clear bilaterally Cardio: Regular rate and rhythm with premature beats GI: No hepatosplenomegaly, no apparent ascites, no mass, nontender, right inguinal hernia extending into the right scrotum.  Right lower abdomen drain site appears healed. Vascular: No leg edema   Lab Results:  Lab Results  Component Value Date   WBC 6.1 05/05/2020   HGB 10.4 (L) 05/05/2020   HCT 31.5 (L) 05/05/2020   MCV 109.8 (H) 05/05/2020   PLT 201 05/05/2020   NEUTROABS 4.2 05/05/2020    CMP  Lab Results  Component Value Date   NA 144 05/05/2020   K 3.9 05/05/2020   CL 109 05/05/2020   CO2 25 05/05/2020   GLUCOSE 103 (H) 05/05/2020   BUN 20 05/05/2020   CREATININE 1.35 (H) 05/05/2020   CALCIUM 8.4 (L) 05/05/2020   PROT 6.7 05/05/2020   ALBUMIN 3.7 05/05/2020   AST 22 05/05/2020   ALT 15 05/05/2020   ALKPHOS 55 05/05/2020   BILITOT 0.4 05/05/2020   GFRNONAA 46 (L) 05/05/2020   GFRAA 54 (L) 05/05/2020    Medications: I have reviewed the patient's current medications.   Assessment/Plan:  1. 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  CT 10/16/2014 consistent with extensive carcinomatosis, CT-guided biopsy of an omental mass 10/21/2014 confirmed a gastrointestinal stromal tumor  Initiation of Gleevec 10/31/2014  CT 01/13/2015 revealed improvement in the peritoneal  and omental metastatic disease  CT 05/05/2015 with no progression of omental/peritoneal metastatic disease, increased ascites, and appendix inflammation  CT 07/07/2016-no abscess, mild perihepatic and left pericolic ascites and mesenteric edema  CT of the pelvis 09/20/2016, no residual abscess, no ascites  CT abdomen pelvis renal stones study 04/03/2017-partial small bowel obstruction, potentially related to right inguinal hernia  CT abdomen/pelvis 08/18/2017-possible cholecystitis, 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 History of anemia, status post a nondiagnostic bone marrow biopsy 10/21/2014  3. NSTEMI March 2014  4. Admission 05/06/2015 with acute onset right abdomen pain-potentially related to acute appendicitis versus pain from carcinomatosis  CT 05/13/2015 consistent with a right lower abdomen abscess, status post catheter drainage by interventional radiology 05/13/2015  Follow-up CT 05/19/2015 showed resolution of the abscess.  Follow-up evaluation in interventional radiology 06/01/2015 showed the abscess cavity had resolved. The abscess drainage catheter communicated with the cecum via the appendix. The catheter was left in place.  CT abdomen/pelvis 06/08/2015 showed the right pelvic drain in place without recurrent or residual surrounding fluid collection. Peritoneal metastasis with slight increase in small to moderate volume of abdominal pelvic ascites.  Drainage catheter injection 06/12/2015 showed residual tiny fistulous connection with the end of the decompressed abscess cavity within the right lower abdominal quadrant and the residual lumen of the appendix.  Paracentesis 06/12/2015 with  5 liters of fluid removed  Paracentesis 06/19/2015-negative cytology and culture  Removal of  abscess drainage catheter 06/23/2015  CT 07/26/2015 with increased right abdominal wall inflammation and increased fluid collection at site of previous right abdomen abscess, ascites, and enlargement/inflammation of the appendix  Placement of right lower quadrant percutaneous drainage catheter 07/27/2015. Contrast injection confirmed persistent fistulous connection with the ill-defined fluid collection and the residual appendix and cecum. Paracentesis with 2 L of fluid removed.  Follow-up CT abdomen/pelvis 08/05/2015 with small to moderate left pleural effusion and moderate to large volume ascites with both appearing slightly decreased since the prior study. Right lower quadrant drainage catheter in place. 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 Drainage catheter left in place.  Tube evaluation 02/10/2016, persistent fistula  Tube evaluation 03/01/2016, continued fistula to the cecum  Tube downsized 05/04/2016, no residual abscess seen  Collapsed abscess with a stable fistula to the cecum on imaging 06/16/2016  Tube removed 07/26/2016  Recurrent drainage from the right lower quadrant tube site August 2018, status post antibiotics with improvement, drainage again November 2018-antibiotic prescribed by Dr. [name]   5. C. difficile colitis 05/10/2015, Lamount Cranker 2019  6. Right leg edema 12/18/2015. Negative venous Doppler.  7.  Renal insufficiency    Disposition: [name] [name] has a history of recurrent gastrointestinal stromal tumor.  He continues [place].  There is no clinical evidence of disease progression.  He will call for pain associated with the right inguinal hernia.  [name] [name] will return for an office and lab visit in 3 months.  Betsy Coder, MD  05/05/2020  12:50 PM

## 2020-05-06 ENCOUNTER — Telehealth: Payer: Self-pay | Admitting: Oncology

## 2020-05-06 NOTE — Telephone Encounter (Signed)
Scheduled per 5/18 los. Unable to reach pt- left voicemail- appts on 8/18

## 2020-06-19 ENCOUNTER — Other Ambulatory Visit: Payer: Self-pay | Admitting: Oncology

## 2020-06-19 DIAGNOSIS — C49A3 Gastrointestinal stromal tumor of small intestine: Secondary | ICD-10-CM

## 2020-06-22 MED FILL — GLEEVEC 400 MG TABLET: 400 | 30 days supply | Qty: 30 | Fill #0

## 2020-07-23 MED FILL — GLEEVEC 400 MG TABLET: 400 | 30 days supply | Qty: 30 | Fill #1

## 2020-08-05 ENCOUNTER — Inpatient Hospital Stay: Payer: Medicare Other

## 2020-08-05 ENCOUNTER — Other Ambulatory Visit: Payer: Self-pay

## 2020-08-05 ENCOUNTER — Inpatient Hospital Stay: Payer: Medicare Other | Attending: Oncology | Admitting: Oncology

## 2020-08-05 ENCOUNTER — Telehealth: Payer: Self-pay

## 2020-08-05 VITALS — BP 171/58 | HR 71 | Temp 97.5°F | Resp 20 | Ht 67.0 in | Wt 165.2 lb

## 2020-08-05 DIAGNOSIS — D649 Anemia, unspecified: Secondary | ICD-10-CM

## 2020-08-05 DIAGNOSIS — N289 Disorder of kidney and ureter, unspecified: Secondary | ICD-10-CM | POA: Insufficient documentation

## 2020-08-05 DIAGNOSIS — E538 Deficiency of other specified B group vitamins: Secondary | ICD-10-CM | POA: Diagnosis present

## 2020-08-05 DIAGNOSIS — C49A3 Gastrointestinal stromal tumor of small intestine: Secondary | ICD-10-CM | POA: Insufficient documentation

## 2020-08-05 DIAGNOSIS — I252 Old myocardial infarction: Secondary | ICD-10-CM | POA: Insufficient documentation

## 2020-08-05 DIAGNOSIS — Z79899 Other long term (current) drug therapy: Secondary | ICD-10-CM | POA: Diagnosis not present

## 2020-08-05 DIAGNOSIS — D539 Nutritional anemia, unspecified: Secondary | ICD-10-CM

## 2020-08-05 LAB — CBC WITH DIFFERENTIAL (CANCER CENTER ONLY)
Abs Immature Granulocytes: 0.01 10*3/uL (ref 0.00–0.07)
Basophils Absolute: 0 10*3/uL (ref 0.0–0.1)
Basophils Relative: 0 %
Eosinophils Absolute: 0 10*3/uL (ref 0.0–0.5)
Eosinophils Relative: 1 %
HCT: 29.7 % — ABNORMAL LOW (ref 39.0–52.0)
Hemoglobin: 9.8 g/dL — ABNORMAL LOW (ref 13.0–17.0)
Immature Granulocytes: 0 %
Lymphocytes Relative: 22 %
Lymphs Abs: 1.1 10*3/uL (ref 0.7–4.0)
MCH: 35.5 pg — ABNORMAL HIGH (ref 26.0–34.0)
MCHC: 33 g/dL (ref 30.0–36.0)
MCV: 107.6 fL — ABNORMAL HIGH (ref 80.0–100.0)
Monocytes Absolute: 0.4 10*3/uL (ref 0.1–1.0)
Monocytes Relative: 9 %
Neutro Abs: 3.3 10*3/uL (ref 1.7–7.7)
Neutrophils Relative %: 68 %
Platelet Count: 186 10*3/uL (ref 150–400)
RBC: 2.76 MIL/uL — ABNORMAL LOW (ref 4.22–5.81)
RDW: 13.3 % (ref 11.5–15.5)
WBC Count: 4.9 10*3/uL (ref 4.0–10.5)
nRBC: 0 % (ref 0.0–0.2)

## 2020-08-05 LAB — CMP (CANCER CENTER ONLY)
ALT: 12 U/L (ref 0–44)
AST: 19 U/L (ref 15–41)
Albumin: 3.6 g/dL (ref 3.5–5.0)
Alkaline Phosphatase: 53 U/L (ref 38–126)
Anion gap: 7 (ref 5–15)
BUN: 20 mg/dL (ref 8–23)
CO2: 26 mmol/L (ref 22–32)
Calcium: 8.8 mg/dL — ABNORMAL LOW (ref 8.9–10.3)
Chloride: 110 mmol/L (ref 98–111)
Creatinine: 1.4 mg/dL — ABNORMAL HIGH (ref 0.61–1.24)
GFR, Est AFR Am: 51 mL/min — ABNORMAL LOW (ref >60)
GFR, Estimated: 44 mL/min — ABNORMAL LOW (ref >60)
Glucose, Bld: 100 mg/dL — ABNORMAL HIGH (ref 70–99)
Potassium: 3.7 mmol/L (ref 3.5–5.1)
Sodium: 143 mmol/L (ref 135–145)
Total Bilirubin: 0.5 mg/dL (ref 0.3–1.2)
Total Protein: 6.2 g/dL — ABNORMAL LOW (ref 6.5–8.1)

## 2020-08-05 LAB — VITAMIN B12: Vitamin B-12: 159 pg/mL — ABNORMAL LOW (ref 180–914)

## 2020-08-05 NOTE — Progress Notes (Signed)
[facility] OFFICE PROGRESS NOTE   Diagnosis: Gastrointestinal stromal tumor  INTERVAL HISTORY:   [name] returns as scheduled.  He continues [place].  He feels well.  He is exercising.  No new complaint.  Objective:  Vital signs in last 24 hours:  Blood pressure (!) 171/58, pulse 71, temperature (!) 97.5 F (36.4 C), temperature source Tympanic, resp. rate 20, height _0  (1.702 m), weight 165 lb 3.2 oz (74.9 kg), SpO2 99 %.    HEENT: No thrush or ulcers Resp: Lungs clear bilaterally Cardio: Regular rate and rhythm GI: No hepatosplenomegaly, no apparent ascites, no mass, nontender Vascular: No leg edema  Skin: No rash    Lab Results:  Lab Results  Component Value Date   WBC 4.9 08/05/2020   HGB 9.8 (L) 08/05/2020   HCT 29.7 (L) 08/05/2020   MCV 107.6 (H) 08/05/2020   PLT 186 08/05/2020   NEUTROABS 3.3 08/05/2020    CMP  Lab Results  Component Value Date   NA 144 05/05/2020   K 3.9 05/05/2020   CL 109 05/05/2020   CO2 25 05/05/2020   GLUCOSE 103 (H) 05/05/2020   BUN 20 05/05/2020   CREATININE 1.35 (H) 05/05/2020   CALCIUM 8.4 (L) 05/05/2020   PROT 6.7 05/05/2020   ALBUMIN 3.7 05/05/2020   AST 22 05/05/2020   ALT 15 05/05/2020   ALKPHOS 55 05/05/2020   BILITOT 0.4 05/05/2020   GFRNONAA 46 (L) 05/05/2020   GFRAA 54 (L) 05/05/2020     Medications: I have reviewed the patient's current medications.   Assessment/Plan: 1. 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  CT 10/16/2014 consistent with extensive carcinomatosis, CT-guided biopsy of an omental mass 10/21/2014 confirmed a gastrointestinal stromal tumor  Initiation of Gleevec 10/31/2014  CT 01/13/2015 revealed improvement in the peritoneal and omental metastatic disease  CT 05/05/2015 with no progression of omental/peritoneal metastatic disease, increased ascites, and appendix inflammation  CT 07/07/2016-no abscess, mild  perihepatic and left pericolic ascites and mesenteric edema  CT of the pelvis 09/20/2016, no residual abscess, no ascites  CT abdomen pelvis renal stones study 04/03/2017-partial small bowel obstruction, potentially related to right inguinal hernia  CT abdomen/pelvis 08/18/2017-possible cholecystitis, 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 History of anemia, status post a nondiagnostic bone marrow biopsy 10/21/2014  3. NSTEMI March 2014  4. Admission 05/06/2015 with acute onset right abdomen pain-potentially related to acute appendicitis versus pain from carcinomatosis  CT 05/13/2015 consistent with a right lower abdomen abscess, status post catheter drainage by interventional radiology 05/13/2015  Follow-up CT 05/19/2015 showed resolution of the abscess.  Follow-up evaluation in interventional radiology 06/01/2015 showed the abscess cavity had resolved. The abscess drainage catheter communicated with the cecum via the appendix. The catheter was left in place.  CT abdomen/pelvis 06/08/2015 showed the right pelvic drain in place without recurrent or residual surrounding fluid collection. Peritoneal metastasis with slight increase in small to moderate volume of abdominal pelvic ascites.  Drainage catheter injection 06/12/2015 showed residual tiny fistulous connection with the end of the decompressed abscess cavity within the right lower abdominal quadrant and the residual lumen of the appendix.  Paracentesis 06/12/2015 with 5 liters of fluid removed  Paracentesis 06/19/2015-negative cytology and culture  Removal of abscess drainage catheter 06/23/2015  CT 07/26/2015 with increased right abdominal wall inflammation and increased  fluid collection at site of previous right abdomen abscess, ascites, and  enlargement/inflammation of the appendix  Placement of right lower quadrant percutaneous drainage catheter 07/27/2015. Contrast injection confirmed persistent fistulous connection with the ill-defined fluid collection and the residual appendix and cecum. Paracentesis with 2 L of fluid removed.  Follow-up CT abdomen/pelvis 08/05/2015 with small to moderate left pleural effusion and moderate to large volume ascites with both appearing slightly decreased since the prior study. Right lower quadrant drainage catheter in place. 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 Drainage catheter left in place.  Tube evaluation 02/10/2016, persistent fistula  Tube evaluation 03/01/2016, continued fistula to the cecum  Tube downsized 05/04/2016, no residual abscess seen  Collapsed abscess with a stable fistula to the cecum on imaging 06/16/2016  Tube removed 07/26/2016  Recurrent drainage from the right lower quadrant tube site August 2018, status post antibiotics with improvement, drainage again November 2018-antibiotic prescribed by Dr. [name]   5. C. difficile colitis 05/10/2015, Lamount Cranker 2019  6. Right leg edema 12/18/2015. Negative venous Doppler.  7.  Renal insufficiency     Disposition: [name] appears stable.  He is tolerating the [place] well.  There is no clinical evidence of disease progression.  He will continue Gleevec.  The anemia is likely secondary to renal insufficiency and Gleevec.  I encouraged him to obtain the COVID-19 booster vaccine.  Mr. Koerber will return for an office and lab visit in 3 months.  Betsy Coder, MD  08/05/2020  9:10 AM

## 2020-08-05 NOTE — Telephone Encounter (Signed)
-----   Message from Ladell Pier, MD sent at 08/05/2020  1:35 PM EDT ----- Please call patient, vitamin B12 level is low, start vitamin B12 1000 mcg daily, schedule COVID-19 booster vaccine, vitamin B12 1 mg subcu same day of booster vaccine

## 2020-08-05 NOTE — Telephone Encounter (Signed)
Spoke with pt about lab results VB12 and Covid booster pt understands to start oral supplement vb12 1053mcg and understands he will be contacted to schedule injections covid booster/vb12

## 2020-08-07 ENCOUNTER — Telehealth: Payer: Self-pay | Admitting: Oncology

## 2020-08-07 NOTE — Telephone Encounter (Signed)
Scheduled appt per 8/19 sch msg - pt wife aware of appt

## 2020-08-12 ENCOUNTER — Inpatient Hospital Stay: Payer: Medicare Other

## 2020-08-12 ENCOUNTER — Other Ambulatory Visit: Payer: Self-pay

## 2020-08-12 ENCOUNTER — Other Ambulatory Visit: Payer: Self-pay | Admitting: *Deleted

## 2020-08-12 DIAGNOSIS — E538 Deficiency of other specified B group vitamins: Secondary | ICD-10-CM

## 2020-08-12 DIAGNOSIS — Z23 Encounter for immunization: Secondary | ICD-10-CM

## 2020-08-12 DIAGNOSIS — C49A3 Gastrointestinal stromal tumor of small intestine: Secondary | ICD-10-CM | POA: Diagnosis not present

## 2020-08-12 MED ORDER — CYANOCOBALAMIN 1000 MCG/ML IJ SOLN
INTRAMUSCULAR | Status: AC
  Filled 2020-08-12: qty 1

## 2020-08-12 MED ORDER — CYANOCOBALAMIN 1000 MCG/ML IJ SOLN
1000.0000 ug | Freq: Once | INTRAMUSCULAR | Status: AC
  Administered 2020-08-12: 1000 ug via SUBCUTANEOUS

## 2020-08-12 NOTE — Patient Instructions (Signed)

## 2020-08-12 NOTE — Progress Notes (Signed)
Pt remained for 15 min post injection observation period.  Denied any changes in condition.

## 2020-08-20 MED FILL — GLEEVEC 400 MG TABLET: 400 | 30 days supply | Qty: 30 | Fill #2

## 2020-09-11 ENCOUNTER — Telehealth: Payer: Self-pay

## 2020-09-11 NOTE — Telephone Encounter (Signed)
Oral Oncology Patient Advocate Encounter  Met patient in [place] to complete an application for [employer] [facility] [place] ([employer]) in an effort to reduce the patient's out of pocket expense for Gleevec to $0.    Application completed and faxed to [phone].   [employer] phone number for follow up is [phone].   This encounter will be updated until final determination.   [place] [facility] [place] Phone [phone] Fax [phone] 09/11/2020 2:11 PM

## 2020-09-26 ENCOUNTER — Other Ambulatory Visit: Payer: Self-pay

## 2020-09-26 ENCOUNTER — Emergency Department (HOSPITAL_COMMUNITY): Payer: Medicare Other

## 2020-09-26 ENCOUNTER — Inpatient Hospital Stay (HOSPITAL_COMMUNITY)
Admission: EM | Admit: 2020-09-26 | Discharge: 2020-09-29 | DRG: 445 | Disposition: A | Discharge status: Home / Self Care | Payer: Medicare Other | Attending: Internal Medicine | Admitting: Internal Medicine

## 2020-09-26 DIAGNOSIS — K8064 Calculus of gallbladder and bile duct with chronic cholecystitis without obstruction: Principal | ICD-10-CM | POA: Diagnosis present

## 2020-09-26 DIAGNOSIS — I2510 Atherosclerotic heart disease of native coronary artery without angina pectoris: Secondary | ICD-10-CM | POA: Diagnosis not present

## 2020-09-26 DIAGNOSIS — K8020 Calculus of gallbladder without cholecystitis without obstruction: Secondary | ICD-10-CM

## 2020-09-26 DIAGNOSIS — Z87440 Personal history of urinary (tract) infections: Secondary | ICD-10-CM

## 2020-09-26 DIAGNOSIS — E785 Hyperlipidemia, unspecified: Secondary | ICD-10-CM | POA: Diagnosis present

## 2020-09-26 DIAGNOSIS — K4090 Unilateral inguinal hernia, without obstruction or gangrene, not specified as recurrent: Secondary | ICD-10-CM | POA: Diagnosis present

## 2020-09-26 DIAGNOSIS — R7989 Other specified abnormal findings of blood chemistry: Secondary | ICD-10-CM | POA: Diagnosis present

## 2020-09-26 DIAGNOSIS — R1013 Epigastric pain: Secondary | ICD-10-CM | POA: Diagnosis present

## 2020-09-26 DIAGNOSIS — Z20822 Contact with and (suspected) exposure to covid-19: Secondary | ICD-10-CM | POA: Diagnosis present

## 2020-09-26 DIAGNOSIS — R748 Abnormal levels of other serum enzymes: Secondary | ICD-10-CM

## 2020-09-26 DIAGNOSIS — C49A0 Gastrointestinal stromal tumor, unspecified site: Secondary | ICD-10-CM | POA: Diagnosis present

## 2020-09-26 DIAGNOSIS — N400 Enlarged prostate without lower urinary tract symptoms: Secondary | ICD-10-CM | POA: Diagnosis present

## 2020-09-26 DIAGNOSIS — I5032 Chronic diastolic (congestive) heart failure: Secondary | ICD-10-CM | POA: Diagnosis present

## 2020-09-26 DIAGNOSIS — I252 Old myocardial infarction: Secondary | ICD-10-CM | POA: Diagnosis not present

## 2020-09-26 DIAGNOSIS — I493 Ventricular premature depolarization: Secondary | ICD-10-CM | POA: Diagnosis present

## 2020-09-26 DIAGNOSIS — Z79899 Other long term (current) drug therapy: Secondary | ICD-10-CM

## 2020-09-26 DIAGNOSIS — K819 Cholecystitis, unspecified: Secondary | ICD-10-CM | POA: Diagnosis not present

## 2020-09-26 DIAGNOSIS — Z8701 Personal history of pneumonia (recurrent): Secondary | ICD-10-CM | POA: Diagnosis not present

## 2020-09-26 DIAGNOSIS — I340 Nonrheumatic mitral (valve) insufficiency: Secondary | ICD-10-CM | POA: Diagnosis not present

## 2020-09-26 DIAGNOSIS — N1832 Chronic kidney disease, stage 3b: Secondary | ICD-10-CM | POA: Diagnosis present

## 2020-09-26 DIAGNOSIS — N179 Acute kidney failure, unspecified: Secondary | ICD-10-CM | POA: Diagnosis present

## 2020-09-26 DIAGNOSIS — I130 Hypertensive heart and chronic kidney disease with heart failure and stage 1 through stage 4 chronic kidney disease, or unspecified chronic kidney disease: Secondary | ICD-10-CM | POA: Diagnosis present

## 2020-09-26 DIAGNOSIS — Z01810 Encounter for preprocedural cardiovascular examination: Secondary | ICD-10-CM | POA: Diagnosis not present

## 2020-09-26 DIAGNOSIS — I248 Other forms of acute ischemic heart disease: Secondary | ICD-10-CM | POA: Diagnosis not present

## 2020-09-26 LAB — CBC WITH DIFFERENTIAL/PLATELET
Abs Immature Granulocytes: 0.01 10*3/uL (ref 0.00–0.07)
Basophils Absolute: 0 10*3/uL (ref 0.0–0.1)
Basophils Relative: 0 %
Eosinophils Absolute: 0 10*3/uL (ref 0.0–0.5)
Eosinophils Relative: 1 %
HCT: 30.6 % — ABNORMAL LOW (ref 39.0–52.0)
Hemoglobin: 10.1 g/dL — ABNORMAL LOW (ref 13.0–17.0)
Immature Granulocytes: 0 %
Lymphocytes Relative: 16 %
Lymphs Abs: 1 10*3/uL (ref 0.7–4.0)
MCH: 36.5 pg — ABNORMAL HIGH (ref 26.0–34.0)
MCHC: 33 g/dL (ref 30.0–36.0)
MCV: 110.5 fL — ABNORMAL HIGH (ref 80.0–100.0)
Monocytes Absolute: 0.5 10*3/uL (ref 0.1–1.0)
Monocytes Relative: 9 %
Neutro Abs: 4.4 10*3/uL (ref 1.7–7.7)
Neutrophils Relative %: 74 %
Platelets: 209 10*3/uL (ref 150–400)
RBC: 2.77 MIL/uL — ABNORMAL LOW (ref 4.22–5.81)
RDW: 12.9 % (ref 11.5–15.5)
WBC: 6 10*3/uL (ref 4.0–10.5)
nRBC: 0 % (ref 0.0–0.2)

## 2020-09-26 LAB — COMPREHENSIVE METABOLIC PANEL
ALT: 16 U/L (ref 0–44)
AST: 23 U/L (ref 15–41)
Albumin: 3.9 g/dL (ref 3.5–5.0)
Alkaline Phosphatase: 45 U/L (ref 38–126)
Anion gap: 10 (ref 5–15)
BUN: 28 mg/dL — ABNORMAL HIGH (ref 8–23)
CO2: 29 mmol/L (ref 22–32)
Calcium: 9.1 mg/dL (ref 8.9–10.3)
Chloride: 105 mmol/L (ref 98–111)
Creatinine, Ser: 1.49 mg/dL — ABNORMAL HIGH (ref 0.61–1.24)
GFR, Estimated: 41 mL/min — ABNORMAL LOW (ref >60)
Glucose, Bld: 110 mg/dL — ABNORMAL HIGH (ref 70–99)
Potassium: 3.7 mmol/L (ref 3.5–5.1)
Sodium: 144 mmol/L (ref 135–145)
Total Bilirubin: 0.7 mg/dL (ref 0.3–1.2)
Total Protein: 6.5 g/dL (ref 6.5–8.1)

## 2020-09-26 LAB — CBC
HCT: 31.2 % — ABNORMAL LOW (ref 39.0–52.0)
Hemoglobin: 10.2 g/dL — ABNORMAL LOW (ref 13.0–17.0)
MCH: 36.3 pg — ABNORMAL HIGH (ref 26.0–34.0)
MCHC: 32.7 g/dL (ref 30.0–36.0)
MCV: 111 fL — ABNORMAL HIGH (ref 80.0–100.0)
Platelets: 185 10*3/uL (ref 150–400)
RBC: 2.81 MIL/uL — ABNORMAL LOW (ref 4.22–5.81)
RDW: 12.7 % (ref 11.5–15.5)
WBC: 7.9 10*3/uL (ref 4.0–10.5)
nRBC: 0 % (ref 0.0–0.2)

## 2020-09-26 LAB — RESPIRATORY PANEL BY RT PCR (FLU A&B, COVID)
Influenza A by PCR: NEGATIVE
Influenza B by PCR: NEGATIVE
SARS Coronavirus 2 by RT PCR: NEGATIVE

## 2020-09-26 LAB — LACTIC ACID, PLASMA: Lactic Acid, Venous: 1.1 mmol/L (ref 0.5–1.9)

## 2020-09-26 LAB — TROPONIN I (HIGH SENSITIVITY)
Troponin I (High Sensitivity): 25 ng/L — ABNORMAL HIGH (ref <18)
Troponin I (High Sensitivity): 25 ng/L — ABNORMAL HIGH (ref <18)

## 2020-09-26 LAB — LIPASE, BLOOD: Lipase: 140 U/L — ABNORMAL HIGH (ref 11–51)

## 2020-09-26 LAB — CREATININE, SERUM
Creatinine, Ser: 1.25 mg/dL — ABNORMAL HIGH (ref 0.61–1.24)
GFR, Estimated: 51 mL/min — ABNORMAL LOW (ref >60)

## 2020-09-26 MED ORDER — ONDANSETRON HCL 4 MG PO TABS: 4.0000 mg | ORAL_TABLET | Freq: Four times a day (QID) | ORAL | Status: DC | PRN

## 2020-09-26 MED ORDER — SODIUM CHLORIDE 0.9 % IV BOLUS
500.0000 mL | Freq: Once | INTRAVENOUS | Status: AC
  Administered 2020-09-26: 500 mL via INTRAVENOUS

## 2020-09-26 MED ORDER — MORPHINE SULFATE (PF) 2 MG/ML IV SOLN
2.0000 mg | Freq: Once | INTRAVENOUS | Status: AC
  Administered 2020-09-26: 2 mg via INTRAVENOUS
  Filled 2020-09-26: qty 1

## 2020-09-26 MED ORDER — METRONIDAZOLE IN NACL 5-0.79 MG/ML-% IV SOLN
500.0000 mg | Freq: Three times a day (TID) | INTRAVENOUS | Status: DC
  Administered 2020-09-26 – 2020-09-29 (×9): 500 mg via INTRAVENOUS
  Filled 2020-09-26 (×8): qty 100

## 2020-09-26 MED ORDER — ENOXAPARIN SODIUM 40 MG/0.4ML ~~LOC~~ SOLN
40.0000 mg | SUBCUTANEOUS | Status: DC
  Administered 2020-09-26: 40 mg via SUBCUTANEOUS
  Filled 2020-09-26: qty 0.4

## 2020-09-26 MED ORDER — MORPHINE SULFATE (PF) 2 MG/ML IV SOLN
2.0000 mg | INTRAVENOUS | Status: DC | PRN
  Administered 2020-09-26 – 2020-09-27 (×2): 2 mg via INTRAVENOUS
  Filled 2020-09-26 (×2): qty 1

## 2020-09-26 MED ORDER — SODIUM CHLORIDE (PF) 0.9 % IJ SOLN
INTRAMUSCULAR | Status: AC
  Filled 2020-09-26: qty 50

## 2020-09-26 MED ORDER — AMLODIPINE BESYLATE 5 MG PO TABS
5.0000 mg | ORAL_TABLET | Freq: Every day | ORAL | Status: DC
  Administered 2020-09-26 – 2020-09-29 (×3): 5 mg via ORAL
  Filled 2020-09-26 (×4): qty 1

## 2020-09-26 MED ORDER — SIMVASTATIN 20 MG PO TABS
20.0000 mg | ORAL_TABLET | Freq: Every evening | ORAL | Status: DC
  Administered 2020-09-26: 20 mg via ORAL
  Filled 2020-09-26: qty 1

## 2020-09-26 MED ORDER — FUROSEMIDE 40 MG PO TABS
20.0000 mg | ORAL_TABLET | Freq: Every day | ORAL | Status: DC
  Administered 2020-09-26 – 2020-09-27 (×2): 20 mg via ORAL
  Filled 2020-09-26 (×2): qty 1

## 2020-09-26 MED ORDER — TAMSULOSIN HCL 0.4 MG PO CAPS
0.4000 mg | ORAL_CAPSULE | Freq: Every day | ORAL | Status: DC
  Administered 2020-09-26 – 2020-09-29 (×4): 0.4 mg via ORAL
  Filled 2020-09-26 (×4): qty 1

## 2020-09-26 MED ORDER — IOHEXOL 300 MG/ML  SOLN
75.0000 mL | Freq: Once | INTRAMUSCULAR | Status: AC | PRN
  Administered 2020-09-26: 75 mL via INTRAVENOUS

## 2020-09-26 MED ORDER — ONDANSETRON HCL 4 MG/2ML IJ SOLN: 4.0000 mg | Freq: Four times a day (QID) | INTRAMUSCULAR | Status: DC | PRN

## 2020-09-26 MED ORDER — METOPROLOL TARTRATE 25 MG PO TABS
25.0000 mg | ORAL_TABLET | Freq: Two times a day (BID) | ORAL | Status: DC
  Administered 2020-09-26 – 2020-09-29 (×6): 25 mg via ORAL
  Filled 2020-09-26 (×6): qty 1

## 2020-09-26 MED ORDER — SODIUM CHLORIDE 0.9 % IV SOLN: Freq: Once | INTRAVENOUS | Status: AC

## 2020-09-26 MED ORDER — ACETAMINOPHEN 650 MG RE SUPP: 650.0000 mg | Freq: Four times a day (QID) | RECTAL | Status: DC | PRN

## 2020-09-26 MED ORDER — ACETAMINOPHEN 325 MG PO TABS
650.0000 mg | ORAL_TABLET | Freq: Four times a day (QID) | ORAL | Status: DC | PRN
  Administered 2020-09-26 – 2020-09-28 (×2): 650 mg via ORAL
  Filled 2020-09-26 (×2): qty 2

## 2020-09-26 MED ORDER — ONDANSETRON HCL 4 MG/2ML IJ SOLN
4.0000 mg | Freq: Once | INTRAMUSCULAR | Status: AC
  Administered 2020-09-26: 4 mg via INTRAVENOUS
  Filled 2020-09-26: qty 2

## 2020-09-26 MED ORDER — HYDROMORPHONE HCL 1 MG/ML IJ SOLN
0.5000 mg | Freq: Once | INTRAMUSCULAR | Status: AC
  Administered 2020-09-26: 0.5 mg via INTRAVENOUS
  Filled 2020-09-26: qty 1

## 2020-09-26 MED ORDER — SODIUM CHLORIDE 0.9 % IV SOLN
2.0000 g | INTRAVENOUS | Status: DC
  Administered 2020-09-26 – 2020-09-28 (×3): 2 g via INTRAVENOUS
  Filled 2020-09-26 (×2): qty 2
  Filled 2020-09-26 (×2): qty 20

## 2020-09-26 NOTE — ED Provider Notes (Signed)
86yo male followed by oncology for prior GIST tumor, abdominal pain x 4 days. Awaiting labs and CT.  Physical Exam  BP (!) 150/66 (BP Location: Right Arm)   Pulse 67   Temp 98.4 F (36.9 C) (Oral)   Resp 18   Ht 5' 7"  (1.702 m)   Wt 73.9 kg   SpO2 98%   BMI 25.53 kg/m   Physical Exam Abdomen is soft and non tender, right inguinal hernia present and non tender. Negative Marcy Bogosian sign. ED Course/Procedures     Procedures  MDM  Labs with elevated lipase, normal LFTs, bili, alk phos. CT with gallstones, right inguinal hernia.   Reviewed with Dr. Sherry Ruffing, ER attending, plan is to follow up gallstones on CT with [place]. Patient agreeable with plan, continues to have epigastric pain, no abdominal tenderness.   1:40PM, patient returns from [place], worsening epigastric pain and requesting something for pain. IV morphine ordered, pending [place] read. RUQ tenderness on exam.  3:00PM [place] with cholelithiasis. No relief with Morphine, discussed with Dr. Marlou Starks with general surgery, plan is to admit for symptomatic cholelithiasis, requests medicine to admit for clearance.   3:30PM Discussed with Dr. Marylyn Ishihara who will consult for admission.     Tacy Learn, PA-C 09/26/20 [place], April, MD 09/26/20 [id number]

## 2020-09-26 NOTE — Consult Note (Signed)
Reason for Consult:chest pain Referring Physician: Dr. Casandra Doffing [name] [name] is an 86 y.o. male.  HPI: The patient is an 86 year old white male who presents tonight with chest pain [name] fever.  He denies any nausea or vomiting.  He denies any abdominal pain.  He underwent an ultrasound [name] CT that did show stones in the gallbladder but no gallbladder wall thickening.  His liver functions were normal.  Past Medical History:  Diagnosis Date  . Abdominal mass 02/2013  . Acute respiratory failure with hypoxia ([place]) 09/12/2014  . CAD (coronary artery disease)   . CKD (chronic kidney disease), stage [name]   . GIST (gastrointestinal stroma tumor), malignant, colon ([place])    [name] [name] 03/13/2017  . Hemorrhagic shock ([place]) 09/13/2014  . Hyperlipidemia    [name] [name]  . Hypertension    [name] [name],[name] [name],[name] [name]  . Intraperitoneal hemorrhage 09/17/2014  . NSTEMI (non-ST elevated myocardial infarction) ([place]) 02/2013   "LIGHT: ONE [name] [name] SAID HE DID [name] ONE SAID HE DIDN'T  . Pneumonia    MAR 2014  . Shortness of breath   . Stromal tumor of digestive system ([place])   . UTI (urinary tract infection)     Past Surgical History:  Procedure Laterality Date  . CARDIAC CATHETERIZATION  03-05-13  . COLONOSCOPY  09/08/2014   dr scooler  . CYST EXCISION  1962   wrist  . IR GENERIC HISTORICAL  05/17/2016   IR RADIOLOGIST EVAL & MGMT 05/17/2016 [name] Keen, [name] [name] [facility] INTERV RAD  . IR GENERIC HISTORICAL  07/26/2016   IR RADIOLOGIST EVAL & MGMT 07/26/2016 [name] Cadet, [name] [name] [facility] INTERV RAD  . LAPAROTOMY N/A 06/04/2013   Procedure: EXPLORATORY LAPAROTOMY, OPEN RESECTION OF MESENTERIC [name] INTESTINAL MASS;  Surgeon: [name] Hector, [name] [name];  Location: [facility];  Service: General;  Laterality: N/A;  Small Bowel Resection  . LEFT HEART CATHETERIZATION WITH CORONARY ANGIOGRAM N/A 03/05/2013   Procedure: LEFT HEART CATHETERIZATION WITH CORONARY ANGIOGRAM;  Surgeon: [name] M Martinique, [name] [name];  Location: [facility] CATH LAB;  Service:  Cardiovascular;  Laterality: N/A;    Family History  Problem Relation Age of Onset  . Stroke Mother   . Skin cancer Father   . Skin cancer Brother     Social History:  reports that he has never smoked. He has never used smokeless tobacco. He reports that he does not drink alcohol [name] does not use drugs.  Allergies: No Known Allergies  Medications: I have reviewed the patient's current medications.  Results for orders placed or performed during the hospital encounter of 09/26/20 (from the past 48 hour(s))  CBC with Differential/Platelet     Status: Abnormal   Collection Time: 09/26/20  5:47 AM  Result Value Ref Range   WBC 6.0 4.0 - 10.5 K/uL   RBC 2.77 (L) 4.22 - 5.81 MIL/uL   Hemoglobin 10.1 (L) 13.0 - 17.0 g/dL   HCT 30.6 (L) 39 - 52 %   MCV 110.5 (H) 80.0 - 100.0 fL   MCH 36.5 (H) 26.0 - 34.0 pg   MCHC 33.0 30.0 - 36.0 g/dL   RDW 12.9 11.5 - 15.5 %   Platelets 209 150 - 400 K/uL   nRBC 0.0 0.0 - 0.2 %   Neutrophils Relative % 74 %   Neutro Abs 4.4 1.7 - 7.7 K/uL   Lymphocytes Relative 16 %   Lymphs Abs 1.0 0.7 - 4.0 K/uL   Monocytes Relative 9 %   Monocytes Absolute 0.5 0.1 - 1.0 K/uL  Eosinophils Relative 1 %   Eosinophils Absolute 0.0 0 - 0 K/uL   Basophils Relative 0 %   Basophils Absolute 0.0 0 - 0 K/uL   Immature Granulocytes 0 %   Abs Immature Granulocytes 0.01 0.00 - 0.07 K/uL    Comment: Performed at [facility], [place] [address], [place], [place] [address]  Lipase, blood     Status: Abnormal   Collection Time: 09/26/20  5:47 AM  Result Value Ref Range   Lipase 140 (H) 11 - 51 U/L    Comment: Performed at [facility], [place] [address] [place], [place] [address]  Comprehensive metabolic panel     Status: Abnormal   Collection Time: 09/26/20  5:47 AM  Result Value Ref Range   Sodium 144 135 - 145 mmol/L   Potassium 3.7 3.5 - 5.1 mmol/L   Chloride 105 98 - 111 mmol/L   CO2 29 22 - 32 mmol/L   Glucose, Bld 110 (H) 70  - 99 mg/dL    Comment: Glucose reference range applies only to samples taken after fasting for at least 8 hours.   BUN 28 (H) 8 - 23 mg/dL   Creatinine, Ser 1.49 (H) 0.61 - 1.24 mg/dL   Calcium 9.1 8.9 - 10.3 mg/dL   Total Protein 6.5 6.5 - 8.1 g/dL   Albumin 3.9 3.5 - 5.0 g/dL   AST 23 15 - 41 U/L   ALT 16 0 - 44 U/L   Alkaline Phosphatase 45 38 - 126 U/L   Total Bilirubin 0.7 0.3 - 1.2 mg/dL   GFR, Estimated 41 (L) >60 mL/min   Anion gap 10 5 - 15    Comment: Performed at [facility], [place] [address] [place], [place] [address]  Lactic acid, plasma     Status: None   Collection Time: 09/26/20  5:47 AM  Result Value Ref Range   Lactic Acid, Venous 1.1 0.5 - 1.9 mmol/L    Comment: Performed at [facility], [place] [address] [place], [place] [address]  Troponin I (High Sensitivity)     Status: Abnormal   Collection Time: 09/26/20  5:47 AM  Result Value Ref Range   Troponin I (High Sensitivity) 25 (H) <18 ng/L    Comment: (NOTE) Elevated high sensitivity troponin I (hsTnI) values [name] significant  changes across serial measurements may suggest ACS but many other  chronic [name] acute conditions are known to elevate hsTnI results.  Refer to the "Links" section for chest pain algorithms [name] additional  guidance. Performed at [facility], [place] [address] [place], [place] [address]   Troponin I (High Sensitivity)     Status: Abnormal   Collection Time: 09/26/20  7:34 AM  Result Value Ref Range   Troponin I (High Sensitivity) 25 (H) <18 ng/L    Comment: (NOTE) Elevated high sensitivity troponin I (hsTnI) values [name] significant  changes across serial measurements may suggest ACS but many other  chronic [name] acute conditions are known to elevate hsTnI results.  Refer to the "Links" section for chest pain algorithms [name] additional  guidance. Performed at [facility], [place] [address], [place], [place] [address]    Respiratory Panel by RT PCR (Flu A&B, Covid) - Nasopharyngeal Swab     Status: None   Collection Time: 09/26/20  3:04 PM   Specimen: Nasopharyngeal Swab  Result Value Ref Range   SARS Coronavirus 2 by RT PCR NEGATIVE NEGATIVE  Comment: (NOTE) SARS-CoV-2 target nucleic acids are NOT DETECTED.  The SARS-CoV-2 RNA is generally detectable in upper respiratoy specimens during the acute phase of infection. The lowest concentration of SARS-CoV-2 viral copies this assay can detect is 131 copies/mL. A negative result does not preclude SARS-Cov-2 infection [name] should not be used as the sole basis for treatment or other patient management decisions. A negative result may occur with  improper specimen collection/handling, submission of specimen other than nasopharyngeal swab, presence of viral mutation(s) within the areas targeted by this assay, [name] inadequate number of viral copies (<131 copies/mL). A negative result must be combined with clinical observations, patient history, [name] epidemiological information. The expected result is Negative.  Fact Sheet for Patients:  PinkCheek.be  Fact Sheet for Healthcare Providers:  GravelBags.it  This test is no t yet approved or cleared by the [place] FDA [name]  has been authorized for detection [name]/or diagnosis of SARS-CoV-2 by FDA under an Emergency Use Authorization (EUA). This EUA will remain  in effect (meaning this test can be used) for the duration of the COVID-19 declaration under Section 564(b)(1) of the Act, 21 U.S.C. section 360bbb-3(b)(1), unless the authorization is terminated or revoked sooner.     Influenza A by PCR NEGATIVE NEGATIVE   Influenza B by PCR NEGATIVE NEGATIVE    Comment: (NOTE) The Xpert Xpress SARS-CoV-2/FLU/RSV assay is intended as an aid in  the diagnosis of influenza from Nasopharyngeal swab specimens [name]  should not be used as a sole basis for  treatment. Nasal washings [name]  aspirates are unacceptable for Xpert Xpress SARS-CoV-2/FLU/RSV  testing.  Fact Sheet for Patients: PinkCheek.be  Fact Sheet for Healthcare Providers: GravelBags.it  This test is not yet approved or cleared by the [place] FDA [name]  has been authorized for detection [name]/or diagnosis of SARS-CoV-2 by  FDA under an Emergency Use Authorization (EUA). This EUA will remain  in effect (meaning this test can be used) for the duration of the  Covid-19 declaration under Section 564(b)(1) of the Act, 21  U.S.C. section 360bbb-3(b)(1), unless the authorization is  terminated or revoked. Performed at [facility], [place] [address], [place], [place] [address]   CBC     Status: Abnormal   Collection Time: 09/26/20  5:58 PM  Result Value Ref Range   WBC 7.9 4.0 - 10.5 K/uL   RBC 2.81 (L) 4.22 - 5.81 MIL/uL   Hemoglobin 10.2 (L) 13.0 - 17.0 g/dL   HCT 31.2 (L) 39 - 52 %   MCV 111.0 (H) 80.0 - 100.0 fL   MCH 36.3 (H) 26.0 - 34.0 pg   MCHC 32.7 30.0 - 36.0 g/dL   RDW 12.7 11.5 - 15.5 %   Platelets 185 150 - 400 K/uL   nRBC 0.0 0.0 - 0.2 %    Comment: Performed at [facility], [place] [address], [place], [place] [address]  Creatinine, serum     Status: Abnormal   Collection Time: 09/26/20  5:58 PM  Result Value Ref Range   Creatinine, Ser 1.25 (H) 0.61 - 1.24 mg/dL   GFR, Estimated 51 (L) >60 mL/min    Comment: Performed at [name], [place] [address] [place], [place] [address]    CT ABDOMEN PELVIS W CONTRAST  Result Date: 09/26/2020 CLINICAL DATA:  Abdominal pain [name] diarrhea EXAM: CT ABDOMEN [name] PELVIS WITH CONTRAST TECHNIQUE: Multidetector CT imaging of the abdomen [name] pelvis was performed using the standard protocol following bolus administration  of intravenous contrast. CONTRAST:  51mL OMNIPAQUE IOHEXOL 300 MG/ML  SOLN COMPARISON:  November 19, 2018 FINDINGS: Lower chest: Minimal atelectasis of posterior lung bases are noted. Hepatobiliary: Gallstone is noted in the gallbladder. No inflammation is identified around the gallbladder. No focal liver lesion is identified. There is prominence of common hepatic duct measuring 1 cm unchanged compared to prior exam. Pancreas: Unremarkable. No pancreatic ductal dilatation or surrounding inflammatory changes. Spleen: Normal in size without focal abnormality. Adrenals/Urinary Tract: The adrenal glands are normal. Small cysts identified in the right kidney. The left kidney is normal. There is no hydronephrosis bilaterally. The bladder is normal. Stomach/Bowel: There is right inguinal herniation of mesenteric fat [name] small bowel loops without evidence of bowel obstruction or incarceration. The appearance is unchanged compared prior CT. Patient status post prior surgery of bowel loops in the right lower abdomen. There is no evidence of diverticulitis. The appendix is not seen but no inflammation is noted around cecum. Vascular/Lymphatic: Aortic atherosclerosis. No enlarged abdominal or pelvic lymph nodes. Reproductive: Prostate is unremarkable. Other: None. Musculoskeletal: Degenerative joint changes of the spine are identified. IMPRESSION: 1. Right inguinal herniation of mesenteric fat [name] small bowel loops without evidence of bowel obstruction or incarceration. The appearance is unchanged compared to prior CT. 2. Cholelithiasis. 3. Aortic atherosclerosis. Aortic Atherosclerosis (ICD10-I70.0). Electronically Signed   By: [name] Diesel M.D.   On: 09/26/2020 08:53   US Abdomen Limited  Result Date: 09/26/2020 CLINICAL DATA:  Follow-up gallstones. EXAM: ULTRASOUND ABDOMEN LIMITED RIGHT UPPER QUADRANT COMPARISON:  CT abdomen pelvis October 9th 2021 FINDINGS: Gallbladder: Gallstones are identified in the gallbladder. There is no pericholecystic fluid. No sonographic Percell Miller sign is reported by the ultrasound  technologist. Common bile duct: Diameter: 9 mm.  This is upper limits of normal for patient's age. Liver: Mild inhomogeneous echotexture of liver is identified. Portal vein is patent on color Doppler imaging with normal direction of blood flow towards the liver. Other: None. IMPRESSION: Cholelithiasis without sonographic evidence of acute cholecystitis. Electronically Signed   By: [name] Diesel M.D.   On: 09/26/2020 14:08    Review of Systems  Constitutional: Positive for chills [name] fever.  HENT: Negative.   Eyes: Negative.   Respiratory: Negative.   Cardiovascular: Negative.   Gastrointestinal: Negative.   Endocrine: Negative.   Genitourinary: Negative.   Musculoskeletal: Negative.   Skin: Negative.   Allergic/Immunologic: Negative.   Neurological: Negative.   Hematological: Negative.   Psychiatric/Behavioral: Negative.    Blood pressure (!) 132/97, pulse (!) 109, temperature (!) 100.4 F (38 C), temperature source Oral, resp. rate (!) 28, height 5\' 7"  (1.702 m), weight 73.9 kg, SpO2 99 %. Physical Exam Constitutional:      Appearance: Normal appearance. He is not ill-appearing.     Comments: Having shaking chills  HENT:     Head: Normocephalic [name] atraumatic.     Right Ear: External ear normal.     Left Ear: External ear normal.     Nose: Nose normal.     Mouth/Throat:     Mouth: Mucous membranes are moist.     Pharynx: Oropharynx is clear.  Eyes:     General: No scleral icterus.    Extraocular Movements: Extraocular movements intact.     Conjunctiva/sclera: Conjunctivae normal.     Pupils: Pupils are equal, round, [name] reactive to light.  Cardiovascular:     Rate [name] Rhythm: Normal rate [name] regular rhythm.     Pulses: Normal pulses.  Heart sounds: Normal heart sounds.  Pulmonary:     Effort: No respiratory distress.     Breath sounds: Normal breath sounds.  Abdominal:     General: Abdomen is flat.     Palpations: Abdomen is soft.     Tenderness: There is no  abdominal tenderness.     Hernia: A hernia is present.  Musculoskeletal:        General: No swelling or deformity. Normal range of motion.     Cervical back: Normal range of motion [name] neck supple. No tenderness.  Lymphadenopathy:     Comments: No palpable groin or cervical lymphadenopathy  Skin:    General: Skin is warm [name] dry.  Neurological:     General: No focal deficit present.     Mental Status: He is alert [name] oriented to person, place, [name] time.  Psychiatric:        Mood [name] Affect: Mood normal.        Behavior: Behavior normal.     Assessment/Plan: The patient appears to have gallstones.  He is complaining mostly of chest pain [name] denies any abdominal pain.  It is difficult to know if the chest pain is a manifestation of the gallstones or if it is a separate problem.  He does have some elevated troponins.  I believe he will need a more significant cardiac work-up before he would be a candidate for surgery given his age [name] underlying conditions.  We will continue to follow closely with you.  [name] [name] 09/26/2020, 7:53 PM

## 2020-09-26 NOTE — ED Notes (Signed)
Provider notified due to pt pain 9/10 despite morphine admin one hour ago

## 2020-09-26 NOTE — ED Triage Notes (Signed)
Patient brought by [facility] for abdominal pain and diarrhea. EMS reports symptoms started 4 days ago. EMS reports that patient awoke from abdominal pain 8/10 at 330 am so he called 911.

## 2020-09-26 NOTE — ED Notes (Signed)
Patient is complaint of chest pain. Provider is aware.

## 2020-09-26 NOTE — ED Notes (Signed)
Provider at bedside. Notified of pt pain and fever of 100.96f

## 2020-09-26 NOTE — H&P (Signed)
History and Physical    [name] [name] VEL:[id number] DOB: 10/22/1929 DOA: 09/26/2020  PCP: Orpah Melter, MD  Patient coming from: Home  Chief Complaint: epigastric abdominal pain.  HPI: [name] [name] is a 86 y.o. male with medical history significant of GIST. Presenting with epigastric abdominal pain that started 4 days ago. It is constant, sharp and non-radiating. OTC medications did not help relieve the pain. He does not identify anything specifically that makes the pain worse. He had a strong flare this morning 8/10 and decided that it was time to come to the ED. He denies any other treatment.    ED Course: Imaging revealed cholelithiasis/questioned cholecystitis. ED spoke with general surgery. Recommended admission for symptomatic cholecystitis  Review of Systems:  Denies CP, N, V, fevers, palpitations, syncopal episodes, D. Review of systems is otherwise negative for all not mentioned in HPI.   PMHx Past Medical History:  Diagnosis Date  . Abdominal mass 02/2013  . Acute respiratory failure with hypoxia ([place]) 09/12/2014  . CAD (coronary artery disease)   . CKD (chronic kidney disease), stage III   . GIST (gastrointestinal stroma tumor), malignant, colon ([place])    [name] 03/13/2017  . Hemorrhagic shock ([place]) 09/13/2014  . Hyperlipidemia    TAKES zOCOR DAILY  . Hypertension    TAKES LOTREL,HCTZ,AND METOPROLOL DAILY  . Intraperitoneal hemorrhage 09/17/2014  . NSTEMI (non-ST elevated myocardial infarction) ([place]) 02/2013   "LIGHT: ONE MD SAID HE DID AND ONE SAID HE DIDN'T  . Pneumonia    MAR 2014  . Shortness of breath   . Stromal tumor of digestive system ([place])   . UTI (urinary tract infection)     PSHx Past Surgical History:  Procedure Laterality Date  . CARDIAC CATHETERIZATION  03-05-13  . COLONOSCOPY  09/08/2014   dr scooler  . CYST EXCISION  1962   wrist  . IR GENERIC HISTORICAL  05/17/2016   IR RADIOLOGIST EVAL & MGMT 05/17/2016 [name], MD [facility] INTERV RAD  . IR  GENERIC HISTORICAL  07/26/2016   IR RADIOLOGIST EVAL & MGMT 07/26/2016 [name], MD [facility] INTERV RAD  . LAPAROTOMY N/A 06/04/2013   Procedure: EXPLORATORY LAPAROTOMY, OPEN RESECTION OF MESENTERIC AND INTESTINAL MASS;  Surgeon: [name], MD;  Location: [facility];  Service: General;  Laterality: N/A;  Small Bowel Resection  . LEFT HEART CATHETERIZATION WITH CORONARY ANGIOGRAM N/A 03/05/2013   Procedure: LEFT HEART CATHETERIZATION WITH CORONARY ANGIOGRAM;  Surgeon: [name], MD;  Location: [facility] CATH LAB;  Service: Cardiovascular;  Laterality: N/A;    SocHx  reports that he has never smoked. He has never used smokeless tobacco. He reports that he does not drink alcohol and does not use drugs.  No Known Allergies  FamHx Family History  Problem Relation Age of Onset  . Stroke Mother   . Skin cancer Father   . Skin cancer Brother     Prior to Admission medications   Medication Sig Start Date End Date Taking? Authorizing Provider  amLODipine (NORVASC) 5 MG tablet Take 5 mg by mouth daily. 03/17/15  Yes [provider]  furosemide (LASIX) 20 MG tablet Take 1 tablet (20 mg total) by mouth daily. 05/20/15  Yes Hosie Poisson, MD  GLEEVEC 400 MG tablet TAKE 1 TABLET (400 MG TOTAL) BY MOUTH DAILY. TAKE WITH MEALS AND LARGE GLASS OF WATER. 06/19/20  Yes Ladell Pier, MD  metoprolol tartrate (LOPRESSOR) 25 MG tablet Take 1 tablet (25 mg total) by mouth 2 (two) times daily.  03/06/13  Yes Caren Griffins, MD  simvastatin (ZOCOR) 20 MG tablet Take 20 mg by mouth every evening.   Yes [provider]  tamsulosin (FLOMAX) 0.4 MG CAPS capsule Take 0.4 mg by mouth daily. 02/04/20  Yes [provider]    Physical Exam: Vitals:   09/26/20 1321 09/26/20 1500 09/26/20 1655 09/26/20 1704  BP: (!) 147/61 (!) 164/69 (!) 154/75 (!) 157/65  Pulse: (!) 38 84 81 81  Resp: 18 15 17  (!) 21  Temp:      TempSrc:      SpO2: 98% 98% 97%   Weight:      Height:        General:  86 y.o. male resting in bed in NAD Eyes: PERRL, normal sclera ENMT: Nares patent w/o discharge, orophaynx clear, dentition normal, ears w/o discharge/lesions/ulcers Neck: Supple, trachea midline Cardiovascular: RRR, +S1, S2, no m/g/r, equal pulses throughout Respiratory: shallow d/t pain, no w/r/r, normal WOB GI: BS+, ND, epigastric TTP, no masses noted, no organomegaly noted, right inguinal hernia noted MSK: No e/c/c Skin: No rashes, bruises, ulcerations noted Neuro: A&O x 3, no focal deficits Psyc: Appropriate interaction and affect, calm/cooperative  Labs on Admission: I have personally reviewed following labs and imaging studies  CBC: Recent Labs  Lab 09/26/20 0547  WBC 6.0  NEUTROABS 4.4  HGB 10.1*  HCT 30.6*  MCV 110.5*  PLT 735   Basic Metabolic Panel: Recent Labs  Lab 09/26/20 0547  NA 144  K 3.7  CL 105  CO2 29  GLUCOSE 110*  BUN 28*  CREATININE 1.49*  CALCIUM 9.1   GFR: Estimated Creatinine Clearance: 31.4 mL/min (A) (by C-G formula based on SCr of 1.49 mg/dL (H)). Liver Function Tests: Recent Labs  Lab 09/26/20 0547  AST 23  ALT 16  ALKPHOS 45  BILITOT 0.7  PROT 6.5  ALBUMIN 3.9   Recent Labs  Lab 09/26/20 0547  LIPASE 140*   No results for input(s): AMMONIA in the last 168 hours. Coagulation Profile: No results for input(s): INR, PROTIME in the last 168 hours. Cardiac Enzymes: No results for input(s): CKTOTAL, CKMB, CKMBINDEX, TROPONINI in the last 168 hours. BNP (last 3 results) No results for input(s): PROBNP in the last 8760 hours. HbA1C: No results for input(s): HGBA1C in the last 72 hours. CBG: No results for input(s): GLUCAP in the last 168 hours. Lipid Profile: No results for input(s): CHOL, HDL, LDLCALC, TRIG, CHOLHDL, LDLDIRECT in the last 72 hours. Thyroid Function Tests: No results for input(s): TSH, T4TOTAL, FREET4, T3FREE, THYROIDAB in the last 72 hours. Anemia Panel: No results for input(s): VITAMINB12, FOLATE,  FERRITIN, TIBC, IRON, RETICCTPCT in the last 72 hours. Urine analysis:    Component Value Date/Time   COLORURINE YELLOW 02/07/2020 [place] 02/07/2020 0854   LABSPEC 1.019 02/07/2020 0854   PHURINE 5.0 02/07/2020 0854   GLUCOSEU NEGATIVE 02/07/2020 0854   HGBUR NEGATIVE 02/07/2020 0854   BILIRUBINUR NEGATIVE 02/07/2020 0854   KETONESUR 5 (A) 02/07/2020 0854   PROTEINUR 30 (A) 02/07/2020 0854   UROBILINOGEN 0.2 05/06/2015 0140   NITRITE NEGATIVE 02/07/2020 0854   LEUKOCYTESUR NEGATIVE 02/07/2020 0854    Radiological Exams on Admission: CT ABDOMEN PELVIS W CONTRAST  Result Date: 09/26/2020 CLINICAL DATA:  Abdominal pain and diarrhea EXAM: CT ABDOMEN AND PELVIS WITH CONTRAST TECHNIQUE: Multidetector CT imaging of the abdomen and pelvis was performed using the standard protocol following bolus administration of intravenous contrast. CONTRAST:  75mL OMNIPAQUE IOHEXOL 300  MG/ML  SOLN COMPARISON:  November 19, 2018 FINDINGS: Lower chest: Minimal atelectasis of posterior lung bases are noted. Hepatobiliary: Gallstone is noted in the gallbladder. No inflammation is identified around the gallbladder. No focal liver lesion is identified. There is prominence of common hepatic duct measuring 1 cm unchanged compared to prior exam. Pancreas: Unremarkable. No pancreatic ductal dilatation or surrounding inflammatory changes. Spleen: Normal in size without focal abnormality. Adrenals/Urinary Tract: The adrenal glands are normal. Small cysts identified in the right kidney. The left kidney is normal. There is no hydronephrosis bilaterally. The bladder is normal. Stomach/Bowel: There is right inguinal herniation of mesenteric fat and small bowel loops without evidence of bowel obstruction or incarceration. The appearance is unchanged compared prior CT. Patient status post prior surgery of bowel loops in the right lower abdomen. There is no evidence of diverticulitis. The appendix is not seen but no  inflammation is noted around cecum. Vascular/Lymphatic: Aortic atherosclerosis. No enlarged abdominal or pelvic lymph nodes. Reproductive: Prostate is unremarkable. Other: None. Musculoskeletal: Degenerative joint changes of the spine are identified. IMPRESSION: 1. 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 The appearance is unchanged compared to prior CT. 2. Cholelithiasis. 3. Aortic atherosclerosis. Aortic Atherosclerosis (ICD10-I70.0). Electronically Signed   By: Abelardo Diesel M.D.   On: 09/26/2020 08:53   US Abdomen Limited  Result Date: 09/26/2020 CLINICAL DATA:  Follow-up gallstones. EXAM: ULTRASOUND ABDOMEN LIMITED RIGHT UPPER QUADRANT COMPARISON:  CT abdomen pelvis October 9th 2021 FINDINGS: Gallbladder: Gallstones are identified in the gallbladder. There is no pericholecystic fluid. No sonographic Percell Miller sign is reported by the ultrasound technologist. Common bile duct: Diameter: 9 mm.  This is upper limits of normal for patient's age. Liver: Mild inhomogeneous echotexture of liver is identified. Portal vein is patent on color Doppler imaging with normal direction of blood flow towards the liver. Other: None. IMPRESSION: Cholelithiasis without sonographic evidence of acute cholecystitis. Electronically Signed   By: Abelardo Diesel M.D.   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 [name]     - on gleevec  HTN     - on norvasc, lasix, lopressor; continue  HLD     - continue zocor  BPH     - continue flomax  DVT prophylaxis: lovneox  Code Status: FULL  Family Communication: None at bedside  Consults called: General surgery  Admission status: Inpatient.   Status is: Inpatient  Remains inpatient appropriate because:Inpatient level of care appropriate due to  severity of illness   Dispo: The patient is from: Home              Anticipated d/c is to: Home              Anticipated d/c date is: 2 days              Patient currently is not medically stable to d/c.  Jonnie Finner DO [facility]  If 7PM-7AM, please contact night-coverage [email or web address]  09/26/2020, 5:24 PM

## 2020-09-26 NOTE — ED Provider Notes (Signed)
[facility] DEPT Provider Note   CSN: [id number] Arrival date & time: 09/26/20  4259     History Chief Complaint  Patient presents with  . Abdominal Pain    [name] [name] is a 86 y.o. male.  Patient with past medical history notable for GIST with tumor resection, and history of small bowel obstruction presents to the emergency department with chief complaint of abdominal pain.  He states the pain is in his epigastric region.  He denies any fevers, chills, nausea, vomiting.  States that he had a bowel movement yesterday.  He states that he mainly has pain.  Nothing makes his symptoms better or worse.  He sees Dr. [name] from oncology routinely in follow-up.  He denies any chest pain, shortness of breath, or cough.  Denies any pain in his back.  The history is provided by the patient. No language interpreter was used.       Past Medical History:  Diagnosis Date  . Abdominal mass 02/2013  . Acute respiratory failure with hypoxia ([place]) 09/12/2014  . CAD (coronary artery disease)   . CKD (chronic kidney disease), stage III   . GIST (gastrointestinal stroma tumor), malignant, colon ([place])    [name] 03/13/2017  . Hemorrhagic shock ([place]) 09/13/2014  . Hyperlipidemia    TAKES zOCOR DAILY  . Hypertension    TAKES LOTREL,HCTZ,AND METOPROLOL DAILY  . Intraperitoneal hemorrhage 09/17/2014  . NSTEMI (non-ST elevated myocardial infarction) ([place]) 02/2013   "LIGHT: ONE MD SAID HE DID AND ONE SAID HE DIDN'T  . Pneumonia    MAR 2014  . Shortness of breath   . Stromal tumor of digestive system ([place])   . UTI (urinary tract infection)     Patient Active Problem List   Diagnosis Date Noted  . Partial small bowel obstruction ([place]) 04/03/2017  . Abscess of abdominal cavity ([place])   . Ascites   . Sepsis ([place]) 07/26/2015  . Abdominal abscess   . C. difficile diarrhea 05/10/2015  . Urinary retention 05/07/2015  . CKD (chronic kidney disease), stage III ([place])   . SIRS  (systemic inflammatory response syndrome) ([place]) 05/06/2015  . Epigastric pain   . Abdominal pain 01/13/2015  . 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 Diastolic CHF, acute on chronic ([facility]) 09/17/2014  . Hyperlipidemia   . CAD (coronary artery disease)   . Hypertension   . GIST (gastrointestinal stromal tumor) of small bowel, malignant ([place]) 06/20/2013  . Acute kidney failure ([place]) 02/28/2013  . NSTEMI (non-ST elevated myocardial infarction) ([place]) 02/27/2013  . Chronic anemia 02/27/2013    Past Surgical History:  Procedure Laterality Date  . CARDIAC CATHETERIZATION  03-05-13  . COLONOSCOPY  09/08/2014   dr scooler  . CYST EXCISION  1962   wrist  . IR GENERIC HISTORICAL  05/17/2016   IR RADIOLOGIST EVAL & MGMT 05/17/2016 [name], MD [facility] INTERV RAD  . IR GENERIC HISTORICAL  07/26/2016   IR RADIOLOGIST EVAL & MGMT 07/26/2016 [name], MD [facility] INTERV RAD  . LAPAROTOMY N/A 06/04/2013   Procedure: EXPLORATORY LAPAROTOMY, OPEN RESECTION OF MESENTERIC AND INTESTINAL MASS;  Surgeon: [name], MD;  Location: [facility];  Service: General;  Laterality: N/A;  Small Bowel Resection  . LEFT HEART CATHETERIZATION WITH CORONARY ANGIOGRAM N/A 03/05/2013   Procedure: LEFT HEART CATHETERIZATION WITH CORONARY ANGIOGRAM;  Surgeon: [name], MD;  Location: [facility] CATH LAB;  Service: Cardiovascular;  Laterality: N/A;       Family History  Problem Relation  Age of Onset  . Stroke Mother   . Skin cancer Father   . Skin cancer Brother     Social History   Tobacco Use  . Smoking status: Never Smoker  . Smokeless tobacco: Never Used  Substance Use Topics  . Alcohol use: No  . Drug use: No    Home Medications Prior to Admission medications   Medication Sig Start Date End Date Taking? Authorizing Provider  amLODipine (NORVASC) 5 MG tablet Take 5 mg by mouth daily. 03/17/15   [provider]  furosemide (LASIX) 20 MG tablet Take 1 tablet (20 mg total) by mouth daily.  05/20/15   Hosie Poisson, MD  GLEEVEC 400 MG tablet TAKE 1 TABLET (400 MG TOTAL) BY MOUTH DAILY. TAKE WITH MEALS AND LARGE GLASS OF WATER. 06/19/20   Ladell Pier, MD  metoprolol tartrate (LOPRESSOR) 25 MG tablet Take 1 tablet (25 mg total) by mouth 2 (two) times daily. 03/06/13   Caren Griffins, MD  simvastatin (ZOCOR) 20 MG tablet Take 20 mg by mouth every evening.    [provider]  tamsulosin (FLOMAX) 0.4 MG CAPS capsule Take 0.4 mg by mouth daily. 02/04/20   [provider]    Allergies    Patient has no known allergies.  Review of Systems   Review of Systems  All other systems reviewed and are negative.   Physical Exam Updated Vital Signs BP (!) 150/66 (BP Location: Right Arm)   Pulse 67   Temp 98.4 F (36.9 C) (Oral)   Resp 18   Ht 5\' 7"  (1.702 m)   Wt 73.9 kg   SpO2 98%   BMI 25.53 kg/m   Physical Exam Vitals and nursing note reviewed.  Constitutional:      Appearance: He is well-developed.  HENT:     Head: Normocephalic and atraumatic.  Eyes:     Conjunctiva/sclera: Conjunctivae normal.  Cardiovascular:     Rate and Rhythm: Normal rate and regular rhythm.     Heart sounds: No murmur heard.   Pulmonary:     Effort: Pulmonary effort is normal. No respiratory distress.     Breath sounds: Normal breath sounds.  Abdominal:     Palpations: Abdomen is soft.     Tenderness: There is no abdominal tenderness.     Hernia: A hernia is present.  Musculoskeletal:     Cervical back: Neck supple.  Skin:    General: Skin is warm and dry.  Neurological:     Mental Status: He is alert and oriented to person, place, and time.  Psychiatric:        Mood and Affect: Mood normal.        Behavior: Behavior normal.     ED Results / Procedures / Treatments   Labs (all labs ordered are listed, but only abnormal results are displayed) Labs Reviewed  CBC WITH DIFFERENTIAL/PLATELET  LIPASE, BLOOD  COMPREHENSIVE METABOLIC PANEL  LACTIC ACID, PLASMA    TROPONIN I (HIGH SENSITIVITY)    EKG None  Radiology No results found.  Procedures Procedures (including critical care time)  Medications Ordered in ED Medications - No data to display  ED Course  I have reviewed the triage vital signs and the nursing notes.  Pertinent labs & imaging results that were available during my care of the patient were reviewed by me and considered in my medical decision making (see chart for details).    MDM Rules/Calculators/A&P  Patient here with abdominal pain x 4 days.  Gradually worsening.  No BM today.  Hx of SBO.    Will check CT and labs.  Patient signed out to Maralyn Sago, who will follow-up on labs and CT.   Final Clinical Impression(s) / ED Diagnoses Final diagnoses:  None    Rx / DC Orders ED Discharge Orders    None       Montine Circle, PA-C 09/26/20 0636    Palumbo, April, MD 09/26/20 [phone]

## 2020-09-27 ENCOUNTER — Encounter (HOSPITAL_COMMUNITY): Payer: Self-pay | Admitting: Internal Medicine

## 2020-09-27 ENCOUNTER — Inpatient Hospital Stay (HOSPITAL_COMMUNITY): Payer: Medicare Other

## 2020-09-27 DIAGNOSIS — I2510 Atherosclerotic heart disease of native coronary artery without angina pectoris: Secondary | ICD-10-CM

## 2020-09-27 DIAGNOSIS — K819 Cholecystitis, unspecified: Secondary | ICD-10-CM | POA: Diagnosis not present

## 2020-09-27 DIAGNOSIS — I340 Nonrheumatic mitral (valve) insufficiency: Secondary | ICD-10-CM | POA: Diagnosis not present

## 2020-09-27 DIAGNOSIS — I248 Other forms of acute ischemic heart disease: Secondary | ICD-10-CM | POA: Diagnosis not present

## 2020-09-27 LAB — CBC WITH DIFFERENTIAL/PLATELET
Abs Immature Granulocytes: 0.08 10*3/uL — ABNORMAL HIGH (ref 0.00–0.07)
Basophils Absolute: 0 10*3/uL (ref 0.0–0.1)
Basophils Relative: 0 %
Eosinophils Absolute: 0 10*3/uL (ref 0.0–0.5)
Eosinophils Relative: 0 %
HCT: 25.5 % — ABNORMAL LOW (ref 39.0–52.0)
Hemoglobin: 8.5 g/dL — ABNORMAL LOW (ref 13.0–17.0)
Immature Granulocytes: 1 %
Lymphocytes Relative: 4 %
Lymphs Abs: 0.4 10*3/uL — ABNORMAL LOW (ref 0.7–4.0)
MCH: 37 pg — ABNORMAL HIGH (ref 26.0–34.0)
MCHC: 33.3 g/dL (ref 30.0–36.0)
MCV: 110.9 fL — ABNORMAL HIGH (ref 80.0–100.0)
Monocytes Absolute: 0.9 10*3/uL (ref 0.1–1.0)
Monocytes Relative: 8 %
Neutro Abs: 10.2 10*3/uL — ABNORMAL HIGH (ref 1.7–7.7)
Neutrophils Relative %: 87 %
Platelets: 152 10*3/uL (ref 150–400)
RBC: 2.3 MIL/uL — ABNORMAL LOW (ref 4.22–5.81)
RDW: 13.1 % (ref 11.5–15.5)
WBC: 11.6 10*3/uL — ABNORMAL HIGH (ref 4.0–10.5)
nRBC: 0 % (ref 0.0–0.2)

## 2020-09-27 LAB — COMPREHENSIVE METABOLIC PANEL
ALT: 139 U/L — ABNORMAL HIGH (ref 0–44)
AST: 199 U/L — ABNORMAL HIGH (ref 15–41)
Albumin: 3.2 g/dL — ABNORMAL LOW (ref 3.5–5.0)
Alkaline Phosphatase: 93 U/L (ref 38–126)
Anion gap: 8 (ref 5–15)
BUN: 29 mg/dL — ABNORMAL HIGH (ref 8–23)
CO2: 26 mmol/L (ref 22–32)
Calcium: 8.2 mg/dL — ABNORMAL LOW (ref 8.9–10.3)
Chloride: 108 mmol/L (ref 98–111)
Creatinine, Ser: 1.99 mg/dL — ABNORMAL HIGH (ref 0.61–1.24)
GFR, Estimated: 29 mL/min — ABNORMAL LOW (ref >60)
Glucose, Bld: 135 mg/dL — ABNORMAL HIGH (ref 70–99)
Potassium: 3.9 mmol/L (ref 3.5–5.1)
Sodium: 142 mmol/L (ref 135–145)
Total Bilirubin: 1.1 mg/dL (ref 0.3–1.2)
Total Protein: 5.6 g/dL — ABNORMAL LOW (ref 6.5–8.1)

## 2020-09-27 LAB — ECHOCARDIOGRAM COMPLETE
Area-P 1/2: 2.95 cm2
Height: 67 in
S' Lateral: 3.3 cm
Weight: 2608 oz

## 2020-09-27 LAB — TYPE AND SCREEN
ABO/RH(D): O NEG
Antibody Screen: NEGATIVE

## 2020-09-27 LAB — TROPONIN I (HIGH SENSITIVITY)
Troponin I (High Sensitivity): 130 ng/L (ref <18)
Troponin I (High Sensitivity): 102 ng/L (ref <18)

## 2020-09-27 MED ORDER — SODIUM CHLORIDE 0.9 % IV BOLUS
1000.0000 mL | Freq: Once | INTRAVENOUS | Status: AC
  Administered 2020-09-27: 1000 mL via INTRAVENOUS

## 2020-09-27 MED ORDER — IMATINIB MESYLATE 400 MG PO TABS: 400.0000 mg | ORAL_TABLET | Freq: Every day | ORAL | Status: DC

## 2020-09-27 MED ORDER — HYDROMORPHONE HCL 1 MG/ML IJ SOLN
0.5000 mg | INTRAMUSCULAR | Status: DC | PRN
  Administered 2020-09-27: 0.5 mg via INTRAVENOUS
  Filled 2020-09-27: qty 1

## 2020-09-27 MED ORDER — SODIUM CHLORIDE 0.9 % IV SOLN: INTRAVENOUS | Status: DC

## 2020-09-27 MED ORDER — INFLUENZA VAC A&B SA ADJ QUAD 0.5 ML IM PRSY
0.5000 mL | PREFILLED_SYRINGE | INTRAMUSCULAR | Status: DC
  Filled 2020-09-27: qty 0.5

## 2020-09-27 MED ORDER — CALCIUM CARBONATE ANTACID 500 MG PO CHEW
1.0000 | CHEWABLE_TABLET | Freq: Three times a day (TID) | ORAL | Status: DC | PRN
  Administered 2020-09-27 (×2): 200 mg via ORAL
  Filled 2020-09-27 (×2): qty 1

## 2020-09-27 MED ORDER — ENOXAPARIN SODIUM 30 MG/0.3ML ~~LOC~~ SOLN
30.0000 mg | SUBCUTANEOUS | Status: DC
  Administered 2020-09-27 – 2020-09-28 (×2): 30 mg via SUBCUTANEOUS
  Filled 2020-09-27 (×2): qty 0.3

## 2020-09-27 MED ORDER — FENTANYL CITRATE (PF) 100 MCG/2ML IJ SOLN
12.5000 ug | INTRAMUSCULAR | Status: DC | PRN
  Administered 2020-09-27 (×2): 12.5 ug via INTRAVENOUS
  Filled 2020-09-27 (×2): qty 2

## 2020-09-27 MED ORDER — TRAZODONE HCL 50 MG PO TABS
50.0000 mg | ORAL_TABLET | Freq: Once | ORAL | Status: AC
  Administered 2020-09-27: 50 mg via ORAL
  Filled 2020-09-27: qty 1

## 2020-09-27 MED ORDER — NITROGLYCERIN 0.4 MG SL SUBL
SUBLINGUAL_TABLET | SUBLINGUAL | Status: AC
  Administered 2020-09-27: 0.4 mg
  Filled 2020-09-27: qty 3

## 2020-09-27 NOTE — Progress Notes (Signed)
  Echocardiogram 2D Echocardiogram has been performed.  [name] [name] 09/27/2020, 10:22 AM

## 2020-09-27 NOTE — Progress Notes (Signed)
PHARMACIST - PHYSICIAN ORDER COMMUNICATION  CONCERNING: P&T Medication Policy on Oral Chemo Medications  DESCRIPTION:  This patient's order for Gleevec has been noted. Gleevec from home med list was ordered 09/27/20, but pharmacy is holding this med according to the oral chemotherapy policy. Patient met one of the following hold medication-specific conditions.    Imatinib (Gleevec) hold criteria  Hgb < 8  ANC < 1  Pltc < 100K  AST or ALT >5x ULN  (AST/ALT elevated 199/139)  Bilirubin > 3x ULN  Gastrointestinal perforation  Hemorrhage  New or worsened CHF  Severe fluid overload  Active infection currently on ceftriaxone and metronidazole.  Action taken: Gleevec order was discontinued per policy.  Gretta Arab PharmD, BCPS Clinical Pharmacist [facility] main pharmacy [phone] 09/27/2020 12:31 PM

## 2020-09-27 NOTE — ED Notes (Signed)
Echo at bedside

## 2020-09-27 NOTE — Progress Notes (Signed)
Subjective/Chief Complaint: No complaints other than some substernal chest pain   Objective: Vital signs in last 24 hours: Temp:  [98 F (36.7 C)-100.4 F (38 C)] 98 F (36.7 C) (10/10 0219) Pulse Rate:  [34-122] 71 (10/10 0600) Resp:  [12-28] 18 (10/10 0600) BP: (91-164)/(45-97) 106/52 (10/10 0600) SpO2:  [90 %-99 %] 98 % (10/10 0600)    Intake/Output from previous day: No intake/output data recorded. Intake/Output this shift: No intake/output data recorded.  General appearance: alert and cooperative Resp: clear to auscultation bilaterally Cardio: regular rate and rhythm GI: soft, nontender  Lab Results:  Recent Labs    09/26/20 1758 09/27/20 0503  WBC 7.9 11.6*  HGB 10.2* 8.5*  HCT 31.2* 25.5*  PLT 185 152   BMET Recent Labs    09/26/20 0547 09/26/20 0547 09/26/20 1758 09/27/20 0503  NA 144  --   --  142  K 3.7  --   --  3.9  CL 105  --   --  108  CO2 29  --   --  26  GLUCOSE 110*  --   --  135*  BUN 28*  --   --  29*  CREATININE 1.49*   < > 1.25* 1.99*  CALCIUM 9.1  --   --  8.2*   < > = values in this interval not displayed.   PT/INR No results for input(s): LABPROT, INR in the last 72 hours. ABG No results for input(s): PHART, HCO3 in the last 72 hours.  Invalid input(s): PCO2, PO2  Studies/Results: CT ABDOMEN PELVIS W CONTRAST  Result Date: 09/26/2020 CLINICAL DATA:  Abdominal pain and diarrhea EXAM: CT ABDOMEN AND PELVIS WITH CONTRAST TECHNIQUE: Multidetector CT imaging of the abdomen and pelvis was performed using the standard protocol following bolus administration of intravenous contrast. CONTRAST:  62mL OMNIPAQUE IOHEXOL 300 MG/ML  SOLN COMPARISON:  November 19, 2018 FINDINGS: Lower chest: Minimal atelectasis of posterior lung bases are noted. Hepatobiliary: Gallstone is noted in the gallbladder. No inflammation is identified around the gallbladder. No focal liver lesion is identified. There is prominence of common hepatic duct measuring  1 cm unchanged compared to prior exam. Pancreas: Unremarkable. No pancreatic ductal dilatation or surrounding inflammatory changes. Spleen: Normal in size without focal abnormality. Adrenals/Urinary Tract: The adrenal glands are normal. Small cysts identified in the right kidney. The left kidney is normal. There is no hydronephrosis bilaterally. The bladder is normal. Stomach/Bowel: There is right inguinal herniation of mesenteric fat and small bowel loops without evidence of bowel obstruction or incarceration. The appearance is unchanged compared prior CT. Patient status post prior surgery of bowel loops in the right lower abdomen. There is no evidence of diverticulitis. The appendix is not seen but no inflammation is noted around cecum. Vascular/Lymphatic: Aortic atherosclerosis. No enlarged abdominal or pelvic lymph nodes. Reproductive: Prostate is unremarkable. Other: None. Musculoskeletal: Degenerative joint changes of the spine are identified. IMPRESSION: 1. 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 The appearance is unchanged compared to prior CT. 2. Cholelithiasis. 3. Aortic atherosclerosis. Aortic Atherosclerosis (ICD10-I70.0). Electronically Signed   By: Abelardo Diesel M.D.   On: 09/26/2020 08:53   US Abdomen Limited  Result Date: 09/26/2020 CLINICAL DATA:  Follow-up gallstones. EXAM: ULTRASOUND ABDOMEN LIMITED RIGHT UPPER QUADRANT COMPARISON:  CT abdomen pelvis October 9th 2021 FINDINGS: Gallbladder: Gallstones are identified in the gallbladder. There is no pericholecystic fluid. No sonographic Percell Miller sign is reported by the ultrasound  technologist. Common bile duct: Diameter: 9 mm.  This is upper limits of normal for patient's age. Liver: Mild inhomogeneous echotexture of liver is identified. Portal vein is patent on color Doppler imaging with normal direction of blood flow towards the liver. Other: None. IMPRESSION:  Cholelithiasis without sonographic evidence of acute cholecystitis. Electronically Signed   By: Abelardo Diesel M.D.   On: 09/26/2020 14:08    Anti-infectives: Anti-infectives (From admission, onward)   Start     Dose/Rate Route Frequency Ordered Stop   09/26/20 1730  cefTRIAXone (ROCEPHIN) 2 g in sodium chloride 0.9 % 100 mL IVPB        2 g 200 mL/hr over 30 Minutes Intravenous Every 24 hours 09/26/20 1724     09/26/20 1730  metroNIDAZOLE (FLAGYL) IVPB 500 mg        500 mg 100 mL/hr over 60 Minutes Intravenous Every 8 hours 09/26/20 1724        Assessment/Plan: s/p * No surgery found * the patient does have gallstones but it is not clear if this is the source of his chest pain. He has had an elevated troponin and will need cardiac evaluation before surgery will be considered. lft's are elevated today so he could have passed a stone. If lft's do not improve then he will also need a GI eval. We will follow with you  LOS: 1 day    Autumn Messing III 09/27/2020

## 2020-09-27 NOTE — Progress Notes (Addendum)
PROGRESS NOTE    [name] [name]  ZOX:[id number] DOB: 04-Mar-1929 DOA: 09/26/2020 PCP: Orpah Melter, MD     Brief Narrative:  [name] [name] is a 86 y.o. 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 He is presenting with epigastric abdominal pain that started 4 days ago. It is constant, sharp and non-radiating. OTC medications did not help relieve the pain. He does not identify anything specifically that makes the pain worse. He had a strong flare this morning 8/10 and decided that it was time to come to the ED. He denies any other treatment. Imaging revealed cholelithiasis/questioned cholecystitis. He had mildly elevated troponin of 25.   New events last 24 hours / Subjective: He continues to have substernal/epigastric pain that he describes as a "toothache" kind of a pain.  He denies any nausea or vomiting.    Assessment & Plan:   Active Problems:   Cholecystitis   Epigastric abdominal pain/symptomatic cholecystitis -Right upper quadrant ultrasound showed cholelithiasis -General surgery following -Empiric Rocephin, Flagyl  Elevated troponin -Troponin has remained flat 25 --> 25 -Obtain echocardiogram, if abnormal or if any change in troponin, will consult cardiology Addendum: trop 130. Echo without regional wall motion abnl. Due to his cardiac hx with LAD disease from heart cath in 2014, will consult cardiology to weigh in.   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 Malachy Mood -Gleevec is on hold due to empiric antibiotic therapy as above  Essential hypertension -Continue Norvasc, Lopressor  BPH -Continue Flomax    DVT prophylaxis:  enoxaparin (LOVENOX) injection 40 mg Start: 09/26/20 2000  Code Status: Full code Family Communication: Spoke with son over the  phone Disposition Plan:  Status is: Inpatient  Remains inpatient appropriate because:Ongoing diagnostic testing needed not appropriate for outpatient work up and Inpatient level of care appropriate due to severity of illness   Dispo: The patient is from: Home              Anticipated d/c is to: Home              Anticipated d/c date is: 2 days              Patient currently is not medically stable to d/c.  Continued work-up as above.  Remains on IV antibiotics   Consultants:   General surgery  Procedures:   None  Antimicrobials:  Anti-infectives (From admission, onward)   Start     Dose/Rate Route Frequency Ordered Stop   09/26/20 1730  cefTRIAXone (ROCEPHIN) 2 g in sodium chloride 0.9 % 100 mL IVPB        2 g 200 mL/hr over 30 Minutes Intravenous Every 24 hours 09/26/20 1724     09/26/20 1730  metroNIDAZOLE (FLAGYL) IVPB 500 mg        500 mg 100 mL/hr over 60 Minutes Intravenous Every 8 hours 09/26/20 1724          Objective: Vitals:   09/27/20 0830 09/27/20 0930 09/27/20 1000 09/27/20 1226  BP: (!) 114/49 (!) 119/51 107/71 127/63  Pulse: 66 70 76 77  Resp: 19  (!) 21 18  Temp:      TempSrc:      SpO2: 97% 95% 96% 97%  Weight:      Height:        Intake/Output Summary (Last  24 hours) at 09/27/2020 1308 Last data filed at 09/27/2020 0848 Gross per 24 hour  Intake 999 ml  Output --  Net 999 ml   Filed Weights   09/26/20 0533  Weight: 73.9 kg    Examination:  General exam: Appears calm and comfortable  Respiratory system: Clear to auscultation. Respiratory effort normal. No respiratory distress. No conversational dyspnea.  Cardiovascular system: S1 & S2 heard, RRR. No murmurs. No pedal edema. Gastrointestinal system: Abdomen is nondistended, soft and nontender. Normal bowel sounds heard. Central nervous system: Alert and oriented. No focal neurological deficits. Speech clear.  Extremities: Symmetric in appearance  Skin: No rashes, lesions or ulcers on  exposed skin  Psychiatry: Judgement and insight appear normal. Mood & affect appropriate.   Data Reviewed: I have personally reviewed following labs and imaging studies  CBC: Recent Labs  Lab 09/26/20 0547 09/26/20 1758 09/27/20 0503  WBC 6.0 7.9 11.6*  NEUTROABS 4.4  --  10.2*  HGB 10.1* 10.2* 8.5*  HCT 30.6* 31.2* 25.5*  MCV 110.5* 111.0* 110.9*  PLT 209 185 073   Basic Metabolic Panel: Recent Labs  Lab 09/26/20 0547 09/26/20 1758 09/27/20 0503  NA 144  --  142  K 3.7  --  3.9  CL 105  --  108  CO2 29  --  26  GLUCOSE 110*  --  135*  BUN 28*  --  29*  CREATININE 1.49* 1.25* 1.99*  CALCIUM 9.1  --  8.2*   GFR: Estimated Creatinine Clearance: 23.5 mL/min (A) (by C-G formula based on SCr of 1.99 mg/dL (H)). Liver Function Tests: Recent Labs  Lab 09/26/20 0547 09/27/20 0503  AST 23 199*  ALT 16 139*  ALKPHOS 45 93  BILITOT 0.7 1.1  PROT 6.5 5.6*  ALBUMIN 3.9 3.2*   Recent Labs  Lab 09/26/20 0547  LIPASE 140*   No results for input(s): AMMONIA in the last 168 hours. Coagulation Profile: No results for input(s): INR, PROTIME in the last 168 hours. Cardiac Enzymes: No results for input(s): CKTOTAL, CKMB, CKMBINDEX, TROPONINI in the last 168 hours. BNP (last 3 results) No results for input(s): PROBNP in the last 8760 hours. HbA1C: No results for input(s): HGBA1C in the last 72 hours. CBG: No results for input(s): GLUCAP in the last 168 hours. Lipid Profile: No results for input(s): CHOL, HDL, LDLCALC, TRIG, CHOLHDL, LDLDIRECT in the last 72 hours. Thyroid Function Tests: No results for input(s): TSH, T4TOTAL, FREET4, T3FREE, THYROIDAB in the last 72 hours. Anemia Panel: No results for input(s): VITAMINB12, FOLATE, FERRITIN, TIBC, IRON, RETICCTPCT in the last 72 hours. Sepsis Labs: Recent Labs  Lab 09/26/20 0547  LATICACIDVEN 1.1    Recent Results (from the past 240 hour(s))  Respiratory Panel by RT PCR (Flu A&B, Covid) - Nasopharyngeal Swab      Status: None   Collection Time: 09/26/20  3:04 PM   Specimen: Nasopharyngeal Swab  Result Value Ref Range Status   SARS Coronavirus 2 by RT PCR NEGATIVE NEGATIVE Final    Comment: (NOTE) SARS-CoV-2 target nucleic acids are NOT DETECTED.  The SARS-CoV-2 RNA is generally detectable in upper respiratoy specimens during the acute phase of infection. The lowest concentration of SARS-CoV-2 viral copies this assay can detect is 131 copies/mL. A negative result does not preclude SARS-Cov-2 infection and should not be used as the sole basis for treatment or other patient management decisions. A negative result may occur with  improper specimen collection/handling, submission of specimen other than nasopharyngeal swab,  presence of viral mutation(s) within the areas targeted by this assay, and inadequate number of viral copies (<131 copies/mL). A negative result must be combined with clinical observations, patient history, and epidemiological information. The expected result is Negative.  Fact Sheet for Patients:  PinkCheek.be  Fact Sheet for Healthcare Providers:  GravelBags.it  This test is no t yet approved or cleared by the [place] FDA and  has been authorized for detection and/or diagnosis of SARS-CoV-2 by FDA under an Emergency Use Authorization (EUA). This EUA will remain  in effect (meaning this test can be used) for the duration of the COVID-19 declaration under Section 564(b)(1) of the Act, 21 U.S.C. section 360bbb-3(b)(1), unless the authorization is terminated or revoked sooner.     Influenza A by PCR NEGATIVE NEGATIVE Final   Influenza B by PCR NEGATIVE NEGATIVE Final    Comment: (NOTE) The Xpert Xpress SARS-CoV-2/FLU/RSV assay is intended as an aid in  the diagnosis of influenza from Nasopharyngeal swab specimens and  should not be used as a sole basis for treatment. Nasal washings and  aspirates are  unacceptable for Xpert Xpress SARS-CoV-2/FLU/RSV  testing.  Fact Sheet for Patients: PinkCheek.be  Fact Sheet for Healthcare Providers: GravelBags.it  This test is not yet approved or cleared by the [place] FDA and  has been authorized for detection and/or diagnosis of SARS-CoV-2 by  FDA under an Emergency Use Authorization (EUA). This EUA will remain  in effect (meaning this test can be used) for the duration of the  Covid-19 declaration under Section 564(b)(1) of the Act, 21  U.S.C. section 360bbb-3(b)(1), unless the authorization is  terminated or revoked. Performed at [facility], [place] [address], [place], [place] [address]       Radiology Studies: CT ABDOMEN PELVIS W CONTRAST  Result Date: 09/26/2020 CLINICAL DATA:  Abdominal pain and diarrhea EXAM: CT ABDOMEN AND PELVIS WITH CONTRAST TECHNIQUE: Multidetector CT imaging of the abdomen and pelvis was performed using the standard protocol following bolus administration of intravenous contrast. CONTRAST:  85mL OMNIPAQUE IOHEXOL 300 MG/ML  SOLN COMPARISON:  November 19, 2018 FINDINGS: Lower chest: Minimal atelectasis of posterior lung bases are noted. Hepatobiliary: Gallstone is noted in the gallbladder. No inflammation is identified around the gallbladder. No focal liver lesion is identified. There is prominence of common hepatic duct measuring 1 cm unchanged compared to prior exam. Pancreas: Unremarkable. No pancreatic ductal dilatation or surrounding inflammatory changes. Spleen: Normal in size without focal abnormality. Adrenals/Urinary Tract: The adrenal glands are normal. Small cysts identified in the right kidney. The left kidney is normal. There is no hydronephrosis bilaterally. The bladder is normal. Stomach/Bowel: There is right inguinal herniation of mesenteric fat and small bowel loops without evidence of bowel obstruction or incarceration. The  appearance is unchanged compared prior CT. Patient status post prior surgery of bowel loops in the right lower abdomen. There is no evidence of diverticulitis. The appendix is not seen but no inflammation is noted around cecum. Vascular/Lymphatic: Aortic atherosclerosis. No enlarged abdominal or pelvic lymph nodes. Reproductive: Prostate is unremarkable. Other: None. Musculoskeletal: Degenerative joint changes of the spine are identified. IMPRESSION: 1. 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 The appearance is unchanged compared to prior CT. 2. Cholelithiasis. 3. Aortic atherosclerosis. Aortic Atherosclerosis (ICD10-I70.0). Electronically Signed   By: Abelardo Diesel M.D.   On: 09/26/2020 08:53   US Abdomen Limited  Result Date: 09/26/2020 CLINICAL DATA:  Follow-up gallstones.  EXAM: ULTRASOUND ABDOMEN LIMITED RIGHT UPPER QUADRANT COMPARISON:  CT abdomen pelvis October 9th 2021 FINDINGS: Gallbladder: Gallstones are identified in the gallbladder. There is no pericholecystic fluid. No sonographic Percell Miller sign is reported by the ultrasound technologist. Common bile duct: Diameter: 9 mm.  This is upper limits of normal for patient's age. Liver: Mild inhomogeneous echotexture of liver is identified. Portal vein is patent on color Doppler imaging with normal direction of blood flow towards the liver. Other: None. IMPRESSION: Cholelithiasis without sonographic evidence of acute cholecystitis. Electronically Signed   By: Abelardo Diesel M.D.   On: 09/26/2020 14:08      Scheduled Meds: . amLODipine  5 mg Oral Daily  . enoxaparin (LOVENOX) injection  40 mg Subcutaneous Q24H  . furosemide  20 mg Oral Daily  . metoprolol tartrate  25 mg Oral BID  . simvastatin  20 mg Oral QPM  . tamsulosin  0.4 mg Oral Daily   Continuous Infusions: . cefTRIAXone (ROCEPHIN)  IV Stopped (09/26/20 1830)  . metronidazole Stopped (09/27/20 1050)      LOS: 1 day      Time spent: 35 minutes   Dessa Phi, DO [facility] 09/27/2020, 1:08 PM   Available via Epic secure chat 7am-7pm After these hours, please refer to coverage provider listed on [email or web address]

## 2020-09-27 NOTE — ED Notes (Signed)
Provider notified of pt B/Ps being 90s/40s and 90s/50s. MAP remains above 60. No new orders at this time

## 2020-09-27 NOTE — Consult Note (Signed)
Cardiology Consultation:   Patient ID: [name] [name] MRN: [id number]; DOB: [birth year]  Admit date: 09/26/2020 Date of Consult: 09/27/2020  Primary Care Provider: Orpah Melter, MD [facility] Cardiologist: No primary care provider on file.  [facility] Electrophysiologist:  None    Patient Profile:   [name] [name] is a 86 y.o. 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 Marlou Starks.  History of Present Illness:   [name] [name] presented to the hospital 10/9 with four days of epigastric pain. Abdominal ultrasound revealed cholelithiasis without findings of acute cholecystitis.  CT showed aortic atherosclerosis and cholelithiasis.  AST and ALT were normal on admission but AST/ALT rose to 199/139 respectively.  Creatinine has increased from 1.4 to 1.99.  Bilirubin remained normal.  High sensitivity troponin was mildly elevated to 25 x2 on 10/9.  Today it increased to 130.  EKG revealed sinus rhythm with PVCs and no acute ischemic changes. He was seen by general surgery and his pain was thought to be more chest pain than abdominal.  Given his elevated cardiac enzymes they requested cardiology consultation.  He reports that he is currently not in pain but did have abdominal discomfort nearly all day yesterday.  The pain is epigastric and up to 9 out of 10 in severity.  He reports it is a deep, intense pain.  There is no associated shortness of breath.  He denies any diaphoresis, nausea, or vomiting.  [name] [name] had an NSTEMI and underwent LHC.  He has known LAD occlusion with L-->L collaterals to D1.  He also had R-->L collaterals from a septal perforator to the distal LAD.  Medical management was recommended. He was admitted in 2015 with acute diastolic heart failure and volume overload requiring intubation.  That hospital course was  complicated by RP bleed on heparin and recurrent NSTEMI.  He last saw cardiology during inpatient consultation in 2018.  At that time he reported chest pain and cardiac enzymes.  His symptoms were thought to be GI related.  Lately he has been walking for exercise 3 to 4 days/week.  He has no exertional chest pain or shortness of breath.  He denies any palpitations, lightheadedness, or dizziness.  He also lifts weights for exercise and generally feels well.  He and his wife live independently and do all their ADLs.  Past Medical History:  Diagnosis Date  . Abdominal mass 02/2013  . Acute respiratory failure with hypoxia ([place]) 09/12/2014  . CAD (coronary artery disease)   . CKD (chronic kidney disease), stage III   . GIST (gastrointestinal stroma tumor), malignant, colon ([place])    [name] [name] 03/13/2017  . Hemorrhagic shock ([place]) 09/13/2014  . Hyperlipidemia    [name] [name]  . Hypertension    [name] LOTREL,HCTZ,AND METOPROLOL [name]  . Intraperitoneal hemorrhage 09/17/2014  . NSTEMI (non-ST elevated myocardial infarction) ([place]) 02/2013   "LIGHT: ONE MD SAID HE DID AND ONE SAID HE DIDN'T  . Pneumonia    MAR 2014  . Shortness of breath   . Stromal tumor of digestive system ([place])   . UTI (urinary tract infection)     Past Surgical History:  Procedure Laterality Date  . CARDIAC CATHETERIZATION  03-05-13  . COLONOSCOPY  09/08/2014   dr scooler  . CYST EXCISION  1962   wrist  . IR GENERIC HISTORICAL  05/17/2016  IR RADIOLOGIST EVAL & MGMT 05/17/2016 [name] Keen, MD [facility] INTERV RAD  . IR GENERIC HISTORICAL  07/26/2016   IR RADIOLOGIST EVAL & MGMT 07/26/2016 [name] Cadet, MD [facility] INTERV RAD  . LAPAROTOMY N/A 06/04/2013   Procedure: EXPLORATORY LAPAROTOMY, OPEN RESECTION OF MESENTERIC AND INTESTINAL MASS;  Surgeon: [name] Hector, MD;  Location: [place];  Service: General;  Laterality: N/A;  Small Bowel Resection  . LEFT HEART CATHETERIZATION WITH CORONARY ANGIOGRAM N/A 03/05/2013   Procedure:  LEFT HEART CATHETERIZATION WITH CORONARY ANGIOGRAM;  Surgeon: [name] M Martinique, MD;  Location[facility] C[facility];  Service: Cardiovascular;  Laterality: N/A;     Home Medications:  Prior to Admission medications   Medication Sig Start Date End Date Taking? Authorizing Provider  amLODipine (NORVASC) 5 MG tablet Take 5 mg by mouth [name]. 03/17/15  Yes [provider]  furosemide (LASIX) 20 MG tablet Take 1 tablet (20 mg total) by mouth [name]. 05/20/15  Yes [name] Poisson, MD  GLEEVEC 400 MG tablet TAKE 1 TABLET (400 MG TOTAL) BY MOUTH [name]. TAKE WITH MEALS AND LARGE GLASS OF WATER. 06/19/20  Yes [name] Pier, MD  metoprolol tartrate (LOPRESSOR) 25 MG tablet Take 1 tablet (25 mg total) by mouth 2 (two) times [name]. 03/06/13  Yes [name] Griffins, MD  simvastatin (ZOCOR) 20 MG tablet Take 20 mg by mouth every evening.   Yes [provider]  tamsulosin (FLOMAX) 0.4 MG CAPS capsule Take 0.4 mg by mouth [name]. 02/04/20  Yes [provider]    Inpatient Medications: Scheduled Meds: . amLODipine  5 mg Oral [name]  . enoxaparin (LOVENOX) injection  30 mg Subcutaneous Q24H  . metoprolol tartrate  25 mg Oral BID  . tamsulosin  0.4 mg Oral [name]   Continuous Infusions: . sodium chloride    . cefTRIAXone (ROCEPHIN)  IV Stopped (09/26/20 1830)  . metronidazole Stopped (09/27/20 1050)   PRN Meds: acetaminophen **OR** acetaminophen, fentaNYL (SUBLIMAZE) injection, ondansetron **OR** ondansetron (ZOFRAN) IV  Allergies:   No Known Allergies  Social History:   Social History   Socioeconomic History  . Marital status: Married    Spouse name: [name] [name]  . Number of children: 2  . Years of education: Not on file  . Highest education level: Not on file  Occupational History  . Occupation: Retired    Comment: Writer  Tobacco Use  . Smoking status: Never Smoker  . Smokeless tobacco: Never Used  Substance and Sexual Activity  . Alcohol use: No  . Drug use: No  . Sexual  activity: Never  Other Topics Concern  . Not on file  Social History Narrative   Married, wife [name] [name]   Retired Environmental manager for 50 years   Independent in [place]   #2 grown sons   Social Determinants of Health   Financial Resource Strain:   . Difficulty of Paying Living Expenses: Not on file  Food Insecurity:   . Worried About Charity fundraiser in the Last Year: Not on file  . Ran Out of Food in the Last Year: Not on file  Transportation Needs:   . Lack of Transportation (Medical): Not on file  . Lack of Transportation (Non-Medical): Not on file  Physical Activity:   . Days of Exercise per Week: Not on file  . Minutes of Exercise per Session: Not on file  Stress:   . Feeling of Stress : Not on file  Social Connections:   . Frequency  of Communication with Friends and Family: Not on file  . Frequency of Social Gatherings with Friends and Family: Not on file  . Attends Religious Services: Not on file  . Active Member of Clubs or Organizations: Not on file  . Attends Archivist Meetings: Not on file  . Marital Status: Not on file  Intimate Partner Violence:   . Fear of Current or Ex-Partner: Not on file  . Emotionally Abused: Not on file  . Physically Abused: Not on file  . Sexually Abused: Not on file    Family History:    Family History  Problem Relation Age of Onset  . Stroke Mother   . Skin cancer Father   . Skin cancer Brother      ROS:  Please see the history of present illness.   All other ROS reviewed and negative.     Physical Exam/Data:   Vitals:   09/27/20 0830 09/27/20 0930 09/27/20 1000 09/27/20 1226  BP: (!) 114/49 (!) 119/51 107/71 127/63  Pulse: 66 70 76 77  Resp: 19  (!) 21 18  Temp:      TempSrc:      SpO2: 97% 95% 96% 97%  Weight:      Height:        Intake/Output Summary (Last 24 hours) at 09/27/2020 1336 Last data filed at 09/27/2020 0848 Gross per 24 hour  Intake 999 ml  Output --  Net 999 ml   Last  3 Weights 09/26/2020 08/05/2020 05/05/2020  Weight (lbs) 163 lb 165 lb 3.2 oz 164 lb 11.2 oz  Weight (kg) 73.936 kg 74.934 kg 74.707 kg     VS:  BP 127/63   Pulse 77   Temp 98 F (36.7 C) (Oral)   Resp 18   Ht 5\' 7"  (1.702 m)   Wt 73.9 kg   SpO2 97%   BMI 25.53 kg/m  , BMI Body mass index is 25.53 kg/m. GENERAL:  Well appearing HEENT: Pupils equal round and reactive, fundi not visualized, oral mucosa unremarkable NECK:  No jugular venous distention, waveform within normal limits, carotid upstroke brisk and symmetric, no bruits, no thyromegaly LYMPHATICS:  No cervical adenopathy LUNGS:  Clear to auscultation bilaterally HEART:  RRR.  PMI not displaced or sustained,S1 and S2 within normal limits, no S3, no S4, no clicks, no rubs, II/VI sysotlic murmur at the LUSB ABD:  Flat, positive bowel sounds normal in frequency in pitch, no bruits, no rebound, no guarding, no midline pulsatile mass, no hepatomegaly, no splenomegaly EXT:  2 plus pulses throughout, no edema, no cyanosis no clubbing SKIN:  No rashes no nodules NEURO:  Cranial nerves II through XII grossly intact, motor grossly intact throughout PSYCH:  Cognitively intact, oriented to person place and time  EKG:  The EKG was personally reviewed and demonstrates:  Sinus rhythm.  Rate 83 bpm.  PVCs.  Telemetry:  Telemetry was personally reviewed and demonstrates: Sinus rhythm.  PVCs.  Relevant CV Studies:  Echo 09/26/20: IMPRESSIONS   1. Left ventricular ejection fraction, by estimation, is 60 to 65%. The  left ventricle has normal function. The left ventricle has no regional  wall motion abnormalities. Left ventricular diastolic parameters are  indeterminate.  2. Right ventricular systolic function is normal. The right ventricular  size is mildly enlarged. Tricuspid regurgitation signal is inadequate for  assessing PA pressure.  3. Left atrial size was moderately dilated.  4. Right atrial size was mild to moderately dilated.   5. The  mitral valve is grossly normal. Mild mitral valve regurgitation.  6. The aortic valve is grossly normal. Aortic valve regurgitation is not  visualized.   Laboratory Data:  High Sensitivity Troponin:   Recent Labs  Lab 09/26/20 0547 09/26/20 0734 09/27/20 1125  TROPONINIHS 25* 25* 130*     Chemistry Recent Labs  Lab 09/26/20 0547 09/26/20 1758 09/27/20 0503  NA 144  --  142  K 3.7  --  3.9  CL 105  --  108  CO2 29  --  26  GLUCOSE 110*  --  135*  BUN 28*  --  29*  CREATININE 1.49* 1.25* 1.99*  CALCIUM 9.1  --  8.2*  GFRNONAA 41* 51* 29*  ANIONGAP 10  --  8    Recent Labs  Lab 09/26/20 0547 09/27/20 0503  PROT 6.5 5.6*  ALBUMIN 3.9 3.2*  AST 23 199*  ALT 16 139*  ALKPHOS 45 93  BILITOT 0.7 1.1   Hematology Recent Labs  Lab 09/26/20 0547 09/26/20 1758 09/27/20 0503  WBC 6.0 7.9 11.6*  RBC 2.77* 2.81* 2.30*  HGB 10.1* 10.2* 8.5*  HCT 30.6* 31.2* 25.5*  MCV 110.5* 111.0* 110.9*  MCH 36.5* 36.3* 37.0*  MCHC 33.0 32.7 33.3  RDW 12.9 12.7 13.1  PLT 209 185 152   BNPNo results for input(s): BNP, PROBNP in the last 168 hours.  DDimer No results for input(s): DDIMER in the last 168 hours.   Radiology/Studies:  CT ABDOMEN PELVIS W CONTRAST  Result Date: 09/26/2020 CLINICAL DATA:  Abdominal pain and diarrhea EXAM: CT ABDOMEN AND PELVIS WITH CONTRAST TECHNIQUE: Multidetector CT imaging of the abdomen and pelvis was performed using the standard protocol following bolus administration of intravenous contrast. CONTRAST:  8mL OMNIPAQUE IOHEXOL 300 MG/ML  SOLN COMPARISON:  November 19, 2018 FINDINGS: Lower chest: Minimal atelectasis of posterior lung bases are noted. Hepatobiliary: Gallstone is noted in the gallbladder. No inflammation is identified around the gallbladder. No focal liver lesion is identified. There is prominence of common hepatic duct measuring 1 cm unchanged compared to prior exam. Pancreas: Unremarkable. No pancreatic ductal dilatation or  surrounding inflammatory changes. Spleen: Normal in size without focal abnormality. Adrenals/Urinary Tract: The adrenal glands are normal. Small cysts identified in the right kidney. The left kidney is normal. There is no hydronephrosis bilaterally. The bladder is normal. Stomach/Bowel: There is right inguinal herniation of mesenteric fat and small bowel loops without evidence of bowel obstruction or incarceration. The appearance is unchanged compared prior CT. Patient status post prior surgery of bowel loops in the right lower abdomen. There is no evidence of diverticulitis. The appendix is not seen but no inflammation is noted around cecum. Vascular/Lymphatic: Aortic atherosclerosis. No enlarged abdominal or pelvic lymph nodes. Reproductive: Prostate is unremarkable. Other: None. Musculoskeletal: Degenerative joint changes of the spine are identified. IMPRESSION: 1. 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 The appearance is unchanged compared to prior CT. 2. Cholelithiasis. 3. Aortic atherosclerosis. Aortic Atherosclerosis (ICD10-I70.0). Electronically Signed   By: Abelardo Diesel M.D.   On: 09/26/2020 08:53   US Abdomen Limited  Result Date: 09/26/2020 CLINICAL DATA:  Follow-up gallstones. EXAM: ULTRASOUND ABDOMEN LIMITED RIGHT UPPER QUADRANT COMPARISON:  CT abdomen pelvis October 9th 2021 FINDINGS: Gallbladder: Gallstones are identified in the gallbladder. There is no pericholecystic fluid. No sonographic Percell Miller sign is reported by the ultrasound technologist. Common bile duct: Diameter: 9 mm.  This is upper limits of normal for  patient's age. Liver: Mild inhomogeneous echotexture of liver is identified. Portal vein is patent on color Doppler imaging with normal direction of blood flow towards the liver. Other: None. IMPRESSION: Cholelithiasis without sonographic evidence of acute cholecystitis. Electronically Signed   By:  Abelardo Diesel M.D.   On: 09/26/2020 14:08   ECHOCARDIOGRAM COMPLETE  Result Date: 09/27/2020    ECHOCARDIOGRAM REPORT   Patient Name:   WILBER FINI Date of Exam: 09/27/2020 Medical Rec #:  [id number]  Height:       67.0 in Accession #:    [id number] Weight:       163.0 lb Date of Birth:  1929-11-10   BSA:          1.854 m Patient Age:    58 years   BP:           107/71 mmHg Patient Gender: M          HR:           76 bpm. Exam Location:  Inpatient Procedure: 2D Echo Indications:    chest pain 786.50  History:        Patient has prior history of Echocardiogram examinations, most                 recent 01/13/2015. CAD; Risk Factors:Hypertension and                 Dyslipidemia. NSTEMI.  Sonographer:    Jannett Celestine RDCS (AE) Referring Phys: [id number] JENNIFER CHOI IMPRESSIONS  1. Left ventricular ejection fraction, by estimation, is 60 to 65%. The left ventricle has normal function. The left ventricle has no regional wall motion abnormalities. Left ventricular diastolic parameters are indeterminate.  2. Right ventricular systolic function is normal. The right ventricular size is mildly enlarged. Tricuspid regurgitation signal is inadequate for assessing PA pressure.  3. Left atrial size was moderately dilated.  4. Right atrial size was mild to moderately dilated.  5. The mitral valve is grossly normal. Mild mitral valve regurgitation.  6. The aortic valve is grossly normal. Aortic valve regurgitation is not visualized. Comparison(s): Compared to 2016 study, LVEF appears improbed. RV size is stable, unable to estimate RVSP in this study. FINDINGS  Left Ventricle: Left ventricular ejection fraction, by estimation, is 60 to 65%. The left ventricle has normal function. The left ventricle has no regional wall motion abnormalities. The left ventricular internal cavity size was normal in size. There is  no left ventricular hypertrophy. Left ventricular diastolic parameters are indeterminate. Right Ventricle: The right  ventricular size is mildly enlarged. No increase in right ventricular wall thickness. Right ventricular systolic function is normal. Tricuspid regurgitation signal is inadequate for assessing PA pressure. Left Atrium: Left atrial size was moderately dilated. Right Atrium: Right atrial size was mild to moderately dilated. Pericardium: There is no evidence of pericardial effusion. Mitral Valve: The mitral valve is grossly normal. Mild mitral valve regurgitation. Tricuspid Valve: The tricuspid valve is grossly normal. Tricuspid valve regurgitation is trivial. Aortic Valve: The aortic valve is grossly normal. Aortic valve regurgitation is not visualized. Pulmonic Valve: The pulmonic valve was not well visualized. Pulmonic valve regurgitation is trivial. Aorta: The ascending aorta was not well visualized, the aortic arch was not well visualized and the aortic root was not well visualized. IAS/Shunts: The atrial septum is grossly normal.  LEFT VENTRICLE PLAX 2D LVIDd:         5.50 cm  Diastology LVIDs:         3.30  cm  LV e' medial:   7.40 cm/s LV PW:         1.10 cm  LV E/e' medial: 11.2 LV IVS:        0.90 cm LVOT diam:     1.70 cm LV SV:         45 LV SV Index:   24 LVOT Area:     2.27 cm  RIGHT VENTRICLE RV S prime:     18.10 cm/s TAPSE (M-mode): 2.0 cm LEFT ATRIUM             Index       RIGHT ATRIUM           Index LA diam:        4.00 cm 2.16 cm/m  RA Area:     14.80 cm LA Vol (A2C):   51.9 ml 28.00 ml/m RA Volume:   28.10 ml  15.16 ml/m LA Vol (A4C):   44.9 ml 24.22 ml/m LA Biplane Vol: 51.6 ml 27.83 ml/m  AORTIC VALVE LVOT Vmax:   85.00 cm/s LVOT Vmean:  60.000 cm/s LVOT VTI:    0.200 m  AORTA Ao Root diam: 3.10 cm MITRAL VALVE MV Area (PHT): 2.95 cm    SHUNTS MV Decel Time: 257 msec    Systemic VTI:  0.20 m MV E velocity: 83.10 cm/s  Systemic Diam: 1.70 cm Rudean Haskell MD Electronically signed by Rudean Haskell MD Signature Date/Time: 09/27/2020/1:28:19 PM    Final      Assessment and  Plan:   # Obstructive CAD:  # Demand ischemia: # Hyperlipidemia: Mr. Kilmer has known obstructive LAD disease with collateralization.  At baseline he was exercising several days a week and had no exertional symptoms.  High-sensitivity troponin is mildly elevated.  EKG is without acute ischemic changes.  No plans for any further ischemic valuation at this time, especially given that he has had retroperitoneal bleeding in the past on IV heparin and has no ischemic symptoms.  He is not on aspirin due to his bleeding history.  Continue metoprolol and his statin is currently on hold in the setting of his acute illness.  Recommend resuming once LFTs normalize.  # Pre-operative risk assessment: HS-troponin is elevated. However he has no cardiac symptoms.  His discomfort is epigastric and not typical for ischemia.  It seems related to his cholecystitis.  At baseline he can achieve 4 METs or greater without anginal symptoms.  He is not a good candidate for cath due to retroperitoneal bleed on heparin.  He doesn't take [name] aspirin due to this bleeding history.  According to [facility] and AHA guidelines, he requires no further cardiac workup prior to his noncardiac surgery and should be at acceptable risk.  his NSQIP risk of peri-procedural MI or cardiac arrest is elevated at 1.1%.  His risk is higher given his CAD history which isn't accounted for in this risk model.  However, there are no options for optimization.  Our service is available as necessary in the perioperative period.    For questions or updates, please contact [place] Please consult [email or web address] for contact info under    Signed, Skeet Latch, MD  09/27/2020 1:36 PM

## 2020-09-27 NOTE — ED Notes (Signed)
Provider made aware of B/P 80s/50s. No new orders at this time

## 2020-09-27 NOTE — Progress Notes (Signed)
Pt c/o chest pain which he reported 6 on scale of (0-10). Prn fentanyl given and pt report pain down to 3 but remains unrelieved. Standing order nitroglycerin adm x2 and pt voices relief. Will continue to closely monitor. Pt resting comfortably in bed with call light within reach. Francis Gaines Renard Caperton RN   09/27/20 2311  Vitals  BP (!) 113/51  MAP (mmHg) 70  BP Location Left Arm  BP Method Automatic  Patient Position (if appropriate) Lying  Pulse Rate 75  Pulse Rate Source Monitor  Resp 16  MEWS COLOR  MEWS Score Color Green  Oxygen Therapy  SpO2 95 %  O2 Device Room Air  Pain Assessment  Pain Scale 0-10  Pain Score 3  MEWS Score  MEWS Temp 0  MEWS Systolic 0  MEWS Pulse 0  MEWS RR 0  MEWS LOC 0  MEWS Score 0

## 2020-09-28 DIAGNOSIS — I2510 Atherosclerotic heart disease of native coronary artery without angina pectoris: Secondary | ICD-10-CM | POA: Diagnosis not present

## 2020-09-28 DIAGNOSIS — Z01810 Encounter for preprocedural cardiovascular examination: Secondary | ICD-10-CM

## 2020-09-28 LAB — LIPASE, BLOOD: Lipase: 54 U/L — ABNORMAL HIGH (ref 11–51)

## 2020-09-28 LAB — BASIC METABOLIC PANEL
Anion gap: 8 (ref 5–15)
BUN: 30 mg/dL — ABNORMAL HIGH (ref 8–23)
CO2: 23 mmol/L (ref 22–32)
Calcium: 7.9 mg/dL — ABNORMAL LOW (ref 8.9–10.3)
Chloride: 110 mmol/L (ref 98–111)
Creatinine, Ser: 1.45 mg/dL — ABNORMAL HIGH (ref 0.61–1.24)
GFR, Estimated: 42 mL/min — ABNORMAL LOW (ref >60)
Glucose, Bld: 117 mg/dL — ABNORMAL HIGH (ref 70–99)
Potassium: 3.8 mmol/L (ref 3.5–5.1)
Sodium: 141 mmol/L (ref 135–145)

## 2020-09-28 LAB — HEPATIC FUNCTION PANEL
ALT: 77 U/L — ABNORMAL HIGH (ref 0–44)
AST: 63 U/L — ABNORMAL HIGH (ref 15–41)
Albumin: 3 g/dL — ABNORMAL LOW (ref 3.5–5.0)
Alkaline Phosphatase: 64 U/L (ref 38–126)
Bilirubin, Direct: 0.1 mg/dL (ref 0.0–0.2)
Indirect Bilirubin: 0.3 mg/dL (ref 0.3–0.9)
Total Bilirubin: 0.4 mg/dL (ref 0.3–1.2)
Total Protein: 5.4 g/dL — ABNORMAL LOW (ref 6.5–8.1)

## 2020-09-28 LAB — CBC
HCT: 25.6 % — ABNORMAL LOW (ref 39.0–52.0)
Hemoglobin: 8.3 g/dL — ABNORMAL LOW (ref 13.0–17.0)
MCH: 36.2 pg — ABNORMAL HIGH (ref 26.0–34.0)
MCHC: 32.4 g/dL (ref 30.0–36.0)
MCV: 111.8 fL — ABNORMAL HIGH (ref 80.0–100.0)
Platelets: 139 10*3/uL — ABNORMAL LOW (ref 150–400)
RBC: 2.29 MIL/uL — ABNORMAL LOW (ref 4.22–5.81)
RDW: 13.2 % (ref 11.5–15.5)
WBC: 7.9 10*3/uL (ref 4.0–10.5)
nRBC: 0 % (ref 0.0–0.2)

## 2020-09-28 MED ORDER — MELATONIN 3 MG PO TABS
6.0000 mg | ORAL_TABLET | Freq: Every evening | ORAL | Status: DC | PRN
  Administered 2020-09-28: 6 mg via ORAL
  Filled 2020-09-28: qty 2

## 2020-09-28 NOTE — Progress Notes (Signed)
Patient declined flu vaccine at this time, reports he has discussed with his pcp and  reports he will get vaccination after discharge with his pcp and after current ailments have resolved.

## 2020-09-28 NOTE — Progress Notes (Signed)
Subjective: No chest or epigastric pain this morning. Tolerating liquids. Transaminases are normal. Afebrile and hemodynamically stable.   Objective: Vital signs in last 24 hours: Temp:  [98.2 F (36.8 C)-99.1 F (37.3 C)] 98.2 F (36.8 C) (10/11 0432) Pulse Rate:  [70-99] 77 (10/11 0432) Resp:  [14-21] 20 (10/11 0432) BP: (99-151)/(49-71) 130/54 (10/11 0432) SpO2:  [94 %-99 %] 96 % (10/11 0432) Weight:  [72.9 kg] 72.9 kg (10/10 1630) Last BM Date: 09/24/20  Intake/Output from previous day: 10/10 0701 - 10/11 0700 In: 2751.4 [P.O.:50; I.V.:614.4; IV Piggyback:2087] Out: 150 [Urine:150] Intake/Output this shift: Total I/O In: 763.5 [P.O.:480; I.V.:283.5] Out: 300 [Urine:300]  PE: General: resting comfortably, NAD Neuro: alert and oriented Resp: normal work of breathing Abdomen: soft, nondistended, nontender to palpation. Well-healed midline surgical scar. Extremities: warm and well-perfused  Lab Results:  Recent Labs    09/27/20 0503 09/28/20 0502  WBC 11.6* 7.9  HGB 8.5* 8.3*  HCT 25.5* 25.6*  PLT 152 139*   BMET Recent Labs    09/27/20 0503 09/28/20 0502  NA 142 141  K 3.9 3.8  CL 108 110  CO2 26 23  GLUCOSE 135* 117*  BUN 29* 30*  CREATININE 1.99* 1.45*  CALCIUM 8.2* 7.9*   PT/INR No results for input(s): LABPROT, INR in the last 72 hours. CMP     Component Value Date/Time   NA 141 09/28/2020 0502   NA 141 11/06/2017 0845   K 3.8 09/28/2020 0502   K 3.9 11/06/2017 0845   CL 110 09/28/2020 0502   CO2 23 09/28/2020 0502   CO2 28 11/06/2017 0845   GLUCOSE 117 (H) 09/28/2020 0502   GLUCOSE 103 11/06/2017 0845   BUN 30 (H) 09/28/2020 0502   BUN 24.8 11/06/2017 0845   CREATININE 1.45 (H) 09/28/2020 0502   CREATININE 1.40 (H) 08/05/2020 0829   CREATININE 1.6 (H) 11/06/2017 0845   CALCIUM 7.9 (L) 09/28/2020 0502   CALCIUM 8.4 11/06/2017 0845   PROT 5.4 (L) 09/28/2020 0502   PROT 6.3 (L) 11/06/2017 0845   ALBUMIN 3.0 (L) 09/28/2020  0502   ALBUMIN 3.4 (L) 11/06/2017 0845   AST 63 (H) 09/28/2020 0502   AST 19 08/05/2020 0829   AST 18 11/06/2017 0845   ALT 77 (H) 09/28/2020 0502   ALT 12 08/05/2020 0829   ALT 13 11/06/2017 0845   ALKPHOS 64 09/28/2020 0502   ALKPHOS 48 11/06/2017 0845   BILITOT 0.4 09/28/2020 0502   BILITOT 0.5 08/05/2020 0829   BILITOT 0.39 11/06/2017 0845   GFRNONAA 42 (L) 09/28/2020 0502   GFRNONAA 44 (L) 08/05/2020 0829   GFRAA 51 (L) 08/05/2020 0829   Lipase     Component Value Date/Time   LIPASE 54 (H) 09/28/2020 0502       Studies/Results: US Abdomen Limited  Result Date: 09/26/2020 CLINICAL DATA:  Follow-up gallstones. EXAM: ULTRASOUND ABDOMEN LIMITED RIGHT UPPER QUADRANT COMPARISON:  CT abdomen pelvis October 9th 2021 FINDINGS: Gallbladder: Gallstones are identified in the gallbladder. There is no pericholecystic fluid. No sonographic Percell Miller sign is reported by the ultrasound technologist. Common bile duct: Diameter: 9 mm.  This is upper limits of normal for patient's age. Liver: Mild inhomogeneous echotexture of liver is identified. Portal vein is patent on color Doppler imaging with normal direction of blood flow towards the liver. Other: None. IMPRESSION: Cholelithiasis without sonographic evidence of acute cholecystitis. Electronically Signed   By: Abelardo Diesel M.D.   On: 09/26/2020 14:08  ECHOCARDIOGRAM COMPLETE  Result Date: 09/27/2020    ECHOCARDIOGRAM REPORT   Patient Name:   GORGE ALMANZA Date of Exam: 09/27/2020 Medical Rec #:  [id number]  Height:       67.0 in Accession #:    [id number] Weight:       163.0 lb Date of Birth:  [birth year]   BSA:          1.854 m Patient Age:    86 years   BP:           107/71 mmHg Patient Gender: M          HR:           76 bpm. Exam Location:  Inpatient Procedure: 2D Echo Indications:    chest pain 786.50  History:        Patient has prior history of Echocardiogram examinations, most                 recent 01/13/2015. CAD; Risk Factors:Hypertension  and                 Dyslipidemia. NSTEMI.  Sonographer:    Jannett Celestine RDCS (AE) Referring Phys: [id number] JENNIFER CHOI IMPRESSIONS  1. Left ventricular ejection fraction, by estimation, is 60 to 65%. The left ventricle has normal function. The left ventricle has no regional wall motion abnormalities. Left ventricular diastolic parameters are indeterminate.  2. Right ventricular systolic function is normal. The right ventricular size is mildly enlarged. Tricuspid regurgitation signal is inadequate for assessing PA pressure.  3. Left atrial size was moderately dilated.  4. Right atrial size was mild to moderately dilated.  5. The mitral valve is grossly normal. Mild mitral valve regurgitation.  6. The aortic valve is grossly normal. Aortic valve regurgitation is not visualized. Comparison(s): Compared to 2016 study, LVEF appears improbed. RV size is stable, unable to estimate RVSP in this study. FINDINGS  Left Ventricle: Left ventricular ejection fraction, by estimation, is 60 to 65%. The left ventricle has normal function. The left ventricle has no regional wall motion abnormalities. The left ventricular internal cavity size was normal in size. There is  no left ventricular hypertrophy. Left ventricular diastolic parameters are indeterminate. Right Ventricle: The right ventricular size is mildly enlarged. No increase in right ventricular wall thickness. Right ventricular systolic function is normal. Tricuspid regurgitation signal is inadequate for assessing PA pressure. Left Atrium: Left atrial size was moderately dilated. Right Atrium: Right atrial size was mild to moderately dilated. Pericardium: There is no evidence of pericardial effusion. Mitral Valve: The mitral valve is grossly normal. Mild mitral valve regurgitation. Tricuspid Valve: The tricuspid valve is grossly normal. Tricuspid valve regurgitation is trivial. Aortic Valve: The aortic valve is grossly normal. Aortic valve regurgitation is not visualized.  Pulmonic Valve: The pulmonic valve was not well visualized. Pulmonic valve regurgitation is trivial. Aorta: The ascending aorta was not well visualized, the aortic arch was not well visualized and the aortic root was not well visualized. IAS/Shunts: The atrial septum is grossly normal.  LEFT VENTRICLE PLAX 2D LVIDd:         5.50 cm  Diastology LVIDs:         3.30 cm  LV e' medial:   7.40 cm/s LV PW:         1.10 cm  LV E/e' medial: 11.2 LV IVS:        0.90 cm LVOT diam:     1.70 cm LV SV:  45 LV SV Index:   24 LVOT Area:     2.27 cm  RIGHT VENTRICLE RV S prime:     18.10 cm/s TAPSE (M-mode): 2.0 cm LEFT ATRIUM             Index       RIGHT ATRIUM           Index LA diam:        4.00 cm 2.16 cm/m  RA Area:     14.80 cm LA Vol (A2C):   51.9 ml 28.00 ml/m RA Volume:   28.10 ml  15.16 ml/m LA Vol (A4C):   44.9 ml 24.22 ml/m LA Biplane Vol: 51.6 ml 27.83 ml/m  AORTIC VALVE LVOT Vmax:   85.00 cm/s LVOT Vmean:  60.000 cm/s LVOT VTI:    0.200 m  AORTA Ao Root diam: 3.10 cm MITRAL VALVE MV Area (PHT): 2.95 cm    SHUNTS MV Decel Time: 257 msec    Systemic VTI:  0.20 m MV E velocity: 83.10 cm/s  Systemic Diam: 1.70 cm Rudean Haskell MD Electronically signed by Rudean Haskell MD Signature Date/Time: 09/27/2020/1:28:19 PM    Final     Anti-infectives: Anti-infectives (From admission, onward)   Start     Dose/Rate Route Frequency Ordered Stop   09/26/20 1730  cefTRIAXone (ROCEPHIN) 2 g in sodium chloride 0.9 % 100 mL IVPB        2 g 200 mL/hr over 30 Minutes Intravenous Every 24 hours 09/26/20 1724     09/26/20 1730  metroNIDAZOLE (FLAGYL) IVPB 500 mg        500 mg 100 mL/hr over 60 Minutes Intravenous Every 8 hours 09/26/20 1724         Assessment/Plan 86 yo male who presented with epigastric pain and mild transaminitis, as well as a troponin elevation. Troponins are downtrending. I have reviewed his labs and imaging, including the recent CT scan and RUQ [place]. He does not have any  evidence of cholecystitis, however he does have gallstones and extrahepatic biliary ductal dilation, as well as mild intrahepatic biliary dilation. These findings in the setting of transient LFT elevation, and epigastric pain with mild lipase elevation, are consistent with a common bile duct stone that has passed. Cardiology has evaluated the patient and given him a 1.1% risk of perioperative MI or cardiac arrest, do not recommend further cardiac workup. I discussed with the patient that although he has some risk of periop cardiac issues due to his history of CAD, he also has a risk of developing recurrent choledocholithiasis with related complications (cholangitis, pancreatitis) if he does not have a cholecystectomy. He will think about and discuss with his family if he would like to proceed with surgery. Will make him NPO at midnight tonight for tentative laparoscopic cholecystectomy with intraop cholangiogram tomorrow.   LOS: 2 days    Michaelle Birks, MD [facility] Surgery General, Hepatobiliary and Pancreatic Surgery 09/28/20 8:51 AM

## 2020-09-28 NOTE — Progress Notes (Signed)
Subjective:  Denies SSCP, palpitations or Dyspnea Wants to eat this am " They are not going to do surgery"  Objective:  Vitals:   09/27/20 2316 09/27/20 2317 09/27/20 2358 09/28/20 0432  BP: (!) 99/49 (!) 113/50 (!) 115/55 (!) 130/54  Pulse: 74 72 94 77  Resp: 18 18 20 20   Temp:   98.5 F (36.9 C) 98.2 F (36.8 C)  TempSrc:   Oral Oral  SpO2: 94% 95% 98% 96%  Weight:      Height:        Intake/Output from previous day:  Intake/Output Summary (Last 24 hours) at 09/28/2020 0852 Last data filed at 09/28/2020 0844 Gross per 24 hour  Intake 2515.93 ml  Output 450 ml  Net 2065.93 ml    Physical Exam: Affect appropriate Healthy:  appears stated age HEENT: normal Neck supple with no adenopathy JVP normal no bruits no thyromegaly Lungs clear with no wheezing and good diaphragmatic motion Heart:  S1/S2 no murmur, no rub, gallop or click PMI normal Abdomen: Distended tympany no pain to palpation  Distal pulses intact with no bruits No edema Neuro non-focal Skin warm and dry No muscular weakness   Lab Results: Basic Metabolic Panel: Recent Labs    09/27/20 0503 09/28/20 0502  NA 142 141  K 3.9 3.8  CL 108 110  CO2 26 23  GLUCOSE 135* 117*  BUN 29* 30*  CREATININE 1.99* 1.45*  CALCIUM 8.2* 7.9*   Liver Function Tests: Recent Labs    09/27/20 0503 09/28/20 0502  AST 199* 63*  ALT 139* 77*  ALKPHOS 93 64  BILITOT 1.1 0.4  PROT 5.6* 5.4*  ALBUMIN 3.2* 3.0*   Recent Labs    09/26/20 0547 09/28/20 0502  LIPASE 140* 54*   CBC: Recent Labs    09/26/20 0547 09/26/20 1758 09/27/20 0503 09/28/20 0502  WBC 6.0   < > 11.6* 7.9  NEUTROABS 4.4  --  10.2*  --   HGB 10.1*   < > 8.5* 8.3*  HCT 30.6*   < > 25.5* 25.6*  MCV 110.5*   < > 110.9* 111.8*  PLT 209   < > 152 139*   < > = values in this interval not displayed.    Anemia Panel: No results for input(s): VITAMINB12, FOLATE, FERRITIN, TIBC, IRON, RETICCTPCT in the last 72  hours.  Imaging: US Abdomen Limited  Result Date: 09/26/2020 CLINICAL DATA:  Follow-up gallstones. EXAM: ULTRASOUND ABDOMEN LIMITED RIGHT UPPER QUADRANT COMPARISON:  CT abdomen pelvis October 9th 2021 FINDINGS: Gallbladder: Gallstones are identified in the gallbladder. There is no pericholecystic fluid. No sonographic Percell Miller sign is reported by the ultrasound technologist. Common bile duct: Diameter: 9 mm.  This is upper limits of normal for patient's age. Liver: Mild inhomogeneous echotexture of liver is identified. Portal vein is patent on color Doppler imaging with normal direction of blood flow towards the liver. Other: None. IMPRESSION: Cholelithiasis without sonographic evidence of acute cholecystitis. Electronically Signed   By: Abelardo Diesel M.D.   On: 09/26/2020 14:08   ECHOCARDIOGRAM COMPLETE  Result Date: 09/27/2020    ECHOCARDIOGRAM REPORT   Patient Name:   [name] [name] Date of Exam: 09/27/2020 Medical Rec #:  [id number]  Height:       67.0 in Accession #:    [id number] Weight:       163.0 lb Date of Birth:  1929-11-27   BSA:          1.854 m Patient Age:  86 years   BP:           107/71 mmHg Patient Gender: M          HR:           76 bpm. Exam Location:  Inpatient Procedure: 2D Echo Indications:    chest pain 786.50  History:        Patient has prior history of Echocardiogram examinations, most                 recent 01/13/2015. CAD; Risk Factors:Hypertension and                 Dyslipidemia. NSTEMI.  Sonographer:    Jannett Celestine RDCS (AE) Referring Phys: [id number] JENNIFER CHOI IMPRESSIONS  1. Left ventricular ejection fraction, by estimation, is 60 to 65%. The left ventricle has normal function. The left ventricle has no regional wall motion abnormalities. Left ventricular diastolic parameters are indeterminate.  2. Right ventricular systolic function is normal. The right ventricular size is mildly enlarged. Tricuspid regurgitation signal is inadequate for assessing PA pressure.  3. Left atrial  size was moderately dilated.  4. Right atrial size was mild to moderately dilated.  5. The mitral valve is grossly normal. Mild mitral valve regurgitation.  6. The aortic valve is grossly normal. Aortic valve regurgitation is not visualized. Comparison(s): Compared to 2016 study, LVEF appears improbed. RV size is stable, unable to estimate RVSP in this study. FINDINGS  Left Ventricle: Left ventricular ejection fraction, by estimation, is 60 to 65%. The left ventricle has normal function. The left ventricle has no regional wall motion abnormalities. The left ventricular internal cavity size was normal in size. There is  no left ventricular hypertrophy. Left ventricular diastolic parameters are indeterminate. Right Ventricle: The right ventricular size is mildly enlarged. No increase in right ventricular wall thickness. Right ventricular systolic function is normal. Tricuspid regurgitation signal is inadequate for assessing PA pressure. Left Atrium: Left atrial size was moderately dilated. Right Atrium: Right atrial size was mild to moderately dilated. Pericardium: There is no evidence of pericardial effusion. Mitral Valve: The mitral valve is grossly normal. Mild mitral valve regurgitation. Tricuspid Valve: The tricuspid valve is grossly normal. Tricuspid valve regurgitation is trivial. Aortic Valve: The aortic valve is grossly normal. Aortic valve regurgitation is not visualized. Pulmonic Valve: The pulmonic valve was not well visualized. Pulmonic valve regurgitation is trivial. Aorta: The ascending aorta was not well visualized, the aortic arch was not well visualized and the aortic root was not well visualized. IAS/Shunts: The atrial septum is grossly normal.  LEFT VENTRICLE PLAX 2D LVIDd:         5.50 cm  Diastology LVIDs:         3.30 cm  LV e' medial:   7.40 cm/s LV PW:         1.10 cm  LV E/e' medial: 11.2 LV IVS:        0.90 cm LVOT diam:     1.70 cm LV SV:         45 LV SV Index:   24 LVOT Area:     2.27 cm   RIGHT VENTRICLE RV S prime:     18.10 cm/s TAPSE (M-mode): 2.0 cm LEFT ATRIUM             Index       RIGHT ATRIUM           Index LA diam:        4.00  cm 2.16 cm/m  RA Area:     14.80 cm LA Vol (A2C):   51.9 ml 28.00 ml/m RA Volume:   28.10 ml  15.16 ml/m LA Vol (A4C):   44.9 ml 24.22 ml/m LA Biplane Vol: 51.6 ml 27.83 ml/m  AORTIC VALVE LVOT Vmax:   85.00 cm/s LVOT Vmean:  60.000 cm/s LVOT VTI:    0.200 m  AORTA Ao Root diam: 3.10 cm MITRAL VALVE MV Area (PHT): 2.95 cm    SHUNTS MV Decel Time: 257 msec    Systemic VTI:  0.20 m MV E velocity: 83.10 cm/s  Systemic Diam: 1.70 cm Rudean Haskell MD Electronically signed by Rudean Haskell MD Signature Date/Time: 09/27/2020/1:28:19 PM    Final     Cardiac Studies:  ECG: NSR no acute changes    Telemetry:  NSR 09/28/2020   Echo: EF 60-65% no RWMA;s 09/27/20  Mild MR   Medications:    amLODipine  5 mg Oral Daily   enoxaparin (LOVENOX) injection  30 mg Subcutaneous Q24H   influenza vaccine adjuvanted  0.5 mL Intramuscular Tomorrow-1000   metoprolol tartrate  25 mg Oral BID   tamsulosin  0.4 mg Oral Daily      sodium chloride 100 mL/hr at 09/28/20 0719   cefTRIAXone (ROCEPHIN)  IV Stopped (09/27/20 2100)   metronidazole 500 mg (09/28/20 0117)    Assessment/Plan:   1. CAD:  Cath 2014 with occluded and collateralized LAD. Minimum change in troponin with no chest pain, normal echo with no RWMAls and no acute ECG changes doubt cardiac event. Per Dr Oval Linsey yesterday ok to proceed with surgery for GB/Stones if symptoms worsen or LFTls done't continue to improve. Continue beta blocker resume ASA prior to d/c   2. HTN:  Well controlled.  Continue current medications and low sodium Dash type diet.    3. GB:  Continue rocephin advance diet per surgery and primary service LFT/s improved   Jenkins Rouge 09/28/2020, 8:52 AM

## 2020-09-28 NOTE — TOC Progression Note (Signed)
Transition of Care [facility]) - Progression Note    Patient Details  Name: [name] [name] MRN: [id number] Date of Birth: 12-19-1929  [facility] CM/SW Contact  Purcell Mouton, RN Phone Number: 09/28/2020, 1:55 PM  Clinical Narrative:     Pt from home with family. TOC will continue to follow for discharge needs.  Expected Discharge Plan: Home/Self Care Barriers to Discharge: No Barriers Identified  Expected Discharge Plan and Services Expected Discharge Plan: Home/Self Care       Living arrangements for the past 2 months: Single Family Home                                       Social Determinants of Health (SDOH) Interventions    Readmission Risk Interventions No flowsheet data found.

## 2020-09-28 NOTE — Progress Notes (Signed)
PROGRESS NOTE    [name] [name]  DXI:[id number] DOB: 1929/01/25 DOA: 09/26/2020 PCP: Orpah Melter, MD     Brief Narrative:  [name] [name] is a 86 y.o. 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 He is presenting with epigastric abdominal pain that started 4 days ago. It is constant, sharp and non-radiating. OTC medications did not help relieve the pain. He does not identify anything specifically that makes the pain worse. He had a strong flare this morning 8/10 and decided that it was time to come to the ED. He denies any other treatment. Imaging revealed cholelithiasis/questioned cholecystitis. He had mildly elevated troponin of 25.   New events last 24 hours / Subjective: States that he did not have any pain overnight, although nursing report that patient had complaints of pain around 11:30 PM.  Assessment & Plan:   Active Problems:   CAD in native artery   Cholecystitis   Epigastric abdominal pain/symptomatic cholecystitis -Right upper quadrant ultrasound showed cholelithiasis -General surgery following, concern for common bile duct stone that has passed.  Planning for possible laparoscopic cholecystectomy 10/12 -Empiric Rocephin, Flagyl  Elevated LFT -Improving  Elevated troponin -Troponin has remained flat 25 --> 25 --> 130 --> 102 -Echocardiogram without regional wall motion abnormality -Appreciate cardiology.  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 Malachy Mood -Gleevec is on hold due to empiric antibiotic therapy as above  Essential hypertension -Continue Norvasc, Lopressor  BPH -Continue Flomax    DVT prophylaxis:  enoxaparin (LOVENOX) injection 30 mg Start: 09/27/20 2000  Code Status:  Full code Family Communication: Wife at bedside Disposition Plan:  Status is: Inpatient  Remains inpatient appropriate because:Ongoing diagnostic testing needed not appropriate for outpatient work up and Inpatient level of care appropriate due to severity of illness   Dispo: The patient is from: Home              Anticipated d/c is to: Home              Anticipated d/c date is: 2 days              Patient currently is not medically stable to d/c.  Remains on IV antibiotics.  Tentative lap chole 10/12   Consultants:   General surgery  Cardiology  Procedures:   None  Antimicrobials:  Anti-infectives (From admission, onward)   Start     Dose/Rate Route Frequency Ordered Stop   09/26/20 1730  cefTRIAXone (ROCEPHIN) 2 g in sodium chloride 0.9 % 100 mL IVPB        2 g 200 mL/hr over 30 Minutes Intravenous Every 24 hours 09/26/20 1724     09/26/20 1730  metroNIDAZOLE (FLAGYL) IVPB 500 mg        500 mg 100 mL/hr over 60 Minutes Intravenous Every 8 hours 09/26/20 1724         Objective: Vitals:   09/27/20 2316 09/27/20 2317 09/27/20 2358 09/28/20 0432  BP: (!) 99/49 (!) 113/50 (!) 115/55 (!) 130/54  Pulse: 74 72 94 77  Resp: 18 18 20 20   Temp:   98.5 F (36.9 C) 98.2 F (36.8 C)  TempSrc:   Oral Oral  SpO2: 94% 95% 98% 96%  Weight:      Height:  Intake/Output Summary (Last 24 hours) at 09/28/2020 1045 Last data filed at 09/28/2020 0844 Gross per 24 hour  Intake 2515.93 ml  Output 450 ml  Net 2065.93 ml   Filed Weights   09/26/20 0533 09/27/20 1630  Weight: 73.9 kg 72.9 kg   Examination: General exam: Appears calm and comfortable  Respiratory system: Clear to auscultation. Respiratory effort normal. Cardiovascular system: S1 & S2 heard, RRR. No pedal edema. Gastrointestinal system: Abdomen is nondistended, soft and nontender. Normal bowel sounds heard. Central nervous system: Alert and oriented. Non focal exam. Speech clear  Extremities: Symmetric in  appearance bilaterally  Skin: No rashes, lesions or ulcers on exposed skin  Psychiatry: Judgement and insight appear stable. Mood & affect appropriate.    Data Reviewed: I have personally reviewed following labs and imaging studies  CBC: Recent Labs  Lab 09/26/20 0547 09/26/20 1758 09/27/20 0503 09/28/20 0502  WBC 6.0 7.9 11.6* 7.9  NEUTROABS 4.4  --  10.2*  --   HGB 10.1* 10.2* 8.5* 8.3*  HCT 30.6* 31.2* 25.5* 25.6*  MCV 110.5* 111.0* 110.9* 111.8*  PLT 209 185 152 932*   Basic Metabolic Panel: Recent Labs  Lab 09/26/20 0547 09/26/20 1758 09/27/20 0503 09/28/20 0502  NA 144  --  142 141  K 3.7  --  3.9 3.8  CL 105  --  108 110  CO2 29  --  26 23  GLUCOSE 110*  --  135* 117*  BUN 28*  --  29* 30*  CREATININE 1.49* 1.25* 1.99* 1.45*  CALCIUM 9.1  --  8.2* 7.9*   GFR: Estimated Creatinine Clearance: 32.3 mL/min (A) (by C-G formula based on SCr of 1.45 mg/dL (H)). Liver Function Tests: Recent Labs  Lab 09/26/20 0547 09/27/20 0503 09/28/20 0502  AST 23 199* 63*  ALT 16 139* 77*  ALKPHOS 45 93 64  BILITOT 0.7 1.1 0.4  PROT 6.5 5.6* 5.4*  ALBUMIN 3.9 3.2* 3.0*   Recent Labs  Lab 09/26/20 0547 09/28/20 0502  LIPASE 140* 54*   No results for input(s): AMMONIA in the last 168 hours. Coagulation Profile: No results for input(s): INR, PROTIME in the last 168 hours. Cardiac Enzymes: No results for input(s): CKTOTAL, CKMB, CKMBINDEX, TROPONINI in the last 168 hours. BNP (last 3 results) No results for input(s): PROBNP in the last 8760 hours. HbA1C: No results for input(s): HGBA1C in the last 72 hours. CBG: No results for input(s): GLUCAP in the last 168 hours. Lipid Profile: No results for input(s): CHOL, HDL, LDLCALC, TRIG, CHOLHDL, LDLDIRECT in the last 72 hours. Thyroid Function Tests: No results for input(s): TSH, T4TOTAL, FREET4, T3FREE, THYROIDAB in the last 72 hours. Anemia Panel: No results for input(s): VITAMINB12, FOLATE, FERRITIN, TIBC, IRON,  RETICCTPCT in the last 72 hours. Sepsis Labs: Recent Labs  Lab 09/26/20 0547  LATICACIDVEN 1.1    Recent Results (from the past 240 hour(s))  Respiratory Panel by RT PCR (Flu A&B, Covid) - Nasopharyngeal Swab     Status: None   Collection Time: 09/26/20  3:04 PM   Specimen: Nasopharyngeal Swab  Result Value Ref Range Status   SARS Coronavirus 2 by RT PCR NEGATIVE NEGATIVE Final    Comment: (NOTE) SARS-CoV-2 target nucleic acids are NOT DETECTED.  The SARS-CoV-2 RNA is generally detectable in upper respiratoy specimens during the acute phase of infection. The lowest concentration of SARS-CoV-2 viral copies this assay can detect is 131 copies/mL. A negative result does not preclude SARS-Cov-2 infection and should not be  used as the sole basis for treatment or other patient management decisions. A negative result may occur with  improper specimen collection/handling, submission of specimen other than nasopharyngeal swab, presence of viral mutation(s) within the areas targeted by this assay, and inadequate number of viral copies (<131 copies/mL). A negative result must be combined with clinical observations, patient history, and epidemiological information. The expected result is Negative.  Fact Sheet for Patients:  PinkCheek.be  Fact Sheet for Healthcare Providers:  GravelBags.it  This test is no t yet approved or cleared by the [place] FDA and  has been authorized for detection and/or diagnosis of SARS-CoV-2 by FDA under an Emergency Use Authorization (EUA). This EUA will remain  in effect (meaning this test can be used) for the duration of the COVID-19 declaration under Section 564(b)(1) of the Act, 21 U.S.C. section 360bbb-3(b)(1), unless the authorization is terminated or revoked sooner.     Influenza A by PCR NEGATIVE NEGATIVE Final   Influenza B by PCR NEGATIVE NEGATIVE Final    Comment: (NOTE) The Xpert  Xpress SARS-CoV-2/FLU/RSV assay is intended as an aid in  the diagnosis of influenza from Nasopharyngeal swab specimens and  should not be used as a sole basis for treatment. Nasal washings and  aspirates are unacceptable for Xpert Xpress SARS-CoV-2/FLU/RSV  testing.  Fact Sheet for Patients: PinkCheek.be  Fact Sheet for Healthcare Providers: GravelBags.it  This test is not yet approved or cleared by the [place] FDA and  has been authorized for detection and/or diagnosis of SARS-CoV-2 by  FDA under an Emergency Use Authorization (EUA). This EUA will remain  in effect (meaning this test can be used) for the duration of the  Covid-19 declaration under Section 564(b)(1) of the Act, 21  U.S.C. section 360bbb-3(b)(1), unless the authorization is  terminated or revoked. Performed at [facility], [place] [address], [place], [place] [address]       Radiology Studies: US Abdomen Limited  Result Date: 09/26/2020 CLINICAL DATA:  Follow-up gallstones. EXAM: ULTRASOUND ABDOMEN LIMITED RIGHT UPPER QUADRANT COMPARISON:  CT abdomen pelvis October 9th 2021 FINDINGS: Gallbladder: Gallstones are identified in the gallbladder. There is no pericholecystic fluid. No sonographic Percell Miller sign is reported by the ultrasound technologist. Common bile duct: Diameter: 9 mm.  This is upper limits of normal for patient's age. Liver: Mild inhomogeneous echotexture of liver is identified. Portal vein is patent on color Doppler imaging with normal direction of blood flow towards the liver. Other: None. IMPRESSION: Cholelithiasis without sonographic evidence of acute cholecystitis. Electronically Signed   By: Abelardo Diesel M.D.   On: 09/26/2020 14:08   ECHOCARDIOGRAM COMPLETE  Result Date: 09/27/2020    ECHOCARDIOGRAM REPORT   Patient Name:   ALONZA KNISLEY Date of Exam: 09/27/2020 Medical Rec #:  [id number]  Height:       67.0 in Accession #:     [id number] Weight:       163.0 lb Date of Birth:  November 12, 1929   BSA:          1.854 m Patient Age:    83 years   BP:           107/71 mmHg Patient Gender: M          HR:           76 bpm. Exam Location:  Inpatient Procedure: 2D Echo Indications:    chest pain 786.50  History:        Patient has prior history of Echocardiogram examinations,  most                 recent 01/13/2015. CAD; Risk Factors:Hypertension and                 Dyslipidemia. NSTEMI.  Sonographer:    Jannett Celestine RDCS (AE) Referring Phys: [id number] Yesenia Fontenette IMPRESSIONS  1. Left ventricular ejection fraction, by estimation, is 60 to 65%. The left ventricle has normal function. The left ventricle has no regional wall motion abnormalities. Left ventricular diastolic parameters are indeterminate.  2. Right ventricular systolic function is normal. The right ventricular size is mildly enlarged. Tricuspid regurgitation signal is inadequate for assessing PA pressure.  3. Left atrial size was moderately dilated.  4. Right atrial size was mild to moderately dilated.  5. The mitral valve is grossly normal. Mild mitral valve regurgitation.  6. The aortic valve is grossly normal. Aortic valve regurgitation is not visualized. Comparison(s): Compared to 2016 study, LVEF appears improbed. RV size is stable, unable to estimate RVSP in this study. FINDINGS  Left Ventricle: Left ventricular ejection fraction, by estimation, is 60 to 65%. The left ventricle has normal function. The left ventricle has no regional wall motion abnormalities. The left ventricular internal cavity size was normal in size. There is  no left ventricular hypertrophy. Left ventricular diastolic parameters are indeterminate. Right Ventricle: The right ventricular size is mildly enlarged. No increase in right ventricular wall thickness. Right ventricular systolic function is normal. Tricuspid regurgitation signal is inadequate for assessing PA pressure. Left Atrium: Left atrial size was  moderately dilated. Right Atrium: Right atrial size was mild to moderately dilated. Pericardium: There is no evidence of pericardial effusion. Mitral Valve: The mitral valve is grossly normal. Mild mitral valve regurgitation. Tricuspid Valve: The tricuspid valve is grossly normal. Tricuspid valve regurgitation is trivial. Aortic Valve: The aortic valve is grossly normal. Aortic valve regurgitation is not visualized. Pulmonic Valve: The pulmonic valve was not well visualized. Pulmonic valve regurgitation is trivial. Aorta: The ascending aorta was not well visualized, the aortic arch was not well visualized and the aortic root was not well visualized. IAS/Shunts: The atrial septum is grossly normal.  LEFT VENTRICLE PLAX 2D LVIDd:         5.50 cm  Diastology LVIDs:         3.30 cm  LV e' medial:   7.40 cm/s LV PW:         1.10 cm  LV E/e' medial: 11.2 LV IVS:        0.90 cm LVOT diam:     1.70 cm LV SV:         45 LV SV Index:   24 LVOT Area:     2.27 cm  RIGHT VENTRICLE RV S prime:     18.10 cm/s TAPSE (M-mode): 2.0 cm LEFT ATRIUM             Index       RIGHT ATRIUM           Index LA diam:        4.00 cm 2.16 cm/m  RA Area:     14.80 cm LA Vol (A2C):   51.9 ml 28.00 ml/m RA Volume:   28.10 ml  15.16 ml/m LA Vol (A4C):   44.9 ml 24.22 ml/m LA Biplane Vol: 51.6 ml 27.83 ml/m  AORTIC VALVE LVOT Vmax:   85.00 cm/s LVOT Vmean:  60.000 cm/s LVOT VTI:    0.200 m  AORTA Ao Root diam: 3.10  cm MITRAL VALVE MV Area (PHT): 2.95 cm    SHUNTS MV Decel Time: 257 msec    Systemic VTI:  0.20 m MV E velocity: 83.10 cm/s  Systemic Diam: 1.70 cm Rudean Haskell MD Electronically signed by Rudean Haskell MD Signature Date/Time: 09/27/2020/1:28:19 PM    Final       Scheduled Meds: . amLODipine  5 mg Oral Daily  . enoxaparin (LOVENOX) injection  30 mg Subcutaneous Q24H  . influenza vaccine adjuvanted  0.5 mL Intramuscular Tomorrow-1000  . metoprolol tartrate  25 mg Oral BID  . tamsulosin  0.4 mg Oral Daily    Continuous Infusions: . sodium chloride 75 mL/hr at 09/28/20 0945  . cefTRIAXone (ROCEPHIN)  IV Stopped (09/27/20 2100)  . metronidazole 500 mg (09/28/20 2574)     LOS: 2 days      Time spent: 25 minutes   Dessa Phi, DO [facility] 09/28/2020, 10:45 AM   Available via Epic secure chat 7am-7pm After these hours, please refer to coverage provider listed on [email or web address]

## 2020-09-29 LAB — HEPATIC FUNCTION PANEL
ALT: 53 U/L — ABNORMAL HIGH (ref 0–44)
AST: 39 U/L (ref 15–41)
Albumin: 3.1 g/dL — ABNORMAL LOW (ref 3.5–5.0)
Alkaline Phosphatase: 59 U/L (ref 38–126)
Bilirubin, Direct: 0.1 mg/dL (ref 0.0–0.2)
Indirect Bilirubin: 0.4 mg/dL (ref 0.3–0.9)
Total Bilirubin: 0.5 mg/dL (ref 0.3–1.2)
Total Protein: 5.5 g/dL — ABNORMAL LOW (ref 6.5–8.1)

## 2020-09-29 LAB — CBC
HCT: 26.9 % — ABNORMAL LOW (ref 39.0–52.0)
Hemoglobin: 8.8 g/dL — ABNORMAL LOW (ref 13.0–17.0)
MCH: 36.4 pg — ABNORMAL HIGH (ref 26.0–34.0)
MCHC: 32.7 g/dL (ref 30.0–36.0)
MCV: 111.2 fL — ABNORMAL HIGH (ref 80.0–100.0)
Platelets: 144 10*3/uL — ABNORMAL LOW (ref 150–400)
RBC: 2.42 MIL/uL — ABNORMAL LOW (ref 4.22–5.81)
RDW: 13.2 % (ref 11.5–15.5)
WBC: 5.8 10*3/uL (ref 4.0–10.5)
nRBC: 0 % (ref 0.0–0.2)

## 2020-09-29 LAB — BASIC METABOLIC PANEL
Anion gap: 8 (ref 5–15)
BUN: 23 mg/dL (ref 8–23)
CO2: 22 mmol/L (ref 22–32)
Calcium: 8.1 mg/dL — ABNORMAL LOW (ref 8.9–10.3)
Chloride: 111 mmol/L (ref 98–111)
Creatinine, Ser: 1.23 mg/dL (ref 0.61–1.24)
GFR, Estimated: 52 mL/min — ABNORMAL LOW (ref >60)
Glucose, Bld: 110 mg/dL — ABNORMAL HIGH (ref 70–99)
Potassium: 3.7 mmol/L (ref 3.5–5.1)
Sodium: 141 mmol/L (ref 135–145)

## 2020-09-29 NOTE — Discharge Instructions (Signed)
You have an appointment with Dr. Zenia Resides at [facility] on Tuesday, October 26 at 10:00am. Please arrive at 9:30am for your appointment. This will be at the [facility] Surgery office at [address], [place], [place], [place].

## 2020-09-29 NOTE — Discharge Summary (Signed)
Physician Discharge Summary  [name] [name] PJA:[id number] DOB: 04/03/1929 DOA: 09/26/2020  PCP: [name] Melter, MD  Admit date: 09/26/2020 Discharge date: 09/29/2020  Admitted From: Home Disposition:  Home  Recommendations for Outpatient Follow-up:  1. Follow up with PCP in 1 week 2. Follow up with [name] [name], general surgery, in 1-2 weeks with repeat LFTs and potential MRCP  Discharge Condition: Stable CODE STATUS: Full  Diet recommendation:  Diet Orders (From admission, onward)    Start     Ordered   09/29/20 0705  Diet Heart Room service appropriate? Yes; Fluid consistency: Thin  Diet effective now       Question Answer Comment  Room service appropriate? 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 He is presenting with epigastric abdominal pain that started 4 days ago. It is constant, sharp and non-radiating. OTC medications did not help relieve the pain. He does not identify anything specifically that makes the pain worse. He had a strong flare this morning 8/10 and decided that it was time to come to the ED. He denies any other treatment.Imaging revealed cholelithiasis/questioned cholecystitis. He had mildly elevated troponin of 25 which peaked at 130. Cardiology as well as general surgery were consulted. General surgery offered cholecystectomy vs nonoperative management. They discussed risk of recurrent choledocholithiasis with pancreatitis or cholangitis. Family elected against surgery and wanted to be discharged home. They will follow up closely with general surgery in 1-2 weeks with further discussion about MRCP.   Discharge Diagnoses:  Active Problems:   CAD in native artery   Cholecystitis   Epigastric abdominal pain/symptomatic cholecystitis -Right upper quadrant ultrasound showed cholelithiasis -General surgery offered cholecystectomy vs nonoperative  management. They discussed risk of recurrent choledocholithiasis with pancreatitis or cholangitis. Family elected against surgery and wanted to be discharged home. They will follow up closely with general surgery in 1-2 weeks with further discussion about MRCP.   Elevated LFT -Improved  -Hold zocor until LFT normalized   Elevated troponin -Troponin has remained flat 25 --> 25 --> 130 --> 102 -Echocardiogram without regional wall motion abnormality -Appreciate cardiology.  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 Malachy [name] -On Gleevec   Essential hypertension -Continue Norvasc, Lopressor  BPH -Continue Flomax   Discharge Instructions  Discharge Instructions    Call MD for:  difficulty breathing, headache or visual disturbances   Complete by: As directed    Call MD for:  extreme fatigue   Complete by: As directed    Call MD for:  persistant dizziness or light-headedness   Complete by: As directed    Call MD for:  persistant nausea and vomiting   Complete by: As directed    Call MD for:  severe uncontrolled pain   Complete by: As directed    Call MD for:  temperature >100.4   Complete by: As directed    Discharge instructions   Complete by: As directed    You were cared for by a hospitalist during your hospital stay. If you have any questions about your discharge medications or the care you received while you were in the hospital after you  are discharged, you can call the unit and ask to speak with the hospitalist on call if the hospitalist that took care of you is not available. Once you are discharged, your primary care physician will handle any further medical issues. Please note that NO REFILLS for any  discharge medications will be authorized once you are discharged, as it is imperative that you return to your primary care physician (or establish a relationship with a primary care physician if you do not have one) for your aftercare needs so that they can reassess your need for medications and monitor your lab values.   Increase activity slowly   Complete by: As directed      Allergies as of 09/29/2020   No Known Allergies     Medication List    STOP taking these medications   simvastatin 20 MG tablet Commonly known as: ZOCOR     TAKE these medications   amLODipine 5 MG tablet Commonly known as: NORVASC Take 5 mg by mouth daily.   furosemide 20 MG tablet Commonly known as: Lasix Take 1 tablet (20 mg total) by mouth daily.   Gleevec 400 MG tablet Generic drug: imatinib TAKE 1 TABLET (400 MG TOTAL) BY MOUTH DAILY. TAKE WITH MEALS AND LARGE GLASS OF WATER.   metoprolol tartrate 25 MG tablet Commonly known as: LOPRESSOR Take 1 tablet (25 mg total) by mouth 2 (two) times daily.   tamsulosin 0.4 MG Caps capsule Commonly known as: FLOMAX Take 0.4 mg by mouth daily.       Follow-up Information    [name] Melter, MD. Schedule an appointment as soon as possible for a visit in 1 week(s).   Specialty: Family Medicine Contact information: [address] [place] [address] [phone]        [name] Bolt, MD. Schedule an appointment as soon as possible for a visit in 1 week(s).   Specialty: General Surgery Contact information: [place] [place] [address] [phone]              No Known Allergies  Consultations:  Cardiology  General surgery    Procedures/Studies: CT ABDOMEN PELVIS W CONTRAST  Result Date: 09/26/2020 CLINICAL DATA:  Abdominal pain and diarrhea EXAM: CT ABDOMEN AND PELVIS WITH CONTRAST TECHNIQUE: Multidetector CT imaging of the abdomen and pelvis was performed using the standard protocol following  bolus administration of intravenous contrast. CONTRAST:  44mL OMNIPAQUE IOHEXOL 300 MG/ML  SOLN COMPARISON:  November 19, 2018 FINDINGS: Lower chest: Minimal atelectasis of posterior lung bases are noted. Hepatobiliary: Gallstone is noted in the gallbladder. No inflammation is identified around the gallbladder. No focal liver lesion is identified. There is prominence of common hepatic duct measuring 1 cm unchanged compared to prior exam. Pancreas: Unremarkable. No pancreatic ductal dilatation or surrounding inflammatory changes. Spleen: Normal in size without focal abnormality. Adrenals/Urinary Tract: The adrenal glands are normal. Small cysts identified in the right kidney. The left kidney is normal. There is no hydronephrosis bilaterally. The bladder is normal. Stomach/Bowel: There is right inguinal herniation of mesenteric fat and small bowel loops without evidence of bowel obstruction or incarceration. The appearance is unchanged compared prior CT. Patient status post prior surgery of bowel loops in the right lower abdomen. There is no evidence of diverticulitis. The appendix is not seen but no inflammation is noted around cecum. Vascular/Lymphatic: Aortic atherosclerosis. No enlarged abdominal or pelvic lymph nodes. Reproductive: Prostate is unremarkable. Other: None. Musculoskeletal: Degenerative joint changes of  the spine are identified. IMPRESSION: 1. 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 The appearance is unchanged compared to prior CT. 2. Cholelithiasis. 3. Aortic atherosclerosis. Aortic Atherosclerosis (ICD10-I70.0). Electronically Signed   By: Abelardo Diesel M.D.   On: 09/26/2020 08:53   US Abdomen Limited  Result Date: 09/26/2020 CLINICAL DATA:  Follow-up gallstones. EXAM: ULTRASOUND ABDOMEN LIMITED RIGHT UPPER QUADRANT COMPARISON:  CT abdomen pelvis October 9th 2021 FINDINGS: Gallbladder: Gallstones are identified in  the gallbladder. There is no pericholecystic fluid. No sonographic Percell Miller sign is reported by the ultrasound technologist. Common bile duct: Diameter: 9 mm.  This is upper limits of normal for patient's age. Liver: Mild inhomogeneous echotexture of liver is identified. Portal vein is patent on color Doppler imaging with normal direction of blood flow towards the liver. Other: None. IMPRESSION: Cholelithiasis without sonographic evidence of acute cholecystitis. Electronically Signed   By: Abelardo Diesel M.D.   On: 09/26/2020 14:08   ECHOCARDIOGRAM COMPLETE  Result Date: 09/27/2020    ECHOCARDIOGRAM REPORT   Patient Name:   PERETZ THIEME Date of Exam: 09/27/2020 Medical Rec #:  [id number]  Height:       67.0 in Accession #:    [id number] Weight:       163.0 lb Date of Birth:  Sep 14, 1929   BSA:          1.854 m Patient Age:    86 years   BP:           107/71 mmHg Patient Gender: M          HR:           76 bpm. Exam Location:  Inpatient Procedure: 2D Echo Indications:    chest pain 786.50  History:        Patient has prior history of Echocardiogram examinations, most                 recent 01/13/2015. CAD; Risk Factors:Hypertension and                 Dyslipidemia. NSTEMI.  Sonographer:    Jannett Celestine RDCS (AE) Referring Phys: [id number] Jalayah Gutridge IMPRESSIONS  1. Left ventricular ejection fraction, by estimation, is 60 to 65%. The left ventricle has normal function. The left ventricle has no regional wall motion abnormalities. Left ventricular diastolic parameters are indeterminate.  2. Right ventricular systolic function is normal. The right ventricular size is mildly enlarged. Tricuspid regurgitation signal is inadequate for assessing PA pressure.  3. Left atrial size was moderately dilated.  4. Right atrial size was mild to moderately dilated.  5. The mitral valve is grossly normal. Mild mitral valve regurgitation.  6. The aortic valve is grossly normal. Aortic valve regurgitation is not visualized. Comparison(s):  Compared to 2016 study, LVEF appears improbed. RV size is stable, unable to estimate RVSP in this study. FINDINGS  Left Ventricle: Left ventricular ejection fraction, by estimation, is 60 to 65%. The left ventricle has normal function. The left ventricle has no regional wall motion abnormalities. The left ventricular internal cavity size was normal in size. There is  no left ventricular hypertrophy. Left ventricular diastolic parameters are indeterminate. Right Ventricle: The right ventricular size is mildly enlarged. No increase in right ventricular wall thickness. Right ventricular systolic function is normal. Tricuspid regurgitation signal is inadequate for assessing PA pressure. Left Atrium: Left atrial size was moderately dilated. Right Atrium: Right atrial size was mild to moderately dilated.  Pericardium: There is no evidence of pericardial effusion. Mitral Valve: The mitral valve is grossly normal. Mild mitral valve regurgitation. Tricuspid Valve: The tricuspid valve is grossly normal. Tricuspid valve regurgitation is trivial. Aortic Valve: The aortic valve is grossly normal. Aortic valve regurgitation is not visualized. Pulmonic Valve: The pulmonic valve was not well visualized. Pulmonic valve regurgitation is trivial. Aorta: The ascending aorta was not well visualized, the aortic arch was not well visualized and the aortic root was not well visualized. IAS/Shunts: The atrial septum is grossly normal.  LEFT VENTRICLE PLAX 2D LVIDd:         5.50 cm  Diastology LVIDs:         3.30 cm  LV e' medial:   7.40 cm/s LV PW:         1.10 cm  LV E/e' medial: 11.2 LV IVS:        0.90 cm LVOT diam:     1.70 cm LV SV:         45 LV SV Index:   24 LVOT Area:     2.27 cm  RIGHT VENTRICLE RV S prime:     18.10 cm/s TAPSE (M-mode): 2.0 cm LEFT ATRIUM             Index       RIGHT ATRIUM           Index LA diam:        4.00 cm 2.16 cm/m  RA Area:     14.80 cm LA Vol (A2C):   51.9 ml 28.00 ml/m RA Volume:   28.10 ml  15.16  ml/m LA Vol (A4C):   44.9 ml 24.22 ml/m LA Biplane Vol: 51.6 ml 27.83 ml/m  AORTIC VALVE LVOT Vmax:   85.00 cm/s LVOT Vmean:  60.000 cm/s LVOT VTI:    0.200 m  AORTA Ao Root diam: 3.10 cm MITRAL VALVE MV Area (PHT): 2.95 cm    SHUNTS MV Decel Time: 257 msec    Systemic VTI:  0.20 m MV E velocity: 83.10 cm/s  Systemic Diam: 1.70 cm Rudean Haskell MD Electronically signed by Rudean Haskell MD Signature Date/Time: 09/27/2020/1:28:19 PM    Final       Discharge Exam: Vitals:   09/28/20 2129 09/29/20 0456  BP: (!) 137/59 (!) 148/75  Pulse: 81 85  Resp: 18 20  Temp: 98.8 F (37.1 C) 98 F (36.7 C)  SpO2: 95% 91%     General: Pt is alert, awake, not in acute distress Cardiovascular: RRR, S1/S2 +, no edema Respiratory: CTA bilaterally, no wheezing, no rhonchi, no respiratory distress, no conversational dyspnea  Abdominal: Soft, NT, ND, bowel sounds + Extremities: no edema, no cyanosis Psych: Normal [name] and affect, stable judgement and insight     The results of significant diagnostics from this hospitalization (including imaging, microbiology, ancillary and laboratory) are listed below for reference.     Microbiology: Recent Results (from the past 240 hour(s))  Respiratory Panel by RT PCR (Flu A&B, Covid) - Nasopharyngeal Swab     Status: None   Collection Time: 09/26/20  3:04 PM   Specimen: Nasopharyngeal Swab  Result Value Ref Range Status   SARS Coronavirus 2 by RT PCR NEGATIVE NEGATIVE Final    Comment: (NOTE) SARS-CoV-2 target nucleic acids are NOT DETECTED.  The SARS-CoV-2 RNA is generally detectable in upper respiratoy specimens during the acute phase of infection. The lowest concentration of SARS-CoV-2 viral copies this assay can detect is 131 copies/mL. A negative  result does not preclude SARS-Cov-2 infection and should not be used as the sole basis for treatment or other patient management decisions. A negative result may occur with  improper specimen  collection/handling, submission of specimen other than nasopharyngeal swab, presence of viral mutation(s) within the areas targeted by this assay, and inadequate number of viral copies (<131 copies/mL). A negative result must be combined with clinical observations, patient history, and epidemiological information. The expected result is Negative.  Fact Sheet for Patients:  PinkCheek.be  Fact Sheet for Healthcare Providers:  GravelBags.it  This test is no t yet approved or cleared by the [place] FDA and  has been authorized for detection and/or diagnosis of SARS-CoV-2 by FDA under an Emergency Use Authorization (EUA). This EUA will remain  in effect (meaning this test can be used) for the duration of the COVID-19 declaration under Section 564(b)(1) of the Act, 21 U.S.C. section 360bbb-3(b)(1), unless the authorization is terminated or revoked sooner.     Influenza A by PCR NEGATIVE NEGATIVE Final   Influenza B by PCR NEGATIVE NEGATIVE Final    Comment: (NOTE) The Xpert Xpress SARS-CoV-2/FLU/RSV assay is intended as an aid in  the diagnosis of influenza from Nasopharyngeal swab specimens and  should not be used as a sole basis for treatment. Nasal washings and  aspirates are unacceptable for Xpert Xpress SARS-CoV-2/FLU/RSV  testing.  Fact Sheet for Patients: PinkCheek.be  Fact Sheet for Healthcare Providers: GravelBags.it  This test is not yet approved or cleared by the [place] FDA and  has been authorized for detection and/or diagnosis of SARS-CoV-2 by  FDA under an Emergency Use Authorization (EUA). This EUA will remain  in effect (meaning this test can be used) for the duration of the  Covid-19 declaration under Section 564(b)(1) of the Act, 21  U.S.C. section 360bbb-3(b)(1), unless the authorization is  terminated or revoked. Performed at [facility], [place] [address] [place], [place] [address]      Labs: BNP (last 3 results) No results for input(s): BNP in the last 8760 hours. Basic Metabolic Panel: Recent Labs  Lab 09/26/20 0547 09/26/20 1758 09/27/20 0503 09/28/20 0502 09/29/20 0513  NA 144  --  142 141 141  K 3.7  --  3.9 3.8 3.7  CL 105  --  108 110 111  CO2 29  --  26 23 22   GLUCOSE 110*  --  135* 117* 110*  BUN 28*  --  29* 30* 23  CREATININE 1.49* 1.25* 1.99* 1.45* 1.23  CALCIUM 9.1  --  8.2* 7.9* 8.1*   Liver Function Tests: Recent Labs  Lab 09/26/20 0547 09/27/20 0503 09/28/20 0502 09/29/20 0513  AST 23 199* 63* 39  ALT 16 139* 77* 53*  ALKPHOS 45 93 64 59  BILITOT 0.7 1.1 0.4 0.5  PROT 6.5 5.6* 5.4* 5.5*  ALBUMIN 3.9 3.2* 3.0* 3.1*   Recent Labs  Lab 09/26/20 0547 09/28/20 0502  LIPASE 140* 54*   No results for input(s): AMMONIA in the last 168 hours. CBC: Recent Labs  Lab 09/26/20 0547 09/26/20 1758 09/27/20 0503 09/28/20 0502 09/29/20 0513  WBC 6.0 7.9 11.6* 7.9 5.8  NEUTROABS 4.4  --  10.2*  --   --   HGB 10.1* 10.2* 8.5* 8.3* 8.8*  HCT 30.6* 31.2* 25.5* 25.6* 26.9*  MCV 110.5* 111.0* 110.9* 111.8* 111.2*  PLT 209 185 152 139* 144*   Cardiac Enzymes: No results for input(s): CKTOTAL, CKMB, CKMBINDEX, TROPONINI in the last  168 hours. BNP: Invalid input(s): POCBNP CBG: No results for input(s): GLUCAP in the last 168 hours. D-Dimer No results for input(s): DDIMER in the last 72 hours. Hgb A1c No results for input(s): HGBA1C in the last 72 hours. Lipid Profile No results for input(s): CHOL, HDL, LDLCALC, TRIG, CHOLHDL, LDLDIRECT in the last 72 hours. Thyroid function studies No results for input(s): TSH, T4TOTAL, T3FREE, THYROIDAB in the last 72 hours.  Invalid input(s): FREET3 Anemia work up No results for input(s): VITAMINB12, FOLATE, FERRITIN, TIBC, IRON, RETICCTPCT in the last 72 hours. Urinalysis    Component Value Date/Time   COLORURINE  YELLOW 02/07/2020 [place] 02/07/2020 0854   LABSPEC 1.019 02/07/2020 0854   PHURINE 5.0 02/07/2020 0854   GLUCOSEU NEGATIVE 02/07/2020 0854   HGBUR NEGATIVE 02/07/2020 0854   BILIRUBINUR NEGATIVE 02/07/2020 0854   KETONESUR 5 (A) 02/07/2020 0854   PROTEINUR 30 (A) 02/07/2020 0854   UROBILINOGEN 0.2 05/06/2015 0140   NITRITE NEGATIVE 02/07/2020 0854   LEUKOCYTESUR NEGATIVE 02/07/2020 0854   Sepsis Labs Invalid input(s): PROCALCITONIN,  WBC,  LACTICIDVEN Microbiology Recent Results (from the past 240 hour(s))  Respiratory Panel by RT PCR (Flu A&B, Covid) - Nasopharyngeal Swab     Status: None   Collection Time: 09/26/20  3:04 PM   Specimen: Nasopharyngeal Swab  Result Value Ref Range Status   SARS Coronavirus 2 by RT PCR NEGATIVE NEGATIVE Final    Comment: (NOTE) SARS-CoV-2 target nucleic acids are NOT DETECTED.  The SARS-CoV-2 RNA is generally detectable in upper respiratoy specimens during the acute phase of infection. The lowest concentration of SARS-CoV-2 viral copies this assay can detect is 131 copies/mL. A negative result does not preclude SARS-Cov-2 infection and should not be used as the sole basis for treatment or other patient management decisions. A negative result may occur with  improper specimen collection/handling, submission of specimen other than nasopharyngeal swab, presence of viral mutation(s) within the areas targeted by this assay, and inadequate number of viral copies (<131 copies/mL). A negative result must be combined with clinical observations, patient history, and epidemiological information. The expected result is Negative.  Fact Sheet for Patients:  PinkCheek.be  Fact Sheet for Healthcare Providers:  GravelBags.it  This test is no t yet approved or cleared by the [place] FDA and  has been authorized for detection and/or diagnosis of SARS-CoV-2 by FDA under an  Emergency Use Authorization (EUA). This EUA will remain  in effect (meaning this test can be used) for the duration of the COVID-19 declaration under Section 564(b)(1) of the Act, 21 U.S.C. section 360bbb-3(b)(1), unless the authorization is terminated or revoked sooner.     Influenza A by PCR NEGATIVE NEGATIVE Final   Influenza B by PCR NEGATIVE NEGATIVE Final    Comment: (NOTE) The Xpert Xpress SARS-CoV-2/FLU/RSV assay is intended as an aid in  the diagnosis of influenza from Nasopharyngeal swab specimens and  should not be used as a sole basis for treatment. Nasal washings and  aspirates are unacceptable for Xpert Xpress SARS-CoV-2/FLU/RSV  testing.  Fact Sheet for Patients: PinkCheek.be  Fact Sheet for Healthcare Providers: GravelBags.it  This test is not yet approved or cleared by the [place] FDA and  has been authorized for detection and/or diagnosis of SARS-CoV-2 by  FDA under an Emergency Use Authorization (EUA). This EUA will remain  in effect (meaning this test can be used) for the duration of the  Covid-19 declaration under Section 564(b)(1) of the Act, 21  U.S.C. section 360bbb-3(b)(1), unless the authorization is  terminated or revoked. Performed at [facility], [place] [address], [place], [place] [address]      Patient was seen and examined on the day of discharge and was found to be in stable condition. Time coordinating discharge: 25 minutes including assessment and coordination of care, as well as examination of the patient.   SIGNED:  Dessa Phi, DO [facility] 09/29/2020, 8:45 AM

## 2020-09-29 NOTE — Progress Notes (Signed)
Subjective: No acute changes. Afebrile, vitals stable. LFTs normal. Tolerating PO intake with no abdominal pain. Patient and family would not like to have surgery at this time.   Objective: Vital signs in last 24 hours: Temp:  [97.7 F (36.5 C)-98.8 F (37.1 C)] 98 F (36.7 C) (10/12 0456) Pulse Rate:  [80-85] 85 (10/12 0456) Resp:  [18-20] 20 (10/12 0456) BP: (137-148)/(56-75) 148/75 (10/12 0456) SpO2:  [91 %-98 %] 91 % (10/12 0456) Last BM Date: 09/24/20  Intake/Output from previous day: 10/11 0701 - 10/12 0700 In: 3560 [P.O.:1380; I.V.:1870; IV Piggyback:310] Out: 450 [Urine:450] Intake/Output this shift: No intake/output data recorded.  PE: General: resting comfortably, NAD Neuro: alert and oriented Resp: normal work of breathing Abdomen: soft, nondistended, nontender to palpation. Well-healed midline surgical scar. Extremities: warm and well-perfused  Lab Results:  Recent Labs    09/28/20 0502 09/29/20 0513  WBC 7.9 5.8  HGB 8.3* 8.8*  HCT 25.6* 26.9*  PLT 139* 144*   BMET Recent Labs    09/28/20 0502 09/29/20 0513  NA 141 141  K 3.8 3.7  CL 110 111  CO2 23 22  GLUCOSE 117* 110*  BUN 30* 23  CREATININE 1.45* 1.23  CALCIUM 7.9* 8.1*   PT/INR No results for input(s): LABPROT, INR in the last 72 hours. CMP     Component Value Date/Time   NA 141 09/29/2020 0513   NA 141 11/06/2017 0845   K 3.7 09/29/2020 0513   K 3.9 11/06/2017 0845   CL 111 09/29/2020 0513   CO2 22 09/29/2020 0513   CO2 28 11/06/2017 0845   GLUCOSE 110 (H) 09/29/2020 0513   GLUCOSE 103 11/06/2017 0845   BUN 23 09/29/2020 0513   BUN 24.8 11/06/2017 0845   CREATININE 1.23 09/29/2020 0513   CREATININE 1.40 (H) 08/05/2020 0829   CREATININE 1.6 (H) 11/06/2017 0845   CALCIUM 8.1 (L) 09/29/2020 0513   CALCIUM 8.4 11/06/2017 0845   PROT 5.5 (L) 09/29/2020 0513   PROT 6.3 (L) 11/06/2017 0845   ALBUMIN 3.1 (L) 09/29/2020 0513   ALBUMIN 3.4 (L) 11/06/2017 0845   AST 39  09/29/2020 0513   AST 19 08/05/2020 0829   AST 18 11/06/2017 0845   ALT 53 (H) 09/29/2020 0513   ALT 12 08/05/2020 0829   ALT 13 11/06/2017 0845   ALKPHOS 59 09/29/2020 0513   ALKPHOS 48 11/06/2017 0845   BILITOT 0.5 09/29/2020 0513   BILITOT 0.5 08/05/2020 0829   BILITOT 0.39 11/06/2017 0845   GFRNONAA 52 (L) 09/29/2020 0513   GFRNONAA 44 (L) 08/05/2020 0829   GFRAA 51 (L) 08/05/2020 0829   Lipase     Component Value Date/Time   LIPASE 54 (H) 09/28/2020 0502       Studies/Results: ECHOCARDIOGRAM COMPLETE  Result Date: 09/27/2020    ECHOCARDIOGRAM REPORT   Patient Name:   [name] [name] Date of Exam: 09/27/2020 Medical Rec #:  [id number]  Height:       67.0 in Accession #:    [id number] Weight:       163.0 lb Date of Birth:  [birth year]   BSA:          1.854 m Patient Age:    50 years   BP:           107/71 mmHg Patient Gender: M          HR:           76 bpm. Exam Location:  Inpatient Procedure: 2D Echo Indications:    chest pain 786.50  History:        Patient has prior history of Echocardiogram examinations, most                 recent 01/13/2015. CAD; Risk Factors:Hypertension and                 Dyslipidemia. NSTEMI.  Sonographer:    Jannett Celestine RDCS (AE) Referring Phys: [id number] JENNIFER CHOI IMPRESSIONS  1. Left ventricular ejection fraction, by estimation, is 60 to 65%. The left ventricle has normal function. The left ventricle has no regional wall motion abnormalities. Left ventricular diastolic parameters are indeterminate.  2. Right ventricular systolic function is normal. The right ventricular size is mildly enlarged. Tricuspid regurgitation signal is inadequate for assessing PA pressure.  3. Left atrial size was moderately dilated.  4. Right atrial size was mild to moderately dilated.  5. The mitral valve is grossly normal. Mild mitral valve regurgitation.  6. The aortic valve is grossly normal. Aortic valve regurgitation is not visualized. Comparison(s): Compared to 2016 study,  LVEF appears improbed. RV size is stable, unable to estimate RVSP in this study. FINDINGS  Left Ventricle: Left ventricular ejection fraction, by estimation, is 60 to 65%. The left ventricle has normal function. The left ventricle has no regional wall motion abnormalities. The left ventricular internal cavity size was normal in size. There is  no left ventricular hypertrophy. Left ventricular diastolic parameters are indeterminate. Right Ventricle: The right ventricular size is mildly enlarged. No increase in right ventricular wall thickness. Right ventricular systolic function is normal. Tricuspid regurgitation signal is inadequate for assessing PA pressure. Left Atrium: Left atrial size was moderately dilated. Right Atrium: Right atrial size was mild to moderately dilated. Pericardium: There is no evidence of pericardial effusion. Mitral Valve: The mitral valve is grossly normal. Mild mitral valve regurgitation. Tricuspid Valve: The tricuspid valve is grossly normal. Tricuspid valve regurgitation is trivial. Aortic Valve: The aortic valve is grossly normal. Aortic valve regurgitation is not visualized. Pulmonic Valve: The pulmonic valve was not well visualized. Pulmonic valve regurgitation is trivial. Aorta: The ascending aorta was not well visualized, the aortic arch was not well visualized and the aortic root was not well visualized. IAS/Shunts: The atrial septum is grossly normal.  LEFT VENTRICLE PLAX 2D LVIDd:         5.50 cm  Diastology LVIDs:         3.30 cm  LV e' medial:   7.40 cm/s LV PW:         1.10 cm  LV E/e' medial: 11.2 LV IVS:        0.90 cm LVOT diam:     1.70 cm LV SV:         45 LV SV Index:   24 LVOT Area:     2.27 cm  RIGHT VENTRICLE RV S prime:     18.10 cm/s TAPSE (M-mode): 2.0 cm LEFT ATRIUM             Index       RIGHT ATRIUM           Index LA diam:        4.00 cm 2.16 cm/m  RA Area:     14.80 cm LA Vol (A2C):   51.9 ml 28.00 ml/m RA Volume:   28.10 ml  15.16 ml/m LA Vol (A4C):    44.9 ml 24.22 ml/m LA Biplane Vol: 51.6 ml 27.83  ml/m  AORTIC VALVE LVOT Vmax:   85.00 cm/s LVOT Vmean:  60.000 cm/s LVOT VTI:    0.200 m  AORTA Ao Root diam: 3.10 cm MITRAL VALVE MV Area (PHT): 2.95 cm    SHUNTS MV Decel Time: 257 msec    Systemic VTI:  0.20 m MV E velocity: 83.10 cm/s  Systemic Diam: 1.70 cm Rudean Haskell MD Electronically signed by Rudean Haskell MD Signature Date/Time: 09/27/2020/1:28:19 PM    Final     Anti-infectives: Anti-infectives (From admission, onward)   Start     Dose/Rate Route Frequency Ordered Stop   09/26/20 1730  cefTRIAXone (ROCEPHIN) 2 g in sodium chloride 0.9 % 100 mL IVPB        2 g 200 mL/hr over 30 Minutes Intravenous Every 24 hours 09/26/20 1724     09/26/20 1730  metroNIDAZOLE (FLAGYL) IVPB 500 mg        500 mg 100 mL/hr over 60 Minutes Intravenous Every 8 hours 09/26/20 1724         Assessment/Plan 86 yo male presenting with epigastric pain and mild transaminitis, with mild biliary ductal dilation. This is consistent with an episode of biliary pancreatitis. I had a long discussion with the patient, his wife and their two sons regarding cholecystectomy vs nonoperative management. We discussed the potential risks of perioperative MI given his coronary artery disease; estimated as a 1% risk by cardiology however they noted this is likely underestimated. I also explained that if we do not remove the gallbladder, there is a real risk of recurrent choledocholithiasis with pancreatitis or cholangitis, which can lead to serious illness or death. Patient discussed with his family and they have decided they do not want to have an invasive surgery at this time. Since we will not be doing an intraop cholangiogram, I recommended an MRCP to image the biliary tree and rule out a retained stone or mass lesion, however the patient is very anxious to leave the hospital today. LFTs are normal so I will plan to have him follow up in clinic in 1-2 weeks with  repeat LFTs, and can discuss MRCP at that time. Ok to discharge home when deemed appropriate by the primary team.   LOS: 3 days   Michaelle Birks, MD [facility] General, Hepatobiliary and Pancreatic Surgery 09/29/20 8:03 AM

## 2020-09-29 NOTE — Care Management Important Message (Signed)
Important Message  Patient Details IM Letter given to the Patient Name: [name] [name] MRN: [id number] Date of Birth: Oct 11, 1929   Medicare Important Message Given:  Yes     Kerin Salen 09/29/2020, 11:12 AM

## 2020-11-05 ENCOUNTER — Inpatient Hospital Stay (HOSPITAL_BASED_OUTPATIENT_CLINIC_OR_DEPARTMENT_OTHER): Payer: Medicare Other | Admitting: Nurse Practitioner

## 2020-11-05 ENCOUNTER — Inpatient Hospital Stay: Payer: Medicare Other | Attending: Oncology

## 2020-11-05 ENCOUNTER — Encounter: Payer: Self-pay | Admitting: Nurse Practitioner

## 2020-11-05 ENCOUNTER — Other Ambulatory Visit: Payer: Self-pay

## 2020-11-05 VITALS — BP 171/60 | HR 77 | Temp 98.5°F | Resp 17 | Ht 67.0 in | Wt 163.3 lb

## 2020-11-05 DIAGNOSIS — Z79899 Other long term (current) drug therapy: Secondary | ICD-10-CM | POA: Insufficient documentation

## 2020-11-05 DIAGNOSIS — I252 Old myocardial infarction: Secondary | ICD-10-CM | POA: Diagnosis not present

## 2020-11-05 DIAGNOSIS — C786 Secondary malignant neoplasm of retroperitoneum and peritoneum: Secondary | ICD-10-CM | POA: Diagnosis not present

## 2020-11-05 DIAGNOSIS — C49A3 Gastrointestinal stromal tumor of small intestine: Secondary | ICD-10-CM | POA: Diagnosis present

## 2020-11-05 DIAGNOSIS — E538 Deficiency of other specified B group vitamins: Secondary | ICD-10-CM | POA: Insufficient documentation

## 2020-11-05 LAB — CBC WITH DIFFERENTIAL (CANCER CENTER ONLY)
Abs Immature Granulocytes: 0.01 10*3/uL (ref 0.00–0.07)
Basophils Absolute: 0 10*3/uL (ref 0.0–0.1)
Basophils Relative: 0 %
Eosinophils Absolute: 0 10*3/uL (ref 0.0–0.5)
Eosinophils Relative: 1 %
HCT: 30.5 % — ABNORMAL LOW (ref 39.0–52.0)
Hemoglobin: 10.1 g/dL — ABNORMAL LOW (ref 13.0–17.0)
Immature Granulocytes: 0 %
Lymphocytes Relative: 16 %
Lymphs Abs: 1 10*3/uL (ref 0.7–4.0)
MCH: 35.2 pg — ABNORMAL HIGH (ref 26.0–34.0)
MCHC: 33.1 g/dL (ref 30.0–36.0)
MCV: 106.3 fL — ABNORMAL HIGH (ref 80.0–100.0)
Monocytes Absolute: 0.5 10*3/uL (ref 0.1–1.0)
Monocytes Relative: 7 %
Neutro Abs: 4.8 10*3/uL (ref 1.7–7.7)
Neutrophils Relative %: 76 %
Platelet Count: 215 10*3/uL (ref 150–400)
RBC: 2.87 MIL/uL — ABNORMAL LOW (ref 4.22–5.81)
RDW: 13.2 % (ref 11.5–15.5)
WBC Count: 6.3 10*3/uL (ref 4.0–10.5)
nRBC: 0 % (ref 0.0–0.2)

## 2020-11-05 LAB — CMP (CANCER CENTER ONLY)
ALT: 13 U/L (ref 0–44)
AST: 19 U/L (ref 15–41)
Albumin: 3.6 g/dL (ref 3.5–5.0)
Alkaline Phosphatase: 55 U/L (ref 38–126)
Anion gap: 8 (ref 5–15)
BUN: 17 mg/dL (ref 8–23)
CO2: 24 mmol/L (ref 22–32)
Calcium: 8.5 mg/dL — ABNORMAL LOW (ref 8.9–10.3)
Chloride: 111 mmol/L (ref 98–111)
Creatinine: 1.34 mg/dL — ABNORMAL HIGH (ref 0.61–1.24)
GFR, Estimated: 51 mL/min — ABNORMAL LOW (ref >60)
Glucose, Bld: 103 mg/dL — ABNORMAL HIGH (ref 70–99)
Potassium: 3.6 mmol/L (ref 3.5–5.1)
Sodium: 143 mmol/L (ref 135–145)
Total Bilirubin: 0.5 mg/dL (ref 0.3–1.2)
Total Protein: 6.5 g/dL (ref 6.5–8.1)

## 2020-11-05 NOTE — Progress Notes (Signed)
[name] OFFICE PROGRESS NOTE   Diagnosis: Gastrointestinal stromal tumor  INTERVAL HISTORY:   [name] returns as scheduled.  He continues [place].  He was hospitalized 09/26/2020 through 09/29/2020 with abdominal pain.  Imaging showed cholelithiasis/question cholecystitis.  He was seen by surgery and offered cholecystectomy versus nonoperative management.  He was discharged and scheduled outpatient follow-up with surgery.  The abdominal pain resolved and has not recurred.  No fever or chills.  He has occasional diarrhea.  No nausea or vomiting.  No rash.  Objective:  Vital signs in last 24 hours:  Blood pressure (!) 171/60, pulse 77, temperature 98.5 F (36.9 C), temperature source Tympanic, resp. rate 17, height 5' 7"  (1.702 m), weight 163 lb 4.8 oz (74.1 kg), SpO2 99 %.    HEENT: Neck without mass. Lymphatics: No palpable cervical, supraclavicular or axillary lymph nodes. Resp: Lungs clear bilaterally. Cardio: Regular rate and rhythm. GI: Abdomen soft and nontender.  No hepatosplenomegaly.  No mass. Vascular: Trace edema lower leg bilaterally. Skin: No rash.   Lab Results:  Lab Results  Component Value Date   WBC 6.3 11/05/2020   HGB 10.1 (L) 11/05/2020   HCT 30.5 (L) 11/05/2020   MCV 106.3 (H) 11/05/2020   PLT 215 11/05/2020   NEUTROABS 4.8 11/05/2020    Imaging:  No results found.  Medications: I have reviewed the patient's current medications.  Assessment/Plan: 1. 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  CT 10/16/2014 consistent with extensive carcinomatosis, CT-guided biopsy of an omental mass 10/21/2014 confirmed a gastrointestinal stromal tumor  Initiation of Gleevec 10/31/2014  CT 01/13/2015 revealed improvement in the peritoneal and omental metastatic disease  CT 05/05/2015 with no progression of omental/peritoneal metastatic disease, increased ascites, and appendix inflammation  CT  07/07/2016-no abscess, mild perihepatic and left pericolic ascites and mesenteric edema  CT of the pelvis 09/20/2016, no residual abscess, no ascites  CT abdomen pelvis renal stones study 04/03/2017-partial small bowel obstruction, potentially related to right inguinal hernia  CT abdomen/pelvis 08/18/2017-possible cholecystitis, 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  Cholelithiasis.  2. History of anemia, status post a nondiagnostic bone marrow biopsy 10/21/2014  3. NSTEMI March 2014  4. Admission 05/06/2015 with acute onset right abdomen pain-potentially related to acute appendicitis versus pain from carcinomatosis  CT 05/13/2015 consistent with a right lower abdomen abscess, status post catheter drainage by interventional radiology 05/13/2015  Follow-up CT 05/19/2015 showed resolution of the abscess.  Follow-up evaluation in interventional radiology 06/01/2015 showed the abscess cavity had resolved. The abscess drainage catheter communicated with the cecum via the appendix. The catheter was left in place.  CT abdomen/pelvis 06/08/2015 showed the right pelvic drain in place without recurrent or residual surrounding fluid collection. Peritoneal metastasis with slight increase in small to moderate volume of abdominal pelvic ascites.  Drainage catheter injection 06/12/2015 showed residual tiny fistulous connection with the end of the decompressed abscess cavity within the right lower abdominal quadrant and the residual lumen of the appendix.  Paracentesis 06/12/2015 with 5 liters of fluid removed  Paracentesis 06/19/2015-negative cytology  and culture  Removal of abscess drainage catheter 06/23/2015  CT 07/26/2015 with increased right abdominal wall inflammation and increased fluid collection  at site of previous right abdomen abscess, ascites, and enlargement/inflammation of the appendix  Placement of right lower quadrant percutaneous drainage catheter 07/27/2015. Contrast injection confirmed persistent fistulous connection with the ill-defined fluid collection and the residual appendix and cecum. Paracentesis with 2 L of fluid removed.  Follow-up CT abdomen/pelvis 08/05/2015 with small to moderate left pleural effusion and moderate to large volume ascites with both appearing slightly decreased since the prior study. Right lower quadrant drainage catheter in place. 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 Drainage catheter left in place.  Tube evaluation 02/10/2016, persistent fistula  Tube evaluation 03/01/2016, continued fistula to the cecum  Tube downsized 05/04/2016, no residual abscess seen  Collapsed abscess with a stable fistula to the cecum on imaging 06/16/2016  Tube removed 07/26/2016  Recurrent drainage from the right lower quadrant tube site August 2018, status post antibiotics with improvement, drainage again November 2018-antibiotic prescribed by Dr. [name]   5. C. difficile colitis 05/10/2015, [place] Cranker 2019  6. Right leg edema 12/18/2015. Negative venous Doppler.  7.  Renal insufficiency  Disposition: [name] appears stable.  There is no clinical evidence of disease progression.  He will continue Gleevec.  We reviewed the CBC from today.  Counts are stable.  We discussed the low B12 level from 08/05/2020.  I encouraged him to begin oral B12 as was previously recommended.  He will return for lab and follow-up in 3 months.  He will contact the office in the interim with  any problems.    Ned Card ANP/GNP-BC   11/05/2020  9:20 AM

## 2020-11-09 ENCOUNTER — Telehealth: Payer: Self-pay

## 2020-11-24 NOTE — Telephone Encounter (Signed)
Oral Oncology Patient Advocate Encounter  Met patient in [place] to complete a re-enrollment application for [employer] [facility] [place] ([employer]) in an effort to reduce the patient's out of pocket expense for Gleevec to $0.    Application completed and faxed to [phone].   [employer] phone number for follow up is [phone].   This encounter will be updated until final determination.   [place] Patient [place] Phone [phone] Fax [phone] 11/24/2020 3:37 PM

## 2020-12-25 DIAGNOSIS — L570 Actinic keratosis: Secondary | ICD-10-CM | POA: Diagnosis not present

## 2020-12-25 DIAGNOSIS — C44629 Squamous cell carcinoma of skin of left upper limb, including shoulder: Secondary | ICD-10-CM | POA: Diagnosis not present

## 2020-12-25 DIAGNOSIS — X32XXXA Exposure to sunlight, initial encounter: Secondary | ICD-10-CM | POA: Diagnosis not present

## 2021-01-06 DIAGNOSIS — H40013 Open angle with borderline findings, low risk, bilateral: Secondary | ICD-10-CM | POA: Diagnosis not present

## 2021-01-07 DIAGNOSIS — Z85828 Personal history of other malignant neoplasm of skin: Secondary | ICD-10-CM | POA: Diagnosis not present

## 2021-01-07 DIAGNOSIS — Z08 Encounter for follow-up examination after completed treatment for malignant neoplasm: Secondary | ICD-10-CM | POA: Diagnosis not present

## 2021-01-12 NOTE — Telephone Encounter (Signed)
Patient is approved for Gleevec at no charge from [employer] 01/12/21-12/18/21  [place] Patient [place] Phone [phone] Fax [phone] 01/12/2021 2:24 PM

## 2021-01-26 DIAGNOSIS — Z08 Encounter for follow-up examination after completed treatment for malignant neoplasm: Secondary | ICD-10-CM | POA: Diagnosis not present

## 2021-01-26 DIAGNOSIS — Z85828 Personal history of other malignant neoplasm of skin: Secondary | ICD-10-CM | POA: Diagnosis not present

## 2021-01-26 DIAGNOSIS — L928 Other granulomatous disorders of the skin and subcutaneous tissue: Secondary | ICD-10-CM | POA: Diagnosis not present

## 2021-02-04 ENCOUNTER — Inpatient Hospital Stay: Payer: Medicare Other | Attending: Oncology | Admitting: Oncology

## 2021-02-04 ENCOUNTER — Inpatient Hospital Stay: Payer: Medicare Other

## 2021-02-04 ENCOUNTER — Telehealth: Payer: Self-pay | Admitting: Oncology

## 2021-02-04 ENCOUNTER — Other Ambulatory Visit: Payer: Self-pay

## 2021-02-04 VITALS — BP 158/57 | HR 91 | Temp 98.6°F | Resp 16 | Ht 67.0 in | Wt 159.3 lb

## 2021-02-04 DIAGNOSIS — Z79899 Other long term (current) drug therapy: Secondary | ICD-10-CM | POA: Diagnosis not present

## 2021-02-04 DIAGNOSIS — E876 Hypokalemia: Secondary | ICD-10-CM | POA: Diagnosis not present

## 2021-02-04 DIAGNOSIS — C49A3 Gastrointestinal stromal tumor of small intestine: Secondary | ICD-10-CM | POA: Diagnosis not present

## 2021-02-04 LAB — CMP (CANCER CENTER ONLY)
ALT: 15 U/L (ref 0–44)
AST: 20 U/L (ref 15–41)
Albumin: 3.7 g/dL (ref 3.5–5.0)
Alkaline Phosphatase: 56 U/L (ref 38–126)
Anion gap: 6 (ref 5–15)
BUN: 15 mg/dL (ref 8–23)
CO2: 28 mmol/L (ref 22–32)
Calcium: 8.3 mg/dL — ABNORMAL LOW (ref 8.9–10.3)
Chloride: 109 mmol/L (ref 98–111)
Creatinine: 1.47 mg/dL — ABNORMAL HIGH (ref 0.61–1.24)
GFR, Estimated: 45 mL/min — ABNORMAL LOW (ref >60)
Glucose, Bld: 106 mg/dL — ABNORMAL HIGH (ref 70–99)
Potassium: 3.2 mmol/L — ABNORMAL LOW (ref 3.5–5.1)
Sodium: 143 mmol/L (ref 135–145)
Total Bilirubin: 0.5 mg/dL (ref 0.3–1.2)
Total Protein: 6.5 g/dL (ref 6.5–8.1)

## 2021-02-04 LAB — CBC WITH DIFFERENTIAL (CANCER CENTER ONLY)
Abs Immature Granulocytes: 0.01 10*3/uL (ref 0.00–0.07)
Basophils Absolute: 0 10*3/uL (ref 0.0–0.1)
Basophils Relative: 0 %
Eosinophils Absolute: 0.7 10*3/uL — ABNORMAL HIGH (ref 0.0–0.5)
Eosinophils Relative: 12 %
HCT: 28.7 % — ABNORMAL LOW (ref 39.0–52.0)
Hemoglobin: 9.7 g/dL — ABNORMAL LOW (ref 13.0–17.0)
Immature Granulocytes: 0 %
Lymphocytes Relative: 17 %
Lymphs Abs: 1.1 10*3/uL (ref 0.7–4.0)
MCH: 35.8 pg — ABNORMAL HIGH (ref 26.0–34.0)
MCHC: 33.8 g/dL (ref 30.0–36.0)
MCV: 105.9 fL — ABNORMAL HIGH (ref 80.0–100.0)
Monocytes Absolute: 0.5 10*3/uL (ref 0.1–1.0)
Monocytes Relative: 8 %
Neutro Abs: 3.9 10*3/uL (ref 1.7–7.7)
Neutrophils Relative %: 63 %
Platelet Count: 180 10*3/uL (ref 150–400)
RBC: 2.71 MIL/uL — ABNORMAL LOW (ref 4.22–5.81)
RDW: 13.6 % (ref 11.5–15.5)
WBC Count: 6.3 10*3/uL (ref 4.0–10.5)
nRBC: 0 % (ref 0.0–0.2)

## 2021-02-04 MED ORDER — POTASSIUM CHLORIDE CRYS ER 20 MEQ PO TBCR: 20.0000 meq | EXTENDED_RELEASE_TABLET | Freq: Every day | ORAL | 5 refills | Status: DC

## 2021-02-04 NOTE — Telephone Encounter (Signed)
Scheduled appointments per 2/17 los. Spoke to patient who is aware of appointments date and times. Gave patient calendar print out.

## 2021-02-04 NOTE — Progress Notes (Signed)
[name] [name] OFFICE PROGRESS NOTE   Diagnosis: Gastrointestinal stromal tumor  INTERVAL HISTORY:   [name] [name] returns as scheduled.  He continues [place].  No diarrhea or swelling.  He feels well.  He had an episode of left lower abdominal pain 1 week ago.  This occurred in the evening and spontaneously resolved.  Objective:  Vital signs in last 24 hours:  Blood pressure (!) 158/57, pulse 91, temperature 98.6 F (37 C), temperature source Tympanic, resp. rate 16, height 5' 7"  (1.702 m), weight 159 lb 4.8 oz (72.3 kg), SpO2 99 %.    HEENT: Mild white coat over the tongue and left buccal mucosa.  Mild periorbital edema Resp: Lungs clear bilaterally Cardio: Regular rate and rhythm with occasional premature beats GI: No hepatosplenomegaly, no apparent ascites, no mass, nontender, right inguinal hernia extending into the scrotum Vascular: No leg edema   Portacath/PICC-without erythema  Lab Results:  Lab Results  Component Value Date   WBC 6.3 02/04/2021   HGB 9.7 (L) 02/04/2021   HCT 28.7 (L) 02/04/2021   MCV 105.9 (H) 02/04/2021   PLT 180 02/04/2021   NEUTROABS 3.9 02/04/2021    CMP  Lab Results  Component Value Date   NA 143 02/04/2021   K 3.2 (L) 02/04/2021   CL 109 02/04/2021   CO2 28 02/04/2021   GLUCOSE 106 (H) 02/04/2021   BUN 15 02/04/2021   CREATININE 1.47 (H) 02/04/2021   CALCIUM 8.3 (L) 02/04/2021   PROT 6.5 02/04/2021   ALBUMIN 3.7 02/04/2021   AST 20 02/04/2021   ALT 15 02/04/2021   ALKPHOS 56 02/04/2021   BILITOT 0.5 02/04/2021   GFRNONAA 45 (L) 02/04/2021   GFRAA 51 (L) 08/05/2020    Medications: I have reviewed the patient's current medications.   Assessment/Plan: 1. 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  CT 10/16/2014 consistent with extensive carcinomatosis, CT-guided biopsy of an omental mass 10/21/2014 confirmed a gastrointestinal stromal tumor  Initiation of Gleevec  10/31/2014  CT 01/13/2015 revealed improvement in the peritoneal and omental metastatic disease  CT 05/05/2015 with no progression of omental/peritoneal metastatic disease, increased ascites, and appendix inflammation  CT 07/07/2016-no abscess, mild perihepatic and left pericolic ascites and mesenteric edema  CT of the pelvis 09/20/2016, no residual abscess, no ascites  CT abdomen pelvis renal stones study 04/03/2017-partial small bowel obstruction, potentially related to right inguinal hernia  CT abdomen/pelvis 08/18/2017-possible cholecystitis, 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  Cholelithiasis.  2. History of anemia, status post a nondiagnostic bone marrow biopsy 10/21/2014  3. NSTEMI March 2014  4. Admission 05/06/2015 with acute onset right abdomen pain-potentially related to acute appendicitis versus pain from carcinomatosis  CT 05/13/2015 consistent with a right lower abdomen abscess, status post catheter drainage by interventional radiology 05/13/2015  Follow-up CT 05/19/2015 showed resolution of the abscess.  Follow-up evaluation in interventional radiology 06/01/2015 showed the abscess cavity had resolved. The abscess drainage catheter communicated with the cecum via the appendix. The catheter was left in place.  CT abdomen/pelvis 06/08/2015 showed the right pelvic drain in place without recurrent or residual surrounding fluid collection. Peritoneal metastasis with slight increase in small to moderate volume of abdominal pelvic  ascites.  Drainage catheter injection 06/12/2015 showed residual tiny fistulous connection with  the end of the decompressed abscess cavity within the right lower abdominal quadrant and the residual lumen of the appendix.  Paracentesis 06/12/2015 with 5 liters of fluid removed  Paracentesis 06/19/2015-negative cytology and culture  Removal of abscess drainage catheter 06/23/2015  CT 07/26/2015 with increased right abdominal wall inflammation and increased fluid collection at site of previous right abdomen abscess, ascites, and enlargement/inflammation of the appendix  Placement of right lower quadrant percutaneous drainage catheter 07/27/2015. Contrast injection confirmed persistent fistulous connection with the ill-defined fluid collection and the residual appendix and cecum. Paracentesis with 2 L of fluid removed.  Follow-up CT abdomen/pelvis 08/05/2015 with small to moderate left pleural effusion and moderate to large volume ascites with both appearing slightly decreased since the prior study. Right lower quadrant drainage catheter in place. 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 Drainage catheter left in place.  Tube evaluation 02/10/2016, persistent fistula  Tube evaluation 03/01/2016, continued fistula to the cecum  Tube downsized 05/04/2016, no residual abscess seen  Collapsed abscess with a stable fistula to the cecum on imaging 06/16/2016  Tube removed 07/26/2016  Recurrent drainage from the right lower quadrant tube site August 2018, status post antibiotics with improvement, drainage again November 2018-antibiotic prescribed by Dr. [name]   5. C. difficile colitis 05/10/2015, [facility] Cranker 2019  6. Right leg edema 12/18/2015. Negative venous Doppler.  7.  Renal insufficiency    Disposition: [name] [name] appears stable.  He will call for recurrent abdominal pain.  He continues [place].  The potassium is mildly low today.  This is likely  secondary to Lasix.  He will begin a potassium supplement.  [name] [name] return for an office visit in 3 months.  Betsy Coder, MD  02/04/2021  9:10 AM

## 2021-02-15 DIAGNOSIS — L980 Pyogenic granuloma: Secondary | ICD-10-CM | POA: Diagnosis not present

## 2021-02-15 DIAGNOSIS — Z85828 Personal history of other malignant neoplasm of skin: Secondary | ICD-10-CM | POA: Diagnosis not present

## 2021-02-15 DIAGNOSIS — Z08 Encounter for follow-up examination after completed treatment for malignant neoplasm: Secondary | ICD-10-CM | POA: Diagnosis not present

## 2021-02-17 DIAGNOSIS — Z0000 Encounter for general adult medical examination without abnormal findings: Secondary | ICD-10-CM | POA: Diagnosis not present

## 2021-02-17 DIAGNOSIS — I248 Other forms of acute ischemic heart disease: Secondary | ICD-10-CM | POA: Diagnosis not present

## 2021-02-17 DIAGNOSIS — C49A3 Gastrointestinal stromal tumor of small intestine: Secondary | ICD-10-CM | POA: Diagnosis not present

## 2021-02-17 DIAGNOSIS — I700 Atherosclerosis of aorta: Secondary | ICD-10-CM | POA: Diagnosis not present

## 2021-02-17 DIAGNOSIS — I10 Essential (primary) hypertension: Secondary | ICD-10-CM | POA: Diagnosis not present

## 2021-02-17 DIAGNOSIS — E46 Unspecified protein-calorie malnutrition: Secondary | ICD-10-CM | POA: Diagnosis not present

## 2021-02-17 DIAGNOSIS — N183 Chronic kidney disease, stage 3 unspecified: Secondary | ICD-10-CM | POA: Diagnosis not present

## 2021-02-17 DIAGNOSIS — Z23 Encounter for immunization: Secondary | ICD-10-CM | POA: Diagnosis not present

## 2021-02-17 DIAGNOSIS — E782 Mixed hyperlipidemia: Secondary | ICD-10-CM | POA: Diagnosis not present

## 2021-03-16 ENCOUNTER — Telehealth: Payer: Self-pay | Admitting: Oncology

## 2021-03-16 NOTE — Telephone Encounter (Signed)
Updated appointments at [facility] letter & calendar was mailed  

## 2021-04-16 DIAGNOSIS — Z23 Encounter for immunization: Secondary | ICD-10-CM | POA: Diagnosis not present

## 2021-05-04 ENCOUNTER — Inpatient Hospital Stay: Payer: Medicare Other

## 2021-05-04 ENCOUNTER — Encounter: Payer: Self-pay | Admitting: Nurse Practitioner

## 2021-05-04 ENCOUNTER — Telehealth: Payer: Self-pay

## 2021-05-04 ENCOUNTER — Inpatient Hospital Stay: Payer: Medicare Other | Attending: Nurse Practitioner | Admitting: Nurse Practitioner

## 2021-05-04 ENCOUNTER — Other Ambulatory Visit: Payer: Medicare Other

## 2021-05-04 ENCOUNTER — Other Ambulatory Visit: Payer: Self-pay

## 2021-05-04 VITALS — BP 177/61 | HR 96 | Temp 98.1°F | Resp 18 | Ht 67.0 in | Wt 158.6 lb

## 2021-05-04 DIAGNOSIS — Z9049 Acquired absence of other specified parts of digestive tract: Secondary | ICD-10-CM | POA: Insufficient documentation

## 2021-05-04 DIAGNOSIS — C49A3 Gastrointestinal stromal tumor of small intestine: Secondary | ICD-10-CM | POA: Diagnosis not present

## 2021-05-04 DIAGNOSIS — C786 Secondary malignant neoplasm of retroperitoneum and peritoneum: Secondary | ICD-10-CM | POA: Diagnosis not present

## 2021-05-04 DIAGNOSIS — C7951 Secondary malignant neoplasm of bone: Secondary | ICD-10-CM | POA: Diagnosis not present

## 2021-05-04 DIAGNOSIS — Z79899 Other long term (current) drug therapy: Secondary | ICD-10-CM | POA: Insufficient documentation

## 2021-05-04 DIAGNOSIS — M79662 Pain in left lower leg: Secondary | ICD-10-CM | POA: Diagnosis not present

## 2021-05-04 DIAGNOSIS — I252 Old myocardial infarction: Secondary | ICD-10-CM | POA: Diagnosis not present

## 2021-05-04 DIAGNOSIS — N289 Disorder of kidney and ureter, unspecified: Secondary | ICD-10-CM | POA: Insufficient documentation

## 2021-05-04 DIAGNOSIS — M79661 Pain in right lower leg: Secondary | ICD-10-CM | POA: Insufficient documentation

## 2021-05-04 LAB — CMP (CANCER CENTER ONLY)
ALT: 15 U/L (ref 0–44)
AST: 23 U/L (ref 15–41)
Albumin: 3.9 g/dL (ref 3.5–5.0)
Alkaline Phosphatase: 45 U/L (ref 38–126)
Anion gap: 7 (ref 5–15)
BUN: 24 mg/dL — ABNORMAL HIGH (ref 8–23)
CO2: 26 mmol/L (ref 22–32)
Calcium: 8.3 mg/dL — ABNORMAL LOW (ref 8.9–10.3)
Chloride: 109 mmol/L (ref 98–111)
Creatinine: 1.59 mg/dL — ABNORMAL HIGH (ref 0.61–1.24)
GFR, Estimated: 41 mL/min — ABNORMAL LOW (ref >60)
Glucose, Bld: 107 mg/dL — ABNORMAL HIGH (ref 70–99)
Potassium: 4.1 mmol/L (ref 3.5–5.1)
Sodium: 142 mmol/L (ref 135–145)
Total Bilirubin: 0.4 mg/dL (ref 0.3–1.2)
Total Protein: 6.2 g/dL — ABNORMAL LOW (ref 6.5–8.1)

## 2021-05-04 LAB — MAGNESIUM: Magnesium: 2 mg/dL (ref 1.7–2.4)

## 2021-05-04 LAB — CBC WITH DIFFERENTIAL (CANCER CENTER ONLY)
Abs Immature Granulocytes: 0.01 10*3/uL (ref 0.00–0.07)
Basophils Absolute: 0 10*3/uL (ref 0.0–0.1)
Basophils Relative: 0 %
Eosinophils Absolute: 0.3 10*3/uL (ref 0.0–0.5)
Eosinophils Relative: 6 %
HCT: 30.2 % — ABNORMAL LOW (ref 39.0–52.0)
Hemoglobin: 9.9 g/dL — ABNORMAL LOW (ref 13.0–17.0)
Immature Granulocytes: 0 %
Lymphocytes Relative: 27 %
Lymphs Abs: 1.2 10*3/uL (ref 0.7–4.0)
MCH: 35.9 pg — ABNORMAL HIGH (ref 26.0–34.0)
MCHC: 32.8 g/dL (ref 30.0–36.0)
MCV: 109.4 fL — ABNORMAL HIGH (ref 80.0–100.0)
Monocytes Absolute: 0.5 10*3/uL (ref 0.1–1.0)
Monocytes Relative: 10 %
Neutro Abs: 2.6 10*3/uL (ref 1.7–7.7)
Neutrophils Relative %: 57 %
Platelet Count: 199 10*3/uL (ref 150–400)
RBC: 2.76 MIL/uL — ABNORMAL LOW (ref 4.22–5.81)
RDW: 13.3 % (ref 11.5–15.5)
WBC Count: 4.5 10*3/uL (ref 4.0–10.5)
nRBC: 0 % (ref 0.0–0.2)

## 2021-05-04 NOTE — Telephone Encounter (Signed)
Called no answer message left making pt aware potassium WNL and to continue 1 tab po QD please call for any questions concerns or changes

## 2021-05-04 NOTE — Progress Notes (Addendum)
[facility] OFFICE PROGRESS NOTE   Diagnosis: Gastrointestinal stromal tumor  INTERVAL HISTORY:   [name] returns as scheduled.  He continues Bridger.  He feels well.  He reports a good appetite.  No nausea or vomiting.  No diarrhea.  No leg swelling.  He has occasional bilateral calf pain.  Stable periorbital edema.  No shortness of breath or cough.  No fever.  He denies abdominal pain.  He reports normal blood pressure readings at home.  Objective:  Vital signs in last 24 hours:  Blood pressure (!) 176/69, pulse 96, temperature 98.1 F (36.7 C), temperature source Oral, resp. rate 18, height 5' 7"  (1.702 m), weight 158 lb 9.6 oz (71.9 kg), SpO2 100 %.    HEENT: Mild periorbital edema.  No thrush or ulcers. Resp: Lungs clear bilaterally. Cardio: Regular rate and rhythm. GI: Abdomen soft and nontender.  No hepatosplenomegaly.  No apparent ascites.  No mass. Vascular: Trace bilateral lower leg edema. Skin: No rash.   Lab Results:  Lab Results  Component Value Date   WBC 4.5 05/04/2021   HGB 9.9 (L) 05/04/2021   HCT 30.2 (L) 05/04/2021   MCV 109.4 (H) 05/04/2021   PLT 199 05/04/2021   NEUTROABS 2.6 05/04/2021    Imaging:  No results found.  Medications: I have reviewed the patient's current medications.  Assessment/Plan: 1. 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  CT 10/16/2014 consistent with extensive carcinomatosis, CT-guided biopsy of an omental mass 10/21/2014 confirmed a gastrointestinal stromal tumor  Initiation of Gleevec 10/31/2014  CT 01/13/2015 revealed improvement in the peritoneal and omental metastatic disease  CT 05/05/2015 with no progression of omental/peritoneal metastatic disease, increased ascites, and appendix inflammation  CT 07/07/2016-no abscess, mild perihepatic and left pericolic ascites and mesenteric edema  CT of the pelvis 09/20/2016, no residual abscess, no  ascites  CT abdomen pelvis renal stones study 04/03/2017-partial small bowel obstruction, potentially related to right inguinal hernia  CT abdomen/pelvis 08/18/2017-possible cholecystitis, 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  Cholelithiasis.  2. History of anemia, status post a nondiagnostic bone marrow biopsy 10/21/2014  3. NSTEMI March 2014  4. Admission 05/06/2015 with acute onset right abdomen pain-potentially related to acute appendicitis versus pain from carcinomatosis  CT 05/13/2015 consistent with a right lower abdomen abscess, status post catheter drainage by interventional radiology 05/13/2015  Follow-up CT 05/19/2015 showed resolution of the abscess.  Follow-up evaluation in interventional radiology 06/01/2015 showed the abscess cavity had resolved. The abscess drainage catheter communicated with the cecum via the appendix. The catheter was left in place.  CT abdomen/pelvis 06/08/2015 showed the right pelvic drain in place without recurrent or residual surrounding fluid collection. Peritoneal metastasis with slight increase in small to moderate volume of abdominal pelvic ascites.  Drainage catheter injection 06/12/2015 showed residual tiny fistulous connection with the end of the decompressed abscess cavity within the right lower abdominal quadrant and the residual lumen of the appendix.  Paracentesis 06/12/2015 with 5 liters of fluid removed  Paracentesis 06/19/2015-negative cytology and culture  Removal of abscess drainage catheter 06/23/2015  CT 07/26/2015 with increased right abdominal wall inflammation and increased  fluid collection at site of previous right abdomen abscess,  ascites, and enlargement/inflammation of the appendix  Placement of right lower quadrant percutaneous drainage catheter 07/27/2015. Contrast injection confirmed persistent fistulous connection with the ill-defined fluid collection and the residual appendix and cecum. Paracentesis with 2 L of fluid removed.  Follow-up CT abdomen/pelvis 08/05/2015 with small to moderate left pleural effusion and moderate to large volume ascites with both appearing slightly decreased since the prior study. Right lower quadrant drainage catheter in place. 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 Drainage catheter left in place.  Tube evaluation 02/10/2016, persistent fistula  Tube evaluation 03/01/2016, continued fistula to the cecum  Tube downsized 05/04/2016, no residual abscess seen  Collapsed abscess with a stable fistula to the cecum on imaging 06/16/2016  Tube removed 07/26/2016  Recurrent drainage from the right lower quadrant tube site August 2018, status post antibiotics with improvement, drainage again November 2018-antibiotic prescribed by Dr. [name]   5. C. difficile colitis 05/10/2015, [place] Cranker 2019  6. Right leg edema 12/18/2015. Negative venous Doppler.  7. Renal insufficiency   Disposition: [name] appears stable.  There is no clinical evidence of disease progression.  He will continue Gleevec.  He will return for lab and follow-up in 3 months.  Patient seen with Dr. [name].    Ned Card ANP/GNP-BC   05/04/2021  9:13 AM  This was a shared visit with Ned Card.  Mr. Stratton is in clinical remission from a gastrointestinal stromal tumor.  He will continue Gleevec.  I was present for today's visit.  I performed medical decision making.  Julieanne Manson, MD

## 2021-05-14 DIAGNOSIS — T24011A Burn of unspecified degree of right thigh, initial encounter: Secondary | ICD-10-CM | POA: Diagnosis not present

## 2021-06-02 DIAGNOSIS — R300 Dysuria: Secondary | ICD-10-CM | POA: Diagnosis not present

## 2021-06-04 ENCOUNTER — Encounter (HOSPITAL_COMMUNITY): Payer: Self-pay | Admitting: Radiology

## 2021-06-04 ENCOUNTER — Other Ambulatory Visit: Payer: Self-pay

## 2021-06-04 ENCOUNTER — Emergency Department (HOSPITAL_COMMUNITY): Payer: Medicare Other

## 2021-06-04 ENCOUNTER — Emergency Department (HOSPITAL_COMMUNITY)
Admission: EM | Admit: 2021-06-04 | Discharge: 2021-06-04 | Disposition: A | Discharge status: Home / Self Care | Payer: Medicare Other | Attending: Emergency Medicine | Admitting: Emergency Medicine

## 2021-06-04 DIAGNOSIS — K8050 Calculus of bile duct without cholangitis or cholecystitis without obstruction: Secondary | ICD-10-CM | POA: Diagnosis not present

## 2021-06-04 DIAGNOSIS — R339 Retention of urine, unspecified: Secondary | ICD-10-CM | POA: Diagnosis not present

## 2021-06-04 DIAGNOSIS — N183 Chronic kidney disease, stage 3 unspecified: Secondary | ICD-10-CM | POA: Insufficient documentation

## 2021-06-04 DIAGNOSIS — R3121 Asymptomatic microscopic hematuria: Secondary | ICD-10-CM | POA: Diagnosis not present

## 2021-06-04 DIAGNOSIS — I130 Hypertensive heart and chronic kidney disease with heart failure and stage 1 through stage 4 chronic kidney disease, or unspecified chronic kidney disease: Secondary | ICD-10-CM | POA: Insufficient documentation

## 2021-06-04 DIAGNOSIS — R3129 Other microscopic hematuria: Secondary | ICD-10-CM | POA: Diagnosis not present

## 2021-06-04 DIAGNOSIS — K4090 Unilateral inguinal hernia, without obstruction or gangrene, not specified as recurrent: Secondary | ICD-10-CM | POA: Diagnosis not present

## 2021-06-04 DIAGNOSIS — R110 Nausea: Secondary | ICD-10-CM | POA: Diagnosis not present

## 2021-06-04 DIAGNOSIS — I959 Hypotension, unspecified: Secondary | ICD-10-CM | POA: Diagnosis not present

## 2021-06-04 DIAGNOSIS — Z79899 Other long term (current) drug therapy: Secondary | ICD-10-CM | POA: Diagnosis not present

## 2021-06-04 DIAGNOSIS — K8070 Calculus of gallbladder and bile duct without cholecystitis without obstruction: Secondary | ICD-10-CM | POA: Diagnosis not present

## 2021-06-04 DIAGNOSIS — R39198 Other difficulties with micturition: Secondary | ICD-10-CM | POA: Diagnosis not present

## 2021-06-04 DIAGNOSIS — R300 Dysuria: Secondary | ICD-10-CM | POA: Diagnosis present

## 2021-06-04 DIAGNOSIS — I2510 Atherosclerotic heart disease of native coronary artery without angina pectoris: Secondary | ICD-10-CM | POA: Diagnosis not present

## 2021-06-04 DIAGNOSIS — I5033 Acute on chronic diastolic (congestive) heart failure: Secondary | ICD-10-CM | POA: Diagnosis not present

## 2021-06-04 LAB — URINALYSIS, ROUTINE W REFLEX MICROSCOPIC
Bilirubin Urine: NEGATIVE
Glucose, UA: NEGATIVE mg/dL
Ketones, ur: NEGATIVE mg/dL
Leukocytes,Ua: NEGATIVE
Nitrite: NEGATIVE
Protein, ur: 30 mg/dL — AB
Specific Gravity, Urine: 1.025 (ref 1.005–1.030)
pH: 6 (ref 5.0–8.0)

## 2021-06-04 LAB — CBC WITH DIFFERENTIAL/PLATELET
Abs Immature Granulocytes: 0 10*3/uL (ref 0.00–0.07)
Basophils Absolute: 0 10*3/uL (ref 0.0–0.1)
Basophils Relative: 1 %
Eosinophils Absolute: 0.1 10*3/uL (ref 0.0–0.5)
Eosinophils Relative: 1 %
HCT: 30.1 % — ABNORMAL LOW (ref 39.0–52.0)
Hemoglobin: 9.9 g/dL — ABNORMAL LOW (ref 13.0–17.0)
Immature Granulocytes: 0 %
Lymphocytes Relative: 17 %
Lymphs Abs: 0.6 10*3/uL — ABNORMAL LOW (ref 0.7–4.0)
MCH: 36.1 pg — ABNORMAL HIGH (ref 26.0–34.0)
MCHC: 32.9 g/dL (ref 30.0–36.0)
MCV: 109.9 fL — ABNORMAL HIGH (ref 80.0–100.0)
Monocytes Absolute: 0.4 10*3/uL (ref 0.1–1.0)
Monocytes Relative: 10 %
Neutro Abs: 2.7 10*3/uL (ref 1.7–7.7)
Neutrophils Relative %: 71 %
Platelets: 166 10*3/uL (ref 150–400)
RBC: 2.74 MIL/uL — ABNORMAL LOW (ref 4.22–5.81)
RDW: 13.6 % (ref 11.5–15.5)
WBC: 3.8 10*3/uL — ABNORMAL LOW (ref 4.0–10.5)
nRBC: 0 % (ref 0.0–0.2)

## 2021-06-04 LAB — BASIC METABOLIC PANEL
Anion gap: 9 (ref 5–15)
BUN: 24 mg/dL — ABNORMAL HIGH (ref 8–23)
CO2: 23 mmol/L (ref 22–32)
Calcium: 8.6 mg/dL — ABNORMAL LOW (ref 8.9–10.3)
Chloride: 107 mmol/L (ref 98–111)
Creatinine, Ser: 1.7 mg/dL — ABNORMAL HIGH (ref 0.61–1.24)
GFR, Estimated: 38 mL/min — ABNORMAL LOW (ref >60)
Glucose, Bld: 110 mg/dL — ABNORMAL HIGH (ref 70–99)
Potassium: 4 mmol/L (ref 3.5–5.1)
Sodium: 139 mmol/L (ref 135–145)

## 2021-06-04 LAB — URINALYSIS, MICROSCOPIC (REFLEX)

## 2021-06-04 NOTE — ED Notes (Signed)
Patient and wife verbalize understanding of discharge instructions

## 2021-06-04 NOTE — ED Triage Notes (Addendum)
States he has trouble urinating and burning since last Friday, states he has had no urination since last night at 9pm, states his appetite has decreased, drinks water but 'it won't come out sometimes.' Also is being trt for a UTI, does not know what abx.

## 2021-06-04 NOTE — ED Notes (Signed)
Pt transported to CT via stretcher.  

## 2021-06-04 NOTE — ED Provider Notes (Signed)
[facility] DEPT Provider Note   CSN: [id number] Arrival date & time: 06/04/21  1941     History No chief complaint on file.   [name] [name] is a 87 y.o. male.  Week ago, [name] [name] began having difficulty urinating.  He would try to urinate, and he was just dribbling small amounts.  4 days ago, he went to his primary care physician, and they prescribed an antibiotic.  He states that since 3 AM he has not urinated, and he is uncomfortable.  He did have some nausea which was related to something he was given to stimulate urination.  The history is provided by the patient.  Dysuria Presenting symptoms: dysuria   Context: spontaneously   Relieved by:  Nothing Worsened by:  Urination Ineffective treatments:  Prescription drugs Associated symptoms: nausea and urinary retention   Associated symptoms: no abdominal pain, no diarrhea, no fever, no hematuria and no vomiting       Past Medical History:  Diagnosis Date   Abdominal mass 02/2013   Acute respiratory failure with hypoxia ([place]) 09/12/2014   CAD (coronary artery disease)    CKD (chronic kidney disease), stage III (HCC)    GIST (gastrointestinal stroma tumor), malignant, colon ([place])    [name] 03/13/2017   Hemorrhagic shock ([place]) 09/13/2014   Hyperlipidemia    TAKES zOCOR DAILY   Hypertension    TAKES LOTREL,HCTZ,AND METOPROLOL DAILY   Intraperitoneal hemorrhage 09/17/2014   NSTEMI (non-ST elevated myocardial infarction) ([place]) 02/2013   "LIGHT: ONE MD SAID HE DID AND ONE SAID HE DIDN'T   Pneumonia    MAR 2014   Shortness of breath    Stromal tumor of digestive system ([place])    UTI (urinary tract infection)     Patient Active Problem List   Diagnosis Date Noted   Demand ischemia ([place])    Cholecystitis 09/26/2020   Partial small bowel obstruction ([place]) 04/03/2017   Abscess of abdominal cavity ([place])    Ascites    Sepsis ([place]) 07/26/2015   Abdominal abscess    C. difficile diarrhea 05/10/2015    Urinary retention 05/07/2015   CKD (chronic kidney disease), stage III (HCC)    SIRS (systemic inflammatory response syndrome) ([place]) 05/06/2015   Epigastric pain    Abdominal pain 01/13/2015   Abdominal carcinomatosis ([place]) 74/07/1447   Diastolic CHF, acute on chronic ([place]) 09/17/2014   Hyperlipidemia    CAD in native artery    Hypertension    GIST (gastrointestinal stromal tumor) of small bowel, malignant ([place]) 06/20/2013   Acute kidney failure ([place]) 02/28/2013   NSTEMI (non-ST elevated myocardial infarction) ([place]) 02/27/2013   Chronic anemia 02/27/2013    Past Surgical History:  Procedure Laterality Date   CARDIAC CATHETERIZATION  03-05-13   COLONOSCOPY  09/08/2014   dr scooler   CYST EXCISION  1962   wrist   IR GENERIC HISTORICAL  05/17/2016   IR RADIOLOGIST EVAL & MGMT 05/17/2016 [name], MD [facility] INTERV RAD   IR GENERIC HISTORICAL  07/26/2016   IR RADIOLOGIST EVAL & MGMT 07/26/2016 [name], MD [facility] INTERV RAD   LAPAROTOMY N/A 06/04/2013   Procedure: EXPLORATORY LAPAROTOMY, OPEN RESECTION OF MESENTERIC AND INTESTINAL MASS;  Surgeon: [name], MD;  Location: [facility];  Service: General;  Laterality: N/A;  Small Bowel Resection   LEFT HEART CATHETERIZATION WITH CORONARY ANGIOGRAM N/A 03/05/2013   Procedure: LEFT HEART CATHETERIZATION WITH CORONARY ANGIOGRAM;  Surgeon: [name], MD;  Location: [facility] CATH LAB;  Service: Cardiovascular;  Laterality: N/A;       Family History  Problem Relation Age of Onset   Stroke Mother    Skin cancer Father    Skin cancer Brother     Social History   Tobacco Use   Smoking status: Never   Smokeless tobacco: Never  Vaping Use   Vaping Use: Never used  Substance Use Topics   Alcohol use: No   Drug use: No    Home Medications Prior to Admission medications   Medication Sig Start Date End Date Taking? Authorizing Provider  amLODipine (NORVASC) 5 MG tablet Take 5 mg by mouth daily. 03/17/15   [provider]  furosemide (LASIX) 20 MG tablet Take 1 tablet (20 mg total) by mouth daily. 05/20/15   Hosie Poisson, MD  GLEEVEC 400 MG tablet TAKE 1 TABLET (400 MG TOTAL) BY MOUTH DAILY. TAKE WITH MEAL AND LARGE GLASS OF WATER. 06/19/20 06/19/21  Ladell Pier, MD  metoprolol tartrate (LOPRESSOR) 25 MG tablet Take 1 tablet (25 mg total) by mouth 2 (two) times daily. 03/06/13   Caren Griffins, MD  potassium chloride SA (KLOR-CON) 20 MEQ tablet Take 1 tablet (20 mEq total) by mouth daily. 02/04/21   Ladell Pier, MD  simvastatin (ZOCOR) 20 MG tablet Take 20 mg by mouth daily. 10/21/20   [provider]  tamsulosin (FLOMAX) 0.4 MG CAPS capsule Take 0.4 mg by mouth daily. 02/04/20   [provider]    Allergies    Patient has no known allergies.  Review of Systems   Review of Systems  Constitutional:  Negative for chills and fever.  HENT:  Negative for ear pain and sore throat.   Eyes:  Negative for pain and visual disturbance.  Respiratory:  Negative for cough and shortness of breath.   Cardiovascular:  Negative for chest pain and palpitations.  Gastrointestinal:  Positive for constipation and nausea. Negative for abdominal pain, diarrhea and vomiting.  Genitourinary:  Positive for dysuria. Negative for hematuria.  Musculoskeletal:  Negative for arthralgias and back pain.  Skin:  Negative for color change and rash.  Neurological:  Negative for seizures and syncope.  All other systems reviewed and are negative.  Physical Exam Updated Vital Signs BP (!) 155/58 (BP Location: Left Arm)   Pulse 84   Temp 97.8 F (36.6 C) (Oral)   Resp 18   Ht 5\' 7"  (1.702 m)   Wt 74.8 kg   SpO2 99%   BMI 25.84 kg/m   Physical Exam Vitals and nursing note reviewed.  Constitutional:      Appearance: Normal appearance.  HENT:     Head: Normocephalic and atraumatic.  Eyes:     Conjunctiva/sclera: Conjunctivae normal.  Pulmonary:     Effort: Pulmonary effort is normal. No respiratory  distress.  Abdominal:     General: There is no distension.     Tenderness: There is no abdominal tenderness. There is no right CVA tenderness, left CVA tenderness or guarding.  Musculoskeletal:        General: No deformity. Normal range of motion.     Cervical back: Normal range of motion.  Skin:    General: Skin is warm and dry.  Neurological:     General: No focal deficit present.     Mental Status: He is alert and oriented to person, place, and time. Mental status is at baseline.  Psychiatric:        Mood and Affect: Mood normal.  ED Results / Procedures / Treatments   Labs (all labs ordered are listed, but only abnormal results are displayed) Labs Reviewed  CBC WITH DIFFERENTIAL/PLATELET - Abnormal; Notable for the following components:      Result Value   WBC 3.8 (*)    RBC 2.74 (*)    Hemoglobin 9.9 (*)    HCT 30.1 (*)    MCV 109.9 (*)    MCH 36.1 (*)    Lymphs Abs 0.6 (*)    All other components within normal limits  BASIC METABOLIC PANEL - Abnormal; Notable for the following components:   Glucose, Bld 110 (*)    BUN 24 (*)    Creatinine, Ser 1.70 (*)    Calcium 8.6 (*)    GFR, Estimated 38 (*)    All other components within normal limits  URINALYSIS, ROUTINE W REFLEX MICROSCOPIC - Abnormal; Notable for the following components:   APPearance HAZY (*)    Hgb urine dipstick MODERATE (*)    Protein, ur 30 (*)    All other components within normal limits  URINALYSIS, MICROSCOPIC (REFLEX) - Abnormal; Notable for the following components:   Bacteria, UA RARE (*)    All other components within normal limits    EKG None  Radiology CT Renal Stone Study  Result Date: 06/04/2021 CLINICAL DATA:  87 year old male with trouble urinating for 6 days. Decreased appetite. EXAM: CT ABDOMEN AND PELVIS WITHOUT CONTRAST TECHNIQUE: Multidetector CT imaging of the abdomen and pelvis was performed following the standard protocol without IV contrast. COMPARISON:  CT Abdomen and  Pelvis 09/26/2020 and earlier. FINDINGS: Lower chest: Stable borderline to mild cardiomegaly. Lung bases are stable and negative. Hepatobiliary: Chronic gallstones (16 mm on series 2, image 23) and there are clustered small stones within the distal common bile duct (series 4, images 63 and 64 and series 2, image 27. Each of these is roughly 8-9 mm. But no acute ductal dilatation. Negative noncontrast liver parenchyma. Pancreas: Negative. Spleen: Negative. Adrenals/Urinary Tract: Normal adrenal glands. Nonobstructed kidneys with stable appearing chronic nonspecific bilateral perinephric space stranding. No nephrolithiasis. Both ureters are decompressed to the bladder and normal. The bladder volume is similar to prior exams, 260 mL. No perivesical stranding. No urinary calculus identified. Stomach/Bowel: Mild retained stool in the distal colon. Extensive diverticulosis at the junction of the descending and sigmoid colon, no active inflammation. Cecum is within normal limits. Decompressed and negative terminal ileum. Appendix not delineated. Upstream of a chronic right inguinal hernia containing distal small bowel loops is a chronic small bowel anastomosis with no adverse features (series 2, image 48). No dilated or inflamed small bowel loops. Decompressed stomach and duodenum. No free air or free fluid. Vascular/Lymphatic: Extensive Aortoiliac calcified atherosclerosis. Normal caliber abdominal aorta. Vascular patency is not evaluated in the absence of IV contrast. Reproductive: Chronic bowel and mesentery containing right inguinal hernia is at least 10 cm in length and contains distal small bowel as before. No active inflammation. Small fat containing left inguinal hernia is stable. Other: No pelvic free fluid. Musculoskeletal: No acute osseous abnormality identified. Mild for age lumbar spine degeneration. IMPRESSION: 1. Positive for choledocholithiasis with clustered 8-9 mm stones in the distal common bile duct.  But no acute ductal dilatation. Chronic cholelithiasis. 2. No other acute or inflammatory process identified in the non-contrast abdomen or pelvis. No urinary calculus or obstructive uropathy. Estimated bladder volume of 260 mL is similar to prior CTs. 3. Relatively large chronic right inguinal hernia containing distal small bowel  and mesentery with no evidence of incarceration or obstruction at this time. Diverticulosis of the distal colon without active inflammation. 4. Aortic Atherosclerosis (ICD10-I70.0). Electronically Signed   By: Genevie Ann M.D.   On: 06/04/2021 09:45    Procedures Procedures   Medications Ordered in ED Medications - No data to display  ED Course  I have reviewed the triage vital signs and the nursing notes.  Pertinent labs & imaging results that were available during my care of the patient were reviewed by me and considered in my medical decision making (see chart for details).    MDM Rules/Calculators/A&P                          Beau Fanny presented with one week of urinary retention and dysuria.  He is on Bactrim for a possible UTI.  He was evaluated here for evidence of kidney stone, urinary tract infection, pyelonephritis.  He had 260 cc of urine in his bladder which was drained via straight cath.  The patient declined Foley placement.  He understands that he may continue to retain urine and that this is likely secondary to prostatic hypertrophy.  I did evaluate for evidence of acute kidney injury.  BUN is slightly higher than his previous, but his creatinine is normal.  Overall, his renal function is very similar to his priors.  He is also anemic, and this is likewise consistent with his baseline.  I did ask him to see urology in follow-up.  He will continue taking tamsulosin as prescribed by his primary care provider.  He was given very careful return precautions. Final Clinical Impression(s) / ED Diagnoses Final diagnoses:  Urinary retention  Asymptomatic  microscopic hematuria    Rx / DC Orders ED Discharge Orders     None        Arnaldo Natal, MD 06/04/21 1229

## 2021-06-04 NOTE — ED Notes (Signed)
Straight cathed w/ grace, NT. 450 mL or urine obtained. Specimen sent to lab.

## 2021-06-04 NOTE — ED Notes (Signed)
Bladder scan showed about 160 mL, repeated x3 for accuracy.

## 2021-06-08 ENCOUNTER — Emergency Department (HOSPITAL_COMMUNITY)
Admission: EM | Admit: 2021-06-08 | Discharge: 2021-06-08 | Disposition: A | Discharge status: Home / Self Care | Payer: Medicare Other | Attending: Emergency Medicine | Admitting: Emergency Medicine

## 2021-06-08 DIAGNOSIS — I2510 Atherosclerotic heart disease of native coronary artery without angina pectoris: Secondary | ICD-10-CM | POA: Insufficient documentation

## 2021-06-08 DIAGNOSIS — K469 Unspecified abdominal hernia without obstruction or gangrene: Secondary | ICD-10-CM | POA: Insufficient documentation

## 2021-06-08 DIAGNOSIS — Z79899 Other long term (current) drug therapy: Secondary | ICD-10-CM | POA: Insufficient documentation

## 2021-06-08 DIAGNOSIS — N183 Chronic kidney disease, stage 3 unspecified: Secondary | ICD-10-CM | POA: Diagnosis not present

## 2021-06-08 DIAGNOSIS — R531 Weakness: Secondary | ICD-10-CM | POA: Diagnosis not present

## 2021-06-08 DIAGNOSIS — I5033 Acute on chronic diastolic (congestive) heart failure: Secondary | ICD-10-CM | POA: Diagnosis not present

## 2021-06-08 DIAGNOSIS — I130 Hypertensive heart and chronic kidney disease with heart failure and stage 1 through stage 4 chronic kidney disease, or unspecified chronic kidney disease: Secondary | ICD-10-CM | POA: Diagnosis not present

## 2021-06-08 DIAGNOSIS — R5381 Other malaise: Secondary | ICD-10-CM | POA: Diagnosis not present

## 2021-06-08 DIAGNOSIS — R339 Retention of urine, unspecified: Secondary | ICD-10-CM | POA: Insufficient documentation

## 2021-06-08 LAB — BASIC METABOLIC PANEL
Anion gap: 7 (ref 5–15)
BUN: 26 mg/dL — ABNORMAL HIGH (ref 8–23)
CO2: 24 mmol/L (ref 22–32)
Calcium: 9 mg/dL (ref 8.9–10.3)
Chloride: 105 mmol/L (ref 98–111)
Creatinine, Ser: 1.97 mg/dL — ABNORMAL HIGH (ref 0.61–1.24)
GFR, Estimated: 32 mL/min — ABNORMAL LOW (ref >60)
Glucose, Bld: 109 mg/dL — ABNORMAL HIGH (ref 70–99)
Potassium: 4.7 mmol/L (ref 3.5–5.1)
Sodium: 136 mmol/L (ref 135–145)

## 2021-06-08 LAB — CBC WITH DIFFERENTIAL/PLATELET
Abs Immature Granulocytes: 0.01 10*3/uL (ref 0.00–0.07)
Basophils Absolute: 0 10*3/uL (ref 0.0–0.1)
Basophils Relative: 0 %
Eosinophils Absolute: 0.1 10*3/uL (ref 0.0–0.5)
Eosinophils Relative: 3 %
HCT: 29.4 % — ABNORMAL LOW (ref 39.0–52.0)
Hemoglobin: 9.8 g/dL — ABNORMAL LOW (ref 13.0–17.0)
Immature Granulocytes: 0 %
Lymphocytes Relative: 21 %
Lymphs Abs: 0.8 10*3/uL (ref 0.7–4.0)
MCH: 36.3 pg — ABNORMAL HIGH (ref 26.0–34.0)
MCHC: 33.3 g/dL (ref 30.0–36.0)
MCV: 108.9 fL — ABNORMAL HIGH (ref 80.0–100.0)
Monocytes Absolute: 0.4 10*3/uL (ref 0.1–1.0)
Monocytes Relative: 10 %
Neutro Abs: 2.4 10*3/uL (ref 1.7–7.7)
Neutrophils Relative %: 66 %
Platelets: 184 10*3/uL (ref 150–400)
RBC: 2.7 MIL/uL — ABNORMAL LOW (ref 4.22–5.81)
RDW: 13.2 % (ref 11.5–15.5)
WBC: 3.6 10*3/uL — ABNORMAL LOW (ref 4.0–10.5)
nRBC: 0 % (ref 0.0–0.2)

## 2021-06-08 LAB — URINALYSIS, ROUTINE W REFLEX MICROSCOPIC
Bilirubin Urine: NEGATIVE
Glucose, UA: NEGATIVE mg/dL
Hgb urine dipstick: NEGATIVE
Ketones, ur: NEGATIVE mg/dL
Leukocytes,Ua: NEGATIVE
Nitrite: NEGATIVE
Protein, ur: NEGATIVE mg/dL
Specific Gravity, Urine: 1.015 (ref 1.005–1.030)
pH: 5 (ref 5.0–8.0)

## 2021-06-08 NOTE — ED Triage Notes (Signed)
Pt complains of abd pain, back pain and urinary retention since 9 pm last night

## 2021-06-08 NOTE — ED Provider Notes (Signed)
[facility] DEPT Provider Note   CSN: [id number] Arrival date & time: 06/08/21  0636     History No chief complaint on file.   [name] [name] is a 87 y.o. male.  Patient is a 87 year old male with a history of CKD, GIST tumor, CAD, inguinal hernia, hypertension who is presenting today complaining of inability to urinate.  Patient symptoms have been going on now for approximately 8 days.  He states 1 morning he just woke up and he was having difficulty urinating.  He would go multiple times and have to wait a long period of time and would only get dribbling out.  He then would stand there for prolonged period of time and finally urine would shoot out.  It is been progressive he saw his doctor 6 days ago and he thought he may have a urinary tract infection and had been placed on antibiotics.  However this did not improve his symptoms and 4 days prior to arrival he was seen in the emergency room.  At that time he had a straight cath with 220 mL removed.  He had mild increase in his creatinine but negative CT and urine.  He went home without a catheter but states since that time he has continued to have issues with urinating.  He has frequency, urgency and dysuria.  Now he has been unable to urinate since 9 PM last night.  He has been up regularly and still not able to get any urine out.  He is having extreme discomfort in his lower abdomen but denies any back pain, fever, nausea or vomiting.  He has not noticed any blood in his urine.  The history is provided by the patient.      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[place])    [name] 03/13/2017   Hemorrhagic shock ([place]) 09/13/2014   Hyperlipidemia    TAKES zOCOR DAILY   Hypertension    TAKES  LOTREL,HCTZ,AND METOPROLOL DAILY   Intraperitoneal hemorrhage 09/17/2014   NSTEMI (non-ST elevated myocardial infarction) ([place]) 02/2013   "LIGHT: ONE MD SAID HE DID AND ONE SAID HE DIDN'T   Pneumonia    MAR 2014   Shortness of breath    Stromal tumor of digestive system ([place])    UTI (urinary tract infection)     Patient Active Problem List   Diagnosis Date Noted   Demand ischemia ([place])    Cholecystitis 09/26/2020   Partial small bowel obstruction ([place]) 04/03/2017   Abscess of abdominal cavity ([place])    Ascites    Sepsis ([place]) 07/26/2015   Abdominal abscess    C. difficile diarrhea 05/10/2015   Urinary retention 05/07/2015   CKD (chronic kidney disease), stage III (HCC)    SIRS (systemic inflammatory response syndrome) ([place]) 05/06/2015   Epigastric pain    Abdominal pain 01/13/2015   Abdominal carcinomatosis ([place]) 62/13/0865   Diastolic CHF, acute on chronic ([place]) 09/17/2014   Hyperlipidemia    CAD in native artery    Hypertension    GIST (gastrointestinal stromal tumor) of small bowel, malignant ([place]) 06/20/2013   Acute kidney failure ([place]) 02/28/2013   NSTEMI (non-ST elevated myocardial infarction) ([place]) 02/27/2013   Chronic anemia 02/27/2013    Past Surgical History:  Procedure Laterality Date  CARDIAC CATHETERIZATION  03-05-13   COLONOSCOPY  09/08/2014   dr scooler   CYST EXCISION  1962   wrist   IR GENERIC HISTORICAL  05/17/2016   IR RADIOLOGIST EVAL & MGMT 05/17/2016 [name], MD [facility] INTERV RAD   IR GENERIC HISTORICAL  07/26/2016   IR RADIOLOGIST EVAL & MGMT 07/26/2016 [name], MD [facility] INTERV RAD   LAPAROTOMY N/A 06/04/2013   Procedure: EXPLORATORY LAPAROTOMY, OPEN RESECTION OF MESENTERIC AND INTESTINAL MASS;  Surgeon: [name], MD;  Location: [facility];  Service: General;  Laterality: N/A;  Small Bowel Resection   LEFT HEART CATHETERIZATION WITH CORONARY ANGIOGRAM N/A 03/05/2013   Procedure: LEFT HEART CATHETERIZATION WITH CORONARY ANGIOGRAM;   Surgeon: [name], MD;  Location: [facility] CATH LAB;  Service: Cardiovascular;  Laterality: N/A;       Family History  Problem Relation Age of Onset   Stroke Mother    Skin cancer Father    Skin cancer Brother     Social History   Tobacco Use   Smoking status: Never   Smokeless tobacco: Never  Vaping Use   Vaping Use: Never used  Substance Use Topics   Alcohol use: No   Drug use: No    Home Medications Prior to Admission medications   Medication Sig Start Date End Date Taking? Authorizing Provider  amLODipine (NORVASC) 5 MG tablet Take 5 mg by mouth daily. 03/17/15   [provider]  furosemide (LASIX) 20 MG tablet Take 1 tablet (20 mg total) by mouth daily. 05/20/15   Hosie Poisson, MD  GLEEVEC 400 MG tablet TAKE 1 TABLET (400 MG TOTAL) BY MOUTH DAILY. TAKE WITH MEAL AND LARGE GLASS OF WATER. Patient taking differently: Take 400 mg by mouth daily. 06/19/20 06/19/21  Ladell Pier, MD  metoprolol tartrate (LOPRESSOR) 25 MG tablet Take 1 tablet (25 mg total) by mouth 2 (two) times daily. 03/06/13   Caren Griffins, MD  potassium chloride SA (KLOR-CON) 20 MEQ tablet Take 1 tablet (20 mEq total) by mouth daily. 02/04/21   Ladell Pier, MD  simvastatin (ZOCOR) 20 MG tablet Take 20 mg by mouth daily. 10/21/20   [provider]  sulfamethoxazole-trimethoprim (BACTRIM DS) 800-160 MG tablet Take 1 tablet by mouth 2 (two) times daily. 06/02/21   [provider]  tamsulosin (FLOMAX) 0.4 MG CAPS capsule Take 0.4 mg by mouth daily. 02/04/20   [provider]    Allergies    Patient has no known allergies.  Review of Systems   Review of Systems  All other systems reviewed and are negative.  Physical Exam Updated Vital Signs BP 132/71 (BP Location: Right Arm)   Pulse 75   Temp (!) 97.5 F (36.4 C) (Oral)   Resp 18   Ht 5\' 7"  (1.702 m)   Wt 73 kg   SpO2 99%   BMI 25.22 kg/m   Physical Exam Vitals and nursing note reviewed.  Constitutional:       General: He is not in acute distress.    Appearance: Normal appearance. He is well-developed and normal weight.     Comments: Appears younger than stated age  HENT:     Head: Normocephalic and atraumatic.  Eyes:     Conjunctiva/sclera: Conjunctivae normal.     Pupils: Pupils are equal, round, and reactive to light.  Cardiovascular:     Rate and Rhythm: Normal rate and regular rhythm.     Pulses: Normal pulses.     Heart  sounds: No murmur heard. Pulmonary:     Effort: Pulmonary effort is normal. No respiratory distress.     Breath sounds: Normal breath sounds. No wheezing or rales.  Abdominal:     General: There is no distension.     Palpations: Abdomen is soft.     Tenderness: There is abdominal tenderness in the suprapubic area. There is no right CVA tenderness, left CVA tenderness, guarding or rebound.     Hernia: A hernia is present.     Comments: Right inguinal hernia that is nontender, soft and partially reducible.  Musculoskeletal:        General: No tenderness. Normal range of motion.     Cervical back: Normal range of motion and neck supple.     Right lower leg: No edema.     Left lower leg: No edema.  Skin:    General: Skin is warm and dry.     Findings: No erythema or rash.  Neurological:     Mental Status: He is alert and oriented to person, place, and time. Mental status is at baseline.  Psychiatric:        Mood and Affect: Mood normal.        Behavior: Behavior normal.    ED Results / Procedures / Treatments   Labs (all labs ordered are listed, but only abnormal results are displayed) Labs Reviewed  CBC WITH DIFFERENTIAL/PLATELET  BASIC METABOLIC PANEL  URINALYSIS, ROUTINE W REFLEX MICROSCOPIC    EKG None  Radiology No results found.  Procedures Procedures   Medications Ordered in ED Medications - No data to display  ED Course  I have reviewed the triage vital signs and the nursing notes.  Pertinent labs & imaging results that were  available during my care of the patient were reviewed by me and considered in my medical decision making (see chart for details).    MDM Rules/Calculators/A&P                          Patient presenting today with symptoms of urinary retention.  This has been ongoing now for 8 days.  He has completed a course of antibiotics but there is been no specific findings for UTI.  Unclear if PCP had urine cultures done but there were none done when he was seen in the ER 4 days ago.  CT 4 days ago was negative for any obstructing lesion.  Patient was found to have a right inguinal hernia with bowel contents but no incarceration.  Patient continues to complain of dysuria, frequency and urgency.  He is having lower abdominal discomfort.  Bladder scan shows 230 mL present however will place Foley and drain the bladder.  We will recheck labs.  Once patient is in an exam room we will also ensure he is not having pain from his inguinal hernia.  Vital signs are reassuring  9:24 AM Patient's urine today is within normal limits, CBC with persistent leukopenia with a count of 3.6 which is unchanged from 4 days ago.  Hemoglobin is stable at 9.8.  BMP with increasing creatinine now 1.97 from 1.7 a few days ago.  Feel that this is most likely related to his urinary issues and also he reports drinking less because he did not want to pee.  Patient's pain completely resolved after Foley catheter placement with 400 MLS out.  We will leave the catheter as this is patient's second visit to the emergency room due to  urinary retention.  We will have him call urology for follow-up appointment in 1 week for catheter removal.  Also encourage patient to increase oral intake.  He will see Dr. Doyle Askew later this week for repeat creatinine.  He was given strict return precautions.  Patient had been given Flomax at his last visit however he stopped taking it because he was having adverse side effects.  MDM   Amount and/or Complexity of Data  Reviewed Clinical lab tests: ordered and reviewed Independent visualization of images, tracings, or specimens: yes     Final Clinical Impression(s) / ED Diagnoses Final diagnoses:  Urinary retention    Rx / DC Orders ED Discharge Orders     None        Blanchie Dessert, MD 06/08/21 [phone]

## 2021-06-08 NOTE — Discharge Instructions (Addendum)
Today there is no sign of infection in your urine.  However your kidney function has continued to get a little bit worse.  This might just be because of the inability to urinate but also you need to make sure you are drinking plenty of fluid.  If you start running a fever or having vomiting or worsening pain even with the catheter you should return to the emergency room.  Your doctor needs to check your kidney function later this week or early next week and the catheter needs to be taken out next week.

## 2021-06-10 DIAGNOSIS — G4701 Insomnia due to medical condition: Secondary | ICD-10-CM | POA: Diagnosis not present

## 2021-06-10 DIAGNOSIS — R339 Retention of urine, unspecified: Secondary | ICD-10-CM | POA: Diagnosis not present

## 2021-06-17 ENCOUNTER — Other Ambulatory Visit: Payer: Self-pay | Admitting: *Deleted

## 2021-06-17 DIAGNOSIS — C49A3 Gastrointestinal stromal tumor of small intestine: Secondary | ICD-10-CM

## 2021-06-17 MED ORDER — GLEEVEC 400 MG PO TABS: ORAL_TABLET | ORAL | 5 refills | Status: DC

## 2021-06-18 DIAGNOSIS — N289 Disorder of kidney and ureter, unspecified: Secondary | ICD-10-CM | POA: Diagnosis not present

## 2021-06-24 DIAGNOSIS — R338 Other retention of urine: Secondary | ICD-10-CM | POA: Diagnosis not present

## 2021-07-01 DIAGNOSIS — R338 Other retention of urine: Secondary | ICD-10-CM | POA: Diagnosis not present

## 2021-07-08 DIAGNOSIS — H6122 Impacted cerumen, left ear: Secondary | ICD-10-CM | POA: Diagnosis not present

## 2021-07-08 DIAGNOSIS — H40013 Open angle with borderline findings, low risk, bilateral: Secondary | ICD-10-CM | POA: Diagnosis not present

## 2021-07-08 DIAGNOSIS — Z961 Presence of intraocular lens: Secondary | ICD-10-CM | POA: Diagnosis not present

## 2021-07-08 DIAGNOSIS — I10 Essential (primary) hypertension: Secondary | ICD-10-CM | POA: Diagnosis not present

## 2021-08-04 ENCOUNTER — Inpatient Hospital Stay (HOSPITAL_BASED_OUTPATIENT_CLINIC_OR_DEPARTMENT_OTHER): Payer: Medicare Other | Admitting: Oncology

## 2021-08-04 ENCOUNTER — Other Ambulatory Visit: Payer: Self-pay

## 2021-08-04 ENCOUNTER — Inpatient Hospital Stay: Payer: Medicare Other | Attending: Nurse Practitioner

## 2021-08-04 VITALS — BP 160/61 | HR 81 | Temp 98.0°F | Resp 20 | Ht 67.0 in | Wt 160.2 lb

## 2021-08-04 DIAGNOSIS — Z79899 Other long term (current) drug therapy: Secondary | ICD-10-CM | POA: Insufficient documentation

## 2021-08-04 DIAGNOSIS — N289 Disorder of kidney and ureter, unspecified: Secondary | ICD-10-CM | POA: Insufficient documentation

## 2021-08-04 DIAGNOSIS — C786 Secondary malignant neoplasm of retroperitoneum and peritoneum: Secondary | ICD-10-CM | POA: Diagnosis not present

## 2021-08-04 DIAGNOSIS — C49A3 Gastrointestinal stromal tumor of small intestine: Secondary | ICD-10-CM

## 2021-08-04 LAB — CMP (CANCER CENTER ONLY)
ALT: 13 U/L (ref 0–44)
AST: 19 U/L (ref 15–41)
Albumin: 3.7 g/dL (ref 3.5–5.0)
Alkaline Phosphatase: 41 U/L (ref 38–126)
Anion gap: 8 (ref 5–15)
BUN: 25 mg/dL — ABNORMAL HIGH (ref 8–23)
CO2: 24 mmol/L (ref 22–32)
Calcium: 8.4 mg/dL — ABNORMAL LOW (ref 8.9–10.3)
Chloride: 112 mmol/L — ABNORMAL HIGH (ref 98–111)
Creatinine: 1.68 mg/dL — ABNORMAL HIGH (ref 0.61–1.24)
GFR, Estimated: 38 mL/min — ABNORMAL LOW (ref >60)
Glucose, Bld: 124 mg/dL — ABNORMAL HIGH (ref 70–99)
Potassium: 4.7 mmol/L (ref 3.5–5.1)
Sodium: 144 mmol/L (ref 135–145)
Total Bilirubin: 0.4 mg/dL (ref 0.3–1.2)
Total Protein: 5.9 g/dL — ABNORMAL LOW (ref 6.5–8.1)

## 2021-08-04 LAB — CBC WITH DIFFERENTIAL (CANCER CENTER ONLY)
Abs Immature Granulocytes: 0 10*3/uL (ref 0.00–0.07)
Basophils Absolute: 0 10*3/uL (ref 0.0–0.1)
Basophils Relative: 0 %
Eosinophils Absolute: 0.1 10*3/uL (ref 0.0–0.5)
Eosinophils Relative: 2 %
HCT: 28.4 % — ABNORMAL LOW (ref 39.0–52.0)
Hemoglobin: 9.4 g/dL — ABNORMAL LOW (ref 13.0–17.0)
Immature Granulocytes: 0 %
Lymphocytes Relative: 20 %
Lymphs Abs: 1 10*3/uL (ref 0.7–4.0)
MCH: 36.3 pg — ABNORMAL HIGH (ref 26.0–34.0)
MCHC: 33.1 g/dL (ref 30.0–36.0)
MCV: 109.7 fL — ABNORMAL HIGH (ref 80.0–100.0)
Monocytes Absolute: 0.5 10*3/uL (ref 0.1–1.0)
Monocytes Relative: 10 %
Neutro Abs: 3.5 10*3/uL (ref 1.7–7.7)
Neutrophils Relative %: 68 %
Platelet Count: 168 10*3/uL (ref 150–400)
RBC: 2.59 MIL/uL — ABNORMAL LOW (ref 4.22–5.81)
RDW: 13.4 % (ref 11.5–15.5)
WBC Count: 5 10*3/uL (ref 4.0–10.5)
nRBC: 0 % (ref 0.0–0.2)

## 2021-08-04 MED ORDER — POTASSIUM CHLORIDE CRYS ER 20 MEQ PO TBCR: 20.0000 meq | EXTENDED_RELEASE_TABLET | Freq: Every day | ORAL | 5 refills | Status: DC

## 2021-08-04 NOTE — Addendum Note (Signed)
Addended by: Lenox Ponds E on: 08/04/2021 03:05 PM   Modules accepted: Orders

## 2021-08-04 NOTE — Progress Notes (Signed)
[facility] OFFICE PROGRESS NOTE   Diagnosis: Gastrointestinal stromal tumor  INTERVAL HISTORY:   [name] returns as scheduled.  He continues [place].  He feels well at present.  He was seen in the emergency room in June with urinary retention.  A Foley catheter was placed.  A CT renal stone study on 06/04/2021 revealed chronic gallstones.  No kidney stones.  Chronic right inguinal hernia.  No free fluid.  Small fat-containing left inguinal hernia. He saw urology.  The Foley catheter has been removed.  He was started on Flomax.  He is now urinating without difficulty.  Objective:  Vital signs in last 24 hours:  Blood pressure (!) 160/61, pulse 81, temperature 98 F (36.7 C), temperature source Oral, resp. rate 20, height 5' 7"  (1.702 m), weight 160 lb 3.2 oz (72.7 kg), SpO2 100 %.    HEENT: No thrush or ulcers Resp: Lungs clear bilaterally Cardio: Regular rate and rhythm GI: No hepatosplenomegaly, no apparent ascites, right inguinal hernia, left inguinal hernia? Vascular: No leg edema   Lab Results:  Lab Results  Component Value Date   WBC 5.0 08/04/2021   HGB 9.4 (L) 08/04/2021   HCT 28.4 (L) 08/04/2021   MCV 109.7 (H) 08/04/2021   PLT 168 08/04/2021   NEUTROABS 3.5 08/04/2021    CMP  Lab Results  Component Value Date   NA 136 06/08/2021   K 4.7 06/08/2021   CL 105 06/08/2021   CO2 24 06/08/2021   GLUCOSE 109 (H) 06/08/2021   BUN 26 (H) 06/08/2021   CREATININE 1.97 (H) 06/08/2021   CALCIUM 9.0 06/08/2021   PROT 6.2 (L) 05/04/2021   ALBUMIN 3.9 05/04/2021   AST 23 05/04/2021   ALT 15 05/04/2021   ALKPHOS 45 05/04/2021   BILITOT 0.4 05/04/2021   GFRNONAA 32 (L) 06/08/2021   GFRAA 51 (L) 08/05/2020    Medications: I have reviewed the patient's current medications.   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 CT 10/16/2014 consistent with extensive carcinomatosis, CT-guided biopsy  of an omental mass 10/21/2014 confirmed a gastrointestinal stromal tumor Initiation of Gleevec 10/31/2014 CT 01/13/2015 revealed improvement in the peritoneal and omental metastatic disease CT 05/05/2015 with no progression of omental/peritoneal metastatic disease, increased ascites, and appendix inflammation CT 07/07/2016-no abscess, mild perihepatic and left pericolic ascites and mesenteric edema CT of the pelvis 09/20/2016, no residual abscess, no ascites CT abdomen pelvis renal stones study 04/03/2017-partial small bowel obstruction, potentially related to right inguinal hernia CT abdomen/pelvis 08/18/2017-possible cholecystitis, 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  Cholelithiasis. CT renal stone study 06/04/2021-large right and small left inguinal hernias, no ascites   2. History of anemia, status post a nondiagnostic bone marrow biopsy 10/21/2014   3. NSTEMI March 2014   4. Admission 05/06/2015 with acute onset right abdomen pain-potentially related to acute appendicitis versus pain from carcinomatosis CT 05/13/2015 consistent with a right lower abdomen abscess, status post catheter drainage by interventional radiology 05/13/2015 Follow-up CT 05/19/2015 showed resolution of the abscess. Follow-up evaluation in interventional radiology 06/01/2015 showed the abscess cavity had resolved. The abscess drainage catheter communicated with the cecum via the appendix. The catheter was left in place. CT abdomen/pelvis 06/08/2015  showed the right pelvic drain in place without recurrent or residual surrounding fluid collection. Peritoneal metastasis with  slight increase in small to moderate volume of abdominal pelvic ascites. Drainage catheter injection 06/12/2015 showed residual tiny fistulous connection with the end of the decompressed abscess cavity within the right lower abdominal quadrant and the residual lumen of the appendix. Paracentesis 06/12/2015 with 5 liters of fluid removed Paracentesis 06/19/2015-negative cytology and culture Removal of abscess drainage catheter 06/23/2015 CT 07/26/2015 with increased right abdominal wall inflammation and increased fluid collection at site of previous right abdomen abscess, ascites, and enlargement/inflammation of the appendix Placement of right lower quadrant percutaneous drainage catheter 07/27/2015. Contrast injection confirmed persistent fistulous connection with the ill-defined fluid collection and the residual appendix and cecum. Paracentesis with 2 L of fluid removed. Follow-up CT abdomen/pelvis 08/05/2015 with small to moderate left pleural effusion and moderate to large volume ascites with both appearing slightly decreased since the prior study. Right lower quadrant drainage catheter in place. No residual fluid collection around the drain or in the adjacent abdomen or pelvis. 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 Drainage catheter left in place. 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 [name]     5. C. difficile colitis 05/10/2015, [place] Cranker 2019   6.  Right leg edema 12/18/2015. Negative venous Doppler.   7.  Renal insufficiency  8.  Urinary retention June  2022-temporary Foley catheter placed, started on Flomax     Disposition: [name] appears stable.  There is no clinical evidence for progression of the gastrointestinal stromal tumor.  He will continue Gleevec.  He has chronic renal insufficiency.  The creatinine was higher when he was seen in the emergency room in June..  The creatinine is improved today.  I decrease the potassium chloride to 10 mEq daily.  He will return for an office and lab visit in 3 months.  Betsy Coder, MD  08/04/2021  9:41 AM

## 2021-08-24 DIAGNOSIS — E782 Mixed hyperlipidemia: Secondary | ICD-10-CM | POA: Diagnosis not present

## 2021-08-24 DIAGNOSIS — N183 Chronic kidney disease, stage 3 unspecified: Secondary | ICD-10-CM | POA: Diagnosis not present

## 2021-08-24 DIAGNOSIS — I700 Atherosclerosis of aorta: Secondary | ICD-10-CM | POA: Diagnosis not present

## 2021-08-24 DIAGNOSIS — Z23 Encounter for immunization: Secondary | ICD-10-CM | POA: Diagnosis not present

## 2021-08-24 DIAGNOSIS — I10 Essential (primary) hypertension: Secondary | ICD-10-CM | POA: Diagnosis not present

## 2021-08-24 DIAGNOSIS — C49A3 Gastrointestinal stromal tumor of small intestine: Secondary | ICD-10-CM | POA: Diagnosis not present

## 2021-09-24 ENCOUNTER — Telehealth: Payer: Self-pay

## 2021-09-24 NOTE — Telephone Encounter (Signed)
This nurse attempted to return call x 3 no answer message left to urgently return call concerning this message

## 2021-09-24 NOTE — Telephone Encounter (Signed)
-----   Message from Owens Shark, NP sent at 09/24/2021  8:12 AM EDT ----- Please call him asap ----- Message ----- From: Ladell Pier, MD Sent: 09/23/2021   8:27 PM EDT To: Owens Shark, NP, Kelli Hope, LPN  I just saw this tonight, please call him 10/7 ----- Message ----- From: Hanley Hays, CPhT Sent: 09/23/2021  10:13 AM EDT To: Tania Ade, RN, Ladell Pier, MD  This patient left me a message that he needed to see Dr. [name] asap as he was spitting up blood.  Thanks, Benjamine Mola

## 2021-09-24 NOTE — Telephone Encounter (Signed)
Received incoming call form pt as a f/u to previous message stating he was concerned of coughing of blood this nurse learns pt had an appt with pcp who assessed and felt this was not an emergency when speaking with this pt this nurse learns this was a x1 episode blood was a minute amount no GI upset no SOB no pain or discomfort isolated incident.

## 2021-09-27 ENCOUNTER — Other Ambulatory Visit: Payer: Self-pay

## 2021-09-27 ENCOUNTER — Inpatient Hospital Stay: Payer: Medicare Other | Attending: Nurse Practitioner

## 2021-09-27 ENCOUNTER — Telehealth: Payer: Self-pay

## 2021-09-27 ENCOUNTER — Inpatient Hospital Stay (HOSPITAL_BASED_OUTPATIENT_CLINIC_OR_DEPARTMENT_OTHER): Payer: Medicare Other | Admitting: Oncology

## 2021-09-27 VITALS — BP 138/55 | HR 66 | Temp 98.1°F | Resp 18 | Ht 67.0 in | Wt 159.6 lb

## 2021-09-27 DIAGNOSIS — C49A3 Gastrointestinal stromal tumor of small intestine: Secondary | ICD-10-CM | POA: Insufficient documentation

## 2021-09-27 DIAGNOSIS — N289 Disorder of kidney and ureter, unspecified: Secondary | ICD-10-CM | POA: Diagnosis not present

## 2021-09-27 DIAGNOSIS — D649 Anemia, unspecified: Secondary | ICD-10-CM | POA: Diagnosis not present

## 2021-09-27 DIAGNOSIS — C786 Secondary malignant neoplasm of retroperitoneum and peritoneum: Secondary | ICD-10-CM | POA: Insufficient documentation

## 2021-09-27 DIAGNOSIS — Z79899 Other long term (current) drug therapy: Secondary | ICD-10-CM | POA: Insufficient documentation

## 2021-09-27 DIAGNOSIS — R339 Retention of urine, unspecified: Secondary | ICD-10-CM | POA: Insufficient documentation

## 2021-09-27 LAB — CMP (CANCER CENTER ONLY)
ALT: 11 U/L (ref 0–44)
AST: 19 U/L (ref 15–41)
Albumin: 4 g/dL (ref 3.5–5.0)
Alkaline Phosphatase: 36 U/L — ABNORMAL LOW (ref 38–126)
Anion gap: 6 (ref 5–15)
BUN: 21 mg/dL (ref 8–23)
CO2: 26 mmol/L (ref 22–32)
Calcium: 8.3 mg/dL — ABNORMAL LOW (ref 8.9–10.3)
Chloride: 108 mmol/L (ref 98–111)
Creatinine: 1.67 mg/dL — ABNORMAL HIGH (ref 0.61–1.24)
GFR, Estimated: 39 mL/min — ABNORMAL LOW (ref >60)
Glucose, Bld: 115 mg/dL — ABNORMAL HIGH (ref 70–99)
Potassium: 4.5 mmol/L (ref 3.5–5.1)
Sodium: 140 mmol/L (ref 135–145)
Total Bilirubin: 0.4 mg/dL (ref 0.3–1.2)
Total Protein: 6.2 g/dL — ABNORMAL LOW (ref 6.5–8.1)

## 2021-09-27 LAB — CBC WITH DIFFERENTIAL (CANCER CENTER ONLY)
Abs Immature Granulocytes: 0.01 10*3/uL (ref 0.00–0.07)
Basophils Absolute: 0 10*3/uL (ref 0.0–0.1)
Basophils Relative: 0 %
Eosinophils Absolute: 0.1 10*3/uL (ref 0.0–0.5)
Eosinophils Relative: 3 %
HCT: 28.6 % — ABNORMAL LOW (ref 39.0–52.0)
Hemoglobin: 9.5 g/dL — ABNORMAL LOW (ref 13.0–17.0)
Immature Granulocytes: 0 %
Lymphocytes Relative: 20 %
Lymphs Abs: 0.9 10*3/uL (ref 0.7–4.0)
MCH: 36.3 pg — ABNORMAL HIGH (ref 26.0–34.0)
MCHC: 33.2 g/dL (ref 30.0–36.0)
MCV: 109.2 fL — ABNORMAL HIGH (ref 80.0–100.0)
Monocytes Absolute: 0.4 10*3/uL (ref 0.1–1.0)
Monocytes Relative: 10 %
Neutro Abs: 2.9 10*3/uL (ref 1.7–7.7)
Neutrophils Relative %: 67 %
Platelet Count: 169 10*3/uL (ref 150–400)
RBC: 2.62 MIL/uL — ABNORMAL LOW (ref 4.22–5.81)
RDW: 12.4 % (ref 11.5–15.5)
WBC Count: 4.3 10*3/uL (ref 4.0–10.5)
nRBC: 0 % (ref 0.0–0.2)

## 2021-09-27 NOTE — Progress Notes (Signed)
[facility] OFFICE PROGRESS NOTE   Diagnosis: Gastrointestinal stromal tumor  INTERVAL HISTORY:   [name] [name] returns as scheduled.  He continues [place].  He recently had 2 days of diarrhea.  The right inguinal hernia persist.  The hernia is not painful.  He woke up on the morning of 09/23/2021 with a small amount of blood at the left side of his mouth, outer angle of the lips.  No other bleeding.  He complains of insomnia.  He has difficulty going back to sleep after urinating in the early a.m.  Objective:  Vital signs in last 24 hours:  Blood pressure (!) 138/55, pulse 66, temperature 98.1 F (36.7 C), temperature source Oral, resp. rate 18, height 5' 7"  (1.702 m), weight 159 lb 9.6 oz (72.4 kg), SpO2 99 %.    HEENT: No thrush or ulcers, no active bleeding, several small dilated blood vessels over the tongue and buccal mucosa, mild periorbital edema Resp: Lungs clear bilaterally Cardio: Regular rate and rhythm GI: No mass, no hepatosplenomegaly, right inguinal hernia Vascular: No leg edema, the right lower leg is larger than the left side  Skin: No rash   Lab Results:  Lab Results  Component Value Date   WBC 4.3 09/27/2021   HGB 9.5 (L) 09/27/2021   HCT 28.6 (L) 09/27/2021   MCV 109.2 (H) 09/27/2021   PLT 169 09/27/2021   NEUTROABS 2.9 09/27/2021    CMP  Lab Results  Component Value Date   NA 140 09/27/2021   K 4.5 09/27/2021   CL 108 09/27/2021   CO2 26 09/27/2021   GLUCOSE 115 (H) 09/27/2021   BUN 21 09/27/2021   CREATININE 1.67 (H) 09/27/2021   CALCIUM 8.3 (L) 09/27/2021   PROT 6.2 (L) 09/27/2021   ALBUMIN 4.0 09/27/2021   AST 19 09/27/2021   ALT 11 09/27/2021   ALKPHOS 36 (L) 09/27/2021   BILITOT 0.4 09/27/2021   GFRNONAA 39 (L) 09/27/2021   GFRAA 51 (L) 08/05/2020   ewed the patient's current medications.   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 CT 10/16/2014 consistent  with extensive carcinomatosis, CT-guided biopsy of an omental mass 10/21/2014 confirmed a gastrointestinal stromal tumor Initiation of Gleevec 10/31/2014 CT 01/13/2015 revealed improvement in the peritoneal and omental metastatic disease CT 05/05/2015 with no progression of omental/peritoneal metastatic disease, increased ascites, and appendix inflammation CT 07/07/2016-no abscess, mild perihepatic and left pericolic ascites and mesenteric edema CT of the pelvis 09/20/2016, no residual abscess, no ascites CT abdomen pelvis renal stones study 04/03/2017-partial small bowel obstruction, potentially related to right inguinal hernia CT abdomen/pelvis 08/18/2017-possible cholecystitis, 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  Cholelithiasis. CT renal stone study 06/04/2021-large right and small left inguinal hernias, no ascites   2. History of anemia, status post a nondiagnostic bone marrow biopsy 10/21/2014   3. NSTEMI March 2014   4. Admission 05/06/2015 with acute onset right abdomen pain-potentially related to acute appendicitis versus pain from carcinomatosis CT 05/13/2015 consistent with a right lower abdomen abscess, status post catheter drainage by interventional radiology 05/13/2015 Follow-up CT 05/19/2015 showed resolution of the abscess. Follow-up evaluation in interventional radiology 06/01/2015 showed the abscess cavity had resolved. The abscess drainage catheter  communicated with the cecum via the appendix. The catheter was left in place. CT abdomen/pelvis 06/08/2015 showed the right pelvic drain in place without recurrent or residual surrounding  fluid collection. Peritoneal metastasis with slight increase in small to moderate volume of abdominal pelvic ascites. Drainage catheter injection 06/12/2015 showed residual tiny fistulous connection with the end of the decompressed abscess cavity within the right lower abdominal quadrant and the residual lumen of the appendix. Paracentesis 06/12/2015 with 5 liters of fluid removed Paracentesis 06/19/2015-negative cytology and culture Removal of abscess drainage catheter 06/23/2015 CT 07/26/2015 with increased right abdominal wall inflammation and increased fluid collection at site of previous right abdomen abscess, ascites, and enlargement/inflammation of the appendix Placement of right lower quadrant percutaneous drainage catheter 07/27/2015. Contrast injection confirmed persistent fistulous connection with the ill-defined fluid collection and the residual appendix and cecum. Paracentesis with 2 L of fluid removed. Follow-up CT abdomen/pelvis 08/05/2015 with small to moderate left pleural effusion and moderate to large volume ascites with both appearing slightly decreased since the prior study. Right lower quadrant drainage catheter in place. No residual fluid collection around the drain or in the adjacent abdomen or pelvis. 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 Drainage catheter left in place. 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 [name]     5. C. difficile colitis 05/10/2015, [place] Cranker 2019   6.  Right leg edema 12/18/2015. Negative venous Doppler.   7.  Renal  insufficiency  8.  Urinary retention June 2022-temporary Foley catheter placed, started on Flomax       Disposition: [name] [name] remains in clinical remission from the gastrointestinal stromal tumor.  He will continue Gleevec.  The small amount of blood he noted at the outer mouth last week is likely a benign finding.  He will return for an office and lab visit in 3 months.  Betsy Coder, MD  09/27/2021  11:28 AM

## 2021-09-27 NOTE — Telephone Encounter (Signed)
Pt in today for visit. Inquired about program with [employer] that assists with his payment for [place]. TC to [facility] patient advocate who stated she will follow up with [employer] and give Pt a return call about this matter.

## 2021-10-14 ENCOUNTER — Emergency Department (HOSPITAL_COMMUNITY)
Admission: EM | Admit: 2021-10-14 | Discharge: 2021-10-14 | Disposition: A | Discharge status: Home / Self Care | Payer: Medicare Other | Attending: Emergency Medicine | Admitting: Emergency Medicine

## 2021-10-14 DIAGNOSIS — R600 Localized edema: Secondary | ICD-10-CM | POA: Diagnosis not present

## 2021-10-14 DIAGNOSIS — I5033 Acute on chronic diastolic (congestive) heart failure: Secondary | ICD-10-CM | POA: Insufficient documentation

## 2021-10-14 DIAGNOSIS — W19XXXA Unspecified fall, initial encounter: Secondary | ICD-10-CM | POA: Diagnosis not present

## 2021-10-14 DIAGNOSIS — I2510 Atherosclerotic heart disease of native coronary artery without angina pectoris: Secondary | ICD-10-CM | POA: Diagnosis not present

## 2021-10-14 DIAGNOSIS — Z79899 Other long term (current) drug therapy: Secondary | ICD-10-CM | POA: Diagnosis not present

## 2021-10-14 DIAGNOSIS — I130 Hypertensive heart and chronic kidney disease with heart failure and stage 1 through stage 4 chronic kidney disease, or unspecified chronic kidney disease: Secondary | ICD-10-CM | POA: Diagnosis not present

## 2021-10-14 DIAGNOSIS — N183 Chronic kidney disease, stage 3 unspecified: Secondary | ICD-10-CM | POA: Diagnosis not present

## 2021-10-14 DIAGNOSIS — I959 Hypotension, unspecified: Secondary | ICD-10-CM | POA: Diagnosis not present

## 2021-10-14 DIAGNOSIS — F5101 Primary insomnia: Secondary | ICD-10-CM

## 2021-10-14 DIAGNOSIS — R457 State of emotional shock and stress, unspecified: Secondary | ICD-10-CM | POA: Diagnosis not present

## 2021-10-14 DIAGNOSIS — G4700 Insomnia, unspecified: Secondary | ICD-10-CM | POA: Insufficient documentation

## 2021-10-14 NOTE — ED Triage Notes (Signed)
EMS stated, He has insomnia for 48 hours. Pt. Not happy with his primary provider and needs to change. Lives with his wife.

## 2021-10-14 NOTE — ED Provider Notes (Signed)
[facility] EMERGENCY DEPARTMENT Provider Note   CSN: [id number] Arrival date & time: 10/14/21  0825     History Chief Complaint  Patient presents with   Insomnia    [name] [name] is a 87 y.o. male.   Insomnia Pertinent negatives include no chest pain, no abdominal pain and no shortness of breath.  87 year old male with a PMH significant for CKD, gastric cancer, HTN, HLD, and others as below who presents accompanied by his wife with complaints of insomnia.  He reports that for the last 2 days, he has had difficulty falling asleep and staying asleep.  He reports he gets up to urinate and when he goes back to sleep he cannot fall asleep as normal.  He denies any other associated symptoms, such as dizziness or lightheadedness, syncope, headache, chest pain or shortness of breath, confusion, abdominal pain, nausea or vomiting, or decreased p.o. intake.  He denies any recent illnesses or injuries.  He has not recently changed any medications.  He is not waking up gasping or with any shortness of breath, and has not been urinating overnight anymore than normal.  He tried taking a Tylenol PM at home, but he states it did not help.  He has a regular PCP that he follows with regularly.    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[place])    [name] 03/13/2017   Hemorrhagic shock ([place]) 09/13/2014   Hyperlipidemia    TAKES zOCOR DAILY   Hypertension    TAKES LOTREL,HCTZ,AND METOPROLOL DAILY   Intraperitoneal hemorrhage 09/17/2014   NSTEMI (non-ST elevated myocardial infarction) ([place]) 02/2013   "LIGHT: ONE MD SAID HE DID AND ONE SAID HE DIDN'T   Pneumonia    MAR 2014   Shortness of breath    Stromal tumor of digestive system ([place])    UTI (urinary tract infection)      Patient Active Problem List   Diagnosis Date Noted   Demand ischemia ([place])    Cholecystitis 09/26/2020   Partial small bowel obstruction ([place]) 04/03/2017   Abscess of abdominal cavity ([place])    Ascites    Sepsis ([place]) 07/26/2015   Abdominal abscess    C. difficile diarrhea 05/10/2015   Urinary retention 05/07/2015   CKD (chronic kidney disease), stage III (HCC)    SIRS (systemic inflammatory response syndrome) ([place]) 05/06/2015   Epigastric pain    Abdominal pain 01/13/2015   Abdominal carcinomatosis ([place]) 42/70/6237   Diastolic CHF, acute on chronic ([place]) 09/17/2014   Hyperlipidemia    CAD in native artery    Hypertension    GIST (gastrointestinal stromal tumor) of small bowel, malignant ([place]) 06/20/2013   Acute kidney failure ([place]) 02/28/2013   NSTEMI (non-ST elevated myocardial infarction) ([place]) 02/27/2013   Chronic anemia 02/27/2013    Past Surgical History:  Procedure Laterality Date   CARDIAC CATHETERIZATION  03-05-13   COLONOSCOPY  09/08/2014   dr scooler   CYST EXCISION  1962   wrist   IR GENERIC HISTORICAL  05/17/2016   IR RADIOLOGIST EVAL & MGMT 05/17/2016 [name], MD [facility] INTERV RAD   IR GENERIC HISTORICAL  07/26/2016   IR RADIOLOGIST EVAL & MGMT 07/26/2016 [name], MD [facility] INTERV RAD   LAPAROTOMY N/A 06/04/2013   Procedure: EXPLORATORY LAPAROTOMY, OPEN  RESECTION OF MESENTERIC AND INTESTINAL MASS;  Surgeon: [name], MD;  Location: [facility];  Service: General;  Laterality: N/A;  Small Bowel Resection   LEFT HEART CATHETERIZATION WITH CORONARY ANGIOGRAM N/A 03/05/2013   Procedure: LEFT HEART CATHETERIZATION WITH CORONARY ANGIOGRAM;  Surgeon: [name], MD;  Location: [facility] CATH LAB;  Service: Cardiovascular;  Laterality: N/A;       Family History  Problem Relation Age of Onset   Stroke Mother    Skin cancer Father    Skin cancer Brother     Social History   Tobacco Use   Smoking status: Never   Smokeless tobacco: Never  Vaping  Use   Vaping Use: Never used  Substance Use Topics   Alcohol use: No   Drug use: No    Home Medications Prior to Admission medications   Medication Sig Start Date End Date Taking? Authorizing Provider  amLODipine (NORVASC) 5 MG tablet Take 5 mg by mouth daily. 03/17/15   [provider]  furosemide (LASIX) 20 MG tablet Take 1 tablet (20 mg total) by mouth daily. 05/20/15   Hosie Poisson, MD  GLEEVEC 400 MG tablet TAKE 1 TABLET (400 MG TOTAL) BY MOUTH DAILY. TAKE WITH MEAL AND LARGE GLASS OF WATER. 06/17/21 06/17/22  Ladell Pier, MD  metoprolol tartrate (LOPRESSOR) 25 MG tablet Take 1 tablet (25 mg total) by mouth 2 (two) times daily. 03/06/13   Caren Griffins, MD  Potassium Chloride (KLOR-CON 10 PO) Take 10 mEq by mouth daily.    [provider]  simvastatin (ZOCOR) 20 MG tablet Take 20 mg by mouth daily. 10/21/20   [provider]  sulfamethoxazole-trimethoprim (BACTRIM DS) 800-160 MG tablet Take 1 tablet by mouth 2 (two) times daily. 06/02/21   [provider]  tamsulosin (FLOMAX) 0.4 MG CAPS capsule Take 0.4 mg by mouth daily. 02/04/20   [provider]    Allergies    Patient has no known allergies.  Review of Systems   Review of Systems  Constitutional:  Negative for chills and fever.  HENT:  Negative for congestion and sore throat.   Eyes:  Negative for visual disturbance.  Respiratory:  Negative for cough and shortness of breath.   Cardiovascular:  Negative for chest pain and palpitations.  Gastrointestinal:  Negative for abdominal pain, nausea and vomiting.  Genitourinary:  Negative for difficulty urinating, dysuria and hematuria.  Musculoskeletal:  Negative for arthralgias and back pain.  Skin:  Negative for color change and rash.  Neurological:  Negative for seizures and syncope.  Psychiatric/Behavioral:  Positive for sleep disturbance. Negative for behavioral problems, confusion, decreased concentration and hallucinations. The  patient has insomnia. The patient is not nervous/anxious.   All other systems reviewed and are negative.  Physical Exam Updated Vital Signs BP (!) 141/56 (BP Location: Left Arm)   Pulse 74   Temp 98.1 F (36.7 C) (Oral)   Resp 18   SpO2 100%   Physical Exam Vitals and nursing note reviewed.  Constitutional:      General: He is not in acute distress.    Appearance: Normal appearance. He is well-developed and normal weight.  HENT:     Head: Normocephalic and atraumatic.     Right Ear: External ear normal.     Left Ear: External ear normal.     Nose: Nose normal.     Mouth/Throat:     Mouth: Mucous membranes are moist.     Pharynx: Oropharynx is clear.  Eyes:     General: No scleral icterus.    Extraocular Movements: Extraocular movements intact.     Conjunctiva/sclera: Conjunctivae normal.     Pupils: Pupils are equal, round, and reactive to light.  Cardiovascular:     Rate and Rhythm: Normal rate and regular rhythm.     Heart sounds: No murmur heard. Pulmonary:     Effort: Pulmonary effort is normal. No respiratory distress.     Breath sounds: Normal breath sounds.  Abdominal:     General: There is no distension.     Palpations: Abdomen is soft.     Tenderness: There is no abdominal tenderness.  Musculoskeletal:        General: Normal range of motion.     Cervical back: Normal range of motion and neck supple. No rigidity or tenderness.     Right lower leg: Edema present.     Left lower leg: Edema present.  Lymphadenopathy:     Cervical: No cervical adenopathy.  Skin:    General: Skin is warm and dry.     Capillary Refill: Capillary refill takes less than 2 seconds.  Neurological:     General: No focal deficit present.     Mental Status: He is alert and oriented to person, place, and time. Mental status is at baseline.     GCS: GCS eye subscore is 4. GCS verbal subscore is 5. GCS motor subscore is 6.     Cranial Nerves: Cranial nerves 2-12 are intact.     Sensory:  Sensation is intact.     Motor: Motor function is intact.  Psychiatric:        Attention and Perception: Attention and perception normal.        Mood and Affect: Mood normal.        Speech: Speech normal.        Behavior: Behavior normal. Behavior is cooperative.        Cognition and Memory: Cognition and memory normal.    ED Results / Procedures / Treatments   Labs (all labs ordered are listed, but only abnormal results are displayed) Labs Reviewed - No data to display  EKG None  Radiology No results found.  Procedures Procedures   Medications Ordered in ED Medications - No data to display  ED Course  I have reviewed the triage vital signs and the nursing notes.  Pertinent labs & imaging results that were available during my care of the patient were reviewed by me and considered in my medical decision making (see chart for details).    MDM Rules/Calculators/A&P                          [name] [name] is a 87 y.o. male presenting with insomnia. Initial VS sig for mild HTN.  No further work-up indicated.  DDX considered: BPH, sepsis, OSA, depression, polypharmacy, medication adverse effect. History, examination, and objective data most consistent with idiopathic insomnia.  No recent medication changes contributing to his symptoms.  Patient has no systemic infectious signs or symptoms.  No mood change complaints.  No increased urinary frequency.  No reported shortness of breath, snoring, or gasping concerning for OSA.  Patient is overall well-appearing and there is low suspicion for emergent or surgical pathology at this time.  Medications: Medications - No data to display    Re-evaluated prior to discharge. Hemodynamically stable and in no acute distress.  Discharged home in stable condition. Strict ED return precautions  advised. Supportive care discussed, including sleep hygiene and initiation of possible melatonin.  Outpatient PCP follow-up advised, patient states he will  call for an appointment.  Patient understands and agrees with the plan.  Family updated at the bedside.  The plan for this patient was discussed with my attending physician, who voiced agreement and who oversaw evaluation and treatment of this patient.     Note: Estate manager/land agent was used in the creation of this note.  Final Clinical Impression(s) / ED Diagnoses Final diagnoses:  Primary insomnia    Rx / DC Orders ED Discharge Orders     None        Cherly Hensen, DO 10/14/21 1736    [place], MD 10/16/21 [phone]

## 2021-10-14 NOTE — ED Notes (Signed)
Pt states that over the past two weeks he has been sleeping less. Starting two days ago pt states he hasn't slept at all.

## 2021-10-14 NOTE — Discharge Instructions (Signed)
Dear Beau Fanny,  Thank you for allowing [place] to take care of you today.  We hope you begin feeling better soon.  - Please follow-up with your primary care physician or schedule an appointment to establish a primary care doctor if you do not have one already. It is important that you review any labs or imaging results (if any) obtained today with them. The preliminary imaging results and diagnosis (if any) are attached. - Please return to the Emergency Department or call 911 for chest pain, shortness of breath, severe pain, altered mental status, or if you have any reason to think you may need emergency medical care. -Consider sleep behavior changes, such as keeping your room cool and dark.  Stop using screens, including phone and TV, at least 2 hours before bed. -Try using melatonin as discussed.  Start with 3 mg every night before bed, approximately 30 minutes before sleep.  You may try up to 6 mg if 3 mg does not work. -Call your PCP to discuss long-term management of sleep difficulties -Avoid drinking excessive fluids in the last 1 to 2 hours before bed   Sincerely,  Cherly Hensen, DO Department of Emergency Medicine [facility]   Primary insomnia @[email or web address] @

## 2021-10-14 NOTE — ED Notes (Signed)
Patient verbalizes understanding of discharge instructions. Pt made aware of over the counter medications (melatonin) that were recommend to take and the dosage to start with. Opportunity for questioning and answers were provided. Armband removed by staff, pt discharged from ED via wheelchair.

## 2021-10-18 DIAGNOSIS — Z72820 Sleep deprivation: Secondary | ICD-10-CM | POA: Diagnosis not present

## 2021-10-22 DIAGNOSIS — G4701 Insomnia due to medical condition: Secondary | ICD-10-CM | POA: Diagnosis not present

## 2021-10-27 ENCOUNTER — Emergency Department (HOSPITAL_COMMUNITY): Payer: Medicare Other

## 2021-10-27 ENCOUNTER — Encounter (HOSPITAL_COMMUNITY): Payer: Self-pay

## 2021-10-27 ENCOUNTER — Emergency Department (HOSPITAL_COMMUNITY)
Admission: EM | Admit: 2021-10-27 | Discharge: 2021-10-27 | Disposition: A | Discharge status: Home / Self Care | Payer: Medicare Other | Attending: Emergency Medicine | Admitting: Emergency Medicine

## 2021-10-27 DIAGNOSIS — Z79899 Other long term (current) drug therapy: Secondary | ICD-10-CM | POA: Diagnosis not present

## 2021-10-27 DIAGNOSIS — E860 Dehydration: Secondary | ICD-10-CM | POA: Diagnosis not present

## 2021-10-27 DIAGNOSIS — R1084 Generalized abdominal pain: Secondary | ICD-10-CM | POA: Diagnosis not present

## 2021-10-27 DIAGNOSIS — R1033 Periumbilical pain: Secondary | ICD-10-CM | POA: Diagnosis not present

## 2021-10-27 DIAGNOSIS — R109 Unspecified abdominal pain: Secondary | ICD-10-CM | POA: Diagnosis not present

## 2021-10-27 DIAGNOSIS — N183 Chronic kidney disease, stage 3 unspecified: Secondary | ICD-10-CM | POA: Insufficient documentation

## 2021-10-27 DIAGNOSIS — I2510 Atherosclerotic heart disease of native coronary artery without angina pectoris: Secondary | ICD-10-CM | POA: Diagnosis not present

## 2021-10-27 DIAGNOSIS — I5033 Acute on chronic diastolic (congestive) heart failure: Secondary | ICD-10-CM | POA: Diagnosis not present

## 2021-10-27 DIAGNOSIS — I130 Hypertensive heart and chronic kidney disease with heart failure and stage 1 through stage 4 chronic kidney disease, or unspecified chronic kidney disease: Secondary | ICD-10-CM | POA: Diagnosis not present

## 2021-10-27 DIAGNOSIS — R197 Diarrhea, unspecified: Secondary | ICD-10-CM | POA: Diagnosis not present

## 2021-10-27 DIAGNOSIS — R531 Weakness: Secondary | ICD-10-CM | POA: Diagnosis not present

## 2021-10-27 LAB — COMPREHENSIVE METABOLIC PANEL
ALT: 15 U/L (ref 0–44)
AST: 22 U/L (ref 15–41)
Albumin: 4 g/dL (ref 3.5–5.0)
Alkaline Phosphatase: 48 U/L (ref 38–126)
Anion gap: 6 (ref 5–15)
BUN: 29 mg/dL — ABNORMAL HIGH (ref 8–23)
CO2: 23 mmol/L (ref 22–32)
Calcium: 8.6 mg/dL — ABNORMAL LOW (ref 8.9–10.3)
Chloride: 108 mmol/L (ref 98–111)
Creatinine, Ser: 1.76 mg/dL — ABNORMAL HIGH (ref 0.61–1.24)
GFR, Estimated: 36 mL/min — ABNORMAL LOW (ref >60)
Glucose, Bld: 121 mg/dL — ABNORMAL HIGH (ref 70–99)
Potassium: 3.8 mmol/L (ref 3.5–5.1)
Sodium: 137 mmol/L (ref 135–145)
Total Bilirubin: 0.7 mg/dL (ref 0.3–1.2)
Total Protein: 6.9 g/dL (ref 6.5–8.1)

## 2021-10-27 LAB — CBC
HCT: 32.3 % — ABNORMAL LOW (ref 39.0–52.0)
Hemoglobin: 11 g/dL — ABNORMAL LOW (ref 13.0–17.0)
MCH: 36.4 pg — ABNORMAL HIGH (ref 26.0–34.0)
MCHC: 34.1 g/dL (ref 30.0–36.0)
MCV: 107 fL — ABNORMAL HIGH (ref 80.0–100.0)
Platelets: 191 10*3/uL (ref 150–400)
RBC: 3.02 MIL/uL — ABNORMAL LOW (ref 4.22–5.81)
RDW: 11.9 % (ref 11.5–15.5)
WBC: 6.1 10*3/uL (ref 4.0–10.5)
nRBC: 0 % (ref 0.0–0.2)

## 2021-10-27 LAB — URINALYSIS, ROUTINE W REFLEX MICROSCOPIC
Bilirubin Urine: NEGATIVE
Glucose, UA: NEGATIVE mg/dL
Hgb urine dipstick: NEGATIVE
Ketones, ur: NEGATIVE mg/dL
Leukocytes,Ua: NEGATIVE
Nitrite: NEGATIVE
Protein, ur: NEGATIVE mg/dL
Specific Gravity, Urine: 1.012 (ref 1.005–1.030)
pH: 5 (ref 5.0–8.0)

## 2021-10-27 MED ORDER — LOPERAMIDE HCL 2 MG PO CAPS: 2.0000 mg | ORAL_CAPSULE | Freq: Four times a day (QID) | ORAL | 0 refills | Status: AC | PRN

## 2021-10-27 MED ORDER — SODIUM CHLORIDE (PF) 0.9 % IJ SOLN
INTRAMUSCULAR | Status: AC
  Filled 2021-10-27: qty 50

## 2021-10-27 MED ORDER — LACTATED RINGERS IV BOLUS
500.0000 mL | Freq: Once | INTRAVENOUS | Status: AC
  Administered 2021-10-27: 500 mL via INTRAVENOUS

## 2021-10-27 MED ORDER — IOHEXOL 350 MG/ML SOLN
60.0000 mL | Freq: Once | INTRAVENOUS | Status: AC | PRN
  Administered 2021-10-27: 60 mL via INTRAVENOUS

## 2021-10-27 NOTE — ED Provider Notes (Signed)
Emergency Medicine Provider Triage Evaluation Note  Riad Wagley , a 87 y.o. male  was evaluated in triage.  Pt complains of a history of diarrhea with umbilical abdominal pain.  PCP recommended come to ED for evaluation for possible dehydration.    Review of Systems  Positive: Abdominal pain, diarrhea Negative: Nausea, vomiting, hematochezia, dysuria, hematuria, altered mental status  Physical Exam  BP (!) 152/80 (BP Location: Left Arm)   Pulse 69   Temp 97.9 F (36.6 C) (Oral)   Resp 16   SpO2 100%  Gen:   Awake, no distress   Resp:  Normal effort  MSK:   Moves extremities without difficulty  Other:  Abdomen is soft nondistended.  Normoactive bowel sounds.  Nontender to palpation.  No obvious bruising.  Medical Decision Making  Medically screening exam initiated at 11:58 AM.  Appropriate orders placed.  Breckyn Ticas was informed that the remainder of the evaluation will be completed by another provider, this initial triage assessment does not replace that evaluation, and the importance of remaining in the ED until their evaluation is complete.  Given patient's age and duration of symptoms, will proceed with basic labs plus UA and CT scan of abdomen pelvis   Tonye Pearson, PA-C 10/27/21 [place], DO 10/27/21 1437

## 2021-10-27 NOTE — ED Notes (Signed)
[facility] paged for pt transport.

## 2021-10-27 NOTE — ED Provider Notes (Signed)
[facility] DEPT Provider Note   CSN: [id number] Arrival date & time: 10/27/21  1128     History Chief Complaint  Patient presents with   Abdominal Pain    diarrhea    [name] [name] is a 87 y.o. male with medical history significant for CKD and GIST who presents to the ED today for evaluation of periumbilical abdominal pain 3-day history of diarrhea.  Patient having multiple episodes of diarrhea today, often not being able to make it to the bathroom fast enough.  Denies blood, mucus.  He also denies fever, chills, nausea, vomiting chest pain, shortness of breath, numbness and tingling.   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[place])    [name] 03/13/2017   Hemorrhagic shock ([place]) 09/13/2014   Hyperlipidemia    TAKES zOCOR DAILY   Hypertension    TAKES LOTREL,HCTZ,AND METOPROLOL DAILY   Intraperitoneal hemorrhage 09/17/2014   NSTEMI (non-ST elevated myocardial infarction) ([place]) 02/2013   "LIGHT: ONE MD SAID HE DID AND ONE SAID HE DIDN'T   Pneumonia    MAR 2014   Shortness of breath    Stromal tumor of digestive system ([place])    UTI (urinary tract infection)     Patient Active Problem List   Diagnosis Date Noted   Demand ischemia ([place])    Cholecystitis 09/26/2020   Partial small bowel obstruction ([place]) 04/03/2017   Abscess of abdominal cavity ([place])    Ascites    Sepsis ([place]) 07/26/2015   Abdominal abscess    C. difficile diarrhea 05/10/2015   Urinary retention 05/07/2015   CKD (chronic kidney disease), stage III (HCC)    SIRS (systemic inflammatory response syndrome) ([place]) 05/06/2015    Epigastric pain    Abdominal pain 01/13/2015   Abdominal carcinomatosis ([place]) 40/97/3532   Diastolic CHF, acute on chronic ([place]) 09/17/2014   Hyperlipidemia    CAD in native artery    Hypertension    GIST (gastrointestinal stromal tumor) of small bowel, malignant ([place]) 06/20/2013   Acute kidney failure ([place]) 02/28/2013   NSTEMI (non-ST elevated myocardial infarction) ([place]) 02/27/2013   Chronic anemia 02/27/2013    Past Surgical History:  Procedure Laterality Date   CARDIAC CATHETERIZATION  03-05-13   COLONOSCOPY  09/08/2014   dr scooler   CYST EXCISION  1962   wrist   IR GENERIC HISTORICAL  05/17/2016   IR RADIOLOGIST EVAL & MGMT 05/17/2016 [name], MD [facility] INTERV RAD   IR GENERIC HISTORICAL  07/26/2016   IR RADIOLOGIST EVAL & MGMT 07/26/2016 [name], MD [facility] INTERV RAD   LAPAROTOMY N/A 06/04/2013   Procedure: EXPLORATORY LAPAROTOMY, OPEN RESECTION OF MESENTERIC AND INTESTINAL MASS;  Surgeon: [name], MD;  Location: [facility];  Service: General;  Laterality: N/A;  Small Bowel Resection   LEFT HEART CATHETERIZATION WITH CORONARY ANGIOGRAM N/A 03/05/2013   Procedure: LEFT HEART CATHETERIZATION WITH CORONARY ANGIOGRAM;  Surgeon: [name], MD;  Location: [facility] CATH LAB;  Service: Cardiovascular;  Laterality: N/A;       Family History  Problem Relation Age of Onset   Stroke Mother    Skin cancer  Father    Skin cancer Brother     Social History   Tobacco Use   Smoking status: Never   Smokeless tobacco: Never  Vaping Use   Vaping Use: Never used  Substance Use Topics   Alcohol use: No   Drug use: No    Home Medications Prior to Admission medications   Medication Sig Start Date End Date Taking? Authorizing Provider  amLODipine (NORVASC) 5 MG tablet Take 5 mg by mouth daily. 03/17/15   [provider]  furosemide (LASIX) 20 MG tablet Take 1 tablet (20 mg total) by mouth daily. 05/20/15   Hosie Poisson, MD  GLEEVEC 400 MG tablet TAKE 1 TABLET (400  MG TOTAL) BY MOUTH DAILY. TAKE WITH MEAL AND LARGE GLASS OF WATER. 06/17/21 06/17/22  Ladell Pier, MD  loperamide (IMODIUM) 2 MG capsule Take 1 capsule (2 mg total) by mouth 4 (four) times daily as needed for diarrhea or loose stools. 10/27/21  Yes Kathe Becton R, PA-C  metoprolol tartrate (LOPRESSOR) 25 MG tablet Take 1 tablet (25 mg total) by mouth 2 (two) times daily. 03/06/13   Caren Griffins, MD  Potassium Chloride (KLOR-CON 10 PO) Take 10 mEq by mouth daily.    [provider]  simvastatin (ZOCOR) 20 MG tablet Take 20 mg by mouth daily. 10/21/20   [provider]  sulfamethoxazole-trimethoprim (BACTRIM DS) 800-160 MG tablet Take 1 tablet by mouth 2 (two) times daily. 06/02/21   [provider]  tamsulosin (FLOMAX) 0.4 MG CAPS capsule Take 0.4 mg by mouth daily. 02/04/20   [provider]    Allergies    Patient has no known allergies.  Review of Systems   Review of Systems  Constitutional:  Negative for fever.  HENT: Negative.    Eyes: Negative.   Respiratory:  Negative for shortness of breath.   Cardiovascular: Negative.   Gastrointestinal:  Positive for abdominal pain and diarrhea. Negative for nausea and vomiting.  Endocrine: Negative.   Genitourinary: Negative.   Musculoskeletal: Negative.   Skin:  Negative for rash.  Neurological:  Negative for headaches.  All other systems reviewed and are negative.  Physical Exam Updated Vital Signs BP 134/63   Pulse 66   Temp 97.9 F (36.6 C) (Oral)   Resp 16   SpO2 97%   Physical Exam Vitals and nursing note reviewed.  Constitutional:      General: He is not in acute distress.    Appearance: He is not ill-appearing or toxic-appearing.  HENT:     Head: Atraumatic.     Nose: Nose normal.     Mouth/Throat:     Mouth: Mucous membranes are moist.  Eyes:     Conjunctiva/sclera: Conjunctivae normal.  Cardiovascular:     Rate and Rhythm: Normal rate and regular rhythm.     Pulses: Normal  pulses.     Heart sounds: No murmur heard. Pulmonary:     Effort: Pulmonary effort is normal. No respiratory distress.     Breath sounds: Normal breath sounds.  Abdominal:     General: Abdomen is flat. There is no distension.     Palpations: Abdomen is soft.     Tenderness: There is no abdominal tenderness. Negative signs include Murphy's sign, Rovsing's sign and McBurney's sign.     Comments: Abdomen is soft nondistended.  Normoactive bowel sounds.  Nontender to palpation in all 4 quadrants.  No organomegaly.  No suprapubic tenderness.  Negative CVA tenderness.  Musculoskeletal:  General: Normal range of motion.     Cervical back: Normal range of motion.  Skin:    General: Skin is warm and dry.     Capillary Refill: Capillary refill takes less than 2 seconds.  Neurological:     General: No focal deficit present.     Mental Status: He is alert.  Psychiatric:        Mood and Affect: Mood normal.    ED Results / Procedures / Treatments   Labs (all labs ordered are listed, but only abnormal results are displayed) Labs Reviewed  COMPREHENSIVE METABOLIC PANEL - Abnormal; Notable for the following components:      Result Value   Glucose, Bld 121 (*)    BUN 29 (*)    Creatinine, Ser 1.76 (*)    Calcium 8.6 (*)    GFR, Estimated 36 (*)    All other components within normal limits  CBC - Abnormal; Notable for the following components:   RBC 3.02 (*)    Hemoglobin 11.0 (*)    HCT 32.3 (*)    MCV 107.0 (*)    MCH 36.4 (*)    All other components within normal limits  URINALYSIS, ROUTINE W REFLEX MICROSCOPIC    EKG None  Radiology CT Abdomen Pelvis W Contrast  Result Date: 10/27/2021 CLINICAL DATA:  Diarrhea, abdominal pain EXAM: CT ABDOMEN AND PELVIS WITH CONTRAST TECHNIQUE: Multidetector CT imaging of the abdomen and pelvis was performed using the standard protocol following bolus administration of intravenous contrast. CONTRAST:  57mL OMNIPAQUE IOHEXOL 350 MG/ML SOLN  COMPARISON:  06/04/2021 FINDINGS: Lower chest: No acute abnormality.  Coronary artery calcifications. Hepatobiliary: No solid liver abnormality is seen. Gallstones in the dependent gallbladder. Multiple small gallstones are again seen in the central common bile duct, similar to prior examination (series 6, image 56, series 2, image 22). Pancreas: Unremarkable. No pancreatic ductal dilatation or surrounding inflammatory changes. Spleen: Normal in size without significant abnormality. Adrenals/Urinary Tract: Adrenal glands are unremarkable. Kidneys are normal, without renal calculi, solid lesion, or hydronephrosis. Bladder is unremarkable. Stomach/Bowel: Stomach is within normal limits. Appendix not clearly visualized and may be surgically absent. Status post distal ileal resection and reanastomosis. No evidence of bowel wall thickening, distention, or inflammatory changes. Sigmoid diverticula. Vascular/Lymphatic: Aortic atherosclerosis. No enlarged abdominal or pelvic lymph nodes. Reproductive: No mass or other significant abnormality. Other: Unchanged large right inguinal hernia containing nonobstructed distal small bowel (series 2, image 79). Small, fat containing left inguinal hernia. No abdominopelvic ascites. Musculoskeletal: No acute or significant osseous findings. IMPRESSION: 1. No acute CT findings of the abdomen or pelvis to explain diarrhea or abdominal pain. 2. Gallstones and multiple small gallstones in the central common bile duct, similar to prior examination. No biliary ductal dilatation. 3. Unchanged large right inguinal hernia containing nonobstructed distal small bowel. 4. Status post distal ileal resection and reanastomosis. 5. Coronary artery disease. Aortic Atherosclerosis (ICD10-I70.0). Electronically Signed   By: Delanna Ahmadi M.D.   On: 10/27/2021 14:29    Procedures Procedures   Medications Ordered in ED Medications  sodium chloride (PF) 0.9 % injection (has no administration in time  range)  lactated ringers bolus 500 mL (500 mLs Intravenous New Bag/Given (Non-Interop) 10/27/21 1342)  iohexol (OMNIPAQUE) 350 MG/ML injection 60 mL (60 mLs Intravenous Contrast Given 10/27/21 1400)    ED Course  I have reviewed the triage vital signs and the nursing notes.  Pertinent labs & imaging results that were available during my care of the patient  were reviewed by me and considered in my medical decision making (see chart for details).    MDM Rules/Calculators/A&P                         This is a 87 year old male with history significant for gastrointestinal cancer and CKD who presents today for abdominal pain and 3-day history of diarrhea.  He was furred here by his PCP out of concern for possible dehydration.  Labs ordered in the ED: CBC and CMP are unremarkable and around patient's baseline.  UA negative for signs of infection.  Can rule out UTI.  Imaging ordered in the ED: CT scan of abdomen and pelvis was notified no possible causes of his diarrhea and stomach pain.  He has some mild gallstones as well as a nonobstructing hernia that have been stable from previous imaging.   Patient was given 500 mL bolus of lactated Ringer's since he will be undergoing IV contrast for his CT scan.  His labs show no evidence of dehydration or active infection.  CT scan showed no active infection or other cause for his symptoms.  Given his overall reassuring physical exam and work-up, I feel that patient is stable enough to discharge home with instructions to continue pushing oral hydration.  Can use Imodium as needed for his diarrhea.  If symptoms worsen or he develops a fever he should return to the ED soon as possible.     Kathe Becton, PA-C  Final Clinical Impression(s) / ED Diagnoses Final diagnoses:  Diarrhea, unspecified type  Periumbilical abdominal pain    Rx / DC Orders ED Discharge Orders          Ordered    loperamide (IMODIUM) 2 MG capsule  4 times daily PRN         10/27/21 1500             Tonye Pearson, PA-C 10/27/21 1518    Lorelle Gibbs, DO 10/28/21 8638

## 2021-10-27 NOTE — Discharge Instructions (Signed)
You were seen today for evaluation of your abdominal pain and 3 days of diarrhea.  You said that your doctor had concerned that you are dehydrated however your lab work today did not show any concerning abnormalities.  Your CT scan was unremarkable for potential causes of your diarrhea and stomach pain as well.  I have sent you in a prescription for Imodium, although you can take this over-the-counter if you have it at home as well.  Keep pushing water to avoid dehydration with your trainer.  If your diarrhea fails to improve, you a fever symptoms, please return to the ED.  I hope you feel better soon!

## 2021-10-27 NOTE — ED Triage Notes (Signed)
Pt presents to the ED for abd pain and diarrhea x3 days. Per EMS, pt has a known hx of HTN. Pt denies any additional complaints including fever/chills and N/V. Hx provided by EMS.

## 2021-11-04 ENCOUNTER — Ambulatory Visit: Payer: PRIVATE HEALTH INSURANCE | Admitting: Oncology

## 2021-11-04 ENCOUNTER — Other Ambulatory Visit: Payer: PRIVATE HEALTH INSURANCE

## 2021-11-05 ENCOUNTER — Other Ambulatory Visit: Payer: Self-pay

## 2021-11-05 ENCOUNTER — Emergency Department (HOSPITAL_COMMUNITY): Payer: Medicare Other

## 2021-11-05 ENCOUNTER — Emergency Department (HOSPITAL_COMMUNITY)
Admission: EM | Admit: 2021-11-05 | Discharge: 2021-11-05 | Disposition: A | Discharge status: Home / Self Care | Payer: Medicare Other | Attending: Emergency Medicine | Admitting: Emergency Medicine

## 2021-11-05 ENCOUNTER — Encounter (HOSPITAL_COMMUNITY): Payer: Self-pay

## 2021-11-05 DIAGNOSIS — K5900 Constipation, unspecified: Secondary | ICD-10-CM | POA: Diagnosis not present

## 2021-11-05 DIAGNOSIS — N179 Acute kidney failure, unspecified: Secondary | ICD-10-CM | POA: Diagnosis not present

## 2021-11-05 DIAGNOSIS — K4090 Unilateral inguinal hernia, without obstruction or gangrene, not specified as recurrent: Secondary | ICD-10-CM | POA: Diagnosis not present

## 2021-11-05 DIAGNOSIS — R1084 Generalized abdominal pain: Secondary | ICD-10-CM | POA: Diagnosis not present

## 2021-11-05 DIAGNOSIS — Z79899 Other long term (current) drug therapy: Secondary | ICD-10-CM | POA: Diagnosis not present

## 2021-11-05 DIAGNOSIS — I700 Atherosclerosis of aorta: Secondary | ICD-10-CM | POA: Diagnosis not present

## 2021-11-05 DIAGNOSIS — I5033 Acute on chronic diastolic (congestive) heart failure: Secondary | ICD-10-CM | POA: Insufficient documentation

## 2021-11-05 DIAGNOSIS — N183 Chronic kidney disease, stage 3 unspecified: Secondary | ICD-10-CM | POA: Diagnosis not present

## 2021-11-05 DIAGNOSIS — K8020 Calculus of gallbladder without cholecystitis without obstruction: Secondary | ICD-10-CM | POA: Diagnosis not present

## 2021-11-05 DIAGNOSIS — R197 Diarrhea, unspecified: Secondary | ICD-10-CM | POA: Diagnosis not present

## 2021-11-05 DIAGNOSIS — K5730 Diverticulosis of large intestine without perforation or abscess without bleeding: Secondary | ICD-10-CM | POA: Diagnosis not present

## 2021-11-05 DIAGNOSIS — I2510 Atherosclerotic heart disease of native coronary artery without angina pectoris: Secondary | ICD-10-CM | POA: Insufficient documentation

## 2021-11-05 DIAGNOSIS — R5381 Other malaise: Secondary | ICD-10-CM | POA: Diagnosis not present

## 2021-11-05 DIAGNOSIS — R109 Unspecified abdominal pain: Secondary | ICD-10-CM | POA: Diagnosis not present

## 2021-11-05 DIAGNOSIS — I130 Hypertensive heart and chronic kidney disease with heart failure and stage 1 through stage 4 chronic kidney disease, or unspecified chronic kidney disease: Secondary | ICD-10-CM | POA: Diagnosis not present

## 2021-11-05 LAB — CBC WITH DIFFERENTIAL/PLATELET
Abs Immature Granulocytes: 0.03 10*3/uL (ref 0.00–0.07)
Basophils Absolute: 0 10*3/uL (ref 0.0–0.1)
Basophils Relative: 0 %
Eosinophils Absolute: 0 10*3/uL (ref 0.0–0.5)
Eosinophils Relative: 0 %
HCT: 29.5 % — ABNORMAL LOW (ref 39.0–52.0)
Hemoglobin: 10.3 g/dL — ABNORMAL LOW (ref 13.0–17.0)
Immature Granulocytes: 0 %
Lymphocytes Relative: 9 %
Lymphs Abs: 0.7 10*3/uL (ref 0.7–4.0)
MCH: 36.9 pg — ABNORMAL HIGH (ref 26.0–34.0)
MCHC: 34.9 g/dL (ref 30.0–36.0)
MCV: 105.7 fL — ABNORMAL HIGH (ref 80.0–100.0)
Monocytes Absolute: 0.7 10*3/uL (ref 0.1–1.0)
Monocytes Relative: 9 %
Neutro Abs: 6.2 10*3/uL (ref 1.7–7.7)
Neutrophils Relative %: 82 %
Platelets: 203 10*3/uL (ref 150–400)
RBC: 2.79 MIL/uL — ABNORMAL LOW (ref 4.22–5.81)
RDW: 12.4 % (ref 11.5–15.5)
WBC: 7.7 10*3/uL (ref 4.0–10.5)
nRBC: 0 % (ref 0.0–0.2)

## 2021-11-05 LAB — COMPREHENSIVE METABOLIC PANEL
ALT: 15 U/L (ref 0–44)
AST: 22 U/L (ref 15–41)
Albumin: 3.5 g/dL (ref 3.5–5.0)
Alkaline Phosphatase: 40 U/L (ref 38–126)
Anion gap: 9 (ref 5–15)
BUN: 41 mg/dL — ABNORMAL HIGH (ref 8–23)
CO2: 23 mmol/L (ref 22–32)
Calcium: 8.3 mg/dL — ABNORMAL LOW (ref 8.9–10.3)
Chloride: 105 mmol/L (ref 98–111)
Creatinine, Ser: 2.33 mg/dL — ABNORMAL HIGH (ref 0.61–1.24)
GFR, Estimated: 26 mL/min — ABNORMAL LOW (ref >60)
Glucose, Bld: 130 mg/dL — ABNORMAL HIGH (ref 70–99)
Potassium: 3.3 mmol/L — ABNORMAL LOW (ref 3.5–5.1)
Sodium: 137 mmol/L (ref 135–145)
Total Bilirubin: 0.5 mg/dL (ref 0.3–1.2)
Total Protein: 6.3 g/dL — ABNORMAL LOW (ref 6.5–8.1)

## 2021-11-05 LAB — MAGNESIUM: Magnesium: 2 mg/dL (ref 1.7–2.4)

## 2021-11-05 LAB — LACTIC ACID, PLASMA: Lactic Acid, Venous: 1.9 mmol/L (ref 0.5–1.9)

## 2021-11-05 LAB — LIPASE, BLOOD: Lipase: 90 U/L — ABNORMAL HIGH (ref 11–51)

## 2021-11-05 MED ORDER — GLYCERIN (LAXATIVE) 2 G RE SUPP
1.0000 | Freq: Once | RECTAL | Status: DC
  Filled 2021-11-05: qty 1

## 2021-11-05 MED ORDER — GLYCERIN (ADULT) 2 G RE SUPP: 1.0000 | RECTAL | 0 refills | Status: DC | PRN

## 2021-11-05 MED ORDER — LACTATED RINGERS IV BOLUS: 500.0000 mL | Freq: Once | INTRAVENOUS | Status: DC

## 2021-11-05 MED ORDER — SODIUM CHLORIDE 0.9 % IV BOLUS
500.0000 mL | Freq: Once | INTRAVENOUS | Status: AC
  Administered 2021-11-05: 500 mL via INTRAVENOUS

## 2021-11-05 MED ORDER — HYDROMORPHONE HCL 1 MG/ML IJ SOLN
1.0000 mg | Freq: Once | INTRAMUSCULAR | Status: AC
  Administered 2021-11-05: 1 mg via INTRAVENOUS
  Filled 2021-11-05: qty 1

## 2021-11-05 MED ORDER — POLYETHYLENE GLYCOL 3350 17 GM/SCOOP PO POWD: 1.0000 | Freq: Once | ORAL | 0 refills | Status: AC

## 2021-11-05 MED ORDER — SODIUM CHLORIDE 0.9 % IV SOLN
1000.0000 mL | Freq: Once | INTRAVENOUS | Status: AC
  Administered 2021-11-05: 1000 mL via INTRAVENOUS

## 2021-11-05 MED ORDER — ONDANSETRON HCL 4 MG/2ML IJ SOLN
4.0000 mg | Freq: Once | INTRAMUSCULAR | Status: AC
  Administered 2021-11-05: 4 mg via INTRAVENOUS
  Filled 2021-11-05: qty 2

## 2021-11-05 MED ORDER — SENNOSIDES-DOCUSATE SODIUM 8.6-50 MG PO TABS: 1.0000 | ORAL_TABLET | Freq: Every day | ORAL | 0 refills | Status: AC

## 2021-11-05 MED ORDER — POTASSIUM CHLORIDE CRYS ER 20 MEQ PO TBCR
20.0000 meq | EXTENDED_RELEASE_TABLET | Freq: Once | ORAL | Status: AC
  Administered 2021-11-05: 20 meq via ORAL
  Filled 2021-11-05: qty 1

## 2021-11-05 NOTE — ED Provider Notes (Signed)
[facility] DEPT Provider Note   CSN: [id number] Arrival date & time: 11/05/21  0848     History No chief complaint on file.   [name] [name] is a 87 y.o. male.  HPI  87 year old male with a history of GI stromal tumor status postresection, HLD, HTN presenting to the emergency department with generalized abdominal pain and rectal pain for the last 3 days.  The patient states that he feels he is constipated.  He has not had a bowel movement the past 3 days.  He states he is passing gas up until today.  He has been trying Dulcolax and MiraLAX at home without significant relief.  He states that his rectal pain is sharp and nonradiating.  Nothing makes it better or worse.  He denies any nausea or vomiting.  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[place])    [name] 03/13/2017   Hemorrhagic shock ([place]) 09/13/2014   Hyperlipidemia    TAKES zOCOR DAILY   Hypertension    TAKES LOTREL,HCTZ,AND METOPROLOL DAILY   Intraperitoneal hemorrhage 09/17/2014   NSTEMI (non-ST elevated myocardial infarction) ([place]) 02/2013   "LIGHT: ONE MD SAID HE DID AND ONE SAID HE DIDN'T   Pneumonia    MAR 2014   Shortness of breath    Stromal tumor of digestive system ([place])    UTI (urinary tract infection)     Patient Active Problem List   Diagnosis Date Noted   Demand ischemia ([place])    Cholecystitis 09/26/2020   Partial small bowel obstruction ([place]) 04/03/2017   Abscess of abdominal cavity ([place])    Ascites    Sepsis ([place]) 07/26/2015   Abdominal abscess    C. difficile diarrhea 05/10/2015   Urinary retention 05/07/2015   CKD (chronic kidney disease), stage III (HCC)    SIRS (systemic inflammatory response syndrome) ([place]) 05/06/2015   Epigastric pain    Abdominal  pain 01/13/2015   Abdominal carcinomatosis ([place]) 74/07/1447   Diastolic CHF, acute on chronic ([place]) 09/17/2014   Hyperlipidemia    CAD in native artery    Hypertension    GIST (gastrointestinal stromal tumor) of small bowel, malignant ([place]) 06/20/2013   Acute kidney failure ([place]) 02/28/2013   NSTEMI (non-ST elevated myocardial infarction) ([place]) 02/27/2013   Chronic anemia 02/27/2013    Past Surgical History:  Procedure Laterality Date   CARDIAC CATHETERIZATION  03-05-13   COLONOSCOPY  09/08/2014   dr scooler   CYST EXCISION  1962   wrist   IR GENERIC HISTORICAL  05/17/2016   IR RADIOLOGIST EVAL & MGMT 05/17/2016 [name], MD [facility] INTERV RAD   IR GENERIC HISTORICAL  07/26/2016   IR RADIOLOGIST EVAL & MGMT 07/26/2016 [name], MD [facility] INTERV RAD   LAPAROTOMY N/A 06/04/2013   Procedure: EXPLORATORY LAPAROTOMY, OPEN RESECTION OF MESENTERIC AND INTESTINAL MASS;  Surgeon: [name], MD;  Location: [facility];  Service: General;  Laterality: N/A;  Small Bowel Resection   LEFT HEART CATHETERIZATION WITH CORONARY ANGIOGRAM N/A 03/05/2013   Procedure: LEFT HEART CATHETERIZATION WITH CORONARY ANGIOGRAM;  Surgeon: [name], MD;  Location: [facility] CATH LAB;  Service: Cardiovascular;  Laterality: N/A;       Family History  Problem Relation Age of Onset   Stroke Mother  Skin cancer Father    Skin cancer Brother     Social History   Tobacco Use   Smoking status: Never   Smokeless tobacco: Never  Vaping Use   Vaping Use: Never used  Substance Use Topics   Alcohol use: No   Drug use: No    Home Medications Prior to Admission medications   Medication Sig Start Date End Date Taking? Authorizing Provider  glycerin adult 2 g suppository Place 1 suppository rectally as needed for constipation. 11/05/21  Yes Regan Lemming, MD  polyethylene glycol powder (GLYCOLAX/MIRALAX) 17 GM/SCOOP powder Take 255 g by mouth once for 1 dose. 11/05/21 11/05/21 Yes Regan Lemming, MD   senna-docusate (SENOKOT-S) 8.6-50 MG tablet Take 1 tablet by mouth daily. 11/05/21 12/05/21 Yes Regan Lemming, MD  amLODipine (NORVASC) 5 MG tablet Take 5 mg by mouth daily. 03/17/15   [provider]  furosemide (LASIX) 20 MG tablet Take 1 tablet (20 mg total) by mouth daily. 05/20/15   Hosie Poisson, MD  GLEEVEC 400 MG tablet TAKE 1 TABLET (400 MG TOTAL) BY MOUTH DAILY. TAKE WITH MEAL AND LARGE GLASS OF WATER. 06/17/21 06/17/22  Ladell Pier, MD  loperamide (IMODIUM) 2 MG capsule Take 1 capsule (2 mg total) by mouth 4 (four) times daily as needed for diarrhea or loose stools. 10/27/21   Tonye Pearson, PA-C  metoprolol tartrate (LOPRESSOR) 25 MG tablet Take 1 tablet (25 mg total) by mouth 2 (two) times daily. 03/06/13   Caren Griffins, MD  Potassium Chloride (KLOR-CON 10 PO) Take 10 mEq by mouth daily.    [provider]  simvastatin (ZOCOR) 20 MG tablet Take 20 mg by mouth daily. 10/21/20   [provider]  sulfamethoxazole-trimethoprim (BACTRIM DS) 800-160 MG tablet Take 1 tablet by mouth 2 (two) times daily. 06/02/21   [provider]  tamsulosin (FLOMAX) 0.4 MG CAPS capsule Take 0.4 mg by mouth daily. 02/04/20   [provider]    Allergies    Patient has no known allergies.  Review of Systems   Review of Systems  Constitutional:  Negative for chills and fever.  HENT:  Negative for ear pain and sore throat.   Eyes:  Negative for pain and visual disturbance.  Respiratory:  Negative for cough and shortness of breath.   Cardiovascular:  Negative for chest pain and palpitations.  Gastrointestinal:  Positive for abdominal pain, constipation and rectal pain. Negative for diarrhea, nausea and vomiting.  Genitourinary:  Negative for dysuria and hematuria.  Musculoskeletal:  Negative for arthralgias and back pain.  Skin:  Negative for color change and rash.  Neurological:  Negative for seizures and syncope.  All other systems reviewed and are  negative.  Physical Exam Updated Vital Signs BP (!) 143/74   Pulse 89   Temp 97.7 F (36.5 C) (Oral)   Resp 18   Ht 5\' 7"  (1.702 m)   Wt 72.4 kg   SpO2 99%   BMI 25.00 kg/m   Physical Exam Vitals and nursing note reviewed.  Constitutional:      General: He is not in acute distress.    Appearance: He is well-developed.  HENT:     Head: Normocephalic and atraumatic.  Eyes:     Conjunctiva/sclera: Conjunctivae normal.  Cardiovascular:     Rate and Rhythm: Normal rate and regular rhythm.     Heart sounds: No murmur heard. Pulmonary:     Effort: Pulmonary effort is normal. No respiratory distress.  Breath sounds: Normal breath sounds.  Abdominal:     Palpations: Abdomen is soft.     Tenderness: There is no abdominal tenderness.  Genitourinary:    Comments: Deferred as patient was having a large bowel movement on reassessment Musculoskeletal:        General: No swelling.     Cervical back: Neck supple.  Skin:    General: Skin is warm and dry.     Capillary Refill: Capillary refill takes less than 2 seconds.  Neurological:     Mental Status: He is alert.  Psychiatric:        Mood and Affect: Mood normal.    ED Results / Procedures / Treatments   Labs (all labs ordered are listed, but only abnormal results are displayed) Labs Reviewed  CBC WITH DIFFERENTIAL/PLATELET - Abnormal; Notable for the following components:      Result Value   RBC 2.79 (*)    Hemoglobin 10.3 (*)    HCT 29.5 (*)    MCV 105.7 (*)    MCH 36.9 (*)    All other components within normal limits  COMPREHENSIVE METABOLIC PANEL - Abnormal; Notable for the following components:   Potassium 3.3 (*)    Glucose, Bld 130 (*)    BUN 41 (*)    Creatinine, Ser 2.33 (*)    Calcium 8.3 (*)    Total Protein 6.3 (*)    GFR, Estimated 26 (*)    All other components within normal limits  LIPASE, BLOOD - Abnormal; Notable for the following components:   Lipase 90 (*)    All other components within  normal limits  LACTIC ACID, PLASMA  MAGNESIUM    EKG None  Radiology CT ABDOMEN PELVIS WO CONTRAST  Result Date: 11/05/2021 CLINICAL DATA:  Bowel obstruction suspected, history of GIST, resected EXAM: CT ABDOMEN AND PELVIS WITHOUT CONTRAST TECHNIQUE: Multidetector CT imaging of the abdomen and pelvis was performed following the standard protocol without IV contrast. COMPARISON:  10/27/2021 FINDINGS: Lower chest: No acute abnormality.  Coronary artery calcifications. Hepatobiliary: No solid liver abnormality is seen. Multiple gallstones in the gallbladder and central common bile duct, as seen on prior examination. No gallbladder wall thickening, or biliary dilatation. Pancreas: Unremarkable. No pancreatic ductal dilatation or surrounding inflammatory changes. Spleen: Normal in size without significant abnormality. Adrenals/Urinary Tract: Adrenal glands are unremarkable. Kidneys are normal, without renal calculi, solid lesion, or hydronephrosis. Bladder is unremarkable. Stomach/Bowel: Stomach is within normal limits. Status post distal ileal resection reanastomosis, appearance and configuration unchanged compared to recent prior examination. Descending and sigmoid diverticulosis. Fluid present in the colon to the rectum, stool ball present in the rectal vault (series 2, image 68). Vascular/Lymphatic: Aortic atherosclerosis. No enlarged abdominal or pelvic lymph nodes. Reproductive: No mass or other significant abnormality. Other: Unchanged large right inguinal hernia containing nonobstructed distal small bowel (series 2, image 78). No abdominopelvic ascites. Musculoskeletal: No acute or significant osseous findings. IMPRESSION: 1. Status post distal ileal resection and reanastomosis, appearance and configuration unchanged compared to recent prior examination. No evidence of bowel obstruction. 2. Fluid present in the colon to the rectum,, in keeping with diarrheal illness. Stool ball present in the rectal  vault. 3. Unchanged large right inguinal hernia containing nonobstructed distal small bowel. 4. Cholelithiasis and multiple small gallstones in the central common bile duct, as seen on prior examinations. No biliary ductal dilatation. 5. Coronary artery disease. Aortic Atherosclerosis (ICD10-I70.0). Electronically Signed   By: Delanna Ahmadi M.D.   On: 11/05/2021 10:52  DG Abdomen 1 View  Result Date: 11/05/2021 CLINICAL DATA:  Abdominal pain, constipation EXAM: ABDOMEN - 1 VIEW COMPARISON:  Previous studies including the CT done on 11/05/2021 FINDINGS: Stomach is not distended. There is no significant small bowel dilation. There is moderate gaseous distention of ascending and transverse colon. Small amount of stool is seen in the rectum. IMPRESSION: There is no small bowel dilation. Moderate gaseous distention of right colon may suggest ileus. Small amount of stool is seen in the rectum without signs of fecal impaction. Electronically Signed   By: Elmer Picker M.D.   On: 11/05/2021 10:01    Procedures Procedures   Medications Ordered in ED Medications  Glycerin (Adult) 2 g suppository 1 suppository (1 suppository Rectal Not Given 11/05/21 1150)  potassium chloride SA (KLOR-CON) CR tablet 20 mEq (has no administration in time range)  HYDROmorphone (DILAUDID) injection 1 mg (1 mg Intravenous Given 11/05/21 0934)  0.9 %  sodium chloride infusion (0 mLs Intravenous Stopped 11/05/21 1051)  ondansetron (ZOFRAN) injection 4 mg (4 mg Intravenous Given 11/05/21 0933)  sodium chloride 0.9 % bolus 500 mL (0 mLs Intravenous Stopped 11/05/21 1145)    ED Course  I have reviewed the triage vital signs and the nursing notes.  Pertinent labs & imaging results that were available during my care of the patient were reviewed by me and considered in my medical decision making (see chart for details).    MDM Rules/Calculators/A&P                            86 year old male with a history of GI  stromal tumor status postresection, HLD, HTN presenting to the emergency department with generalized abdominal pain and rectal pain for the last 3 days.  The patient states that he feels he is constipated.  He has not had a bowel movement the past 3 days.  He states he is passing gas up until today.  He has been trying Dulcolax and MiraLAX at home without significant relief.  He states that his rectal pain is sharp and nonradiating.  Nothing makes it better or worse.  He denies any nausea or vomiting.  With presenting history and physical exam, considered small bowel obstruction versus ileus versus constipation versus diverticulosis/diverticulitis versus fecal impaction.  Patient on exam without peritoneal signs, no abdominal tenderness to palpation.  Vital stable on arrival, afebrile, not tachycardic or tachypneic, mildly hypertensive BP 141/67.  Patient has been having some rectal pain and constipation over the past 5 days with rectal pain over the last 3 days.  Considered fecal impaction.  KUB generally unremarkable.  A  CT of the abdomen pelvis was performed which revealed a large stool burden in the rectosigmoid colon.  Patient had an elevated lipase to 90 with no epigastric tenderness.  Normal lactic acid, normal magnesium, CMP with mild hypokalemia to 3.3, AKI on CKD with a creatinine of 2.33 from a baseline of around 1.7-1.9.  CBC without a leukocytosis, stable anemia to 10.3.  The patient CT scan showed no evidence of valve structure ileus.  Fluid was present in the colon to the rectum in keeping with a diarrheal illness with a stool ball present in the rectal vault.  Right inguinal hernia containing nonobstructed distal small bowel.  Cholelithiasis and multiple small gallstones in the CBD with no biliary ductal dilatation.  Reassessment, plan had been for fecal disimpaction with a stool volume noted in the patient's rectum, however following  fluid resuscitation, the patient began to have a bowel  movement on his own and passed a large stool in the emergency department with subsequent relief of symptoms.  Patient was advised to try MiraLAX at home for continued symptoms of constipation, senna and glycerin suppositories as needed.  Overall stable for discharge.  Advised the patient to follow-up with his PCP in 3 days for a recheck of his renal function, continued oral rehydration advised.   Presentation more consistent with Constipation, AKI.    Final Clinical Impression(s) / ED Diagnoses Final diagnoses:  Constipation, unspecified constipation type  AKI (acute kidney injury) ([place])    Rx / DC Orders ED Discharge Orders          Ordered    senna-docusate (SENOKOT-S) 8.6-50 MG tablet  Daily        11/05/21 1153    polyethylene glycol powder (GLYCOLAX/MIRALAX) 17 GM/SCOOP powder   Once        11/05/21 1153    glycerin adult 2 g suppository  As needed        11/05/21 1154             Regan Lemming, MD 11/05/21 1201

## 2021-11-05 NOTE — Discharge Instructions (Addendum)
Your kidney function is decreased compared to your baseline kidney function.  Recommend continued oral hydration over the next 2 days and a recheck of your kidney function with your primary doctor.  Your constipation has resolved with a large bowel movement in the emergency department.  Recommend MiraLAX daily for the next 3 days.  Recommend 8 capfuls of MiraLAX in a 32 ounce Gatorade or other electrolyte solution, drink over 4 hours with the expectation that you will have a bowel movement within 24 hours.  Following that 1 capful daily of MiraLAX for maintenance is recommended.

## 2021-11-05 NOTE — ED Triage Notes (Signed)
Pt BIB [facility] from home. Pt c/o abdominal pain and rectal pain for 3 days. Pt states he has not had a bowel movement in 5 days. Pt has taken Doculax and Mirslax without relief.   Vitals were:  142/68 98.2 Temp 98% RA 88 HR

## 2021-11-16 DIAGNOSIS — H6121 Impacted cerumen, right ear: Secondary | ICD-10-CM | POA: Diagnosis not present

## 2021-11-16 DIAGNOSIS — G4701 Insomnia due to medical condition: Secondary | ICD-10-CM | POA: Diagnosis not present

## 2021-11-22 ENCOUNTER — Other Ambulatory Visit: Payer: Self-pay | Admitting: *Deleted

## 2021-11-22 ENCOUNTER — Telehealth: Payer: Self-pay | Admitting: Pharmacy Technician

## 2021-11-22 DIAGNOSIS — C49A3 Gastrointestinal stromal tumor of small intestine: Secondary | ICD-10-CM

## 2021-11-22 MED ORDER — GLEEVEC 400 MG PO TABS: ORAL_TABLET | ORAL | 5 refills | Status: DC

## 2021-12-14 ENCOUNTER — Other Ambulatory Visit: Payer: Self-pay

## 2021-12-14 ENCOUNTER — Inpatient Hospital Stay (HOSPITAL_BASED_OUTPATIENT_CLINIC_OR_DEPARTMENT_OTHER): Payer: Medicare Other | Admitting: Oncology

## 2021-12-14 ENCOUNTER — Inpatient Hospital Stay: Payer: Medicare Other | Attending: Nurse Practitioner

## 2021-12-14 VITALS — BP 134/59 | HR 72 | Temp 98.1°F | Resp 20 | Ht 67.0 in | Wt 156.0 lb

## 2021-12-14 DIAGNOSIS — N289 Disorder of kidney and ureter, unspecified: Secondary | ICD-10-CM | POA: Insufficient documentation

## 2021-12-14 DIAGNOSIS — D649 Anemia, unspecified: Secondary | ICD-10-CM | POA: Diagnosis not present

## 2021-12-14 DIAGNOSIS — D539 Nutritional anemia, unspecified: Secondary | ICD-10-CM

## 2021-12-14 DIAGNOSIS — C49A3 Gastrointestinal stromal tumor of small intestine: Secondary | ICD-10-CM | POA: Insufficient documentation

## 2021-12-14 DIAGNOSIS — C786 Secondary malignant neoplasm of retroperitoneum and peritoneum: Secondary | ICD-10-CM | POA: Diagnosis not present

## 2021-12-14 DIAGNOSIS — I252 Old myocardial infarction: Secondary | ICD-10-CM | POA: Insufficient documentation

## 2021-12-14 LAB — CBC WITH DIFFERENTIAL (CANCER CENTER ONLY)
Abs Immature Granulocytes: 0.02 10*3/uL (ref 0.00–0.07)
Basophils Absolute: 0 10*3/uL (ref 0.0–0.1)
Basophils Relative: 0 %
Eosinophils Absolute: 0.7 10*3/uL — ABNORMAL HIGH (ref 0.0–0.5)
Eosinophils Relative: 13 %
HCT: 25 % — ABNORMAL LOW (ref 39.0–52.0)
Hemoglobin: 8.3 g/dL — ABNORMAL LOW (ref 13.0–17.0)
Immature Granulocytes: 0 %
Lymphocytes Relative: 14 %
Lymphs Abs: 0.8 10*3/uL (ref 0.7–4.0)
MCH: 35.8 pg — ABNORMAL HIGH (ref 26.0–34.0)
MCHC: 33.2 g/dL (ref 30.0–36.0)
MCV: 107.8 fL — ABNORMAL HIGH (ref 80.0–100.0)
Monocytes Absolute: 0.5 10*3/uL (ref 0.1–1.0)
Monocytes Relative: 8 %
Neutro Abs: 3.8 10*3/uL (ref 1.7–7.7)
Neutrophils Relative %: 65 %
Platelet Count: 211 10*3/uL (ref 150–400)
RBC: 2.32 MIL/uL — ABNORMAL LOW (ref 4.22–5.81)
RDW: 13.3 % (ref 11.5–15.5)
WBC Count: 5.9 10*3/uL (ref 4.0–10.5)
nRBC: 0 % (ref 0.0–0.2)

## 2021-12-14 LAB — CMP (CANCER CENTER ONLY)
ALT: 12 U/L (ref 0–44)
AST: 15 U/L (ref 15–41)
Albumin: 3.6 g/dL (ref 3.5–5.0)
Alkaline Phosphatase: 33 U/L — ABNORMAL LOW (ref 38–126)
Anion gap: 8 (ref 5–15)
BUN: 36 mg/dL — ABNORMAL HIGH (ref 8–23)
CO2: 20 mmol/L — ABNORMAL LOW (ref 22–32)
Calcium: 8.4 mg/dL — ABNORMAL LOW (ref 8.9–10.3)
Chloride: 114 mmol/L — ABNORMAL HIGH (ref 98–111)
Creatinine: 1.9 mg/dL — ABNORMAL HIGH (ref 0.61–1.24)
GFR, Estimated: 33 mL/min — ABNORMAL LOW (ref >60)
Glucose, Bld: 103 mg/dL — ABNORMAL HIGH (ref 70–99)
Potassium: 4.5 mmol/L (ref 3.5–5.1)
Sodium: 142 mmol/L (ref 135–145)
Total Bilirubin: 0.4 mg/dL (ref 0.3–1.2)
Total Protein: 5.7 g/dL — ABNORMAL LOW (ref 6.5–8.1)

## 2021-12-14 LAB — FERRITIN: Ferritin: 83 ng/mL (ref 24–336)

## 2021-12-14 LAB — VITAMIN B12: Vitamin B-12: 169 pg/mL — ABNORMAL LOW (ref 180–914)

## 2021-12-14 NOTE — Progress Notes (Signed)
°[facility] °OFFICE PROGRESS NOTE ° ° °Diagnosis: Gastrointestinal stromal tumor ° °INTERVAL HISTORY:  ° °[name] returns as scheduled.  He feels well.  No dyspnea.  No bleeding.  He had several days of diarrhea recently.  This has resolved. ° °Objective: ° °Vital signs in last 24 hours: ° °Blood pressure (!) 134/59, pulse 72, temperature 98.1 °F (36.7 °C), temperature source Oral, resp. rate 20, height 5' 7" (1.702 m), weight 156 lb (70.8 kg), SpO2 100 %. °  ° °HEENT: No thrush or ulcers, mild periorbital edema °Resp: Lungs clear bilaterally °Cardio: Regular rate and rhythm °GI: No hepatosplenomegaly, no mass, right inguinal hernia °Vascular: No leg edema ° ° °Lab Results: ° °Lab Results  °Component Value Date  ° WBC 5.9 12/14/2021  ° HGB 8.3 (L) 12/14/2021  ° HCT 25.0 (L) 12/14/2021  ° MCV 107.8 (H) 12/14/2021  ° PLT 211 12/14/2021  ° NEUTROABS 3.8 12/14/2021  ° ° °CMP  °Lab Results  °Component Value Date  ° NA 142 12/14/2021  ° K 4.5 12/14/2021  ° CL 114 (H) 12/14/2021  ° CO2 20 (L) 12/14/2021  ° GLUCOSE 103 (H) 12/14/2021  ° BUN 36 (H) 12/14/2021  ° CREATININE 1.90 (H) 12/14/2021  ° CALCIUM 8.4 (L) 12/14/2021  ° PROT 5.7 (L) 12/14/2021  ° ALBUMIN 3.6 12/14/2021  ° AST 15 12/14/2021  ° ALT 12 12/14/2021  ° ALKPHOS 33 (L) 12/14/2021  ° BILITOT 0.4 12/14/2021  ° GFRNONAA 33 (L) 12/14/2021  ° GFRAA 51 (L) 08/05/2020  ° ° ° °Medications: I have reviewed the patient's current medications. ° ° °Assessment/Plan: °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 °CT 10/16/2014 consistent with extensive carcinomatosis, CT-guided biopsy of an omental mass 10/21/2014 confirmed a gastrointestinal stromal tumor °Initiation of Gleevec 10/31/2014 °CT 01/13/2015 revealed improvement in the peritoneal and omental metastatic disease °CT 05/05/2015 with no progression of omental/peritoneal metastatic disease, increased ascites, and appendix inflammation °CT 07/07/2016-no  abscess, mild perihepatic and left pericolic ascites and mesenteric edema °CT of the pelvis 09/20/2016, no residual abscess, no ascites °CT abdomen pelvis renal stones study 04/03/2017-partial small bowel obstruction, potentially related to right inguinal hernia °CT abdomen/pelvis 08/18/2017-possible cholecystitis, 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  Cholelithiasis. °CT renal stone study 06/04/2021-large right and small left inguinal hernias, no ascites °  °2. History of anemia, status post a nondiagnostic bone marrow biopsy 10/21/2014 °  °3. NSTEMI March 2014 °  °4. Admission 05/06/2015 with acute onset right abdomen pain-potentially related to acute appendicitis versus pain from carcinomatosis °CT 05/13/2015 consistent with a right lower abdomen abscess, status post catheter drainage by interventional radiology 05/13/2015 °Follow-up CT 05/19/2015 showed resolution of the abscess. °Follow-up evaluation in interventional radiology 06/01/2015 showed the abscess cavity had resolved. The abscess drainage catheter communicated with the cecum via the appendix. The catheter was left in place. °CT abdomen/pelvis 06/08/2015 showed the right pelvic drain in place without recurrent or residual surrounding fluid collection. Peritoneal metastasis with slight increase in small to moderate volume of abdominal pelvic ascites. °Drainage catheter injection 06/12/2015 showed residual tiny fistulous connection with the end of the decompressed abscess cavity within the right lower abdominal quadrant and the residual lumen of the appendix. °  Paracentesis 06/12/2015 with 5 liters of fluid  removed °Paracentesis 06/19/2015-negative cytology and culture °Removal of abscess drainage catheter 06/23/2015 °CT 07/26/2015 with increased right abdominal wall inflammation and increased fluid collection at site of previous right abdomen abscess, ascites, and enlargement/inflammation of the appendix °Placement of right lower quadrant percutaneous drainage catheter 07/27/2015. Contrast injection confirmed persistent fistulous connection with the ill-defined fluid collection and the residual appendix and cecum. Paracentesis with 2 L of fluid removed. °Follow-up CT abdomen/pelvis 08/05/2015 with small to moderate left pleural effusion and moderate to large volume ascites with both appearing slightly decreased since the prior study. Right lower quadrant drainage catheter in place. No residual fluid collection around the drain or in the adjacent abdomen or pelvis. 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 Drainage catheter left in place. 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 Ingram °  °  °5. C. difficile colitis 05/10/2015, [place] 2019 °  °6.  Right leg edema 12/18/2015. Negative venous Doppler. °  °7.  Renal insufficiency ° °8.  Urinary retention June 2022-temporary Foley catheter placed, started on Flomax °  ° ° ° ° °Disposition: °[name] appears stable.  The hemoglobin is lower today.  The anemia may be related to Gleevec and renal insufficiency.  He has a history of a low vitamin B12 level in 2021.  He is not on vitamin B12 replacement.  We will check a vitamin B12 and  ferritin today.  He will return for an office visit and CBC in 2-3 weeks.  He will call for symptoms of anemia. ° °[name] Sherrill, MD ° °12/14/2021  °10:59 AM ° ° °

## 2021-12-15 ENCOUNTER — Telehealth: Payer: Self-pay

## 2021-12-15 NOTE — Telephone Encounter (Signed)
Pt verbalized understanding.

## 2021-12-15 NOTE — Telephone Encounter (Signed)
-----   Message from Ladell Pier, MD sent at 12/15/2021  8:47 AM EST ----- Please call patient, vitamin B12 is low, this may explain the progressive anemia, start vitamin B12 1000 mcg daily, follow-up as scheduled

## 2021-12-28 ENCOUNTER — Other Ambulatory Visit: Payer: Self-pay | Admitting: *Deleted

## 2021-12-28 DIAGNOSIS — C49A3 Gastrointestinal stromal tumor of small intestine: Secondary | ICD-10-CM

## 2021-12-28 MED ORDER — IMATINIB MESYLATE 400 MG PO TABS
400.0000 mg | ORAL_TABLET | Freq: Every day | ORAL | 3 refills | Status: DC
Start: 2021-12-28 — End: 2022-04-21

## 2021-12-28 NOTE — Telephone Encounter (Signed)
Notified by Bethena Roys w/oral chemotherapy program that [facility] needs a new [place] script faxed. Drug still provided by [employer].  New script sent.

## 2021-12-30 ENCOUNTER — Encounter: Payer: Self-pay | Admitting: Nurse Practitioner

## 2021-12-30 ENCOUNTER — Other Ambulatory Visit: Payer: Self-pay

## 2021-12-30 ENCOUNTER — Inpatient Hospital Stay: Payer: Medicare Other

## 2021-12-30 ENCOUNTER — Inpatient Hospital Stay: Payer: Medicare Other | Attending: Nurse Practitioner | Admitting: Nurse Practitioner

## 2021-12-30 VITALS — BP 157/62 | HR 58 | Temp 97.8°F | Resp 18 | Ht 67.0 in | Wt 162.2 lb

## 2021-12-30 DIAGNOSIS — C786 Secondary malignant neoplasm of retroperitoneum and peritoneum: Secondary | ICD-10-CM | POA: Diagnosis not present

## 2021-12-30 DIAGNOSIS — E538 Deficiency of other specified B group vitamins: Secondary | ICD-10-CM | POA: Diagnosis not present

## 2021-12-30 DIAGNOSIS — N289 Disorder of kidney and ureter, unspecified: Secondary | ICD-10-CM | POA: Insufficient documentation

## 2021-12-30 DIAGNOSIS — I252 Old myocardial infarction: Secondary | ICD-10-CM | POA: Diagnosis not present

## 2021-12-30 DIAGNOSIS — D649 Anemia, unspecified: Secondary | ICD-10-CM

## 2021-12-30 DIAGNOSIS — A0472 Enterocolitis due to Clostridium difficile, not specified as recurrent: Secondary | ICD-10-CM | POA: Diagnosis not present

## 2021-12-30 DIAGNOSIS — D539 Nutritional anemia, unspecified: Secondary | ICD-10-CM

## 2021-12-30 DIAGNOSIS — C49A3 Gastrointestinal stromal tumor of small intestine: Secondary | ICD-10-CM | POA: Insufficient documentation

## 2021-12-30 LAB — CBC WITH DIFFERENTIAL (CANCER CENTER ONLY)
Abs Immature Granulocytes: 0.02 10*3/uL (ref 0.00–0.07)
Basophils Absolute: 0 10*3/uL (ref 0.0–0.1)
Basophils Relative: 0 %
Eosinophils Absolute: 1.9 10*3/uL — ABNORMAL HIGH (ref 0.0–0.5)
Eosinophils Relative: 25 %
HCT: 25.9 % — ABNORMAL LOW (ref 39.0–52.0)
Hemoglobin: 8.3 g/dL — ABNORMAL LOW (ref 13.0–17.0)
Immature Granulocytes: 0 %
Lymphocytes Relative: 16 %
Lymphs Abs: 1.2 10*3/uL (ref 0.7–4.0)
MCH: 35.5 pg — ABNORMAL HIGH (ref 26.0–34.0)
MCHC: 32 g/dL (ref 30.0–36.0)
MCV: 110.7 fL — ABNORMAL HIGH (ref 80.0–100.0)
Monocytes Absolute: 0.6 10*3/uL (ref 0.1–1.0)
Monocytes Relative: 8 %
Neutro Abs: 3.9 10*3/uL (ref 1.7–7.7)
Neutrophils Relative %: 51 %
Platelet Count: 204 10*3/uL (ref 150–400)
RBC: 2.34 MIL/uL — ABNORMAL LOW (ref 4.22–5.81)
RDW: 14.4 % (ref 11.5–15.5)
WBC Count: 7.5 10*3/uL (ref 4.0–10.5)
nRBC: 0 % (ref 0.0–0.2)

## 2021-12-30 LAB — RETIC PANEL
Immature Retic Fract: 9.5 % (ref 2.3–15.9)
RBC.: 2.31 MIL/uL — ABNORMAL LOW (ref 4.22–5.81)
Retic Count, Absolute: 49.4 10*3/uL (ref 19.0–186.0)
Retic Ct Pct: 2.1 % (ref 0.4–3.1)
Reticulocyte Hemoglobin: 37.4 pg (ref >27.9)

## 2021-12-30 LAB — DIRECT ANTIGLOBULIN TEST (NOT AT ARMC)
DAT, IgG: NEGATIVE
DAT, complement: NEGATIVE

## 2021-12-30 LAB — SAMPLE TO BLOOD BANK

## 2021-12-30 MED ORDER — CYANOCOBALAMIN 1000 MCG/ML IJ SOLN
1000.0000 ug | Freq: Once | INTRAMUSCULAR | Status: AC
  Administered 2021-12-30: 1000 ug via SUBCUTANEOUS
  Filled 2021-12-30: qty 1

## 2021-12-30 NOTE — Telephone Encounter (Signed)
Oral Oncology Patient Advocate Encounter  Received communication from [employer] that the patient's eligibility in the patient assistance program for [place] was due for re-enrollment.    Completed application signed by patient and provider.  The renewal application has been completed and faxed in an effort to keep the patient's out of pocket expense for Gleevec at $0.     Application completed and faxed to [phone] on 11/22/21.    [employer] Patient [place] phone number for follow up is [phone].    This encounter will be updated until final determination.  [place] Patient [place] Phone [phone] Fax [phone]

## 2021-12-30 NOTE — Telephone Encounter (Signed)
Oral Oncology Patient Advocate Encounter  Received notification from [facility] that patient has been successfully enrolled into their program to receive Gleevec from the manufacturer at $0 out of pocket until 12/18/22.    I called and spoke with patient.  He knows we will have to re-apply.   Specialty Pharmacy that will dispense medication is [facility].  Patient knows to call the office with questions or concerns.   Oral Oncology Clinic will continue to follow.  [place] Patient [place] Phone [phone] Fax [phone] 12/30/2021 2:35 PM

## 2021-12-30 NOTE — Progress Notes (Signed)
[place] OFFICE PROGRESS NOTE   Diagnosis: Gastrointestinal stromal tumor  INTERVAL HISTORY:   [name] [name] returns as scheduled.  The B12 level returned low 2 weeks ago.  He reports beginning B12 1000 mcg daily 12/15/2021.  He overall feels well.  He denies bleeding.  No shortness of breath.  Objective:  Vital signs in last 24 hours:  Blood pressure (!) 157/62, pulse (!) 58, temperature 97.8 F (36.6 C), temperature source Oral, resp. rate 18, height 5' 7"  (1.702 m), weight 162 lb 3.2 oz (73.6 kg), SpO2 96 %.    HEENT: Mild periorbital edema.  No thrush or ulcers. Resp: Lungs clear bilaterally. Cardio: Regular rate and rhythm. GI: No hepatosplenomegaly.  No mass. Vascular: No leg edema.  Lab Results:  Lab Results  Component Value Date   WBC 7.5 12/30/2021   HGB 8.3 (L) 12/30/2021   HCT 25.9 (L) 12/30/2021   MCV 110.7 (H) 12/30/2021   PLT 204 12/30/2021   NEUTROABS 3.9 12/30/2021    Imaging:  No results found.  Medications: I have reviewed the patient's current medications.  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 CT 10/16/2014 consistent with extensive carcinomatosis, CT-guided biopsy of an omental mass 10/21/2014 confirmed a gastrointestinal stromal tumor Initiation of Gleevec 10/31/2014 CT 01/13/2015 revealed improvement in the peritoneal and omental metastatic disease CT 05/05/2015 with no progression of omental/peritoneal metastatic disease, increased ascites, and appendix inflammation CT 07/07/2016-no abscess, mild perihepatic and left pericolic ascites and mesenteric edema CT of the pelvis 09/20/2016, no residual abscess, no ascites CT abdomen pelvis renal stones study 04/03/2017-partial small bowel obstruction, potentially related to right inguinal hernia CT abdomen/pelvis 08/18/2017-possible cholecystitis, 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  Cholelithiasis. CT renal stone study 06/04/2021-large right and small left inguinal hernias, no ascites   2. History of anemia, status post a nondiagnostic bone marrow biopsy 10/21/2014   3. NSTEMI March 2014   4. Admission 05/06/2015 with acute onset right abdomen pain-potentially related to acute appendicitis versus pain from carcinomatosis CT 05/13/2015 consistent with a right lower abdomen abscess, status post catheter drainage by interventional radiology 05/13/2015 Follow-up CT 05/19/2015 showed resolution of the abscess. Follow-up evaluation in interventional radiology 06/01/2015 showed the abscess cavity had resolved. The abscess drainage catheter communicated with the cecum via the appendix. The catheter was left in place. CT abdomen/pelvis 06/08/2015 showed the right pelvic drain in place without recurrent or residual surrounding fluid collection. Peritoneal metastasis with slight increase in small to moderate volume of abdominal pelvic ascites. Drainage catheter injection 06/12/2015 showed residual tiny fistulous connection with the end of the decompressed abscess cavity within the right lower abdominal quadrant and the residual lumen of the appendix. Paracentesis 06/12/2015 with 5 liters of fluid removed Paracentesis 06/19/2015-negative cytology and culture Removal of abscess drainage catheter 06/23/2015 CT 07/26/2015 with increased right abdominal wall inflammation and increased fluid collection at site of previous right abdomen abscess, ascites, and enlargement/inflammation of the appendix Placement of right lower quadrant percutaneous drainage catheter 07/27/2015. Contrast  injection confirmed persistent fistulous connection with the ill-defined fluid collection and the residual appendix and cecum. Paracentesis with 2 L of fluid removed. Follow-up CT abdomen/pelvis 08/05/2015 with small to moderate  left pleural effusion and moderate to large volume ascites with both appearing slightly decreased since the prior study. Right lower quadrant drainage catheter in place. No residual fluid collection around the drain or in the adjacent abdomen or pelvis. 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 Drainage catheter left in place. 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 [name]     5. C. difficile colitis 05/10/2015, [facility] Cranker 2019   6.  Right leg edema 12/18/2015. Negative venous Doppler.   7.  Renal insufficiency   8.  Urinary retention June 2022-temporary Foley catheter placed, started on Flomax  9.  B12 deficiency-oral B12 initiated 12/15/2021      Disposition: [name] [name] appears stable.  He was found to have B12 deficiency following the last office visit.  He began oral B12 about 2 weeks ago.  CBC from today shows stable macrocytic anemia.  We will give a dose of B12 subcutaneously today.  He will continue oral B12.  Add reticulocyte count and DAT to today's blood work.  Lab and follow-up in approximately 3 weeks.  Plan reviewed with Dr. [name].    Ned Card ANP/GNP-BC   12/30/2021  8:55 AM

## 2021-12-30 NOTE — Patient Instructions (Signed)
Vitamin B12 Injection °What is this medication? °Vitamin B12 (VAHY tuh min B12) prevents and treats low vitamin B12 levels in your body. It is used in people who do not get enough vitamin B12 from their diet or when their digestive tract does not absorb enough. Vitamin B12 plays an important role in maintaining the health of your nervous system and red blood cells. °This medicine may be used for other purposes; ask your health care provider or pharmacist if you have questions. °COMMON BRAND NAME(S): B-12 Compliance Kit, B-12 Injection Kit, Cyomin, Dodex, LA-12, Nutri-Twelve, Physicians EZ Use B-12, Primabalt °What should I tell my care team before I take this medication? °They need to know if you have any of these conditions: °Kidney disease °Leber's disease °Megaloblastic anemia °An unusual or allergic reaction to cyanocobalamin, cobalt, other medications, foods, dyes, or preservatives °Pregnant or trying to get pregnant °Breast-feeding °How should I use this medication? °This medication is injected into a muscle or deeply under the skin. It is usually given in a clinic or care team's office. However, your care team may teach you how to inject yourself. Follow all instructions. °Talk to your care team about the use of this medication in children. Special care may be needed. °Overdosage: If you think you have taken too much of this medicine contact a poison control center or emergency room at once. °NOTE: This medicine is only for you. Do not share this medicine with others. °What if I miss a dose? °If you are given your dose at a clinic or care team's office, call to reschedule your appointment. If you give your own injections, and you miss a dose, take it as soon as you can. If it is almost time for your next dose, take only that dose. Do not take double or extra doses. °What may interact with this medication? °Colchicine °Heavy alcohol intake °This list may not describe all possible interactions. Give your health  care provider a list of all the medicines, herbs, non-prescription drugs, or dietary supplements you use. Also tell them if you smoke, drink alcohol, or use illegal drugs. Some items may interact with your medicine. °What should I watch for while using this medication? °Visit your care team regularly. You may need blood work done while you are taking this medication. °You may need to follow a special diet. Talk to your care team. Limit your alcohol intake and avoid smoking to get the best benefit. °What side effects may I notice from receiving this medication? °Side effects that you should report to your care team as soon as possible: °Allergic reactions--skin rash, itching, hives, swelling of the face, lips, tongue, or throat °Swelling of the ankles, hands, or feet °Trouble breathing °Side effects that usually do not require medical attention (report to your care team if they continue or are bothersome): °Diarrhea °This list may not describe all possible side effects. Call your doctor for medical advice about side effects. You may report side effects to FDA at 1-800-FDA-1088. °Where should I keep my medication? °Keep out of the reach of children. °Store at room temperature between 15 and 30 degrees C (59 and 85 degrees F). Protect from light. Throw away any unused medication after the expiration date. °NOTE: This sheet is a summary. It may not cover all possible information. If you have questions about this medicine, talk to your doctor, pharmacist, or health care provider. °© 2022 [employer]vier/Gold Standard (2021-02-17 00:00:00) ° °

## 2022-01-10 DIAGNOSIS — H40013 Open angle with borderline findings, low risk, bilateral: Secondary | ICD-10-CM | POA: Diagnosis not present

## 2022-01-17 ENCOUNTER — Other Ambulatory Visit (HOSPITAL_COMMUNITY): Payer: Self-pay

## 2022-01-19 NOTE — Progress Notes (Deleted)
[place] OFFICE PROGRESS NOTE   Diagnosis:   INTERVAL HISTORY:   ***  Objective:  Vital signs in last 24 hours:  There were no vitals taken for this visit.    HEENT: *** Lymphatics: *** Resp: *** Cardio: *** GI: *** Vascular: *** Neuro:***  Skin:***   Portacath/PICC-without erythema  Lab Results:  Lab Results  Component Value Date   WBC 7.5 12/30/2021   HGB 8.3 (L) 12/30/2021   HCT 25.9 (L) 12/30/2021   MCV 110.7 (H) 12/30/2021   PLT 204 12/30/2021   NEUTROABS 3.9 12/30/2021    CMP  Lab Results  Component Value Date   NA 142 12/14/2021   K 4.5 12/14/2021   CL 114 (H) 12/14/2021   CO2 20 (L) 12/14/2021   GLUCOSE 103 (H) 12/14/2021   BUN 36 (H) 12/14/2021   CREATININE 1.90 (H) 12/14/2021   CALCIUM 8.4 (L) 12/14/2021   PROT 5.7 (L) 12/14/2021   ALBUMIN 3.6 12/14/2021   AST 15 12/14/2021   ALT 12 12/14/2021   ALKPHOS 33 (L) 12/14/2021   BILITOT 0.4 12/14/2021   GFRNONAA 33 (L) 12/14/2021   GFRAA 51 (L) 08/05/2020    No results found for: CEA1, CEA, [id number], CA125  Lab Results  Component Value Date   INR 1.17 07/26/2015   LABPROT 15.1 07/26/2015    Imaging:  No results found.  Medications: I have reviewed the patient's current medications.   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 CT 10/16/2014 consistent with extensive carcinomatosis, CT-guided biopsy of an omental mass 10/21/2014 confirmed a gastrointestinal stromal tumor Initiation of Gleevec 10/31/2014 CT 01/13/2015 revealed improvement in the peritoneal and omental metastatic disease CT 05/05/2015 with no progression of omental/peritoneal metastatic disease, increased ascites, and appendix inflammation CT 07/07/2016-no abscess, mild perihepatic and left pericolic ascites and mesenteric edema CT of the pelvis 09/20/2016, no residual abscess, no ascites CT abdomen pelvis renal stones study  04/03/2017-partial small bowel obstruction, potentially related to right inguinal hernia CT abdomen/pelvis 08/18/2017-possible cholecystitis, 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  Cholelithiasis. CT renal stone study 06/04/2021-large right and small left inguinal hernias, no ascites   2. History of anemia, status post a nondiagnostic bone marrow biopsy 10/21/2014   3. NSTEMI March 2014   4. Admission 05/06/2015 with acute onset right abdomen pain-potentially related to acute appendicitis versus pain from carcinomatosis CT 05/13/2015 consistent with a right lower abdomen abscess, status post catheter drainage by interventional radiology 05/13/2015 Follow-up CT 05/19/2015 showed resolution of the abscess. Follow-up evaluation in interventional radiology 06/01/2015 showed the abscess cavity had resolved. The abscess drainage catheter communicated with the cecum via the appendix. The catheter was left in place. CT abdomen/pelvis 06/08/2015 showed the right pelvic drain in place without recurrent or residual surrounding fluid collection. Peritoneal metastasis with slight increase in small to moderate volume of abdominal pelvic ascites. Drainage catheter injection 06/12/2015 showed residual tiny fistulous connection with the end of the decompressed abscess cavity within the right lower abdominal quadrant and the residual lumen of the appendix. Paracentesis 06/12/2015 with 5 liters of fluid removed Paracentesis 06/19/2015-negative cytology and culture Removal of abscess drainage catheter 06/23/2015 CT 07/26/2015 with increased right abdominal wall inflammation  and increased fluid collection at site of previous right abdomen abscess, ascites, and enlargement/inflammation of the appendix °Placement of right lower quadrant percutaneous drainage catheter 07/27/2015. Contrast injection confirmed persistent fistulous connection with the ill-defined fluid collection and the residual appendix and cecum. Paracentesis with 2 L of fluid removed. °Follow-up CT abdomen/pelvis 08/05/2015 with small to moderate left pleural effusion and moderate to large volume ascites with both appearing slightly decreased since the prior study. Right lower quadrant drainage catheter in place. No residual fluid collection around the drain or in the adjacent abdomen or pelvis. 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 Drainage catheter left in place. 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 Ingram °  °  °5. C. difficile colitis 05/10/2015, [place] 2019 °  °6.  Right leg edema 12/18/2015. Negative venous Doppler. °  °7.  Renal insufficiency °  °8.  Urinary retention June 2022-temporary Foley catheter placed, started on Flomax ° °9.  B12 deficiency-oral B12 initiated 12/15/2021 °  °  ° ° ° °Disposition: °*** ° °Gary Sherrill, MD ° °01/19/2022  °8:53 PM ° ° °

## 2022-01-20 ENCOUNTER — Other Ambulatory Visit: Payer: Self-pay

## 2022-01-20 ENCOUNTER — Inpatient Hospital Stay (HOSPITAL_BASED_OUTPATIENT_CLINIC_OR_DEPARTMENT_OTHER): Payer: Medicare Other | Admitting: Oncology

## 2022-01-20 ENCOUNTER — Inpatient Hospital Stay: Payer: Medicare Other | Attending: Nurse Practitioner

## 2022-01-20 ENCOUNTER — Encounter: Payer: Self-pay | Admitting: Oncology

## 2022-01-20 VITALS — BP 144/58 | HR 70 | Temp 98.1°F | Resp 18 | Ht 67.0 in | Wt 163.8 lb

## 2022-01-20 DIAGNOSIS — D649 Anemia, unspecified: Secondary | ICD-10-CM | POA: Diagnosis not present

## 2022-01-20 DIAGNOSIS — C49A3 Gastrointestinal stromal tumor of small intestine: Secondary | ICD-10-CM | POA: Insufficient documentation

## 2022-01-20 DIAGNOSIS — N289 Disorder of kidney and ureter, unspecified: Secondary | ICD-10-CM | POA: Insufficient documentation

## 2022-01-20 DIAGNOSIS — E538 Deficiency of other specified B group vitamins: Secondary | ICD-10-CM | POA: Diagnosis not present

## 2022-01-20 DIAGNOSIS — I252 Old myocardial infarction: Secondary | ICD-10-CM | POA: Diagnosis not present

## 2022-01-20 DIAGNOSIS — C786 Secondary malignant neoplasm of retroperitoneum and peritoneum: Secondary | ICD-10-CM | POA: Insufficient documentation

## 2022-01-20 DIAGNOSIS — D539 Nutritional anemia, unspecified: Secondary | ICD-10-CM

## 2022-01-20 LAB — SAMPLE TO BLOOD BANK

## 2022-01-20 LAB — CBC WITH DIFFERENTIAL (CANCER CENTER ONLY)
Abs Immature Granulocytes: 0.01 10*3/uL (ref 0.00–0.07)
Basophils Absolute: 0 10*3/uL (ref 0.0–0.1)
Basophils Relative: 1 %
Eosinophils Absolute: 1.8 10*3/uL — ABNORMAL HIGH (ref 0.0–0.5)
Eosinophils Relative: 33 %
HCT: 28.2 % — ABNORMAL LOW (ref 39.0–52.0)
Hemoglobin: 9 g/dL — ABNORMAL LOW (ref 13.0–17.0)
Immature Granulocytes: 0 %
Lymphocytes Relative: 16 %
Lymphs Abs: 0.9 10*3/uL (ref 0.7–4.0)
MCH: 35.4 pg — ABNORMAL HIGH (ref 26.0–34.0)
MCHC: 31.9 g/dL (ref 30.0–36.0)
MCV: 111 fL — ABNORMAL HIGH (ref 80.0–100.0)
Monocytes Absolute: 0.4 10*3/uL (ref 0.1–1.0)
Monocytes Relative: 8 %
Neutro Abs: 2.3 10*3/uL (ref 1.7–7.7)
Neutrophils Relative %: 42 %
Platelet Count: 175 10*3/uL (ref 150–400)
RBC: 2.54 MIL/uL — ABNORMAL LOW (ref 4.22–5.81)
RDW: 13.7 % (ref 11.5–15.5)
WBC Count: 5.5 10*3/uL (ref 4.0–10.5)
nRBC: 0 % (ref 0.0–0.2)

## 2022-01-20 NOTE — Progress Notes (Signed)
[place] OFFICE PROGRESS NOTE   Diagnosis: Gastrointestinal stromal tumor  INTERVAL HISTORY:   [name] [name] returns as scheduled.  He continues [place].  He is taking vitamin B12.  No diarrhea.  No new complaint.  Objective:  Vital signs in last 24 hours:  Blood pressure (!) 144/58, pulse 70, temperature 98.1 F (36.7 C), temperature source Oral, resp. rate 18, height 5' 7"  (1.702 m), weight 163 lb 12.8 oz (74.3 kg), SpO2 100 %.    HEENT: Mild white coat over the tongue, mild periorbital edema Resp: Lungs clear bilaterally Cardio: Regular rate and rhythm GI: No mass, no hepatosplenomegaly, right inguinal hernia Vascular: Trace edema at the right greater than left lower leg  Skin: No rash  Lab Results:  Lab Results  Component Value Date   WBC 5.5 01/20/2022   HGB 9.0 (L) 01/20/2022   HCT 28.2 (L) 01/20/2022   MCV 111.0 (H) 01/20/2022   PLT 175 01/20/2022   NEUTROABS 2.3 01/20/2022    CMP  Lab Results  Component Value Date   NA 142 12/14/2021   K 4.5 12/14/2021   CL 114 (H) 12/14/2021   CO2 20 (L) 12/14/2021   GLUCOSE 103 (H) 12/14/2021   BUN 36 (H) 12/14/2021   CREATININE 1.90 (H) 12/14/2021   CALCIUM 8.4 (L) 12/14/2021   PROT 5.7 (L) 12/14/2021   ALBUMIN 3.6 12/14/2021   AST 15 12/14/2021   ALT 12 12/14/2021   ALKPHOS 33 (L) 12/14/2021   BILITOT 0.4 12/14/2021   GFRNONAA 33 (L) 12/14/2021   GFRAA 51 (L) 08/05/2020     Medications: I have reviewed the patient's current medications.   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 CT 10/16/2014 consistent with extensive carcinomatosis, CT-guided biopsy of an omental mass 10/21/2014 confirmed a gastrointestinal stromal tumor Initiation of Gleevec 10/31/2014 CT 01/13/2015 revealed improvement in the peritoneal and omental metastatic disease CT 05/05/2015 with no progression of omental/peritoneal metastatic disease, increased  ascites, and appendix inflammation CT 07/07/2016-no abscess, mild perihepatic and left pericolic ascites and mesenteric edema CT of the pelvis 09/20/2016, no residual abscess, no ascites CT abdomen pelvis renal stones study 04/03/2017-partial small bowel obstruction, potentially related to right inguinal hernia CT abdomen/pelvis 08/18/2017-possible cholecystitis, 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  Cholelithiasis. CT renal stone study 06/04/2021-large right and small left inguinal hernias, no ascites   2. History of anemia, status post a nondiagnostic bone marrow biopsy 10/21/2014   3. NSTEMI March 2014   4. Admission 05/06/2015 with acute onset right abdomen pain-potentially related to acute appendicitis versus pain from carcinomatosis CT 05/13/2015 consistent with a right lower abdomen abscess, status post catheter drainage by interventional radiology 05/13/2015 Follow-up CT 05/19/2015 showed resolution of the abscess. Follow-up evaluation in interventional radiology 06/01/2015 showed the abscess cavity had resolved. The abscess drainage catheter communicated with the cecum via the appendix. The catheter was left in place. CT abdomen/pelvis 06/08/2015 showed the right pelvic drain in place without recurrent or residual surrounding fluid collection. Peritoneal metastasis with slight increase in small to moderate volume of abdominal pelvic ascites. Drainage catheter injection 06/12/2015 showed residual tiny fistulous connection with the end of the decompressed abscess cavity within the right lower  abdominal quadrant and the residual lumen of the  appendix. Paracentesis 06/12/2015 with 5 liters of fluid removed Paracentesis 06/19/2015-negative cytology and culture Removal of abscess drainage catheter 06/23/2015 CT 07/26/2015 with increased right abdominal wall inflammation and increased fluid collection at site of previous right abdomen abscess, ascites, and enlargement/inflammation of the appendix Placement of right lower quadrant percutaneous drainage catheter 07/27/2015. Contrast injection confirmed persistent fistulous connection with the ill-defined fluid collection and the residual appendix and cecum. Paracentesis with 2 L of fluid removed. Follow-up CT abdomen/pelvis 08/05/2015 with small to moderate left pleural effusion and moderate to large volume ascites with both appearing slightly decreased since the prior study. Right lower quadrant drainage catheter in place. No residual fluid collection around the drain or in the adjacent abdomen or pelvis. 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 Drainage catheter left in place. 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 [name]     5. C. difficile colitis 05/10/2015, [place] Cranker 2019   6.  Right leg edema 12/18/2015. Negative venous Doppler.   7.  Renal insufficiency   8.  Urinary retention June 2022-temporary Foley catheter placed, started on Flomax  9.  B12 deficiency-oral B12 initiated 12/15/2021     Disposition: Mr. Rushing appears stable.  He will continue oral vitamin B12.  The hemoglobin is higher today.  He will call for symptoms of anemia.  Mr. Zawislak  will return for an office visit, CBC, and vitamin B12 level in 3-4 weeks.  Betsy Coder, MD  01/20/2022  8:53 AM

## 2022-02-14 ENCOUNTER — Inpatient Hospital Stay (HOSPITAL_BASED_OUTPATIENT_CLINIC_OR_DEPARTMENT_OTHER): Payer: Medicare Other | Admitting: Oncology

## 2022-02-14 ENCOUNTER — Other Ambulatory Visit: Payer: Self-pay

## 2022-02-14 ENCOUNTER — Inpatient Hospital Stay: Payer: Medicare Other

## 2022-02-14 ENCOUNTER — Other Ambulatory Visit (HOSPITAL_COMMUNITY): Payer: Self-pay

## 2022-02-14 ENCOUNTER — Encounter: Payer: Self-pay | Admitting: Oncology

## 2022-02-14 VITALS — BP 142/59 | HR 72 | Temp 97.8°F | Resp 20 | Ht 67.0 in | Wt 166.8 lb

## 2022-02-14 DIAGNOSIS — N289 Disorder of kidney and ureter, unspecified: Secondary | ICD-10-CM | POA: Diagnosis not present

## 2022-02-14 DIAGNOSIS — D649 Anemia, unspecified: Secondary | ICD-10-CM

## 2022-02-14 DIAGNOSIS — I252 Old myocardial infarction: Secondary | ICD-10-CM | POA: Diagnosis not present

## 2022-02-14 DIAGNOSIS — C786 Secondary malignant neoplasm of retroperitoneum and peritoneum: Secondary | ICD-10-CM | POA: Diagnosis not present

## 2022-02-14 DIAGNOSIS — E538 Deficiency of other specified B group vitamins: Secondary | ICD-10-CM

## 2022-02-14 DIAGNOSIS — C49A3 Gastrointestinal stromal tumor of small intestine: Secondary | ICD-10-CM | POA: Diagnosis not present

## 2022-02-14 LAB — CBC WITH DIFFERENTIAL (CANCER CENTER ONLY)
Abs Immature Granulocytes: 0.02 10*3/uL (ref 0.00–0.07)
Basophils Absolute: 0 10*3/uL (ref 0.0–0.1)
Basophils Relative: 1 %
Eosinophils Absolute: 0.8 10*3/uL — ABNORMAL HIGH (ref 0.0–0.5)
Eosinophils Relative: 13 %
HCT: 27.9 % — ABNORMAL LOW (ref 39.0–52.0)
Hemoglobin: 9 g/dL — ABNORMAL LOW (ref 13.0–17.0)
Immature Granulocytes: 0 %
Lymphocytes Relative: 21 %
Lymphs Abs: 1.3 10*3/uL (ref 0.7–4.0)
MCH: 36.3 pg — ABNORMAL HIGH (ref 26.0–34.0)
MCHC: 32.3 g/dL (ref 30.0–36.0)
MCV: 112.5 fL — ABNORMAL HIGH (ref 80.0–100.0)
Monocytes Absolute: 0.7 10*3/uL (ref 0.1–1.0)
Monocytes Relative: 12 %
Neutro Abs: 3.2 10*3/uL (ref 1.7–7.7)
Neutrophils Relative %: 53 %
Platelet Count: 218 10*3/uL (ref 150–400)
RBC: 2.48 MIL/uL — ABNORMAL LOW (ref 4.22–5.81)
RDW: 13 % (ref 11.5–15.5)
WBC Count: 6.1 10*3/uL (ref 4.0–10.5)
nRBC: 0 % (ref 0.0–0.2)

## 2022-02-14 LAB — CMP (CANCER CENTER ONLY)
ALT: 12 U/L (ref 0–44)
AST: 18 U/L (ref 15–41)
Albumin: 3.7 g/dL (ref 3.5–5.0)
Alkaline Phosphatase: 40 U/L (ref 38–126)
Anion gap: 7 (ref 5–15)
BUN: 25 mg/dL — ABNORMAL HIGH (ref 8–23)
CO2: 24 mmol/L (ref 22–32)
Calcium: 8.5 mg/dL — ABNORMAL LOW (ref 8.9–10.3)
Chloride: 112 mmol/L — ABNORMAL HIGH (ref 98–111)
Creatinine: 1.79 mg/dL — ABNORMAL HIGH (ref 0.61–1.24)
GFR, Estimated: 36 mL/min — ABNORMAL LOW (ref >60)
Glucose, Bld: 85 mg/dL (ref 70–99)
Potassium: 4.7 mmol/L (ref 3.5–5.1)
Sodium: 143 mmol/L (ref 135–145)
Total Bilirubin: 0.4 mg/dL (ref 0.3–1.2)
Total Protein: 6 g/dL — ABNORMAL LOW (ref 6.5–8.1)

## 2022-02-14 LAB — SAMPLE TO BLOOD BANK

## 2022-02-14 LAB — VITAMIN B12: Vitamin B-12: 346 pg/mL (ref 180–914)

## 2022-02-14 NOTE — Progress Notes (Signed)
[place] OFFICE PROGRESS NOTE   Diagnosis: Gastrointestinal stromal tumor  INTERVAL HISTORY:   [name] [name] returns as scheduled.  He continues [place].  No new complaint.  He is taking vitamin B12 daily.  Objective:  Vital signs in last 24 hours:  Blood pressure (!) 142/59, pulse 72, temperature 97.8 F (36.6 C), temperature source Oral, resp. rate 20, height 5' 7"  (1.702 m), weight 166 lb 12.8 oz (75.7 kg), SpO2 100 %.    HEENT: No thrush or ulcers Resp: Lungs clear bilaterally Cardio: Regular rate and rhythm GI: No apparent ascites, no mass, nontender, right inguinal hernia Vascular: Trace edema at the right greater than left lower leg   Lab Results:  Lab Results  Component Value Date   WBC 6.1 02/14/2022   HGB 9.0 (L) 02/14/2022   HCT 27.9 (L) 02/14/2022   MCV 112.5 (H) 02/14/2022   PLT 218 02/14/2022   NEUTROABS 3.2 02/14/2022    CMP  Lab Results  Component Value Date   NA 142 12/14/2021   K 4.5 12/14/2021   CL 114 (H) 12/14/2021   CO2 20 (L) 12/14/2021   GLUCOSE 103 (H) 12/14/2021   BUN 36 (H) 12/14/2021   CREATININE 1.90 (H) 12/14/2021   CALCIUM 8.4 (L) 12/14/2021   PROT 5.7 (L) 12/14/2021   ALBUMIN 3.6 12/14/2021   AST 15 12/14/2021   ALT 12 12/14/2021   ALKPHOS 33 (L) 12/14/2021   BILITOT 0.4 12/14/2021   GFRNONAA 33 (L) 12/14/2021   GFRAA 51 (L) 08/05/2020    Medications: I have reviewed the patient's current medications.   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 CT 10/16/2014 consistent with extensive carcinomatosis, CT-guided biopsy of an omental mass 10/21/2014 confirmed a gastrointestinal stromal tumor Initiation of Gleevec 10/31/2014 CT 01/13/2015 revealed improvement in the peritoneal and omental metastatic disease CT 05/05/2015 with no progression of omental/peritoneal metastatic disease, increased ascites, and appendix inflammation CT 07/07/2016-no  abscess, mild perihepatic and left pericolic ascites and mesenteric edema CT of the pelvis 09/20/2016, no residual abscess, no ascites CT abdomen pelvis renal stones study 04/03/2017-partial small bowel obstruction, potentially related to right inguinal hernia CT abdomen/pelvis 08/18/2017-possible cholecystitis, 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  Cholelithiasis. CT renal stone study 06/04/2021-large right and small left inguinal hernias, no ascites   2. History of anemia, status post a nondiagnostic bone marrow biopsy 10/21/2014   3. NSTEMI March 2014   4. Admission 05/06/2015 with acute onset right abdomen pain-potentially related to acute appendicitis versus pain from carcinomatosis CT 05/13/2015 consistent with a right lower abdomen abscess, status post catheter drainage by interventional radiology 05/13/2015 Follow-up CT 05/19/2015 showed resolution of the abscess. Follow-up evaluation in interventional radiology 06/01/2015 showed the abscess cavity had resolved. The abscess drainage catheter communicated with the cecum via the appendix. The catheter was left in place. CT abdomen/pelvis 06/08/2015 showed the right pelvic drain in place without recurrent or residual surrounding fluid collection. Peritoneal metastasis with slight increase in small to moderate volume of abdominal pelvic ascites. Drainage catheter injection 06/12/2015 showed residual tiny fistulous connection with the end of the decompressed abscess cavity within the right lower abdominal quadrant and the residual lumen of the appendix.  Paracentesis 06/12/2015 with 5 liters of fluid  removed Paracentesis 06/19/2015-negative cytology and culture Removal of abscess drainage catheter 06/23/2015 CT 07/26/2015 with increased right abdominal wall inflammation and increased fluid collection at site of previous right abdomen abscess, ascites, and enlargement/inflammation of the appendix Placement of right lower quadrant percutaneous drainage catheter 07/27/2015. Contrast injection confirmed persistent fistulous connection with the ill-defined fluid collection and the residual appendix and cecum. Paracentesis with 2 L of fluid removed. Follow-up CT abdomen/pelvis 08/05/2015 with small to moderate left pleural effusion and moderate to large volume ascites with both appearing slightly decreased since the prior study. Right lower quadrant drainage catheter in place. No residual fluid collection around the drain or in the adjacent abdomen or pelvis. 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 Drainage catheter left in place. 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 [name]     5. C. difficile colitis 05/10/2015, [place]ount Cranker 2019   6.  Right leg edema 12/18/2015. Negative venous Doppler.   7.  Renal insufficiency   8.  Urinary retention June 2022-temporary Foley catheter placed, started on Flomax  9.  B12 deficiency-oral B12 initiated 12/15/2021       Disposition: [name] [name] appears stable.  He has persistent anemia, likely secondary to chronic disease, renal sufficiency, and Gleevec.  The plan is to continue Gleevec at current dose.  He will return for an  office and lab visit in 6 weeks.  He will call for symptoms of anemia.  Betsy Coder, MD  02/14/2022  8:08 AM

## 2022-02-21 DIAGNOSIS — N183 Chronic kidney disease, stage 3 unspecified: Secondary | ICD-10-CM | POA: Diagnosis not present

## 2022-02-21 DIAGNOSIS — E782 Mixed hyperlipidemia: Secondary | ICD-10-CM | POA: Diagnosis not present

## 2022-02-21 DIAGNOSIS — Z0000 Encounter for general adult medical examination without abnormal findings: Secondary | ICD-10-CM | POA: Diagnosis not present

## 2022-02-21 DIAGNOSIS — I10 Essential (primary) hypertension: Secondary | ICD-10-CM | POA: Diagnosis not present

## 2022-02-21 DIAGNOSIS — I700 Atherosclerosis of aorta: Secondary | ICD-10-CM | POA: Diagnosis not present

## 2022-03-23 DIAGNOSIS — G4700 Insomnia, unspecified: Secondary | ICD-10-CM | POA: Diagnosis not present

## 2022-03-23 DIAGNOSIS — R059 Cough, unspecified: Secondary | ICD-10-CM | POA: Diagnosis not present

## 2022-03-28 ENCOUNTER — Inpatient Hospital Stay (HOSPITAL_BASED_OUTPATIENT_CLINIC_OR_DEPARTMENT_OTHER): Payer: Medicare Other | Admitting: Oncology

## 2022-03-28 ENCOUNTER — Inpatient Hospital Stay: Payer: Medicare Other | Attending: Nurse Practitioner

## 2022-03-28 VITALS — BP 120/77 | HR 69 | Temp 97.8°F | Resp 18 | Ht 67.0 in | Wt 157.6 lb

## 2022-03-28 DIAGNOSIS — C49A3 Gastrointestinal stromal tumor of small intestine: Secondary | ICD-10-CM | POA: Diagnosis not present

## 2022-03-28 DIAGNOSIS — C786 Secondary malignant neoplasm of retroperitoneum and peritoneum: Secondary | ICD-10-CM | POA: Diagnosis not present

## 2022-03-28 DIAGNOSIS — I252 Old myocardial infarction: Secondary | ICD-10-CM | POA: Insufficient documentation

## 2022-03-28 DIAGNOSIS — E538 Deficiency of other specified B group vitamins: Secondary | ICD-10-CM | POA: Insufficient documentation

## 2022-03-28 DIAGNOSIS — N289 Disorder of kidney and ureter, unspecified: Secondary | ICD-10-CM | POA: Insufficient documentation

## 2022-03-28 DIAGNOSIS — D649 Anemia, unspecified: Secondary | ICD-10-CM

## 2022-03-28 LAB — CMP (CANCER CENTER ONLY)
ALT: 14 U/L (ref 0–44)
AST: 22 U/L (ref 15–41)
Albumin: 3.7 g/dL (ref 3.5–5.0)
Alkaline Phosphatase: 39 U/L (ref 38–126)
Anion gap: 6 (ref 5–15)
BUN: 30 mg/dL — ABNORMAL HIGH (ref 8–23)
CO2: 20 mmol/L — ABNORMAL LOW (ref 22–32)
Calcium: 8.5 mg/dL — ABNORMAL LOW (ref 8.9–10.3)
Chloride: 114 mmol/L — ABNORMAL HIGH (ref 98–111)
Creatinine: 2.07 mg/dL — ABNORMAL HIGH (ref 0.61–1.24)
GFR, Estimated: 30 mL/min — ABNORMAL LOW (ref >60)
Glucose, Bld: 104 mg/dL — ABNORMAL HIGH (ref 70–99)
Potassium: 4.6 mmol/L (ref 3.5–5.1)
Sodium: 140 mmol/L (ref 135–145)
Total Bilirubin: 0.4 mg/dL (ref 0.3–1.2)
Total Protein: 6 g/dL — ABNORMAL LOW (ref 6.5–8.1)

## 2022-03-28 LAB — CBC WITH DIFFERENTIAL (CANCER CENTER ONLY)
Abs Immature Granulocytes: 0.02 10*3/uL (ref 0.00–0.07)
Basophils Absolute: 0 10*3/uL (ref 0.0–0.1)
Basophils Relative: 0 %
Eosinophils Absolute: 0.2 10*3/uL (ref 0.0–0.5)
Eosinophils Relative: 3 %
HCT: 28.1 % — ABNORMAL LOW (ref 39.0–52.0)
Hemoglobin: 9.4 g/dL — ABNORMAL LOW (ref 13.0–17.0)
Immature Granulocytes: 0 %
Lymphocytes Relative: 20 %
Lymphs Abs: 1 10*3/uL (ref 0.7–4.0)
MCH: 34.6 pg — ABNORMAL HIGH (ref 26.0–34.0)
MCHC: 33.5 g/dL (ref 30.0–36.0)
MCV: 103.3 fL — ABNORMAL HIGH (ref 80.0–100.0)
Monocytes Absolute: 0.7 10*3/uL (ref 0.1–1.0)
Monocytes Relative: 14 %
Neutro Abs: 3.1 10*3/uL (ref 1.7–7.7)
Neutrophils Relative %: 63 %
Platelet Count: 221 10*3/uL (ref 150–400)
RBC: 2.72 MIL/uL — ABNORMAL LOW (ref 4.22–5.81)
RDW: 12.3 % (ref 11.5–15.5)
WBC Count: 5 10*3/uL (ref 4.0–10.5)
nRBC: 0 % (ref 0.0–0.2)

## 2022-03-28 LAB — SAMPLE TO BLOOD BANK

## 2022-03-28 NOTE — Progress Notes (Signed)
?[place] ?OFFICE PROGRESS NOTE ? ? ?Diagnosis: Gastrointestinal stromal tumor ? ?INTERVAL HISTORY:  ? ?[name] [name] returns as scheduled.  He continues [name].  He was diagnosed with COVID-19 03/15/2022.  He reports developing a sore throat, cough, and diarrhea.  He saw Dr. Olen [name] and was treated with medical therapy.  His symptoms have resolved.  He lost weight during the COVID illness. ? ?Objective: ? ?Vital signs in last 24 hours: ? ?Blood pressure 120/77, pulse 69, temperature 97.8 ?F (36.6 ?C), temperature source Oral, resp. rate 18, height _0  (1.702 m), weight 157 lb 9.6 oz (71.5 kg), SpO2 96 %. ?  ? ?HEENT: No thrush or ulcers ?Resp: Lungs clear bilaterally ?Cardio: Regular rate and rhythm ?GI: No hepatosplenomegaly, right inguinal hernia ?Vascular: No leg edema ?  ? ?Lab Results: ? ?Lab Results  ?Component Value Date  ? WBC 5.0 03/28/2022  ? HGB 9.4 (L) 03/28/2022  ? HCT 28.1 (L) 03/28/2022  ? MCV 103.3 (H) 03/28/2022  ? PLT 221 03/28/2022  ? NEUTROABS 3.1 03/28/2022  ? ? ?CMP  ?Lab Results  ?Component Value Date  ? NA 143 02/14/2022  ? K 4.7 02/14/2022  ? CL 112 (H) 02/14/2022  ? CO2 24 02/14/2022  ? GLUCOSE 85 02/14/2022  ? BUN 25 (H) 02/14/2022  ? CREATININE 1.79 (H) 02/14/2022  ? CALCIUM 8.5 (L) 02/14/2022  ? PROT 6.0 (L) 02/14/2022  ? ALBUMIN 3.7 02/14/2022  ? AST 18 02/14/2022  ? ALT 12 02/14/2022  ? ALKPHOS 40 02/14/2022  ? BILITOT 0.4 02/14/2022  ? GFRNONAA 36 (L) 02/14/2022  ? GFRAA 51 (L) 08/05/2020  ? ? ? ?Medications: I have reviewed the patient's current medications. ? ? 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 ?CT 10/16/2014 consistent with extensive carcinomatosis, CT-guided biopsy of an omental mass 10/21/2014 confirmed a gastrointestinal stromal tumor ?Initiation of Gleevec 10/31/2014 ?CT 01/13/2015 revealed improvement in the peritoneal and omental metastatic disease ?CT 05/05/2015 with no progression of  omental/peritoneal metastatic disease, increased ascites, and appendix inflammation ?CT 07/07/2016-no abscess, mild perihepatic and left pericolic ascites and mesenteric edema ?CT of the pelvis 09/20/2016, no residual abscess, no ascites ?CT abdomen pelvis renal stones study 04/03/2017-partial small bowel obstruction, potentially related to right inguinal hernia ?CT abdomen/pelvis 08/18/2017-possible cholecystitis, 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  Cholelithiasis. ?CT renal stone study 06/04/2021-large right and small left inguinal hernias, no ascites ?  ?2. History of anemia, status post a nondiagnostic bone marrow biopsy 10/21/2014 ?  ?3. NSTEMI March 2014 ?  ?4. Admission 05/06/2015 with acute onset right abdomen pain-potentially related to acute appendicitis versus pain from carcinomatosis ?CT 05/13/2015 consistent with a right lower abdomen abscess, status post catheter drainage by interventional radiology 05/13/2015 ?Follow-up CT 05/19/2015 showed resolution of the abscess. ?Follow-up evaluation in interventional radiology 06/01/2015 showed the abscess cavity had resolved. The abscess drainage catheter communicated with the cecum via the appendix. The catheter was left in place. ?CT abdomen/pelvis 06/08/2015 showed the right pelvic drain in place without recurrent or residual surrounding fluid collection. Peritoneal metastasis with slight increase in small to moderate volume of abdominal pelvic ascites. ?Drainage catheter injection 06/12/2015 showed residual tiny fistulous connection with the end of  the decompressed abscess cavity within the right lower  abdominal quadrant and the residual lumen of the appendix. ?Paracentesis 06/12/2015 with 5 liters of fluid removed ?Paracentesis 06/19/2015-negative cytology and culture ?Removal of abscess drainage catheter 06/23/2015 ?CT 07/26/2015 with increased right abdominal wall inflammation and increased fluid collection at site of previous right abdomen abscess, ascites, and enlargement/inflammation of the appendix ?Placement of right lower quadrant percutaneous drainage catheter 07/27/2015. Contrast injection confirmed persistent fistulous connection with the ill-defined fluid collection and the residual appendix and cecum. Paracentesis with 2 L of fluid removed. ?Follow-up CT abdomen/pelvis 08/05/2015 with small to moderate left pleural effusion and moderate to large volume ascites with both appearing slightly decreased since the prior study. Right lower quadrant drainage catheter in place. No residual fluid collection around the drain or in the adjacent abdomen or pelvis. 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 Drainage catheter left in place. ?Tube evaluation 02/10/2016, persistent fistula ?Tube evaluation 03/01/2016, continued fistula to the cecum ?Tube downsized 05/04/2016, no residual abscess seen ?Collapsed abscess with a stable fistula to the cecum on imaging 06/16/2016 ?Tube removed 07/26/2016 ?Recurrent drainage from the right lower quadrant tube site August 2018, status post antibiotics with improvement, drainage again November 2018-antibiotic prescribed by Dr. [name] ?  ?  ?5. C. difficile colitis 05/10/2015, [name] [name] 2019 ?  ?6.  Right leg edema 12/18/2015. Negative venous Doppler. ?  ?7.  Renal insufficiency ?  ?8.  Urinary retention June 2022-temporary Foley catheter placed, started on Flomax ? ?9.  B12 deficiency-oral B12 initiated 12/15/2021 ?  ? ?Disposition: ?[name] [name] appears stable.  He will continue Gleevec.  There is no clinical evidence for  progression of the gastrointestinal stromal tumor.  He continues daily vitamin B12.  The macrocytic anemia has partially improved. ? ?[name] [name] will return for an office and lab visit in 6 weeks. ? ?Betsy Coder, MD ? ?03/28/2022  ?10:04 AM ? ? ?

## 2022-04-21 ENCOUNTER — Other Ambulatory Visit: Payer: Self-pay | Admitting: *Deleted

## 2022-04-21 MED ORDER — IMATINIB MESYLATE 400 MG PO TABS: 400.0000 mg | ORAL_TABLET | Freq: Every day | ORAL | 2 refills | Status: DC

## 2022-04-21 NOTE — Telephone Encounter (Signed)
Received refill request for Gleevec from [facility]. OK to refill per MD. ?

## 2022-04-25 ENCOUNTER — Other Ambulatory Visit: Payer: Self-pay | Admitting: *Deleted

## 2022-04-25 MED ORDER — IMATINIB MESYLATE 400 MG PO TABS: 400.0000 mg | ORAL_TABLET | Freq: Every day | ORAL | 2 refills | Status: DC

## 2022-05-05 ENCOUNTER — Emergency Department (HOSPITAL_COMMUNITY)
Admission: EM | Admit: 2022-05-05 | Discharge: 2022-05-05 | Disposition: A | Discharge status: Home / Self Care | Payer: Medicare Other | Attending: Emergency Medicine | Admitting: Emergency Medicine

## 2022-05-05 ENCOUNTER — Encounter (HOSPITAL_COMMUNITY): Payer: Self-pay | Admitting: Emergency Medicine

## 2022-05-05 ENCOUNTER — Emergency Department (HOSPITAL_COMMUNITY): Payer: Medicare Other

## 2022-05-05 DIAGNOSIS — N1831 Chronic kidney disease, stage 3a: Secondary | ICD-10-CM | POA: Diagnosis not present

## 2022-05-05 DIAGNOSIS — R790 Abnormal level of blood mineral: Secondary | ICD-10-CM | POA: Diagnosis not present

## 2022-05-05 DIAGNOSIS — I491 Atrial premature depolarization: Secondary | ICD-10-CM | POA: Diagnosis not present

## 2022-05-05 DIAGNOSIS — I5023 Acute on chronic systolic (congestive) heart failure: Secondary | ICD-10-CM | POA: Diagnosis not present

## 2022-05-05 DIAGNOSIS — R748 Abnormal levels of other serum enzymes: Secondary | ICD-10-CM | POA: Insufficient documentation

## 2022-05-05 DIAGNOSIS — R0602 Shortness of breath: Secondary | ICD-10-CM

## 2022-05-05 DIAGNOSIS — R2243 Localized swelling, mass and lump, lower limb, bilateral: Secondary | ICD-10-CM | POA: Insufficient documentation

## 2022-05-05 DIAGNOSIS — I2510 Atherosclerotic heart disease of native coronary artery without angina pectoris: Secondary | ICD-10-CM | POA: Insufficient documentation

## 2022-05-05 DIAGNOSIS — I110 Hypertensive heart disease with heart failure: Secondary | ICD-10-CM | POA: Diagnosis not present

## 2022-05-05 DIAGNOSIS — I10 Essential (primary) hypertension: Secondary | ICD-10-CM | POA: Diagnosis not present

## 2022-05-05 DIAGNOSIS — I129 Hypertensive chronic kidney disease with stage 1 through stage 4 chronic kidney disease, or unspecified chronic kidney disease: Secondary | ICD-10-CM | POA: Diagnosis not present

## 2022-05-05 LAB — COMPREHENSIVE METABOLIC PANEL
ALT: 13 U/L (ref 0–44)
AST: 22 U/L (ref 15–41)
Albumin: 3.2 g/dL — ABNORMAL LOW (ref 3.5–5.0)
Alkaline Phosphatase: 31 U/L — ABNORMAL LOW (ref 38–126)
Anion gap: 3 — ABNORMAL LOW (ref 5–15)
BUN: 18 mg/dL (ref 8–23)
CO2: 19 mmol/L — ABNORMAL LOW (ref 22–32)
Calcium: 8.3 mg/dL — ABNORMAL LOW (ref 8.9–10.3)
Chloride: 119 mmol/L — ABNORMAL HIGH (ref 98–111)
Creatinine, Ser: 1.66 mg/dL — ABNORMAL HIGH (ref 0.61–1.24)
GFR, Estimated: 39 mL/min — ABNORMAL LOW (ref >60)
Glucose, Bld: 106 mg/dL — ABNORMAL HIGH (ref 70–99)
Potassium: 4.2 mmol/L (ref 3.5–5.1)
Sodium: 141 mmol/L (ref 135–145)
Total Bilirubin: 0.9 mg/dL (ref 0.3–1.2)
Total Protein: 5.6 g/dL — ABNORMAL LOW (ref 6.5–8.1)

## 2022-05-05 LAB — CBC
HCT: 24.5 % — ABNORMAL LOW (ref 39.0–52.0)
Hemoglobin: 8.1 g/dL — ABNORMAL LOW (ref 13.0–17.0)
MCH: 36.7 pg — ABNORMAL HIGH (ref 26.0–34.0)
MCHC: 33.1 g/dL (ref 30.0–36.0)
MCV: 110.9 fL — ABNORMAL HIGH (ref 80.0–100.0)
Platelets: 188 10*3/uL (ref 150–400)
RBC: 2.21 MIL/uL — ABNORMAL LOW (ref 4.22–5.81)
RDW: 15.2 % (ref 11.5–15.5)
WBC: 4.6 10*3/uL (ref 4.0–10.5)
nRBC: 0 % (ref 0.0–0.2)

## 2022-05-05 LAB — TROPONIN I (HIGH SENSITIVITY)
Troponin I (High Sensitivity): 41 ng/L — ABNORMAL HIGH (ref <18)
Troponin I (High Sensitivity): 46 ng/L — ABNORMAL HIGH (ref <18)

## 2022-05-05 LAB — BRAIN NATRIURETIC PEPTIDE: B Natriuretic Peptide: 623.5 pg/mL — ABNORMAL HIGH (ref 0.0–100.0)

## 2022-05-05 MED ORDER — FUROSEMIDE 10 MG/ML IJ SOLN
20.0000 mg | Freq: Once | INTRAMUSCULAR | Status: AC
  Administered 2022-05-05: 20 mg via INTRAVENOUS
  Filled 2022-05-05: qty 2

## 2022-05-05 MED ORDER — FUROSEMIDE 20 MG PO TABS: 40.0000 mg | ORAL_TABLET | Freq: Every day | ORAL | 0 refills | Status: DC

## 2022-05-05 NOTE — ED Triage Notes (Signed)
Patient BIB [facility] from home c/o shob x 3 weeks.  Patient reports he called tonight because he walked across the house and got shob.  EMS reports patient and his wife are poor historians.  EMS reports patient quit taking his lasix over a month ago on his own because "the pharmacy wouldn't fill it and his PCP retired."  EMS reports patient has hand/facial/abdominal swelling.   160/72 80 HR 98% RA (diminished lung sounds per EMS)

## 2022-05-05 NOTE — ED Notes (Signed)
Pt calling wife to pick him up.  °

## 2022-05-05 NOTE — ED Provider Notes (Signed)
[facility] Emergency Department Provider Note MRN:  [id number]  Arrival date & time: 05/05/22     Chief Complaint   Shortness of Breath   History of Present Illness   [name] [name] is a 88 y.o. year-old male with a history of CAD, CKD presenting to the ED with chief complaint of shortness of breath.  Gradual onset shortness of breath over the past 2 or 3 weeks.  Has not had his Lasix because the pharmacy would not fill it.  No chest pain, no cough, no fever.  Some increased swelling in the legs.  Review of Systems  A thorough review of systems was obtained and all systems are negative except as noted in the HPI and PMH.   Patient's Health History    Past Medical History:  Diagnosis Date   Abdominal mass 02/2013   Acute respiratory failure with hypoxia ([place]) 09/12/2014   CAD (coronary artery disease)    CKD (chronic kidney disease), stage III (HCC)    GIST (gastrointestinal stroma tumor), malignant, colon ([place])    [name] 03/13/2017   Hemorrhagic shock ([place]) 09/13/2014   Hyperlipidemia    TAKES zOCOR DAILY   Hypertension    TAKES LOTREL,HCTZ,AND METOPROLOL DAILY   Intraperitoneal hemorrhage 09/17/2014   NSTEMI (non-ST elevated myocardial infarction) ([place]) 02/2013   "LIGHT: ONE MD SAID HE DID AND ONE SAID HE DIDN'T   Pneumonia    MAR 2014   Shortness of breath    Stromal tumor of digestive system ([place])    UTI (urinary tract infection)     Past Surgical History:  Procedure Laterality Date   CARDIAC CATHETERIZATION  03-05-13   COLONOSCOPY  09/08/2014   dr scooler   CYST EXCISION  1962   wrist   IR GENERIC HISTORICAL  05/17/2016   IR RADIOLOGIST EVAL & MGMT 05/17/2016 [name], MD [facility] INTERV RAD   IR GENERIC HISTORICAL  07/26/2016   IR RADIOLOGIST EVAL & MGMT 07/26/2016 [name], MD [facility] INTERV RAD   LAPAROTOMY N/A 06/04/2013   Procedure: EXPLORATORY LAPAROTOMY, OPEN RESECTION OF MESENTERIC AND INTESTINAL MASS;  Surgeon: [name], MD;   Location: [facility];  Service: General;  Laterality: N/A;  Small Bowel Resection   LEFT HEART CATHETERIZATION WITH CORONARY ANGIOGRAM N/A 03/05/2013   Procedure: LEFT HEART CATHETERIZATION WITH CORONARY ANGIOGRAM;  Surgeon: [name], MD;  Location: [facility] CATH LAB;  Service: Cardiovascular;  Laterality: N/A;    Family History  Problem Relation Age of Onset   Stroke Mother    Skin cancer Father    Skin cancer Brother     Social History   Socioeconomic History   Marital status: Married    Spouse name: [name]   Number of children: 2   Years of education: Not on file   Highest education level: Not on file  Occupational History   Occupation: Retired    Comment: Writer  Tobacco Use   Smoking status: Never   Smokeless tobacco: Never  Vaping Use   Vaping Use: Never used  Substance and Sexual Activity   Alcohol use: No   Drug use: No   Sexual activity: Not Currently  Other Topics Concern   Not on file  Social History Narrative   Married, wife [name]   Retired Environmental manager for 63 years   Independent in [place]   #2 grown sons   Social Determinants of Radio broadcast assistant Strain: Not on [employer]  Insecurity: Not on file  Transportation Needs: Not on file  Physical Activity: Not on file  Stress: Not on file  Social Connections: Not on file  Intimate Partner Violence: Not on file     Physical Exam   Vitals:   05/05/22 0600 05/05/22 0630  BP: (!) 167/69 (!) 150/73  Pulse: 82 73  Resp: (!) 23 19  Temp:    SpO2: 99% 99%    CONSTITUTIONAL: Well-appearing, NAD NEURO/PSYCH:  Alert and oriented x 3, no focal deficits EYES:  eyes equal and reactive ENT/NECK:  no LAD, no JVD CARDIO: Regular rate, well-perfused, normal S1 and S2 PULM:  CTAB no wheezing or rhonchi GI/GU:  non-distended, non-tender MSK/SPINE:  No gross deformities, mild edema to lower extremities SKIN:  no rash, atraumatic   *Additional and/or pertinent findings included in MDM  below  Diagnostic and Interventional Summary    EKG Interpretation  Date/Time:  Thursday May 05 2022 03:41:39 EDT Ventricular Rate:  81 PR Interval:  141 QRS Duration: 89 QT Interval:  399 QTC Calculation: 464 R Axis:   75 Text Interpretation: Sinus rhythm Low voltage, precordial leads Probable anteroseptal infarct, old Confirmed by Gerlene Fee [phone]) on 05/05/2022 4:03:00 AM       Labs Reviewed  CBC - Abnormal; Notable for the following components:      Result Value   RBC 2.21 (*)    Hemoglobin 8.1 (*)    HCT 24.5 (*)    MCV 110.9 (*)    MCH 36.7 (*)    All other components within normal limits  COMPREHENSIVE METABOLIC PANEL - Abnormal; Notable for the following components:   Chloride 119 (*)    CO2 19 (*)    Glucose, Bld 106 (*)    Creatinine, Ser 1.66 (*)    Calcium 8.3 (*)    Total Protein 5.6 (*)    Albumin 3.2 (*)    Alkaline Phosphatase 31 (*)    GFR, Estimated 39 (*)    Anion gap 3 (*)    All other components within normal limits  BRAIN NATRIURETIC PEPTIDE - Abnormal; Notable for the following components:   B Natriuretic Peptide 623.5 (*)    All other components within normal limits  TROPONIN I (HIGH SENSITIVITY) - Abnormal; Notable for the following components:   Troponin I (High Sensitivity) 41 (*)    All other components within normal limits  URINALYSIS, ROUTINE W REFLEX MICROSCOPIC  TROPONIN I (HIGH SENSITIVITY)    DG Chest Port 1 View  Final Result      Medications  furosemide (LASIX) injection 20 mg (20 mg Intravenous Given 05/05/22 0532)     Procedures  /  Critical Care Procedures  ED Course and Medical Decision Making  Initial Impression and Ddx Suspect CHF exacerbation or pulmonary edema given the clinical history, doubt ACS, doubt PE, doubt pneumonia.  Awaiting labs, x-ray.  Past medical/surgical history that increases complexity of ED encounter: CHF, CKD  Interpretation of Diagnostics I personally reviewed the EKG and my  interpretation is as follows: Sinus rhythm    Labs reveal anemia, a bit lower than prior hemoglobin.  Unclear significance, doubt the driving force of patient's symptoms.  BNP is elevated.  Troponin minimally elevated.  Patient Reassessment and Ultimate Disposition/Management Patient is receiving Lasix, awaiting second troponin.  Chest x-ray with question effusion versus pneumonia, clinically doubt pneumonia, favoring CHF, anticipating discharge with prescription for Lasix if second troponin is flat.  Signed out to oncoming provider.  Patient management  required discussion with the following services or consulting groups:  None  Complexity of Problems Addressed Acute illness or injury that poses threat of life of bodily function  Additional Data Reviewed and Analyzed Further history obtained from: Prior labs/imaging results  Additional Factors Impacting ED Encounter Risk Consideration of hospitalization  Barth Kirks. [place] Small, [place] [email or web address]  Final Clinical Impressions(s) / ED Diagnoses     ICD-10-CM   1. SOB (shortness of breath)  R06.02       ED Discharge Orders     None        Discharge Instructions Discussed with and Provided to Patient:   Discharge Instructions   None      Maudie Flakes, MD 05/05/22 [phone]

## 2022-05-05 NOTE — ED Provider Notes (Signed)
I received the patient in signout from Dr. [place] Small, briefly the patient is a 88 year old male with a chief complaints of difficulty breathing.  Thought to be a CHF exacerbation.  Has not had his Lasix recently.  Had good diuresis with 20 mg of IV Lasix.  Has had over a liter.  He tells me he is feeling much better.  Second troponin essentially unchanged from first.  Patient would like to go home.  We will have him double his home Lasix dose for a few days.  Have him call his family doctor today to set up an appointment.  Will return for any worsening.   Deno Etienne, DO 05/05/22 [phone]

## 2022-05-05 NOTE — Discharge Instructions (Signed)
Please double your dose of your fluid pill for the next few days.  Call your family doctor today and see if they can see you in the office within the week and have your blood test rechecked.  Please return to the emergency department for worsening difficulty breathing.

## 2022-05-06 DIAGNOSIS — R0602 Shortness of breath: Secondary | ICD-10-CM | POA: Diagnosis not present

## 2022-05-06 DIAGNOSIS — N183 Chronic kidney disease, stage 3 unspecified: Secondary | ICD-10-CM | POA: Diagnosis not present

## 2022-05-06 DIAGNOSIS — I10 Essential (primary) hypertension: Secondary | ICD-10-CM | POA: Diagnosis not present

## 2022-05-06 DIAGNOSIS — Z862 Personal history of diseases of the blood and blood-forming organs and certain disorders involving the immune mechanism: Secondary | ICD-10-CM | POA: Diagnosis not present

## 2022-05-06 DIAGNOSIS — R600 Localized edema: Secondary | ICD-10-CM | POA: Diagnosis not present

## 2022-05-09 ENCOUNTER — Inpatient Hospital Stay: Payer: Medicare Other | Attending: Nurse Practitioner

## 2022-05-09 ENCOUNTER — Inpatient Hospital Stay (HOSPITAL_BASED_OUTPATIENT_CLINIC_OR_DEPARTMENT_OTHER): Payer: Medicare Other | Admitting: Nurse Practitioner

## 2022-05-09 ENCOUNTER — Encounter: Payer: Self-pay | Admitting: Nurse Practitioner

## 2022-05-09 ENCOUNTER — Other Ambulatory Visit: Payer: Self-pay

## 2022-05-09 VITALS — BP 120/58 | HR 71 | Temp 98.1°F | Resp 18 | Ht 67.0 in | Wt 161.8 lb

## 2022-05-09 DIAGNOSIS — N289 Disorder of kidney and ureter, unspecified: Secondary | ICD-10-CM | POA: Diagnosis not present

## 2022-05-09 DIAGNOSIS — C49A3 Gastrointestinal stromal tumor of small intestine: Secondary | ICD-10-CM | POA: Insufficient documentation

## 2022-05-09 DIAGNOSIS — E538 Deficiency of other specified B group vitamins: Secondary | ICD-10-CM | POA: Diagnosis not present

## 2022-05-09 DIAGNOSIS — A0472 Enterocolitis due to Clostridium difficile, not specified as recurrent: Secondary | ICD-10-CM | POA: Diagnosis not present

## 2022-05-09 DIAGNOSIS — C786 Secondary malignant neoplasm of retroperitoneum and peritoneum: Secondary | ICD-10-CM | POA: Diagnosis not present

## 2022-05-09 DIAGNOSIS — D649 Anemia, unspecified: Secondary | ICD-10-CM

## 2022-05-09 LAB — CMP (CANCER CENTER ONLY)
ALT: 9 U/L (ref 0–44)
AST: 18 U/L (ref 15–41)
Albumin: 3.6 g/dL (ref 3.5–5.0)
Alkaline Phosphatase: 29 U/L — ABNORMAL LOW (ref 38–126)
Anion gap: 10 (ref 5–15)
BUN: 26 mg/dL — ABNORMAL HIGH (ref 8–23)
CO2: 22 mmol/L (ref 22–32)
Calcium: 8.4 mg/dL — ABNORMAL LOW (ref 8.9–10.3)
Chloride: 112 mmol/L — ABNORMAL HIGH (ref 98–111)
Creatinine: 2.2 mg/dL — ABNORMAL HIGH (ref 0.61–1.24)
GFR, Estimated: 28 mL/min — ABNORMAL LOW (ref >60)
Glucose, Bld: 113 mg/dL — ABNORMAL HIGH (ref 70–99)
Potassium: 4.4 mmol/L (ref 3.5–5.1)
Sodium: 144 mmol/L (ref 135–145)
Total Bilirubin: 0.5 mg/dL (ref 0.3–1.2)
Total Protein: 6.1 g/dL — ABNORMAL LOW (ref 6.5–8.1)

## 2022-05-09 LAB — CBC WITH DIFFERENTIAL (CANCER CENTER ONLY)
Abs Immature Granulocytes: 0.01 10*3/uL (ref 0.00–0.07)
Basophils Absolute: 0 10*3/uL (ref 0.0–0.1)
Basophils Relative: 0 %
Eosinophils Absolute: 0.1 10*3/uL (ref 0.0–0.5)
Eosinophils Relative: 2 %
HCT: 24.9 % — ABNORMAL LOW (ref 39.0–52.0)
Hemoglobin: 8.1 g/dL — ABNORMAL LOW (ref 13.0–17.0)
Immature Granulocytes: 0 %
Lymphocytes Relative: 18 %
Lymphs Abs: 1.1 10*3/uL (ref 0.7–4.0)
MCH: 35.5 pg — ABNORMAL HIGH (ref 26.0–34.0)
MCHC: 32.5 g/dL (ref 30.0–36.0)
MCV: 109.2 fL — ABNORMAL HIGH (ref 80.0–100.0)
Monocytes Absolute: 0.6 10*3/uL (ref 0.1–1.0)
Monocytes Relative: 11 %
Neutro Abs: 4.1 10*3/uL (ref 1.7–7.7)
Neutrophils Relative %: 69 %
Platelet Count: 200 10*3/uL (ref 150–400)
RBC: 2.28 MIL/uL — ABNORMAL LOW (ref 4.22–5.81)
RDW: 15.1 % (ref 11.5–15.5)
WBC Count: 6 10*3/uL (ref 4.0–10.5)
nRBC: 0 % (ref 0.0–0.2)

## 2022-05-09 NOTE — Progress Notes (Signed)
[facility] OFFICE PROGRESS NOTE   Diagnosis: Gastrointestinal stromal tumor  INTERVAL HISTORY:   [name] [name] returns as scheduled.  He continues [place].  He was evaluated in the emergency department 05/05/2022 with shortness of breath felt to be a CHF exacerbation.  He improved with diuresis.  He reports improvement in dyspnea and leg swelling.  No nausea or vomiting.  No rash.  No diarrhea.  Objective:  Vital signs in last 24 hours:  Blood pressure (!) 120/58, pulse 71, temperature 98.1 F (36.7 C), temperature source Oral, resp. rate 18, height 5' 7"  (1.702 m), weight 161 lb 12.8 oz (73.4 kg), SpO2 100 %.    HEENT: No thrush or ulcers. Resp: Lungs clear bilaterally. Cardio: Regular rate and rhythm. GI: No hepatosplenomegaly. Vascular: Pitting edema lower legs bilaterally. Skin: No rash.   Lab Results:  Lab Results  Component Value Date   WBC 6.0 05/09/2022   HGB 8.1 (L) 05/09/2022   HCT 24.9 (L) 05/09/2022   MCV 109.2 (H) 05/09/2022   PLT 200 05/09/2022   NEUTROABS 4.1 05/09/2022    Imaging:  No results found.  Medications: I have reviewed the patient's current medications.  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 CT 10/16/2014 consistent with extensive carcinomatosis, CT-guided biopsy of an omental mass 10/21/2014 confirmed a gastrointestinal stromal tumor Initiation of Gleevec 10/31/2014 CT 01/13/2015 revealed improvement in the peritoneal and omental metastatic disease CT 05/05/2015 with no progression of omental/peritoneal metastatic disease, increased ascites, and appendix inflammation CT 07/07/2016-no abscess, mild perihepatic and left pericolic ascites and mesenteric edema CT of the pelvis 09/20/2016, no residual abscess, no ascites CT abdomen pelvis renal stones study 04/03/2017-partial small bowel obstruction, potentially related to right inguinal hernia CT abdomen/pelvis  08/18/2017-possible cholecystitis, 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  Cholelithiasis. CT renal stone study 06/04/2021-large right and small left inguinal hernias, no ascites    2. History of anemia, status post a nondiagnostic bone marrow biopsy 10/21/2014   3. NSTEMI March 2014   4. Admission 05/06/2015 with acute onset right abdomen pain-potentially related to acute appendicitis versus pain from carcinomatosis CT 05/13/2015 consistent with a right lower abdomen abscess, status post catheter drainage by interventional radiology 05/13/2015 Follow-up CT 05/19/2015 showed resolution of the abscess. Follow-up evaluation in interventional radiology 06/01/2015 showed the abscess cavity had resolved. The abscess drainage catheter communicated with the cecum via the appendix. The catheter was left in place. CT abdomen/pelvis 06/08/2015 showed the right pelvic drain in place without recurrent or residual surrounding fluid collection. Peritoneal metastasis with slight increase in small to moderate volume of abdominal pelvic ascites. Drainage catheter injection 06/12/2015 showed residual tiny fistulous connection with the end of the decompressed abscess cavity within the right lower abdominal quadrant and the residual lumen of the appendix. Paracentesis 06/12/2015 with 5 liters of fluid removed Paracentesis 06/19/2015-negative cytology and culture Removal of abscess drainage catheter 06/23/2015 CT 07/26/2015 with increased right abdominal wall inflammation and increased fluid collection at site of previous right abdomen abscess, ascites, and  enlargement/inflammation of the appendix Placement of right lower quadrant percutaneous drainage catheter 07/27/2015. Contrast injection confirmed persistent fistulous connection with the ill-defined fluid collection and the residual appendix and cecum.  Paracentesis with 2 L of fluid removed. Follow-up CT abdomen/pelvis 08/05/2015 with small to moderate left pleural effusion and moderate to large volume ascites with both appearing slightly decreased since the prior study. Right lower quadrant drainage catheter in place. No residual fluid collection around the drain or in the adjacent abdomen or pelvis. 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 Drainage catheter left in place. 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 [name]     5. C. difficile colitis 05/10/2015, Lamount Cranker 2019   6.  Right leg edema 12/18/2015. Negative venous Doppler.   7.  Renal insufficiency   8.  Urinary retention June 2022-temporary Foley catheter placed, started on Flomax   9.  B12 deficiency-oral B12 initiated 12/15/2021    Disposition: [name] [name] appears stable.  He continues [place].  We reviewed the CBC from today.  He has progressive anemia.  This could be due to [facility] and/or renal failure.  We are obtaining an erythropoietin level.  He may be a candidate for erythropoietin injections.  He will return for lab and follow-up in 3 weeks.  We are available to see him sooner if needed.  Plan reviewed with Dr. [name].    Ned Card ANP/GNP-BC   05/09/2022  10:12 AM

## 2022-05-18 DIAGNOSIS — R051 Acute cough: Secondary | ICD-10-CM | POA: Diagnosis not present

## 2022-05-18 DIAGNOSIS — J069 Acute upper respiratory infection, unspecified: Secondary | ICD-10-CM | POA: Diagnosis not present

## 2022-05-31 ENCOUNTER — Inpatient Hospital Stay (HOSPITAL_BASED_OUTPATIENT_CLINIC_OR_DEPARTMENT_OTHER): Payer: Medicare Other | Admitting: Oncology

## 2022-05-31 ENCOUNTER — Other Ambulatory Visit: Payer: Self-pay

## 2022-05-31 ENCOUNTER — Inpatient Hospital Stay: Payer: Medicare Other | Attending: Nurse Practitioner

## 2022-05-31 VITALS — BP 140/56 | HR 74 | Temp 98.2°F | Resp 18 | Ht 67.0 in | Wt 159.6 lb

## 2022-05-31 DIAGNOSIS — I252 Old myocardial infarction: Secondary | ICD-10-CM | POA: Diagnosis not present

## 2022-05-31 DIAGNOSIS — C49A3 Gastrointestinal stromal tumor of small intestine: Secondary | ICD-10-CM | POA: Insufficient documentation

## 2022-05-31 DIAGNOSIS — J90 Pleural effusion, not elsewhere classified: Secondary | ICD-10-CM | POA: Insufficient documentation

## 2022-05-31 DIAGNOSIS — E538 Deficiency of other specified B group vitamins: Secondary | ICD-10-CM | POA: Insufficient documentation

## 2022-05-31 DIAGNOSIS — C786 Secondary malignant neoplasm of retroperitoneum and peritoneum: Secondary | ICD-10-CM | POA: Diagnosis not present

## 2022-05-31 DIAGNOSIS — D649 Anemia, unspecified: Secondary | ICD-10-CM

## 2022-05-31 DIAGNOSIS — N289 Disorder of kidney and ureter, unspecified: Secondary | ICD-10-CM | POA: Diagnosis not present

## 2022-05-31 DIAGNOSIS — A0472 Enterocolitis due to Clostridium difficile, not specified as recurrent: Secondary | ICD-10-CM | POA: Diagnosis not present

## 2022-05-31 LAB — CBC WITH DIFFERENTIAL (CANCER CENTER ONLY)
Abs Immature Granulocytes: 0.01 10*3/uL (ref 0.00–0.07)
Basophils Absolute: 0 10*3/uL (ref 0.0–0.1)
Basophils Relative: 0 %
Eosinophils Absolute: 0.1 10*3/uL (ref 0.0–0.5)
Eosinophils Relative: 2 %
HCT: 25.5 % — ABNORMAL LOW (ref 39.0–52.0)
Hemoglobin: 8.4 g/dL — ABNORMAL LOW (ref 13.0–17.0)
Immature Granulocytes: 0 %
Lymphocytes Relative: 21 %
Lymphs Abs: 1 10*3/uL (ref 0.7–4.0)
MCH: 36.5 pg — ABNORMAL HIGH (ref 26.0–34.0)
MCHC: 32.9 g/dL (ref 30.0–36.0)
MCV: 110.9 fL — ABNORMAL HIGH (ref 80.0–100.0)
Monocytes Absolute: 0.4 10*3/uL (ref 0.1–1.0)
Monocytes Relative: 9 %
Neutro Abs: 3.3 10*3/uL (ref 1.7–7.7)
Neutrophils Relative %: 68 %
Platelet Count: 222 10*3/uL (ref 150–400)
RBC: 2.3 MIL/uL — ABNORMAL LOW (ref 4.22–5.81)
RDW: 13.7 % (ref 11.5–15.5)
WBC Count: 4.8 10*3/uL (ref 4.0–10.5)
nRBC: 0 % (ref 0.0–0.2)

## 2022-05-31 LAB — SAMPLE TO BLOOD BANK

## 2022-05-31 NOTE — Progress Notes (Signed)
[place] OFFICE PROGRESS NOTE   Diagnosis: Gastrointestinal stromal tumor  INTERVAL HISTORY:   Mr. Collard returns as scheduled.  He continues [place].  No diarrhea.  No swelling.  He reports altered taste.  Objective:  Vital signs in last 24 hours:  Blood pressure (!) 140/56, pulse 74, temperature 98.2 F (36.8 C), temperature source Oral, resp. rate 18, height 5' 7"  (1.702 m), weight 159 lb 9.6 oz (72.4 kg), SpO2 100 %.    HEENT: No thrush or ulcers Resp: Lungs clear bilaterally Cardio: Regular rate and rhythm GI: No hepatosplenomegaly, right inguinal hernia Vascular: No leg edema    Lab Results:  Lab Results  Component Value Date   WBC 4.8 05/31/2022   HGB 8.4 (L) 05/31/2022   HCT 25.5 (L) 05/31/2022   MCV 110.9 (H) 05/31/2022   PLT 222 05/31/2022   NEUTROABS 3.3 05/31/2022    CMP  Lab Results  Component Value Date   NA 144 05/09/2022   K 4.4 05/09/2022   CL 112 (H) 05/09/2022   CO2 22 05/09/2022   GLUCOSE 113 (H) 05/09/2022   BUN 26 (H) 05/09/2022   CREATININE 2.20 (H) 05/09/2022   CALCIUM 8.4 (L) 05/09/2022   PROT 6.1 (L) 05/09/2022   ALBUMIN 3.6 05/09/2022   AST 18 05/09/2022   ALT 9 05/09/2022   ALKPHOS 29 (L) 05/09/2022   BILITOT 0.5 05/09/2022   GFRNONAA 28 (L) 05/09/2022   GFRAA 51 (L) 08/05/2020    No results found for: "CEA1", "CEA", "CAN199", "CA125"  Lab Results  Component Value Date   INR 1.17 07/26/2015   LABPROT 15.1 07/26/2015    Imaging:  No results found.  Medications: I have reviewed the patient's current medications.   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 CT 10/16/2014 consistent with extensive carcinomatosis, CT-guided biopsy of an omental mass 10/21/2014 confirmed a gastrointestinal stromal tumor Initiation of Gleevec 10/31/2014 CT 01/13/2015 revealed improvement in the peritoneal and omental metastatic disease CT 05/05/2015 with  no progression of omental/peritoneal metastatic disease, increased ascites, and appendix inflammation CT 07/07/2016-no abscess, mild perihepatic and left pericolic ascites and mesenteric edema CT of the pelvis 09/20/2016, no residual abscess, no ascites CT abdomen pelvis renal stones study 04/03/2017-partial small bowel obstruction, potentially related to right inguinal hernia CT abdomen/pelvis 08/18/2017-possible cholecystitis, 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  Cholelithiasis. CT renal stone study 06/04/2021-large right and small left inguinal hernias, no ascites    2. History of anemia, status post a nondiagnostic bone marrow biopsy 10/21/2014   3. NSTEMI March 2014   4. Admission 05/06/2015 with acute onset right abdomen pain-potentially related to acute appendicitis versus pain from carcinomatosis CT 05/13/2015 consistent with a right lower abdomen abscess, status post catheter drainage by interventional radiology 05/13/2015 Follow-up CT 05/19/2015 showed resolution of the abscess. Follow-up evaluation in interventional radiology 06/01/2015 showed the abscess cavity had resolved. The abscess drainage catheter communicated with the cecum via the appendix. The catheter was left in place. CT abdomen/pelvis 06/08/2015 showed the right pelvic drain in place without recurrent or residual surrounding fluid collection. Peritoneal metastasis with slight increase in small to moderate volume of abdominal pelvic ascites. Drainage catheter injection 06/12/2015 showed  residual tiny fistulous connection with the end of the decompressed abscess cavity within the  right lower abdominal quadrant and the residual lumen of the appendix. Paracentesis 06/12/2015 with 5 liters of fluid removed Paracentesis 06/19/2015-negative cytology and culture Removal of abscess drainage catheter 06/23/2015 CT 07/26/2015 with increased right abdominal wall inflammation and increased fluid collection at site of previous right abdomen abscess, ascites, and enlargement/inflammation of the appendix Placement of right lower quadrant percutaneous drainage catheter 07/27/2015. Contrast injection confirmed persistent fistulous connection with the ill-defined fluid collection and the residual appendix and cecum. Paracentesis with 2 L of fluid removed. Follow-up CT abdomen/pelvis 08/05/2015 with small to moderate left pleural effusion and moderate to large volume ascites with both appearing slightly decreased since the prior study. Right lower quadrant drainage catheter in place. No residual fluid collection around the drain or in the adjacent abdomen or pelvis. 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 Drainage catheter left in place. 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 [name]     5. C. difficile colitis 05/10/2015, Lamount Cranker 2019   6.  Right leg edema 12/18/2015. Negative venous Doppler.   7.  Renal insufficiency   8.  Urinary retention June 2022-temporary Foley catheter placed, started on Flomax   9.  B12 deficiency-oral B12 initiated 12/15/2021     Disposition: Mr. Daino remains in clinical remission from the gastrointestinal stromal tumor.   He has persistent severe anemia.  He declines a trial of erythropoietin therapy.  He will return for an office visit and CBC in 1 month.  We will transfuse packed red blood cells if the hemoglobin falls further.  Betsy Coder, MD  05/31/2022  5:35 PM

## 2022-06-02 LAB — ERYTHROPOIETIN: Erythropoietin: 17.1 m[IU]/mL (ref 2.6–18.5)

## 2022-06-28 ENCOUNTER — Inpatient Hospital Stay (HOSPITAL_BASED_OUTPATIENT_CLINIC_OR_DEPARTMENT_OTHER): Payer: Medicare Other | Admitting: Nurse Practitioner

## 2022-06-28 ENCOUNTER — Inpatient Hospital Stay: Payer: Medicare Other | Attending: Nurse Practitioner

## 2022-06-28 ENCOUNTER — Encounter: Payer: Self-pay | Admitting: Nurse Practitioner

## 2022-06-28 VITALS — BP 140/58 | HR 70 | Temp 98.2°F | Resp 18 | Ht 67.0 in | Wt 160.0 lb

## 2022-06-28 DIAGNOSIS — A0472 Enterocolitis due to Clostridium difficile, not specified as recurrent: Secondary | ICD-10-CM | POA: Diagnosis not present

## 2022-06-28 DIAGNOSIS — N289 Disorder of kidney and ureter, unspecified: Secondary | ICD-10-CM | POA: Diagnosis not present

## 2022-06-28 DIAGNOSIS — D649 Anemia, unspecified: Secondary | ICD-10-CM | POA: Diagnosis not present

## 2022-06-28 DIAGNOSIS — C786 Secondary malignant neoplasm of retroperitoneum and peritoneum: Secondary | ICD-10-CM | POA: Diagnosis not present

## 2022-06-28 DIAGNOSIS — E538 Deficiency of other specified B group vitamins: Secondary | ICD-10-CM | POA: Insufficient documentation

## 2022-06-28 DIAGNOSIS — I252 Old myocardial infarction: Secondary | ICD-10-CM | POA: Diagnosis not present

## 2022-06-28 DIAGNOSIS — C49A3 Gastrointestinal stromal tumor of small intestine: Secondary | ICD-10-CM | POA: Insufficient documentation

## 2022-06-28 LAB — CBC WITH DIFFERENTIAL (CANCER CENTER ONLY)
Abs Immature Granulocytes: 0 10*3/uL (ref 0.00–0.07)
Basophils Absolute: 0 10*3/uL (ref 0.0–0.1)
Basophils Relative: 0 %
Eosinophils Absolute: 0.2 10*3/uL (ref 0.0–0.5)
Eosinophils Relative: 3 %
HCT: 27 % — ABNORMAL LOW (ref 39.0–52.0)
Hemoglobin: 9 g/dL — ABNORMAL LOW (ref 13.0–17.0)
Immature Granulocytes: 0 %
Lymphocytes Relative: 21 %
Lymphs Abs: 1 10*3/uL (ref 0.7–4.0)
MCH: 37.2 pg — ABNORMAL HIGH (ref 26.0–34.0)
MCHC: 33.3 g/dL (ref 30.0–36.0)
MCV: 111.6 fL — ABNORMAL HIGH (ref 80.0–100.0)
Monocytes Absolute: 0.5 10*3/uL (ref 0.1–1.0)
Monocytes Relative: 10 %
Neutro Abs: 3 10*3/uL (ref 1.7–7.7)
Neutrophils Relative %: 66 %
Platelet Count: 169 10*3/uL (ref 150–400)
RBC: 2.42 MIL/uL — ABNORMAL LOW (ref 4.22–5.81)
RDW: 12.8 % (ref 11.5–15.5)
WBC Count: 4.6 10*3/uL (ref 4.0–10.5)
nRBC: 0 % (ref 0.0–0.2)

## 2022-06-28 LAB — CMP (CANCER CENTER ONLY)
ALT: 14 U/L (ref 0–44)
AST: 19 U/L (ref 15–41)
Albumin: 3.9 g/dL (ref 3.5–5.0)
Alkaline Phosphatase: 37 U/L — ABNORMAL LOW (ref 38–126)
Anion gap: 10 (ref 5–15)
BUN: 25 mg/dL — ABNORMAL HIGH (ref 8–23)
CO2: 23 mmol/L (ref 22–32)
Calcium: 8.8 mg/dL — ABNORMAL LOW (ref 8.9–10.3)
Chloride: 111 mmol/L (ref 98–111)
Creatinine: 1.97 mg/dL — ABNORMAL HIGH (ref 0.61–1.24)
GFR, Estimated: 31 mL/min — ABNORMAL LOW (ref >60)
Glucose, Bld: 126 mg/dL — ABNORMAL HIGH (ref 70–99)
Potassium: 4.7 mmol/L (ref 3.5–5.1)
Sodium: 144 mmol/L (ref 135–145)
Total Bilirubin: 0.4 mg/dL (ref 0.3–1.2)
Total Protein: 6.2 g/dL — ABNORMAL LOW (ref 6.5–8.1)

## 2022-06-28 LAB — SAMPLE TO BLOOD BANK

## 2022-06-28 NOTE — Progress Notes (Signed)
[place] OFFICE PROGRESS NOTE   Diagnosis: Gastrointestinal stromal tumor  INTERVAL HISTORY:   [name] [name] returns as scheduled.  He continues [name] [name].  He reports feeling well.  No nausea or vomiting.  Occasional loose stool.  No leg swelling.  Intermittent edema around the eyes.  No shortness of breath.  He describes energy and appetite as "pretty good".  Objective:  Vital signs in last 24 hours:  Blood pressure (!) 140/58, pulse 70, temperature 98.2 F (36.8 C), temperature source Oral, resp. rate 18, height 5' 7"  (1.702 m), weight 160 lb (72.6 kg), SpO2 100 %.    HEENT: No thrush or ulcers.  Periorbital edema. Resp: Lungs clear bilaterally. Cardio: Regular rate and rhythm. GI: No hepatosplenomegaly. Vascular: Trace edema right greater than left lower leg.   Lab Results:  Lab Results  Component Value Date   WBC 4.6 06/28/2022   HGB 9.0 (L) 06/28/2022   HCT 27.0 (L) 06/28/2022   MCV 111.6 (H) 06/28/2022   PLT 169 06/28/2022   NEUTROABS 3.0 06/28/2022    Imaging:  No results found.  Medications: I have reviewed the patient's current medications.  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 CT 10/16/2014 consistent with extensive carcinomatosis, CT-guided biopsy of an omental mass 10/21/2014 confirmed a gastrointestinal stromal tumor Initiation of Gleevec 10/31/2014 CT 01/13/2015 revealed improvement in the peritoneal and omental metastatic disease CT 05/05/2015 with no progression of omental/peritoneal metastatic disease, increased ascites, and appendix inflammation CT 07/07/2016-no abscess, mild perihepatic and left pericolic ascites and mesenteric edema CT of the pelvis 09/20/2016, no residual abscess, no ascites CT abdomen pelvis renal stones study 04/03/2017-partial small bowel obstruction, potentially related to right inguinal hernia CT abdomen/pelvis 08/18/2017-possible  cholecystitis, 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  Cholelithiasis. CT renal stone study 06/04/2021-large right and small left inguinal hernias, no ascites     2. History of anemia, status post a nondiagnostic bone marrow biopsy 10/21/2014   3. NSTEMI March 2014   4. Admission 05/06/2015 with acute onset right abdomen pain-potentially related to acute appendicitis versus pain from carcinomatosis CT 05/13/2015 consistent with a right lower abdomen abscess, status post catheter drainage by interventional radiology 05/13/2015 Follow-up CT 05/19/2015 showed resolution of the abscess. Follow-up evaluation in interventional radiology 06/01/2015 showed the abscess cavity had resolved. The abscess drainage catheter communicated with the cecum via the appendix. The catheter was left in place. CT abdomen/pelvis 06/08/2015 showed the right pelvic drain in place without recurrent or residual surrounding fluid collection. Peritoneal metastasis with slight increase in small to moderate volume of abdominal pelvic ascites. Drainage catheter injection 06/12/2015 showed residual tiny fistulous connection with the end of the decompressed abscess cavity within the right lower abdominal quadrant and the residual lumen of the appendix. Paracentesis 06/12/2015 with 5 liters of fluid removed Paracentesis 06/19/2015-negative cytology and culture Removal of abscess drainage catheter 06/23/2015 CT 07/26/2015 with increased right abdominal wall inflammation and increased fluid collection at site of previous right abdomen abscess, ascites, and enlargement/inflammation of the  appendix Placement of right lower quadrant percutaneous drainage catheter 07/27/2015. Contrast injection confirmed persistent fistulous connection with the ill-defined fluid collection and the residual appendix and cecum. Paracentesis with 2 L  of fluid removed. Follow-up CT abdomen/pelvis 08/05/2015 with small to moderate left pleural effusion and moderate to large volume ascites with both appearing slightly decreased since the prior study. Right lower quadrant drainage catheter in place. No residual fluid collection around the drain or in the adjacent abdomen or pelvis. 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 Drainage catheter left in place. 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 C. difficile colitis 05/10/2015, [name] [name] 2019   6.  Right leg edema 12/18/2015. Negative venous Doppler.   7.  Renal insufficiency   8.  Urinary retention June 2022-temporary Foley catheter placed, started on Flomax   9.  B12 deficiency-oral B12 initiated 12/15/2021    Disposition: [name] [name] appears stable.  He will continue Gleevec at the current dose.    We reviewed the CBC from today.  Hemoglobin is mildly improved.  He remains asymptomatic.  He will return for lab and follow-up in approximately 5 weeks.  We are available to see him sooner if needed.  We reviewed signs/symptoms suggestive of progressive anemia.    Ned Card ANP/GNP-BC   06/28/2022  9:45 AM

## 2022-07-12 DIAGNOSIS — H40013 Open angle with borderline findings, low risk, bilateral: Secondary | ICD-10-CM | POA: Diagnosis not present

## 2022-07-12 DIAGNOSIS — Z961 Presence of intraocular lens: Secondary | ICD-10-CM | POA: Diagnosis not present

## 2022-08-02 ENCOUNTER — Inpatient Hospital Stay: Payer: Medicare Other | Attending: Nurse Practitioner

## 2022-08-02 ENCOUNTER — Inpatient Hospital Stay (HOSPITAL_BASED_OUTPATIENT_CLINIC_OR_DEPARTMENT_OTHER): Payer: Medicare Other | Admitting: Oncology

## 2022-08-02 VITALS — BP 140/65 | HR 70 | Temp 98.1°F | Resp 18 | Ht 67.0 in | Wt 161.8 lb

## 2022-08-02 DIAGNOSIS — Z8619 Personal history of other infectious and parasitic diseases: Secondary | ICD-10-CM | POA: Diagnosis not present

## 2022-08-02 DIAGNOSIS — C786 Secondary malignant neoplasm of retroperitoneum and peritoneum: Secondary | ICD-10-CM | POA: Insufficient documentation

## 2022-08-02 DIAGNOSIS — I252 Old myocardial infarction: Secondary | ICD-10-CM | POA: Diagnosis not present

## 2022-08-02 DIAGNOSIS — E538 Deficiency of other specified B group vitamins: Secondary | ICD-10-CM | POA: Insufficient documentation

## 2022-08-02 DIAGNOSIS — C49A3 Gastrointestinal stromal tumor of small intestine: Secondary | ICD-10-CM | POA: Insufficient documentation

## 2022-08-02 DIAGNOSIS — D649 Anemia, unspecified: Secondary | ICD-10-CM

## 2022-08-02 DIAGNOSIS — N289 Disorder of kidney and ureter, unspecified: Secondary | ICD-10-CM | POA: Diagnosis not present

## 2022-08-02 DIAGNOSIS — J90 Pleural effusion, not elsewhere classified: Secondary | ICD-10-CM | POA: Insufficient documentation

## 2022-08-02 LAB — CBC WITH DIFFERENTIAL (CANCER CENTER ONLY)
Abs Immature Granulocytes: 0.01 10*3/uL (ref 0.00–0.07)
Basophils Absolute: 0 10*3/uL (ref 0.0–0.1)
Basophils Relative: 0 %
Eosinophils Absolute: 0.1 10*3/uL (ref 0.0–0.5)
Eosinophils Relative: 2 %
HCT: 26.7 % — ABNORMAL LOW (ref 39.0–52.0)
Hemoglobin: 9.1 g/dL — ABNORMAL LOW (ref 13.0–17.0)
Immature Granulocytes: 0 %
Lymphocytes Relative: 19 %
Lymphs Abs: 1 10*3/uL (ref 0.7–4.0)
MCH: 37.3 pg — ABNORMAL HIGH (ref 26.0–34.0)
MCHC: 34.1 g/dL (ref 30.0–36.0)
MCV: 109.4 fL — ABNORMAL HIGH (ref 80.0–100.0)
Monocytes Absolute: 0.5 10*3/uL (ref 0.1–1.0)
Monocytes Relative: 9 %
Neutro Abs: 3.7 10*3/uL (ref 1.7–7.7)
Neutrophils Relative %: 70 %
Platelet Count: 219 10*3/uL (ref 150–400)
RBC: 2.44 MIL/uL — ABNORMAL LOW (ref 4.22–5.81)
RDW: 12.5 % (ref 11.5–15.5)
WBC Count: 5.3 10*3/uL (ref 4.0–10.5)
nRBC: 0 % (ref 0.0–0.2)

## 2022-08-02 LAB — CMP (CANCER CENTER ONLY)
ALT: 9 U/L (ref 0–44)
AST: 17 U/L (ref 15–41)
Albumin: 3.9 g/dL (ref 3.5–5.0)
Alkaline Phosphatase: 39 U/L (ref 38–126)
Anion gap: 9 (ref 5–15)
BUN: 27 mg/dL — ABNORMAL HIGH (ref 8–23)
CO2: 21 mmol/L — ABNORMAL LOW (ref 22–32)
Calcium: 8.5 mg/dL — ABNORMAL LOW (ref 8.9–10.3)
Chloride: 114 mmol/L — ABNORMAL HIGH (ref 98–111)
Creatinine: 2.06 mg/dL — ABNORMAL HIGH (ref 0.61–1.24)
GFR, Estimated: 30 mL/min — ABNORMAL LOW (ref >60)
Glucose, Bld: 110 mg/dL — ABNORMAL HIGH (ref 70–99)
Potassium: 4.3 mmol/L (ref 3.5–5.1)
Sodium: 144 mmol/L (ref 135–145)
Total Bilirubin: 0.5 mg/dL (ref 0.3–1.2)
Total Protein: 6.3 g/dL — ABNORMAL LOW (ref 6.5–8.1)

## 2022-08-02 LAB — SAMPLE TO BLOOD BANK

## 2022-08-02 MED ORDER — IMATINIB MESYLATE 400 MG PO TABS: 400.0000 mg | ORAL_TABLET | Freq: Every day | ORAL | 2 refills | Status: DC

## 2022-08-02 NOTE — Progress Notes (Signed)
[place] OFFICE PROGRESS NOTE   Diagnosis: Gastrointestinal stromal tumor  INTERVAL HISTORY:   [name] [name] returns as scheduled.  He continues [place].  He has occasional diarrhea.  No rash.  No dyspnea.  No new complaint.  Objective:  Vital signs in last 24 hours:  Blood pressure (!) 140/65, pulse 70, temperature 98.1 F (36.7 C), temperature source Oral, resp. rate 18, height 5' 7"  (1.702 m), weight 161 lb 12.8 oz (73.4 kg), SpO2 100 %.    HEENT: No buccal thrush or ulcers, mild white coat over the tongue Resp: Lungs clear bilaterally Cardio: Regular rate and rhythm GI: No hepatosplenomegaly, no apparent ascites, nontender, no mass, right inguinal hernia Vascular: Trace edema at the right greater than left lower leg   Lab Results:  Lab Results  Component Value Date   WBC 5.3 08/02/2022   HGB 9.1 (L) 08/02/2022   HCT 26.7 (L) 08/02/2022   MCV 109.4 (H) 08/02/2022   PLT 219 08/02/2022   NEUTROABS 3.7 08/02/2022    CMP  Lab Results  Component Value Date   NA 144 06/28/2022   K 4.7 06/28/2022   CL 111 06/28/2022   CO2 23 06/28/2022   GLUCOSE 126 (H) 06/28/2022   BUN 25 (H) 06/28/2022   CREATININE 1.97 (H) 06/28/2022   CALCIUM 8.8 (L) 06/28/2022   PROT 6.2 (L) 06/28/2022   ALBUMIN 3.9 06/28/2022   AST 19 06/28/2022   ALT 14 06/28/2022   ALKPHOS 37 (L) 06/28/2022   BILITOT 0.4 06/28/2022   GFRNONAA 31 (L) 06/28/2022   GFRAA 51 (L) 08/05/2020    Medications: I have reviewed the patient's current medications.   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 CT 10/16/2014 consistent with extensive carcinomatosis, CT-guided biopsy of an omental mass 10/21/2014 confirmed a gastrointestinal stromal tumor Initiation of Gleevec 10/31/2014 CT 01/13/2015 revealed improvement in the peritoneal and omental metastatic disease CT 05/05/2015 with no progression of omental/peritoneal metastatic  disease, increased ascites, and appendix inflammation CT 07/07/2016-no abscess, mild perihepatic and left pericolic ascites and mesenteric edema CT of the pelvis 09/20/2016, no residual abscess, no ascites CT abdomen pelvis renal stones study 04/03/2017-partial small bowel obstruction, potentially related to right inguinal hernia CT abdomen/pelvis 08/18/2017-possible cholecystitis, 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  Cholelithiasis. CT renal stone study 06/04/2021-large right and small left inguinal hernias, no ascites     2. History of anemia, status post a nondiagnostic bone marrow biopsy 10/21/2014   3. NSTEMI March 2014   4. Admission 05/06/2015 with acute onset right abdomen pain-potentially related to acute appendicitis versus pain from carcinomatosis CT 05/13/2015 consistent with a right lower abdomen abscess, status post catheter drainage by interventional radiology 05/13/2015 Follow-up CT 05/19/2015 showed resolution of the abscess. Follow-up evaluation in interventional radiology 06/01/2015 showed the abscess cavity had resolved. The abscess drainage catheter communicated with the cecum via the appendix. The catheter was left in place. CT abdomen/pelvis 06/08/2015 showed the right pelvic drain in place without recurrent or residual surrounding fluid collection. Peritoneal metastasis with slight increase in small to moderate volume of abdominal pelvic ascites. Drainage catheter injection 06/12/2015 showed residual tiny fistulous connection with the end of the decompressed abscess cavity  within the right lower abdominal quadrant and the residual  lumen of the appendix. Paracentesis 06/12/2015 with 5 liters of fluid removed Paracentesis 06/19/2015-negative cytology and culture Removal of abscess drainage catheter 06/23/2015 CT 07/26/2015 with increased right abdominal wall inflammation and increased fluid collection at site of previous right abdomen abscess, ascites, and enlargement/inflammation of the appendix Placement of right lower quadrant percutaneous drainage catheter 07/27/2015. Contrast injection confirmed persistent fistulous connection with the ill-defined fluid collection and the residual appendix and cecum. Paracentesis with 2 L of fluid removed. Follow-up CT abdomen/pelvis 08/05/2015 with small to moderate left pleural effusion and moderate to large volume ascites with both appearing slightly decreased since the prior study. Right lower quadrant drainage catheter in place. No residual fluid collection around the drain or in the adjacent abdomen or pelvis. 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 Drainage catheter left in place. 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 [name]     5. C. difficile colitis 05/10/2015, Lamount Cranker 2019   6.  Right leg edema 12/18/2015. Negative venous Doppler.   7.  Renal insufficiency   8.  Urinary retention June 2022-temporary Foley catheter placed, started on Flomax   9.  B12 deficiency-oral B12 initiated 12/15/2021     Disposition: Mr. Gorin appears stable.  There is no clinical evidence for progression of the gastrointestinal stromal tumor.  He has stable  anemia secondary to renal insufficiency and imatinib.  He appears asymptomatic from the anemia.  The plan is to continue imatinib at the current dose.  He will return for an office and lab visit in 2 months.  Betsy Coder, MD  08/02/2022  9:51 AM

## 2022-08-18 ENCOUNTER — Other Ambulatory Visit (HOSPITAL_COMMUNITY): Payer: Self-pay

## 2022-08-23 DIAGNOSIS — I10 Essential (primary) hypertension: Secondary | ICD-10-CM | POA: Diagnosis not present

## 2022-08-23 DIAGNOSIS — C49A3 Gastrointestinal stromal tumor of small intestine: Secondary | ICD-10-CM | POA: Diagnosis not present

## 2022-08-23 DIAGNOSIS — I2510 Atherosclerotic heart disease of native coronary artery without angina pectoris: Secondary | ICD-10-CM | POA: Diagnosis not present

## 2022-08-23 DIAGNOSIS — I700 Atherosclerosis of aorta: Secondary | ICD-10-CM | POA: Diagnosis not present

## 2022-08-23 DIAGNOSIS — Z23 Encounter for immunization: Secondary | ICD-10-CM | POA: Diagnosis not present

## 2022-08-23 DIAGNOSIS — E782 Mixed hyperlipidemia: Secondary | ICD-10-CM | POA: Diagnosis not present

## 2022-08-23 DIAGNOSIS — N1832 Chronic kidney disease, stage 3b: Secondary | ICD-10-CM | POA: Diagnosis not present

## 2022-09-14 DIAGNOSIS — R6889 Other general symptoms and signs: Secondary | ICD-10-CM | POA: Diagnosis not present

## 2022-09-15 ENCOUNTER — Telehealth: Payer: Self-pay | Admitting: *Deleted

## 2022-09-15 ENCOUNTER — Inpatient Hospital Stay: Payer: Medicare Other | Attending: Nurse Practitioner

## 2022-09-15 DIAGNOSIS — C49A3 Gastrointestinal stromal tumor of small intestine: Secondary | ICD-10-CM | POA: Diagnosis not present

## 2022-09-15 DIAGNOSIS — D649 Anemia, unspecified: Secondary | ICD-10-CM

## 2022-09-15 LAB — CBC WITH DIFFERENTIAL (CANCER CENTER ONLY)
Abs Immature Granulocytes: 0.01 10*3/uL (ref 0.00–0.07)
Basophils Absolute: 0 10*3/uL (ref 0.0–0.1)
Basophils Relative: 0 %
Eosinophils Absolute: 0.1 10*3/uL (ref 0.0–0.5)
Eosinophils Relative: 1 %
HCT: 26.4 % — ABNORMAL LOW (ref 39.0–52.0)
Hemoglobin: 8.7 g/dL — ABNORMAL LOW (ref 13.0–17.0)
Immature Granulocytes: 0 %
Lymphocytes Relative: 18 %
Lymphs Abs: 0.9 10*3/uL (ref 0.7–4.0)
MCH: 35.5 pg — ABNORMAL HIGH (ref 26.0–34.0)
MCHC: 33 g/dL (ref 30.0–36.0)
MCV: 107.8 fL — ABNORMAL HIGH (ref 80.0–100.0)
Monocytes Absolute: 0.5 10*3/uL (ref 0.1–1.0)
Monocytes Relative: 9 %
Neutro Abs: 3.7 10*3/uL (ref 1.7–7.7)
Neutrophils Relative %: 72 %
Platelet Count: 244 10*3/uL (ref 150–400)
RBC: 2.45 MIL/uL — ABNORMAL LOW (ref 4.22–5.81)
RDW: 13 % (ref 11.5–15.5)
WBC Count: 5.2 10*3/uL (ref 4.0–10.5)
nRBC: 0 % (ref 0.0–0.2)

## 2022-09-15 NOTE — Telephone Encounter (Signed)
Son had left message that patient frequently feels cold and he is concerned. Called [name] [name] and he reports he feels cold in daytime, which is new. He also feels weaker with more fatigue. No shortness of breath. No bleeding noted in stool or urine. BP at home is normal at ~ 145/55. Saw PCP about this who said it is due to the weather change. Inquired if he would like his CBC checked today and he does. Will come in at 1:30 and wait in lobby for results.

## 2022-09-15 NOTE — Telephone Encounter (Signed)
Mr. Kandel came in for CBC today. Hgb is lower at 8.7, but no transfusion needed. He will f/u as scheduled.

## 2022-09-29 ENCOUNTER — Inpatient Hospital Stay: Payer: Medicare Other | Attending: Nurse Practitioner

## 2022-09-29 ENCOUNTER — Inpatient Hospital Stay: Payer: Medicare Other

## 2022-09-29 ENCOUNTER — Inpatient Hospital Stay (HOSPITAL_BASED_OUTPATIENT_CLINIC_OR_DEPARTMENT_OTHER): Payer: Medicare Other | Admitting: Oncology

## 2022-09-29 VITALS — BP 136/52 | HR 64 | Temp 98.4°F | Resp 16 | Wt 159.6 lb

## 2022-09-29 DIAGNOSIS — N289 Disorder of kidney and ureter, unspecified: Secondary | ICD-10-CM | POA: Diagnosis not present

## 2022-09-29 DIAGNOSIS — C786 Secondary malignant neoplasm of retroperitoneum and peritoneum: Secondary | ICD-10-CM | POA: Diagnosis not present

## 2022-09-29 DIAGNOSIS — C49A3 Gastrointestinal stromal tumor of small intestine: Secondary | ICD-10-CM | POA: Insufficient documentation

## 2022-09-29 DIAGNOSIS — D649 Anemia, unspecified: Secondary | ICD-10-CM

## 2022-09-29 DIAGNOSIS — N183 Chronic kidney disease, stage 3 unspecified: Secondary | ICD-10-CM

## 2022-09-29 DIAGNOSIS — D6481 Anemia due to antineoplastic chemotherapy: Secondary | ICD-10-CM | POA: Diagnosis not present

## 2022-09-29 DIAGNOSIS — D638 Anemia in other chronic diseases classified elsewhere: Secondary | ICD-10-CM | POA: Diagnosis not present

## 2022-09-29 DIAGNOSIS — E538 Deficiency of other specified B group vitamins: Secondary | ICD-10-CM | POA: Insufficient documentation

## 2022-09-29 LAB — IRON AND TIBC
Iron: 106 ug/dL (ref 45–182)
Saturation Ratios: 32 % (ref 17.9–39.5)
TIBC: 329 ug/dL (ref 250–450)
UIBC: 223 ug/dL

## 2022-09-29 LAB — CBC WITH DIFFERENTIAL (CANCER CENTER ONLY)
Abs Immature Granulocytes: 0.01 10*3/uL (ref 0.00–0.07)
Basophils Absolute: 0 10*3/uL (ref 0.0–0.1)
Basophils Relative: 0 %
Eosinophils Absolute: 0.1 10*3/uL (ref 0.0–0.5)
Eosinophils Relative: 2 %
HCT: 25.5 % — ABNORMAL LOW (ref 39.0–52.0)
Hemoglobin: 8.7 g/dL — ABNORMAL LOW (ref 13.0–17.0)
Immature Granulocytes: 0 %
Lymphocytes Relative: 18 %
Lymphs Abs: 0.9 10*3/uL (ref 0.7–4.0)
MCH: 36.7 pg — ABNORMAL HIGH (ref 26.0–34.0)
MCHC: 34.1 g/dL (ref 30.0–36.0)
MCV: 107.6 fL — ABNORMAL HIGH (ref 80.0–100.0)
Monocytes Absolute: 0.4 10*3/uL (ref 0.1–1.0)
Monocytes Relative: 9 %
Neutro Abs: 3.4 10*3/uL (ref 1.7–7.7)
Neutrophils Relative %: 71 %
Platelet Count: 204 10*3/uL (ref 150–400)
RBC: 2.37 MIL/uL — ABNORMAL LOW (ref 4.22–5.81)
RDW: 12.7 % (ref 11.5–15.5)
WBC Count: 4.8 10*3/uL (ref 4.0–10.5)
nRBC: 0 % (ref 0.0–0.2)

## 2022-09-29 LAB — CMP (CANCER CENTER ONLY)
ALT: 10 U/L (ref 0–44)
AST: 20 U/L (ref 15–41)
Albumin: 3.7 g/dL (ref 3.5–5.0)
Alkaline Phosphatase: 33 U/L — ABNORMAL LOW (ref 38–126)
Anion gap: 9 (ref 5–15)
BUN: 22 mg/dL (ref 8–23)
CO2: 23 mmol/L (ref 22–32)
Calcium: 8.6 mg/dL — ABNORMAL LOW (ref 8.9–10.3)
Chloride: 108 mmol/L (ref 98–111)
Creatinine: 2.11 mg/dL — ABNORMAL HIGH (ref 0.61–1.24)
GFR, Estimated: 29 mL/min — ABNORMAL LOW (ref >60)
Glucose, Bld: 110 mg/dL — ABNORMAL HIGH (ref 70–99)
Potassium: 3.7 mmol/L (ref 3.5–5.1)
Sodium: 140 mmol/L (ref 135–145)
Total Bilirubin: 0.5 mg/dL (ref 0.3–1.2)
Total Protein: 6.2 g/dL — ABNORMAL LOW (ref 6.5–8.1)

## 2022-09-29 LAB — MAGNESIUM: Magnesium: 2 mg/dL (ref 1.7–2.4)

## 2022-09-29 LAB — FERRITIN: Ferritin: 66 ng/mL (ref 24–336)

## 2022-09-29 MED ORDER — FERROUS SULFATE 325 (65 FE) MG PO TBEC: 325.0000 mg | DELAYED_RELEASE_TABLET | Freq: Two times a day (BID) | ORAL | 3 refills | Status: DC

## 2022-09-29 MED ORDER — DARBEPOETIN ALFA 100 MCG/0.5ML IJ SOSY
100.0000 ug | PREFILLED_SYRINGE | Freq: Once | INTRAMUSCULAR | Status: AC
  Administered 2022-09-29: 100 ug via SUBCUTANEOUS
  Filled 2022-09-29: qty 0.5

## 2022-09-29 NOTE — Progress Notes (Signed)
[place] OFFICE PROGRESS NOTE   Diagnosis: Gastrointestinal stromal tumor, anemia  INTERVAL HISTORY:   Mr. Lazarz returns as scheduled.  He continues [place].  Occasional diarrhea.  No abdominal pain.  He feels "cold ".  Objective:  Vital signs in last 24 hours:  Blood pressure (!) 136/52, pulse 64, temperature 98.4 F (36.9 C), temperature source Oral, resp. rate 16, weight 159 lb 9.6 oz (72.4 kg), SpO2 100 %.    HEENT: No thrush or ulcers Lungs: Cardio: Regular rate and rhythm GI: No hepatosplenomegaly, nontender, no mass Vascular: Trac edema at the right greater than left lower leg  Skin: No rash  Portacath/PICC-without erythema  Lab Results:  Lab Results  Component Value Date   WBC 4.8 09/29/2022   HGB 8.7 (L) 09/29/2022   HCT 25.5 (L) 09/29/2022   MCV 107.6 (H) 09/29/2022   PLT 204 09/29/2022   NEUTROABS 3.4 09/29/2022    CMP  Lab Results  Component Value Date   NA 140 09/29/2022   K 3.7 09/29/2022   CL 108 09/29/2022   CO2 23 09/29/2022   GLUCOSE 110 (H) 09/29/2022   BUN 22 09/29/2022   CREATININE 2.11 (H) 09/29/2022   CALCIUM 8.6 (L) 09/29/2022   PROT 6.2 (L) 09/29/2022   ALBUMIN 3.7 09/29/2022   AST 20 09/29/2022   ALT 10 09/29/2022   ALKPHOS 33 (L) 09/29/2022   BILITOT 0.5 09/29/2022   GFRNONAA 29 (L) 09/29/2022   GFRAA 51 (L) 08/05/2020   Medications: I have reviewed the patient's current medications.   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 CT 10/16/2014 consistent with extensive carcinomatosis, CT-guided biopsy of an omental mass 10/21/2014 confirmed a gastrointestinal stromal tumor Initiation of Gleevec 10/31/2014 CT 01/13/2015 revealed improvement in the peritoneal and omental metastatic disease CT 05/05/2015 with no progression of omental/peritoneal metastatic disease, increased ascites, and appendix inflammation CT 07/07/2016-no abscess, mild  perihepatic and left pericolic ascites and mesenteric edema CT of the pelvis 09/20/2016, no residual abscess, no ascites CT abdomen pelvis renal stones study 04/03/2017-partial small bowel obstruction, potentially related to right inguinal hernia CT abdomen/pelvis 08/18/2017-possible cholecystitis, 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  Cholelithiasis. CT renal stone study 06/04/2021-large right and small left inguinal hernias, no ascites     2. History of anemia, status post a nondiagnostic bone marrow biopsy 10/21/2014   3. NSTEMI March 2014   4. Admission 05/06/2015 with acute onset right abdomen pain-potentially related to acute appendicitis versus pain from carcinomatosis CT 05/13/2015 consistent with a right lower abdomen abscess, status post catheter drainage by interventional radiology 05/13/2015 Follow-up CT 05/19/2015 showed resolution of the abscess. Follow-up evaluation in interventional radiology 06/01/2015 showed the abscess cavity had resolved. The abscess drainage catheter communicated with the cecum via the appendix. The catheter was left in place. CT abdomen/pelvis 06/08/2015 showed the right pelvic drain in place without recurrent or residual surrounding fluid collection. Peritoneal metastasis with slight increase in small to moderate volume of abdominal pelvic ascites. Drainage catheter injection 06/12/2015 showed residual tiny fistulous connection with the end of the decompressed abscess cavity within the right lower abdominal quadrant and the residual lumen of the appendix. Paracentesis 06/12/2015 with 5 liters  of fluid  removed Paracentesis 06/19/2015-negative cytology and culture Removal of abscess drainage catheter 06/23/2015 CT 07/26/2015 with increased right abdominal wall inflammation and increased fluid collection at site of previous right abdomen abscess, ascites, and enlargement/inflammation of the appendix Placement of right lower quadrant percutaneous drainage catheter 07/27/2015. Contrast injection confirmed persistent fistulous connection with the ill-defined fluid collection and the residual appendix and cecum. Paracentesis with 2 L of fluid removed. Follow-up CT abdomen/pelvis 08/05/2015 with small to moderate left pleural effusion and moderate to large volume ascites with both appearing slightly decreased since the prior study. Right lower quadrant drainage catheter in place. No residual fluid collection around the drain or in the adjacent abdomen or pelvis. 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 Drainage catheter left in place. 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 [name]     5. C. difficile colitis 05/10/2015, [facility] Cranker 2019   6.  Right leg edema 12/18/2015. Negative venous Doppler.   7.  Renal insufficiency   8.  Urinary retention June 2022-temporary Foley catheter placed, started on Flomax   9.  B12 deficiency-oral B12 initiated 12/15/2021   10.  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 Hellmer appears stable.  Is in clinical remission from the gastrointestinal stromal tumor.  He has persistent anemia.  The  anemia is likely secondary to renal insufficiency, chronic disease, and imatinib.  He agrees to a trial of Aranesp.  We reviewed potential toxicities associated with Aranesp including the chance of hypertension, venous/arterial thromboembolic disease, and an adverse outcome from cancer.  He agrees to proceed.  He will take ferrous sulfate while on Aranesp.  Mr. Dilauro will continue daily Gleevec.  He will return for Aranesp in 2 weeks and an office visit in 4 weeks.  Betsy Coder, MD  09/29/2022  10:58 AM

## 2022-09-29 NOTE — Patient Instructions (Signed)
Start taking ferrous sulfate  (iron) 325 mg twice daily with meals. This is over-the-counter. May cause constipation

## 2022-09-29 NOTE — Addendum Note (Signed)
Addended by: Tania Ade on: 09/29/2022 11:22 AM   Modules accepted: Orders

## 2022-10-05 ENCOUNTER — Other Ambulatory Visit: Payer: Self-pay | Admitting: *Deleted

## 2022-10-05 DIAGNOSIS — D649 Anemia, unspecified: Secondary | ICD-10-CM

## 2022-10-05 DIAGNOSIS — N183 Chronic kidney disease, stage 3 unspecified: Secondary | ICD-10-CM

## 2022-10-13 ENCOUNTER — Inpatient Hospital Stay: Payer: Medicare Other

## 2022-10-13 VITALS — BP 148/58 | HR 61 | Temp 97.8°F | Resp 18

## 2022-10-13 DIAGNOSIS — D6481 Anemia due to antineoplastic chemotherapy: Secondary | ICD-10-CM | POA: Diagnosis not present

## 2022-10-13 DIAGNOSIS — N289 Disorder of kidney and ureter, unspecified: Secondary | ICD-10-CM | POA: Diagnosis not present

## 2022-10-13 DIAGNOSIS — E538 Deficiency of other specified B group vitamins: Secondary | ICD-10-CM | POA: Diagnosis not present

## 2022-10-13 DIAGNOSIS — C786 Secondary malignant neoplasm of retroperitoneum and peritoneum: Secondary | ICD-10-CM | POA: Diagnosis not present

## 2022-10-13 DIAGNOSIS — D649 Anemia, unspecified: Secondary | ICD-10-CM

## 2022-10-13 DIAGNOSIS — N183 Chronic kidney disease, stage 3 unspecified: Secondary | ICD-10-CM

## 2022-10-13 DIAGNOSIS — C49A3 Gastrointestinal stromal tumor of small intestine: Secondary | ICD-10-CM | POA: Diagnosis not present

## 2022-10-13 DIAGNOSIS — D638 Anemia in other chronic diseases classified elsewhere: Secondary | ICD-10-CM | POA: Diagnosis not present

## 2022-10-13 LAB — CBC WITH DIFFERENTIAL (CANCER CENTER ONLY)
Abs Immature Granulocytes: 0.01 10*3/uL (ref 0.00–0.07)
Basophils Absolute: 0 10*3/uL (ref 0.0–0.1)
Basophils Relative: 0 %
Eosinophils Absolute: 0.1 10*3/uL (ref 0.0–0.5)
Eosinophils Relative: 1 %
HCT: 30.7 % — ABNORMAL LOW (ref 39.0–52.0)
Hemoglobin: 9.9 g/dL — ABNORMAL LOW (ref 13.0–17.0)
Immature Granulocytes: 0 %
Lymphocytes Relative: 18 %
Lymphs Abs: 0.9 10*3/uL (ref 0.7–4.0)
MCH: 36.1 pg — ABNORMAL HIGH (ref 26.0–34.0)
MCHC: 32.2 g/dL (ref 30.0–36.0)
MCV: 112 fL — ABNORMAL HIGH (ref 80.0–100.0)
Monocytes Absolute: 0.6 10*3/uL (ref 0.1–1.0)
Monocytes Relative: 11 %
Neutro Abs: 3.5 10*3/uL (ref 1.7–7.7)
Neutrophils Relative %: 70 %
Platelet Count: 185 10*3/uL (ref 150–400)
RBC: 2.74 MIL/uL — ABNORMAL LOW (ref 4.22–5.81)
RDW: 14.5 % (ref 11.5–15.5)
WBC Count: 5 10*3/uL (ref 4.0–10.5)
nRBC: 0 % (ref 0.0–0.2)

## 2022-10-13 MED ORDER — DARBEPOETIN ALFA 100 MCG/0.5ML IJ SOSY
100.0000 ug | PREFILLED_SYRINGE | Freq: Once | INTRAMUSCULAR | Status: AC
  Administered 2022-10-13: 100 ug via SUBCUTANEOUS
  Filled 2022-10-13: qty 0.5

## 2022-10-13 NOTE — Patient Instructions (Signed)

## 2022-10-26 ENCOUNTER — Other Ambulatory Visit (HOSPITAL_COMMUNITY): Payer: Self-pay

## 2022-10-27 ENCOUNTER — Encounter: Payer: Self-pay | Admitting: Nurse Practitioner

## 2022-10-27 ENCOUNTER — Telehealth: Payer: Self-pay

## 2022-10-27 ENCOUNTER — Inpatient Hospital Stay: Payer: Medicare Other

## 2022-10-27 ENCOUNTER — Inpatient Hospital Stay: Payer: Medicare Other | Attending: Nurse Practitioner | Admitting: Nurse Practitioner

## 2022-10-27 VITALS — BP 160/61 | HR 70 | Temp 98.2°F | Resp 18 | Ht 67.0 in | Wt 165.0 lb

## 2022-10-27 DIAGNOSIS — D649 Anemia, unspecified: Secondary | ICD-10-CM

## 2022-10-27 DIAGNOSIS — N289 Disorder of kidney and ureter, unspecified: Secondary | ICD-10-CM | POA: Diagnosis not present

## 2022-10-27 DIAGNOSIS — C49A9 Gastrointestinal stromal tumor of other sites: Secondary | ICD-10-CM | POA: Diagnosis not present

## 2022-10-27 DIAGNOSIS — D6481 Anemia due to antineoplastic chemotherapy: Secondary | ICD-10-CM | POA: Diagnosis not present

## 2022-10-27 DIAGNOSIS — C49A3 Gastrointestinal stromal tumor of small intestine: Secondary | ICD-10-CM

## 2022-10-27 DIAGNOSIS — I252 Old myocardial infarction: Secondary | ICD-10-CM | POA: Insufficient documentation

## 2022-10-27 DIAGNOSIS — N183 Chronic kidney disease, stage 3 unspecified: Secondary | ICD-10-CM | POA: Diagnosis not present

## 2022-10-27 DIAGNOSIS — D638 Anemia in other chronic diseases classified elsewhere: Secondary | ICD-10-CM | POA: Diagnosis not present

## 2022-10-27 DIAGNOSIS — E538 Deficiency of other specified B group vitamins: Secondary | ICD-10-CM | POA: Insufficient documentation

## 2022-10-27 LAB — CBC WITH DIFFERENTIAL (CANCER CENTER ONLY)
Abs Immature Granulocytes: 0.01 10*3/uL (ref 0.00–0.07)
Basophils Absolute: 0 10*3/uL (ref 0.0–0.1)
Basophils Relative: 0 %
Eosinophils Absolute: 0.1 10*3/uL (ref 0.0–0.5)
Eosinophils Relative: 1 %
HCT: 35.4 % — ABNORMAL LOW (ref 39.0–52.0)
Hemoglobin: 11.5 g/dL — ABNORMAL LOW (ref 13.0–17.0)
Immature Granulocytes: 0 %
Lymphocytes Relative: 14 %
Lymphs Abs: 0.7 10*3/uL (ref 0.7–4.0)
MCH: 36.5 pg — ABNORMAL HIGH (ref 26.0–34.0)
MCHC: 32.5 g/dL (ref 30.0–36.0)
MCV: 112.4 fL — ABNORMAL HIGH (ref 80.0–100.0)
Monocytes Absolute: 0.4 10*3/uL (ref 0.1–1.0)
Monocytes Relative: 8 %
Neutro Abs: 3.7 10*3/uL (ref 1.7–7.7)
Neutrophils Relative %: 77 %
Platelet Count: 192 10*3/uL (ref 150–400)
RBC: 3.15 MIL/uL — ABNORMAL LOW (ref 4.22–5.81)
RDW: 14.7 % (ref 11.5–15.5)
WBC Count: 4.9 10*3/uL (ref 4.0–10.5)
nRBC: 0 % (ref 0.0–0.2)

## 2022-10-27 NOTE — Progress Notes (Signed)
Patient seen by Ned Card NP today  Vitals are within treatment parameters.  Labs reviewed by Ned Card NP and are not all within treatment parameters. Hemoglobin 11.5 d/gL today, aranesp not indicated.   Per physician team, patient will not be receiving treatment today.

## 2022-10-27 NOTE — Telephone Encounter (Signed)
Oral Oncology Patient Advocate Encounter   Received notification that patient is due for re-enrollment for assistance for Gleevec through [employer].   Re-enrollment process has been initiated and will be submitted upon completion of necessary documents.  [employer]' phone number [phone].   I will continue to follow until final determination.  Berdine Addison, [facility] Patient [place]  [phone] (phone) [phone] (fax) 10/27/2022 3:11 PM

## 2022-10-27 NOTE — Progress Notes (Signed)
[place] OFFICE PROGRESS NOTE   Diagnosis: Gastrointestinal stromal tumor, anemia  INTERVAL HISTORY:   [name] returns as scheduled.  He continues [place].  He completed Aranesp 100 mcg on 09/29/2022 and 10/13/2022.  He notes improvement in his energy level.  No diarrhea.  No rash.  No nausea or vomiting.  He feels lower extremity edema is stable.  He denies shortness of breath.  Objective:  Vital signs in last 24 hours:  Blood pressure (!) 160/61, pulse 70, temperature 98.2 F (36.8 C), temperature source Oral, resp. rate 18, height _0  (1.702 m), weight 165 lb (74.8 kg), SpO2 100 %.    HEENT: No thrush or ulcers.  Bilateral periorbital edema. Resp: Lungs clear bilaterally. Cardio: Regular rate and rhythm. GI: Abdomen soft and nontender.  No hepatosplenomegaly. Vascular: Firm trace edema right greater than left lower leg.    Lab Results:  Lab Results  Component Value Date   WBC 4.9 10/27/2022   HGB 11.5 (L) 10/27/2022   HCT 35.4 (L) 10/27/2022   MCV 112.4 (H) 10/27/2022   PLT 192 10/27/2022   NEUTROABS 3.7 10/27/2022    Imaging:  No results found.  Medications: I have reviewed the patient's current medications.  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 CT 10/16/2014 consistent with extensive carcinomatosis, CT-guided biopsy of an omental mass 10/21/2014 confirmed a gastrointestinal stromal tumor Initiation of Gleevec 10/31/2014 CT 01/13/2015 revealed improvement in the peritoneal and omental metastatic disease CT 05/05/2015 with no progression of omental/peritoneal metastatic disease, increased ascites, and appendix inflammation CT 07/07/2016-no abscess, mild perihepatic and left pericolic ascites and mesenteric edema CT of the pelvis 09/20/2016, no residual abscess, no ascites CT abdomen pelvis renal stones study 04/03/2017-partial small bowel obstruction, potentially related to  right inguinal hernia CT abdomen/pelvis 08/18/2017-possible cholecystitis, 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  Cholelithiasis. CT renal stone study 06/04/2021-large right and small left inguinal hernias, no ascites     2. History of anemia, status post a nondiagnostic bone marrow biopsy 10/21/2014   3. NSTEMI March 2014   4. Admission 05/06/2015 with acute onset right abdomen pain-potentially related to acute appendicitis versus pain from carcinomatosis CT 05/13/2015 consistent with a right lower abdomen abscess, status post catheter drainage by interventional radiology 05/13/2015 Follow-up CT 05/19/2015 showed resolution of the abscess. Follow-up evaluation in interventional radiology 06/01/2015 showed the abscess cavity had resolved. The abscess drainage catheter communicated with the cecum via the appendix. The catheter was left in place. CT abdomen/pelvis 06/08/2015 showed the right pelvic drain in place without recurrent or residual surrounding fluid collection. Peritoneal metastasis with slight increase in small to moderate volume of abdominal pelvic ascites. Drainage catheter injection 06/12/2015 showed residual tiny fistulous connection with the end of the decompressed abscess cavity within the right lower abdominal quadrant and the residual lumen of the appendix. Paracentesis 06/12/2015 with 5 liters of fluid removed Paracentesis 06/19/2015-negative cytology and culture Removal of abscess drainage catheter 06/23/2015 CT 07/26/2015 with increased right abdominal wall inflammation and increased fluid collection at site of previous right abdomen  abscess, ascites, and enlargement/inflammation of the appendix Placement of right lower quadrant percutaneous drainage catheter 07/27/2015. Contrast injection confirmed persistent fistulous connection with  the ill-defined fluid collection and the residual appendix and cecum. Paracentesis with 2 L of fluid removed. Follow-up CT abdomen/pelvis 08/05/2015 with small to moderate left pleural effusion and moderate to large volume ascites with both appearing slightly decreased since the prior study. Right lower quadrant drainage catheter in place. No residual fluid collection around the drain or in the adjacent abdomen or pelvis. 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 Drainage catheter left in place. 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 [name]     5. C. difficile colitis 05/10/2015, Lamount Cranker 2019   6.  Right leg edema 12/18/2015. Negative venous Doppler.   7.  Renal insufficiency   8.  Urinary retention June 2022-temporary Foley catheter placed, started on Flomax   9.  B12 deficiency-oral B12 initiated 12/15/2021   10.  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 appears stable.  There is no clinical evidence for progression of the gastrointestinal stromal tumor.  He will continue Gleevec as he is currently taking.  We reviewed the CBC from today.  Hemoglobin has improved since beginning Aranesp.  We are holding today's injection.  He will return for CBC and possible injection in 2 weeks.  Weight is a little  higher today.  He will continue to monitor and follow-up with PCP regarding his diuretic regimen.  We will see him in follow-up in 6 weeks.  He will contact the office in the interim with any problems.    Ned Card ANP/GNP-BC   10/27/2022  11:49 AM

## 2022-10-28 ENCOUNTER — Other Ambulatory Visit: Payer: Self-pay | Admitting: *Deleted

## 2022-10-28 DIAGNOSIS — D649 Anemia, unspecified: Secondary | ICD-10-CM

## 2022-10-31 NOTE — Telephone Encounter (Signed)
Received patient signature and financial documents. Will submit once I have MD signature.   Berdine Addison, [facility] Patient [place]  [phone] (phone) [phone] (fax) 10/31/2022 3:18 PM

## 2022-11-03 ENCOUNTER — Other Ambulatory Visit (HOSPITAL_COMMUNITY): Payer: Self-pay

## 2022-11-08 DIAGNOSIS — D509 Iron deficiency anemia, unspecified: Secondary | ICD-10-CM | POA: Diagnosis not present

## 2022-11-08 DIAGNOSIS — R600 Localized edema: Secondary | ICD-10-CM | POA: Diagnosis not present

## 2022-11-08 DIAGNOSIS — I10 Essential (primary) hypertension: Secondary | ICD-10-CM | POA: Diagnosis not present

## 2022-11-08 DIAGNOSIS — N183 Chronic kidney disease, stage 3 unspecified: Secondary | ICD-10-CM | POA: Diagnosis not present

## 2022-11-09 ENCOUNTER — Inpatient Hospital Stay: Payer: Medicare Other

## 2022-11-09 DIAGNOSIS — D638 Anemia in other chronic diseases classified elsewhere: Secondary | ICD-10-CM | POA: Diagnosis not present

## 2022-11-09 DIAGNOSIS — D6481 Anemia due to antineoplastic chemotherapy: Secondary | ICD-10-CM | POA: Diagnosis not present

## 2022-11-09 DIAGNOSIS — N183 Chronic kidney disease, stage 3 unspecified: Secondary | ICD-10-CM

## 2022-11-09 DIAGNOSIS — D649 Anemia, unspecified: Secondary | ICD-10-CM

## 2022-11-09 DIAGNOSIS — N289 Disorder of kidney and ureter, unspecified: Secondary | ICD-10-CM | POA: Diagnosis not present

## 2022-11-09 DIAGNOSIS — C49A9 Gastrointestinal stromal tumor of other sites: Secondary | ICD-10-CM | POA: Diagnosis not present

## 2022-11-09 DIAGNOSIS — I252 Old myocardial infarction: Secondary | ICD-10-CM | POA: Diagnosis not present

## 2022-11-09 DIAGNOSIS — E538 Deficiency of other specified B group vitamins: Secondary | ICD-10-CM | POA: Diagnosis not present

## 2022-11-09 DIAGNOSIS — C49A3 Gastrointestinal stromal tumor of small intestine: Secondary | ICD-10-CM

## 2022-11-09 LAB — CBC WITH DIFFERENTIAL (CANCER CENTER ONLY)
Abs Immature Granulocytes: 0.02 10*3/uL (ref 0.00–0.07)
Basophils Absolute: 0 10*3/uL (ref 0.0–0.1)
Basophils Relative: 0 %
Eosinophils Absolute: 0.1 10*3/uL (ref 0.0–0.5)
Eosinophils Relative: 1 %
HCT: 34.5 % — ABNORMAL LOW (ref 39.0–52.0)
Hemoglobin: 11.5 g/dL — ABNORMAL LOW (ref 13.0–17.0)
Immature Granulocytes: 0 %
Lymphocytes Relative: 18 %
Lymphs Abs: 0.9 10*3/uL (ref 0.7–4.0)
MCH: 36.3 pg — ABNORMAL HIGH (ref 26.0–34.0)
MCHC: 33.3 g/dL (ref 30.0–36.0)
MCV: 108.8 fL — ABNORMAL HIGH (ref 80.0–100.0)
Monocytes Absolute: 0.5 10*3/uL (ref 0.1–1.0)
Monocytes Relative: 10 %
Neutro Abs: 3.8 10*3/uL (ref 1.7–7.7)
Neutrophils Relative %: 71 %
Platelet Count: 181 10*3/uL (ref 150–400)
RBC: 3.17 MIL/uL — ABNORMAL LOW (ref 4.22–5.81)
RDW: 13.3 % (ref 11.5–15.5)
WBC Count: 5.3 10*3/uL (ref 4.0–10.5)
nRBC: 0 % (ref 0.0–0.2)

## 2022-11-09 NOTE — Progress Notes (Signed)
Patient's Hgb 11.5 today.  Per parameters for aranesp, today's dose to be held.  Patient made aware and verbalized understanding.  Patient's next appointment was confirmed and patient was instructed to contact office if he had any questions or concerns.

## 2022-11-14 ENCOUNTER — Telehealth: Payer: Self-pay

## 2022-11-14 ENCOUNTER — Other Ambulatory Visit: Payer: Self-pay | Admitting: *Deleted

## 2022-11-14 MED ORDER — IMATINIB MESYLATE 400 MG PO TABS: 400.0000 mg | ORAL_TABLET | Freq: Every day | ORAL | 2 refills | Status: DC

## 2022-11-14 NOTE — Telephone Encounter (Signed)
Patient left message requesting refill on gleevec.

## 2022-11-14 NOTE — Telephone Encounter (Signed)
Patient requesting refill of Gleevec be sent to [facility]. Message sent to provider's office.  Berdine Addison, [facility] Patient [place]  [phone] (phone) [phone] (fax) 11/14/2022 10:55 AM

## 2022-11-17 NOTE — Telephone Encounter (Signed)
Oral Oncology Patient Advocate Encounter   Submitted application for assistance for Gleevec to [employer].   Application submitted via e-fax to [facility]' phone number [phone].   I will continue to check the status until final determination.   Berdine Addison, [facility] Patient [place]  [phone] (phone) [phone] (fax) 11/17/2022 8:21 AM

## 2022-11-22 ENCOUNTER — Emergency Department (HOSPITAL_BASED_OUTPATIENT_CLINIC_OR_DEPARTMENT_OTHER): Payer: Medicare Other

## 2022-11-22 ENCOUNTER — Other Ambulatory Visit: Payer: Self-pay

## 2022-11-22 ENCOUNTER — Emergency Department (HOSPITAL_BASED_OUTPATIENT_CLINIC_OR_DEPARTMENT_OTHER)
Admission: EM | Admit: 2022-11-22 | Discharge: 2022-11-22 | Disposition: A | Discharge status: Home / Self Care | Payer: Medicare Other | Attending: Emergency Medicine | Admitting: Emergency Medicine

## 2022-11-22 ENCOUNTER — Encounter (HOSPITAL_BASED_OUTPATIENT_CLINIC_OR_DEPARTMENT_OTHER): Payer: Self-pay | Admitting: Emergency Medicine

## 2022-11-22 DIAGNOSIS — K8070 Calculus of gallbladder and bile duct without cholecystitis without obstruction: Secondary | ICD-10-CM | POA: Diagnosis not present

## 2022-11-22 DIAGNOSIS — K8020 Calculus of gallbladder without cholecystitis without obstruction: Secondary | ICD-10-CM | POA: Diagnosis not present

## 2022-11-22 DIAGNOSIS — R110 Nausea: Secondary | ICD-10-CM | POA: Diagnosis not present

## 2022-11-22 DIAGNOSIS — M7989 Other specified soft tissue disorders: Secondary | ICD-10-CM | POA: Diagnosis not present

## 2022-11-22 DIAGNOSIS — J9811 Atelectasis: Secondary | ICD-10-CM | POA: Diagnosis not present

## 2022-11-22 DIAGNOSIS — R262 Difficulty in walking, not elsewhere classified: Secondary | ICD-10-CM | POA: Diagnosis not present

## 2022-11-22 DIAGNOSIS — Z79899 Other long term (current) drug therapy: Secondary | ICD-10-CM | POA: Insufficient documentation

## 2022-11-22 DIAGNOSIS — R609 Edema, unspecified: Secondary | ICD-10-CM | POA: Diagnosis not present

## 2022-11-22 DIAGNOSIS — K5730 Diverticulosis of large intestine without perforation or abscess without bleeding: Secondary | ICD-10-CM | POA: Diagnosis not present

## 2022-11-22 DIAGNOSIS — I2510 Atherosclerotic heart disease of native coronary artery without angina pectoris: Secondary | ICD-10-CM | POA: Insufficient documentation

## 2022-11-22 DIAGNOSIS — K824 Cholesterolosis of gallbladder: Secondary | ICD-10-CM | POA: Diagnosis not present

## 2022-11-22 DIAGNOSIS — J90 Pleural effusion, not elsewhere classified: Secondary | ICD-10-CM | POA: Diagnosis not present

## 2022-11-22 LAB — TROPONIN I (HIGH SENSITIVITY)
Troponin I (High Sensitivity): 38 ng/L — ABNORMAL HIGH (ref <18)
Troponin I (High Sensitivity): 36 ng/L — ABNORMAL HIGH (ref <18)

## 2022-11-22 LAB — COMPREHENSIVE METABOLIC PANEL
ALT: 12 U/L (ref 0–44)
AST: 25 U/L (ref 15–41)
Albumin: 4 g/dL (ref 3.5–5.0)
Alkaline Phosphatase: 41 U/L (ref 38–126)
Anion gap: 10 (ref 5–15)
BUN: 21 mg/dL (ref 8–23)
CO2: 26 mmol/L (ref 22–32)
Calcium: 8.5 mg/dL — ABNORMAL LOW (ref 8.9–10.3)
Chloride: 109 mmol/L (ref 98–111)
Creatinine, Ser: 1.89 mg/dL — ABNORMAL HIGH (ref 0.61–1.24)
GFR, Estimated: 33 mL/min — ABNORMAL LOW (ref >60)
Glucose, Bld: 109 mg/dL — ABNORMAL HIGH (ref 70–99)
Potassium: 3.7 mmol/L (ref 3.5–5.1)
Sodium: 145 mmol/L (ref 135–145)
Total Bilirubin: 0.5 mg/dL (ref 0.3–1.2)
Total Protein: 6.3 g/dL — ABNORMAL LOW (ref 6.5–8.1)

## 2022-11-22 LAB — CBC WITH DIFFERENTIAL/PLATELET
Abs Immature Granulocytes: 0.01 10*3/uL (ref 0.00–0.07)
Basophils Absolute: 0 10*3/uL (ref 0.0–0.1)
Basophils Relative: 0 %
Eosinophils Absolute: 0.1 10*3/uL (ref 0.0–0.5)
Eosinophils Relative: 2 %
HCT: 32.2 % — ABNORMAL LOW (ref 39.0–52.0)
Hemoglobin: 10.8 g/dL — ABNORMAL LOW (ref 13.0–17.0)
Immature Granulocytes: 0 %
Lymphocytes Relative: 22 %
Lymphs Abs: 0.8 10*3/uL (ref 0.7–4.0)
MCH: 36.1 pg — ABNORMAL HIGH (ref 26.0–34.0)
MCHC: 33.5 g/dL (ref 30.0–36.0)
MCV: 107.7 fL — ABNORMAL HIGH (ref 80.0–100.0)
Monocytes Absolute: 0.3 10*3/uL (ref 0.1–1.0)
Monocytes Relative: 8 %
Neutro Abs: 2.4 10*3/uL (ref 1.7–7.7)
Neutrophils Relative %: 68 %
Platelets: 183 10*3/uL (ref 150–400)
RBC: 2.99 MIL/uL — ABNORMAL LOW (ref 4.22–5.81)
RDW: 12.9 % (ref 11.5–15.5)
WBC: 3.7 10*3/uL — ABNORMAL LOW (ref 4.0–10.5)
nRBC: 0 % (ref 0.0–0.2)

## 2022-11-22 LAB — LIPASE, BLOOD: Lipase: 58 U/L — ABNORMAL HIGH (ref 11–51)

## 2022-11-22 LAB — BRAIN NATRIURETIC PEPTIDE: B Natriuretic Peptide: 510.2 pg/mL — ABNORMAL HIGH (ref 0.0–100.0)

## 2022-11-22 MED ORDER — FUROSEMIDE 10 MG/ML IJ SOLN
40.0000 mg | Freq: Once | INTRAMUSCULAR | Status: AC
  Administered 2022-11-22: 40 mg via INTRAVENOUS
  Filled 2022-11-22: qty 4

## 2022-11-22 MED ORDER — IOHEXOL 300 MG/ML  SOLN
100.0000 mL | Freq: Once | INTRAMUSCULAR | Status: AC | PRN
  Administered 2022-11-22: 75 mL via INTRAVENOUS

## 2022-11-22 NOTE — ED Provider Notes (Signed)
[place] EMERGENCY DEPT Provider Note   CSN: [id number] Arrival date & time: 11/22/22  1062     History  Chief Complaint  Patient presents with   Leg Swelling    [name] [name] is a 88 y.o. male.  Neck patient brought in by EMS complaining of bilateral lower leg swelling for 3 days.  And patient stating that he is not able to sleep more than about 3 or 4 hours a night talk to his primary care doctor about that he was not necessarily interested in making any changes patient is really bothered by this.  Patient denies any chest pain denies any shortness of breath denies any nausea or vomiting.  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  And they were considering to have his CBC rechecked in 2 weeks but that was what they stated on November 9 and were way past 2 weeks at this point time.  Patient's chemo treatment is an oral pill that he takes daily.  Past medical history significant for gastrointestinal stromal tumors as we mention to the small bowel hyperlipidemia non-STEMI coronary artery disease history of shortness of breath acute respiratory failure in 17 intraperitoneal hemorrhage in 2015 as well.  Patient never used tobacco products.       Home Medications Prior to Admission medications   Medication Sig Start Date End Date Taking? Authorizing Provider  amLODipine (NORVASC) 10 MG tablet Take 10 mg by mouth daily. 08/23/22   [provider]  cyanocobalamin 1000 MCG tablet Take 1,000 mcg by mouth daily.    [provider]  ferrous sulfate 325 (65 FE) MG EC tablet Take 1 tablet (325 mg total) by mouth 2 (two) times daily with a meal. 09/29/22   Ladell Pier, MD  furosemide (LASIX) 20 MG tablet Take 2 tablets (40 mg total) by  mouth daily for 3 days. 05/05/22 09/29/22  Deno Etienne, DO  glycerin adult 2 g suppository Place 1 suppository rectally as needed for constipation. Patient not taking: Reported on 08/02/2022 11/05/21   Regan Lemming, MD  imatinib (GLEEVEC) 400 MG tablet Take 1 tablet (400 mg total) by mouth daily. Take with meals and large glass of water.Caution:Chemotherapy. 11/14/22   Ladell Pier, MD  loperamide (IMODIUM) 2 MG capsule Take 1 capsule (2 mg total) by mouth 4 (four) times daily as needed for diarrhea or loose stools. Patient not taking: Reported on 12/30/2021 10/27/21   Tonye Pearson, PA-C  megestrol (MEGACE) 40 MG/ML suspension Take by mouth. 11/09/22   [provider]  metoprolol tartrate (LOPRESSOR) 25 MG tablet Take 1 tablet (25 mg total) by mouth 2 (two) times daily. 03/06/13   Caren Griffins, MD  Potassium Chloride (KLOR-CON 10 PO) Take 10 mEq by mouth daily.    [provider]  simvastatin (ZOCOR) 20 MG tablet Take 20 mg by mouth daily. 10/21/20   [provider]  tamsulosin (FLOMAX) 0.4 MG CAPS capsule Take 0.4 mg by mouth daily. 02/04/20   [provider]      Allergies    Patient has no known allergies.    Review of Systems   Review of Systems  Constitutional:  Negative for chills and fever.  HENT:  Negative for ear pain and sore throat.  Eyes:  Negative for pain and visual disturbance.  Respiratory:  Negative for cough and shortness of breath.   Cardiovascular:  Positive for leg swelling. Negative for chest pain and palpitations.  Gastrointestinal:  Negative for abdominal pain and vomiting.  Genitourinary:  Negative for dysuria and hematuria.  Musculoskeletal:  Negative for arthralgias and back pain.  Skin:  Negative for color change and rash.  Neurological:  Negative for seizures and syncope.  All other systems reviewed and are negative.   Physical Exam Updated Vital Signs BP (!) 147/62 (BP Location: Right Arm)   Pulse 73   Temp  98.2 F (36.8 C) (Oral)   Resp 17   Ht 1.702 m (_0 )   Wt 74.8 kg   SpO2 97%   BMI 25.83 kg/m  Physical Exam Vitals and nursing note reviewed.  Constitutional:      General: He is not in acute distress.    Appearance: Normal appearance. He is well-developed.  HENT:     Head: Normocephalic and atraumatic.  Eyes:     Extraocular Movements: Extraocular movements intact.     Conjunctiva/sclera: Conjunctivae normal.     Pupils: Pupils are equal, round, and reactive to light.  Cardiovascular:     Rate and Rhythm: Normal rate and regular rhythm.     Heart sounds: No murmur heard. Pulmonary:     Effort: Pulmonary effort is normal. No respiratory distress.     Breath sounds: Normal breath sounds. No wheezing, rhonchi or rales.  Abdominal:     General: There is no distension.     Palpations: Abdomen is soft.     Tenderness: There is no abdominal tenderness. There is no guarding.  Musculoskeletal:        General: No swelling.     Cervical back: Normal range of motion and neck supple.  Skin:    General: Skin is warm and dry.     Capillary Refill: Capillary refill takes less than 2 seconds.  Neurological:     General: No focal deficit present.     Mental Status: He is alert and oriented to person, place, and time.     Cranial Nerves: No cranial nerve deficit.     Sensory: No sensory deficit.  Psychiatric:        Mood and Affect: Mood normal.     ED Results / Procedures / Treatments   Labs (all labs ordered are listed, but only abnormal results are displayed) Labs Reviewed  COMPREHENSIVE METABOLIC PANEL - Abnormal; Notable for the following components:      Result Value   Glucose, Bld 109 (*)    Creatinine, Ser 1.89 (*)    Calcium 8.5 (*)    Total Protein 6.3 (*)    GFR, Estimated 33 (*)    All other components within normal limits  LIPASE, BLOOD - Abnormal; Notable for the following components:   Lipase 58 (*)    All other components within normal limits  BRAIN  NATRIURETIC PEPTIDE - Abnormal; Notable for the following components:   B Natriuretic Peptide 510.2 (*)    All other components within normal limits  CBC WITH DIFFERENTIAL/PLATELET - Abnormal; Notable for the following components:   WBC 3.7 (*)    RBC 2.99 (*)    Hemoglobin 10.8 (*)    HCT 32.2 (*)    MCV 107.7 (*)    MCH 36.1 (*)    All other components within normal limits  TROPONIN I (HIGH SENSITIVITY) - Abnormal; Notable for  the following components:   Troponin I (High Sensitivity) 38 (*)    All other components within normal limits  TROPONIN I (HIGH SENSITIVITY) - Abnormal; Notable for the following components:   Troponin I (High Sensitivity) 36 (*)    All other components within normal limits  CBC WITH DIFFERENTIAL/PLATELET    EKG EKG Interpretation  Date/Time:  Tuesday November 22 2022 08:20:47 EST Ventricular Rate:  72 PR Interval:  148 QRS Duration: 100 QT Interval:  448 QTC Calculation: 491 R Axis:   73 Text Interpretation: Sinus rhythm Probable anteroseptal infarct, old Confirmed by Fredia Sorrow [id number]) on 11/22/2022 8:26:50 AM  Radiology US Abdomen Limited RUQ (LIVER/GB)  Result Date: 11/22/2022 CLINICAL DATA:  Gallstones. EXAM: ULTRASOUND ABDOMEN LIMITED RIGHT UPPER QUADRANT COMPARISON:  CT from earlier today FINDINGS: Gallbladder: Gallstones. The largest stone measures 1.5 cm. There is a gallbladder polyp noted measuring 6 mm. The gallbladder wall appears mildly thickened measuring 3.1 mm. No pericholecystic fluid or sonographic Murphy's sign. Common bile duct: Diameter: 8.9 mm.  No intrahepatic bile duct dilatation. Liver: No focal lesion identified. Within normal limits in parenchymal echogenicity. Portal vein is patent on color Doppler imaging with normal direction of blood flow towards the liver. Other: Right pleural effusion and ascites noted. IMPRESSION: 1. Gallstones and mild gallbladder wall thickening. No pericholecystic fluid or sonographic Murphy's sign.  In the setting of ascites presence of mild gallbladder wall thickening may be nonspecific. 2. Mild dilatation of the common bile duct measuring 8.9 mm. Retrospective review of the CT from earlier today shows multiple stones within the common bile duct. These were also seen on CT from 11/05/2021. 3. Right pleural effusion and ascites. Electronically Signed   By: Kerby Moors M.D.   On: 11/22/2022 12:59   CT CHEST ABDOMEN PELVIS W CONTRAST  Result Date: 11/22/2022 CLINICAL DATA:  Metastatic disease evaluation. History of GIST tumor with peritoneal carcinomatosis. * Tracking Code: BO * EXAM: CT CHEST, ABDOMEN, AND PELVIS WITH CONTRAST TECHNIQUE: Multidetector CT imaging of the chest, abdomen and pelvis was performed following the standard protocol during bolus administration of intravenous contrast. RADIATION DOSE REDUCTION: This exam was performed according to the departmental dose-optimization program which includes automated exposure control, adjustment of the mA and/or kV according to patient size and/or use of iterative reconstruction technique. CONTRAST:  85m OMNIPAQUE IOHEXOL 300 MG/ML  SOLN COMPARISON:  CT AP 11/05/2021 FINDINGS: CT CHEST FINDINGS Cardiovascular: Mild cardiac enlargement. No pericardial effusion. The aortic and coronary artery atherosclerotic calcifications. Mediastinum/Nodes: There is and enlarged and heterogeneous thyroid gland. In the setting of significant comorbidities or limited life expectancy, no follow-up recommended (ref: J Am Coll Radiol. 2015 Feb;12(2): 143-50). No enlarged supraclavicular or axillary lymph nodes. Prominent right paratracheal lymph node measures 1.3 cm, image 27/2. Subcarinal lymph node is enlarged measuring 1.6 cm, image 34/2. No hilar adenopathy. Lungs/Pleura: There are small bilateral pleural effusions, left greater than right. Compressive type atelectasis is identified overlying both pleural effusions. No suspicious lung nodules. Musculoskeletal: No chest  wall mass or suspicious bone lesions identified. CT ABDOMEN PELVIS FINDINGS Hepatobiliary: No suspicious liver lesions. Stones identified within the gallbladder which measure up to 1.2 cm. Stone within the cystic duct is noted measuring 1.1 cm. No gallbladder wall thickening or bile duct dilatation. Pancreas: Unremarkable. No pancreatic ductal dilatation or surrounding inflammatory changes. Spleen: Normal in size without focal abnormality. Adrenals/Urinary Tract: Normal adrenal glands. No nephrolithiasis, hydronephrosis or suspicious mass. Bladder is unremarkable. Stomach/Bowel: Stomach appears normal. Postoperative changes from small  bowel resection with enteroenteric anastomosis. There is a large right inguinal hernia containing nonobstructed loops of small bowel. No pathologic dilatation of the large or small bowel loops to suggest obstruction. Sigmoid diverticulosis identified without signs of acute diverticulitis. Vascular/Lymphatic: Aortic atherosclerosis. No aneurysm. No pathologically enlarged abdominopelvic lymph nodes. Reproductive: Prostate is unremarkable. Other: There is a small volume of abdominopelvic ascites which is new compared with the previous exam. Haziness identified within the peritoneal and mesenteric fat suggesting anasarca. No peritoneal nodule or mass identified. No signs of pneumoperitoneum. Musculoskeletal: No acute or significant osseous findings. IMPRESSION: 1. Small bilateral pleural effusions, left greater than right. 2. Small volume of abdominopelvic ascites is new compared with the previous exam. Haziness identified within the peritoneal and mesenteric fat suggesting anasarca. No peritoneal nodule or mass identified. 3. Gallstones. Stone within the cystic duct is noted measuring 1.1 cm. No gallbladder wall thickening or bile duct dilatation. 4. Large right inguinal hernia containing nonobstructed loops of small bowel. 5. Borderline enlarged mediastinal lymph nodes are identified.  These are nonspecific and may be reactive in etiology. Metastatic disease is not excluded. 6. No signs of solid organ metastasis or nodal metastasis within the abdomen or pelvis. 7. Enlarged and heterogeneous thyroid gland. In the setting of significant comorbidities or limited life expectancy, no follow-up recommended (ref: J Am Coll Radiol. 2015 Feb;12(2): 143-50). 8.  Aortic Atherosclerosis (ICD10-I70.0). Electronically Signed   By: Kerby Moors M.D.   On: 11/22/2022 11:12   DG Chest Port 1 View  Result Date: 11/22/2022 CLINICAL DATA:  Leg swelling EXAM: PORTABLE CHEST 1 VIEW COMPARISON:  Chest x-ray dated May 05, 2022 FINDINGS: Patient is rotated to the right cardiac and mediastinal contours are unchanged. Moderate left and small right pleural effusions, increased when compared with the prior exam. Bibasilar atelectasis. No evidence pneumothorax. IMPRESSION: Moderate left and small right pleural effusions, increased when compared with the prior exam. Electronically Signed   By: Yetta Glassman M.D.   On: 11/22/2022 08:44    Procedures Procedures    Medications Ordered in ED Medications  iohexol (OMNIPAQUE) 300 MG/ML solution 100 mL (75 mLs Intravenous Contrast Given 11/22/22 1039)  furosemide (LASIX) injection 40 mg (40 mg Intravenous Given 11/22/22 1215)    ED Course/ Medical Decision Making/ A&P                           Medical Decision Making Amount and/or Complexity of Data Reviewed Labs: ordered. Radiology: ordered.  Risk Prescription drug management.   Patient remained clinically stable here oxygen sats 99% blood pressure 170/69 temp 98 respiratory rate 15 pulse 71.  Patient's initial troponin was elevated at 38 but delta was 36 and no significant abnormality there.  BNP 510 but actually lower than where he has been in the past.  CBC with no leukocytosis white count 3.7 hemoglobin reasonable at 10.8 platelets are 932 complete metabolic panel creatinine 1.894 renal function  GFR 33 again not too far off baseline for him.  No significant electrolyte abnormalities.  Total bili was 0.5 anion gap normal lipase up a little bit at 58.  Patient had CT scan of the abdomen done which led to an ultrasound.  And patient had chest x-ray done.  Chest x-ray raise some concerns for moderate pleural effusion so added on CT chest with a CT abdomen and pelvis.  CT chest significant for small bilateral pleural effusions left greater than right probably not enough to make him  symptomatic.  Small volume of abdominal peritoneal ascites is new compared with previous exam haziness identified within the peritoneal and mesenteric fat suggesting anasarca no peritoneal nodules or masses identified.  The gallbladder had stones within the cystic duct.  No gallbladder wall thickening or bile duct dilatation.  There was a large right inguinal hernia containing nonobstructed loops of small bowel.  And that was seen on exam.  Borderline enlarged mediastinal lymph nodes are identified as these are nonspecific.  But metastatic disease not excluded.  No signs of solid organ metastasis or nodule metastasis within the abdomen or pelvis.  Based on this did ultrasound of the gallbladder.  And that was consistent with gallstones and mild gallbladder wall thickening negative sonographic Murphy sign.  On retrospect they feel that the CT scan also showed multiple common bile duct stones.  The patient's bilirubin is is very normal.  Alk phos is normal as well 2.  Patient states that they have known about the a common bile duct stone since back in 2022 and they opted not to do anything about it.  Certainly he seems to be asymptomatic from this and there is no signs of acute cholecystitis.  Due to the pleural effusion and the leg swelling patient is already on 20 mg of Lasix and several days 2 weeks ago his primary had him increase that for a few days.  But he is back on just the 20 mg.  Patient given 40 mg IV here.  We will  have him take 20 mg twice a day for the next 2 to 3 days and then follow back up with his primary care doctor.  Also given referral to gastroenterology for reconsultation reconsideration about the common bile duct stones.  Final Clinical Impression(s) / ED Diagnoses Final diagnoses:  Calculus of gallbladder and bile duct without cholecystitis or obstruction  Leg swelling  Pleural effusion, bilateral    Rx / DC Orders ED Discharge Orders     None         Fredia Sorrow, MD 11/22/22 1332

## 2022-11-22 NOTE — Discharge Instructions (Addendum)
Extensive work-up here today shows small bilateral pleural effusions and some edema to the lower extremity.  No evidence of any significant pulmonary edema.  Recommend doubling up on your Lasix 20 mg, now take twice a day, for the next 2 days.  Make an appointment follow-up with your primary care doctor for follow-up of the edema and the pleural effusions.  CT scan ultrasound consistent with gallstones in the gallbladder but no evidence of acute cholecystitis.  However there is evidence of chronic gallstones in the common bile duct but you do not have obstructive bilirubin picture.  Make an appointment to follow-up with gastroenterology Dr. Kathline Magic office above.  Return for any new or worse symptoms.

## 2022-11-22 NOTE — ED Triage Notes (Signed)
Pt arrives to ED with c/o bilateral lower leg swelling and pain x3 weeks.

## 2022-11-23 NOTE — Telephone Encounter (Signed)
Oral Oncology Patient Advocate Encounter   Received notification re-enrollment for assistance for Gleevec through [employer] has been approved. Patient may continue to receive their medication at $0 from this program.    [employer]' phone number [phone].   Effective dates: 01.01.24 through 12.31.24  I have spoken to the patient.  Berdine Addison, [facility] Patient [place]  [phone] (phone) [phone] (fax) 11/23/2022 9:52 AM

## 2022-12-05 DIAGNOSIS — I5032 Chronic diastolic (congestive) heart failure: Secondary | ICD-10-CM | POA: Diagnosis not present

## 2022-12-05 DIAGNOSIS — R630 Anorexia: Secondary | ICD-10-CM | POA: Diagnosis not present

## 2022-12-09 ENCOUNTER — Other Ambulatory Visit: Payer: PRIVATE HEALTH INSURANCE

## 2022-12-09 ENCOUNTER — Inpatient Hospital Stay: Payer: Medicare Other

## 2022-12-09 ENCOUNTER — Inpatient Hospital Stay: Payer: Medicare Other | Attending: Nurse Practitioner | Admitting: Oncology

## 2022-12-09 VITALS — BP 143/58 | HR 66 | Temp 98.1°F | Resp 18 | Ht 67.0 in | Wt 165.0 lb

## 2022-12-09 DIAGNOSIS — C786 Secondary malignant neoplasm of retroperitoneum and peritoneum: Secondary | ICD-10-CM | POA: Insufficient documentation

## 2022-12-09 DIAGNOSIS — C49A3 Gastrointestinal stromal tumor of small intestine: Secondary | ICD-10-CM

## 2022-12-09 DIAGNOSIS — I252 Old myocardial infarction: Secondary | ICD-10-CM | POA: Insufficient documentation

## 2022-12-09 DIAGNOSIS — C49A9 Gastrointestinal stromal tumor of other sites: Secondary | ICD-10-CM | POA: Diagnosis not present

## 2022-12-09 DIAGNOSIS — E538 Deficiency of other specified B group vitamins: Secondary | ICD-10-CM | POA: Diagnosis not present

## 2022-12-09 DIAGNOSIS — D638 Anemia in other chronic diseases classified elsewhere: Secondary | ICD-10-CM | POA: Insufficient documentation

## 2022-12-09 DIAGNOSIS — J90 Pleural effusion, not elsewhere classified: Secondary | ICD-10-CM | POA: Insufficient documentation

## 2022-12-09 DIAGNOSIS — N289 Disorder of kidney and ureter, unspecified: Secondary | ICD-10-CM | POA: Insufficient documentation

## 2022-12-09 DIAGNOSIS — D649 Anemia, unspecified: Secondary | ICD-10-CM

## 2022-12-09 LAB — CBC WITH DIFFERENTIAL (CANCER CENTER ONLY)
Abs Immature Granulocytes: 0.01 10*3/uL (ref 0.00–0.07)
Basophils Absolute: 0 10*3/uL (ref 0.0–0.1)
Basophils Relative: 0 %
Eosinophils Absolute: 0.1 10*3/uL (ref 0.0–0.5)
Eosinophils Relative: 2 %
HCT: 30.4 % — ABNORMAL LOW (ref 39.0–52.0)
Hemoglobin: 10.2 g/dL — ABNORMAL LOW (ref 13.0–17.0)
Immature Granulocytes: 0 %
Lymphocytes Relative: 23 %
Lymphs Abs: 1.1 10*3/uL (ref 0.7–4.0)
MCH: 36.2 pg — ABNORMAL HIGH (ref 26.0–34.0)
MCHC: 33.6 g/dL (ref 30.0–36.0)
MCV: 107.8 fL — ABNORMAL HIGH (ref 80.0–100.0)
Monocytes Absolute: 0.6 10*3/uL (ref 0.1–1.0)
Monocytes Relative: 12 %
Neutro Abs: 3 10*3/uL (ref 1.7–7.7)
Neutrophils Relative %: 63 %
Platelet Count: 180 10*3/uL (ref 150–400)
RBC: 2.82 MIL/uL — ABNORMAL LOW (ref 4.22–5.81)
RDW: 13.2 % (ref 11.5–15.5)
WBC Count: 4.8 10*3/uL (ref 4.0–10.5)
nRBC: 0 % (ref 0.0–0.2)

## 2022-12-09 NOTE — Progress Notes (Signed)
[place] OFFICE PROGRESS NOTE   Diagnosis: Gastrointestinal stromal tumor  INTERVAL HISTORY:   Mr. Gilkison returns as scheduled.  He was seen in the emergency room 11/22/2022 with increased leg swelling.  A CT revealed bilateral pleural effusions, ascites, and gallstones.  The Lasix dose was increased for 3 days.  Leg edema has improved.  He continues [place].  No other complaint. The hemoglobin improved after he received Aranesp on 09/29/2022 and 10/13/2022.  He noted improvement in his energy level after Aminess.  Objective:  Vital signs in last 24 hours:  Blood pressure (!) 143/58, pulse 66, temperature 98.1 F (36.7 C), temperature source Oral, resp. rate 18, height _0  (1.702 m), weight 165 lb (74.8 kg), SpO2 98 %.    Resp: Decreased breath sounds at the lower posterior chest bilaterally, no respiratory distress Cardio: Regular rate and rhythm GI: No hepatosplenomegaly, right inguinal hernia Vascular: Trace-1+ edema throughout the right greater than left leg   Lab Results:  Lab Results  Component Value Date   WBC 4.8 12/09/2022   HGB 10.2 (L) 12/09/2022   HCT 30.4 (L) 12/09/2022   MCV 107.8 (H) 12/09/2022   PLT 180 12/09/2022   NEUTROABS 3.0 12/09/2022    CMP  Lab Results  Component Value Date   NA 145 11/22/2022   K 3.7 11/22/2022   CL 109 11/22/2022   CO2 26 11/22/2022   GLUCOSE 109 (H) 11/22/2022   BUN 21 11/22/2022   CREATININE 1.89 (H) 11/22/2022   CALCIUM 8.5 (L) 11/22/2022   PROT 6.3 (L) 11/22/2022   ALBUMIN 4.0 11/22/2022   AST 25 11/22/2022   ALT 12 11/22/2022   ALKPHOS 41 11/22/2022   BILITOT 0.5 11/22/2022   GFRNONAA 33 (L) 11/22/2022   GFRAA 51 (L) 08/05/2020    No results found for: "CEA1", "CEA", "CAN199", "CA125"  Lab Results  Component Value Date   INR 1.17 07/26/2015   LABPROT 15.1 07/26/2015    Imaging:  No results found.  Medications: I have reviewed the patient's current  medications.   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 CT 10/16/2014 consistent with extensive carcinomatosis, CT-guided biopsy of an omental mass 10/21/2014 confirmed a gastrointestinal stromal tumor Initiation of Gleevec 10/31/2014 CT 01/13/2015 revealed improvement in the peritoneal and omental metastatic disease CT 05/05/2015 with no progression of omental/peritoneal metastatic disease, increased ascites, and appendix inflammation CT 07/07/2016-no abscess, mild perihepatic and left pericolic ascites and mesenteric edema CT of the pelvis 09/20/2016, no residual abscess, no ascites CT abdomen pelvis renal stones study 04/03/2017-partial small bowel obstruction, potentially related to right inguinal hernia CT abdomen/pelvis 08/18/2017-possible cholecystitis, 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  Cholelithiasis. CT renal stone study 06/04/2021-large right and small left inguinal hernias, no ascites CTs 11/22/2022-small bilateral pleural effusions small volume ascites, gallstones, large right inguinal hernia, borderline enlarged mediastinal nodes     2. History of anemia, status post a nondiagnostic bone marrow biopsy 10/21/2014   3. NSTEMI March 2014   4. Admission 05/06/2015 with acute onset right abdomen pain-potentially related to acute appendicitis versus pain from carcinomatosis CT 05/13/2015 consistent with a right lower abdomen abscess, status post catheter drainage by interventional radiology 05/13/2015 Follow-up  CT 05/19/2015 showed resolution of the abscess. Follow-up  evaluation in interventional radiology 06/01/2015 showed the abscess cavity had resolved. The abscess drainage catheter communicated with the cecum via the appendix. The catheter was left in place. CT abdomen/pelvis 06/08/2015 showed the right pelvic drain in place without recurrent or residual surrounding fluid collection. Peritoneal metastasis with slight increase in small to moderate volume of abdominal pelvic ascites. Drainage catheter injection 06/12/2015 showed residual tiny fistulous connection with the end of the decompressed abscess cavity within the right lower abdominal quadrant and the residual lumen of the appendix. Paracentesis 06/12/2015 with 5 liters of fluid removed Paracentesis 06/19/2015-negative cytology and culture Removal of abscess drainage catheter 06/23/2015 CT 07/26/2015 with increased right abdominal wall inflammation and increased fluid collection at site of previous right abdomen abscess, ascites, and enlargement/inflammation of the appendix Placement of right lower quadrant percutaneous drainage catheter 07/27/2015. Contrast injection confirmed persistent fistulous connection with the ill-defined fluid collection and the residual appendix and cecum. Paracentesis with 2 L of fluid removed. Follow-up CT abdomen/pelvis 08/05/2015 with small to moderate left pleural effusion and moderate to large volume ascites with both appearing slightly decreased since the prior study. Right lower quadrant drainage catheter in place. No residual fluid collection around the drain or in the adjacent abdomen or pelvis. 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 Drainage catheter left in place. Tube evaluation 02/10/2016, persistent fistula Tube evaluation 03/01/2016, continued fistula to the cecum Tube downsized 05/04/2016, no residual abscess seen Collapsed abscess with a stable fistula to the cecum on imaging 06/16/2016 Tube removed  07/26/2016 Recurrent drainage from the right lower quadrant tube site August 2018, status post antibiotics with improvement, drainage again November 2018-antibiotic prescribed by Dr. [name]     5. C. difficile colitis 05/10/2015, [place] Cranker 2019   6.  Right leg edema 12/18/2015. Negative venous Doppler.   7.  Renal insufficiency   8.  Urinary retention June 2022-temporary Foley catheter placed, started on Flomax   9.  B12 deficiency-oral B12 initiated 12/15/2021   10.  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 Italiano appears stable.  There is no clinical evidence for progression of the gastrointestinal stromal tumor.  He was noted to have pleural effusions and ascites on recent CTs.  The fluid is likely related to volume overload.  He may have edema secondary to Gleevec, amlodipine, renal insufficiency, and Megace therapy.  I recommended he discontinue Megace.  He will call for dyspnea.  We will check a chest x-ray when he returns in 3 weeks to follow-up on the pleural effusions. The anemia responded to Aranesp.  We will check a CBC when he returns in 3 weeks and consider resuming Aranesp at a reduced dose.    Betsy Coder, MD  12/09/2022  11:18 AM

## 2022-12-14 ENCOUNTER — Ambulatory Visit (HOSPITAL_BASED_OUTPATIENT_CLINIC_OR_DEPARTMENT_OTHER)
Admission: RE | Admit: 2022-12-14 | Discharge: 2022-12-14 | Disposition: A | Discharge status: Home / Self Care | Payer: Medicare Other | Source: Ambulatory Visit | Attending: Oncology | Admitting: Oncology

## 2022-12-14 ENCOUNTER — Emergency Department (HOSPITAL_BASED_OUTPATIENT_CLINIC_OR_DEPARTMENT_OTHER)
Admission: EM | Admit: 2022-12-14 | Discharge: 2022-12-15 | Disposition: A | Discharge status: Home / Self Care | Payer: Medicare Other | Attending: Emergency Medicine | Admitting: Emergency Medicine

## 2022-12-14 ENCOUNTER — Other Ambulatory Visit: Payer: Self-pay

## 2022-12-14 ENCOUNTER — Encounter (HOSPITAL_BASED_OUTPATIENT_CLINIC_OR_DEPARTMENT_OTHER): Payer: Self-pay

## 2022-12-14 DIAGNOSIS — R600 Localized edema: Secondary | ICD-10-CM | POA: Diagnosis not present

## 2022-12-14 DIAGNOSIS — C49A3 Gastrointestinal stromal tumor of small intestine: Secondary | ICD-10-CM | POA: Insufficient documentation

## 2022-12-14 DIAGNOSIS — Z79899 Other long term (current) drug therapy: Secondary | ICD-10-CM | POA: Insufficient documentation

## 2022-12-14 DIAGNOSIS — I509 Heart failure, unspecified: Secondary | ICD-10-CM

## 2022-12-14 DIAGNOSIS — R609 Edema, unspecified: Secondary | ICD-10-CM

## 2022-12-14 DIAGNOSIS — I5033 Acute on chronic diastolic (congestive) heart failure: Secondary | ICD-10-CM | POA: Insufficient documentation

## 2022-12-14 DIAGNOSIS — Z1152 Encounter for screening for COVID-19: Secondary | ICD-10-CM | POA: Diagnosis not present

## 2022-12-14 DIAGNOSIS — I214 Non-ST elevation (NSTEMI) myocardial infarction: Secondary | ICD-10-CM | POA: Diagnosis not present

## 2022-12-14 DIAGNOSIS — I130 Hypertensive heart and chronic kidney disease with heart failure and stage 1 through stage 4 chronic kidney disease, or unspecified chronic kidney disease: Secondary | ICD-10-CM | POA: Insufficient documentation

## 2022-12-14 DIAGNOSIS — I2510 Atherosclerotic heart disease of native coronary artery without angina pectoris: Secondary | ICD-10-CM | POA: Diagnosis not present

## 2022-12-14 DIAGNOSIS — N183 Chronic kidney disease, stage 3 unspecified: Secondary | ICD-10-CM | POA: Diagnosis not present

## 2022-12-14 DIAGNOSIS — I110 Hypertensive heart disease with heart failure: Secondary | ICD-10-CM | POA: Diagnosis not present

## 2022-12-14 DIAGNOSIS — E782 Mixed hyperlipidemia: Secondary | ICD-10-CM | POA: Diagnosis not present

## 2022-12-14 DIAGNOSIS — Z20822 Contact with and (suspected) exposure to covid-19: Secondary | ICD-10-CM | POA: Insufficient documentation

## 2022-12-14 DIAGNOSIS — J90 Pleural effusion, not elsewhere classified: Secondary | ICD-10-CM | POA: Diagnosis not present

## 2022-12-14 DIAGNOSIS — R0602 Shortness of breath: Secondary | ICD-10-CM | POA: Diagnosis present

## 2022-12-14 LAB — CBC
HCT: 30.1 % — ABNORMAL LOW (ref 39.0–52.0)
Hemoglobin: 9.9 g/dL — ABNORMAL LOW (ref 13.0–17.0)
MCH: 35.6 pg — ABNORMAL HIGH (ref 26.0–34.0)
MCHC: 32.9 g/dL (ref 30.0–36.0)
MCV: 108.3 fL — ABNORMAL HIGH (ref 80.0–100.0)
Platelets: 177 10*3/uL (ref 150–400)
RBC: 2.78 MIL/uL — ABNORMAL LOW (ref 4.22–5.81)
RDW: 13.2 % (ref 11.5–15.5)
WBC: 4.7 10*3/uL (ref 4.0–10.5)
nRBC: 0 % (ref 0.0–0.2)

## 2022-12-14 LAB — RESP PANEL BY RT-PCR (RSV, FLU A&B, COVID)  RVPGX2
Influenza A by PCR: NEGATIVE
Influenza B by PCR: NEGATIVE
Resp Syncytial Virus by PCR: NEGATIVE
SARS Coronavirus 2 by RT PCR: NEGATIVE

## 2022-12-14 LAB — BRAIN NATRIURETIC PEPTIDE: B Natriuretic Peptide: 800.4 pg/mL — ABNORMAL HIGH (ref 0.0–100.0)

## 2022-12-14 LAB — BASIC METABOLIC PANEL
Anion gap: 10 (ref 5–15)
BUN: 20 mg/dL (ref 8–23)
CO2: 22 mmol/L (ref 22–32)
Calcium: 8.7 mg/dL — ABNORMAL LOW (ref 8.9–10.3)
Chloride: 111 mmol/L (ref 98–111)
Creatinine, Ser: 1.75 mg/dL — ABNORMAL HIGH (ref 0.61–1.24)
GFR, Estimated: 36 mL/min — ABNORMAL LOW (ref >60)
Glucose, Bld: 175 mg/dL — ABNORMAL HIGH (ref 70–99)
Potassium: 3.4 mmol/L — ABNORMAL LOW (ref 3.5–5.1)
Sodium: 143 mmol/L (ref 135–145)

## 2022-12-14 MED ORDER — FUROSEMIDE 10 MG/ML IJ SOLN
40.0000 mg | Freq: Once | INTRAMUSCULAR | Status: AC
  Administered 2022-12-14: 40 mg via INTRAVENOUS
  Filled 2022-12-14: qty 4

## 2022-12-14 NOTE — ED Triage Notes (Signed)
Pt. Presents from home with family, aaxo4, ambulatory, cc of SOB, states CXR was shot this AM ordered from oncologist upstairs. Patient states he was here 3 weeks ago for same complaint with fluid on his lungs and fluid overload. States when sitting no complaints but upon exertion patient becomes SOB. Patient on lasix at home and was instructed to increase lasix dose at home.

## 2022-12-15 ENCOUNTER — Encounter: Payer: Self-pay | Admitting: Cardiovascular Disease

## 2022-12-15 ENCOUNTER — Ambulatory Visit (INDEPENDENT_AMBULATORY_CARE_PROVIDER_SITE_OTHER): Payer: Medicare Other | Admitting: Cardiovascular Disease

## 2022-12-15 ENCOUNTER — Telehealth: Payer: Self-pay

## 2022-12-15 VITALS — BP 128/56 | HR 86 | Ht 67.0 in | Wt 163.4 lb

## 2022-12-15 DIAGNOSIS — Z79899 Other long term (current) drug therapy: Secondary | ICD-10-CM | POA: Insufficient documentation

## 2022-12-15 DIAGNOSIS — I5033 Acute on chronic diastolic (congestive) heart failure: Secondary | ICD-10-CM

## 2022-12-15 DIAGNOSIS — E782 Mixed hyperlipidemia: Secondary | ICD-10-CM | POA: Diagnosis not present

## 2022-12-15 DIAGNOSIS — I5031 Acute diastolic (congestive) heart failure: Secondary | ICD-10-CM

## 2022-12-15 DIAGNOSIS — I214 Non-ST elevation (NSTEMI) myocardial infarction: Secondary | ICD-10-CM | POA: Insufficient documentation

## 2022-12-15 DIAGNOSIS — J90 Pleural effusion, not elsewhere classified: Secondary | ICD-10-CM

## 2022-12-15 DIAGNOSIS — I509 Heart failure, unspecified: Secondary | ICD-10-CM | POA: Diagnosis not present

## 2022-12-15 MED ORDER — FUROSEMIDE 40 MG PO TABS: 40.0000 mg | ORAL_TABLET | Freq: Every day | ORAL | 3 refills | Status: DC

## 2022-12-15 MED ORDER — POTASSIUM CHLORIDE CRYS ER 20 MEQ PO TBCR
40.0000 meq | EXTENDED_RELEASE_TABLET | Freq: Once | ORAL | Status: AC
  Administered 2022-12-15: 40 meq via ORAL
  Filled 2022-12-15: qty 2

## 2022-12-15 MED ORDER — POTASSIUM CHLORIDE ER 10 MEQ PO TBCR: 10.0000 meq | EXTENDED_RELEASE_TABLET | Freq: Every day | ORAL | 0 refills | Status: DC

## 2022-12-15 NOTE — Assessment & Plan Note (Signed)
History of essential hypertension blood pressure measured today at 128/56.  He is on amlodipine and metoprolol.

## 2022-12-15 NOTE — Assessment & Plan Note (Signed)
History of hyperlipidemia on statin therapy with lipid profile performed 02/21/2022 revealing total cholesterol 133, LDL 81 and HDL 34.

## 2022-12-15 NOTE — Telephone Encounter (Signed)
-----   Message from Ladell Pier, MD sent at 12/14/2022  8:20 PM EST ----- Please call patient, cxr shows bilateral pleural effusions, new on rt., call for dyspnea, schedule office visit and cxr next 2 weeks, be sure he has stopped megace

## 2022-12-15 NOTE — Assessment & Plan Note (Signed)
History of diastolic heart failure with echo performed 09/27/2020 revealing normal LV systolic function without evidence of valvular heart disease.  He was seen earlier this month with volume overload and again last night.  His BNP was 800.  He was given 40 mg of IV Lasix and diuresed 1 L.  He feels clinically improved.  He did have bilateral pleural effusions.  He has moderate renal insufficiency.  He does not admit to dietary indiscretion with regards to salt.  I am going to repeat a 2D echo and increase his furosemide to 40 mg a day.  Will check a basic metabolic panel in 2 weeks.  He will see an APP back in 6 weeks and me back in 3 months.

## 2022-12-15 NOTE — Assessment & Plan Note (Signed)
History of non-STEMI in the past with catheterization revealing known LAD occlusion with left to left collaterals to D1.  He also had a right to left collaterals from septal perforator to distal LAD.  Medical therapy was recommended.  He denies chest pain.

## 2022-12-15 NOTE — Telephone Encounter (Signed)
Patient and son notified of test results. Verbalizes understanding and agrees to the plan. Will call for further concerns.

## 2022-12-15 NOTE — Progress Notes (Signed)
12/15/2022 [name]   07/02/1929  [id number]  Primary Physician Orpah Melter, MD Primary Cardiologist: Lorretta Harp MD Lupe Carney, [place]  HPI:  [name] [name] is a 88 y.o.  mildly overweight married Caucasian male father of 2 children, grandfather 4 grandchildren is accompanied by his son [name] [name] today.  He was referred by the emergency room who we saw last night because of heart failure.  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  He lives independently with his wife.  He does have a history of treated hypertension and hyperlipidemia.  He is never had a stroke.  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 Blair Promise on chemotherapy.  He is known CAD status post remote cath revealing an occluded LAD with bidirectional collaterals medically treated.  His last echo performed 09/27/2020 revealed normal LV systolic function without valvular abnormalities.  He was seen in the ER last night with volume overload, shortness of breath and dyspnea.  His BNP was 800.  Chest x-ray showed bilateral pleural effusions.  His serum creatinine was in the high 1 range.  He was given IV Lasix 40 mg and diuresed 1 L.  He feels clinically improved.   Current Meds  Medication Sig   amLODipine (NORVASC) 10 MG tablet Take 10 mg by mouth daily.   cyanocobalamin 1000 MCG tablet Take 1,000 mcg by mouth daily.   ferrous sulfate 325 (65 FE) MG EC tablet Take 1 tablet (325 mg total) by mouth 2 (two) times daily with a meal.   imatinib (GLEEVEC) 400 MG tablet Take 1 tablet (400 mg total) by mouth daily. Take with meals and large glass of water.Caution:Chemotherapy.   metoprolol tartrate (LOPRESSOR) 25 MG tablet Take 1 tablet (25 mg total) by mouth 2 (two) times daily.   potassium chloride (KLOR-CON) 10 MEQ tablet Take 1 tablet (10 mEq total) by mouth daily.   simvastatin (ZOCOR) 20 MG tablet Take 20 mg by mouth daily.     No Known Allergies  Social History   Socioeconomic History    Marital status: Married    Spouse name: [name]   Number of children: 2   Years of education: Not on file   Highest education level: Not on file  Occupational History   Occupation: Retired    Comment: Writer  Tobacco Use   Smoking status: Never   Smokeless tobacco: Never  Vaping Use   Vaping Use: Never used  Substance and Sexual Activity   Alcohol use: No   Drug use: No   Sexual activity: Not Currently  Other Topics Concern   Not on file  Social History Narrative   Married, wife [name]   Retired Environmental manager for 60 years   Independent in [place]   #2 grown sons   Social Determinants of Radio broadcast assistant Strain: Not on file  Food Insecurity: Not on file  Transportation Needs: Not on file  Physical Activity: Not on file  Stress: Not on file  Social Connections: Not on file  Intimate Partner Violence: Not on file     Review of Systems: General: negative for chills, fever, night sweats or weight changes.  Cardiovascular: negative for chest pain, dyspnea on exertion, edema, orthopnea, palpitations, paroxysmal nocturnal dyspnea or shortness of breath Dermatological: negative for rash Respiratory: negative for cough or wheezing Urologic: negative for hematuria Abdominal: negative for nausea, vomiting, diarrhea, bright red blood per rectum, melena,  or hematemesis Neurologic: negative for visual changes, syncope, or dizziness All other systems reviewed and are otherwise negative except as noted above.    Blood pressure (!) 128/56, pulse 86, height '5\' 7"'$  (1.702 m), weight 163 lb 6.4 oz (74.1 kg), SpO2 99 %.  General appearance: alert and no distress Neck: no adenopathy, no carotid bruit, no JVD, supple, symmetrical, trachea midline, and thyroid not enlarged, symmetric, no tenderness/mass/nodules Lungs: clear to auscultation bilaterally Heart: regular rate and rhythm, S1, S2 normal, no murmur, click, rub or gallop Extremities: 1-2+ lower  extremity edema. Pulses: 2+ and symmetric Skin: Skin color, texture, turgor normal. No rashes or lesions Neurologic: Grossly normal  EKG not performed today  ASSESSMENT AND PLAN:   Hypertension History of essential hypertension blood pressure measured today at 128/56.  He is on amlodipine and metoprolol.  Diastolic CHF, acute on chronic (HCC) History of diastolic heart failure with echo performed 09/27/2020 revealing normal LV systolic function without evidence of valvular heart disease.  He was seen earlier this month with volume overload and again last night.  His BNP was 800.  He was given 40 mg of IV Lasix and diuresed 1 L.  He feels clinically improved.  He did have bilateral pleural effusions.  He has moderate renal insufficiency.  He does not admit to dietary indiscretion with regards to salt.  I am going to repeat a 2D echo and increase his furosemide to 40 mg a day.  Will check a basic metabolic panel in 2 weeks.  He will see an APP back in 6 weeks and me back in 3 months.  NSTEMI (non-ST elevated myocardial infarction) ([place]) History of non-STEMI in the past with catheterization revealing known LAD occlusion with left to left collaterals to D1.  He also had a right to left collaterals from septal perforator to distal LAD.  Medical therapy was recommended.  He denies chest pain.  Hyperlipidemia History of hyperlipidemia on statin therapy with lipid profile performed 02/21/2022 revealing total cholesterol 133, LDL 81 and HDL 34.     Lorretta Harp MD [facility], [facility] 12/15/2022 3:05 PM

## 2022-12-15 NOTE — ED Provider Notes (Signed)
[facility] EMERGENCY DEPT Provider Note   CSN: [id number] Arrival date & time: 12/14/22  1919     History  Chief Complaint  Patient presents with   Shortness of Breath    [name] [name] is a 88 y.o. male.  The history is provided by the patient and a relative.  Shortness of Breath Patient history of CAD, chronic kidney disease, hypertension, previous GI stromal tumor, previous CHF presents for increasing shortness of breath.  He reports for at least 2 weeks has had dyspnea on exertion.  He also has orthopnea.  No fevers or vomiting.  No active chest pain.  No significant cough.  Has had some increased lower extremity edema. He reports taking Lasix every day.  No other acute complaints.  Denies any recent GI bleed symptoms Past Medical History:  Diagnosis Date   Abdominal mass 02/2013   Acute respiratory failure with hypoxia ([place]) 09/12/2014   CAD (coronary artery disease)    CKD (chronic kidney disease), stage III (HCC)    GIST (gastrointestinal stroma tumor), malignant, colon ([place])    [name] 03/13/2017   Hemorrhagic shock ([place]) 09/13/2014   Hyperlipidemia    TAKES zOCOR DAILY   Hypertension    TAKES LOTREL,HCTZ,AND METOPROLOL DAILY   Intraperitoneal hemorrhage 09/17/2014   NSTEMI (non-ST elevated myocardial infarction) ([place]) 02/2013   "LIGHT: ONE MD SAID HE DID AND ONE SAID HE DIDN'T   Pneumonia    MAR 2014   Shortness of breath    Stromal tumor of digestive system ([place])    UTI (urinary tract infection)     Home Medications Prior to Admission medications   Medication Sig Start Date End Date Taking? Authorizing Provider  amLODipine (NORVASC) 10 MG tablet Take 10 mg by mouth daily. 08/23/22   [provider]  cyanocobalamin 1000 MCG tablet Take 1,000 mcg by mouth daily.    [provider]  ferrous sulfate 325 (65 FE) MG EC tablet Take 1 tablet (325 mg total) by mouth 2 (two) times daily with a meal. 09/29/22   Ladell Pier, MD  furosemide  (LASIX) 20 MG tablet Take 2 tablets (40 mg total) by mouth daily for 3 days. 05/05/22 12/09/22  Deno Etienne, DO  glycerin adult 2 g suppository Place 1 suppository rectally as needed for constipation. Patient not taking: Reported on 08/02/2022 11/05/21   Regan Lemming, MD  imatinib (GLEEVEC) 400 MG tablet Take 1 tablet (400 mg total) by mouth daily. Take with meals and large glass of water.Caution:Chemotherapy. 11/14/22   Ladell Pier, MD  loperamide (IMODIUM) 2 MG capsule Take 1 capsule (2 mg total) by mouth 4 (four) times daily as needed for diarrhea or loose stools. Patient not taking: Reported on 12/30/2021 10/27/21   Tonye Pearson, PA-C  megestrol (MEGACE) 40 MG/ML suspension Take by mouth. 11/09/22   [provider]  metoprolol tartrate (LOPRESSOR) 25 MG tablet Take 1 tablet (25 mg total) by mouth 2 (two) times daily. 03/06/13   Caren Griffins, MD  Potassium Chloride (KLOR-CON 10 PO) Take 10 mEq by mouth daily.    [provider]  simvastatin (ZOCOR) 20 MG tablet Take 20 mg by mouth daily. 10/21/20   [provider]  tamsulosin (FLOMAX) 0.4 MG CAPS capsule Take 0.4 mg by mouth daily. 02/04/20   [provider]      Allergies    Patient has no known allergies.    Review of Systems   Review of Systems  Respiratory:  Positive  for shortness of breath.     Physical Exam Updated Vital Signs BP (!) 151/73   Pulse 73   Temp 98.3 F (36.8 C)   Resp (!) 21   Ht 1.702 m ('5\' 7"'$ )   Wt 74.8 kg   SpO2 99%   BMI 25.83 kg/m  Physical Exam CONSTITUTIONAL: Elderly, no acute distress HEAD: Normocephalic/atraumatic EYES: EOMI/PERRL ENMT: Mucous membranes moist NECK: supple no meningeal signs, JVD noted CV: Irregular, no loud murmurs LUNGS: Lungs are clear to auscultation bilaterally, no apparent distress ABDOMEN: soft, nontender NEURO: Pt is awake/alert/appropriate, moves all extremitiesx4.  No facial droop.   EXTREMITIES: pulses normal/equal, full  ROM, 1+ pitting edema to bilateral lower extremities SKIN: warm, color normal PSYCH: no abnormalities of mood noted, alert and oriented to situation  ED Results / Procedures / Treatments   Labs (all labs ordered are listed, but only abnormal results are displayed) Labs Reviewed  BASIC METABOLIC PANEL - Abnormal; Notable for the following components:      Result Value   Potassium 3.4 (*)    Glucose, Bld 175 (*)    Creatinine, Ser 1.75 (*)    Calcium 8.7 (*)    GFR, Estimated 36 (*)    All other components within normal limits  CBC - Abnormal; Notable for the following components:   RBC 2.78 (*)    Hemoglobin 9.9 (*)    HCT 30.1 (*)    MCV 108.3 (*)    MCH 35.6 (*)    All other components within normal limits  BRAIN NATRIURETIC PEPTIDE - Abnormal; Notable for the following components:   B Natriuretic Peptide 800.4 (*)    All other components within normal limits  RESP PANEL BY RT-PCR (RSV, FLU A&B, COVID)  [id number]    EKG  ED ECG REPORT   Date: 12/14/2022 1932  Rate: 80  Rhythm: indeterminate  QRS Axis: right  Intervals: normal  ST/T Wave abnormalities: nonspecific ST changes  Conduction Disutrbances:none     Radiology DG Chest 2 View  Result Date: 12/14/2022 CLINICAL DATA:  Follow-up pleural effusions. EXAM: CHEST - 2 VIEW COMPARISON:  November 22, 2022 FINDINGS: The heart size and mediastinal contours are stable. Moderate left pleural effusion stable. There is interval developed moderate right pleural effusion. There is no pulmonary edema. The visualized skeletal structures are stable. IMPRESSION: Moderate left pleural effusion stable. Interval developed moderate right pleural effusion. Electronically Signed   By: Abelardo Diesel M.D.   On: 12/14/2022 14:05    Procedures Procedures    Medications Ordered in ED Medications  potassium chloride SA (KLOR-CON M) CR tablet 40 mEq (has no administration in time range)  furosemide (LASIX) injection 40 mg (40 mg Intravenous  Given 12/14/22 2340)    ED Course/ Medical Decision Making/ A&P Clinical Course as of 12/15/22 0334  Thu Dec 15, 2022  0142 Patient reports feeling improved, he is having adequate diuresis [DW]  0334 Patient is had up to 1000 mL of urine output.  He is feeling improved.  Work of breathing is improved.  I suspect mild exacerbation of CHF. [DW]  1517 He feels comfortable for discharge.  He will take Lasix twice daily for 5 days.  He will then have follow-up with his PCP for lab check.  It appears he is already on potassium.  Will also give referral to cardiology. [DW]    Clinical Course User Index [DW] Ripley Fraise, MD  Medical Decision Making Amount and/or Complexity of Data Reviewed Labs: ordered.  Risk Prescription drug management.   This patient presents to the ED for concern of shortness of breath, this involves an extensive number of treatment options, and is a complaint that carries with it a high risk of complications and morbidity.  The differential diagnosis includes but is not limited to Acute coronary syndrome, pneumonia, acute pulmonary edema, pneumothorax, acute anemia, pulmonary embolism    Comorbidities that complicate the patient evaluation: Patient's presentation is complicated by their history of GIST, CAD  Social Determinants of Health: Patient's  hard of hearing   increases the complexity of managing their presentation  Additional history obtained: Additional history obtained from family Records reviewed  outpatient records reviewed  Lab Tests: I Ordered, and personally interpreted labs.  The pertinent results include: Elevated BNP consistent with CHF  Imaging Studies ordered: I ordered imaging studies including chest x-ray I independently visualized and interpreted imaging which showed pleural effusions I agree with the radiologist interpretation  Cardiac Monitoring: The patient was maintained on a cardiac monitor.  I  personally viewed and interpreted the cardiac monitor which showed an underlying rhythm of:  sinus rhythm  Medicines ordered and prescription drug management: I ordered medication including Lasix for presumed CHF/edema Reevaluation of the patient after these medicines showed that the patient    improved   Critical Interventions:  IV lasix   Reevaluation: After the interventions noted above, I reevaluated the patient and found that they have :improved  Complexity of problems addressed: Patient's presentation is most consistent with  acute presentation with potential threat to life or bodily function  Disposition: After consideration of the diagnostic results and the patient's response to treatment,  I feel that the patent would benefit from discharge   .           Final Clinical Impression(s) / ED Diagnoses Final diagnoses:  Peripheral edema  Congestive heart failure, unspecified HF chronicity, unspecified heart failure type ([place])    Rx / DC Orders ED Discharge Orders          Ordered    Ambulatory referral to Cardiology       Comments: If you have not heard from the Cardiology office within the next 72 hours please call [phone].   12/15/22 [id number]              Ripley Fraise, MD 12/15/22 [id number]

## 2022-12-15 NOTE — Patient Instructions (Addendum)
Medication Instructions:   INCREASE Lasix to 40 mg daily  *If you need a refill on your cardiac medications before your next appointment, please call your pharmacy*  Lab Work: Your physician recommends that you return for lab work in 2 weeks:  BMP  If you have labs (blood work) drawn today and your tests are completely normal, you will receive your results only by: [place] (if you have MyChart) OR A paper copy in the mail If you have any lab test that is abnormal or we need to change your treatment, we will call you to review the results.  Testing/Procedures: Your physician has requested that you have an echocardiogram. Echocardiography is a painless test that uses sound waves to create images of your heart. It provides your doctor with information about the size and shape of your heart and how well your heart's chambers and valves are working. This procedure takes approximately one hour. There are no restrictions for this procedure. Please do NOT wear cologne, perfume, aftershave, or lotions (deodorant is allowed). Please arrive 15 minutes prior to your appointment time.  Follow-Up: At [facility], you and your health needs are our priority.  As part of our continuing mission to provide you with exceptional heart care, we have created designated Provider Care Teams.  These Care Teams include your primary Cardiologist (physician) and Advanced Practice Providers (APPs -  Physician Assistants and Nurse Practitioners) who all work together to provide you with the care you need, when you need it.  Your next appointment:   4-6 week(s)  The format for your next appointment:   In Person  Provider:   APP    Then, Quay Burow, MD will plan to see you again in 3 month(s).    Other Instructions  Important Information About Sugar

## 2022-12-20 ENCOUNTER — Ambulatory Visit (HOSPITAL_BASED_OUTPATIENT_CLINIC_OR_DEPARTMENT_OTHER)
Admission: RE | Admit: 2022-12-20 | Discharge: 2022-12-20 | Disposition: A | Discharge status: Home / Self Care | Payer: Medicare Other | Source: Ambulatory Visit | Attending: Oncology | Admitting: Oncology

## 2022-12-20 DIAGNOSIS — J90 Pleural effusion, not elsewhere classified: Secondary | ICD-10-CM | POA: Insufficient documentation

## 2022-12-20 DIAGNOSIS — J9811 Atelectasis: Secondary | ICD-10-CM | POA: Diagnosis not present

## 2022-12-22 ENCOUNTER — Inpatient Hospital Stay: Payer: Medicare Other

## 2022-12-22 ENCOUNTER — Telehealth: Payer: Self-pay

## 2022-12-22 ENCOUNTER — Encounter: Payer: Self-pay | Admitting: Nurse Practitioner

## 2022-12-22 ENCOUNTER — Inpatient Hospital Stay: Payer: Medicare Other | Attending: Nurse Practitioner | Admitting: Nurse Practitioner

## 2022-12-22 VITALS — BP 142/55 | HR 75 | Temp 98.2°F | Resp 20 | Ht 67.0 in | Wt 161.8 lb

## 2022-12-22 DIAGNOSIS — E538 Deficiency of other specified B group vitamins: Secondary | ICD-10-CM | POA: Insufficient documentation

## 2022-12-22 DIAGNOSIS — C786 Secondary malignant neoplasm of retroperitoneum and peritoneum: Secondary | ICD-10-CM | POA: Diagnosis not present

## 2022-12-22 DIAGNOSIS — C49A3 Gastrointestinal stromal tumor of small intestine: Secondary | ICD-10-CM

## 2022-12-22 DIAGNOSIS — I129 Hypertensive chronic kidney disease with stage 1 through stage 4 chronic kidney disease, or unspecified chronic kidney disease: Secondary | ICD-10-CM | POA: Insufficient documentation

## 2022-12-22 DIAGNOSIS — C49A9 Gastrointestinal stromal tumor of other sites: Secondary | ICD-10-CM | POA: Insufficient documentation

## 2022-12-22 DIAGNOSIS — D631 Anemia in chronic kidney disease: Secondary | ICD-10-CM | POA: Diagnosis not present

## 2022-12-22 DIAGNOSIS — N289 Disorder of kidney and ureter, unspecified: Secondary | ICD-10-CM | POA: Diagnosis not present

## 2022-12-22 DIAGNOSIS — J90 Pleural effusion, not elsewhere classified: Secondary | ICD-10-CM

## 2022-12-22 DIAGNOSIS — I252 Old myocardial infarction: Secondary | ICD-10-CM | POA: Diagnosis not present

## 2022-12-22 DIAGNOSIS — N183 Chronic kidney disease, stage 3 unspecified: Secondary | ICD-10-CM | POA: Insufficient documentation

## 2022-12-22 DIAGNOSIS — D649 Anemia, unspecified: Secondary | ICD-10-CM

## 2022-12-22 LAB — CBC WITH DIFFERENTIAL (CANCER CENTER ONLY)
Abs Immature Granulocytes: 0.01 10*3/uL (ref 0.00–0.07)
Basophils Absolute: 0 10*3/uL (ref 0.0–0.1)
Basophils Relative: 1 %
Eosinophils Absolute: 0.1 10*3/uL (ref 0.0–0.5)
Eosinophils Relative: 2 %
HCT: 27.5 % — ABNORMAL LOW (ref 39.0–52.0)
Hemoglobin: 9.3 g/dL — ABNORMAL LOW (ref 13.0–17.0)
Immature Granulocytes: 0 %
Lymphocytes Relative: 22 %
Lymphs Abs: 1 10*3/uL (ref 0.7–4.0)
MCH: 36.3 pg — ABNORMAL HIGH (ref 26.0–34.0)
MCHC: 33.8 g/dL (ref 30.0–36.0)
MCV: 107.4 fL — ABNORMAL HIGH (ref 80.0–100.0)
Monocytes Absolute: 0.4 10*3/uL (ref 0.1–1.0)
Monocytes Relative: 10 %
Neutro Abs: 2.9 10*3/uL (ref 1.7–7.7)
Neutrophils Relative %: 65 %
Platelet Count: 173 10*3/uL (ref 150–400)
RBC: 2.56 MIL/uL — ABNORMAL LOW (ref 4.22–5.81)
RDW: 12.9 % (ref 11.5–15.5)
WBC Count: 4.4 10*3/uL (ref 4.0–10.5)
nRBC: 0 % (ref 0.0–0.2)

## 2022-12-22 LAB — MAGNESIUM: Magnesium: 1.9 mg/dL (ref 1.7–2.4)

## 2022-12-22 LAB — BASIC METABOLIC PANEL - CANCER CENTER ONLY
Anion gap: 14 (ref 5–15)
BUN: 22 mg/dL (ref 8–23)
CO2: 23 mmol/L (ref 22–32)
Calcium: 8.6 mg/dL — ABNORMAL LOW (ref 8.9–10.3)
Chloride: 108 mmol/L (ref 98–111)
Creatinine: 2.02 mg/dL — ABNORMAL HIGH (ref 0.61–1.24)
GFR, Estimated: 31 mL/min — ABNORMAL LOW (ref >60)
Glucose, Bld: 97 mg/dL (ref 70–99)
Potassium: 4.4 mmol/L (ref 3.5–5.1)
Sodium: 145 mmol/L (ref 135–145)

## 2022-12-22 MED ORDER — DARBEPOETIN ALFA 100 MCG/0.5ML IJ SOSY
100.0000 ug | PREFILLED_SYRINGE | Freq: Once | INTRAMUSCULAR | Status: AC
  Administered 2022-12-22: 100 ug via SUBCUTANEOUS
  Filled 2022-12-22: qty 0.5

## 2022-12-22 MED ORDER — POTASSIUM CHLORIDE CRYS ER 20 MEQ PO TBCR: 20.0000 meq | EXTENDED_RELEASE_TABLET | Freq: Every day | ORAL | 2 refills | Status: DC

## 2022-12-22 NOTE — Progress Notes (Signed)
[facility] OFFICE PROGRESS NOTE   Diagnosis: Gastrointestinal stromal tumor  INTERVAL HISTORY:   Mr. Petrich returns as scheduled.  He continues [place].  Last Aranesp 10/13/2022.  Lasix dose was increased to 40 mg a day per cardiology 12/15/2022.  He has noted no significant change in the edema.  He reports he is scheduled for an echo in a few weeks.  He has dyspnea on exertion.  No cough or fever.  Objective:  Vital signs in last 24 hours:  Blood pressure (!) 142/55, pulse 75, temperature 98.2 F (36.8 C), temperature source Oral, resp. rate 20, height _0  (1.702 m), weight 161 lb 12.8 oz (73.4 kg), SpO2 98 %.    HEENT: Periorbital edema. Resp: Breath sounds diminished at the bases.  No respiratory distress. Cardio: Regular rate and rhythm. GI: No hepatosplenomegaly.  No mass. Vascular: Pitting edema lower leg bilaterally.   Lab Results:  Lab Results  Component Value Date   WBC 4.4 12/22/2022   HGB 9.3 (L) 12/22/2022   HCT 27.5 (L) 12/22/2022   MCV 107.4 (H) 12/22/2022   PLT 173 12/22/2022   NEUTROABS 2.9 12/22/2022    Imaging:  DG Chest 2 View  Result Date: 12/21/2022 CLINICAL DATA:  Pleural effusion follow up. EXAM: CHEST - 2 VIEW COMPARISON:  Chest x-ray 12/14/2022.  CT 11/22/2022. FINDINGS: Mediastinum and hilar structures appear stable. Reference is made to prior chest CT report for discussion of mediastinal adenopathy. Heart size stable. No pulmonary venous congestion. Low lung volumes with bibasilar atelectasis. Left base infiltrate suggesting pneumonia noted. Bilateral pleural effusions again noted. The left pleural effusion has improved slightly. No pneumothorax. Degenerative changes and scoliosis thoracic spine. No acute bony abnormalities identified. IMPRESSION: 1. Low lung volumes with bibasilar atelectasis. Left base infiltrate suggesting pneumonia noted. Continued follow-up chest x-rays to demonstrate clearing suggested. 2. Bilateral pleural  effusions again noted. The left pleural effusion has improved slightly. Electronically Signed   By: Marcello Moores  Register M.D.   On: 12/21/2022 09:13    Medications: I have reviewed the patient's current medications.  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 CT 10/16/2014 consistent with extensive carcinomatosis, CT-guided biopsy of an omental mass 10/21/2014 confirmed a gastrointestinal stromal tumor Initiation of Gleevec 10/31/2014 CT 01/13/2015 revealed improvement in the peritoneal and omental metastatic disease CT 05/05/2015 with no progression of omental/peritoneal metastatic disease, increased ascites, and appendix inflammation CT 07/07/2016-no abscess, mild perihepatic and left pericolic ascites and mesenteric edema CT of the pelvis 09/20/2016, no residual abscess, no ascites CT abdomen pelvis renal stones study 04/03/2017-partial small bowel obstruction, potentially related to right inguinal hernia CT abdomen/pelvis 08/18/2017-possible cholecystitis, 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  Cholelithiasis. CT renal stone study 06/04/2021-large right and small left inguinal hernias, no ascites CTs 11/22/2022-small bilateral pleural effusions small volume ascites, gallstones, large right inguinal hernia, borderline enlarged mediastinal nodes Gleevec 400 mg every other day beginning 12/22/2022     2. History of anemia, status post a nondiagnostic bone marrow biopsy 10/21/2014   3. NSTEMI March 2014   4. Admission 05/06/2015 with acute onset right abdomen pain-potentially related to acute  appendicitis versus pain from carcinomatosis CT 05/13/2015 consistent with a right lower  abdomen abscess, status post catheter drainage by interventional radiology 05/13/2015 Follow-up CT 05/19/2015 showed resolution of the abscess. Follow-up evaluation in interventional radiology 06/01/2015 showed the abscess cavity had resolved. The abscess drainage catheter communicated with the cecum via the appendix. The catheter was left in place. CT abdomen/pelvis 06/08/2015 showed the right pelvic drain in place without recurrent or residual surrounding fluid collection. Peritoneal metastasis with slight increase in small to moderate volume of abdominal pelvic ascites. Drainage catheter injection 06/12/2015 showed residual tiny fistulous connection with the end of the decompressed abscess cavity within the right lower abdominal quadrant and the residual lumen of the appendix. Paracentesis 06/12/2015 with 5 liters of fluid removed Paracentesis 06/19/2015-negative cytology and culture Removal of abscess drainage catheter 06/23/2015 CT 07/26/2015 with increased right abdominal wall inflammation and increased fluid collection at site of previous right abdomen abscess, ascites, and enlargement/inflammation of the appendix Placement of right lower quadrant percutaneous drainage catheter 07/27/2015. Contrast injection confirmed persistent fistulous connection with the ill-defined fluid collection and the residual appendix and cecum. Paracentesis with 2 L of fluid removed. Follow-up CT abdomen/pelvis 08/05/2015 with small to moderate left pleural effusion and moderate to large volume ascites with both appearing slightly decreased since the prior study. Right lower quadrant drainage catheter in place. No residual fluid collection around the drain or in the adjacent abdomen or pelvis. 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 Drainage catheter left in place. 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 [name]     5. C. difficile colitis 05/10/2015, Lamount Cranker 2019   6.  Right leg edema 12/18/2015. Negative venous Doppler.   7.  Renal insufficiency   8.  Urinary retention June 2022-temporary Foley catheter placed, started on Flomax   9.  B12 deficiency-oral B12 initiated 12/15/2021   10.  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 Mateo appears stable.  Lasix dose was increased to 40 mg daily about a week ago.  He notes slight improvement in the edema.  Chest x-ray from yesterday shows improvement in the pleural effusions.  He understands the edema and pleural effusions may be related to [place].  He will try taking Gleevec on an every other day schedule to see if this helps.  He will continue Lasix as he is currently taking.  He is scheduled for an echo in the next few weeks.  We reviewed the CBC from today.  Hemoglobin is slowly declining.  He will receive an Aranesp injection today.  He will return for a chest x-ray and follow-up visit in 2 weeks.  He will contact the office in the interim with any problems.  Patient seen with Dr. [name].    Ned Card ANP/GNP-BC   12/22/2022  1:25 PM  This was a shared visit with Ned Card.  Mr. Muckleroy has persistent edema.  We discussed potential etiologies for the edema including low to pain, renal insufficiency, heart disease, and Gleevec.  I suspect the diffuse edema is related to [place].  He will continue Lasix at the increased dose and Gleevec will be dose reduced.  He will  return for an office visit and follow-up of  the pleural effusions.  We reviewed x-ray images with Mr. Wiegel and his son.  He will resume erythropoietin therapy.  I was present for greater than 50% of today's visit.  I performed medical decision making.  Julieanne Manson, MD

## 2022-12-22 NOTE — Telephone Encounter (Signed)
-----   Message from Ladell Pier, MD sent at 12/21/2022  7:34 PM EST ----- Please call patient, the chest x-ray shows small bilateral pleural effusions, the left-sided effusion has improved, call for increased shortness of breath, cough, fever-symptoms of pneumonia, follow-up as scheduled

## 2022-12-22 NOTE — Telephone Encounter (Signed)
Patient gave verbal understanding had no further questions or concerns. 

## 2022-12-22 NOTE — Patient Instructions (Signed)

## 2022-12-23 ENCOUNTER — Encounter: Payer: Self-pay | Admitting: Oncology

## 2022-12-30 ENCOUNTER — Inpatient Hospital Stay: Payer: Medicare Other

## 2022-12-30 ENCOUNTER — Inpatient Hospital Stay: Payer: Medicare Other | Admitting: Nurse Practitioner

## 2023-01-03 ENCOUNTER — Ambulatory Visit (HOSPITAL_BASED_OUTPATIENT_CLINIC_OR_DEPARTMENT_OTHER)
Admission: RE | Admit: 2023-01-03 | Discharge: 2023-01-03 | Disposition: A | Discharge status: Home / Self Care | Payer: Medicare Other | Source: Ambulatory Visit | Attending: Nurse Practitioner | Admitting: Nurse Practitioner

## 2023-01-03 DIAGNOSIS — C49A3 Gastrointestinal stromal tumor of small intestine: Secondary | ICD-10-CM | POA: Diagnosis not present

## 2023-01-03 DIAGNOSIS — I5031 Acute diastolic (congestive) heart failure: Secondary | ICD-10-CM | POA: Diagnosis not present

## 2023-01-03 DIAGNOSIS — J90 Pleural effusion, not elsewhere classified: Secondary | ICD-10-CM | POA: Diagnosis not present

## 2023-01-03 DIAGNOSIS — Z79899 Other long term (current) drug therapy: Secondary | ICD-10-CM | POA: Diagnosis not present

## 2023-01-04 LAB — BASIC METABOLIC PANEL
BUN/Creatinine Ratio: 9 — ABNORMAL LOW (ref 10–24)
BUN: 17 mg/dL (ref 10–36)
CO2: 26 mmol/L (ref 20–29)
Calcium: 8.6 mg/dL (ref 8.6–10.2)
Chloride: 107 mmol/L — ABNORMAL HIGH (ref 96–106)
Creatinine, Ser: 1.8 mg/dL — ABNORMAL HIGH (ref 0.76–1.27)
Glucose: 120 mg/dL — ABNORMAL HIGH (ref 70–99)
Potassium: 4.4 mmol/L (ref 3.5–5.2)
Sodium: 145 mmol/L — ABNORMAL HIGH (ref 134–144)
eGFR: 35 mL/min/{1.73_m2} — ABNORMAL LOW (ref >59)

## 2023-01-05 ENCOUNTER — Inpatient Hospital Stay (HOSPITAL_BASED_OUTPATIENT_CLINIC_OR_DEPARTMENT_OTHER): Payer: Medicare Other | Admitting: Nurse Practitioner

## 2023-01-05 ENCOUNTER — Encounter: Payer: Self-pay | Admitting: Oncology

## 2023-01-05 ENCOUNTER — Encounter: Payer: Self-pay | Admitting: Nurse Practitioner

## 2023-01-05 ENCOUNTER — Inpatient Hospital Stay: Payer: Medicare Other

## 2023-01-05 VITALS — BP 141/68 | HR 68 | Temp 98.1°F | Resp 18 | Ht 67.0 in | Wt 152.6 lb

## 2023-01-05 DIAGNOSIS — D631 Anemia in chronic kidney disease: Secondary | ICD-10-CM | POA: Diagnosis not present

## 2023-01-05 DIAGNOSIS — N183 Chronic kidney disease, stage 3 unspecified: Secondary | ICD-10-CM

## 2023-01-05 DIAGNOSIS — D649 Anemia, unspecified: Secondary | ICD-10-CM | POA: Diagnosis not present

## 2023-01-05 DIAGNOSIS — C786 Secondary malignant neoplasm of retroperitoneum and peritoneum: Secondary | ICD-10-CM | POA: Diagnosis not present

## 2023-01-05 DIAGNOSIS — E538 Deficiency of other specified B group vitamins: Secondary | ICD-10-CM | POA: Diagnosis not present

## 2023-01-05 DIAGNOSIS — C49A3 Gastrointestinal stromal tumor of small intestine: Secondary | ICD-10-CM

## 2023-01-05 DIAGNOSIS — C49A9 Gastrointestinal stromal tumor of other sites: Secondary | ICD-10-CM | POA: Diagnosis not present

## 2023-01-05 DIAGNOSIS — J90 Pleural effusion, not elsewhere classified: Secondary | ICD-10-CM

## 2023-01-05 DIAGNOSIS — I129 Hypertensive chronic kidney disease with stage 1 through stage 4 chronic kidney disease, or unspecified chronic kidney disease: Secondary | ICD-10-CM | POA: Diagnosis not present

## 2023-01-05 LAB — CMP (CANCER CENTER ONLY)
ALT: 11 U/L (ref 0–44)
AST: 19 U/L (ref 15–41)
Albumin: 3.9 g/dL (ref 3.5–5.0)
Alkaline Phosphatase: 40 U/L (ref 38–126)
Anion gap: 5 (ref 5–15)
BUN: 18 mg/dL (ref 8–23)
CO2: 26 mmol/L (ref 22–32)
Calcium: 8.8 mg/dL — ABNORMAL LOW (ref 8.9–10.3)
Chloride: 109 mmol/L (ref 98–111)
Creatinine: 1.53 mg/dL — ABNORMAL HIGH (ref 0.61–1.24)
GFR, Estimated: 43 mL/min — ABNORMAL LOW (ref >60)
Glucose, Bld: 102 mg/dL — ABNORMAL HIGH (ref 70–99)
Potassium: 4 mmol/L (ref 3.5–5.1)
Sodium: 140 mmol/L (ref 135–145)
Total Bilirubin: 0.8 mg/dL (ref 0.3–1.2)
Total Protein: 6.3 g/dL — ABNORMAL LOW (ref 6.5–8.1)

## 2023-01-05 LAB — CBC WITH DIFFERENTIAL (CANCER CENTER ONLY)
Abs Immature Granulocytes: 0.01 10*3/uL (ref 0.00–0.07)
Basophils Absolute: 0 10*3/uL (ref 0.0–0.1)
Basophils Relative: 0 %
Eosinophils Absolute: 0.1 10*3/uL (ref 0.0–0.5)
Eosinophils Relative: 2 %
HCT: 30.3 % — ABNORMAL LOW (ref 39.0–52.0)
Hemoglobin: 10.1 g/dL — ABNORMAL LOW (ref 13.0–17.0)
Immature Granulocytes: 0 %
Lymphocytes Relative: 16 %
Lymphs Abs: 0.7 10*3/uL (ref 0.7–4.0)
MCH: 36.2 pg — ABNORMAL HIGH (ref 26.0–34.0)
MCHC: 33.3 g/dL (ref 30.0–36.0)
MCV: 108.6 fL — ABNORMAL HIGH (ref 80.0–100.0)
Monocytes Absolute: 0.5 10*3/uL (ref 0.1–1.0)
Monocytes Relative: 11 %
Neutro Abs: 3.1 10*3/uL (ref 1.7–7.7)
Neutrophils Relative %: 71 %
Platelet Count: 187 10*3/uL (ref 150–400)
RBC: 2.79 MIL/uL — ABNORMAL LOW (ref 4.22–5.81)
RDW: 14.3 % (ref 11.5–15.5)
WBC Count: 4.4 10*3/uL (ref 4.0–10.5)
nRBC: 0 % (ref 0.0–0.2)

## 2023-01-05 NOTE — Progress Notes (Signed)
[facility] OFFICE PROGRESS NOTE   Diagnosis: Gastrointestinal stromal tumor  INTERVAL HISTORY:   Mr. Ferdig returns as scheduled.  Gleevec dose was adjusted to every other day beginning 12/22/2022 due to edema and pleural effusions.  He notes improvement in arm and leg edema.  He is less short of breath.  Objective:  Vital signs in last 24 hours:  Blood pressure (!) 141/68, pulse 68, temperature 98.1 F (36.7 C), temperature source Oral, resp. rate 18, height '5\' 7"'$  (1.702 m), weight 152 lb 9.6 oz (69.2 kg), SpO2 98 %.    HEENT: No thrush or ulcers. Resp: Lungs are clear.  Breath sounds diminished at the bases.  No respiratory distress. Cardio: Regular rate and rhythm. GI: Abdomen soft and nontender.  No hepatosplenomegaly.  No mass. Vascular: Trace edema lower leg bilaterally.  Right lower leg is slightly larger than the left lower leg.  This is chronic per patient report.   Lab Results:  Lab Results  Component Value Date   WBC 4.4 01/05/2023   HGB 10.1 (L) 01/05/2023   HCT 30.3 (L) 01/05/2023   MCV 108.6 (H) 01/05/2023   PLT 187 01/05/2023   NEUTROABS 3.1 01/05/2023    Imaging:  DG Chest 2 View  Result Date: 01/05/2023 CLINICAL DATA:  f/u pleural effusions. History of GIST tumor with peritoneal carcinomatosis. EXAM: CHEST - 2 VIEW COMPARISON:  Chest x-ray 12/20/2022, CT chest 11/22/2022 FINDINGS: The heart and mediastinal contours are unchanged. Aortic calcification. No focal consolidation. No pulmonary edema. Persistent bilateral small volume pleural effusions. No pneumothorax. No acute osseous abnormality. IMPRESSION: 1.  Persistent bilateral small volume pleural effusions. 2.  Aortic Atherosclerosis (ICD10-I70.0). Electronically Signed   By: Iven Finn M.D.   On: 01/05/2023 00:09    Medications: I have reviewed the patient's current medications.  Assessment/Plan: Gastrointestinal stromal tumor of small bowel, status post primary resection  06/04/2013 CT 10/16/2014 consistent with extensive carcinomatosis, CT-guided biopsy of an omental mass 10/21/2014 confirmed a gastrointestinal stromal tumor Initiation of Gleevec 10/31/2014 CT 01/13/2015 revealed improvement in the peritoneal and omental metastatic disease CT 05/05/2015 with no progression of omental/peritoneal metastatic disease, increased ascites, and appendix inflammation CT 07/07/2016-no abscess, mild perihepatic and left pericolic ascites and mesenteric edema CT of the pelvis 09/20/2016, no residual abscess, no ascites CT abdomen pelvis renal stones study 04/03/2017-partial small bowel obstruction, potentially related to right inguinal hernia CT abdomen/pelvis 08/18/2017-possible cholecystitis, 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  Cholelithiasis. CT renal stone study 06/04/2021-large right and small left inguinal hernias, no ascites CTs 11/22/2022-small bilateral pleural effusions small volume ascites, gallstones, large right inguinal hernia, borderline enlarged mediastinal nodes Gleevec 400 mg every other day beginning 12/22/2022 due to edema and pleural effusions     2. History of anemia, status post a nondiagnostic bone marrow biopsy 10/21/2014   3. NSTEMI March 2014   4. Admission 05/06/2015 with acute onset right abdomen pain-potentially related to acute appendicitis versus pain from carcinomatosis CT 05/13/2015 consistent with a right lower abdomen abscess, status post catheter drainage by interventional radiology 05/13/2015 Follow-up CT 05/19/2015 showed resolution of the abscess. Follow-up evaluation in interventional  radiology 06/01/2015 showed the abscess cavity had resolved. The abscess drainage catheter communicated with the  cecum via the appendix. The catheter was left in place. CT abdomen/pelvis 06/08/2015 showed the right pelvic drain in place without recurrent or residual surrounding fluid collection. Peritoneal metastasis with slight increase in small to moderate volume of abdominal pelvic ascites. Drainage catheter injection 06/12/2015 showed residual tiny fistulous connection with the end of the decompressed abscess cavity within the right lower abdominal quadrant and the residual lumen of the appendix. Paracentesis 06/12/2015 with 5 liters of fluid removed Paracentesis 06/19/2015-negative cytology and culture Removal of abscess drainage catheter 06/23/2015 CT 07/26/2015 with increased right abdominal wall inflammation and increased fluid collection at site of previous right abdomen abscess, ascites, and enlargement/inflammation of the appendix Placement of right lower quadrant percutaneous drainage catheter 07/27/2015. Contrast injection confirmed persistent fistulous connection with the ill-defined fluid collection and the residual appendix and cecum. Paracentesis with 2 L of fluid removed. Follow-up CT abdomen/pelvis 08/05/2015 with small to moderate left pleural effusion and moderate to large volume ascites with both appearing slightly decreased since the prior study. Right lower quadrant drainage catheter in place. No residual fluid collection around the drain or in the adjacent abdomen or pelvis. 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 Drainage catheter left in place. 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 [name]     5. C. difficile colitis 05/10/2015, [place] Cranker 2019   6.  Right leg edema 12/18/2015. Negative venous Doppler.   7.  Renal insufficiency   8.  Urinary retention June 2022-temporary Foley catheter placed, started on Flomax   9.  B12 deficiency-oral B12 initiated 12/15/2021   10.  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 Morrical appears improved.  Edema is less.  Chest x-ray from earlier this week shows stable bilateral small pleural effusions.  He will continue Gleevec at the current dose/schedule, 400 mg every other day.  CBC from today reviewed.  Hold Aranesp.    He is scheduled for a 2D echo next week.  Has follow-up with cardiology in 2 weeks.  He will return for lab, follow-up, possible Aranesp in 2 weeks.  He will contact the office in the interim with any problems.    Ned Card ANP/GNP-BC   01/05/2023  10:30 AM

## 2023-01-10 DIAGNOSIS — I10 Essential (primary) hypertension: Secondary | ICD-10-CM | POA: Diagnosis not present

## 2023-01-10 DIAGNOSIS — B354 Tinea corporis: Secondary | ICD-10-CM | POA: Diagnosis not present

## 2023-01-12 ENCOUNTER — Ambulatory Visit (HOSPITAL_COMMUNITY): Payer: Medicare Other | Attending: Cardiovascular Disease

## 2023-01-12 DIAGNOSIS — I5031 Acute diastolic (congestive) heart failure: Secondary | ICD-10-CM | POA: Diagnosis not present

## 2023-01-12 LAB — ECHOCARDIOGRAM COMPLETE
Area-P 1/2: 2.82 cm2
S' Lateral: 3.6 cm

## 2023-01-16 NOTE — Progress Notes (Unsigned)
Cardiology Clinic Note   Patient Name: [name] [name] Date of Encounter: 01/19/2023  Primary Care Provider:  Orpah Melter, MD Primary Cardiologist:  [name] Burow, MD  Patient Profile    [name] [name] is a 89 y.o. male with a past medical history of CAD per angiography, chronic diastolic heart failure, hypertension, hyperlipidemia, CKD stage IIIb who presents to the clinic today for follow-up.  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[place])    [name] [name] 03/13/2017   Hemorrhagic shock ([place]) 09/13/2014   Hyperlipidemia    TAKES zOCOR DAILY   Hypertension    TAKES LOTREL,HCTZ,AND METOPROLOL DAILY   Intraperitoneal hemorrhage 09/17/2014   NSTEMI (non-ST elevated myocardial infarction) ([place]) 02/2013   "LIGHT: ONE MD SAID HE DID AND ONE SAID HE DIDN'T   Pneumonia    MAR 2014   Shortness of breath    Stromal tumor of digestive system ([place])    UTI (urinary tract infection)    Past Surgical History:  Procedure Laterality Date   CARDIAC CATHETERIZATION  03-05-13   COLONOSCOPY  09/08/2014   dr scooler   CYST EXCISION  1962   wrist   IR GENERIC HISTORICAL  05/17/2016   IR RADIOLOGIST EVAL & MGMT 05/17/2016 [name] Keen, MD [facility] INTERV RAD   IR GENERIC HISTORICAL  07/26/2016   IR RADIOLOGIST EVAL & MGMT 07/26/2016 [name] Cadet, MD [facility] INTERV RAD   LAPAROTOMY N/A 06/04/2013   Procedure: EXPLORATORY LAPAROTOMY, OPEN RESECTION OF MESENTERIC AND INTESTINAL MASS;  Surgeon: [name] Hector, MD;  Location: [facility];  Service: General;  Laterality: N/A;  Small Bowel Resection   LEFT HEART CATHETERIZATION WITH CORONARY ANGIOGRAM N/A 03/05/2013   Procedure: LEFT HEART CATHETERIZATION WITH CORONARY ANGIOGRAM;  Surgeon: [name] M Martinique, MD;  Location: [facility] CATH LAB;   Service: Cardiovascular;  Laterality: N/A;    Allergies  No Known Allergies  History of Present Illness    Saw [name] [name] has a past medical history of: CAD. LHC 03/05/2013: LM 30%.  Occlusion of LAD after takeoff of D1 with excellent right to left collaterals.  Proximal LCx <20%.  Mid RCA 30 to 40%; it gives excellent collaterals through septal perforators to the LAD. Chronic diastolic heart failure. Echo 01/12/2023: EF 60 to 65%.  Mild concentric LVH.  RV mildly enlarged.  Moderately elevated pulmonary artery systolic pressure.  Severe LAE.  Moderate RAE.  Moderate left lateral pleural effusion.  Trivial AR.  Aortic valve sclerosis without stenosis. Hypertension. Hyperlipidemia. Lipid panel 02/21/2022: LDL 81, HDL 34, TG 91, total 133. CKD stage IIIb.  [name] [name] is followed by Dr. Gwenlyn [name] for the above outlined history.  Patient was last seen in the office by Dr. Gwenlyn [name] on 12/15/2022.  At that time he had visited the ER the night before secondary to volume overload, shortness of breath and DOE.  He was diuresed with IV Lasix and was clinically improved.  His echo was repeated as above and Lasix was increased to 40 mg daily.  All other medications were unchanged.  Today, patient is accompanied by his son.  Patient reports he is doing very well since his visit to the ED at the end of December.  He still has some mild lower extremity edema but he has not  noticed any edema in bilateral hands.  He reports brisk diuresis on current dose of Lasix.  He does not weigh every day and he does not wear compression socks.  He does use pneumatic lower leg massagers 3 times a day.  He denies shortness of breath, DOE, orthopnea, or PND.  He has started walking and lifting weights for exercise.  He complains of decreased appetite for which he is supplementing with Ensure.  He also reports poor sleep quality.  He is sleeping on a recliner but states that is more for comfort than difficulty breathing.  His son reports he  does nap throughout the day.    Home Medications    Current Meds  Medication Sig   amLODipine (NORVASC) 10 MG tablet Take 10 mg by mouth daily.   ferrous sulfate 325 (65 FE) MG EC tablet Take 1 tablet (325 mg total) by mouth 2 (two) times daily with a meal.   furosemide (LASIX) 40 MG tablet Take 1 tablet (40 mg total) by mouth daily.   imatinib (GLEEVEC) 400 MG tablet Take 1 tablet (400 mg total) by mouth daily. Take with meals and large glass of water.Caution:Chemotherapy.   loperamide (IMODIUM) 2 MG capsule Take 1 capsule (2 mg total) by mouth 4 (four) times daily as needed for diarrhea or loose stools.   metoprolol tartrate (LOPRESSOR) 25 MG tablet Take 1 tablet (25 mg total) by mouth 2 (two) times daily.   potassium chloride SA (KLOR-CON M) 20 MEQ tablet Take 1 tablet (20 mEq total) by mouth daily.   simvastatin (ZOCOR) 20 MG tablet Take 20 mg by mouth daily.    Family History    Family History  Problem Relation Age of Onset   Stroke Mother    Skin cancer Father    Skin cancer Brother    He indicated that his mother is deceased. He indicated that his father is deceased. He indicated that the status of his brother is unknown.   Social History    Social History   Socioeconomic History   Marital status: Married    Spouse name: [name] [name]   Number of children: 2   Years of education: Not on file   Highest education level: Not on file  Occupational History   Occupation: Retired    Comment: Writer  Tobacco Use   Smoking status: Never   Smokeless tobacco: Never  Vaping Use   Vaping Use: Never used  Substance and Sexual Activity   Alcohol use: No   Drug use: No   Sexual activity: Not Currently  Other Topics Concern   Not on file  Social History Narrative   Married, wife [name] [name]   Retired Environmental manager for 80 years   Independent in [place]   #2 grown sons   Social Determinants of Radio broadcast assistant Strain: Not on file  Food Insecurity:  Not on file  Transportation Needs: Not on file  Physical Activity: Not on file  Stress: Not on file  Social Connections: Not on file  Intimate Partner Violence: Not on file     Review of Systems    General:  No chills, fever, night sweats or weight changes.  Cardiovascular:  No chest pain, dyspnea on exertion, orthopnea, palpitations, paroxysmal nocturnal dyspnea. Mild lower extremity edema.  Dermatological: No rash, lesions/masses Respiratory: No cough, dyspnea Urologic: No hematuria, dysuria Abdominal:   No nausea, vomiting, diarrhea, bright red blood per rectum, melena, or hematemesis Neurologic:  No  visual changes, weakness, changes in mental status. All other systems reviewed and are otherwise negative except as noted above.  Physical Exam    VS:  BP (!) 146/50 (BP Location: Left Arm, Patient Position: Sitting, Cuff Size: Normal)   Pulse 65   Ht '5\' 7"'$  (1.702 m)   Wt 153 lb 6.4 oz (69.6 kg)   SpO2 95%   BMI 24.03 kg/m  , BMI Body mass index is 24.03 kg/m. GEN:  Well nourished, well developed, in no acute distress. HEENT: Normal. Neck: Supple, no JVD, carotid bruits, or masses. Cardiac: RRR, no murmurs, rubs, or gallops. No clubbing or cyanosis. Trace lower extremity edema bilaterally. Radials/DP/PT 2+ and equal bilaterally.  Respiratory:  Respirations regular and unlabored, clear to auscultation bilaterally. GI: Soft, nontender, nondistended. MS: No deformity or atrophy. Skin: Warm and dry, no rash. Neuro: Strength and sensation are intact. Psych: Normal affect.  Accessory Clinical Findings     Recent Labs: 12/14/2022: B Natriuretic Peptide 800.4 12/22/2022: Magnesium 1.9 01/05/2023: ALT 11; BUN 18; Creatinine 1.53; Hemoglobin 10.1; Platelet Count 187; Potassium 4.0; Sodium 140   Recent Lipid Panel Lipid panel 02/21/2022: LDL 81, HDL 34, TG 91, total 133.  ECG is not indicated today.    Assessment & Plan   Chronic diastolic heart failure.  Echo January 2024  showed EF 60 to 65%, moderately enlarged RV, moderately elevated pulmonary artery systolic pressure, severe LAE, moderate RAE, moderate left lateral pleural effusion.  Patient denies further issues with edema or shortness of breath since his ED visit at the end of December 2023.  He reports brisk diuresis on current dose of Lasix.  He is able to walk and lift weights for exercise without shortness of breath.  Euvolemic and well compensated on exam.  Kidney function is stable.  Continue Lasix and metoprolol. CAD.  LHC March 2014 occlusion of LAD with excellent right to left collaterals and collateral/septal perforators.  Patient denies chest pain, tightness, or pressure.  Continue metoprolol and Zocor. Hypertension.  BP today 146/50.  Patient denies headaches or dizziness.  Continue amlodipine and metoprolol. Hyperlipidemia.  LDL March 2023 81.  Continue Zocor.      Disposition: Return for previously scheduled follow-up with Dr. Gwenlyn [name].    [name] [name]. [name] Bielicki, DNP, NP-C     01/19/2023, 1:20 PM [place] [address] Office [phone] Fax [phone]

## 2023-01-18 DIAGNOSIS — H40013 Open angle with borderline findings, low risk, bilateral: Secondary | ICD-10-CM | POA: Diagnosis not present

## 2023-01-19 ENCOUNTER — Ambulatory Visit: Payer: Medicare Other | Attending: General Practice | Admitting: Student

## 2023-01-19 ENCOUNTER — Encounter: Payer: Self-pay | Admitting: General Practice

## 2023-01-19 VITALS — BP 146/50 | HR 65 | Ht 67.0 in | Wt 153.4 lb

## 2023-01-19 DIAGNOSIS — E782 Mixed hyperlipidemia: Secondary | ICD-10-CM | POA: Diagnosis not present

## 2023-01-19 DIAGNOSIS — I2510 Atherosclerotic heart disease of native coronary artery without angina pectoris: Secondary | ICD-10-CM | POA: Insufficient documentation

## 2023-01-19 DIAGNOSIS — I10 Essential (primary) hypertension: Secondary | ICD-10-CM | POA: Diagnosis not present

## 2023-01-19 DIAGNOSIS — I5032 Chronic diastolic (congestive) heart failure: Secondary | ICD-10-CM | POA: Diagnosis not present

## 2023-01-19 NOTE — Patient Instructions (Signed)
Medication Instructions:  The current medical regimen is effective;  continue present plan and medications as directed. Please refer to the Current Medication list given to you today.  *If you need a refill on your cardiac medications before your next appointment, please call your pharmacy*  Lab Work: NONE If you have labs (blood work) drawn today and your tests are completely normal, you will receive your results only by: [place] (if you have MyChart) OR A paper copy in the mail If you have any lab test that is abnormal or we need to change your treatment, we will call you to review the results.  Testing/Procedures: NONE  Follow-Up: At [facility], you and your health needs are our priority.  As part of our continuing mission to provide you with exceptional heart care, we have created designated Provider Care Teams.  These Care Teams include your primary Cardiologist (physician) and Advanced Practice Providers (APPs -  Physician Assistants and Nurse Practitioners) who all work together to provide you with the care you need, when you need it.  Your next appointment:   KEEP SCHEDULED APPOINTMENT   Provider:   Quay Burow, MD     Other Instructions

## 2023-01-23 ENCOUNTER — Inpatient Hospital Stay (HOSPITAL_BASED_OUTPATIENT_CLINIC_OR_DEPARTMENT_OTHER): Payer: Medicare Other | Admitting: Oncology

## 2023-01-23 ENCOUNTER — Inpatient Hospital Stay: Payer: Medicare Other

## 2023-01-23 ENCOUNTER — Inpatient Hospital Stay: Payer: Medicare Other | Attending: Nurse Practitioner

## 2023-01-23 VITALS — BP 159/59 | HR 74 | Temp 98.1°F | Resp 18 | Ht 67.0 in | Wt 152.0 lb

## 2023-01-23 DIAGNOSIS — D649 Anemia, unspecified: Secondary | ICD-10-CM | POA: Diagnosis not present

## 2023-01-23 DIAGNOSIS — N183 Chronic kidney disease, stage 3 unspecified: Secondary | ICD-10-CM

## 2023-01-23 DIAGNOSIS — I130 Hypertensive heart and chronic kidney disease with heart failure and stage 1 through stage 4 chronic kidney disease, or unspecified chronic kidney disease: Secondary | ICD-10-CM | POA: Insufficient documentation

## 2023-01-23 DIAGNOSIS — Z8509 Personal history of malignant neoplasm of other digestive organs: Secondary | ICD-10-CM | POA: Diagnosis not present

## 2023-01-23 DIAGNOSIS — Z79899 Other long term (current) drug therapy: Secondary | ICD-10-CM | POA: Diagnosis not present

## 2023-01-23 DIAGNOSIS — I5033 Acute on chronic diastolic (congestive) heart failure: Secondary | ICD-10-CM | POA: Insufficient documentation

## 2023-01-23 DIAGNOSIS — D6481 Anemia due to antineoplastic chemotherapy: Secondary | ICD-10-CM | POA: Diagnosis present

## 2023-01-23 DIAGNOSIS — E538 Deficiency of other specified B group vitamins: Secondary | ICD-10-CM | POA: Diagnosis not present

## 2023-01-23 DIAGNOSIS — J811 Chronic pulmonary edema: Secondary | ICD-10-CM | POA: Diagnosis not present

## 2023-01-23 DIAGNOSIS — C786 Secondary malignant neoplasm of retroperitoneum and peritoneum: Secondary | ICD-10-CM | POA: Diagnosis not present

## 2023-01-23 DIAGNOSIS — D631 Anemia in chronic kidney disease: Secondary | ICD-10-CM | POA: Diagnosis not present

## 2023-01-23 DIAGNOSIS — C49A3 Gastrointestinal stromal tumor of small intestine: Secondary | ICD-10-CM

## 2023-01-23 LAB — CBC WITH DIFFERENTIAL (CANCER CENTER ONLY)
Abs Immature Granulocytes: 0.01 10*3/uL (ref 0.00–0.07)
Basophils Absolute: 0 10*3/uL (ref 0.0–0.1)
Basophils Relative: 0 %
Eosinophils Absolute: 0.2 10*3/uL (ref 0.0–0.5)
Eosinophils Relative: 3 %
HCT: 28.3 % — ABNORMAL LOW (ref 39.0–52.0)
Hemoglobin: 9.4 g/dL — ABNORMAL LOW (ref 13.0–17.0)
Immature Granulocytes: 0 %
Lymphocytes Relative: 16 %
Lymphs Abs: 0.7 10*3/uL (ref 0.7–4.0)
MCH: 35.9 pg — ABNORMAL HIGH (ref 26.0–34.0)
MCHC: 33.2 g/dL (ref 30.0–36.0)
MCV: 108 fL — ABNORMAL HIGH (ref 80.0–100.0)
Monocytes Absolute: 0.5 10*3/uL (ref 0.1–1.0)
Monocytes Relative: 12 %
Neutro Abs: 3.1 10*3/uL (ref 1.7–7.7)
Neutrophils Relative %: 69 %
Platelet Count: 229 10*3/uL (ref 150–400)
RBC: 2.62 MIL/uL — ABNORMAL LOW (ref 4.22–5.81)
RDW: 13.2 % (ref 11.5–15.5)
WBC Count: 4.6 10*3/uL (ref 4.0–10.5)
nRBC: 0 % (ref 0.0–0.2)

## 2023-01-23 LAB — CMP (CANCER CENTER ONLY)
ALT: 19 U/L (ref 0–44)
AST: 20 U/L (ref 15–41)
Albumin: 3.9 g/dL (ref 3.5–5.0)
Alkaline Phosphatase: 42 U/L (ref 38–126)
Anion gap: 7 (ref 5–15)
BUN: 33 mg/dL — ABNORMAL HIGH (ref 8–23)
CO2: 29 mmol/L (ref 22–32)
Calcium: 9.1 mg/dL (ref 8.9–10.3)
Chloride: 107 mmol/L (ref 98–111)
Creatinine: 1.47 mg/dL — ABNORMAL HIGH (ref 0.61–1.24)
GFR, Estimated: 45 mL/min — ABNORMAL LOW (ref >60)
Glucose, Bld: 119 mg/dL — ABNORMAL HIGH (ref 70–99)
Potassium: 4.6 mmol/L (ref 3.5–5.1)
Sodium: 143 mmol/L (ref 135–145)
Total Bilirubin: 0.5 mg/dL (ref 0.3–1.2)
Total Protein: 6.7 g/dL (ref 6.5–8.1)

## 2023-01-23 MED ORDER — DARBEPOETIN ALFA 100 MCG/0.5ML IJ SOSY
100.0000 ug | PREFILLED_SYRINGE | Freq: Once | INTRAMUSCULAR | Status: AC
  Administered 2023-01-23: 100 ug via SUBCUTANEOUS
  Filled 2023-01-23: qty 0.5

## 2023-01-23 NOTE — Progress Notes (Signed)
[name] [name] OFFICE PROGRESS NOTE   Diagnosis: Gastrointestinal stromal tumor, anemia  INTERVAL HISTORY:   [name] [name] returns as scheduled.  He is taking Gleevec every other day.  He reports improvement in his energy level with the increased Lasix dose.  He saw cardiology last week.  He continues the same dose of Lasix.  He reports improvement in edema.  He has soreness at the right lower gumline.  He wonders whether this is related to ill fitting dentures.  Objective:  Vital signs in last 24 hours:  Blood pressure (!) 159/59, pulse 74, temperature 98.1 F (36.7 C), temperature source Oral, resp. rate 18, height '5\' 7"'$  (1.702 m), weight 152 lb (68.9 kg), SpO2 98 %.    HEENT: Upper and lower denture plate.  No thrush.  No ulceration noted at the right lower gumline Resp: Decreased breath sounds with dullness at the right lower posterior chest, no respiratory distress Cardio: Regular rate and rhythm GI: No hepatosplenomegaly, nontender, soft right inguinal hernia Vascular: Trace lower leg edema bilaterally   Lab Results:  Lab Results  Component Value Date   WBC 4.6 01/23/2023   HGB 9.4 (L) 01/23/2023   HCT 28.3 (L) 01/23/2023   MCV 108.0 (H) 01/23/2023   PLT 229 01/23/2023   NEUTROABS 3.1 01/23/2023    CMP  Lab Results  Component Value Date   NA 143 01/23/2023   K 4.6 01/23/2023   CL 107 01/23/2023   CO2 29 01/23/2023   GLUCOSE 119 (H) 01/23/2023   BUN 33 (H) 01/23/2023   CREATININE 1.47 (H) 01/23/2023   CALCIUM 9.1 01/23/2023   PROT 6.7 01/23/2023   ALBUMIN 3.9 01/23/2023   AST 20 01/23/2023   ALT 19 01/23/2023   ALKPHOS 42 01/23/2023   BILITOT 0.5 01/23/2023   GFRNONAA 45 (L) 01/23/2023   GFRAA 51 (L) 08/05/2020    Medications: I have reviewed the patient's current medications.  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 CT 10/16/2014 consistent with extensive carcinomatosis,  CT-guided biopsy of an omental mass 10/21/2014 confirmed a gastrointestinal stromal tumor Initiation of Gleevec 10/31/2014 CT 01/13/2015 revealed improvement in the peritoneal and omental metastatic disease CT 05/05/2015 with no progression of omental/peritoneal metastatic disease, increased ascites, and appendix inflammation CT 07/07/2016-no abscess, mild perihepatic and left pericolic ascites and mesenteric edema CT of the pelvis 09/20/2016, no residual abscess, no ascites CT abdomen pelvis renal stones study 04/03/2017-partial small bowel obstruction, potentially related to right inguinal hernia CT abdomen/pelvis 08/18/2017-possible cholecystitis, 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  Cholelithiasis. CT renal stone study 06/04/2021-large right and small left inguinal hernias, no ascites CTs 11/22/2022-small bilateral pleural effusions small volume ascites, gallstones, large right inguinal hernia, borderline enlarged mediastinal nodes Gleevec 400 mg every other day beginning 12/22/2022 due to edema and pleural effusions     2. History of anemia, status post a nondiagnostic bone marrow biopsy 10/21/2014   3. NSTEMI March 2014   4. Admission 05/06/2015 with acute onset right abdomen pain-potentially related to acute appendicitis versus pain from carcinomatosis CT 05/13/2015 consistent with a right lower abdomen abscess, status post catheter drainage by interventional radiology 05/13/2015 Follow-up CT 05/19/2015 showed resolution of the abscess. Follow-up  evaluation in interventional radiology 06/01/2015 showed the abscess cavity had resolved. The abscess  drainage catheter communicated with the cecum via the appendix. The catheter was left in place. CT abdomen/pelvis 06/08/2015 showed the right pelvic drain in place without recurrent or residual surrounding fluid collection. Peritoneal metastasis with slight increase in small to moderate volume of abdominal pelvic ascites. Drainage catheter injection 06/12/2015 showed residual tiny fistulous connection with the end of the decompressed abscess cavity within the right lower abdominal quadrant and the residual lumen of the appendix. Paracentesis 06/12/2015 with 5 liters of fluid removed Paracentesis 06/19/2015-negative cytology and culture Removal of abscess drainage catheter 06/23/2015 CT 07/26/2015 with increased right abdominal wall inflammation and increased fluid collection at site of previous right abdomen abscess, ascites, and enlargement/inflammation of the appendix Placement of right lower quadrant percutaneous drainage catheter 07/27/2015. Contrast injection confirmed persistent fistulous connection with the ill-defined fluid collection and the residual appendix and cecum. Paracentesis with 2 L of fluid removed. Follow-up CT abdomen/pelvis 08/05/2015 with small to moderate left pleural effusion and moderate to large volume ascites with both appearing slightly decreased since the prior study. Right lower quadrant drainage catheter in place. No residual fluid collection around the drain or in the adjacent abdomen or pelvis. 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 Drainage catheter left in place. Tube evaluation 02/10/2016, persistent fistula Tube evaluation 03/01/2016, continued fistula to the cecum Tube downsized 05/04/2016, no residual abscess seen Collapsed abscess with a stable fistula to the cecum on imaging 06/16/2016 Tube removed 07/26/2016 Recurrent drainage from the right lower quadrant tube site August 2018, status post antibiotics with  improvement, drainage again November 2018-antibiotic prescribed by Dr. [name]     5. C. difficile colitis 05/10/2015, Lamount Cranker 2019   6.  Right leg edema 12/18/2015. Negative venous Doppler.   7.  Renal insufficiency   8.  Urinary retention June 2022-temporary Foley catheter placed, started on Flomax   9.  B12 deficiency-oral B12 initiated 12/15/2021   10.  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 [name] appears well.  His clinical status has improved with the increased Lasix dose and changing the Gleevec to every other day dosing.  He will continue Gleevec at the current dose.  His physical exam is consistent with at least a persistent right pleural effusion.  He will call for increased dyspnea.  [name] [name] will receive Aranesp today.  He will return for an office and lab visit in 1 month.  He schedule a follow-up appointment with his dentist to evaluate the soreness at the right lower gum. Betsy Coder, MD  01/23/2023  10:56 AM

## 2023-02-07 DIAGNOSIS — E782 Mixed hyperlipidemia: Secondary | ICD-10-CM | POA: Diagnosis not present

## 2023-02-07 DIAGNOSIS — C49A3 Gastrointestinal stromal tumor of small intestine: Secondary | ICD-10-CM | POA: Diagnosis not present

## 2023-02-07 DIAGNOSIS — I2510 Atherosclerotic heart disease of native coronary artery without angina pectoris: Secondary | ICD-10-CM | POA: Diagnosis not present

## 2023-02-07 DIAGNOSIS — N1832 Chronic kidney disease, stage 3b: Secondary | ICD-10-CM | POA: Diagnosis not present

## 2023-02-07 DIAGNOSIS — I10 Essential (primary) hypertension: Secondary | ICD-10-CM | POA: Diagnosis not present

## 2023-02-07 DIAGNOSIS — I700 Atherosclerosis of aorta: Secondary | ICD-10-CM | POA: Diagnosis not present

## 2023-02-07 DIAGNOSIS — R634 Abnormal weight loss: Secondary | ICD-10-CM | POA: Diagnosis not present

## 2023-02-07 DIAGNOSIS — G4700 Insomnia, unspecified: Secondary | ICD-10-CM | POA: Diagnosis not present

## 2023-02-07 DIAGNOSIS — I5032 Chronic diastolic (congestive) heart failure: Secondary | ICD-10-CM | POA: Diagnosis not present

## 2023-02-17 DIAGNOSIS — G4700 Insomnia, unspecified: Secondary | ICD-10-CM | POA: Diagnosis not present

## 2023-02-21 DIAGNOSIS — I10 Essential (primary) hypertension: Secondary | ICD-10-CM | POA: Diagnosis not present

## 2023-02-21 DIAGNOSIS — C49A3 Gastrointestinal stromal tumor of small intestine: Secondary | ICD-10-CM | POA: Diagnosis not present

## 2023-02-21 DIAGNOSIS — G4700 Insomnia, unspecified: Secondary | ICD-10-CM | POA: Diagnosis not present

## 2023-02-21 DIAGNOSIS — E782 Mixed hyperlipidemia: Secondary | ICD-10-CM | POA: Diagnosis not present

## 2023-02-21 DIAGNOSIS — I2510 Atherosclerotic heart disease of native coronary artery without angina pectoris: Secondary | ICD-10-CM | POA: Diagnosis not present

## 2023-02-21 DIAGNOSIS — N1832 Chronic kidney disease, stage 3b: Secondary | ICD-10-CM | POA: Diagnosis not present

## 2023-02-21 DIAGNOSIS — I5032 Chronic diastolic (congestive) heart failure: Secondary | ICD-10-CM | POA: Diagnosis not present

## 2023-02-21 DIAGNOSIS — I700 Atherosclerosis of aorta: Secondary | ICD-10-CM | POA: Diagnosis not present

## 2023-02-22 ENCOUNTER — Encounter: Payer: Self-pay | Admitting: Nurse Practitioner

## 2023-02-22 ENCOUNTER — Inpatient Hospital Stay: Payer: Medicare Other | Attending: Nurse Practitioner

## 2023-02-22 ENCOUNTER — Inpatient Hospital Stay (HOSPITAL_BASED_OUTPATIENT_CLINIC_OR_DEPARTMENT_OTHER): Payer: Medicare Other | Admitting: Nurse Practitioner

## 2023-02-22 ENCOUNTER — Inpatient Hospital Stay: Payer: Medicare Other

## 2023-02-22 VITALS — BP 142/67 | HR 71 | Temp 98.1°F | Resp 18 | Ht 67.0 in | Wt 158.0 lb

## 2023-02-22 DIAGNOSIS — D649 Anemia, unspecified: Secondary | ICD-10-CM

## 2023-02-22 DIAGNOSIS — C49A3 Gastrointestinal stromal tumor of small intestine: Secondary | ICD-10-CM | POA: Diagnosis not present

## 2023-02-22 DIAGNOSIS — D6481 Anemia due to antineoplastic chemotherapy: Secondary | ICD-10-CM | POA: Diagnosis present

## 2023-02-22 DIAGNOSIS — C786 Secondary malignant neoplasm of retroperitoneum and peritoneum: Secondary | ICD-10-CM | POA: Diagnosis not present

## 2023-02-22 DIAGNOSIS — N183 Chronic kidney disease, stage 3 unspecified: Secondary | ICD-10-CM | POA: Insufficient documentation

## 2023-02-22 DIAGNOSIS — C784 Secondary malignant neoplasm of small intestine: Secondary | ICD-10-CM | POA: Insufficient documentation

## 2023-02-22 DIAGNOSIS — D631 Anemia in chronic kidney disease: Secondary | ICD-10-CM | POA: Diagnosis not present

## 2023-02-22 DIAGNOSIS — E538 Deficiency of other specified B group vitamins: Secondary | ICD-10-CM | POA: Diagnosis not present

## 2023-02-22 LAB — CMP (CANCER CENTER ONLY)
ALT: 35 U/L (ref 0–44)
AST: 25 U/L (ref 15–41)
Albumin: 3.9 g/dL (ref 3.5–5.0)
Alkaline Phosphatase: 64 U/L (ref 38–126)
Anion gap: 7 (ref 5–15)
BUN: 35 mg/dL — ABNORMAL HIGH (ref 8–23)
CO2: 30 mmol/L (ref 22–32)
Calcium: 8.7 mg/dL — ABNORMAL LOW (ref 8.9–10.3)
Chloride: 105 mmol/L (ref 98–111)
Creatinine: 1.66 mg/dL — ABNORMAL HIGH (ref 0.61–1.24)
GFR, Estimated: 39 mL/min — ABNORMAL LOW (ref >60)
Glucose, Bld: 178 mg/dL — ABNORMAL HIGH (ref 70–99)
Potassium: 4 mmol/L (ref 3.5–5.1)
Sodium: 142 mmol/L (ref 135–145)
Total Bilirubin: 0.4 mg/dL (ref 0.3–1.2)
Total Protein: 6.6 g/dL (ref 6.5–8.1)

## 2023-02-22 LAB — CBC WITH DIFFERENTIAL (CANCER CENTER ONLY)
Abs Immature Granulocytes: 0.03 10*3/uL (ref 0.00–0.07)
Basophils Absolute: 0 10*3/uL (ref 0.0–0.1)
Basophils Relative: 1 %
Eosinophils Absolute: 0.7 10*3/uL — ABNORMAL HIGH (ref 0.0–0.5)
Eosinophils Relative: 10 %
HCT: 30 % — ABNORMAL LOW (ref 39.0–52.0)
Hemoglobin: 9.8 g/dL — ABNORMAL LOW (ref 13.0–17.0)
Immature Granulocytes: 0 %
Lymphocytes Relative: 11 %
Lymphs Abs: 0.8 10*3/uL (ref 0.7–4.0)
MCH: 35.4 pg — ABNORMAL HIGH (ref 26.0–34.0)
MCHC: 32.7 g/dL (ref 30.0–36.0)
MCV: 108.3 fL — ABNORMAL HIGH (ref 80.0–100.0)
Monocytes Absolute: 0.6 10*3/uL (ref 0.1–1.0)
Monocytes Relative: 8 %
Neutro Abs: 5.4 10*3/uL (ref 1.7–7.7)
Neutrophils Relative %: 70 %
Platelet Count: 226 10*3/uL (ref 150–400)
RBC: 2.77 MIL/uL — ABNORMAL LOW (ref 4.22–5.81)
RDW: 12.2 % (ref 11.5–15.5)
WBC Count: 7.7 10*3/uL (ref 4.0–10.5)
nRBC: 0 % (ref 0.0–0.2)

## 2023-02-22 MED ORDER — DARBEPOETIN ALFA 100 MCG/0.5ML IJ SOSY
100.0000 ug | PREFILLED_SYRINGE | Freq: Once | INTRAMUSCULAR | Status: AC
  Administered 2023-02-22: 100 ug via SUBCUTANEOUS
  Filled 2023-02-22: qty 0.5

## 2023-02-22 NOTE — Patient Instructions (Signed)

## 2023-02-22 NOTE — Progress Notes (Signed)
[name] [name] OFFICE PROGRESS NOTE   Diagnosis: Gastrointestinal stromal tumor, anemia  INTERVAL HISTORY:   [name] [name] returns as scheduled.  He continues Gleevec every other day.  Last Aranesp injection 01/23/2023.  Lower extremity continues to be improved.  He denies nausea/vomiting.  No diarrhea.  No rash.  He denies significant shortness of breath.  Objective:  Vital signs in last 24 hours:  Blood pressure (!) 142/67, pulse 71, temperature 98.1 F (36.7 C), temperature source Oral, resp. rate 18, height '5\' 7"'$  (1.702 m), weight 158 lb (71.7 kg), SpO2 98 %.    HEENT: No thrush or ulcers. Resp: Decreased breath sounds right lower lung field.  No respiratory distress. Cardio: Regular rate and rhythm. GI: Abdomen soft and nontender.  No hepatosplenomegaly. Vascular: Trace edema lower leg bilaterally. Skin: No rash.   Lab Results:  Lab Results  Component Value Date   WBC 7.7 02/22/2023   HGB 9.8 (L) 02/22/2023   HCT 30.0 (L) 02/22/2023   MCV 108.3 (H) 02/22/2023   PLT 226 02/22/2023   NEUTROABS 5.4 02/22/2023    Imaging:  No results found.  Medications: I have reviewed the patient's current medications.  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 CT 10/16/2014 consistent with extensive carcinomatosis, CT-guided biopsy of an omental mass 10/21/2014 confirmed a gastrointestinal stromal tumor Initiation of Gleevec 10/31/2014 CT 01/13/2015 revealed improvement in the peritoneal and omental metastatic disease CT 05/05/2015 with no progression of omental/peritoneal metastatic disease, increased ascites, and appendix inflammation CT 07/07/2016-no abscess, mild perihepatic and left pericolic ascites and mesenteric edema CT of the pelvis 09/20/2016, no residual abscess, no ascites CT abdomen pelvis renal stones study 04/03/2017-partial small bowel obstruction, potentially related to right inguinal  hernia CT abdomen/pelvis 08/18/2017-possible cholecystitis, 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  Cholelithiasis. CT renal stone study 06/04/2021-large right and small left inguinal hernias, no ascites CTs 11/22/2022-small bilateral pleural effusions small volume ascites, gallstones, large right inguinal hernia, borderline enlarged mediastinal nodes Gleevec 400 mg every other day beginning 12/22/2022 due to edema and pleural effusions     2. History of anemia, status post a nondiagnostic bone marrow biopsy 10/21/2014   3. NSTEMI March 2014   4. Admission 05/06/2015 with acute onset right abdomen pain-potentially related to acute appendicitis versus pain from carcinomatosis CT 05/13/2015 consistent with a right lower abdomen abscess, status post catheter drainage by interventional radiology 05/13/2015 Follow-up CT 05/19/2015 showed resolution of the abscess. Follow-up evaluation in interventional radiology 06/01/2015 showed the abscess cavity had resolved. The abscess drainage catheter communicated with the cecum via the appendix. The catheter was left in place. CT abdomen/pelvis 06/08/2015 showed the right pelvic drain in place without recurrent or residual surrounding fluid collection. Peritoneal metastasis with slight increase in small to moderate volume of abdominal pelvic ascites. Drainage catheter injection 06/12/2015 showed residual tiny fistulous connection with the end of the decompressed abscess cavity within the right lower abdominal quadrant and the residual lumen of the appendix. Paracentesis 06/12/2015 with 5 liters of fluid  removed Paracentesis 06/19/2015-negative cytology and culture Removal of abscess drainage catheter 06/23/2015 CT 07/26/2015 with increased right abdominal wall inflammation and increased fluid collection at site of previous right abdomen abscess,  ascites, and enlargement/inflammation of the appendix Placement of right lower quadrant percutaneous drainage catheter 07/27/2015. Contrast injection confirmed persistent fistulous connection with the ill-defined fluid collection and the residual appendix and cecum. Paracentesis with 2 L of fluid removed. Follow-up CT abdomen/pelvis 08/05/2015 with small to moderate left pleural effusion and moderate to large volume ascites with both appearing slightly decreased since the prior study. Right lower quadrant drainage catheter in place. No residual fluid collection around the drain or in the adjacent abdomen or pelvis. 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 Drainage catheter left in place. 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 [name]     5. C. difficile colitis 05/10/2015, Lamount Cranker 2019   6.  Right leg edema 12/18/2015. Negative venous Doppler.   7.  Renal insufficiency   8.  Urinary retention June 2022-temporary Foley catheter placed, started on Flomax   9.  B12 deficiency-oral B12 initiated 12/15/2021   10.  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  He will continue Gleevec every other day.  We reviewed the CBC from today.   Hemoglobin is stable.  He will receive an Aranesp injection today.  He will return for lab, follow-up, injection in 4 weeks.  We are available to see him sooner if needed.    Ned Card ANP/GNP-BC   02/22/2023  1:52 PM

## 2023-03-07 ENCOUNTER — Ambulatory Visit: Payer: Medicare Other | Attending: Cardiovascular Disease | Admitting: Cardiovascular Disease

## 2023-03-07 ENCOUNTER — Encounter: Payer: Self-pay | Admitting: Cardiovascular Disease

## 2023-03-07 VITALS — BP 152/60 | HR 60 | Ht 67.0 in | Wt 156.0 lb

## 2023-03-07 DIAGNOSIS — I2510 Atherosclerotic heart disease of native coronary artery without angina pectoris: Secondary | ICD-10-CM | POA: Diagnosis not present

## 2023-03-07 DIAGNOSIS — I10 Essential (primary) hypertension: Secondary | ICD-10-CM | POA: Diagnosis not present

## 2023-03-07 DIAGNOSIS — I5033 Acute on chronic diastolic (congestive) heart failure: Secondary | ICD-10-CM | POA: Diagnosis not present

## 2023-03-07 DIAGNOSIS — E782 Mixed hyperlipidemia: Secondary | ICD-10-CM

## 2023-03-07 NOTE — Assessment & Plan Note (Signed)
History of hyperlipidemia on Zocor with lipid profile performed 02/21/2023 revealing a total cholesterol 145, LDL of 82 and HDL 47.

## 2023-03-07 NOTE — Patient Instructions (Signed)
Medication Instructions:  Your physician recommends that you continue on your current medications as directed. Please refer to the Current Medication list given to you today.  *If you need a refill on your cardiac medications before your next appointment, please call your pharmacy*    Follow-Up: At [facility], you and your health needs are our priority.  As part of our continuing mission to provide you with exceptional heart care, we have created designated Provider Care Teams.  These Care Teams include your primary Cardiologist (physician) and Advanced Practice Providers (APPs -  Physician Assistants and Nurse Practitioners) who all work together to provide you with the care you need, when you need it.  We recommend signing up for the patient portal called "MyChart".  Sign up information is provided on this After Visit Summary.  MyChart is used to connect with patients for Virtual Visits (Telemedicine).  Patients are able to view lab/test results, encounter notes, upcoming appointments, etc.  Non-urgent messages can be sent to your provider as well.   To learn more about what you can do with MyChart, go to [email or web address].    Your next appointment:   6 month(s)  Provider:   Mayra Reel, NP      Then, Quay Burow, MD will plan to see you again in 12 month(s).

## 2023-03-07 NOTE — Progress Notes (Signed)
03/07/2023 Jamahl Mcnemar   1929/09/10  FP:[id number]  Primary Physician Orpah Melter, MD Primary Cardiologist: Lorretta Harp MD Lupe Carney, [place]  HPI:  [name] [name] is a 89 y.o.   mildly overweight married Caucasian male father of 2 children, grandfather 4 grandchildren is accompanied by his son Ronalee Belts today.  He was referred by the emergency room who we saw last night because of heart failure.  I last saw him in the office 12/15/2022.  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  He lives independently with his wife.  He does have a history of treated hypertension and hyperlipidemia.  He is never had a stroke.  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 Blair Promise on chemotherapy.  He is known CAD status post remote cath revealing an occluded LAD with bidirectional collaterals medically treated.  His last echo performed 09/27/2020 revealed normal LV systolic function without valvular abnormalities.  He was seen in the ER last night with volume overload, shortness of breath and dyspnea.  His BNP was 800.  Chest x-ray showed bilateral pleural effusions.  His serum creatinine was in the high 1 range.  He was given IV Lasix 40 mg and diuresed 1 L.  He felt clinically improved.  Since I saw him in the office 3 months ago he continues to do well.  His furosemide was doubled to 40 mg a day.  He has minimal lower extremity edema.  His lungs are clear.  His weight is stable.  His serum creatinine is over proxy 1.6.  He tells me that he is travel to 22 country since he retired 30 years ago.   Current Meds  Medication Sig   amLODipine (NORVASC) 10 MG tablet Take 10 mg by mouth daily.   ferrous sulfate 325 (65 FE) MG EC tablet Take 1 tablet (325 mg total) by mouth 2 (two) times daily with a meal.   furosemide (LASIX) 40 MG tablet Take 1 tablet (40 mg total) by mouth daily.   imatinib (GLEEVEC) 400 MG tablet Take 1 tablet (400 mg total) by mouth daily. Take with meals and  large glass of water.Caution:Chemotherapy.   ketoconazole (NIZORAL) 2 % cream Apply 1 Application topically daily.   loperamide (IMODIUM) 2 MG capsule Take 1 capsule (2 mg total) by mouth 4 (four) times daily as needed for diarrhea or loose stools.   metoprolol tartrate (LOPRESSOR) 25 MG tablet Take 1 tablet (25 mg total) by mouth 2 (two) times daily.   potassium chloride SA (KLOR-CON M) 20 MEQ tablet Take 1 tablet (20 mEq total) by mouth daily.   simvastatin (ZOCOR) 20 MG tablet Take 20 mg by mouth daily.   traZODone (DESYREL) 100 MG tablet 1 tablet as needed at bedtime Orally Once a day for 30 days     No Known Allergies  Social History   Socioeconomic History   Marital status: Married    Spouse name: [name]   Number of children: 2   Years of education: Not on file   Highest education level: Not on file  Occupational History   Occupation: Retired    Comment: Writer  Tobacco Use   Smoking status: Never   Smokeless tobacco: Never  Vaping Use   Vaping Use: Never used  Substance and Sexual Activity   Alcohol use: No   Drug use: No   Sexual activity: Not Currently  Other Topics Concern   Not on file  Social History Narrative   Married, wife [name]   Retired Environmental manager for 52 years   Independent in [place]   #2 grown sons   Social Determinants of Radio broadcast assistant Strain: Not on [employer] Insecurity: Not on file  Transportation Needs: Not on file  Physical Activity: Not on file  Stress: Not on file  Social Connections: Not on file  Intimate Partner Violence: Not on file     Review of Systems: General: negative for chills, fever, night sweats or weight changes.  Cardiovascular: negative for chest pain, dyspnea on exertion, edema, orthopnea, palpitations, paroxysmal nocturnal dyspnea or shortness of breath Dermatological: negative for rash Respiratory: negative for cough or wheezing Urologic: negative for hematuria Abdominal:  negative for nausea, vomiting, diarrhea, bright red blood per rectum, melena, or hematemesis Neurologic: negative for visual changes, syncope, or dizziness All other systems reviewed and are otherwise negative except as noted above.    Blood pressure (!) 152/60, pulse 60, height 5\' 7"  (1.702 m), weight 156 lb (70.8 kg), SpO2 98 %.  General appearance: alert and no distress Neck: no adenopathy, no carotid bruit, no JVD, supple, symmetrical, trachea midline, and thyroid not enlarged, symmetric, no tenderness/mass/nodules Lungs: clear to auscultation bilaterally Heart: regular rate and rhythm, S1, S2 normal, no murmur, click, rub or gallop Extremities: Trace lower extremity edema Pulses: 2+ and symmetric Skin: Skin color, texture, turgor normal. No rashes or lesions Neurologic: Grossly normal  EKG not performed today  ASSESSMENT AND PLAN:   Hypertension History of essential hypertension blood pressure measured today 152/60.  He is on amlodipine and metoprolol.  Diastolic CHF, acute on chronic (HCC) History of diastolic heart failure on furosemide 40 mg a day with a serum creatinine in the mid 1 range.  Echo performed 01/12/2023 revealed normal LV systolic function with mild to moderate pulmonary hypertension and dilated atria bilaterally.  He has minimal lower extremity edema and denies shortness of breath.  Hyperlipidemia History of hyperlipidemia on Zocor with lipid profile performed 02/21/2023 revealing a total cholesterol 145, LDL of 82 and HDL 47.  CAD in native artery History of CAD status post remote cardiac catheterization revealing occluded LAD with bidirectional collaterals treated medically.  He denies chest pain.     Lorretta Harp MD FACP,FACC,FAHA, [facility] 03/07/2023 2:35 PM

## 2023-03-07 NOTE — Assessment & Plan Note (Signed)
History of essential hypertension blood pressure measured today 152/60.  He is on amlodipine and metoprolol.

## 2023-03-07 NOTE — Assessment & Plan Note (Signed)
History of diastolic heart failure on furosemide 40 mg a day with a serum creatinine in the mid 1 range.  Echo performed 01/12/2023 revealed normal LV systolic function with mild to moderate pulmonary hypertension and dilated atria bilaterally.  He has minimal lower extremity edema and denies shortness of breath.

## 2023-03-07 NOTE — Assessment & Plan Note (Signed)
History of CAD status post remote cardiac catheterization revealing occluded LAD with bidirectional collaterals treated medically.  He denies chest pain.

## 2023-03-22 ENCOUNTER — Other Ambulatory Visit: Payer: PRIVATE HEALTH INSURANCE

## 2023-03-22 ENCOUNTER — Ambulatory Visit: Payer: PRIVATE HEALTH INSURANCE

## 2023-03-22 ENCOUNTER — Ambulatory Visit: Payer: PRIVATE HEALTH INSURANCE | Admitting: Oncology

## 2023-03-25 ENCOUNTER — Encounter: Payer: Self-pay | Admitting: Oncology

## 2023-03-27 ENCOUNTER — Encounter: Payer: Self-pay | Admitting: Oncology

## 2023-03-27 ENCOUNTER — Inpatient Hospital Stay (HOSPITAL_BASED_OUTPATIENT_CLINIC_OR_DEPARTMENT_OTHER): Payer: Medicare Other | Admitting: Oncology

## 2023-03-27 ENCOUNTER — Inpatient Hospital Stay: Payer: Medicare Other | Attending: Nurse Practitioner

## 2023-03-27 ENCOUNTER — Inpatient Hospital Stay: Payer: Medicare Other

## 2023-03-27 VITALS — BP 152/57 | HR 61 | Temp 98.2°F | Resp 18 | Ht 67.0 in | Wt 156.3 lb

## 2023-03-27 DIAGNOSIS — C49A3 Gastrointestinal stromal tumor of small intestine: Secondary | ICD-10-CM | POA: Insufficient documentation

## 2023-03-27 DIAGNOSIS — E538 Deficiency of other specified B group vitamins: Secondary | ICD-10-CM | POA: Insufficient documentation

## 2023-03-27 DIAGNOSIS — N289 Disorder of kidney and ureter, unspecified: Secondary | ICD-10-CM | POA: Diagnosis not present

## 2023-03-27 DIAGNOSIS — D649 Anemia, unspecified: Secondary | ICD-10-CM | POA: Insufficient documentation

## 2023-03-27 DIAGNOSIS — N183 Chronic kidney disease, stage 3 unspecified: Secondary | ICD-10-CM | POA: Diagnosis present

## 2023-03-27 LAB — CMP (CANCER CENTER ONLY)
ALT: 21 U/L (ref 0–44)
AST: 23 U/L (ref 15–41)
Albumin: 4 g/dL (ref 3.5–5.0)
Alkaline Phosphatase: 74 U/L (ref 38–126)
Anion gap: 7 (ref 5–15)
BUN: 30 mg/dL — ABNORMAL HIGH (ref 8–23)
CO2: 30 mmol/L (ref 22–32)
Calcium: 8.9 mg/dL (ref 8.9–10.3)
Chloride: 103 mmol/L (ref 98–111)
Creatinine: 1.77 mg/dL — ABNORMAL HIGH (ref 0.61–1.24)
GFR, Estimated: 36 mL/min — ABNORMAL LOW (ref >60)
Glucose, Bld: 117 mg/dL — ABNORMAL HIGH (ref 70–99)
Potassium: 3.9 mmol/L (ref 3.5–5.1)
Sodium: 140 mmol/L (ref 135–145)
Total Bilirubin: 0.5 mg/dL (ref 0.3–1.2)
Total Protein: 6.4 g/dL — ABNORMAL LOW (ref 6.5–8.1)

## 2023-03-27 LAB — CBC WITH DIFFERENTIAL (CANCER CENTER ONLY)
Abs Immature Granulocytes: 0.01 10*3/uL (ref 0.00–0.07)
Basophils Absolute: 0 10*3/uL (ref 0.0–0.1)
Basophils Relative: 1 %
Eosinophils Absolute: 1.7 10*3/uL — ABNORMAL HIGH (ref 0.0–0.5)
Eosinophils Relative: 26 %
HCT: 33 % — ABNORMAL LOW (ref 39.0–52.0)
Hemoglobin: 11 g/dL — ABNORMAL LOW (ref 13.0–17.0)
Immature Granulocytes: 0 %
Lymphocytes Relative: 14 %
Lymphs Abs: 0.9 10*3/uL (ref 0.7–4.0)
MCH: 34.4 pg — ABNORMAL HIGH (ref 26.0–34.0)
MCHC: 33.3 g/dL (ref 30.0–36.0)
MCV: 103.1 fL — ABNORMAL HIGH (ref 80.0–100.0)
Monocytes Absolute: 0.6 10*3/uL (ref 0.1–1.0)
Monocytes Relative: 9 %
Neutro Abs: 3.2 10*3/uL (ref 1.7–7.7)
Neutrophils Relative %: 50 %
Platelet Count: 174 10*3/uL (ref 150–400)
RBC: 3.2 MIL/uL — ABNORMAL LOW (ref 4.22–5.81)
RDW: 12.1 % (ref 11.5–15.5)
WBC Count: 6.4 10*3/uL (ref 4.0–10.5)
nRBC: 0 % (ref 0.0–0.2)

## 2023-03-27 NOTE — Progress Notes (Signed)
[name] OFFICE PROGRESS NOTE   Diagnosis: Gastrointestinal stromal tumor, anemia, renal insufficiency  INTERVAL HISTORY:   [name] [name] returns as scheduled.  He continues Gleevec.  No dyspnea.  Lower extremity edema has improved.  He is not bothered by the inguinal hernia.  He last received darbepoetin on 02/22/2023.  Objective:  Vital signs in last 24 hours:  Blood pressure (!) 152/57, pulse 61, temperature 98.2 F (36.8 C), temperature source Oral, resp. rate 18, height 5\' 7"  (1.702 m), weight 156 lb 4.8 oz (70.9 kg), SpO2 98 %.    HEENT: No thrush or ulcers Resp: Decreased breath sounds at the lower right greater than left chest with dullness to percussion Cardio: Regular rate and rhythm GI: No mass, nontender, no hepatosplenomegaly, soft right greater than left inguinal hernias Vascular: No leg edema, the right lower leg is slightly larger than the left side  Lab Results:  Lab Results  Component Value Date   WBC 6.4 03/27/2023   HGB 11.0 (L) 03/27/2023   HCT 33.0 (L) 03/27/2023   MCV 103.1 (H) 03/27/2023   PLT 174 03/27/2023   NEUTROABS 3.2 03/27/2023    CMP  Lab Results  Component Value Date   NA 140 03/27/2023   K 3.9 03/27/2023   CL 103 03/27/2023   CO2 30 03/27/2023   GLUCOSE 117 (H) 03/27/2023   BUN 30 (H) 03/27/2023   CREATININE 1.77 (H) 03/27/2023   CALCIUM 8.9 03/27/2023   PROT 6.4 (L) 03/27/2023   ALBUMIN 4.0 03/27/2023   AST 23 03/27/2023   ALT 21 03/27/2023   ALKPHOS 74 03/27/2023   BILITOT 0.5 03/27/2023   GFRNONAA 36 (L) 03/27/2023   GFRAA 51 (L) 08/05/2020    No results found for: "CEA1", "CEA", "CAN199", "CA125"  Lab Results  Component Value Date   INR 1.17 07/26/2015   LABPROT 15.1 07/26/2015    Imaging:  No results found.  Medications: I have reviewed the patient's current medications.   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 CT 10/16/2014  consistent with extensive carcinomatosis, CT-guided biopsy of an omental mass 10/21/2014 confirmed a gastrointestinal stromal tumor Initiation of Gleevec 10/31/2014 CT 01/13/2015 revealed improvement in the peritoneal and omental metastatic disease CT 05/05/2015 with no progression of omental/peritoneal metastatic disease, increased ascites, and appendix inflammation CT 07/07/2016-no abscess, mild perihepatic and left pericolic ascites and mesenteric edema CT of the pelvis 09/20/2016, no residual abscess, no ascites CT abdomen pelvis renal stones study 04/03/2017-partial small bowel obstruction, potentially related to right inguinal hernia CT abdomen/pelvis 08/18/2017-possible cholecystitis, 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  Cholelithiasis. CT renal stone study 06/04/2021-large right and small left inguinal hernias, no ascites CTs 11/22/2022-small bilateral pleural effusions small volume ascites, gallstones, large right inguinal hernia, borderline enlarged mediastinal nodes Gleevec 400 mg every other day beginning 12/22/2022 due to edema and pleural effusions     2. History of anemia, status post a nondiagnostic bone marrow biopsy 10/21/2014   3. NSTEMI March 2014   4. Admission 05/06/2015 with acute onset right abdomen pain-potentially related to acute appendicitis versus pain from carcinomatosis CT 05/13/2015 consistent with a right lower abdomen abscess, status post catheter drainage by interventional radiology 05/13/2015  Follow-up CT 05/19/2015 showed resolution of the abscess. Follow-up evaluation in interventional radiology 06/01/2015 showed the  abscess cavity had resolved. The abscess drainage catheter communicated with the cecum via the appendix. The catheter was left in place. CT abdomen/pelvis 06/08/2015 showed the right pelvic drain in place without recurrent or residual surrounding fluid collection. Peritoneal metastasis with slight increase in small to moderate volume of abdominal pelvic ascites. Drainage catheter injection 06/12/2015 showed residual tiny fistulous connection with the end of the decompressed abscess cavity within the right lower abdominal quadrant and the residual lumen of the appendix. Paracentesis 06/12/2015 with 5 liters of fluid removed Paracentesis 06/19/2015-negative cytology and culture Removal of abscess drainage catheter 06/23/2015 CT 07/26/2015 with increased right abdominal wall inflammation and increased fluid collection at site of previous right abdomen abscess, ascites, and enlargement/inflammation of the appendix Placement of right lower quadrant percutaneous drainage catheter 07/27/2015. Contrast injection confirmed persistent fistulous connection with the ill-defined fluid collection and the residual appendix and cecum. Paracentesis with 2 L of fluid removed. Follow-up CT abdomen/pelvis 08/05/2015 with small to moderate left pleural effusion and moderate to large volume ascites with both appearing slightly decreased since the prior study. Right lower quadrant drainage catheter in place. No residual fluid collection around the drain or in the adjacent abdomen or pelvis. 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 Drainage catheter left in place. Tube evaluation 02/10/2016, persistent fistula Tube evaluation 03/01/2016, continued fistula to the cecum Tube downsized 05/04/2016, no residual abscess seen Collapsed abscess with a stable fistula to the cecum on imaging 06/16/2016 Tube removed 07/26/2016 Recurrent drainage from the right lower quadrant tube site  August 2018, status post antibiotics with improvement, drainage again November 2018-antibiotic prescribed by Dr. Derrell [name]     5. C. difficile colitis 05/10/2015, [name] [name] 2019   6.  Right leg edema 12/18/2015. Negative venous Doppler.   7.  Renal insufficiency   8.  Urinary retention June 2022-temporary Foley catheter placed, started on Flomax   9.  B12 deficiency-oral B12 initiated 12/15/2021   10.  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  He continues to tolerate Gleevec well.  He will continue Gleevec at the current dose.  There is no clinical evidence for progression of the gastrointestinal stromal tumor. The hemoglobin is in goal range today.  Report and will be held today.  [name] [name] will return for an office visit in 4 weeks.   Thornton Papas, MD  03/27/2023  3:29 PM

## 2023-04-12 DIAGNOSIS — H40013 Open angle with borderline findings, low risk, bilateral: Secondary | ICD-10-CM | POA: Diagnosis not present

## 2023-04-26 ENCOUNTER — Inpatient Hospital Stay (HOSPITAL_BASED_OUTPATIENT_CLINIC_OR_DEPARTMENT_OTHER): Payer: Medicare Other | Admitting: Oncology

## 2023-04-26 ENCOUNTER — Inpatient Hospital Stay: Payer: Medicare Other | Attending: Nurse Practitioner

## 2023-04-26 VITALS — BP 153/66 | HR 80 | Temp 98.2°F | Resp 20 | Ht 67.0 in | Wt 150.5 lb

## 2023-04-26 DIAGNOSIS — N289 Disorder of kidney and ureter, unspecified: Secondary | ICD-10-CM | POA: Diagnosis not present

## 2023-04-26 DIAGNOSIS — D638 Anemia in other chronic diseases classified elsewhere: Secondary | ICD-10-CM | POA: Diagnosis not present

## 2023-04-26 DIAGNOSIS — E538 Deficiency of other specified B group vitamins: Secondary | ICD-10-CM | POA: Diagnosis not present

## 2023-04-26 DIAGNOSIS — C49A3 Gastrointestinal stromal tumor of small intestine: Secondary | ICD-10-CM | POA: Insufficient documentation

## 2023-04-26 LAB — CBC WITH DIFFERENTIAL (CANCER CENTER ONLY)
Abs Immature Granulocytes: 0.02 10*3/uL (ref 0.00–0.07)
Basophils Absolute: 0 10*3/uL (ref 0.0–0.1)
Basophils Relative: 1 %
Eosinophils Absolute: 0.5 10*3/uL (ref 0.0–0.5)
Eosinophils Relative: 10 %
HCT: 31.9 % — ABNORMAL LOW (ref 39.0–52.0)
Hemoglobin: 10.8 g/dL — ABNORMAL LOW (ref 13.0–17.0)
Immature Granulocytes: 0 %
Lymphocytes Relative: 13 %
Lymphs Abs: 0.7 10*3/uL (ref 0.7–4.0)
MCH: 33.6 pg (ref 26.0–34.0)
MCHC: 33.9 g/dL (ref 30.0–36.0)
MCV: 99.4 fL (ref 80.0–100.0)
Monocytes Absolute: 0.6 10*3/uL (ref 0.1–1.0)
Monocytes Relative: 11 %
Neutro Abs: 3.5 10*3/uL (ref 1.7–7.7)
Neutrophils Relative %: 65 %
Platelet Count: 211 10*3/uL (ref 150–400)
RBC: 3.21 MIL/uL — ABNORMAL LOW (ref 4.22–5.81)
RDW: 12.6 % (ref 11.5–15.5)
WBC Count: 5.3 10*3/uL (ref 4.0–10.5)
nRBC: 0 % (ref 0.0–0.2)

## 2023-04-26 LAB — CMP (CANCER CENTER ONLY)
ALT: 18 U/L (ref 0–44)
AST: 24 U/L (ref 15–41)
Albumin: 4.1 g/dL (ref 3.5–5.0)
Alkaline Phosphatase: 55 U/L (ref 38–126)
Anion gap: 8 (ref 5–15)
BUN: 38 mg/dL — ABNORMAL HIGH (ref 8–23)
CO2: 29 mmol/L (ref 22–32)
Calcium: 9 mg/dL (ref 8.9–10.3)
Chloride: 103 mmol/L (ref 98–111)
Creatinine: 2.1 mg/dL — ABNORMAL HIGH (ref 0.61–1.24)
GFR, Estimated: 29 mL/min — ABNORMAL LOW (ref >60)
Glucose, Bld: 104 mg/dL — ABNORMAL HIGH (ref 70–99)
Potassium: 4.1 mmol/L (ref 3.5–5.1)
Sodium: 140 mmol/L (ref 135–145)
Total Bilirubin: 0.5 mg/dL (ref 0.3–1.2)
Total Protein: 7.1 g/dL (ref 6.5–8.1)

## 2023-04-26 NOTE — Progress Notes (Signed)
[facility] OFFICE PROGRESS NOTE   Diagnosis: Gastrointestinal stromal tumor  INTERVAL HISTORY:   Mr. Alvi returns as scheduled.  He continues Gleevec.  He has noted improvement in leg edema since the furosemide dose was increased to 40 mg.  He has to urinate shortly after taking furosemide.  Good appetite.  He continues to work around his home.  He reports right greater than left ankle pain after working.  The pain is relieved with Tylenol.  No pain at rest.  Objective:  Vital signs in last 24 hours:  Blood pressure (!) 153/66, pulse 80, temperature 98.2 F (36.8 C), temperature source Oral, resp. rate 20, height 5\' 7"  (1.702 m), weight 150 lb 8 oz (68.3 kg), SpO2 100 %.    HEENT: Mild white coat over the tongue, no ulcers Resp: Lungs clear bilaterally Cardio: Giller rate and rhythm GI: No hepatomegaly, no mass, nontender, soft right inguinal hernia Vascular: Trace right greater than left lower leg edema Musculoskeletal: Slight decrease in flexion/extension at the right compared to the left ankle.  No erythema, edema, or tenderness   Lab Results:  Lab Results  Component Value Date   WBC 5.3 04/26/2023   HGB 10.8 (L) 04/26/2023   HCT 31.9 (L) 04/26/2023   MCV 99.4 04/26/2023   PLT 211 04/26/2023   NEUTROABS 3.5 04/26/2023    CMP  Lab Results  Component Value Date   NA 140 04/26/2023   K 4.1 04/26/2023   CL 103 04/26/2023   CO2 29 04/26/2023   GLUCOSE 104 (H) 04/26/2023   BUN 38 (H) 04/26/2023   CREATININE 2.10 (H) 04/26/2023   CALCIUM 9.0 04/26/2023   PROT 7.1 04/26/2023   ALBUMIN 4.1 04/26/2023   AST 24 04/26/2023   ALT 18 04/26/2023   ALKPHOS 55 04/26/2023   BILITOT 0.5 04/26/2023   GFRNONAA 29 (L) 04/26/2023   GFRAA 51 (L) 08/05/2020    Medications: I have reviewed the patient's current medications.   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 CT 10/16/2014 consistent with  extensive carcinomatosis, CT-guided biopsy of an omental mass 10/21/2014 confirmed a gastrointestinal stromal tumor Initiation of Gleevec 10/31/2014 CT 01/13/2015 revealed improvement in the peritoneal and omental metastatic disease CT 05/05/2015 with no progression of omental/peritoneal metastatic disease, increased ascites, and appendix inflammation CT 07/07/2016-no abscess, mild perihepatic and left pericolic ascites and mesenteric edema CT of the pelvis 09/20/2016, no residual abscess, no ascites CT abdomen pelvis renal stones study 04/03/2017-partial small bowel obstruction, potentially related to right inguinal hernia CT abdomen/pelvis 08/18/2017-possible cholecystitis, 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  Cholelithiasis. CT renal stone study 06/04/2021-large right and small left inguinal hernias, no ascites CTs 11/22/2022-small bilateral pleural effusions small volume ascites, gallstones, large right inguinal hernia, borderline enlarged mediastinal nodes Gleevec 400 mg every other day beginning 12/22/2022 due to edema and pleural effusions     2. History of anemia, status post a nondiagnostic bone marrow biopsy 10/21/2014   3. NSTEMI March 2014   4. Admission 05/06/2015 with acute onset right abdomen pain-potentially related to acute appendicitis versus pain from carcinomatosis CT 05/13/2015 consistent with a right lower abdomen abscess, status post catheter drainage by interventional radiology 05/13/2015 Follow-up  CT 05/19/2015 showed resolution of the abscess. Follow-up evaluation in interventional radiology 06/01/2015 showed the abscess cavity  had resolved. The abscess drainage catheter communicated with the cecum via the appendix. The catheter was left in place. CT abdomen/pelvis 06/08/2015 showed the right pelvic drain in place without recurrent or residual surrounding fluid collection. Peritoneal metastasis with slight increase in small to moderate volume of abdominal pelvic ascites. Drainage catheter injection 06/12/2015 showed residual tiny fistulous connection with the end of the decompressed abscess cavity within the right lower abdominal quadrant and the residual lumen of the appendix. Paracentesis 06/12/2015 with 5 liters of fluid removed Paracentesis 06/19/2015-negative cytology and culture Removal of abscess drainage catheter 06/23/2015 CT 07/26/2015 with increased right abdominal wall inflammation and increased fluid collection at site of previous right abdomen abscess, ascites, and enlargement/inflammation of the appendix Placement of right lower quadrant percutaneous drainage catheter 07/27/2015. Contrast injection confirmed persistent fistulous connection with the ill-defined fluid collection and the residual appendix and cecum. Paracentesis with 2 L of fluid removed. Follow-up CT abdomen/pelvis 08/05/2015 with small to moderate left pleural effusion and moderate to large volume ascites with both appearing slightly decreased since the prior study. Right lower quadrant drainage catheter in place. No residual fluid collection around the drain or in the adjacent abdomen or pelvis. 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 Drainage catheter left in place. 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 Derrell Lolling     5. C. difficile colitis 05/10/2015, [name] 2019   6.  Right leg edema 12/18/2015. Negative venous Doppler.   7.  Renal insufficiency   8.  Urinary retention June 2022-temporary Foley catheter placed, started on Flomax   9.  B12 deficiency-oral B12 initiated 12/15/2021   10.  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  He continues Gleevec.  There is no clinical evidence of disease progression.  The right ankle discomfort is likely related to a benign condition such as arthritis of the ankle joint.  He will continue Tylenol as needed.  The creatinine is higher today.  This is likely secondary to the increased dose of furosemide.  Mr. Jacko last received Aranesp on 02/22/2023.  Aranesp will remain on hold for hemoglobin greater than 10.  Mr. May will return for an office and lab visit in approximately 5 weeks.  Thornton Papas, MD  04/26/2023  11:10 AM

## 2023-05-31 ENCOUNTER — Inpatient Hospital Stay: Payer: Medicare Other | Attending: Nurse Practitioner

## 2023-05-31 ENCOUNTER — Inpatient Hospital Stay (HOSPITAL_BASED_OUTPATIENT_CLINIC_OR_DEPARTMENT_OTHER): Payer: Medicare Other | Admitting: Oncology

## 2023-05-31 VITALS — BP 142/58 | HR 60 | Temp 98.1°F | Resp 18 | Ht 67.0 in | Wt 157.8 lb

## 2023-05-31 DIAGNOSIS — C49A3 Gastrointestinal stromal tumor of small intestine: Secondary | ICD-10-CM | POA: Diagnosis not present

## 2023-05-31 DIAGNOSIS — I252 Old myocardial infarction: Secondary | ICD-10-CM | POA: Diagnosis not present

## 2023-05-31 DIAGNOSIS — E538 Deficiency of other specified B group vitamins: Secondary | ICD-10-CM | POA: Insufficient documentation

## 2023-05-31 DIAGNOSIS — D638 Anemia in other chronic diseases classified elsewhere: Secondary | ICD-10-CM | POA: Insufficient documentation

## 2023-05-31 DIAGNOSIS — N289 Disorder of kidney and ureter, unspecified: Secondary | ICD-10-CM | POA: Diagnosis not present

## 2023-05-31 LAB — CMP (CANCER CENTER ONLY)
ALT: 19 U/L (ref 0–44)
AST: 21 U/L (ref 15–41)
Albumin: 3.8 g/dL (ref 3.5–5.0)
Alkaline Phosphatase: 57 U/L (ref 38–126)
Anion gap: 8 (ref 5–15)
BUN: 30 mg/dL — ABNORMAL HIGH (ref 8–23)
CO2: 27 mmol/L (ref 22–32)
Calcium: 8.6 mg/dL — ABNORMAL LOW (ref 8.9–10.3)
Chloride: 105 mmol/L (ref 98–111)
Creatinine: 1.83 mg/dL — ABNORMAL HIGH (ref 0.61–1.24)
GFR, Estimated: 34 mL/min — ABNORMAL LOW (ref >60)
Glucose, Bld: 165 mg/dL — ABNORMAL HIGH (ref 70–99)
Potassium: 4.1 mmol/L (ref 3.5–5.1)
Sodium: 140 mmol/L (ref 135–145)
Total Bilirubin: 0.7 mg/dL (ref 0.3–1.2)
Total Protein: 6.3 g/dL — ABNORMAL LOW (ref 6.5–8.1)

## 2023-05-31 LAB — CBC WITH DIFFERENTIAL (CANCER CENTER ONLY)
Abs Immature Granulocytes: 0.01 10*3/uL (ref 0.00–0.07)
Basophils Absolute: 0 10*3/uL (ref 0.0–0.1)
Basophils Relative: 1 %
Eosinophils Absolute: 0.5 10*3/uL (ref 0.0–0.5)
Eosinophils Relative: 9 %
HCT: 30.8 % — ABNORMAL LOW (ref 39.0–52.0)
Hemoglobin: 10.2 g/dL — ABNORMAL LOW (ref 13.0–17.0)
Immature Granulocytes: 0 %
Lymphocytes Relative: 14 %
Lymphs Abs: 0.7 10*3/uL (ref 0.7–4.0)
MCH: 33.3 pg (ref 26.0–34.0)
MCHC: 33.1 g/dL (ref 30.0–36.0)
MCV: 100.7 fL — ABNORMAL HIGH (ref 80.0–100.0)
Monocytes Absolute: 0.4 10*3/uL (ref 0.1–1.0)
Monocytes Relative: 8 %
Neutro Abs: 3.7 10*3/uL (ref 1.7–7.7)
Neutrophils Relative %: 68 %
Platelet Count: 191 10*3/uL (ref 150–400)
RBC: 3.06 MIL/uL — ABNORMAL LOW (ref 4.22–5.81)
RDW: 14.5 % (ref 11.5–15.5)
WBC Count: 5.4 10*3/uL (ref 4.0–10.5)
nRBC: 0 % (ref 0.0–0.2)

## 2023-05-31 NOTE — Progress Notes (Signed)
[facility] OFFICE PROGRESS NOTE   Diagnosis: Gastrointestinal stromal tumor  INTERVAL HISTORY:   [name] returns as scheduled.  He feels well.  He continues Gleevec.  No complaint.  Objective:  Vital signs in last 24 hours:  Blood pressure (!) 142/58, pulse 60, temperature 98.1 F (36.7 C), temperature source Oral, resp. rate 18, height 5\' 7"  (1.702 m), weight 157 lb 12.8 oz (71.6 kg), SpO2 100 %.    HEENT: No thrush or ulcers Resp: Lungs clear bilaterally Cardio: Regular rate and rhythm GI: No mass, no hepatosplenomegaly, no apparent ascites Vascular: No leg edema   Lab Results:  Lab Results  Component Value Date   WBC 5.4 05/31/2023   HGB 10.2 (L) 05/31/2023   HCT 30.8 (L) 05/31/2023   MCV 100.7 (H) 05/31/2023   PLT 191 05/31/2023   NEUTROABS 3.7 05/31/2023    CMP  Lab Results  Component Value Date   NA 140 05/31/2023   K 4.1 05/31/2023   CL 105 05/31/2023   CO2 27 05/31/2023   GLUCOSE 165 (H) 05/31/2023   BUN 30 (H) 05/31/2023   CREATININE 1.83 (H) 05/31/2023   CALCIUM 8.6 (L) 05/31/2023   PROT 6.3 (L) 05/31/2023   ALBUMIN 3.8 05/31/2023   AST 21 05/31/2023   ALT 19 05/31/2023   ALKPHOS 57 05/31/2023   BILITOT 0.7 05/31/2023   GFRNONAA 34 (L) 05/31/2023   GFRAA 51 (L) 08/05/2020    No results found for: "CEA1", "CEA", "CAN199", "CA125"  Lab Results  Component Value Date   INR 1.17 07/26/2015   LABPROT 15.1 07/26/2015    Imaging:  No results found.  Medications: I have reviewed the patient's current medications.   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 CT 10/16/2014 consistent with extensive carcinomatosis, CT-guided biopsy of an omental mass 10/21/2014 confirmed a gastrointestinal stromal tumor Initiation of Gleevec 10/31/2014 CT 01/13/2015 revealed improvement in the peritoneal and omental metastatic disease CT 05/05/2015 with no progression of  omental/peritoneal metastatic disease, increased ascites, and appendix inflammation CT 07/07/2016-no abscess, mild perihepatic and left pericolic ascites and mesenteric edema CT of the pelvis 09/20/2016, no residual abscess, no ascites CT abdomen pelvis renal stones study 04/03/2017-partial small bowel obstruction, potentially related to right inguinal hernia CT abdomen/pelvis 08/18/2017-possible cholecystitis, 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  Cholelithiasis. CT renal stone study 06/04/2021-large right and small left inguinal hernias, no ascites CTs 11/22/2022-small bilateral pleural effusions small volume ascites, gallstones, large right inguinal hernia, borderline enlarged mediastinal nodes Gleevec 400 mg every other day beginning 12/22/2022 due to edema and pleural effusions     2. History of anemia, status post a nondiagnostic bone marrow biopsy 10/21/2014   3. NSTEMI March 2014   4. Admission 05/06/2015 with acute onset right abdomen pain-potentially related to acute appendicitis versus pain from carcinomatosis CT 05/13/2015 consistent with a right lower abdomen abscess, status post catheter drainage by interventional radiology 05/13/2015 Follow-up CT 05/19/2015 showed resolution of the abscess. Follow-up evaluation in interventional radiology 06/01/2015 showed the abscess cavity had resolved. The abscess drainage catheter communicated with the cecum via the appendix. The catheter was left in place. CT abdomen/pelvis 06/08/2015 showed the right pelvic drain in place  without recurrent or residual surrounding fluid collection. Peritoneal metastasis  with slight increase in small to moderate volume of abdominal pelvic ascites. Drainage catheter injection 06/12/2015 showed residual tiny fistulous connection with the end of the decompressed abscess cavity within the right lower abdominal quadrant and the residual lumen of the appendix. Paracentesis 06/12/2015 with 5 liters of fluid removed Paracentesis 06/19/2015-negative cytology and culture Removal of abscess drainage catheter 06/23/2015 CT 07/26/2015 with increased right abdominal wall inflammation and increased fluid collection at site of previous right abdomen abscess, ascites, and enlargement/inflammation of the appendix Placement of right lower quadrant percutaneous drainage catheter 07/27/2015. Contrast injection confirmed persistent fistulous connection with the ill-defined fluid collection and the residual appendix and cecum. Paracentesis with 2 L of fluid removed. Follow-up CT abdomen/pelvis 08/05/2015 with small to moderate left pleural effusion and moderate to large volume ascites with both appearing slightly decreased since the prior study. Right lower quadrant drainage catheter in place. No residual fluid collection around the drain or in the adjacent abdomen or pelvis. 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 Drainage catheter left in place. 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 Derrell Lolling     5. C. difficile colitis 05/10/2015, [name] 2019   6.  Right leg edema 12/18/2015. Negative venous Doppler.   7.  Renal insufficiency   8.  Urinary retention  June 2022-temporary Foley catheter placed, started on Flomax   9.  B12 deficiency-oral B12 initiated 12/15/2021   10.  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  There is no clinical evidence for progression of the gastrointestinal stromal tumor.  He will continue Gleevec at the current dose.  He has anemia secondary to Gleevec and renal insufficiency.  The hemoglobin has decreased slightly over the past few months.  Aranesp remains on hold.  Mr. Amerman will return for an office and lab visit in 6 weeks.  Thornton Papas, MD  05/31/2023  10:42 AM

## 2023-07-12 ENCOUNTER — Inpatient Hospital Stay (HOSPITAL_BASED_OUTPATIENT_CLINIC_OR_DEPARTMENT_OTHER): Payer: Medicare Other | Admitting: Oncology

## 2023-07-12 ENCOUNTER — Inpatient Hospital Stay: Payer: Medicare Other | Attending: Nurse Practitioner

## 2023-07-12 VITALS — BP 162/68 | HR 61 | Temp 98.2°F | Resp 20 | Ht 67.0 in | Wt 155.6 lb

## 2023-07-12 DIAGNOSIS — N289 Disorder of kidney and ureter, unspecified: Secondary | ICD-10-CM | POA: Diagnosis not present

## 2023-07-12 DIAGNOSIS — E538 Deficiency of other specified B group vitamins: Secondary | ICD-10-CM | POA: Insufficient documentation

## 2023-07-12 DIAGNOSIS — C49A3 Gastrointestinal stromal tumor of small intestine: Secondary | ICD-10-CM | POA: Insufficient documentation

## 2023-07-12 DIAGNOSIS — D638 Anemia in other chronic diseases classified elsewhere: Secondary | ICD-10-CM | POA: Insufficient documentation

## 2023-07-12 DIAGNOSIS — C786 Secondary malignant neoplasm of retroperitoneum and peritoneum: Secondary | ICD-10-CM | POA: Insufficient documentation

## 2023-07-12 DIAGNOSIS — Z8619 Personal history of other infectious and parasitic diseases: Secondary | ICD-10-CM | POA: Diagnosis not present

## 2023-07-12 DIAGNOSIS — I252 Old myocardial infarction: Secondary | ICD-10-CM | POA: Insufficient documentation

## 2023-07-12 LAB — CBC WITH DIFFERENTIAL (CANCER CENTER ONLY)
Abs Immature Granulocytes: 0.01 10*3/uL (ref 0.00–0.07)
Basophils Absolute: 0 10*3/uL (ref 0.0–0.1)
Basophils Relative: 1 %
Eosinophils Absolute: 0.3 10*3/uL (ref 0.0–0.5)
Eosinophils Relative: 6 %
HCT: 32.3 % — ABNORMAL LOW (ref 39.0–52.0)
Hemoglobin: 10.6 g/dL — ABNORMAL LOW (ref 13.0–17.0)
Immature Granulocytes: 0 %
Lymphocytes Relative: 20 %
Lymphs Abs: 0.9 10*3/uL (ref 0.7–4.0)
MCH: 33.7 pg (ref 26.0–34.0)
MCHC: 32.8 g/dL (ref 30.0–36.0)
MCV: 102.5 fL — ABNORMAL HIGH (ref 80.0–100.0)
Monocytes Absolute: 0.5 10*3/uL (ref 0.1–1.0)
Monocytes Relative: 10 %
Neutro Abs: 2.8 10*3/uL (ref 1.7–7.7)
Neutrophils Relative %: 63 %
Platelet Count: 168 10*3/uL (ref 150–400)
RBC: 3.15 MIL/uL — ABNORMAL LOW (ref 4.22–5.81)
RDW: 13.2 % (ref 11.5–15.5)
WBC Count: 4.4 10*3/uL (ref 4.0–10.5)
nRBC: 0 % (ref 0.0–0.2)

## 2023-07-12 LAB — CMP (CANCER CENTER ONLY)
ALT: 19 U/L (ref 0–44)
AST: 22 U/L (ref 15–41)
Albumin: 4 g/dL (ref 3.5–5.0)
Alkaline Phosphatase: 61 U/L (ref 38–126)
Anion gap: 6 (ref 5–15)
BUN: 21 mg/dL (ref 8–23)
CO2: 27 mmol/L (ref 22–32)
Calcium: 8.7 mg/dL — ABNORMAL LOW (ref 8.9–10.3)
Chloride: 109 mmol/L (ref 98–111)
Creatinine: 1.66 mg/dL — ABNORMAL HIGH (ref 0.61–1.24)
GFR, Estimated: 38 mL/min — ABNORMAL LOW (ref >60)
Glucose, Bld: 100 mg/dL — ABNORMAL HIGH (ref 70–99)
Potassium: 4 mmol/L (ref 3.5–5.1)
Sodium: 142 mmol/L (ref 135–145)
Total Bilirubin: 0.6 mg/dL (ref 0.3–1.2)
Total Protein: 6.6 g/dL (ref 6.5–8.1)

## 2023-07-12 NOTE — Progress Notes (Signed)
[name] [name] reports he has not taken his morning BP pill yet.

## 2023-07-12 NOTE — Progress Notes (Signed)
[facility] OFFICE PROGRESS NOTE   Diagnosis: Gastrointestinal stromal tumor  INTERVAL HISTORY:   [name] [name] returns as scheduled.  He continues Gleevec.  No rash or diarrhea.  No complaint.  Objective:  Vital signs in last 24 hours:  Blood pressure (!) 175/65, pulse 61, temperature 98.2 F (36.8 C), temperature source Oral, resp. rate 20, height 5\' 7"  (1.702 m), weight 155 lb 9.6 oz (70.6 kg), SpO2 98%.    HEENT: No thrush or ulcers Resp: Lungs clear bilaterally Cardio: Regular rate and rhythm GI: No hepatosplenomegaly, no mass, nontender, nonreducible right inguinal hernia Vascular: No leg edema   Lab Results:  Lab Results  Component Value Date   WBC 4.4 07/12/2023   HGB 10.6 (L) 07/12/2023   HCT 32.3 (L) 07/12/2023   MCV 102.5 (H) 07/12/2023   PLT 168 07/12/2023   NEUTROABS 2.8 07/12/2023    CMP  Lab Results  Component Value Date   NA 140 05/31/2023   K 4.1 05/31/2023   CL 105 05/31/2023   CO2 27 05/31/2023   GLUCOSE 165 (H) 05/31/2023   BUN 30 (H) 05/31/2023   CREATININE 1.83 (H) 05/31/2023   CALCIUM 8.6 (L) 05/31/2023   PROT 6.3 (L) 05/31/2023   ALBUMIN 3.8 05/31/2023   AST 21 05/31/2023   ALT 19 05/31/2023   ALKPHOS 57 05/31/2023   BILITOT 0.7 05/31/2023   GFRNONAA 34 (L) 05/31/2023   GFRAA 51 (L) 08/05/2020    Medications: I have reviewed the patient's current medications.   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 CT 10/16/2014 consistent with extensive carcinomatosis, CT-guided biopsy of an omental mass 10/21/2014 confirmed a gastrointestinal stromal tumor Initiation of Gleevec 10/31/2014 CT 01/13/2015 revealed improvement in the peritoneal and omental metastatic disease CT 05/05/2015 with no progression of omental/peritoneal metastatic disease, increased ascites, and appendix inflammation CT 07/07/2016-no abscess, mild perihepatic and left pericolic ascites and  mesenteric edema CT of the pelvis 09/20/2016, no residual abscess, no ascites CT abdomen pelvis renal stones study 04/03/2017-partial small bowel obstruction, potentially related to right inguinal hernia CT abdomen/pelvis 08/18/2017-possible cholecystitis, 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  Cholelithiasis. CT renal stone study 06/04/2021-large right and small left inguinal hernias, no ascites CTs 11/22/2022-small bilateral pleural effusions small volume ascites, gallstones, large right inguinal hernia, borderline enlarged mediastinal nodes Gleevec 400 mg every other day beginning 12/22/2022 due to edema and pleural effusions     2. History of anemia, status post a nondiagnostic bone marrow biopsy 10/21/2014   3. NSTEMI March 2014   4. Admission 05/06/2015 with acute onset right abdomen pain-potentially related to acute appendicitis versus pain from carcinomatosis CT 05/13/2015 consistent with a right lower abdomen abscess, status post catheter drainage by interventional radiology 05/13/2015 Follow-up CT 05/19/2015 showed resolution of the abscess. Follow-up evaluation in interventional radiology 06/01/2015 showed the abscess cavity had resolved. The abscess drainage catheter communicated with the cecum via the appendix. The catheter was left in place. CT abdomen/pelvis 06/08/2015 showed the right pelvic drain in place without recurrent or residual surrounding fluid collection. Peritoneal metastasis with slight increase in small to moderate volume of abdominal pelvic ascites. Drainage catheter injection 06/12/2015 showed residual tiny fistulous connection  with the end of the decompressed abscess cavity within the right lower abdominal quadrant and the residual lumen of the appendix. Paracentesis 06/12/2015 with 5 liters of fluid removed Paracentesis 06/19/2015-negative cytology and culture Removal of abscess drainage catheter 06/23/2015 CT 07/26/2015 with increased right abdominal wall inflammation and increased fluid collection at site of previous right abdomen abscess, ascites, and enlargement/inflammation of the appendix Placement of right lower quadrant percutaneous drainage catheter 07/27/2015. Contrast injection confirmed persistent fistulous connection with the ill-defined fluid collection and the residual appendix and cecum. Paracentesis with 2 L of fluid removed. Follow-up CT abdomen/pelvis 08/05/2015 with small to moderate left pleural effusion and moderate to large volume ascites with both appearing slightly decreased since the prior study. Right lower quadrant drainage catheter in place. No residual fluid collection around the drain or in the adjacent abdomen or pelvis. 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 Drainage catheter left in place. 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 Derrell Lolling     5. C. difficile colitis 05/10/2015, [name] 2019   6.  Right leg edema 12/18/2015. Negative venous Doppler.   7.  Renal insufficiency   8.  Urinary retention June 2022-temporary Foley catheter placed, started on Flomax   9.  B12 deficiency-oral B12 initiated 12/15/2021   10.  Anemia secondary to imatinib,  chronic disease, and renal insufficiency-Aranesp        Disposition: Mr. Drudge appears stable.  There is no clinical evidence for progression of the gastrointestinal stromal tumor.  He is tolerating Gleevec well.  The hemoglobin remains adequate.  He last received Aranesp on 02/22/2023.  Aranesp will remain on hold.  Mr. Pohle will return for an office and lab visit in 2 months.  He will call for new symptoms.  Thornton Papas, MD  07/12/2023  8:30 AM

## 2023-08-23 ENCOUNTER — Other Ambulatory Visit: Payer: Self-pay | Admitting: Nurse Practitioner

## 2023-08-23 DIAGNOSIS — J90 Pleural effusion, not elsewhere classified: Secondary | ICD-10-CM

## 2023-08-23 DIAGNOSIS — C49A3 Gastrointestinal stromal tumor of small intestine: Secondary | ICD-10-CM

## 2023-08-24 DIAGNOSIS — I2510 Atherosclerotic heart disease of native coronary artery without angina pectoris: Secondary | ICD-10-CM | POA: Diagnosis not present

## 2023-08-24 DIAGNOSIS — I5032 Chronic diastolic (congestive) heart failure: Secondary | ICD-10-CM | POA: Diagnosis not present

## 2023-08-24 DIAGNOSIS — N1832 Chronic kidney disease, stage 3b: Secondary | ICD-10-CM | POA: Diagnosis not present

## 2023-08-24 DIAGNOSIS — I700 Atherosclerosis of aorta: Secondary | ICD-10-CM | POA: Diagnosis not present

## 2023-08-24 DIAGNOSIS — G4700 Insomnia, unspecified: Secondary | ICD-10-CM | POA: Diagnosis not present

## 2023-08-24 DIAGNOSIS — E782 Mixed hyperlipidemia: Secondary | ICD-10-CM | POA: Diagnosis not present

## 2023-08-24 DIAGNOSIS — I10 Essential (primary) hypertension: Secondary | ICD-10-CM | POA: Diagnosis not present

## 2023-09-12 ENCOUNTER — Encounter: Payer: Self-pay | Admitting: Nurse Practitioner

## 2023-09-12 ENCOUNTER — Inpatient Hospital Stay (HOSPITAL_BASED_OUTPATIENT_CLINIC_OR_DEPARTMENT_OTHER): Payer: Medicare Other | Admitting: Nurse Practitioner

## 2023-09-12 ENCOUNTER — Inpatient Hospital Stay: Payer: Medicare Other

## 2023-09-12 ENCOUNTER — Inpatient Hospital Stay: Payer: Medicare Other | Attending: Nurse Practitioner

## 2023-09-12 ENCOUNTER — Other Ambulatory Visit: Payer: Self-pay | Admitting: *Deleted

## 2023-09-12 VITALS — BP 175/65 | HR 64 | Temp 97.9°F | Resp 18 | Ht 67.0 in | Wt 153.1 lb

## 2023-09-12 DIAGNOSIS — C49A3 Gastrointestinal stromal tumor of small intestine: Secondary | ICD-10-CM | POA: Diagnosis not present

## 2023-09-12 DIAGNOSIS — C786 Secondary malignant neoplasm of retroperitoneum and peritoneum: Secondary | ICD-10-CM | POA: Insufficient documentation

## 2023-09-12 DIAGNOSIS — Z8619 Personal history of other infectious and parasitic diseases: Secondary | ICD-10-CM | POA: Diagnosis not present

## 2023-09-12 DIAGNOSIS — I252 Old myocardial infarction: Secondary | ICD-10-CM | POA: Diagnosis not present

## 2023-09-12 DIAGNOSIS — D638 Anemia in other chronic diseases classified elsewhere: Secondary | ICD-10-CM | POA: Diagnosis not present

## 2023-09-12 DIAGNOSIS — N289 Disorder of kidney and ureter, unspecified: Secondary | ICD-10-CM | POA: Diagnosis not present

## 2023-09-12 DIAGNOSIS — E538 Deficiency of other specified B group vitamins: Secondary | ICD-10-CM | POA: Diagnosis not present

## 2023-09-12 LAB — CMP (CANCER CENTER ONLY)
ALT: 23 U/L (ref 0–44)
AST: 24 U/L (ref 15–41)
Albumin: 4 g/dL (ref 3.5–5.0)
Alkaline Phosphatase: 66 U/L (ref 38–126)
Anion gap: 7 (ref 5–15)
BUN: 30 mg/dL — ABNORMAL HIGH (ref 8–23)
CO2: 28 mmol/L (ref 22–32)
Calcium: 8.6 mg/dL — ABNORMAL LOW (ref 8.9–10.3)
Chloride: 106 mmol/L (ref 98–111)
Creatinine: 1.96 mg/dL — ABNORMAL HIGH (ref 0.61–1.24)
GFR, Estimated: 31 mL/min — ABNORMAL LOW (ref >60)
Glucose, Bld: 112 mg/dL — ABNORMAL HIGH (ref 70–99)
Potassium: 4.3 mmol/L (ref 3.5–5.1)
Sodium: 141 mmol/L (ref 135–145)
Total Bilirubin: 0.5 mg/dL (ref 0.3–1.2)
Total Protein: 6.7 g/dL (ref 6.5–8.1)

## 2023-09-12 LAB — CBC WITH DIFFERENTIAL (CANCER CENTER ONLY)
Abs Immature Granulocytes: 0.01 10*3/uL (ref 0.00–0.07)
Basophils Absolute: 0 10*3/uL (ref 0.0–0.1)
Basophils Relative: 0 %
Eosinophils Absolute: 0.1 10*3/uL (ref 0.0–0.5)
Eosinophils Relative: 3 %
HCT: 34.9 % — ABNORMAL LOW (ref 39.0–52.0)
Hemoglobin: 11.7 g/dL — ABNORMAL LOW (ref 13.0–17.0)
Immature Granulocytes: 0 %
Lymphocytes Relative: 30 %
Lymphs Abs: 1.4 10*3/uL (ref 0.7–4.0)
MCH: 33.8 pg (ref 26.0–34.0)
MCHC: 33.5 g/dL (ref 30.0–36.0)
MCV: 100.9 fL — ABNORMAL HIGH (ref 80.0–100.0)
Monocytes Absolute: 0.7 10*3/uL (ref 0.1–1.0)
Monocytes Relative: 16 %
Neutro Abs: 2.4 10*3/uL (ref 1.7–7.7)
Neutrophils Relative %: 51 %
Platelet Count: 197 10*3/uL (ref 150–400)
RBC: 3.46 MIL/uL — ABNORMAL LOW (ref 4.22–5.81)
RDW: 12.9 % (ref 11.5–15.5)
WBC Count: 4.7 10*3/uL (ref 4.0–10.5)
nRBC: 0 % (ref 0.0–0.2)

## 2023-09-12 MED ORDER — IMATINIB MESYLATE 400 MG PO TABS: 400.0000 mg | ORAL_TABLET | ORAL | 2 refills | Status: DC

## 2023-09-12 NOTE — Telephone Encounter (Signed)
Per Lonna Cobb, NP: Needs refill on Gleevec 400 mg every other day.

## 2023-09-12 NOTE — Progress Notes (Signed)
[facility] OFFICE PROGRESS NOTE   Diagnosis: Gastrointestinal stromal tumor  INTERVAL HISTORY:   [name] [name] returns as scheduled.  He continues Gleevec.  He feels well.  No rash or diarrhea.  No nausea or vomiting.  He denies abdominal pain.  No significant swelling.  Describes appetite as "pretty good".  Objective:  Vital signs in last 24 hours:  Blood pressure (!) 175/65, pulse 64, temperature 97.9 F (36.6 C), temperature source Temporal, resp. rate 18, height 5\' 7"  (1.702 m), weight 153 lb 1.6 oz (69.4 kg), SpO2 99%.    HEENT: No thrush or ulcers. Resp: Lungs clear bilaterally. Cardio: Regular rate and rhythm. GI: No hepatosplenomegaly.  No mass.  Nontender. Vascular: No significant leg edema. Skin: No rash.   Lab Results:  Lab Results  Component Value Date   WBC 4.7 09/12/2023   HGB 11.7 (L) 09/12/2023   HCT 34.9 (L) 09/12/2023   MCV 100.9 (H) 09/12/2023   PLT 197 09/12/2023   NEUTROABS 2.4 09/12/2023    Imaging:  No results found.  Medications: I have reviewed the patient's current medications.  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 CT 10/16/2014 consistent with extensive carcinomatosis, CT-guided biopsy of an omental mass 10/21/2014 confirmed a gastrointestinal stromal tumor Initiation of Gleevec 10/31/2014 CT 01/13/2015 revealed improvement in the peritoneal and omental metastatic disease CT 05/05/2015 with no progression of omental/peritoneal metastatic disease, increased ascites, and appendix inflammation CT 07/07/2016-no abscess, mild perihepatic and left pericolic ascites and mesenteric edema CT of the pelvis 09/20/2016, no residual abscess, no ascites CT abdomen pelvis renal stones study 04/03/2017-partial small bowel obstruction, potentially related to right inguinal hernia CT abdomen/pelvis 08/18/2017-possible cholecystitis, possible right lower quadrant  enterocutaneous fistula CT 11/19/2018-no evidence of recurrent gastrointestinal stromal tumor, changes from residual of a right lower quadrant fistula, omental edema, right inguinal hernia CT  09/26/2020 -right inguinal herniation of mesenteric fat and small bowel loops without evidence of bowel obstruction or incarceration, unchanged compared to prior CT.  Cholelithiasis. CT renal stone study 06/04/2021-large right and small left inguinal hernias, no ascites CTs 11/22/2022-small bilateral pleural effusions small volume ascites, gallstones, large right inguinal hernia, borderline enlarged mediastinal nodes Gleevec 400 mg every other day beginning 12/22/2022 due to edema and pleural effusions     2. History of anemia, status post a nondiagnostic bone marrow biopsy 10/21/2014   3. NSTEMI March 2014   4. Admission 05/06/2015 with acute onset right abdomen pain-potentially related to acute appendicitis versus pain from carcinomatosis CT 05/13/2015 consistent with a right lower abdomen abscess, status post catheter drainage by interventional radiology 05/13/2015 Follow-up CT 05/19/2015 showed resolution of the abscess. Follow-up evaluation in interventional radiology 06/01/2015 showed the abscess cavity had resolved. The abscess drainage catheter communicated with the cecum via the appendix. The catheter was left in place. CT abdomen/pelvis 06/08/2015 showed the right pelvic drain in place without recurrent or residual surrounding fluid collection. Peritoneal metastasis with slight increase in small to moderate volume of abdominal pelvic ascites. Drainage catheter injection 06/12/2015 showed residual tiny fistulous connection with the end of the decompressed abscess cavity within the right lower abdominal quadrant and the residual lumen of the appendix. Paracentesis 06/12/2015 with 5 liters of fluid removed Paracentesis 06/19/2015-negative cytology and culture Removal of abscess drainage catheter  06/23/2015 CT 07/26/2015 with increased right abdominal wall inflammation and increased fluid collection at site of previous right abdomen abscess, ascites, and enlargement/inflammation of the appendix Placement of right lower quadrant percutaneous  drainage catheter 07/27/2015. Contrast injection confirmed persistent fistulous connection with the ill-defined fluid collection and the residual appendix and cecum. Paracentesis with 2 L of fluid removed. Follow-up CT abdomen/pelvis 08/05/2015 with small to moderate left pleural effusion and moderate to large volume ascites with both appearing slightly decreased since the prior study. Right lower quadrant drainage catheter in place. No residual fluid collection around the drain or in the adjacent abdomen or pelvis. 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 Drainage catheter left in place. 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 Derrell Lolling     5. C. difficile colitis 05/10/2015, [name] 2019   6.  Right leg edema 12/18/2015. Negative venous Doppler.   7.  Renal insufficiency   8.  Urinary retention June 2022-temporary Foley catheter placed, started on Flomax   9.  B12 deficiency-oral B12 initiated 12/15/2021   10.  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 appears stable.  There is no clinical evidence for progression of the gastrointestinal stromal tumor.  He will continue Gleevec.  CBC reviewed.  Hemoglobin is better.  Continue  to hold Aranesp.  He will return for lab and follow-up in 2 months.  He will contact the office in the interim with any problems.    Lonna Cobb ANP/GNP-BC   09/12/2023  9:45 AM

## 2023-09-13 NOTE — Progress Notes (Signed)
Cardiology Clinic Note   Date: 09/15/2023 ID: Guled Von, DOB Apr 24, 1929, MRN [id number]  Primary Cardiologist:  Nanetta Batty, MD  Patient Profile    [name] [name] is a 89 y.o. male who presents to the clinic today for routine follow up.     Past medical history significant for: CAD. LHC 03/05/2013: LM 30%.  Occlusion of LAD after takeoff of D1 with excellent right to left collaterals.  Proximal LCx <20%.  Mid RCA 30 to 40%; it gives excellent collaterals through septal perforators to the LAD. Chronic diastolic heart failure. Echo 01/12/2023: EF 60 to 65%.  Mild concentric LVH.  RV mildly enlarged.  Moderately elevated pulmonary artery systolic pressure.  Severe LAE.  Moderate RAE.  Moderate left lateral pleural effusion.  Trivial AR.  Aortic valve sclerosis without stenosis. Hypertension. Hyperlipidemia. Lipid panel 02/21/2023: LDL 82, HDL 47, TG 79, total 145. CKD stage IIIb.     History of Present Illness    [name] [name] is followed by Dr. Allyson Sabal for the above outlined history.  In summary, patient was followed by cardiology after undergoing LHC during hospital admission March 2014 in which he was found to have a chronically occluded LAD with excellent right to left collaterals.  In September 2015 he underwent hospital admission for acute respiratory failure requiring intubation.  He was seen in the office October 2015 for hospital follow-up.  He complained of lower extremity edema for which he was managing with compression stockings.  He was lost to follow-up until he was evaluated by Dr. Allyson Sabal on 12/15/2022 following the ED visit for volume overload, shortness of breath, DOE.  He was diuresed with IV Lasix in the ED and clinically improved.  Echo January 2024 showed normal LV function, mild LVH/RVH, moderately elevated PA pressure, BAE.  He was last seen in the office by Dr. Allyson Sabal on 03/07/2023 for routine follow-up.  He was doing well at that time and no medication changes were made.  Today,  patient is here alone. He is doing well. Patient denies shortness of breath, dyspnea on exertion, lower extremity edema, orthopnea or PND. No chest pain, pressure, or tightness. No palpitations.  Patient is active walking and lifting weights 4 days a week. He is hypertensive today in the office. He states he was also hypertensive at recent visit to PCP. Home BP typically between 145-165/50-60. He denies headaches, dizziness or vision changes.      ROS: All other systems reviewed and are otherwise negative except as noted in History of Present Illness.  Studies Reviewed    EKG Interpretation Date/Time:  Friday September 15 2023 10:48:47 EDT Ventricular Rate:  60 PR Interval:  158 QRS Duration:  100 QT Interval:  472 QTC Calculation: 472 R Axis:   44  Text Interpretation: Sinus rhythm with occasional Premature ventricular complexes Incomplete right bundle branch block When compared with ECG of 14-Dec-2022 19:32, Premature ventricular complexes are now Present Incomplete right bundle branch block is now Present ST no longer depressed in Inferior leads ST elevation now present in Anterior leads Nonspecific T wave abnormality no longer evident in Inferior leads T wave inversion no longer evident in Lateral leads Confirmed by Carlos Levering [phone]) on 09/15/2023 11:10:08 AM    Risk Assessment/Calculations      HYPERTENSION CONTROL Vitals:   09/15/23 1050 09/15/23 1242  BP: (!) 160/54 (!) 150/60    The patient's blood pressure is elevated above target today.  In order to address the patient's elevated BP: A new medication  was prescribed today.           Physical Exam    VS:  BP (!) 160/54   Pulse 60   Ht 5\' 7"  (1.702 m)   Wt 153 lb (69.4 kg)   SpO2 98%   BMI 23.96 kg/m  , BMI Body mass index is 23.96 kg/m.  GEN: Well nourished, well developed, in no acute distress. Neck: No JVD or carotid bruits. Cardiac:  RRR.  Occasional extrasystole.  No murmurs. No rubs or gallops.    Respiratory:  Respirations regular and unlabored. Clear to auscultation without rales, wheezing or rhonchi. GI: Soft, nontender, nondistended. Extremities: Radials/DP/PT 2+ and equal bilaterally. No clubbing or cyanosis. No edema.  Skin: Warm and dry, no rash. Neuro: Strength intact.  Assessment & Plan    Chronic diastolic heart failure.  Echo January 2024 showed EF 60 to 65%, moderately enlarged RV, moderately elevated pulmonary artery systolic pressure, severe LAE, moderate RAE, moderate left lateral pleural effusion.  Patient denies lower extremity edema, orthopnea, PND, DOE.   He reports brisk diuresis on current dose of Lasix.  He is able to walk and lift weights for exercise without shortness of breath. Euvolemic and well compensated on exam.  Continue Lasix and metoprolol. CAD.  LHC March 2014 occlusion of LAD with excellent right to left collaterals and collateral/septal perforators.  Patient denies chest pain, tightness, or pressure.  Continue metoprolol and Zocor. Hypertension.  BP today 160/54 on intake and 150/60 on my recheck.  Home BP typically 145-165/50-60.  Patient denies headaches or dizziness.  Continue amlodipine.  Will have patient stop metoprolol and start carvedilol 3.125 mg twice daily.  Goal SBP <140. Hyperlipidemia.  LDL March 2024 82.  Continue Zocor.  Disposition: Stop metoprolol and start carvedilol 3.125 mg twice daily.  Return in 3 months or sooner as needed.         Signed, Etta Grandchild. Soriyah Osberg, DNP, NP-C

## 2023-09-15 ENCOUNTER — Ambulatory Visit: Payer: Medicare Other | Attending: Student | Admitting: Student

## 2023-09-15 ENCOUNTER — Encounter: Payer: Self-pay | Admitting: Student

## 2023-09-15 VITALS — BP 150/60 | HR 60 | Ht 67.0 in | Wt 153.0 lb

## 2023-09-15 DIAGNOSIS — I5032 Chronic diastolic (congestive) heart failure: Secondary | ICD-10-CM | POA: Diagnosis not present

## 2023-09-15 DIAGNOSIS — I10 Essential (primary) hypertension: Secondary | ICD-10-CM | POA: Diagnosis not present

## 2023-09-15 DIAGNOSIS — E785 Hyperlipidemia, unspecified: Secondary | ICD-10-CM | POA: Insufficient documentation

## 2023-09-15 DIAGNOSIS — I2510 Atherosclerotic heart disease of native coronary artery without angina pectoris: Secondary | ICD-10-CM | POA: Diagnosis not present

## 2023-09-15 MED ORDER — CARVEDILOL 3.125 MG PO TABS
3.1250 mg | ORAL_TABLET | Freq: Two times a day (BID) | ORAL | 3 refills | Status: DC
Start: 2023-09-15 — End: 2024-09-03

## 2023-09-15 NOTE — Patient Instructions (Addendum)
Medication Instructions:  STOP Metoprolol Tartrate 25mg . You are to no longer take this medication.  START Carvedilol 3.125mg . Take this medication twice (2) daily.   *If you need a refill on your cardiac medications before your next appointment, please call your pharmacy*   Lab Work: NONE If you have labs (blood work) drawn today and your tests are completely normal, you will receive your results only by: MyChart Message (if you have MyChart) OR A paper copy in the mail If you have any lab test that is abnormal or we need to change your treatment, we will call you to review the results.   Testing/Procedures: NONE   Follow-Up: At [facility], you and your health needs are our priority.  As part of our continuing mission to provide you with exceptional heart care, we have created designated Provider Care Teams.  These Care Teams include your primary Cardiologist (physician) and Advanced Practice Providers (APPs -  Physician Assistants and Nurse Practitioners) who all work together to provide you with the care you need, when you need it.  We recommend signing up for the patient portal called "MyChart".  Sign up information is provided on this After Visit Summary.  MyChart is used to connect with patients for Virtual Visits (Telemedicine).  Patients are able to view lab/test results, encounter notes, upcoming appointments, etc.  Non-urgent messages can be sent to your provider as well.   To learn more about what you can do with MyChart, go to [email or web address].    Your next appointment:   3 month(s)  Provider:   Nanetta Batty, MD or APP

## 2023-09-17 ENCOUNTER — Encounter: Payer: Self-pay | Admitting: Oncology

## 2023-10-09 DIAGNOSIS — H40013 Open angle with borderline findings, low risk, bilateral: Secondary | ICD-10-CM | POA: Diagnosis not present

## 2023-11-07 ENCOUNTER — Inpatient Hospital Stay (HOSPITAL_BASED_OUTPATIENT_CLINIC_OR_DEPARTMENT_OTHER): Payer: Medicare Other | Admitting: Oncology

## 2023-11-07 ENCOUNTER — Inpatient Hospital Stay: Payer: Medicare Other

## 2023-11-07 ENCOUNTER — Inpatient Hospital Stay: Payer: Medicare Other | Attending: Nurse Practitioner

## 2023-11-07 VITALS — BP 160/72 | HR 76 | Temp 98.2°F | Resp 18 | Ht 67.0 in | Wt 154.9 lb

## 2023-11-07 DIAGNOSIS — C49A3 Gastrointestinal stromal tumor of small intestine: Secondary | ICD-10-CM | POA: Insufficient documentation

## 2023-11-07 DIAGNOSIS — D649 Anemia, unspecified: Secondary | ICD-10-CM | POA: Insufficient documentation

## 2023-11-07 DIAGNOSIS — E538 Deficiency of other specified B group vitamins: Secondary | ICD-10-CM | POA: Diagnosis not present

## 2023-11-07 DIAGNOSIS — Z8619 Personal history of other infectious and parasitic diseases: Secondary | ICD-10-CM | POA: Diagnosis not present

## 2023-11-07 DIAGNOSIS — I252 Old myocardial infarction: Secondary | ICD-10-CM | POA: Insufficient documentation

## 2023-11-07 DIAGNOSIS — Z23 Encounter for immunization: Secondary | ICD-10-CM

## 2023-11-07 DIAGNOSIS — N289 Disorder of kidney and ureter, unspecified: Secondary | ICD-10-CM | POA: Diagnosis not present

## 2023-11-07 LAB — CBC WITH DIFFERENTIAL (CANCER CENTER ONLY)
Abs Immature Granulocytes: 0.01 10*3/uL (ref 0.00–0.07)
Basophils Absolute: 0 10*3/uL (ref 0.0–0.1)
Basophils Relative: 0 %
Eosinophils Absolute: 0.1 10*3/uL (ref 0.0–0.5)
Eosinophils Relative: 2 %
HCT: 32.6 % — ABNORMAL LOW (ref 39.0–52.0)
Hemoglobin: 10.8 g/dL — ABNORMAL LOW (ref 13.0–17.0)
Immature Granulocytes: 0 %
Lymphocytes Relative: 39 %
Lymphs Abs: 2.5 10*3/uL (ref 0.7–4.0)
MCH: 33.2 pg (ref 26.0–34.0)
MCHC: 33.1 g/dL (ref 30.0–36.0)
MCV: 100.3 fL — ABNORMAL HIGH (ref 80.0–100.0)
Monocytes Absolute: 0.5 10*3/uL (ref 0.1–1.0)
Monocytes Relative: 7 %
Neutro Abs: 3.3 10*3/uL (ref 1.7–7.7)
Neutrophils Relative %: 52 %
Platelet Count: 189 10*3/uL (ref 150–400)
RBC: 3.25 MIL/uL — ABNORMAL LOW (ref 4.22–5.81)
RDW: 12.2 % (ref 11.5–15.5)
WBC Count: 6.4 10*3/uL (ref 4.0–10.5)
nRBC: 0 % (ref 0.0–0.2)

## 2023-11-07 LAB — CMP (CANCER CENTER ONLY)
ALT: 15 U/L (ref 0–44)
AST: 20 U/L (ref 15–41)
Albumin: 3.8 g/dL (ref 3.5–5.0)
Alkaline Phosphatase: 57 U/L (ref 38–126)
Anion gap: 6 (ref 5–15)
BUN: 29 mg/dL — ABNORMAL HIGH (ref 8–23)
CO2: 31 mmol/L (ref 22–32)
Calcium: 9 mg/dL (ref 8.9–10.3)
Chloride: 106 mmol/L (ref 98–111)
Creatinine: 1.92 mg/dL — ABNORMAL HIGH (ref 0.61–1.24)
GFR, Estimated: 32 mL/min — ABNORMAL LOW (ref >60)
Glucose, Bld: 115 mg/dL — ABNORMAL HIGH (ref 70–99)
Potassium: 4 mmol/L (ref 3.5–5.1)
Sodium: 143 mmol/L (ref 135–145)
Total Bilirubin: 0.4 mg/dL (ref <1.2)
Total Protein: 6.6 g/dL (ref 6.5–8.1)

## 2023-11-07 MED ORDER — INFLUENZA VAC A&B SURF ANT ADJ 0.5 ML IM SUSY
0.5000 mL | PREFILLED_SYRINGE | Freq: Once | INTRAMUSCULAR | Status: AC
  Administered 2023-11-07: 0.5 mL via INTRAMUSCULAR
  Filled 2023-11-07: qty 0.5

## 2023-11-07 NOTE — Progress Notes (Signed)
[name] OFFICE PROGRESS NOTE   Diagnosis: Gastrointestinal stromal [name]  INTERVAL HISTORY:   [name] returns as scheduled.  He feels well.  He continues Gleevec.  No rash, diarrhea, or swelling. No complaint Objective:  Vital signs in last 24 hours:  Blood pressure (!) 160/72, pulse 76, temperature 98.2 F (36.8 C), temperature source Temporal, resp. rate 18, height 5\' 7"  (1.702 m), weight 154 lb 14.4 oz (70.3 kg), SpO2 99%.    HEENT: No thrush or ulcers Resp: Lungs clear bilaterally Cardio: Regular rate and rhythm GI: No hepatosplenomegaly, no mass, nontender, right inguinal hernia Vascular: No leg edema Skin: No rash  Lab Results:  Lab Results  Component Value Date   WBC 6.4 11/07/2023   HGB 10.8 (L) 11/07/2023   HCT 32.6 (L) 11/07/2023   MCV 100.3 (H) 11/07/2023   PLT 189 11/07/2023   NEUTROABS 3.3 11/07/2023    CMP  Lab Results  Component Value Date   NA 143 11/07/2023   K 4.0 11/07/2023   CL 106 11/07/2023   CO2 31 11/07/2023   GLUCOSE 115 (H) 11/07/2023   BUN 29 (H) 11/07/2023   CREATININE 1.92 (H) 11/07/2023   CALCIUM 9.0 11/07/2023   PROT 6.6 11/07/2023   ALBUMIN 3.8 11/07/2023   AST 20 11/07/2023   ALT 15 11/07/2023   ALKPHOS 57 11/07/2023   BILITOT 0.4 11/07/2023   GFRNONAA 32 (L) 11/07/2023   GFRAA 51 (L) 08/05/2020    Medications: I have reviewed the patient's current medications.   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 CT 10/16/2014 consistent with extensive carcinomatosis, CT-guided biopsy of an omental mass 10/21/2014 confirmed a gastrointestinal stromal [name] Initiation of Gleevec 10/31/2014 CT 01/13/2015 revealed improvement in the peritoneal and omental metastatic disease CT 05/05/2015 with no progression of omental/peritoneal metastatic disease, increased ascites, and appendix inflammation CT 07/07/2016-no abscess, mild perihepatic and left  pericolic ascites and mesenteric edema CT of the pelvis 09/20/2016, no residual abscess, no ascites CT abdomen pelvis renal stones study 04/03/2017-partial small bowel obstruction, potentially related to right inguinal hernia CT abdomen/pelvis 08/18/2017-possible cholecystitis, 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  Cholelithiasis. CT renal stone study 06/04/2021-large right and small left inguinal hernias, no ascites CTs 11/22/2022-small bilateral pleural effusions small volume ascites, gallstones, large right inguinal hernia, borderline enlarged mediastinal nodes Gleevec 400 mg every other day beginning 12/22/2022 due to edema and pleural effusions     2. History of anemia, status post a nondiagnostic bone marrow biopsy 10/21/2014   3. NSTEMI March 2014   4. Admission 05/06/2015 with acute onset right abdomen pain-potentially related to acute appendicitis versus pain from carcinomatosis CT 05/13/2015 consistent with a right lower abdomen abscess, status post catheter drainage by interventional radiology 05/13/2015 Follow-up CT 05/19/2015 showed resolution of the abscess. Follow-up evaluation in interventional radiology 06/01/2015 showed the abscess cavity had resolved. The abscess drainage catheter communicated with the cecum via the appendix. The catheter was left in place. CT abdomen/pelvis 06/08/2015 showed the right pelvic drain in place without recurrent or residual surrounding fluid collection. Peritoneal metastasis with slight increase in small to moderate volume of abdominal pelvic ascites. Drainage catheter injection 06/12/2015 showed residual  tiny fistulous  connection with the end of the decompressed abscess cavity within the right lower abdominal quadrant and the residual lumen of the appendix. Paracentesis 06/12/2015 with 5 liters of fluid removed Paracentesis 06/19/2015-negative cytology and culture Removal of abscess drainage catheter 06/23/2015 CT 07/26/2015 with increased right abdominal wall inflammation and increased fluid collection at site of previous right abdomen abscess, ascites, and enlargement/inflammation of the appendix Placement of right lower quadrant percutaneous drainage catheter 07/27/2015. Contrast injection confirmed persistent fistulous connection with the ill-defined fluid collection and the residual appendix and cecum. Paracentesis with 2 L of fluid removed. Follow-up CT abdomen/pelvis 08/05/2015 with small to moderate left pleural effusion and moderate to large volume ascites with both appearing slightly decreased since the prior study. Right lower quadrant drainage catheter in place. No residual fluid collection around the drain or in the adjacent abdomen or pelvis. 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 Drainage catheter left in place. 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 Derrell [name]     5. C. difficile colitis 05/10/2015, [name] [name] 2019   6.  Right leg edema 12/18/2015. Negative venous Doppler.   7.  Renal insufficiency   8.  Urinary retention June 2022-temporary Foley catheter placed, started on Flomax   9.  B12 deficiency-oral B12 initiated 12/15/2021   10.  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 appears stable.  There is no clinical evidence for progression of the gastrointestinal stromal [name].  He will continue Gleevec at the current dose.  Mr. Novakovich would like to space our follow-up interval.  He will return for an office and lab visit in 3 months.  He will call in the interim as needed.  He received an influenza vaccine today.  Thornton Papas, MD  11/07/2023  10:24 AM

## 2023-11-28 ENCOUNTER — Ambulatory Visit: Payer: PRIVATE HEALTH INSURANCE | Admitting: General Practice

## 2023-12-29 ENCOUNTER — Other Ambulatory Visit: Payer: Self-pay | Admitting: Pharmacist

## 2023-12-29 ENCOUNTER — Other Ambulatory Visit (HOSPITAL_COMMUNITY): Payer: Self-pay

## 2023-12-29 ENCOUNTER — Other Ambulatory Visit: Payer: Self-pay

## 2023-12-29 ENCOUNTER — Telehealth: Payer: Self-pay

## 2023-12-29 DIAGNOSIS — C49A3 Gastrointestinal stromal tumor of small intestine: Secondary | ICD-10-CM

## 2023-12-29 MED ORDER — IMATINIB MESYLATE 400 MG PO TABS
400.0000 mg | ORAL_TABLET | ORAL | 0 refills | Status: DC
  Filled 2023-12-29: qty 30, 60d supply, fill #0
  Filled 2023-12-29: qty 15, 30d supply, fill #0
  Filled 2024-01-18: qty 15, 30d supply, fill #1

## 2023-12-29 NOTE — Telephone Encounter (Signed)
 Called and left VM for patient to return my call to [facility] with [facility] for 2025. I will continue to reach out to patient.   Call 1 - LVM 12/29/23   Morene Potters, CPhT Oncology Pharmacy Patient Advocate  [facility]  [phone] (phone) [phone] (fax) 12/29/2023 1:46 PM

## 2023-12-29 NOTE — Progress Notes (Signed)
 Specialty Pharmacy Initiation Note   [name] [name] is a 89 y.o. male who will be followed by the specialty pharmacy service for RxSp Oncology    Review of administration, indication, effectiveness, safety, potential side effects, storage/disposable, and missed dose instructions occurred today for patient's specialty medication(s) Imatinib  Mesylate (GLEEVEC )     Patient/Caregiver did not have any additional questions or concerns.   Patient's therapy is appropriate to: Continue    Goals Addressed             This Visit's Progress    Slow Disease Progression       Patient is on track. Patient will maintain adherence        Patient switching from PAP to filling at [facility] (Specialty)  Renaee LOISE Ditch Specialty Pharmacist

## 2023-12-29 NOTE — Telephone Encounter (Signed)
 Oral Oncology Patient Advocate Encounter  Re-authorization   Received notification that prior authorization for Imatinib  is required.   PA submitted on 12/29/23  Key [id number]  Status is pending     Morene Potters, CPhT Oncology Pharmacy Patient Advocate  [facility]  [phone] (phone) [phone] (fax) 12/29/2023 1:38 PM

## 2023-12-29 NOTE — Telephone Encounter (Signed)
 Oral Oncology Patient Advocate Encounter  Prior Authorization for Imatinib  has been approved.    PA# [id number]  Effective dates: 12/20/23 through 12/18/24  Patients co-pay is $24.95.    Morene Potters, CPhT Oncology Pharmacy Patient Advocate  [facility]  [phone] (phone) [phone] (fax) 12/29/2023 1:44 PM

## 2023-12-29 NOTE — Progress Notes (Signed)
 Specialty Pharmacy Initial Fill Coordination Note  [name] [name] is a 89 y.o. male contacted today regarding initial fill of specialty medication(s) Imatinib  Mesylate (GLEEVEC )  Patient requested Delivery   Delivery date: 01/02/24   Verified address: [address] [place], [place] [address]  Medication will be filled on 01/01/24.   Patient is aware of $24.95 copayment. Patient requests bill to AR.    Morene Potters, CPhT Oncology Pharmacy Patient Advocate  [facility]  [phone] (phone) [phone] (fax) 12/29/2023 3:39 PM

## 2023-12-29 NOTE — Telephone Encounter (Signed)
 Patient successfully OnBoarded. Medication scheduled to be shipped on Monday, 01/01/24, for delivery on Tuesday, 01/02/24, from [facility] to patient's address. Patient also knows to call me at [phone] with any questions or concerns regarding receiving medication or if there is any unexpected change in co-pay.    Morene Potters, CPhT Oncology Pharmacy Patient Advocate  [facility]  [phone] (phone) [phone] (fax) 12/29/2023 3:42 PM

## 2023-12-29 NOTE — Telephone Encounter (Signed)
 Patient switching from PAP to filling at [facility] (Specialty)

## 2024-01-01 ENCOUNTER — Other Ambulatory Visit: Payer: Self-pay

## 2024-01-15 ENCOUNTER — Other Ambulatory Visit: Payer: Self-pay | Admitting: Cardiovascular Disease

## 2024-01-18 ENCOUNTER — Other Ambulatory Visit: Payer: Self-pay

## 2024-01-18 NOTE — Progress Notes (Signed)
Specialty Pharmacy Refill Coordination Note  [name] [name] is a 89 y.o. male contacted today regarding refills of specialty medication(s) Imatinib Mesylate (GLEEVEC)   Patient requested Delivery   Delivery date: 01/25/24   Verified address: [address] [place], [place] [address]   Medication will be filled on 01/24/24.   Patient would like to pay AR balance at [facility].

## 2024-01-18 NOTE — Progress Notes (Signed)
Specialty Pharmacy Ongoing Clinical Assessment Note  [name] [name] is a 89 y.o. male who is being followed by the specialty pharmacy service for RxSp Oncology   Patient's specialty medication(s) reviewed today: Imatinib Mesylate (GLEEVEC)  Patient has been on Gleevec for years.   Missed doses in the last 4 weeks: 0   Patient/Caregiver did not have any additional questions or concerns.   Therapeutic benefit summary: Patient is achieving benefit (Per OV note, 11/07/23, "there is no clinical evidence for progressionj of the GI stromal tumor")   Adverse events/side effects summary: No adverse events/side effects   Patient's therapy is appropriate to: Continue    Goals Addressed             This Visit's Progress    Stabilization of disease       Patient is on track. Patient will maintain adherence         Follow up:  3 months  Bobette Mo Specialty Pharmacist

## 2024-01-24 ENCOUNTER — Other Ambulatory Visit: Payer: Self-pay

## 2024-02-08 ENCOUNTER — Inpatient Hospital Stay: Payer: Medicare Other

## 2024-02-08 ENCOUNTER — Inpatient Hospital Stay: Payer: Medicare Other | Attending: Oncology | Admitting: Oncology

## 2024-02-08 ENCOUNTER — Other Ambulatory Visit: Payer: Medicare Other

## 2024-02-08 VITALS — BP 148/60 | HR 60 | Temp 98.1°F | Resp 18 | Ht 67.0 in | Wt 153.6 lb

## 2024-02-08 DIAGNOSIS — D638 Anemia in other chronic diseases classified elsewhere: Secondary | ICD-10-CM | POA: Diagnosis not present

## 2024-02-08 DIAGNOSIS — C49A3 Gastrointestinal stromal tumor of small intestine: Secondary | ICD-10-CM

## 2024-02-08 DIAGNOSIS — I252 Old myocardial infarction: Secondary | ICD-10-CM | POA: Diagnosis not present

## 2024-02-08 DIAGNOSIS — Z8619 Personal history of other infectious and parasitic diseases: Secondary | ICD-10-CM | POA: Diagnosis not present

## 2024-02-08 DIAGNOSIS — C49A9 Gastrointestinal stromal tumor of other sites: Secondary | ICD-10-CM | POA: Insufficient documentation

## 2024-02-08 DIAGNOSIS — N289 Disorder of kidney and ureter, unspecified: Secondary | ICD-10-CM | POA: Diagnosis not present

## 2024-02-08 DIAGNOSIS — K4090 Unilateral inguinal hernia, without obstruction or gangrene, not specified as recurrent: Secondary | ICD-10-CM | POA: Diagnosis not present

## 2024-02-08 DIAGNOSIS — E538 Deficiency of other specified B group vitamins: Secondary | ICD-10-CM | POA: Diagnosis not present

## 2024-02-08 LAB — CBC WITH DIFFERENTIAL (CANCER CENTER ONLY)
Abs Immature Granulocytes: 0.01 10*3/uL (ref 0.00–0.07)
Basophils Absolute: 0 10*3/uL (ref 0.0–0.1)
Basophils Relative: 0 %
Eosinophils Absolute: 0.1 10*3/uL (ref 0.0–0.5)
Eosinophils Relative: 1 %
HCT: 33.9 % — ABNORMAL LOW (ref 39.0–52.0)
Hemoglobin: 11.1 g/dL — ABNORMAL LOW (ref 13.0–17.0)
Immature Granulocytes: 0 %
Lymphocytes Relative: 29 %
Lymphs Abs: 2 10*3/uL (ref 0.7–4.0)
MCH: 33.3 pg (ref 26.0–34.0)
MCHC: 32.7 g/dL (ref 30.0–36.0)
MCV: 101.8 fL — ABNORMAL HIGH (ref 80.0–100.0)
Monocytes Absolute: 0.5 10*3/uL (ref 0.1–1.0)
Monocytes Relative: 7 %
Neutro Abs: 4.2 10*3/uL (ref 1.7–7.7)
Neutrophils Relative %: 63 %
Platelet Count: 218 10*3/uL (ref 150–400)
RBC: 3.33 MIL/uL — ABNORMAL LOW (ref 4.22–5.81)
RDW: 13.1 % (ref 11.5–15.5)
WBC Count: 6.7 10*3/uL (ref 4.0–10.5)
nRBC: 0 % (ref 0.0–0.2)

## 2024-02-08 LAB — CMP (CANCER CENTER ONLY)
ALT: 13 U/L (ref 0–44)
AST: 19 U/L (ref 15–41)
Albumin: 4.3 g/dL (ref 3.5–5.0)
Alkaline Phosphatase: 69 U/L (ref 38–126)
Anion gap: 8 (ref 5–15)
BUN: 34 mg/dL — ABNORMAL HIGH (ref 8–23)
CO2: 29 mmol/L (ref 22–32)
Calcium: 8.8 mg/dL — ABNORMAL LOW (ref 8.9–10.3)
Chloride: 104 mmol/L (ref 98–111)
Creatinine: 2 mg/dL — ABNORMAL HIGH (ref 0.61–1.24)
GFR, Estimated: 31 mL/min — ABNORMAL LOW (ref >60)
Glucose, Bld: 102 mg/dL — ABNORMAL HIGH (ref 70–99)
Potassium: 4.5 mmol/L (ref 3.5–5.1)
Sodium: 141 mmol/L (ref 135–145)
Total Bilirubin: 0.5 mg/dL (ref 0.0–1.2)
Total Protein: 7.4 g/dL (ref 6.5–8.1)

## 2024-02-08 LAB — MAGNESIUM: Magnesium: 3 mg/dL — ABNORMAL HIGH (ref 1.7–2.4)

## 2024-02-08 NOTE — Progress Notes (Signed)
[facility] OFFICE PROGRESS NOTE   Diagnosis: Gastrointestinal stromal tumor  INTERVAL HISTORY:   [name] returns as scheduled.  He is now taking generic imatinib.  No rash, swelling, or diarrhea.  He feels well.  No complaint.  Objective:  Vital signs in last 24 hours:  Blood pressure (!) 148/60, pulse 60, temperature 98.1 F (36.7 C), temperature source Temporal, resp. rate 18, height 5\' 7"  (1.702 m), weight 153 lb 9.6 oz (69.7 kg), SpO2 98%.    HEENT: No thrush or ulcers Resp: Clear bilaterally Cardio: Regular rate and rhythm GI: No hepatosplenomegaly, no apparent ascites, nontender, no mass, right inguinal hernia Vascular: No leg edema     Lab Results:  Lab Results  Component Value Date   WBC 6.7 02/08/2024   HGB 11.1 (L) 02/08/2024   HCT 33.9 (L) 02/08/2024   MCV 101.8 (H) 02/08/2024   PLT 218 02/08/2024   NEUTROABS 4.2 02/08/2024    CMP  Lab Results  Component Value Date   NA 141 02/08/2024   K 4.5 02/08/2024   CL 104 02/08/2024   CO2 29 02/08/2024   GLUCOSE 102 (H) 02/08/2024   BUN 34 (H) 02/08/2024   CREATININE 2.00 (H) 02/08/2024   CALCIUM 8.8 (L) 02/08/2024   PROT 7.4 02/08/2024   ALBUMIN 4.3 02/08/2024   AST 19 02/08/2024   ALT 13 02/08/2024   ALKPHOS 69 02/08/2024   BILITOT 0.5 02/08/2024   GFRNONAA 31 (L) 02/08/2024   GFRAA 51 (L) 08/05/2020    Medications: I have reviewed the patient's current medications.   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 CT 10/16/2014 consistent with extensive carcinomatosis, CT-guided biopsy of an omental mass 10/21/2014 confirmed a gastrointestinal stromal tumor Initiation of Gleevec 10/31/2014 CT 01/13/2015 revealed improvement in the peritoneal and omental metastatic disease CT 05/05/2015 with no progression of omental/peritoneal metastatic disease, increased ascites, and appendix inflammation CT 07/07/2016-no abscess, mild  perihepatic and left pericolic ascites and mesenteric edema CT of the pelvis 09/20/2016, no residual abscess, no ascites CT abdomen pelvis renal stones study 04/03/2017-partial small bowel obstruction, potentially related to right inguinal hernia CT abdomen/pelvis 08/18/2017-possible cholecystitis, 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  Cholelithiasis. CT renal stone study 06/04/2021-large right and small left inguinal hernias, no ascites CTs 11/22/2022-small bilateral pleural effusions small volume ascites, gallstones, large right inguinal hernia, borderline enlarged mediastinal nodes Gleevec 400 mg every other day beginning 12/22/2022 due to edema and pleural effusions     2. History of anemia, status post a nondiagnostic bone marrow biopsy 10/21/2014   3. NSTEMI March 2014   4. Admission 05/06/2015 with acute onset right abdomen pain-potentially related to acute appendicitis versus pain from carcinomatosis CT 05/13/2015 consistent with a right lower abdomen abscess, status post catheter drainage by interventional radiology 05/13/2015 Follow-up CT 05/19/2015 showed resolution of the abscess. Follow-up evaluation in interventional radiology 06/01/2015 showed the abscess cavity had resolved. The abscess drainage catheter communicated with the cecum via the appendix. The catheter was left in place. CT abdomen/pelvis 06/08/2015 showed the right pelvic drain in place without recurrent or residual surrounding fluid collection. Peritoneal metastasis with slight increase in small to moderate volume of abdominal pelvic ascites. Drainage  catheter injection  06/12/2015 showed residual tiny fistulous connection with the end of the decompressed abscess cavity within the right lower abdominal quadrant and the residual lumen of the appendix. Paracentesis 06/12/2015 with 5 liters of fluid removed Paracentesis 06/19/2015-negative cytology and culture Removal of abscess drainage catheter 06/23/2015 CT 07/26/2015 with increased right abdominal wall inflammation and increased fluid collection at site of previous right abdomen abscess, ascites, and enlargement/inflammation of the appendix Placement of right lower quadrant percutaneous drainage catheter 07/27/2015. Contrast injection confirmed persistent fistulous connection with the ill-defined fluid collection and the residual appendix and cecum. Paracentesis with 2 L of fluid removed. Follow-up CT abdomen/pelvis 08/05/2015 with small to moderate left pleural effusion and moderate to large volume ascites with both appearing slightly decreased since the prior study. Right lower quadrant drainage catheter in place. No residual fluid collection around the drain or in the adjacent abdomen or pelvis. 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 Drainage catheter left in place. 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 Derrell Lolling     5. C. difficile colitis 05/10/2015, [name] 2019   6.  Right leg edema 12/18/2015. Negative venous Doppler.   7.  Renal insufficiency   8.  Urinary retention June 2022-temporary Foley catheter placed, started on Flomax   9.  B12 deficiency-oral B12 initiated  12/15/2021   10.  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 appears stable.  There is no clinical evidence for progression of the gastrointestinal stromal tumor.  He is tolerating the imatinib well.  He will continue the current treatment.  He does not have significant symptoms related to the right inguinal hernia.  Mr. Boettcher will return for an office and lab visit in 3 months.  Thornton Papas, MD  02/08/2024  11:40 AM

## 2024-02-13 ENCOUNTER — Other Ambulatory Visit: Payer: Self-pay

## 2024-02-16 ENCOUNTER — Other Ambulatory Visit: Payer: Self-pay

## 2024-02-16 ENCOUNTER — Other Ambulatory Visit (HOSPITAL_COMMUNITY): Payer: Self-pay

## 2024-02-16 ENCOUNTER — Other Ambulatory Visit: Payer: Self-pay | Admitting: Oncology

## 2024-02-16 DIAGNOSIS — C49A3 Gastrointestinal stromal tumor of small intestine: Secondary | ICD-10-CM

## 2024-02-16 MED ORDER — IMATINIB MESYLATE 400 MG PO TABS
400.0000 mg | ORAL_TABLET | ORAL | 0 refills | Status: DC
  Filled 2024-02-16: qty 15, 30d supply, fill #0
  Filled 2024-04-01 (×2): qty 15, 30d supply, fill #1

## 2024-02-16 NOTE — Progress Notes (Signed)
 Specialty Pharmacy Refill Coordination Note  [name] [name] is a 89 y.o. male contacted today regarding refills of specialty medication(s) Imatinib Mesylate (GLEEVEC)   Patient requested Delivery   Delivery date: 02/22/24   Verified address: [address]   [place] [address]   Medication will be filled on 02/21/24.   This fill date is pending response to refill request from provider. Patient is aware and if they have not received fill by intended date they must follow up with pharmacy.

## 2024-02-20 DIAGNOSIS — K5900 Constipation, unspecified: Secondary | ICD-10-CM | POA: Diagnosis not present

## 2024-02-20 DIAGNOSIS — I2510 Atherosclerotic heart disease of native coronary artery without angina pectoris: Secondary | ICD-10-CM | POA: Diagnosis not present

## 2024-02-20 DIAGNOSIS — I10 Essential (primary) hypertension: Secondary | ICD-10-CM | POA: Diagnosis not present

## 2024-02-20 DIAGNOSIS — I5032 Chronic diastolic (congestive) heart failure: Secondary | ICD-10-CM | POA: Diagnosis not present

## 2024-02-20 DIAGNOSIS — G4700 Insomnia, unspecified: Secondary | ICD-10-CM | POA: Diagnosis not present

## 2024-02-20 DIAGNOSIS — N1832 Chronic kidney disease, stage 3b: Secondary | ICD-10-CM | POA: Diagnosis not present

## 2024-02-20 DIAGNOSIS — E782 Mixed hyperlipidemia: Secondary | ICD-10-CM | POA: Diagnosis not present

## 2024-02-21 ENCOUNTER — Other Ambulatory Visit: Payer: Self-pay

## 2024-02-24 ENCOUNTER — Other Ambulatory Visit: Payer: Self-pay | Admitting: Oncology

## 2024-02-24 DIAGNOSIS — C49A3 Gastrointestinal stromal tumor of small intestine: Secondary | ICD-10-CM

## 2024-02-24 DIAGNOSIS — J90 Pleural effusion, not elsewhere classified: Secondary | ICD-10-CM

## 2024-02-26 ENCOUNTER — Encounter: Payer: Self-pay | Admitting: Oncology

## 2024-03-13 ENCOUNTER — Ambulatory Visit: Admitting: Cardiovascular Disease

## 2024-03-13 DIAGNOSIS — H40013 Open angle with borderline findings, low risk, bilateral: Secondary | ICD-10-CM | POA: Diagnosis not present

## 2024-03-14 ENCOUNTER — Other Ambulatory Visit: Payer: Self-pay | Admitting: Cardiovascular Disease

## 2024-03-26 ENCOUNTER — Encounter: Payer: Self-pay | Admitting: Oncology

## 2024-03-26 ENCOUNTER — Ambulatory Visit: Attending: Cardiovascular Disease | Admitting: Cardiovascular Disease

## 2024-03-26 ENCOUNTER — Encounter: Payer: Self-pay | Admitting: Cardiovascular Disease

## 2024-03-26 VITALS — BP 128/72 | HR 78 | Ht 67.5 in | Wt 153.8 lb

## 2024-03-26 DIAGNOSIS — E782 Mixed hyperlipidemia: Secondary | ICD-10-CM | POA: Insufficient documentation

## 2024-03-26 DIAGNOSIS — I10 Essential (primary) hypertension: Secondary | ICD-10-CM | POA: Diagnosis not present

## 2024-03-26 DIAGNOSIS — I5033 Acute on chronic diastolic (congestive) heart failure: Secondary | ICD-10-CM | POA: Diagnosis not present

## 2024-03-26 DIAGNOSIS — I2510 Atherosclerotic heart disease of native coronary artery without angina pectoris: Secondary | ICD-10-CM | POA: Diagnosis not present

## 2024-03-26 NOTE — Assessment & Plan Note (Signed)
 History of essential hypertension her blood pressure [name] [name] measured today at 128/72.  He is on amlodipine and carvedilol.

## 2024-03-26 NOTE — Assessment & Plan Note (Signed)
 History of hyperlipidemia on statin therapy with lipid profile performed 02/21/2023 revealing total cholesterol 145, LDL 82 and HDL 47.

## 2024-03-26 NOTE — Patient Instructions (Signed)
   Follow-Up: At [facility], you and your health needs are our priority.  As part of our continuing mission to provide you with exceptional heart care, our providers are all part of one team.  This team includes your primary Cardiologist (physician) and Advanced Practice Providers or APPs (Physician Assistants and Nurse Practitioners) who all work together to provide you with the care you need, when you need it.  Your next appointment:   12 month(s)  Provider:   Nanetta Batty, MD             1st Floor: - Lobby - Registration  - Pharmacy  - Lab - Cafe  2nd Floor: - PV Lab - Diagnostic Testing (echo, CT, nuclear med)  3rd Floor: - Vacant  4th Floor: - TCTS (cardiothoracic surgery) - AFib Clinic - Structural Heart Clinic - Vascular Surgery  - Vascular Ultrasound  5th Floor: - HeartCare Cardiology (general and EP) - Clinical Pharmacy for coumadin, hypertension, lipid, weight-loss medications, and med management appointments    Valet parking services will be available as well.

## 2024-03-26 NOTE — Assessment & Plan Note (Signed)
 History of CAD status post cardiac catheterization remotely revealing occluded LAD with bidirectional collaterals treated medically.  He denies chest pain.

## 2024-03-26 NOTE — Progress Notes (Signed)
 03/26/2024 [name] [name]   [birth year] [id number]  Primary Physician [name] Rua, MD Primary Cardiologist: [name] Gess MD [name] [name], [place]  HPI:  [name] [name] is a 89 y.o.     mildly overweight married Caucasian male father of 2 children, grandfather 4 grandchildren is accompanied by his wife [name] [name] today.  He was referred by the emergency room who we saw last night because of heart failure.  I last saw him in the office 12/15/2022.  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  He lives independently with his wife.  He does have a history of treated hypertension and hyperlipidemia.  He is never had a stroke.  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 Tora Duck on chemotherapy.  He is known CAD status post remote cath revealing an occluded LAD with bidirectional collaterals medically treated.  His last echo performed 09/27/2020 revealed normal LV systolic function without valvular abnormalities.  He was seen in the ER last night with volume overload, shortness of breath and dyspnea.  His BNP was 800.  Chest x-ray showed bilateral pleural effusions.  His serum creatinine was in the high 1 range.  He was given IV Lasix 40 mg and diuresed 1 L.  He felt clinically improved.   Since I saw him in the office a year ago he has remained stable.  He has not been hospitalized.  He appears euvolemic on furosemide.  His serum creatinine is in the 2 range.  He denies chest pain or shortness of breath.   Current Meds  Medication Sig   amLODipine (NORVASC) 10 MG tablet Take 10 mg by mouth daily.   ferrous sulfate 325 (65 FE) MG EC tablet Take 1 tablet (325 mg total) by mouth 2 (two) times daily with a meal.   furosemide (LASIX) 40 MG tablet Take 1 tablet (40 mg total) by mouth daily.   imatinib (GLEEVEC) 400 MG tablet Take 1 tablet (400 mg total) by mouth every other day. Take with meals and large glass of water.Caution:Chemotherapy.   loperamide (IMODIUM) 2 MG capsule  Take 1 capsule (2 mg total) by mouth 4 (four) times daily as needed for diarrhea or loose stools.   potassium chloride SA (KLOR-CON M20) 20 MEQ tablet Take 1 tablet by mouth once daily   simvastatin (ZOCOR) 20 MG tablet Take 20 mg by mouth daily.   traZODone (DESYREL) 100 MG tablet      No Known Allergies  Social History   Socioeconomic History   Marital status: Married    Spouse name: [name] [name]   Number of children: 2   Years of education: Not on file   Highest education level: Not on file  Occupational History   Occupation: Retired    Comment: Insurance claims handler  Tobacco Use   Smoking status: Never   Smokeless tobacco: Never  Vaping Use   Vaping status: Never Used  Substance and Sexual Activity   Alcohol use: No   Drug use: No   Sexual activity: Not Currently  Other Topics Concern   Not on file  Social History Narrative   Married, wife [name] [name]   Retired Neurosurgeon for 40 years   Independent in ADLs   Drives   #2 grown sons   Social Drivers of Corporate investment banker Strain: Not on file  Food Insecurity: Not on file  Transportation Needs: Not on file  Physical Activity: Not on file  Stress: Not  on file  Social Connections: Not on file  Intimate Partner Violence: Not on file     Review of Systems: General: negative for chills, fever, night sweats or weight changes.  Cardiovascular: negative for chest pain, dyspnea on exertion, edema, orthopnea, palpitations, paroxysmal nocturnal dyspnea or shortness of breath Dermatological: negative for rash Respiratory: negative for cough or wheezing Urologic: negative for hematuria Abdominal: negative for nausea, vomiting, diarrhea, bright red blood per rectum, melena, or hematemesis Neurologic: negative for visual changes, syncope, or dizziness All other systems reviewed and are otherwise negative except as noted above.    Blood pressure 128/72, pulse 78, height 5' 7.5" (1.715 m), weight 153 lb 12.8 oz (69.8 kg),  SpO2 96%.  General appearance: alert and no distress Neck: no adenopathy, no carotid bruit, no JVD, supple, symmetrical, trachea midline, and thyroid not enlarged, symmetric, no tenderness/mass/nodules Lungs: clear to auscultation bilaterally Heart: regular rate and rhythm Extremities: extremities normal, atraumatic, no cyanosis or edema Pulses: 2+ and symmetric Skin: Skin color, texture, turgor normal. No rashes or lesions Neurologic: Grossly normal  EKG EKG Interpretation Date/Time:  Tuesday March 26 2024 15:31:36 EDT Ventricular Rate:  78 PR Interval:  150 QRS Duration:  82 QT Interval:  396 QTC Calculation: 451 R Axis:   132  Text Interpretation: Normal sinus rhythm Right axis deviation Possible Right ventricular hypertrophy Abnormal QRS-T angle, consider primary T wave abnormality When compared with ECG of 15-Sep-2023 10:48, Premature ventricular complexes are no longer Present Incomplete right bundle branch block is no longer Present ST now depressed in Inferior leads T wave inversion now evident in Inferior leads Confirmed by Nanetta Batty [phone]) on 03/26/2024 3:45:56 PM    ASSESSMENT AND PLAN:   Hypertension History of essential hypertension her blood pressure [name] [name] measured today at 128/72.  He is on amlodipine and carvedilol.  Diastolic CHF, acute on chronic (HCC) History of diastolic heart failure on furosemide without evidence of lower extremity edema.  Hyperlipidemia History of hyperlipidemia on statin therapy with lipid profile performed 02/21/2023 revealing total cholesterol 145, LDL 82 and HDL 47.  CAD in native artery History of CAD status post cardiac catheterization remotely revealing occluded LAD with bidirectional collaterals treated medically.  He denies chest pain.     [name] Gess MD FACP,FACC,FAHA, [facility] 03/26/2024 3:52 PM

## 2024-03-26 NOTE — Assessment & Plan Note (Signed)
 History of diastolic heart failure on furosemide without evidence of lower extremity edema.

## 2024-03-31 ENCOUNTER — Emergency Department (HOSPITAL_COMMUNITY)

## 2024-03-31 ENCOUNTER — Emergency Department (HOSPITAL_COMMUNITY)
Admission: EM | Admit: 2024-03-31 | Discharge: 2024-03-31 | Disposition: A | Discharge status: Home / Self Care | Attending: Emergency Medicine | Admitting: Emergency Medicine

## 2024-03-31 DIAGNOSIS — I10 Essential (primary) hypertension: Secondary | ICD-10-CM | POA: Diagnosis not present

## 2024-03-31 DIAGNOSIS — K5730 Diverticulosis of large intestine without perforation or abscess without bleeding: Secondary | ICD-10-CM | POA: Diagnosis not present

## 2024-03-31 DIAGNOSIS — I2510 Atherosclerotic heart disease of native coronary artery without angina pectoris: Secondary | ICD-10-CM | POA: Diagnosis not present

## 2024-03-31 DIAGNOSIS — K639 Disease of intestine, unspecified: Secondary | ICD-10-CM | POA: Diagnosis not present

## 2024-03-31 DIAGNOSIS — Z79899 Other long term (current) drug therapy: Secondary | ICD-10-CM | POA: Insufficient documentation

## 2024-03-31 DIAGNOSIS — I509 Heart failure, unspecified: Secondary | ICD-10-CM | POA: Diagnosis not present

## 2024-03-31 DIAGNOSIS — I110 Hypertensive heart disease with heart failure: Secondary | ICD-10-CM | POA: Insufficient documentation

## 2024-03-31 DIAGNOSIS — R109 Unspecified abdominal pain: Secondary | ICD-10-CM | POA: Diagnosis not present

## 2024-03-31 DIAGNOSIS — K5900 Constipation, unspecified: Secondary | ICD-10-CM | POA: Insufficient documentation

## 2024-03-31 DIAGNOSIS — K8000 Calculus of gallbladder with acute cholecystitis without obstruction: Secondary | ICD-10-CM | POA: Diagnosis not present

## 2024-03-31 DIAGNOSIS — Z7901 Long term (current) use of anticoagulants: Secondary | ICD-10-CM | POA: Diagnosis not present

## 2024-03-31 DIAGNOSIS — R911 Solitary pulmonary nodule: Secondary | ICD-10-CM | POA: Diagnosis not present

## 2024-03-31 DIAGNOSIS — R1084 Generalized abdominal pain: Secondary | ICD-10-CM

## 2024-03-31 DIAGNOSIS — K5792 Diverticulitis of intestine, part unspecified, without perforation or abscess without bleeding: Secondary | ICD-10-CM | POA: Diagnosis not present

## 2024-03-31 LAB — COMPREHENSIVE METABOLIC PANEL WITH GFR
ALT: 18 U/L (ref 0–44)
AST: 22 U/L (ref 15–41)
Albumin: 3.7 g/dL (ref 3.5–5.0)
Alkaline Phosphatase: 57 U/L (ref 38–126)
Anion gap: 8 (ref 5–15)
BUN: 33 mg/dL — ABNORMAL HIGH (ref 8–23)
CO2: 26 mmol/L (ref 22–32)
Calcium: 8.7 mg/dL — ABNORMAL LOW (ref 8.9–10.3)
Chloride: 107 mmol/L (ref 98–111)
Creatinine, Ser: 1.77 mg/dL — ABNORMAL HIGH (ref 0.61–1.24)
GFR, Estimated: 36 mL/min — ABNORMAL LOW (ref >60)
Glucose, Bld: 104 mg/dL — ABNORMAL HIGH (ref 70–99)
Potassium: 4.1 mmol/L (ref 3.5–5.1)
Sodium: 141 mmol/L (ref 135–145)
Total Bilirubin: 0.6 mg/dL (ref 0.0–1.2)
Total Protein: 6.7 g/dL (ref 6.5–8.1)

## 2024-03-31 LAB — URINALYSIS, W/ REFLEX TO CULTURE (INFECTION SUSPECTED)
Bacteria, UA: NONE SEEN
Bilirubin Urine: NEGATIVE
Glucose, UA: NEGATIVE mg/dL
Hgb urine dipstick: NEGATIVE
Ketones, ur: NEGATIVE mg/dL
Leukocytes,Ua: NEGATIVE
Nitrite: NEGATIVE
Protein, ur: 30 mg/dL — AB
Specific Gravity, Urine: 1.033 — ABNORMAL HIGH (ref 1.005–1.030)
pH: 7 (ref 5.0–8.0)

## 2024-03-31 LAB — CBC WITH DIFFERENTIAL/PLATELET
Abs Immature Granulocytes: 0.01 10*3/uL (ref 0.00–0.07)
Basophils Absolute: 0 10*3/uL (ref 0.0–0.1)
Basophils Relative: 0 %
Eosinophils Absolute: 0.1 10*3/uL (ref 0.0–0.5)
Eosinophils Relative: 1 %
HCT: 36 % — ABNORMAL LOW (ref 39.0–52.0)
Hemoglobin: 11.8 g/dL — ABNORMAL LOW (ref 13.0–17.0)
Immature Granulocytes: 0 %
Lymphocytes Relative: 32 %
Lymphs Abs: 2.2 10*3/uL (ref 0.7–4.0)
MCH: 33.6 pg (ref 26.0–34.0)
MCHC: 32.8 g/dL (ref 30.0–36.0)
MCV: 102.6 fL — ABNORMAL HIGH (ref 80.0–100.0)
Monocytes Absolute: 0.4 10*3/uL (ref 0.1–1.0)
Monocytes Relative: 6 %
Neutro Abs: 4.3 10*3/uL (ref 1.7–7.7)
Neutrophils Relative %: 61 %
Platelets: 202 10*3/uL (ref 150–400)
RBC: 3.51 MIL/uL — ABNORMAL LOW (ref 4.22–5.81)
RDW: 12.9 % (ref 11.5–15.5)
WBC: 6.9 10*3/uL (ref 4.0–10.5)
nRBC: 0 % (ref 0.0–0.2)

## 2024-03-31 LAB — LIPASE, BLOOD: Lipase: 41 U/L (ref 11–51)

## 2024-03-31 MED ORDER — METAMUCIL SMOOTH TEXTURE 58.6 % PO POWD: 1.0000 | Freq: Three times a day (TID) | ORAL | 12 refills | Status: DC

## 2024-03-31 MED ORDER — IOHEXOL 300 MG/ML  SOLN
75.0000 mL | Freq: Once | INTRAMUSCULAR | Status: AC | PRN
  Administered 2024-03-31: 75 mL via INTRAVENOUS

## 2024-03-31 MED ORDER — SENNOSIDES-DOCUSATE SODIUM 8.6-50 MG PO TABS: 1.0000 | ORAL_TABLET | Freq: Every day | ORAL | 0 refills | Status: DC

## 2024-03-31 MED ORDER — POLYETHYLENE GLYCOL 3350 17 G PO PACK: 17.0000 g | PACK | Freq: Every day | ORAL | 0 refills | Status: DC

## 2024-03-31 NOTE — ED Triage Notes (Signed)
 Patient complains of abdominal pain. Started yesterday and has been off and on since then, woke patient up at 2am hurting. Denies any nausea and vomiting. Rates pain 7.

## 2024-03-31 NOTE — ED Provider Notes (Signed)
 [facility] EMERGENCY DEPARTMENT AT [facility] Provider Note   CSN: [id number] Arrival date & time: 03/31/24  0747     History  No chief complaint on file.   [name] [name] is a 89 y.o. male.  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  The patient states that he has had periumbilical abdominal pain since Friday.  He states that it is worsened since last night and woke him up from sleep this morning.  He states that it feels like a bloating type of pain.  Denies any fevers but states that he feels very cold.  Denies any nausea, vomiting or diarrhea.  States that he has been constipated and his last bowel movement was 2 days ago.  He states that he has been taking milk of magnesia as needed with some improvement.  He denies any black or bloody stools.  He denies any dysuria or hematuria.  The history is provided by the patient.       Home Medications Prior to Admission medications   Medication Sig Start Date End Date Taking? Authorizing Provider  polyethylene glycol (MIRALAX) 17 g packet Take 17 g by mouth daily. 03/31/24  Yes Nora Beal, Brendan Gruwell K, DO  psyllium (METAMUCIL SMOOTH TEXTURE) 58.6 % powder Take 1 packet by mouth 3 (three) times daily. 03/31/24  Yes Nora Beal, Kamare Caspers K, DO  senna-docusate (SENOKOT-S) 8.6-50 MG tablet Take 1 tablet by mouth daily. 03/31/24  Yes Nora Beal, Kelissa Merlin K, DO  amLODipine (NORVASC) 10 MG tablet Take 10 mg by mouth daily. 08/23/22   [provider]  carvedilol (COREG) 3.125 MG tablet Take 1 tablet (3.125 mg total) by mouth 2 (two) times daily. 09/15/23 02/08/24  Morey Ar, NP  ferrous sulfate 325 (65 FE) MG EC tablet Take 1 tablet (325 mg total) by mouth 2 (two) times daily with a meal. 09/29/22   Sumner Ends, MD  furosemide (LASIX) 40 MG tablet Take 1 tablet (40 mg total) by mouth  daily. 03/14/24   Avanell Leigh, MD  imatinib (GLEEVEC) 400 MG tablet Take 1 tablet (400 mg total) by mouth every other day. Take with meals and large glass of water.Caution:Chemotherapy. 02/16/24   Sumner Ends, MD  loperamide (IMODIUM) 2 MG capsule Take 1 capsule (2 mg total) by mouth 4 (four) times daily as needed for diarrhea or loose stools. 10/27/21   Conklin, Erica R, PA-C  potassium chloride SA (KLOR-CON M20) 20 MEQ tablet Take 1 tablet by mouth once daily 02/26/24   Sumner Ends, MD  simvastatin (ZOCOR) 20 MG tablet Take 20 mg by mouth daily. 10/21/20   [provider]  traZODone (DESYREL) 100 MG tablet  02/07/23   [provider]      Allergies    Patient has no known allergies.    Review of Systems   Review of Systems  Physical Exam Updated Vital Signs BP (!) 156/82   Pulse 86   Temp 97.6 F (36.4 C) (Oral)   Resp 20   SpO2 99%  Physical Exam Vitals and nursing note reviewed.  Constitutional:      General: He is not in acute distress.    Appearance: Normal appearance.  HENT:     Head: Normocephalic and atraumatic.     Nose: Nose normal.     Mouth/Throat:     Mouth: Mucous membranes are moist.  Pharynx: Oropharynx is clear.  Eyes:     Extraocular Movements: Extraocular movements intact.     Conjunctiva/sclera: Conjunctivae normal.  Cardiovascular:     Rate and Rhythm: Normal rate and regular rhythm.     Heart sounds: Normal heart sounds.  Pulmonary:     Effort: Pulmonary effort is normal.     Breath sounds: Normal breath sounds.  Abdominal:     General: Abdomen is flat. Bowel sounds are normal.     Palpations: Abdomen is soft.     Tenderness: There is abdominal tenderness (Mild, periumbilical). There is no guarding or rebound.  Musculoskeletal:        General: Normal range of motion.     Cervical back: Normal range of motion.  Skin:    General: Skin is warm and dry.  Neurological:     General: No focal deficit present.      Mental Status: He is alert and oriented to person, place, and time.  Psychiatric:        Mood and Affect: Mood normal.        Behavior: Behavior normal.     ED Results / Procedures / Treatments   Labs (all labs ordered are listed, but only abnormal results are displayed) Labs Reviewed  COMPREHENSIVE METABOLIC PANEL WITH GFR - Abnormal; Notable for the following components:      Result Value   Glucose, Bld 104 (*)    BUN 33 (*)    Creatinine, Ser 1.77 (*)    Calcium 8.7 (*)    GFR, Estimated 36 (*)    All other components within normal limits  CBC WITH DIFFERENTIAL/PLATELET - Abnormal; Notable for the following components:   RBC 3.51 (*)    Hemoglobin 11.8 (*)    HCT 36.0 (*)    MCV 102.6 (*)    All other components within normal limits  URINALYSIS, W/ REFLEX TO CULTURE (INFECTION SUSPECTED) - Abnormal; Notable for the following components:   Color, Urine STRAW (*)    Specific Gravity, Urine 1.033 (*)    Protein, ur 30 (*)    All other components within normal limits  LIPASE, BLOOD    EKG None  Radiology CT ABDOMEN PELVIS W CONTRAST Result Date: 03/31/2024 CLINICAL DATA:  Abdominal pain, acute, nonlocalized EXAM: CT ABDOMEN AND PELVIS WITH CONTRAST TECHNIQUE: Multidetector CT imaging of the abdomen and pelvis was performed using the standard protocol following bolus administration of intravenous contrast. RADIATION DOSE REDUCTION: This exam was performed according to the departmental dose-optimization program which includes automated exposure control, adjustment of the mA and/or kV according to patient size and/or use of iterative reconstruction technique. CONTRAST:  75mL OMNIPAQUE IOHEXOL 300 MG/ML  SOLN COMPARISON:  November 22, 2022 FINDINGS: Lower chest: Mild cardiomegaly.No pleural effusion or focal airspace consolidation. 7 mm ground-glass nodule in the Hepatobiliary: No mass.Single stone in the gallbladder neck. No wall thickening.No intrahepatic or extrahepatic biliary  ductal dilation.The portal veins are patent. Pancreas: No mass or main ductal dilation.No peripancreatic inflammation or fluid collection. Spleen: Normal size. No mass. Adrenals/Urinary Tract: No adrenal masses. A couple of subcentimeter hypodensities are noted in the kidneys, too small to definitively characterize, but likely small cysts. No renal mass. No nephrolithiasis or hydronephrosis. The urinary bladder is distended without focal abnormality. Stomach/Bowel: The stomach is decompressed without focal abnormality. Small bowel anastomosis in the right lower quadrant. No small bowel wall thickening or inflammation. No small bowel obstruction. Normal appendix. Descending and sigmoid colonic diverticulosis. No changes of acute  diverticulitis. Vascular/Lymphatic: No aortic aneurysm. Diffuse aortoiliac atherosclerosis. No intraabdominal or pelvic lymphadenopathy. Reproductive: No prostatomegaly.No free pelvic fluid. Other: No ascites. In the right lower quadrant omentum (axial 46), there is a 9 mm nodule. In the left upper quadrant omentum, there is a small region of nodularity measuring 1.6 cm (axial 33). Musculoskeletal: No acute fracture or destructive lesion.Diffuse osteopenia. Multilevel degenerative disc disease of the spine. Large right inguinal hernia containing intra-abdominal fat nonobstructive small bowel. Overall, this measures 12.8 cm in craniocaudal dimension. IMPRESSION: 1. A couple of small nodules have developed in the interim in the right lower quadrant omentum (axial 46), and the left lateral omentum (axial 33), measuring up to 1.6 cm. Given the appearance, these are worrisome for early changes of peritoneal carcinomatosis. 2. Descending and sigmoid colonic diverticulosis. No changes of acute diverticulitis. 3. Cholecystolithiasis.  No changes of acute cholecystitis. 4. New ground-glass pulmonary nodule in the right lower lobe, measuring 7 mm. While this could be infectious or inflammatory,  continued follow-up is recommended to exclude developing metastatic disease. Electronically Signed   By: Rance Burrows M.D.   On: 03/31/2024 11:08    Procedures Procedures    Medications Ordered in ED Medications  iohexol (OMNIPAQUE) 300 MG/ML solution 75 mL (75 mLs Intravenous Contrast Given 03/31/24 1023)    ED Course/ Medical Decision Making/ A&P Clinical Course as of 03/31/24 1231  Sun Mar 31, 2024  1028 Labs at baseline without acute abnormality. UA and CT pending. [VK]  1230 CTAP with omental nodules concerning for peritoneal carcinomatosis. Patient is established with oncology and recommended close follow up. Patient also requests bowel regimen for constipation. He is stable for discharge home. 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 The complaint involves an extensive differential diagnosis and also carries with it a high risk of complications and morbidity.    The differential diagnosis includes gastritis, GERD, gastroenteritis, obstruction, infection, malignancy  Additional history obtained: Additional history obtained from N/A Records reviewed outpatient cardiology and oncology records  ED Course and Reassessment: On patient's arrival he is hemodynamically stable in no acute distress.  Patient will have labs and CT abdomen and pelvis to evaluate for cause of his symptoms.  He declined any pain control at this time and will be closely reassessed.  Independent labs interpretation:  The following labs were independently interpreted: within normal range  Independent visualization of imaging: - I independently visualized the following imaging with scope of  interpretation limited to determining acute life threatening conditions related to emergency care: CTAP, which revealed omental nodules concerning for peritoneal carcinomatosis  Consultation: - Consulted or discussed management/test interpretation w/ external professional: N/A  Consideration for admission or further workup: Patient has no emergent conditions requiring admission or further work-up at this time and is stable for discharge home with primary care and oncology follow-up  Social Determinants of health: N/A    Amount and/or Complexity of Data Reviewed Labs: ordered. Radiology: ordered.  Risk OTC drugs. Prescription drug management.          Final Clinical Impression(s) / ED  Diagnoses Final diagnoses:  Nodule of intestine  Pulmonary nodule  Generalized abdominal pain  Constipation, unspecified constipation type    Rx / DC Orders ED Discharge Orders          Ordered    polyethylene glycol (MIRALAX) 17 g packet  Daily        03/31/24 1229    psyllium (METAMUCIL SMOOTH TEXTURE) 58.6 % powder  3 times daily        03/31/24 1229    senna-docusate (SENOKOT-S) 8.6-50 MG tablet  Daily        03/31/24 1229              Kingsley, Lashia Niese K, DO 03/31/24 1231

## 2024-03-31 NOTE — Discharge Instructions (Signed)
 You were seen in the emergency department for your constipation and abdominal pain. Your CT scan did show nodules in your abdomen that should be evaluated by your oncologist for possible cancer.  I have given you a bowel regimen of medications that you should take for your constipation.  You can take MiraLAX every day until you are having regular soft bowel movements daily, then can cut the dose in half until you are again having regular soft bowel movements daily and then can use as needed.  You should also take the Senokot daily to help keep your stools soft.  Metamucil is a fiber supplement that you should take daily to help prevent recurrent constipation.  You should make sure that you are drinking plenty of fluids and staying well-hydrated.  You can follow-up with your primary doctor in the next few days to have your symptoms rechecked.  You should return to the emergency department if you are having significantly worsening pain, start having repetitive vomiting, you have fevers or if you have any other new or concerning symptoms.

## 2024-04-01 ENCOUNTER — Telehealth: Payer: Self-pay | Admitting: Oncology

## 2024-04-01 ENCOUNTER — Other Ambulatory Visit (HOSPITAL_COMMUNITY): Payer: Self-pay

## 2024-04-01 ENCOUNTER — Encounter: Payer: Self-pay | Admitting: *Deleted

## 2024-04-01 ENCOUNTER — Other Ambulatory Visit: Payer: Self-pay | Admitting: Pharmacy Technician

## 2024-04-01 ENCOUNTER — Other Ambulatory Visit: Payer: Self-pay

## 2024-04-01 NOTE — Telephone Encounter (Signed)
 Patient has been scheduled. Aware of appt date and time.

## 2024-04-01 NOTE — Telephone Encounter (Signed)
-----   Message from Nurse Algis Anton F sent at 04/01/2024 11:57 AM EDT ----- Good afternoon patiens son called to schedule an hospital follow up. CALLBACK Number [phone])

## 2024-04-01 NOTE — Progress Notes (Signed)
 Specialty Pharmacy Refill Coordination Note  [name] [name] is a 89 y.o. male contacted today regarding refills of specialty medication(s) Imatinib Mesylate (GLEEVEC)   Patient requested Delivery   Delivery date: 04/05/24   Verified address: [address]  [place] [address]   Medication will be filled on 04/04/24.     Spoke to patient's son.

## 2024-04-02 DIAGNOSIS — R1033 Periumbilical pain: Secondary | ICD-10-CM | POA: Diagnosis not present

## 2024-04-02 DIAGNOSIS — G4700 Insomnia, unspecified: Secondary | ICD-10-CM | POA: Diagnosis not present

## 2024-04-02 DIAGNOSIS — K5900 Constipation, unspecified: Secondary | ICD-10-CM | POA: Diagnosis not present

## 2024-04-03 ENCOUNTER — Inpatient Hospital Stay: Attending: Oncology | Admitting: Oncology

## 2024-04-03 ENCOUNTER — Telehealth: Payer: Self-pay | Admitting: Oncology

## 2024-04-03 VITALS — BP 137/75 | HR 80 | Temp 98.1°F | Resp 18 | Ht 67.5 in | Wt 154.5 lb

## 2024-04-03 DIAGNOSIS — D6481 Anemia due to antineoplastic chemotherapy: Secondary | ICD-10-CM | POA: Insufficient documentation

## 2024-04-03 DIAGNOSIS — E538 Deficiency of other specified B group vitamins: Secondary | ICD-10-CM | POA: Insufficient documentation

## 2024-04-03 DIAGNOSIS — C49A9 Gastrointestinal stromal tumor of other sites: Secondary | ICD-10-CM | POA: Diagnosis not present

## 2024-04-03 DIAGNOSIS — Z8619 Personal history of other infectious and parasitic diseases: Secondary | ICD-10-CM | POA: Diagnosis not present

## 2024-04-03 DIAGNOSIS — R911 Solitary pulmonary nodule: Secondary | ICD-10-CM | POA: Diagnosis not present

## 2024-04-03 DIAGNOSIS — K668 Other specified disorders of peritoneum: Secondary | ICD-10-CM | POA: Insufficient documentation

## 2024-04-03 DIAGNOSIS — N289 Disorder of kidney and ureter, unspecified: Secondary | ICD-10-CM | POA: Insufficient documentation

## 2024-04-03 DIAGNOSIS — C49A3 Gastrointestinal stromal tumor of small intestine: Secondary | ICD-10-CM

## 2024-04-03 NOTE — Telephone Encounter (Signed)
 Patient has been scheduled for follow-up visit per 04/03/24 LOS.  Pt aware of scheduled appt details.

## 2024-04-03 NOTE — Progress Notes (Signed)
 [facility] OFFICE PROGRESS NOTE   Diagnosis: Gastrointestinal stromal tumor  INTERVAL HISTORY:   [name] [name] returns as scheduled.  He was seen in the emergency room 03/31/2024 with abdominal pain.  A CT revealed a few small peritoneal nodules and a new groundglass right lower lobe nodule.  He had been constipated. [name] [name] reports the pain has resolved.  He continues imatinib.  No diarrhea or swelling.  No complaint.  Objective:  Vital signs in last 24 hours:  Blood pressure 137/75, pulse 80, temperature 98.1 F (36.7 C), temperature source Temporal, resp. rate 18, height 5' 7.5" (1.715 m), weight 154 lb 8 oz (70.1 kg), SpO2 100%.    HEENT: No thrush or ulcers Resp: Lungs clear bilaterally Cardio: Regular rate and rhythm GI: Soft and nontender, no mass, no hepatosplenomegaly, right inguinal hernia Vascular: No leg edema   Portacath/PICC-without erythema  Lab Results:  Lab Results  Component Value Date   WBC 6.9 03/31/2024   HGB 11.8 (L) 03/31/2024   HCT 36.0 (L) 03/31/2024   MCV 102.6 (H) 03/31/2024   PLT 202 03/31/2024   NEUTROABS 4.3 03/31/2024    CMP  Lab Results  Component Value Date   NA 141 03/31/2024   K 4.1 03/31/2024   CL 107 03/31/2024   CO2 26 03/31/2024   GLUCOSE 104 (H) 03/31/2024   BUN 33 (H) 03/31/2024   CREATININE 1.77 (H) 03/31/2024   CALCIUM 8.7 (L) 03/31/2024   PROT 6.7 03/31/2024   ALBUMIN 3.7 03/31/2024   AST 22 03/31/2024   ALT 18 03/31/2024   ALKPHOS 57 03/31/2024   BILITOT 0.6 03/31/2024   GFRNONAA 36 (L) 03/31/2024   GFRAA 51 (L) 08/05/2020     Imaging:  CT ABDOMEN PELVIS W CONTRAST Result Date: 03/31/2024 CLINICAL DATA:  Abdominal pain, acute, nonlocalized EXAM: CT ABDOMEN AND PELVIS WITH CONTRAST TECHNIQUE: Multidetector CT imaging of the abdomen and pelvis was performed using the standard protocol following bolus administration of intravenous contrast. RADIATION DOSE REDUCTION: This exam was performed according  to the departmental dose-optimization program which includes automated exposure control, adjustment of the mA and/or kV according to patient size and/or use of iterative reconstruction technique. CONTRAST:  75mL OMNIPAQUE IOHEXOL 300 MG/ML  SOLN COMPARISON:  November 22, 2022 FINDINGS: Lower chest: Mild cardiomegaly.No pleural effusion or focal airspace consolidation. 7 mm ground-glass nodule in the Hepatobiliary: No mass.Single stone in the gallbladder neck. No wall thickening.No intrahepatic or extrahepatic biliary ductal dilation.The portal veins are patent. Pancreas: No mass or main ductal dilation.No peripancreatic inflammation or fluid collection. Spleen: Normal size. No mass. Adrenals/Urinary Tract: No adrenal masses. A couple of subcentimeter hypodensities are noted in the kidneys, too small to definitively characterize, but likely small cysts. No renal mass. No nephrolithiasis or hydronephrosis. The urinary bladder is distended without focal abnormality. Stomach/Bowel: The stomach is decompressed without focal abnormality. Small bowel anastomosis in the right lower quadrant. No small bowel wall thickening or inflammation. No small bowel obstruction. Normal appendix. Descending and sigmoid colonic diverticulosis. No changes of acute diverticulitis. Vascular/Lymphatic: No aortic aneurysm. Diffuse aortoiliac atherosclerosis. No intraabdominal or pelvic lymphadenopathy. Reproductive: No prostatomegaly.No free pelvic fluid. Other: No ascites. In the right lower quadrant omentum (axial 46), there is a 9 mm nodule. In the left upper quadrant omentum, there is a small region of nodularity measuring 1.6 cm (axial 33). Musculoskeletal: No acute fracture or destructive lesion.Diffuse osteopenia. Multilevel degenerative disc disease of the spine. Large right inguinal hernia containing intra-abdominal fat nonobstructive small  bowel. Overall, this measures 12.8 cm in craniocaudal dimension. IMPRESSION: 1. A couple of  small nodules have developed in the interim in the right lower quadrant omentum (axial 46), and the left lateral omentum (axial 33), measuring up to 1.6 cm. Given the appearance, these are worrisome for early changes of peritoneal carcinomatosis. 2. Descending and sigmoid colonic diverticulosis. No changes of acute diverticulitis. 3. Cholecystolithiasis.  No changes of acute cholecystitis. 4. New ground-glass pulmonary nodule in the right lower lobe, measuring 7 mm. While this could be infectious or inflammatory, continued follow-up is recommended to exclude developing metastatic disease. Electronically Signed   By: Wallie Char M.D.   On: 03/31/2024 11:08    Medications: I have reviewed the patient's current medications.   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 CT 10/16/2014 consistent with extensive carcinomatosis, CT-guided biopsy of an omental mass 10/21/2014 confirmed a gastrointestinal stromal tumor Initiation of Gleevec 10/31/2014 CT 01/13/2015 revealed improvement in the peritoneal and omental metastatic disease CT 05/05/2015 with no progression of omental/peritoneal metastatic disease, increased ascites, and appendix inflammation CT 07/07/2016-no abscess, mild perihepatic and left pericolic ascites and mesenteric edema CT of the pelvis 09/20/2016, no residual abscess, no ascites CT abdomen pelvis renal stones study 04/03/2017-partial small bowel obstruction, potentially related to right inguinal hernia CT abdomen/pelvis 08/18/2017-possible cholecystitis, 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  Cholelithiasis. CT renal stone study 06/04/2021-large right and small left inguinal hernias, no ascites CTs 11/22/2022-small bilateral pleural effusions small volume ascites, gallstones, large right inguinal hernia, borderline enlarged mediastinal nodes Gleevec 400 mg every other day beginning 12/22/2022 due to edema and pleural effusions CT abdomen/pelvis 03/31/2024-few new small omental nodules with a left lateral omental nodule measuring up to 1.6 cm.,  New groundglass right lower lobe nodule     2. History of anemia, status post a nondiagnostic bone marrow biopsy 10/21/2014   3. NSTEMI March 2014   4. Admission 05/06/2015 with acute onset right abdomen pain-potentially related to acute appendicitis versus pain from carcinomatosis CT 05/13/2015 consistent with a right lower abdomen abscess, status post catheter drainage by interventional radiology 05/13/2015 Follow-up CT 05/19/2015 showed resolution of the abscess. Follow-up evaluation in interventional radiology 06/01/2015 showed the abscess cavity had resolved. The abscess drainage catheter communicated with the cecum via the appendix. The catheter was left in place. CT abdomen/pelvis 06/08/2015 showed the right pelvic drain in place without recurrent or residual surrounding fluid collection. Peritoneal metastasis with slight increase in small to moderate volume of abdominal pelvic ascites. Drainage catheter injection 06/12/2015 showed residual tiny fistulous connection with the end of the decompressed abscess cavity within the right lower abdominal quadrant and the residual lumen of the appendix. Paracentesis 06/12/2015 with 5 liters of fluid removed Paracentesis 06/19/2015-negative cytology and culture Removal of abscess drainage catheter 06/23/2015 CT 07/26/2015 with increased right abdominal wall inflammation and increased fluid collection at site of previous right abdomen abscess, ascites, and enlargement/inflammation of the  appendix Placement of right lower quadrant percutaneous drainage catheter 07/27/2015. Contrast injection confirmed persistent fistulous connection with the ill-defined fluid collection and the residual appendix and cecum. Paracentesis with 2 L of fluid removed. Follow-up CT abdomen/pelvis 08/05/2015 with small to moderate left pleural effusion  and moderate to large volume ascites with both appearing slightly decreased since the prior study. Right lower quadrant drainage catheter in place. No residual fluid collection around the drain or in the adjacent abdomen or pelvis. 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 Drainage catheter left in place. 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 Lauralee Poll     5. C. difficile colitis 05/10/2015, Tober 2019   6.  Right leg edema 12/18/2015. Negative venous Doppler.   7.  Renal insufficiency   8.  Urinary retention June 2022-temporary Foley catheter placed, started on Flomax   9.  B12 deficiency-oral B12 initiated 12/15/2021   10.  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  He presented to the emergency room last week with abdominal pain.  The pain may have been related to constipation.  He reports abdominal pain has resolved. A CT reveals a few new small peritoneal nodules.  I doubt he has symptoms related to these nodules.  The lung nodule is most likely inflammatory.  [name] [name]  will continue Gleevec at the current dose.  He will return for an office and lab visit in.  He will call for recurrent pain or new symptoms.  I reviewed the CT images with [name] [name] and his son.  Coni Deep, MD  04/03/2024  8:37 AM

## 2024-04-04 ENCOUNTER — Other Ambulatory Visit: Payer: Self-pay

## 2024-04-23 ENCOUNTER — Other Ambulatory Visit: Payer: Self-pay

## 2024-04-23 ENCOUNTER — Other Ambulatory Visit: Payer: Self-pay | Admitting: Oncology

## 2024-04-23 DIAGNOSIS — C49A3 Gastrointestinal stromal tumor of small intestine: Secondary | ICD-10-CM

## 2024-04-23 NOTE — Progress Notes (Signed)
 Specialty Pharmacy Ongoing Clinical Assessment Note  [name] [name] is a 89 y.o. male who is being followed by the specialty pharmacy service for RxSp Oncology   Patient's specialty medication(s) reviewed today: Imatinib  Mesylate (GLEEVEC )   Missed doses in the last 4 weeks: 0   Patient/Caregiver did not have any additional questions or concerns.   Therapeutic benefit summary: Patient is achieving benefit   Adverse events/side effects summary: No adverse events/side effects   Patient's therapy is appropriate to: Continue    Goals Addressed             This Visit's Progress    Slow Disease Progression       Patient is on track. Patient will maintain adherence. Per chart note from 4/16, small nodules seen on recent CT which provider believes to be inflammatory. No dose or regimen change needed at this time for Gleevec .          Follow up:  6 months  Alexzandra Bilton M Micaila Ziemba Specialty Pharmacist

## 2024-04-23 NOTE — Progress Notes (Signed)
 Specialty Pharmacy Refill Coordination Note  [name] is a 89 y.o. male contacted today regarding refills of specialty medication(s) Imatinib  Mesylate (GLEEVEC )   Patient requested Delivery   Delivery date: 05/03/24   Verified address: [address]  [place] [address]   Medication will be filled on 05/02/24.

## 2024-04-24 ENCOUNTER — Other Ambulatory Visit: Payer: Self-pay

## 2024-04-24 MED ORDER — IMATINIB MESYLATE 400 MG PO TABS
400.0000 mg | ORAL_TABLET | ORAL | 1 refills | Status: DC
  Filled 2024-04-24: qty 15, 30d supply, fill #0
  Filled 2024-05-29 – 2024-06-03 (×2): qty 15, 30d supply, fill #1
  Filled 2024-06-25: qty 15, 30d supply, fill #2
  Filled 2024-07-18 – 2024-07-24 (×3): qty 15, 30d supply, fill #3

## 2024-05-02 ENCOUNTER — Other Ambulatory Visit (HOSPITAL_COMMUNITY): Payer: Self-pay

## 2024-05-02 ENCOUNTER — Other Ambulatory Visit: Payer: Self-pay

## 2024-05-02 NOTE — Progress Notes (Signed)
 Changed refill recurrence to 28 day supply. Have been feeling for 28 days for the previous fills due to insurance restrictions.

## 2024-05-07 ENCOUNTER — Other Ambulatory Visit: Payer: Medicare Other

## 2024-05-07 ENCOUNTER — Ambulatory Visit: Payer: Medicare Other | Admitting: Oncology

## 2024-05-17 DIAGNOSIS — L570 Actinic keratosis: Secondary | ICD-10-CM | POA: Diagnosis not present

## 2024-05-17 DIAGNOSIS — X32XXXD Exposure to sunlight, subsequent encounter: Secondary | ICD-10-CM | POA: Diagnosis not present

## 2024-05-29 ENCOUNTER — Other Ambulatory Visit: Payer: Self-pay

## 2024-05-29 ENCOUNTER — Other Ambulatory Visit (HOSPITAL_COMMUNITY): Payer: Self-pay

## 2024-05-30 ENCOUNTER — Other Ambulatory Visit: Payer: Self-pay

## 2024-06-03 ENCOUNTER — Other Ambulatory Visit: Payer: Self-pay | Admitting: Pharmacy Technician

## 2024-06-03 ENCOUNTER — Other Ambulatory Visit: Payer: Self-pay

## 2024-06-03 NOTE — Progress Notes (Signed)
 Specialty Pharmacy Refill Coordination Note  [name] [name] is a 89 y.o. male contacted today regarding refills of specialty medication(s) Imatinib  Mesylate (GLEEVEC )   Patient requested Delivery   Delivery date: 06/04/24   Verified address: [address]   [name] [address]   Medication will be filled on 06/03/24.

## 2024-06-17 ENCOUNTER — Inpatient Hospital Stay (HOSPITAL_BASED_OUTPATIENT_CLINIC_OR_DEPARTMENT_OTHER): Admitting: Oncology

## 2024-06-17 ENCOUNTER — Inpatient Hospital Stay: Attending: Oncology

## 2024-06-17 VITALS — BP 164/62 | HR 77 | Temp 98.0°F | Resp 18 | Ht 67.0 in | Wt 156.1 lb

## 2024-06-17 DIAGNOSIS — C49A3 Gastrointestinal stromal tumor of small intestine: Secondary | ICD-10-CM | POA: Insufficient documentation

## 2024-06-17 DIAGNOSIS — D6481 Anemia due to antineoplastic chemotherapy: Secondary | ICD-10-CM | POA: Diagnosis not present

## 2024-06-17 DIAGNOSIS — Z8619 Personal history of other infectious and parasitic diseases: Secondary | ICD-10-CM | POA: Diagnosis not present

## 2024-06-17 DIAGNOSIS — E538 Deficiency of other specified B group vitamins: Secondary | ICD-10-CM | POA: Diagnosis not present

## 2024-06-17 DIAGNOSIS — I252 Old myocardial infarction: Secondary | ICD-10-CM | POA: Diagnosis not present

## 2024-06-17 DIAGNOSIS — R918 Other nonspecific abnormal finding of lung field: Secondary | ICD-10-CM | POA: Insufficient documentation

## 2024-06-17 DIAGNOSIS — N289 Disorder of kidney and ureter, unspecified: Secondary | ICD-10-CM | POA: Diagnosis not present

## 2024-06-17 DIAGNOSIS — C786 Secondary malignant neoplasm of retroperitoneum and peritoneum: Secondary | ICD-10-CM | POA: Diagnosis not present

## 2024-06-17 LAB — CBC WITH DIFFERENTIAL (CANCER CENTER ONLY)
Abs Immature Granulocytes: 0.01 10*3/uL (ref 0.00–0.07)
Basophils Absolute: 0 10*3/uL (ref 0.0–0.1)
Basophils Relative: 0 %
Eosinophils Absolute: 0.1 10*3/uL (ref 0.0–0.5)
Eosinophils Relative: 2 %
HCT: 32.1 % — ABNORMAL LOW (ref 39.0–52.0)
Hemoglobin: 10.8 g/dL — ABNORMAL LOW (ref 13.0–17.0)
Immature Granulocytes: 0 %
Lymphocytes Relative: 29 %
Lymphs Abs: 1.6 10*3/uL (ref 0.7–4.0)
MCH: 34.1 pg — ABNORMAL HIGH (ref 26.0–34.0)
MCHC: 33.6 g/dL (ref 30.0–36.0)
MCV: 101.3 fL — ABNORMAL HIGH (ref 80.0–100.0)
Monocytes Absolute: 0.4 10*3/uL (ref 0.1–1.0)
Monocytes Relative: 8 %
Neutro Abs: 3.3 10*3/uL (ref 1.7–7.7)
Neutrophils Relative %: 61 %
Platelet Count: 194 10*3/uL (ref 150–400)
RBC: 3.17 MIL/uL — ABNORMAL LOW (ref 4.22–5.81)
RDW: 12.6 % (ref 11.5–15.5)
WBC Count: 5.5 10*3/uL (ref 4.0–10.5)
nRBC: 0 % (ref 0.0–0.2)

## 2024-06-17 LAB — CMP (CANCER CENTER ONLY)
ALT: 16 U/L (ref 0–44)
AST: 24 U/L (ref 15–41)
Albumin: 4 g/dL (ref 3.5–5.0)
Alkaline Phosphatase: 66 U/L (ref 38–126)
Anion gap: 9 (ref 5–15)
BUN: 31 mg/dL — ABNORMAL HIGH (ref 8–23)
CO2: 25 mmol/L (ref 22–32)
Calcium: 9 mg/dL (ref 8.9–10.3)
Chloride: 109 mmol/L (ref 98–111)
Creatinine: 1.98 mg/dL — ABNORMAL HIGH (ref 0.61–1.24)
GFR, Estimated: 31 mL/min — ABNORMAL LOW (ref >60)
Glucose, Bld: 105 mg/dL — ABNORMAL HIGH (ref 70–99)
Potassium: 4.3 mmol/L (ref 3.5–5.1)
Sodium: 143 mmol/L (ref 135–145)
Total Bilirubin: 0.3 mg/dL (ref 0.0–1.2)
Total Protein: 6.6 g/dL (ref 6.5–8.1)

## 2024-06-17 LAB — MAGNESIUM: Magnesium: 2.3 mg/dL (ref 1.7–2.4)

## 2024-06-17 NOTE — Progress Notes (Signed)
 [facility] OFFICE PROGRESS NOTE   Diagnosis: Gastrointestinal stromal tumor  INTERVAL HISTORY:   [name] [name] returns as scheduled.  He continues Gleevec .  No diarrhea, rash, or swelling.  No recurrent abdominal pain.  No complaint.  Objective:  Vital signs in last 24 hours:  Blood pressure (!) 164/62, pulse 77, temperature 98 F (36.7 C), temperature source Temporal, resp. rate 18, height 5' 7 (1.702 m), weight 156 lb 1.6 oz (70.8 kg), SpO2 100%.    HEENT: No thrush or ulcers Resp: Lungs clear bilaterally Cardio: Regular rhythm with premature beats GI: No apparent ascites, no mass, nontender, no hepatosplenomegaly, right inguinal hernia Vascular: No leg edema   Lab Results:  Lab Results  Component Value Date   WBC 5.5 06/17/2024   HGB 10.8 (L) 06/17/2024   HCT 32.1 (L) 06/17/2024   MCV 101.3 (H) 06/17/2024   PLT 194 06/17/2024   NEUTROABS 3.3 06/17/2024    CMP  Lab Results  Component Value Date   NA 143 06/17/2024   K 4.3 06/17/2024   CL 109 06/17/2024   CO2 25 06/17/2024   GLUCOSE 105 (H) 06/17/2024   BUN 31 (H) 06/17/2024   CREATININE 1.98 (H) 06/17/2024   CALCIUM  9.0 06/17/2024   PROT 6.6 06/17/2024   ALBUMIN 4.0 06/17/2024   AST 24 06/17/2024   ALT 16 06/17/2024   ALKPHOS 66 06/17/2024   BILITOT 0.3 06/17/2024   GFRNONAA 31 (L) 06/17/2024   GFRAA 51 (L) 08/05/2020    Medications: I have reviewed the patient's current medications.   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 CT 10/16/2014 consistent with extensive carcinomatosis, CT-guided biopsy of an omental mass 10/21/2014 confirmed a gastrointestinal stromal tumor Initiation of Gleevec  10/31/2014 CT 01/13/2015 revealed improvement in the peritoneal and omental metastatic disease CT 05/05/2015 with no progression of omental/peritoneal metastatic disease, increased ascites, and appendix inflammation CT 07/07/2016-no  abscess, mild perihepatic and left pericolic ascites and mesenteric edema CT of the pelvis 09/20/2016, no residual abscess, no ascites CT abdomen pelvis renal stones study 04/03/2017-partial small bowel obstruction, potentially related to right inguinal hernia CT abdomen/pelvis 08/18/2017-possible cholecystitis, 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  Cholelithiasis. CT renal stone study 06/04/2021-large right and small left inguinal hernias, no ascites CTs 11/22/2022-small bilateral pleural effusions small volume ascites, gallstones, large right inguinal hernia, borderline enlarged mediastinal nodes Gleevec  400 mg every other day beginning 12/22/2022 due to edema and pleural effusions CT abdomen/pelvis 03/31/2024-few new small omental nodules with a left lateral omental nodule measuring up to 1.6 cm.,  New groundglass right lower lobe nodule     2. History of anemia, status post a nondiagnostic bone marrow biopsy 10/21/2014   3. NSTEMI March 2014   4. Admission 05/06/2015 with acute onset right abdomen pain-potentially related to acute appendicitis versus pain from carcinomatosis CT 05/13/2015 consistent with a right lower abdomen abscess, status post catheter drainage by interventional radiology 05/13/2015 Follow-up CT 05/19/2015 showed resolution of the abscess. Follow-up evaluation in interventional radiology 06/01/2015 showed the abscess cavity had resolved. The abscess drainage catheter communicated with the cecum via the appendix. The catheter was left in place. CT abdomen/pelvis 06/08/2015 showed the right pelvic drain in place  without recurrent or  residual surrounding fluid collection. Peritoneal metastasis with slight increase in small to moderate volume of abdominal pelvic ascites. Drainage catheter injection 06/12/2015 showed residual tiny fistulous connection with the end of the decompressed abscess cavity within the right lower abdominal quadrant and the residual lumen of the appendix. Paracentesis 06/12/2015 with 5 liters of fluid removed Paracentesis 06/19/2015-negative cytology and culture Removal of abscess drainage catheter 06/23/2015 CT 07/26/2015 with increased right abdominal wall inflammation and increased fluid collection at site of previous right abdomen abscess, ascites, and enlargement/inflammation of the appendix Placement of right lower quadrant percutaneous drainage catheter 07/27/2015. Contrast injection confirmed persistent fistulous connection with the ill-defined fluid collection and the residual appendix and cecum. Paracentesis with 2 L of fluid removed. Follow-up CT abdomen/pelvis 08/05/2015 with small to moderate left pleural effusion and moderate to large volume ascites with both appearing slightly decreased since the prior study. Right lower quadrant drainage catheter in place. No residual fluid collection around the drain or in the adjacent abdomen or pelvis. 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 Drainage catheter left in place. 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 Gail     5. C. difficile colitis 05/10/2015, Tober 2019   6.  Right leg edema 12/18/2015. Negative venous  Doppler.   7.  Renal insufficiency   8.  Urinary retention June 2022-temporary Foley catheter placed, started on Flomax    9.  B12 deficiency-oral B12 initiated 12/15/2021   10.  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  There is no clinical evidence of disease progression.  He is tolerating the Gleevec  well.  He will continue Gleevec  on the current schedule. [name] [name] will return for an office and lab visit in 3 months.  Arley Hof, MD  06/17/2024  11:29 AM

## 2024-06-24 ENCOUNTER — Other Ambulatory Visit: Payer: Self-pay | Admitting: Oncology

## 2024-06-24 DIAGNOSIS — C49A3 Gastrointestinal stromal tumor of small intestine: Secondary | ICD-10-CM

## 2024-06-24 DIAGNOSIS — J90 Pleural effusion, not elsewhere classified: Secondary | ICD-10-CM

## 2024-06-25 ENCOUNTER — Other Ambulatory Visit: Payer: Self-pay

## 2024-06-25 NOTE — Progress Notes (Signed)
 Specialty Pharmacy Refill Coordination Note  [name] [name] is a 89 y.o. male contacted today regarding refills of specialty medication(s) Imatinib  Mesylate (GLEEVEC )   Patient requested Delivery   Delivery date: 06/28/24   Verified address: [address]   [name] [address]   Medication will be filled on 06/27/24.

## 2024-06-27 ENCOUNTER — Other Ambulatory Visit: Payer: Self-pay

## 2024-07-16 ENCOUNTER — Other Ambulatory Visit (HOSPITAL_COMMUNITY): Payer: Self-pay

## 2024-07-17 ENCOUNTER — Other Ambulatory Visit: Payer: Self-pay

## 2024-07-18 ENCOUNTER — Other Ambulatory Visit: Payer: Self-pay | Admitting: Pharmacy Technician

## 2024-07-18 ENCOUNTER — Other Ambulatory Visit: Payer: Self-pay

## 2024-07-18 NOTE — Progress Notes (Signed)
 Specialty Pharmacy Refill Coordination Note  [name] [name] is a 89 y.o. male contacted today regarding refills of specialty medication(s) Imatinib  Mesylate (GLEEVEC )   Patient requested Delivery   Delivery date: 07/25/24   Verified address: [address]   [name] [address]   Medication will be filled on 07/24/24.

## 2024-07-23 ENCOUNTER — Other Ambulatory Visit: Payer: Self-pay

## 2024-07-24 ENCOUNTER — Encounter: Payer: Self-pay | Admitting: Oncology

## 2024-07-24 ENCOUNTER — Other Ambulatory Visit: Payer: Self-pay

## 2024-07-24 NOTE — Progress Notes (Signed)
 Called & Spoke with patient about new shipping date due to order [facility] [phone] this NDC makes patient copay $21.45 & that's what he has been paying. Sandi aware & ordering. Will Update patient if any changes.

## 2024-07-25 ENCOUNTER — Other Ambulatory Visit: Payer: Self-pay

## 2024-08-07 DIAGNOSIS — Z6825 Body mass index (BMI) 25.0-25.9, adult: Secondary | ICD-10-CM | POA: Diagnosis not present

## 2024-08-07 DIAGNOSIS — J029 Acute pharyngitis, unspecified: Secondary | ICD-10-CM | POA: Diagnosis not present

## 2024-08-13 DIAGNOSIS — Z0000 Encounter for general adult medical examination without abnormal findings: Secondary | ICD-10-CM | POA: Diagnosis not present

## 2024-08-13 DIAGNOSIS — I10 Essential (primary) hypertension: Secondary | ICD-10-CM | POA: Diagnosis not present

## 2024-08-13 DIAGNOSIS — I5032 Chronic diastolic (congestive) heart failure: Secondary | ICD-10-CM | POA: Diagnosis not present

## 2024-08-13 DIAGNOSIS — I2510 Atherosclerotic heart disease of native coronary artery without angina pectoris: Secondary | ICD-10-CM | POA: Diagnosis not present

## 2024-08-13 DIAGNOSIS — E782 Mixed hyperlipidemia: Secondary | ICD-10-CM | POA: Diagnosis not present

## 2024-08-13 DIAGNOSIS — C49A3 Gastrointestinal stromal tumor of small intestine: Secondary | ICD-10-CM | POA: Diagnosis not present

## 2024-08-13 DIAGNOSIS — Z1331 Encounter for screening for depression: Secondary | ICD-10-CM | POA: Diagnosis not present

## 2024-08-13 DIAGNOSIS — N1832 Chronic kidney disease, stage 3b: Secondary | ICD-10-CM | POA: Diagnosis not present

## 2024-08-16 ENCOUNTER — Other Ambulatory Visit: Payer: Self-pay | Admitting: Oncology

## 2024-08-16 ENCOUNTER — Other Ambulatory Visit (HOSPITAL_COMMUNITY): Payer: Self-pay

## 2024-08-16 ENCOUNTER — Other Ambulatory Visit: Payer: Self-pay

## 2024-08-16 DIAGNOSIS — C49A3 Gastrointestinal stromal tumor of small intestine: Secondary | ICD-10-CM

## 2024-08-16 MED ORDER — IMATINIB MESYLATE 400 MG PO TABS
400.0000 mg | ORAL_TABLET | ORAL | 1 refills | Status: DC
  Filled 2024-08-16: qty 30, 60d supply, fill #0
  Filled 2024-08-21: qty 15, 30d supply, fill #0
  Filled 2024-09-24 – 2024-09-27 (×2): qty 15, 30d supply, fill #1
  Filled 2024-10-17: qty 15, 30d supply, fill #2
  Filled 2024-11-19: qty 15, 30d supply, fill #3

## 2024-08-21 ENCOUNTER — Other Ambulatory Visit (HOSPITAL_COMMUNITY): Payer: Self-pay

## 2024-08-21 ENCOUNTER — Other Ambulatory Visit: Payer: Self-pay

## 2024-08-21 NOTE — Progress Notes (Signed)
 Specialty Pharmacy Refill Coordination Note  [name] [name] is a 89 y.o. male contacted today regarding refills of specialty medication(s) Imatinib  Mesylate (GLEEVEC )   Patient requested Delivery   Delivery date: 08/29/24   Verified address: [address]   [place] [address]   Medication will be filled on 09.10.25.

## 2024-08-28 ENCOUNTER — Other Ambulatory Visit: Payer: Self-pay

## 2024-09-02 ENCOUNTER — Other Ambulatory Visit: Payer: Self-pay | Admitting: Student

## 2024-09-02 DIAGNOSIS — I2510 Atherosclerotic heart disease of native coronary artery without angina pectoris: Secondary | ICD-10-CM

## 2024-09-02 DIAGNOSIS — I10 Essential (primary) hypertension: Secondary | ICD-10-CM

## 2024-09-17 ENCOUNTER — Ambulatory Visit

## 2024-09-17 ENCOUNTER — Other Ambulatory Visit: Payer: Self-pay

## 2024-09-17 ENCOUNTER — Inpatient Hospital Stay (HOSPITAL_BASED_OUTPATIENT_CLINIC_OR_DEPARTMENT_OTHER): Admitting: Oncology

## 2024-09-17 ENCOUNTER — Inpatient Hospital Stay: Attending: Oncology

## 2024-09-17 ENCOUNTER — Inpatient Hospital Stay

## 2024-09-17 VITALS — BP 155/72 | HR 78 | Temp 97.9°F | Resp 16 | Wt 156.1 lb

## 2024-09-17 DIAGNOSIS — C49A3 Gastrointestinal stromal tumor of small intestine: Secondary | ICD-10-CM | POA: Diagnosis not present

## 2024-09-17 DIAGNOSIS — C786 Secondary malignant neoplasm of retroperitoneum and peritoneum: Secondary | ICD-10-CM | POA: Diagnosis not present

## 2024-09-17 DIAGNOSIS — Z23 Encounter for immunization: Secondary | ICD-10-CM

## 2024-09-17 DIAGNOSIS — E538 Deficiency of other specified B group vitamins: Secondary | ICD-10-CM | POA: Diagnosis not present

## 2024-09-17 DIAGNOSIS — Z8619 Personal history of other infectious and parasitic diseases: Secondary | ICD-10-CM | POA: Insufficient documentation

## 2024-09-17 DIAGNOSIS — N289 Disorder of kidney and ureter, unspecified: Secondary | ICD-10-CM | POA: Diagnosis not present

## 2024-09-17 DIAGNOSIS — D649 Anemia, unspecified: Secondary | ICD-10-CM | POA: Diagnosis not present

## 2024-09-17 DIAGNOSIS — I252 Old myocardial infarction: Secondary | ICD-10-CM | POA: Insufficient documentation

## 2024-09-17 LAB — CBC WITH DIFFERENTIAL (CANCER CENTER ONLY)
Abs Immature Granulocytes: 0.05 K/uL (ref 0.00–0.07)
Basophils Absolute: 0.1 K/uL (ref 0.0–0.1)
Basophils Relative: 1 %
Eosinophils Absolute: 2.1 K/uL — ABNORMAL HIGH (ref 0.0–0.5)
Eosinophils Relative: 22 %
HCT: 35.2 % — ABNORMAL LOW (ref 39.0–52.0)
Hemoglobin: 11.8 g/dL — ABNORMAL LOW (ref 13.0–17.0)
Immature Granulocytes: 1 %
Lymphocytes Relative: 23 %
Lymphs Abs: 2.2 K/uL (ref 0.7–4.0)
MCH: 34.3 pg — ABNORMAL HIGH (ref 26.0–34.0)
MCHC: 33.5 g/dL (ref 30.0–36.0)
MCV: 102.3 fL — ABNORMAL HIGH (ref 80.0–100.0)
Monocytes Absolute: 0.6 K/uL (ref 0.1–1.0)
Monocytes Relative: 6 %
Neutro Abs: 4.3 K/uL (ref 1.7–7.7)
Neutrophils Relative %: 47 %
Platelet Count: 230 K/uL (ref 150–400)
RBC: 3.44 MIL/uL — ABNORMAL LOW (ref 4.22–5.81)
RDW: 12.8 % (ref 11.5–15.5)
WBC Count: 9.3 K/uL (ref 4.0–10.5)
nRBC: 0 % (ref 0.0–0.2)

## 2024-09-17 LAB — CMP (CANCER CENTER ONLY)
ALT: 16 U/L (ref 0–44)
AST: 27 U/L (ref 15–41)
Albumin: 4.4 g/dL (ref 3.5–5.0)
Alkaline Phosphatase: 73 U/L (ref 38–126)
Anion gap: 12 (ref 5–15)
BUN: 26 mg/dL — ABNORMAL HIGH (ref 8–23)
CO2: 26 mmol/L (ref 22–32)
Calcium: 9.5 mg/dL (ref 8.9–10.3)
Chloride: 106 mmol/L (ref 98–111)
Creatinine: 1.79 mg/dL — ABNORMAL HIGH (ref 0.61–1.24)
GFR, Estimated: 35 mL/min — ABNORMAL LOW (ref >60)
Glucose, Bld: 102 mg/dL — ABNORMAL HIGH (ref 70–99)
Potassium: 4.1 mmol/L (ref 3.5–5.1)
Sodium: 144 mmol/L (ref 135–145)
Total Bilirubin: 0.4 mg/dL (ref 0.0–1.2)
Total Protein: 7.4 g/dL (ref 6.5–8.1)

## 2024-09-17 MED ORDER — INFLUENZA VAC SPLIT HIGH-DOSE 0.5 ML IM SUSY: 0.5000 mL | PREFILLED_SYRINGE | Freq: Once | INTRAMUSCULAR | Status: DC

## 2024-09-17 MED ORDER — INFLUENZA VAC SPLIT HIGH-DOSE 0.5 ML IM SUSY
0.5000 mL | PREFILLED_SYRINGE | Freq: Once | INTRAMUSCULAR | Status: AC
  Administered 2024-09-17: 0.5 mL via INTRAMUSCULAR
  Filled 2024-09-17: qty 0.5

## 2024-09-17 NOTE — Progress Notes (Signed)
  [facility]ESS [facility]   Diagnosis: Gastrointestinal stromal tumor  INTERVAL HISTORY:   [name] returns as scheduled.  He continues Gleevec .  No rash, diarrhea, or swelling.  No complaint.  Objective:  Vital signs in last 24 hours:  Blood pressure (!) 155/72, pulse 78, temperature 97.9 F (36.6 C), temperature source Temporal, resp. rate 16, weight 156 lb 1.6 oz (70.8 kg), SpO2 98%.    HEENT: No thrush or ulcers Resp: Lungs clear bilaterally Cardio: Regular rate and rhythm with premature beats GI: No hepatosplenomegaly, no mass, nontender, right inguinal hernia Vascular: The right lower leg is slightly larger than the left side, no edema  Skin: No rash   Lab Results:  Lab Results  Component Value Date   WBC 9.3 09/17/2024   HGB 11.8 (L) 09/17/2024   HCT 35.2 (L) 09/17/2024   MCV 102.3 (H) 09/17/2024   PLT 230 09/17/2024   NEUTROABS 4.3 09/17/2024    CMP  Lab Results  Component Value Date   NA 144 09/17/2024   K 4.1 09/17/2024   CL 106 09/17/2024   CO2 26 09/17/2024   GLUCOSE 102 (H) 09/17/2024   BUN 26 (H) 09/17/2024   CREATININE 1.79 (H) 09/17/2024   CALCIUM  9.5 09/17/2024   PROT 7.4 09/17/2024   ALBUMIN 4.4 09/17/2024   AST 27 09/17/2024   ALT 16 09/17/2024   ALKPHOS 73 09/17/2024   BILITOT 0.4 09/17/2024   GFRNONAA 35 (L) 09/17/2024   GFRAA 51 (L) 08/05/2020     Medications: I have reviewed the patient's current medications.   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 CT 10/16/2014 consistent with extensive carcinomatosis, CT-guided biopsy of an omental mass 10/21/2014 confirmed a gastrointestinal stromal tumor Initiation of Gleevec  10/31/2014 CT 01/13/2015 revealed improvement in the peritoneal and omental metastatic disease CT 05/05/2015 with no progression of omental/peritoneal metastatic disease, increased ascites, and appendix inflammation CT  07/07/2016-no abscess, mild perihepatic and left pericolic ascites and mesenteric edema CT of the pelvis 09/20/2016, no residual abscess, no ascites CT abdomen pelvis renal stones study 04/03/2017-partial small bowel obstruction, potentially related to right inguinal hernia CT abdomen/pelvis 08/18/2017-possible cholecystitis, 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  Cholelithiasis. CT renal stone study 06/04/2021-large right and small left inguinal hernias, no ascites CTs 11/22/2022-small bilateral pleural effusions small volume ascites, gallstones, large right inguinal hernia, borderline enlarged mediastinal nodes Gleevec  400 mg every other day beginning 12/22/2022 due to edema and pleural effusions CT abdomen/pelvis 03/31/2024-few new small omental nodules with a left lateral omental nodule measuring up to 1.6 cm.,  New groundglass right lower lobe nodule     2. History of anemia, status post a nondiagnostic bone marrow biopsy 10/21/2014   3. NSTEMI March 2014   4. Admission 05/06/2015 with acute onset right abdomen pain-potentially related to acute appendicitis versus pain from carcinomatosis CT 05/13/2015 consistent with a right lower abdomen abscess, status post catheter drainage by interventional radiology 05/13/2015 Follow-up CT 05/19/2015 showed resolution of the abscess. Follow-up evaluation in interventional radiology 06/01/2015 showed the abscess cavity had resolved. The abscess drainage catheter communicated with the cecum via the appendix. The catheter was left in place. CT abdomen/pelvis 06/08/2015 showed the right  pelvic drain in place without  recurrent or residual surrounding fluid collection. Peritoneal metastasis with slight increase in small to moderate volume of abdominal pelvic ascites. Drainage catheter injection 06/12/2015 showed residual tiny fistulous connection with the end of the decompressed abscess cavity within the right lower abdominal quadrant and the residual lumen of the appendix. Paracentesis 06/12/2015 with 5 liters of fluid removed Paracentesis 06/19/2015-negative cytology and culture Removal of abscess drainage catheter 06/23/2015 CT 07/26/2015 with increased right abdominal wall inflammation and increased fluid collection at site of previous right abdomen abscess, ascites, and enlargement/inflammation of the appendix Placement of right lower quadrant percutaneous drainage catheter 07/27/2015. Contrast injection confirmed persistent fistulous connection with the ill-defined fluid collection and the residual appendix and cecum. Paracentesis with 2 L of fluid removed. Follow-up CT abdomen/pelvis 08/05/2015 with small to moderate left pleural effusion and moderate to large volume ascites with both appearing slightly decreased since the prior study. Right lower quadrant drainage catheter in place. No residual fluid collection around the drain or in the adjacent abdomen or pelvis. 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 Drainage catheter left in place. 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 Gail     5. C. difficile colitis 05/10/2015, Tober 2019   6.  Right leg edema 12/18/2015.  Negative venous Doppler.   7.  Renal insufficiency   8.  Urinary retention June 2022-temporary Foley catheter placed, started on Flomax    9.  B12 deficiency-oral B12 initiated 12/15/2021   10.  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 appears stable.  There is no clinical evidence for progression of the gastrointestinal stromal tumor.  He is tolerating the Gleevec  well.  He has stable renal function and mild anemia.  He will continue Gleevec .  [name] will return for an office and lab visit in 3 months. He received an influenza vaccine today.  Arley Hof, MD  09/17/2024  11:15 AM

## 2024-09-19 ENCOUNTER — Other Ambulatory Visit: Payer: Self-pay

## 2024-09-24 ENCOUNTER — Other Ambulatory Visit: Payer: Self-pay

## 2024-09-24 NOTE — Progress Notes (Signed)
 Specialty Pharmacy Refill Coordination Note  [name] [name] is a 89 y.o. male contacted today regarding refills of specialty medication(s) Imatinib  Mesylate (GLEEVEC )   Patient requested Delivery   Delivery date: 09/30/24   Verified address: [address]   [name] [address]   Medication will be filled on 09/27/24.

## 2024-09-25 ENCOUNTER — Other Ambulatory Visit (HOSPITAL_COMMUNITY): Payer: Self-pay

## 2024-09-25 ENCOUNTER — Other Ambulatory Visit: Payer: Self-pay

## 2024-09-26 ENCOUNTER — Other Ambulatory Visit: Payer: Self-pay

## 2024-09-27 ENCOUNTER — Other Ambulatory Visit (HOSPITAL_COMMUNITY): Payer: Self-pay

## 2024-09-27 ENCOUNTER — Other Ambulatory Visit: Payer: Self-pay

## 2024-09-27 NOTE — Progress Notes (Signed)
 Patient was notified there is a delay in delivery due to low inventory. Order has been placed and medication will be delivered on  10/01/24.

## 2024-09-30 ENCOUNTER — Other Ambulatory Visit: Payer: Self-pay

## 2024-10-17 ENCOUNTER — Other Ambulatory Visit: Payer: Self-pay

## 2024-10-17 NOTE — Progress Notes (Signed)
 Specialty Pharmacy Ongoing Clinical Assessment Note  [name] [name] is a 89 y.o. male who is being followed by the specialty pharmacy service for RxSp Oncology   Patient's specialty medication(s) reviewed today: Imatinib  Mesylate (GLEEVEC )   Missed doses in the last 4 weeks: 0   Patient/Caregiver did not have any additional questions or concerns.   Therapeutic benefit summary: Patient is achieving benefit   Adverse events/side effects summary: No adverse events/side effects   Patient's therapy is appropriate to: Continue    Goals Addressed             This Visit's Progress    Slow Disease Progression   On track    Patient is on track. Patient will maintain adherence. Per chart note from 9/30, no clinical evidence for progression of the GIST         Follow up: 6 months  [facility] Specialty Pharmacist

## 2024-10-17 NOTE — Progress Notes (Signed)
 Specialty Pharmacy Refill Coordination Note  [name] [name] is a 89 y.o. male contacted today regarding refills of specialty medication(s) Imatinib  Mesylate (GLEEVEC )   Patient requested Delivery   Delivery date: 10/25/24   Verified address: [address]   [place] [address]   Medication will be filled on: No data recorded

## 2024-10-21 ENCOUNTER — Other Ambulatory Visit: Payer: Self-pay | Admitting: Oncology

## 2024-10-21 DIAGNOSIS — C49A3 Gastrointestinal stromal tumor of small intestine: Secondary | ICD-10-CM

## 2024-10-21 DIAGNOSIS — J90 Pleural effusion, not elsewhere classified: Secondary | ICD-10-CM

## 2024-10-24 ENCOUNTER — Other Ambulatory Visit: Payer: Self-pay

## 2024-10-28 ENCOUNTER — Other Ambulatory Visit (HOSPITAL_COMMUNITY): Payer: Self-pay

## 2024-11-15 ENCOUNTER — Other Ambulatory Visit (HOSPITAL_COMMUNITY): Payer: Self-pay

## 2024-11-19 ENCOUNTER — Other Ambulatory Visit: Payer: Self-pay

## 2024-11-19 ENCOUNTER — Other Ambulatory Visit (HOSPITAL_COMMUNITY): Payer: Self-pay

## 2024-11-21 ENCOUNTER — Other Ambulatory Visit: Payer: Self-pay

## 2024-11-21 NOTE — Progress Notes (Signed)
 Specialty Pharmacy Refill Coordination Note  [name] [name] is a 89 y.o. male contacted today regarding refills of specialty medication(s) Imatinib  Mesylate (GLEEVEC )   Patient requested Delivery   Delivery date: 11/25/24   Verified address: [address]   [name] [address]   Medication will be filled on: 11/22/24

## 2024-11-25 ENCOUNTER — Telehealth: Payer: Self-pay | Admitting: Oncology

## 2024-11-25 NOTE — Telephone Encounter (Signed)
 Called Pt to reschedule appt, left detailed message.

## 2024-11-28 ENCOUNTER — Other Ambulatory Visit: Payer: Self-pay | Admitting: Cardiovascular Disease

## 2024-11-28 ENCOUNTER — Other Ambulatory Visit: Payer: Self-pay

## 2024-12-11 ENCOUNTER — Other Ambulatory Visit: Payer: Self-pay | Admitting: Oncology

## 2024-12-11 ENCOUNTER — Other Ambulatory Visit: Payer: Self-pay

## 2024-12-11 DIAGNOSIS — C49A3 Gastrointestinal stromal tumor of small intestine: Secondary | ICD-10-CM

## 2024-12-13 ENCOUNTER — Other Ambulatory Visit (HOSPITAL_COMMUNITY): Payer: Self-pay

## 2024-12-13 ENCOUNTER — Other Ambulatory Visit: Payer: Self-pay | Admitting: Oncology

## 2024-12-13 DIAGNOSIS — C49A3 Gastrointestinal stromal tumor of small intestine: Secondary | ICD-10-CM

## 2024-12-16 MED ORDER — IMATINIB MESYLATE 400 MG PO TABS
400.0000 mg | ORAL_TABLET | ORAL | 1 refills | Status: AC
  Filled 2024-12-17: qty 30, 60d supply, fill #0
  Filled 2024-12-17: qty 15, 30d supply, fill #0
  Filled 2025-01-15: qty 15, 30d supply, fill #1

## 2024-12-17 ENCOUNTER — Other Ambulatory Visit: Payer: Self-pay

## 2024-12-17 ENCOUNTER — Other Ambulatory Visit (HOSPITAL_COMMUNITY): Payer: Self-pay

## 2024-12-17 NOTE — Progress Notes (Signed)
 Specialty Pharmacy Refill Coordination Note  Spoke with Lander Eslick is a 89 y.o. male contacted today regarding refills of specialty medication(s) Imatinib  Mesylate (GLEEVEC )  Doses on hand: 16 days (8 tabs)  Patient requested: Delivery   Delivery date: 12/24/24   Verified address: [address]  [place] [address]  Medication will be filled on 12/23/24

## 2024-12-23 ENCOUNTER — Other Ambulatory Visit: Payer: Self-pay

## 2024-12-24 ENCOUNTER — Other Ambulatory Visit

## 2024-12-24 ENCOUNTER — Ambulatory Visit: Admitting: Oncology

## 2025-01-07 ENCOUNTER — Inpatient Hospital Stay: Attending: Oncology

## 2025-01-07 ENCOUNTER — Ambulatory Visit: Payer: Self-pay | Admitting: Oncology

## 2025-01-07 ENCOUNTER — Inpatient Hospital Stay: Admitting: Oncology

## 2025-01-07 ENCOUNTER — Encounter (HOSPITAL_BASED_OUTPATIENT_CLINIC_OR_DEPARTMENT_OTHER): Payer: Self-pay

## 2025-01-07 VITALS — BP 144/91 | HR 102 | Temp 98.0°F | Resp 18 | Ht 67.0 in | Wt 162.8 lb

## 2025-01-07 DIAGNOSIS — N289 Disorder of kidney and ureter, unspecified: Secondary | ICD-10-CM | POA: Insufficient documentation

## 2025-01-07 DIAGNOSIS — D638 Anemia in other chronic diseases classified elsewhere: Secondary | ICD-10-CM | POA: Insufficient documentation

## 2025-01-07 DIAGNOSIS — E538 Deficiency of other specified B group vitamins: Secondary | ICD-10-CM | POA: Insufficient documentation

## 2025-01-07 DIAGNOSIS — C49A3 Gastrointestinal stromal tumor of small intestine: Secondary | ICD-10-CM | POA: Insufficient documentation

## 2025-01-07 DIAGNOSIS — C786 Secondary malignant neoplasm of retroperitoneum and peritoneum: Secondary | ICD-10-CM | POA: Diagnosis not present

## 2025-01-07 DIAGNOSIS — Z8619 Personal history of other infectious and parasitic diseases: Secondary | ICD-10-CM | POA: Diagnosis not present

## 2025-01-07 DIAGNOSIS — I252 Old myocardial infarction: Secondary | ICD-10-CM | POA: Diagnosis not present

## 2025-01-07 LAB — CMP (CANCER CENTER ONLY)
ALT: 17 U/L (ref 0–44)
AST: 24 U/L (ref 15–41)
Albumin: 4.4 g/dL (ref 3.5–5.0)
Alkaline Phosphatase: 61 U/L (ref 38–126)
Anion gap: 10 (ref 5–15)
BUN: 28 mg/dL — ABNORMAL HIGH (ref 8–23)
CO2: 29 mmol/L (ref 22–32)
Calcium: 9.3 mg/dL (ref 8.9–10.3)
Chloride: 104 mmol/L (ref 98–111)
Creatinine: 1.88 mg/dL — ABNORMAL HIGH (ref 0.61–1.24)
GFR, Estimated: 33 mL/min — ABNORMAL LOW
Glucose, Bld: 117 mg/dL — ABNORMAL HIGH (ref 70–99)
Potassium: 4.1 mmol/L (ref 3.5–5.1)
Sodium: 143 mmol/L (ref 135–145)
Total Bilirubin: 0.3 mg/dL (ref 0.0–1.2)
Total Protein: 7.1 g/dL (ref 6.5–8.1)

## 2025-01-07 LAB — CBC WITH DIFFERENTIAL (CANCER CENTER ONLY)
Abs Immature Granulocytes: 0.01 K/uL (ref 0.00–0.07)
Basophils Absolute: 0 K/uL (ref 0.0–0.1)
Basophils Relative: 0 %
Eosinophils Absolute: 0.1 K/uL (ref 0.0–0.5)
Eosinophils Relative: 2 %
HCT: 36.3 % — ABNORMAL LOW (ref 39.0–52.0)
Hemoglobin: 12 g/dL — ABNORMAL LOW (ref 13.0–17.0)
Immature Granulocytes: 0 %
Lymphocytes Relative: 27 %
Lymphs Abs: 1.6 K/uL (ref 0.7–4.0)
MCH: 34.3 pg — ABNORMAL HIGH (ref 26.0–34.0)
MCHC: 33.1 g/dL (ref 30.0–36.0)
MCV: 103.7 fL — ABNORMAL HIGH (ref 80.0–100.0)
Monocytes Absolute: 0.5 K/uL (ref 0.1–1.0)
Monocytes Relative: 8 %
Neutro Abs: 3.7 K/uL (ref 1.7–7.7)
Neutrophils Relative %: 63 %
Platelet Count: 209 K/uL (ref 150–400)
RBC: 3.5 MIL/uL — ABNORMAL LOW (ref 4.22–5.81)
RDW: 12.3 % (ref 11.5–15.5)
WBC Count: 6 K/uL (ref 4.0–10.5)
nRBC: 0 % (ref 0.0–0.2)

## 2025-01-07 NOTE — Progress Notes (Signed)
 " [name] OFFICE PROGRESS NOTE   Diagnosis: Gastrointestinal stromal tumor  INTERVAL HISTORY:   [name] [name] returns as scheduled.  He continues Gleevec .  No rash or diarrhea.  Good appetite.  He had a sinus headache last night.  He took Advil.  Objective:  Vital signs in last 24 hours:  Blood pressure (!) 144/91, pulse (!) 102, temperature 98 F (36.7 C), resp. rate 18, height 5' 7 (1.702 m), weight 162 lb 12.8 oz (73.8 kg), SpO2 100%.    HEENT: Mild white coat over the tongue Lymphatics: No cervical, supraclavicular, axillary, or inguinal nodes Resp: Lungs clear bilaterally Cardio: Irregular GI: No hepatosplenomegaly, nontender, no mass, right inguinal hernia Vascular: No leg edema   Lab Results:  Lab Results  Component Value Date   WBC 6.0 01/07/2025   HGB 12.0 (L) 01/07/2025   HCT 36.3 (L) 01/07/2025   MCV 103.7 (H) 01/07/2025   PLT 209 01/07/2025   NEUTROABS 3.7 01/07/2025    CMP  Lab Results  Component Value Date   NA 144 09/17/2024   K 4.1 09/17/2024   CL 106 09/17/2024   CO2 26 09/17/2024   GLUCOSE 102 (H) 09/17/2024   BUN 26 (H) 09/17/2024   CREATININE 1.79 (H) 09/17/2024   CALCIUM  9.5 09/17/2024   PROT 7.4 09/17/2024   ALBUMIN 4.4 09/17/2024   AST 27 09/17/2024   ALT 16 09/17/2024   ALKPHOS 73 09/17/2024   BILITOT 0.4 09/17/2024   GFRNONAA 35 (L) 09/17/2024   GFRAA 51 (L) 08/05/2020    No results found for: CEA1, CEA, CAN199, CA125  Lab Results  Component Value Date   INR 1.17 07/26/2015   LABPROT 15.1 07/26/2015    Imaging:  No results found.  Medications: I have reviewed the patient's current medications.   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 CT 10/16/2014 consistent with extensive carcinomatosis, CT-guided biopsy of an omental mass 10/21/2014 confirmed a gastrointestinal stromal tumor Initiation of Gleevec  10/31/2014 CT 01/13/2015  revealed improvement in the peritoneal and omental metastatic disease CT 05/05/2015 with no progression of omental/peritoneal metastatic disease, increased ascites, and appendix inflammation CT 07/07/2016-no abscess, mild perihepatic and left pericolic ascites and mesenteric edema CT of the pelvis 09/20/2016, no residual abscess, no ascites CT abdomen pelvis renal stones study 04/03/2017-partial small bowel obstruction, potentially related to right inguinal hernia CT abdomen/pelvis 08/18/2017-possible cholecystitis, 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  Cholelithiasis. CT renal stone study 06/04/2021-large right and small left inguinal hernias, no ascites CTs 11/22/2022-small bilateral pleural effusions small volume ascites, gallstones, large right inguinal hernia, borderline enlarged mediastinal nodes Gleevec  400 mg every other day beginning 12/22/2022 due to edema and pleural effusions CT abdomen/pelvis 03/31/2024-few new small omental nodules with a left lateral omental nodule measuring up to 1.6 cm.,  New groundglass right lower lobe nodule     2. History of anemia, status post a nondiagnostic bone marrow biopsy 10/21/2014   3. NSTEMI March 2014   4. Admission 05/06/2015 with acute onset right abdomen pain-potentially related to acute appendicitis versus pain from carcinomatosis CT 05/13/2015 consistent with a right lower abdomen abscess, status post catheter drainage by interventional radiology 05/13/2015 Follow-up CT 05/19/2015 showed resolution  of the abscess. Follow-up evaluation in interventional radiology 06/01/2015 showed the abscess cavity had  resolved. The abscess drainage catheter communicated with the cecum via the appendix. The catheter was left in place. CT abdomen/pelvis 06/08/2015 showed the right pelvic drain in place without recurrent or residual surrounding fluid collection. Peritoneal metastasis with slight increase in small to moderate volume of abdominal pelvic ascites. Drainage catheter injection 06/12/2015 showed residual tiny fistulous connection with the end of the decompressed abscess cavity within the right lower abdominal quadrant and the residual lumen of the appendix. Paracentesis 06/12/2015 with 5 liters of fluid removed Paracentesis 06/19/2015-negative cytology and culture Removal of abscess drainage catheter 06/23/2015 CT 07/26/2015 with increased right abdominal wall inflammation and increased fluid collection at site of previous right abdomen abscess, ascites, and enlargement/inflammation of the appendix Placement of right lower quadrant percutaneous drainage catheter 07/27/2015. Contrast injection confirmed persistent fistulous connection with the ill-defined fluid collection and the residual appendix and cecum. Paracentesis with 2 L of fluid removed. Follow-up CT abdomen/pelvis 08/05/2015 with small to moderate left pleural effusion and moderate to large volume ascites with both appearing slightly decreased since the prior study. Right lower quadrant drainage catheter in place. No residual fluid collection around the drain or in the adjacent abdomen or pelvis. 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 Drainage catheter left in place. Tube evaluation 02/10/2016, persistent fistula Tube evaluation 03/01/2016, continued fistula to the cecum Tube downsized 05/04/2016, no residual abscess seen Collapsed abscess with a stable fistula to the cecum on imaging 06/16/2016 Tube removed 07/26/2016 Recurrent drainage from the right lower quadrant tube site August 2018, status  post antibiotics with improvement, drainage again November 2018-antibiotic prescribed by Dr. Gail     5. C. difficile colitis 05/10/2015, Tober 2019   6.  Right leg edema 12/18/2015. Negative venous Doppler.   7.  Renal insufficiency   8.  Urinary retention June 2022-temporary Foley catheter placed, started on Flomax    9.  B12 deficiency-oral B12 initiated 12/15/2021   10.  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 Welz appears stable.  He is tolerating the Gleevec  well.  There is no clinical evidence for progression of the gastrointestinal stromal tumor.  The hemoglobin remains improved. He has tachycardia with an irregular rhythm today.  We will check an EKG to be sure he has not developed atrial fibrillation.  He will return for an office and lab visit in 3 months.  Arley Hof, MD  01/07/2025  9:57 AM   "

## 2025-01-08 ENCOUNTER — Encounter: Payer: Self-pay | Admitting: Cardiovascular Disease

## 2025-01-08 ENCOUNTER — Other Ambulatory Visit: Payer: Self-pay

## 2025-01-08 ENCOUNTER — Encounter: Payer: Self-pay | Admitting: Oncology

## 2025-01-08 ENCOUNTER — Ambulatory Visit: Attending: Cardiovascular Disease | Admitting: Cardiovascular Disease

## 2025-01-08 ENCOUNTER — Other Ambulatory Visit (HOSPITAL_COMMUNITY): Payer: Self-pay

## 2025-01-08 VITALS — BP 146/80 | HR 78 | Ht 67.0 in | Wt 162.5 lb

## 2025-01-08 DIAGNOSIS — I5033 Acute on chronic diastolic (congestive) heart failure: Secondary | ICD-10-CM | POA: Diagnosis not present

## 2025-01-08 DIAGNOSIS — C49A3 Gastrointestinal stromal tumor of small intestine: Secondary | ICD-10-CM

## 2025-01-08 DIAGNOSIS — I483 Typical atrial flutter: Secondary | ICD-10-CM | POA: Insufficient documentation

## 2025-01-08 DIAGNOSIS — I214 Non-ST elevation (NSTEMI) myocardial infarction: Secondary | ICD-10-CM | POA: Diagnosis not present

## 2025-01-08 DIAGNOSIS — E782 Mixed hyperlipidemia: Secondary | ICD-10-CM | POA: Diagnosis not present

## 2025-01-08 DIAGNOSIS — I10 Essential (primary) hypertension: Secondary | ICD-10-CM | POA: Insufficient documentation

## 2025-01-08 DIAGNOSIS — I4892 Unspecified atrial flutter: Secondary | ICD-10-CM | POA: Insufficient documentation

## 2025-01-08 LAB — MAGNESIUM: Magnesium: 2.3 mg/dL (ref 1.7–2.4)

## 2025-01-08 MED ORDER — APIXABAN 2.5 MG PO TABS: 2.5000 mg | ORAL_TABLET | Freq: Two times a day (BID) | ORAL | 1 refills | Status: AC

## 2025-01-08 MED ORDER — APIXABAN 2.5 MG PO TABS
2.5000 mg | ORAL_TABLET | Freq: Two times a day (BID) | ORAL | 0 refills | Status: DC
  Filled 2025-01-08: qty 60, 30d supply, fill #0

## 2025-01-08 NOTE — Assessment & Plan Note (Signed)
 Newly recognized atrial flutter with controlled ventricular response.  He is asymptomatic from this.  Given his age and renal function as well as a CV risk of 3 I am going to start him on reduced dosed Eliquis  for stroke prophylaxis.

## 2025-01-08 NOTE — Assessment & Plan Note (Signed)
 History of hyperlipidemia on simvastatin  with lipid profile performed 08/13/2024 revealing total cholesterol 164, LDL of 94 and HDL 46.

## 2025-01-08 NOTE — Progress Notes (Signed)
 "     01/08/2025 Job Holtsclaw   20-Sep-1929  [id number]  Primary Physician Nanci Senior, MD Primary Cardiologist: Dorn JINNY Lesches MD GENI CODY MADEIRA, FSCAI  HPI:  [name] [name] is a 89 y.o.  mildly overweight married Caucasian male father of 2 children, grandfather 4 grandchildren is accompanied by his son Garrel today.SABRA  He was referred by the emergency room who we saw last night because of heart failure.  I last saw him in the office/8/25.  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  He lives independently with his wife.  He does have a history of treated hypertension and hyperlipidemia.  He is never had a stroke.  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 Beula on chemotherapy.  He is known CAD status post remote cath revealing an occluded LAD with bidirectional collaterals medically treated.  His last echo performed 09/27/2020 revealed normal LV systolic function without valvular abnormalities.  He was seen in the ER last night with volume overload, shortness of breath and dyspnea.  His BNP was 800.  Chest x-ray showed bilateral pleural effusions.  His serum creatinine was in the high 1 range.  He was given IV Lasix  40 mg and diuresed 1 L.  He felt clinically improved.   Since I saw him in the office 8 months ago he has remained stable.  He appears to be euvolemic on furosemide .  He denies chest pain or shortness of breath.  He is still active and exercises on a daily basis and still drives.  He was seen yesterday by his oncologist who noted an irregular rhythm got an EKG that showed atrial flutter with a controlled ventricular response.  He is unaware of this and is asymptomatic from it.   Active Medications[1]   Allergies[2]  Social History   Socioeconomic History   Marital status: Married    Spouse name: Cathlean   Number of children: 2   Years of education: Not on file   Highest education level: Not on file  Occupational History   Occupation: Retired     Comment: Insurance claims handler  Tobacco Use   Smoking status: Never   Smokeless tobacco: Never  Vaping Use   Vaping status: Never Used  Substance and Sexual Activity   Alcohol use: No   Drug use: No   Sexual activity: Not Currently  Other Topics Concern   Not on file  Social History Narrative   Married, wife Cathlean   Retired neurosurgeon for 40 years   Independent in [place]   Drives   #2 grown sons   Social Drivers of Health   Tobacco Use: Low Risk (01/08/2025)   Patient History    Smoking Tobacco Use: Never    Smokeless Tobacco Use: Never    Passive Exposure: Not on file  Financial Resource Strain: Not on file  Food Insecurity: Not on file  Transportation Needs: Not on file  Physical Activity: Not on file  Stress: Not on file  Social Connections: Not on file  Intimate Partner Violence: Not on file  Depression (EYV7-0): Low Risk (01/07/2025)   Depression (PHQ2-9)    PHQ-2 Score: 0  Alcohol Screen: Not on file  Housing: Not on file  Utilities: Not on file  Health Literacy: Not on file     Review of Systems: General: negative for chills, fever, night sweats or weight changes.  Cardiovascular: negative for chest pain, dyspnea on exertion, edema, orthopnea, palpitations, paroxysmal  nocturnal dyspnea or shortness of breath Dermatological: negative for rash Respiratory: negative for cough or wheezing Urologic: negative for hematuria Abdominal: negative for nausea, vomiting, diarrhea, bright red blood per rectum, melena, or hematemesis Neurologic: negative for visual changes, syncope, or dizziness All other systems reviewed and are otherwise negative except as noted above.    Blood pressure (!) 146/80, pulse 78, height 5' 7 (1.702 m), weight 162 lb 8 oz (73.7 kg), SpO2 96%.  General appearance: alert and no distress Neck: no adenopathy, no carotid bruit, no JVD, supple, symmetrical, trachea midline, and thyroid not enlarged, symmetric, no tenderness/mass/nodules Lungs:  clear to auscultation bilaterally Heart: irregularly irregular rhythm Extremities: extremities normal, atraumatic, no cyanosis or edema Pulses: 2+ and symmetric Skin: Skin color, texture, turgor normal. No rashes or lesions Neurologic: Grossly normal  EKG not performed today      ASSESSMENT AND PLAN:   Hypertension History of essential hypertension blood pressure measured today at 146/80.  He is on amlodipine , and carvedilol .  Diastolic CHF, acute on chronic (HCC) History of diastolic heart failure on diuretics.  He is euvolemic on exam today.  NSTEMI (non-ST elevated myocardial infarction) (HCC) History of CAD status post remote cath revealing occluded LAD with bidirectional collaterals treated medically.  He denies chest pain.  Hyperlipidemia History of hyperlipidemia on simvastatin  with lipid profile performed 08/13/2024 revealing total cholesterol 164, LDL of 94 and HDL 46.  Atrial flutter (HCC) Newly recognized atrial flutter with controlled ventricular response.  He is asymptomatic from this.  Given his age and renal function as well as a CV risk of 3 I am going to start him on reduced dosed Eliquis  for stroke prophylaxis.     Dorn DOROTHA Lesches MD FACP,FACC,FAHA, FSCAI 01/08/2025 2:32 PM    [1]  Current Meds  Medication Sig   amLODipine  (NORVASC ) 10 MG tablet Take 10 mg by mouth daily.   carvedilol  (COREG ) 3.125 MG tablet Take 1 tablet by mouth twice daily   furosemide  (LASIX ) 40 MG tablet Take 1 tablet by mouth once daily   imatinib  (GLEEVEC ) 400 MG tablet Take 1 tablet (400 mg total) by mouth every other day. Take with meals and large glass of water.Caution:Chemotherapy.   loperamide  (IMODIUM ) 2 MG capsule Take 1 capsule (2 mg total) by mouth 4 (four) times daily as needed for diarrhea or loose stools.   potassium chloride  SA (KLOR-CON  M) 20 MEQ tablet Take 1 tablet by mouth daily   simvastatin  (ZOCOR ) 20 MG tablet Take 20 mg by mouth daily.  [2] No Known  Allergies  "

## 2025-01-08 NOTE — Patient Instructions (Signed)
 Medication Instructions:  Your physician has recommended you make the following change in your medication:   -Start apixaban  (Eliquis ) 2.5mg  twice daily.  *If you need a refill on your cardiac medications before your next appointment, please call your pharmacy*   Follow-Up: At [facility], you and your health needs are our priority.  As part of our continuing mission to provide you with exceptional heart care, our providers are all part of one team.  This team includes your primary Cardiologist (physician) and Advanced Practice Providers or APPs (Physician Assistants and Nurse Practitioners) who all work together to provide you with the care you need, when you need it.  Your next appointment:   3 month(s)  Provider:   Lum Louis, NP, Aline Door, PA-C, Kathleen Johnson, PA-C, Damien Braver, NP, or Katlyn West, NP         Then, Dorn Lesches, MD will plan to see you again in 6 month(s).   We recommend signing up for the patient portal called MyChart.  Sign up information is provided on this After Visit Summary.  MyChart is used to connect with patients for Virtual Visits (Telemedicine).  Patients are able to view lab/test results, encounter notes, upcoming appointments, etc.  Non-urgent messages can be sent to your provider as well.   To learn more about what you can do with MyChart, go to [email or web address].   Other Instructions

## 2025-01-08 NOTE — Assessment & Plan Note (Signed)
 History of CAD status post remote cath revealing occluded LAD with bidirectional collaterals treated medically.  He denies chest pain.

## 2025-01-08 NOTE — Assessment & Plan Note (Signed)
 History of essential hypertension blood pressure measured today at 146/80.  He is on amlodipine , and carvedilol .

## 2025-01-08 NOTE — Assessment & Plan Note (Signed)
 History of diastolic heart failure on diuretics.  He is euvolemic on exam today.

## 2025-01-15 ENCOUNTER — Other Ambulatory Visit: Payer: Self-pay

## 2025-01-15 ENCOUNTER — Other Ambulatory Visit (HOSPITAL_COMMUNITY): Payer: Self-pay

## 2025-01-17 ENCOUNTER — Other Ambulatory Visit: Payer: Self-pay

## 2025-01-17 ENCOUNTER — Other Ambulatory Visit: Payer: Self-pay | Admitting: Pharmacy Technician

## 2025-01-17 NOTE — Progress Notes (Signed)
 Specialty Pharmacy Refill Coordination Note  Geoffery Aultman is a 89 y.o. male contacted today regarding refills of specialty medication(s) Imatinib  Mesylate (GLEEVEC )   Patient requested Delivery   Delivery date: 01/23/25   Verified address: [address]  [name] [address]   Medication will be filled on: 01/22/25

## 2025-01-22 ENCOUNTER — Other Ambulatory Visit: Payer: Self-pay

## 2025-04-08 ENCOUNTER — Inpatient Hospital Stay: Admitting: Oncology

## 2025-04-08 ENCOUNTER — Inpatient Hospital Stay

## 2025-04-09 ENCOUNTER — Ambulatory Visit: Admitting: Cardiovascular Disease
